# Patient Record
Sex: Female | Born: 1955 | Race: Black or African American | Hispanic: No | Marital: Married | Smoking: Former smoker
Health system: Southern US, Community
[De-identification: ages and names within clinical notes are randomized; demographics above are authoritative.]

## PROBLEM LIST (undated history)

## (undated) DIAGNOSIS — K219 Gastro-esophageal reflux disease without esophagitis: Secondary | ICD-10-CM

## (undated) DIAGNOSIS — F329 Major depressive disorder, single episode, unspecified: Secondary | ICD-10-CM

## (undated) DIAGNOSIS — IMO0001 Reserved for inherently not codable concepts without codable children: Secondary | ICD-10-CM

## (undated) DIAGNOSIS — Z9981 Dependence on supplemental oxygen: Secondary | ICD-10-CM

## (undated) DIAGNOSIS — E785 Hyperlipidemia, unspecified: Secondary | ICD-10-CM

## (undated) DIAGNOSIS — T7840XA Allergy, unspecified, initial encounter: Secondary | ICD-10-CM

## (undated) DIAGNOSIS — F32A Depression, unspecified: Secondary | ICD-10-CM

## (undated) DIAGNOSIS — I8390 Asymptomatic varicose veins of unspecified lower extremity: Secondary | ICD-10-CM

## (undated) HISTORY — DX: Major depressive disorder, single episode, unspecified: F32.9

## (undated) HISTORY — DX: Allergy, unspecified, initial encounter: T78.40XA

## (undated) HISTORY — PX: TUBAL LIGATION: SHX77

## (undated) HISTORY — DX: Depression, unspecified: F32.A

## (undated) HISTORY — DX: Asymptomatic varicose veins of unspecified lower extremity: I83.90

## (undated) HISTORY — DX: Hyperlipidemia, unspecified: E78.5

---

## 2012-11-24 ENCOUNTER — Other Ambulatory Visit: Payer: Self-pay | Admitting: *Deleted

## 2012-11-24 DIAGNOSIS — R609 Edema, unspecified: Secondary | ICD-10-CM

## 2012-11-24 DIAGNOSIS — I803 Phlebitis and thrombophlebitis of lower extremities, unspecified: Secondary | ICD-10-CM

## 2012-11-30 ENCOUNTER — Encounter: Payer: Self-pay | Admitting: Vascular Surgery

## 2012-12-24 ENCOUNTER — Encounter: Payer: Self-pay | Admitting: Vascular Surgery

## 2012-12-25 ENCOUNTER — Ambulatory Visit (INDEPENDENT_AMBULATORY_CARE_PROVIDER_SITE_OTHER): Payer: PRIVATE HEALTH INSURANCE | Admitting: Vascular Surgery

## 2012-12-25 ENCOUNTER — Encounter (INDEPENDENT_AMBULATORY_CARE_PROVIDER_SITE_OTHER): Payer: PRIVATE HEALTH INSURANCE | Admitting: *Deleted

## 2012-12-25 ENCOUNTER — Encounter: Payer: Self-pay | Admitting: Vascular Surgery

## 2012-12-25 VITALS — BP 114/66 | HR 80 | Ht 67.0 in | Wt 276.8 lb

## 2012-12-25 DIAGNOSIS — I872 Venous insufficiency (chronic) (peripheral): Secondary | ICD-10-CM

## 2012-12-25 DIAGNOSIS — R609 Edema, unspecified: Secondary | ICD-10-CM

## 2012-12-25 DIAGNOSIS — I803 Phlebitis and thrombophlebitis of lower extremities, unspecified: Secondary | ICD-10-CM

## 2012-12-25 NOTE — Progress Notes (Signed)
[facility] [place]  Referred by:  Remus Loffler, PA [address] [place], [place] [address]  Reason for referral: Swollen bilateral leg  History of Present Illness  Kelly Donaldson is a 57 y.o. (1956-01-18) female who presents with chief complaint: swollen B leg.  Patient notes, onset of swelling years ago, associated with no obvious trigger.  The patient has had no history of DVT, known history of varicose vein, no history of venous stasis ulcers, no history of  Lymphedema and known history of skin changes in lower legs.  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  There is known family history of venous disorders: mother had same disease.  Pt has had multiple children in the past.  The patient has used knee high compression stockings in the past.  Past Medical History  Diagnosis Date  . Allergy   . Hyperlipidemia   . Depression   . Varicose veins     Past Surgical History  Procedure Date  . Tubal ligation     11/84    History   Social History  . Marital Status: Unknown    Spouse Name: N/A    Number of Children: N/A  . Years of Education: N/A   Occupational History  . Not on file.   Social History Main Topics  . Smoking status: Current Every Day Smoker -- 0.5 packs/day    Types: Cigarettes  . Smokeless tobacco: Never Used     Comment: pt states that she had quit smoking for 10 years and started back 2 years ago 2012  . Alcohol Use: No  . 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 Sexually Active: Not on file   Other Topics Concern  . Not on file   Social History Narrative  . No narrative on file    Family History  Problem Relation Age of Onset  . Hyperlipidemia Mother   . Hypertension Mother   . Diabetes Mother   . Other Mother     varicose veins  . Hyperlipidemia Father   . Hypertension Father   . Hypertension Daughter     Current Outpatient Prescriptions on File Prior to  Visit  Medication Sig Dispense Refill  . cetirizine (ZYRTEC) 10 MG tablet Take 10 mg by mouth daily.      . citalopram (CELEXA) 40 MG tablet Take 40 mg by mouth daily.      . fluticasone (FLONASE) 50 MCG/ACT nasal spray Place 2 sprays into the nose daily.      Marland Kitchen HYDROcodone-acetaminophen (VICODIN ES) 7.5-750 MG per tablet Take 1 tablet by mouth every 6 (six) hours as needed.      . meloxicam (MOBIC) 7.5 MG tablet Take 7.5 mg by mouth daily.      . phentermine 37.5 MG capsule Take 37.5 mg by mouth every morning.      . simvastatin (ZOCOR) 40 MG tablet Take 40 mg by mouth every evening.      . triamcinolone ointment (KENALOG) 0.1 % Apply topically 2 (two) times daily.      . Vitamin D, Ergocalciferol, (DRISDOL) 50000 UNITS CAPS Take 50,000 Units by mouth every 7 (seven) days.      Marland Kitchen azelastine (ASTELIN) 137 MCG/SPRAY nasal spray       . furosemide (LASIX) 20 MG tablet       . ipratropium-albuterol (DUONEB) 0.5-2.5 (3) MG/3ML SOLN       .  traZODone (DESYREL) 100 MG tablet         No Known Allergies  REVIEW OF SYSTEMS:  (Positives checked otherwise negative)  CARDIOVASCULAR:  [ ]  chest pain, [ ]  chest pressure, [ ]  palpitations, [ ]  shortness of breath when laying flat, [ ]  shortness of breath with exertion,   [ ]  pain in feet when walking, [ ]  pain in feet when laying flat, [ ]  history of blood clot in veins (DVT), [x]  history of phlebitis, [x]  swelling in legs, [x]  varicose veins  PULMONARY:  [ ]  productive cough, [ ]  asthma, [ ]  wheezing  NEUROLOGIC:  [ ]  weakness in arms or legs, [ ]  numbness in arms or legs, [ ]  difficulty speaking or slurred speech, [ ]  temporary loss of vision in one eye, [ ]  dizziness  HEMATOLOGIC:  [ ]  bleeding problems, [ ]  problems with blood clotting too easily  MUSCULOSKEL:  [ ]  joint pain, [ ]  joint swelling  GASTROINTEST:  [ ]   Vomiting blood, [ ]   Blood in stool     GENITOURINARY:  [ ]   Burning with urination, [ ]   Blood in urine  PSYCHIATRIC:  [ ]   history of major depression  INTEGUMENTARY:  [ ]  rashes, [ ]  ulcers  CONSTITUTIONAL:  [ ]  fever, [ ]  chills  Physical Examination  Filed Vitals:   12/25/12 1459  BP: 114/66  Pulse: 80  Height: 5\' 7"  (1.702 m)  Weight: 276 lb 12.8 oz (125.556 kg)  SpO2: 96%   Body mass index is 43.35 kg/(m^2).  General: A&O x 3, WD, obese  Head: [place]/AT  Ear/Nose/Throat: Hearing grossly intact, nares w/o erythema or drainage, oropharynx w/o Erythema/Exudate  Eyes: PERRLA, EOMI  Neck: Supple, no nuchal rigidity, no palpable LAD  Pulmonary: Sym exp, good air movt, CTAB, no rales, rhonchi, & wheezing  Cardiac: RRR, Nl S1, S2, no Murmurs, rubs or gallops  Vascular: Vessel Right Left  Radial Palpable Palpable  Ulnar Palpable Palpable  Brachial Palpable Palpable  Carotid Palpable, without bruit Palpable, without bruit  Aorta Not palpable N/A  Femoral Palpable Palpable  Popliteal Not palpable Not palpable  PT Palpable Palpable  DP Palpable Palpable   Gastrointestinal: soft, NTND, -G/R, - HSM, - masses, - CVAT B  Musculoskeletal: M/S 5/5 throughout , Extremities without ischemic changes , bilateral LDS: R>L, bilateral prominent varicosities  Neurologic: CN 2-12 intact , Pain and light touch intact in extremities , Motor exam as listed above  Psychiatric: Judgment intact, Mood & affect appropriate for pt's clinical situation  Dermatologic: See M/S exam for extremity exam, no rashes otherwise noted  Lymph : No Cervical, Axillary, or Inguinal lymphadenopathy   Non-Invasive Vascular Imaging  BLE Venous Insufficiency Duplex (Date: 12/25/12):   RLE: No GSV reflux, ASV proliferation, significant deep reflux  LLE: No GSV reflux, ASV proliferation, significant deep reflux  Outside Studies/Documentation 5 pages of outside documents were reviewed including: outpatient clinic chart.  Medical Decision Making  Kelly Donaldson is a 57 y.o. female who presents with: CVI (C4).   Based on the  patient's history and examination, I recommend: B thigh compression stockings 20-30 mm Hg x 3 months  After 3 month trial, patient will need to follow up for evaluation in Vein Clinic for EVLA.  Stab phlebectomy of ASV in both leg may be necessary also.  Thank you for allowing [place] to participate in this patient's care.  Leonides Sake, MD [facility] [place] Office: [phone] Pager: [phone]  12/25/2012, 3:26 PM

## 2013-03-29 ENCOUNTER — Encounter: Payer: Self-pay | Admitting: Vascular Surgery

## 2013-03-30 ENCOUNTER — Ambulatory Visit: Payer: PRIVATE HEALTH INSURANCE | Admitting: Vascular Surgery

## 2013-05-10 ENCOUNTER — Encounter: Payer: Self-pay | Admitting: Vascular Surgery

## 2013-05-11 ENCOUNTER — Ambulatory Visit: Payer: PRIVATE HEALTH INSURANCE | Admitting: Vascular Surgery

## 2013-07-14 ENCOUNTER — Other Ambulatory Visit: Payer: Self-pay | Admitting: *Deleted

## 2013-07-14 DIAGNOSIS — I739 Peripheral vascular disease, unspecified: Secondary | ICD-10-CM

## 2013-07-26 ENCOUNTER — Encounter: Payer: Self-pay | Admitting: Vascular Surgery

## 2013-07-27 ENCOUNTER — Ambulatory Visit (INDEPENDENT_AMBULATORY_CARE_PROVIDER_SITE_OTHER): Payer: PRIVATE HEALTH INSURANCE | Admitting: Vascular Surgery

## 2013-07-27 ENCOUNTER — Encounter: Payer: Self-pay | Admitting: Vascular Surgery

## 2013-07-27 ENCOUNTER — Encounter (INDEPENDENT_AMBULATORY_CARE_PROVIDER_SITE_OTHER): Payer: PRIVATE HEALTH INSURANCE | Admitting: *Deleted

## 2013-07-27 VITALS — BP 128/72 | HR 86 | Resp 18 | Ht 67.0 in | Wt 265.9 lb

## 2013-07-27 DIAGNOSIS — I739 Peripheral vascular disease, unspecified: Secondary | ICD-10-CM

## 2013-07-27 DIAGNOSIS — I83893 Varicose veins of bilateral lower extremities with other complications: Secondary | ICD-10-CM | POA: Insufficient documentation

## 2013-07-27 NOTE — Progress Notes (Signed)
Problems with Activities of Daily Living Secondary to Leg Pain  1. Kelly Donaldson states she works as a Merchandiser, retail and her job requires her to stand for prolonged periods of time which is difficult due to leg pain.  2. Kelly Donaldson states that cooking, cleaning, shopping are difficult for her due to leg pain.       Failure of  Conservative Therapy:  1. Worn 20-30 mm Hg thigh high compression hose >3 months with no relief of symptoms.  2. Frequently elevates legs-no relief of symptoms  3. Taken Ibuprofen 600 Mg TID with no relief of symptoms.  The patient is here today for continued discussion of her severe chronic venous stasis disease. She had seen Dr. Imogene Burn in January of 2014. She is compliant with her compression garments. She continues to have severe pain in both legs. She does have a great deal of inflammation currently in her left leg particularly.  Past Medical History  Diagnosis Date  . Allergy   . Hyperlipidemia   . Depression   . Varicose veins     History  Substance Use Topics  . Smoking status: Current Every Day Smoker -- 0.50 packs/day    Types: Cigarettes  . Smokeless tobacco: Never Used     Comment: pt states that she had quit smoking for 10 years and started back 2 years ago 2012  . Alcohol Use: No    Family History  Problem Relation Age of Onset  . Hyperlipidemia Mother   . Hypertension Mother   . Diabetes Mother   . Other Mother     varicose veins  . Hyperlipidemia Father   . Hypertension Father   . Hypertension Daughter     No Known Allergies  Current outpatient prescriptions:azelastine (ASTELIN) 137 MCG/SPRAY nasal spray, , Disp: , Rfl: ;  cetirizine (ZYRTEC) 10 MG tablet, Take 10 mg by mouth daily., Disp: , Rfl: ;  citalopram (CELEXA) 40 MG tablet, Take 40 mg by mouth daily., Disp: , Rfl: ;  fluticasone (FLONASE) 50 MCG/ACT nasal spray, Place 2 sprays into the nose daily., Disp: , Rfl: ;  furosemide (LASIX) 20 MG tablet, , Disp: , Rfl:   HYDROcodone-acetaminophen (NORCO) 10-325 MG per tablet, , Disp: , Rfl: ;  ipratropium-albuterol (DUONEB) 0.5-2.5 (3) MG/3ML SOLN, , Disp: , Rfl: ;  meloxicam (MOBIC) 7.5 MG tablet, Take 7.5 mg by mouth daily., Disp: , Rfl: ;  phentermine 37.5 MG capsule, Take 37.5 mg by mouth every morning., Disp: , Rfl: ;  rifampin (RIFADIN) 300 MG capsule, , Disp: , Rfl: ;  simvastatin (ZOCOR) 40 MG tablet, Take 40 mg by mouth every evening., Disp: , Rfl:  sulfamethoxazole-trimethoprim (BACTRIM DS) 800-160 MG per tablet, , Disp: , Rfl: ;  traZODone (DESYREL) 100 MG tablet, , Disp: , Rfl: ;  triamcinolone ointment (KENALOG) 0.1 %, Apply topically 2 (two) times daily., Disp: , Rfl: ;  Vitamin D, Ergocalciferol, (DRISDOL) 50000 UNITS CAPS, Take 50,000 Units by mouth every 7 (seven) days., Disp: , Rfl:  HYDROcodone-acetaminophen (VICODIN ES) 7.5-750 MG per tablet, Take 1 tablet by mouth every 6 (six) hours as needed., Disp: , Rfl:   BP 128/72  Pulse 86  Resp 18  Ht 5\' 7"  (1.702 m)  Wt 265 lb 14.4 oz (120.611 kg)  BMI 41.64 kg/m2  Body mass index is 41.64 kg/(m^2).       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 There is  a great deal of darkening and thickening on her skin bilaterally. She does have erythema on her left calf area. He does have 2+ dorsalis pedis pulses bilaterally  I reviewed her formal venous duplex from January 2014 and reimage her veins with the SonoSite ultrasound myself. She does have scattered varicosities over both thighs. Unfortunately her saphenous vein is very small and not causing any significant venous hypertension.  Impression and plan severe bilateral venous hypertension and venous stasis changes related to severe deep venous reflux. I discussed this at great length with the patient and her husband present. I explained that there is no surgical or correction for her deep venous issues. I have any  critical importance of elevation with her legs above her heart whenever possible and critical importance of graduated compression. She is quite heavy and filled be very difficult for her to continue to wear thigh high compression. I feel that the knee high would be appropriate if she can wear these. Also explained the possibility of wrapping with Ace wrap if she is having too much inflammation to wear these. I did explain that this is not limb threatening. The patient's mother did have severe venous stasis disease and venous stasis ulcers which were chronic so she understands the nature of this. She is asking about disability from this. Certainly she would be benefited by elevation which could be very difficult for her to work a standard job with this.

## 2015-01-11 ENCOUNTER — Ambulatory Visit (INDEPENDENT_AMBULATORY_CARE_PROVIDER_SITE_OTHER): Payer: PRIVATE HEALTH INSURANCE | Admitting: Internal Medicine

## 2015-01-11 ENCOUNTER — Other Ambulatory Visit (INDEPENDENT_AMBULATORY_CARE_PROVIDER_SITE_OTHER): Payer: PRIVATE HEALTH INSURANCE

## 2015-01-11 ENCOUNTER — Ambulatory Visit (INDEPENDENT_AMBULATORY_CARE_PROVIDER_SITE_OTHER)
Admission: RE | Admit: 2015-01-11 | Discharge: 2015-01-11 | Disposition: A | Discharge status: Home / Self Care | Payer: PRIVATE HEALTH INSURANCE | Source: Ambulatory Visit | Attending: Internal Medicine | Admitting: Internal Medicine

## 2015-01-11 ENCOUNTER — Encounter: Payer: Self-pay | Admitting: Internal Medicine

## 2015-01-11 VITALS — BP 100/62 | HR 86 | Ht 68.0 in | Wt 259.0 lb

## 2015-01-11 DIAGNOSIS — Z720 Tobacco use: Secondary | ICD-10-CM | POA: Diagnosis not present

## 2015-01-11 DIAGNOSIS — R059 Cough, unspecified: Secondary | ICD-10-CM | POA: Insufficient documentation

## 2015-01-11 DIAGNOSIS — R05 Cough: Secondary | ICD-10-CM

## 2015-01-11 DIAGNOSIS — R0600 Dyspnea, unspecified: Secondary | ICD-10-CM | POA: Diagnosis not present

## 2015-01-11 DIAGNOSIS — R0902 Hypoxemia: Secondary | ICD-10-CM | POA: Diagnosis not present

## 2015-01-11 DIAGNOSIS — J9611 Chronic respiratory failure with hypoxia: Secondary | ICD-10-CM | POA: Insufficient documentation

## 2015-01-11 DIAGNOSIS — F17210 Nicotine dependence, cigarettes, uncomplicated: Secondary | ICD-10-CM | POA: Insufficient documentation

## 2015-01-11 LAB — BRAIN NATRIURETIC PEPTIDE: Pro B Natriuretic peptide (BNP): 69 pg/mL (ref 0.0–100.0)

## 2015-01-11 LAB — TSH: TSH: 1.51 u[IU]/mL (ref 0.35–4.50)

## 2015-01-11 NOTE — Assessment & Plan Note (Signed)

## 2015-01-11 NOTE — Assessment & Plan Note (Signed)
-   01/11/2015   Walked 2lpm  x one lap @ 185 stopped due to  Sob, nl pace, no desat  Not really clear why she is sob or on 02 at this point so the first step is to check her last admit to [facility] to see what studies were done but likely needds at least a full set of pfts and stop smoking

## 2015-01-11 NOTE — Assessment & Plan Note (Signed)
Adequate control on present rx, reviewed > no change in rx needed  > 2lpm with exertion and hs for now

## 2015-01-11 NOTE — Progress Notes (Signed)
Subjective:    Patient ID: Kelly Donaldson, female    DOB: July 08, 1956,   MRN: [id number]  HPI  37 yobf active smoker on noct 02 2lpm x 2013 and daytime doe x more than [employer] off 02 but losing ground gradually since onset  assoc with leg pain/ swelling = bilaterally already eval [facility] pulmonary doctor = Kelly Donaldson rec wear 02   01/11/2015 1st Kelly Donaldson   Chief Complaint  Patient presents with  . Pulmonary Consult    Referred by Dr. Charm Barges. Pt c/o DOE x 2 yrs worse x 3 months. She states DOE seems worse in the evenings. She gets out of breath walking up stairs or "doing too much". She also c/o cough- prod with clear sputum and has trouble swallowing often.   chronic doe x years much worse x 3 months ssoc with dry daytime cough and mild dysphagia/ also worse leg swelling just using 02 at hs  Prev w/u in [place] "neg" per pt, only rx= 02  No obvious   day to day or daytime variabilty or assoc  cp or chest tightness, subjective wheeze overt sinus or hb symptoms. No unusual exp hx or h/o childhood pna/ asthma or knowledge of premature birth.  Sleeping ok without nocturnal  or early am exacerbation  of respiratory  c/o's or need for noct saba. Also denies any obvious fluctuation of symptoms with weather or environmental changes or other aggravating or alleviating factors except as outlined above   Current Medications, Allergies, Complete Past Medical History, Past Surgical History, Family History, and Social History were reviewed in [employer] record.               Review of Systems  Constitutional: Negative for fever, chills and unexpected weight change.  HENT: Positive for congestion and trouble swallowing. Negative for dental problem, ear pain, nosebleeds, postnasal drip, rhinorrhea, sinus pressure, sneezing, sore throat and voice change.   Eyes: Negative for visual disturbance.  Respiratory: Positive for cough and shortness of  breath. Negative for choking.   Cardiovascular: Negative for chest pain and leg swelling.  Gastrointestinal: Negative for vomiting, abdominal pain and diarrhea.  Genitourinary: Negative for difficulty urinating.  Musculoskeletal: Positive for arthralgias.  Skin: Negative for rash.  Neurological: Negative for tremors, syncope and headaches.  Hematological: Does not bruise/bleed easily.       Objective:   Physical Exam  Obese amb bf nad  Wt Readings from Last 3 Encounters:  01/11/15 259 lb (117.482 kg)  07/27/13 265 lb 14.4 oz (120.611 kg)  12/25/12 276 lb 12.8 oz (125.556 kg)    Vital signs reviewed  HEENT: nl dentition, turbinates, and orophanx. Nl external ear canals without cough reflex   NECK :  without JVD/Nodes/TM/ nl carotid upstrokes bilaterally   LUNGS: no acc muscle use, clear to A and P bilaterally without cough on insp or exp maneuvers   CV:  RRR  no s3 or murmur or increase in P2,  1+ pitting bilateral lower ext edema   ABD:  soft and nontender with nl excursion in the supine position. No bruits or organomegaly, bowel sounds nl  MS:  warm without deformities, calf tenderness, cyanosis or clubbing  SKIN: warm and dry without lesions  Except for chronic venous stasis changes both legs   NEURO:  alert, approp, no deficits   Labs ordered today / images reviewed include:   Lab Results  Component Value Date   TSH 1.51 01/11/2015  Lab Results  Component Value Date   PROBNP 69.0 01/11/2015    cxr 01/11/2015 images reviewed Cardiomegaly/ PH ? ILD  ? Sarcoid   Labs 12/19/14  hct 39.9/  HC03 29     Assessment & Plan:

## 2015-01-11 NOTE — Assessment & Plan Note (Signed)
The most common causes of chronic cough in immunocompetent adults include the following: upper airway cough syndrome (UACS), previously referred to as postnasal drip syndrome (PNDS), which is caused by variety of rhinosinus conditions; (2) asthma; (3) GERD; (4) chronic bronchitis from cigarette smoking or other inhaled environmental irritants; (5) nonasthmatic eosinophilic bronchitis; and (6) bronchiectasis.   These conditions, singly or in combination, have accounted for up to 94% of the causes of chronic cough in prospective studies.   Other conditions have constituted no >6% of the causes in prospective studies These have included bronchogenic carcinoma, chronic interstitial pneumonia, sarcoidosis, left ventricular failure, ACEI-induced cough, and aspiration from a condition associated with pharyngeal dysfunction.    Chronic cough is often simultaneously caused by more than one condition. A single cause has been found from 38 to 82% of the time, multiple causes from 18 to 62%. Multiply caused cough has been the result of three diseases up to 42% of the time.       Based on hx and exam, this is most likely:  Classic Upper airway cough syndrome, so named because it's frequently impossible to sort out how much is  CR/sinusitis with freq throat clearing (which can be related to primary GERD)   vs  causing  secondary (" extra esophageal")  GERD from wide swings in gastric pressure that occur with throat clearing, often  promoting self use of mint and menthol lozenges that reduce the lower esophageal sphincter tone and exacerbate the problem further in a cyclical fashion.   These are the same pts (now being labeled as having "irritable larynx syndrome" by some cough centers) who not infrequently have a history of having failed to tolerate ace inhibitors,  dry powder inhalers or biphosphonates or report having atypical reflux symptoms that don't respond to standard doses of PPI , and are easily confused as  having aecopd or asthma flares by even experienced allergists/ pulmonologists.   The first step is to maximize acid suppression since also reports mild dysphagia

## 2015-01-11 NOTE — Patient Instructions (Addendum)
Please remember to go to the lab and x-ray department downstairs for your tests - we will call you with the results when they are available  We will get the last discharge summary from [facility] to leave 02 off sitting   but wear 2lpm at bedtime and with walking always use = 2lpm  Or goal of over 90% if you have a monitor   The key is to stop smoking completely before smoking completely stops you!   Wear the elastic stockings as much as possible  Try prilosec 20mg   Take 30-60 min before first meal of the day and Pepcid 20 mg one bedtime until  Return (over the counter)     GERD (REFLUX)  is an extremely common cause of respiratory symptoms just like yours , many times with no obvious heartburn at all.    It can be treated with medication, but also with lifestyle changes including avoidance of late meals, excessive alcohol, smoking cessation, and avoid fatty foods, chocolate, peppermint, colas, red wine, and acidic juices such as orange juice.  NO MINT OR MENTHOL PRODUCTS SO NO COUGH DROPS  USE SUGARLESS CANDY INSTEAD (Jolley ranchers or Stover's or Life Savers) or even ice chips will also do - the key is to swallow to prevent all throat clearing. NO OIL BASED VITAMINS - use powdered substitutes.    See Tammy NP in  2 weeks with all your medications, even over the counter meds, separated in two separate bags, the ones you take no matter what vs the ones you stop once you feel better and take only as needed when you feel you need them.   Tammy  will generate for you a new user friendly medication calendar that will put all on the same page re: your medication use.     Without this process, it simply isn't possible to assure that we are providing  your outpatient care  with  the attention to detail we feel you deserve.   If we cannot assure that you're getting that kind of care,  then we cannot manage your problem effectively from this clinic.  Once you have seen Tammy and we  are sure that we're all on the same page with your medication use she will arrange follow up with me.

## 2015-01-12 ENCOUNTER — Telehealth: Payer: Self-pay | Admitting: Internal Medicine

## 2015-01-12 NOTE — Telephone Encounter (Signed)
Results have been explained to patient, pt expressed understanding. Pt is working on getting records from [place] and will also let us know if she has had a CT scan within the last 3 months. Pt does not remember. Nothing further needed.

## 2015-01-12 NOTE — Telephone Encounter (Signed)
Result Note     Call patient : Studies are unremarkable, no change in recs   Result Note     Call pt: Reviewed cxr and suggests need for CTa to sort out if she has not had one in last 3 months - need the [facility] records first then we'll decide  ---   lmomtcb x1

## 2015-01-12 NOTE — Telephone Encounter (Signed)
Pt returning call.Kelly Donaldson ° °

## 2015-01-26 ENCOUNTER — Encounter: Payer: Self-pay | Admitting: Adult Health

## 2015-02-08 ENCOUNTER — Encounter: Payer: Self-pay | Admitting: Adult Health

## 2015-02-08 ENCOUNTER — Ambulatory Visit (INDEPENDENT_AMBULATORY_CARE_PROVIDER_SITE_OTHER): Payer: PRIVATE HEALTH INSURANCE | Admitting: Adult Health

## 2015-02-08 ENCOUNTER — Other Ambulatory Visit: Payer: Self-pay | Admitting: Adult Health

## 2015-02-08 VITALS — BP 124/84 | HR 67 | Temp 98.2°F | Ht 68.0 in | Wt 253.4 lb

## 2015-02-08 DIAGNOSIS — R059 Cough, unspecified: Secondary | ICD-10-CM

## 2015-02-08 DIAGNOSIS — R0600 Dyspnea, unspecified: Secondary | ICD-10-CM

## 2015-02-08 DIAGNOSIS — R0902 Hypoxemia: Secondary | ICD-10-CM

## 2015-02-08 DIAGNOSIS — R05 Cough: Secondary | ICD-10-CM

## 2015-02-08 NOTE — Patient Instructions (Signed)
We are setting you up for a CT chest  Stop Arthrotec.  Check with family doctor regarding why and what you take Rifampin for???  follow up Dr. Wert  In 4 weeks with PFT  Please wear Oxygen 2l/m with walking and At bedtime   Work on not smoking.  Please contact office for sooner follow up if symptoms do not improve or worsen or seek emergency care    

## 2015-02-09 ENCOUNTER — Telehealth: Payer: Self-pay | Admitting: Internal Medicine

## 2015-02-09 NOTE — Telephone Encounter (Signed)
Spoke to [place] [email or web address]  company no precert required [employer] for [employer] and called ap ct Tobe Sos

## 2015-02-10 ENCOUNTER — Ambulatory Visit (HOSPITAL_COMMUNITY): Payer: PRIVATE HEALTH INSURANCE

## 2015-02-14 NOTE — Assessment & Plan Note (Signed)
We are setting you up for a CT chest  Stop Arthrotec.  Check with family doctor regarding why and what you take Rifampin for???  follow up Dr. [name]  In 4 weeks with PFT  Please wear Oxygen 2l/m with walking and At bedtime   Work on not smoking.  Please contact office for sooner follow up if symptoms do not improve or worsen or seek emergency care

## 2015-02-14 NOTE — Progress Notes (Signed)
Subjective:    Patient ID: Kelly Donaldson, female    DOB: 04-Jul-1956,   MRN: [id number]  HPI  61 yobf active smoker on noct 02 2lpm x 2013 and daytime doe x more than superstore off 02 but losing ground gradually since onset  assoc with leg pain/ swelling = bilaterally already eval [facility] ([facility] pulmonary doctor = Orson Aloe rec wear 02   01/11/2015 1st [facility] office visit/ Wert   Chief Complaint  Patient presents with  . Pulmonary Consult    Referred by Dr. Charm Barges. Pt c/o DOE x 2 yrs worse x 3 months. She states DOE seems worse in the evenings. She gets out of breath walking up stairs or "doing too much". She also c/o cough- prod with clear sputum and has trouble swallowing often.   chronic doe x years much worse x 3 months ssoc with dry daytime cough and mild dysphagia/ also worse leg swelling just using 02 at hs  Prev w/u in [place] "neg" per pt, only rx= 02 >>labs and xr   02/08/15 Follow up : Dspnea/DOE  Patient returns for follow-up and medication review At all her medications organize them into a medication count with patient education Patient does not seem to know her medications well. She has right rifampin but is unclear if she is taking this or not. May be Taking both mobic and Arthrotec Chest x-ray last visit showed vascular congestion. BNP was normal. This last visit. Patient says that her breathing is unchanged. She still get short of breath with activity Patient is supposed to be on oxygen with walking. However did not bring her oxygen in today. She denies any chest pain, orthopnea, PND, or increased leg swelling. She is on phentermine for weight loss. She continues to smoke, smoking cessation discussed.     Current Medications, Allergies, Complete Past Medical History, Past Surgical History, Family History, and Social History were reviewed in [employer] record.           Review of Systems  Constitutional: Negative for fever, chills  and unexpected weight change.  HENT:  Negative for dental problem, ear pain, nosebleeds, postnasal drip, rhinorrhea, sinus pressure, sneezing, sore throat and voice change.   Eyes: Negative for visual disturbance.  Respiratory: Positive for cough and shortness of breath. Negative for choking.   Cardiovascular: Negative for chest pain and leg swelling.  Gastrointestinal: Negative for vomiting, abdominal pain and diarrhea.  Genitourinary: Negative for difficulty urinating.  Musculoskeletal: Positive for arthralgias.  Skin: Negative for rash.  Neurological: Negative for tremors, syncope and headaches.  Hematological: Does not bruise/bleed easily.       Objective:   Physical Exam  Obese amb bf nad   Vital signs reviewed  HEENT: nl dentition, turbinates, and orophanx. Nl external ear canals without cough reflex   NECK :  without JVD/Nodes/TM/ nl carotid upstrokes bilaterally   LUNGS: no acc muscle use, clear to A and P bilaterally without cough on insp or exp maneuvers   CV:  RRR  no s3 or murmur or increase in P2,  1+ pitting bilateral lower ext edema   ABD:  soft and nontender with nl excursion in the supine position. No bruits or organomegaly, bowel sounds nl  MS:  warm without deformities, calf tenderness, cyanosis or clubbing  SKIN: warm and dry without lesions  Except for chronic venous stasis changes both legs   NEURO:  alert, approp, no deficits   Labs ordered today / images reviewed include:  Lab Results  Component Value Date   TSH 1.51 01/11/2015    Lab Results  Component Value Date   PROBNP 69.0 01/11/2015    cxr 01/11/2015 images reviewed Cardiomegaly/ PH ? ILD  ? Sarcoid   Labs 12/19/14  hct 39.9/  HC03 29     Assessment & Plan:

## 2015-02-14 NOTE — Assessment & Plan Note (Signed)
We are setting you up for a CT chest  follow up Dr. [name]  In 4 weeks with PFT  Please wear Oxygen 2l/m with walking and At bedtime   Work on not smoking.  Please contact office for sooner follow up if symptoms do not improve or worsen or seek emergency care

## 2015-02-16 NOTE — Addendum Note (Signed)
Addended by: Boone Master E on: 02/16/2015 02:45 PM   Modules accepted: Orders, Medications

## 2015-03-17 ENCOUNTER — Encounter: Payer: Self-pay | Admitting: Internal Medicine

## 2015-03-17 NOTE — Progress Notes (Signed)
 This encounter was created in error - please disregard.

## 2015-03-20 ENCOUNTER — Ambulatory Visit (INDEPENDENT_AMBULATORY_CARE_PROVIDER_SITE_OTHER): Payer: PRIVATE HEALTH INSURANCE | Admitting: Internal Medicine

## 2015-03-20 ENCOUNTER — Encounter (INDEPENDENT_AMBULATORY_CARE_PROVIDER_SITE_OTHER): Payer: Self-pay

## 2015-03-20 ENCOUNTER — Encounter: Payer: Self-pay | Admitting: Internal Medicine

## 2015-03-20 VITALS — BP 118/60 | HR 79 | Ht 67.0 in | Wt 254.0 lb

## 2015-03-20 DIAGNOSIS — R0600 Dyspnea, unspecified: Secondary | ICD-10-CM

## 2015-03-20 DIAGNOSIS — J9611 Chronic respiratory failure with hypoxia: Secondary | ICD-10-CM | POA: Diagnosis not present

## 2015-03-20 DIAGNOSIS — R059 Cough, unspecified: Secondary | ICD-10-CM

## 2015-03-20 DIAGNOSIS — Z720 Tobacco use: Secondary | ICD-10-CM

## 2015-03-20 DIAGNOSIS — R05 Cough: Secondary | ICD-10-CM

## 2015-03-20 DIAGNOSIS — R0902 Hypoxemia: Secondary | ICD-10-CM

## 2015-03-20 DIAGNOSIS — F17210 Nicotine dependence, cigarettes, uncomplicated: Secondary | ICD-10-CM

## 2015-03-20 LAB — PULMONARY FUNCTION TEST
DL/VA % pred: 35 %
DL/VA: 1.84 ml/min/mmHg/L
DLCO UNC: 7.39 ml/min/mmHg
DLCO unc % pred: 26 %
FEF 25-75 Post: 1.22 L/sec
FEF 25-75 Pre: 1.55 L/sec
FEF2575-%Change-Post: -21 %
FEF2575-%Pred-Post: 52 %
FEF2575-%Pred-Pre: 66 %
FEV1-%CHANGE-POST: -1 %
FEV1-%PRED-POST: 74 %
FEV1-%Pred-Pre: 75 %
FEV1-Post: 1.79 L
FEV1-Pre: 1.81 L
FEV1FVC-%Change-Post: -1 %
FEV1FVC-%Pred-Pre: 97 %
FEV6-%Change-Post: 0 %
FEV6-%Pred-Post: 78 %
FEV6-%Pred-Pre: 78 %
FEV6-POST: 2.31 L
FEV6-PRE: 2.31 L
FEV6FVC-%Change-Post: 0 %
FEV6FVC-%PRED-POST: 102 %
FEV6FVC-%PRED-PRE: 102 %
FVC-%CHANGE-POST: 0 %
FVC-%PRED-PRE: 76 %
FVC-%Pred-Post: 76 %
FVC-Post: 2.32 L
FVC-Pre: 2.33 L
PRE FEV6/FVC RATIO: 100 %
Post FEV1/FVC ratio: 77 %
Post FEV6/FVC ratio: 99 %
Pre FEV1/FVC ratio: 78 %
RV % PRED: 38 %
RV: 0.81 L
TLC % PRED: 66 %
TLC: 3.63 L

## 2015-03-20 NOTE — Assessment & Plan Note (Addendum)

## 2015-03-20 NOTE — Patient Instructions (Addendum)
Try flonase to see if the bleeding goes away  The key is to stop smoking completely before smoking completely stops you!   You will need to be referred to a pulmonary doctor in [place] and  I would prefer [facility] but defer this issue to Dr Charm Barges - I would be happy to see you back once you have a specific diagnosis as to why you require 02 but can't do further outpatient testing on you in [place] due to your insurance restrictions  Please see patient coordinator before you leave today  to schedule [facility] to check you for  elibility for a portable concentrator  Pulmonary follow up will be as needed pending your evaluation in [place] if your wish to return here for convenience once we have a firm diagnosis

## 2015-03-20 NOTE — Assessment & Plan Note (Addendum)
-  Echo 09/15/14  LAE mildly dilated, as are RV and RA  - 01/11/2015   Walked 2lpm  x one lap @ 185 stopped due to  Sob, nl pace, no desat - 03/20/2015   Walked 2lpm x one lap @ 185 stopped due to sob/ desat corrected on 4lpm   rec as of 03/20/2015 2lpm 24/7 except 4lpm walking outside of house  Unfortunately cannot shed further light on her dx ? Could this be sarcoid with assoc cardiopathy or PH?      Need to complete the w/u starting with  [facility] but insurance in [place] already turned down letting up do the w/u here.  I had an extended discussion with the patient reviewing all relevant studies completed to date and  lasting 15 to 20 minutes of a 25 minute visit on the following ongoing concerns:  1) she needs a definitive w/u and clearly this is not a simple case and should probably best be seen in a tertiary center like [facility] or wherever Dr Charm Barges sends her pts in [place] for this kind of care  Needs [facility] and probably [facility] to complete the w/u   2) reviewing the records we have here it appears we never received anything from from [facility] will try again.   3) her walking sats indicate she needs 2lpm 24/7 and 4lpm with walking and it is not practical for her to haul around her present 02 tanks all day nor do I want her to become housebound so needs eval for POC/ most portable system available though her insurance     Each maintenance medication was reviewed in detail including most importantly the difference between maintenance and as needed and under what circumstances the prns are to be used.  Please see instructions for details which were reviewed in writing and the patient given a copy.

## 2015-03-20 NOTE — Progress Notes (Signed)
Subjective:    Patient ID: Kelly Donaldson, female    DOB: Sep 13, 1956,   MRN: [id number]    Brief patient profile:  19 yobf active smoker on noct 02 2lpm x 2013 and daytime doe x more than superstore off 02 but losing ground gradually since onset  assoc with leg pain/ swelling = bilaterally already eval [place] pulmonary doctor = Kelly Donaldson rec wear 02 no dx so referred to pulmonary clinic 01/11/15 by Kelly Donaldson   History of Present Illness  01/11/2015 1st [facility] office visit/ Kelly Donaldson   Chief Complaint  Patient presents with  . Pulmonary Consult    Referred by Dr. Charm Donaldson. Pt c/o DOE x 2 yrs worse x 3 months. She states DOE seems worse in the evenings. She gets out of breath walking up stairs or "doing too much". She also c/o cough- prod with clear sputum and has trouble swallowing often.   chronic doe x years much worse x 3 months assoc with dry daytime cough and mild dysphagia/ also worse leg swelling just using 02 at hs  Prev w/u in [place] "neg" per pt, only rx= 02 rec  We will request  the last discharge summary from [facility] to leave 02 off sitting   but wear 2lpm at bedtime and with walking always use = 2lpm  Or goal of over 90% if you have a monitor  The key is to stop smoking completely before smoking completely stops you!  Wear the elastic stockings as much as possible Try prilosec 20mg   Take 30-60 min before first meal of the day and Pepcid 20 mg one bedtime until  Return (over the counter)  GERD diet  02/08/15 Follow up : NP re sob/ resp failure  Patient returns for follow-up and medication review At all her medications organize them into a medication count with patient education Patient does not seem to know her medications well. She has right rifampin but is unclear if she is taking this or not. May be Taking both mobic and Arthrotec Chest x-ray last visit showed vascular congestion. BNP was normal. This last visit. Patient says that her breathing is  unchanged. She still gets short of breath with activity Patient is supposed to be on oxygen with walking. However did not bring her oxygen in today. She denies any chest pain, orthopnea, PND, or increased leg swelling. She is on phentermine for weight loss. She continues to smoke, smoking cessation discussed. rec We are setting you up for a CT chest  Stop Arthrotec.  Check with family doctor regarding why and what you take Rifampin for???  follow up Dr. 04/10/15  In 4 weeks with PFT  Please wear Oxygen 2l/m with walking and At bedtime   Work on not smoking.    03/20/2015 f/u ov/Kelly Donaldson re: chronic resp failure ? Etiology  Chief Complaint  Patient presents with  . Follow-up    PFT today; SOB w/activity; cough at times since quit smoking; having nose bleeds even though she has water bulb on O2; states she is having RT upper side pain since she feels like she swallowed the blood from this am.  rx  02 s dx x 2013 sev times a week wakes up with bloody nose and coughing up blood  never during the day and total amt < 1 tbsp Her insurance apparently won't allow 2014 to order imaging studies outside of her network in [place].  No obvious day to day or daytime variabilty or assoc  chest tightness, subjective wheeze overt sinus or hb symptoms. No unusual exp hx or h/o childhood pna/ asthma or knowledge of premature birth.  Sleeping ok without nocturnal  or early am exacerbation  of respiratory  c/o's or need for noct saba. Also denies any obvious fluctuation of symptoms with weather or environmental changes or other aggravating or alleviating factors except as outlined above   Current Medications, Allergies, Complete Past Medical History, Past Surgical History, Family History, and Social History were reviewed in [employer] record.  ROS  The following are not active complaints unless bolded sore throat, dysphagia, dental problems, itching, sneezing,  nasal congestion with epistaxis  or  excess/ purulent secretions, ear ache,   fever, chills, sweats, unintended wt loss, classically pleuritic or exertional cp, hemoptysis,  orthopnea pnd or leg swelling, presyncope, palpitations, heartburn, abdominal pain, anorexia, nausea, vomiting, diarrhea  or change in bowel or urinary habits, change in stools or urine, dysuria,hematuria,  rash, arthralgias, visual complaints, headache, numbness weakness or ataxia or problems with walking or coordination,  change in mood/affect or memory.                        Objective:   Physical Exam  Obese amb bf nad   Wt Readings from Last 3 Encounters:  03/20/15 254 lb (115.214 kg)  02/08/15 253 lb 6.4 oz (114.941 kg)  01/11/15 259 lb (117.482 kg)    Vital signs reviewed   HEENT: nl dentition, turbinates, and orophanx. Nl external ear canals without cough reflex   NECK :  without JVD/Nodes/TM/ nl carotid upstrokes bilaterally   LUNGS: no acc muscle use, clear to A and P bilaterally without cough on insp or exp maneuvers   CV:  RRR  no s3 or murmur or increase in P2,  1+ pitting bilateral lower ext edema   ABD:  soft and nontender with nl excursion in the supine position. No bruits or organomegaly, bowel sounds nl  MS:  warm without deformities, calf tenderness, cyanosis or clubbing  SKIN: warm and dry without lesions  Except for chronic venous stasis changes both legs   NEURO:  alert, approp, no deficits   Labs   reviewed include:   Lab Results  Component Value Date   TSH 1.51 01/11/2015    Lab Results  Component Value Date   PROBNP 69.0 01/11/2015    cxr 01/11/2015 images reviewed Cardiomegaly/ PH ? ILD  ? Sarcoid   Labs 12/19/14  hct 39.9/  HC03 29     Assessment & Plan:

## 2015-03-20 NOTE — Progress Notes (Signed)
PFT done today. 

## 2015-03-23 ENCOUNTER — Telehealth: Payer: Self-pay | Admitting: *Deleted

## 2015-03-23 DIAGNOSIS — R0600 Dyspnea, unspecified: Secondary | ICD-10-CM

## 2015-03-23 NOTE — Telephone Encounter (Signed)
Spoke with the pt and notified of recs per Dr [name]  She verbalized understanding  Will d/c Phentermine  I have sent orders to [facility] for vq scan and rt heart cath  This note faxed to Dr. Charm Barges

## 2015-03-23 NOTE — Telephone Encounter (Signed)
-----   Message from Nyoka Cowden, MD sent at 03/23/2015  1:52 PM EDT ----- Let pt know I reviewed her records from prev pulmonary w/u 12/17/2012 and agree with Dr Orson Aloe:  rec  W/u for pulmonary hypertension and d/c phentermine if still taking it.  At the very least needs v/q scan and Right heart cath which we could do here if her [place] covers having this or let Dr Charm Barges refer her up there for same. (send copy of this note to Dr Silvana Newness office when complete)

## 2015-03-27 ENCOUNTER — Telehealth: Payer: Self-pay | Admitting: Internal Medicine

## 2015-03-27 NOTE — Telephone Encounter (Signed)
lmtcb x1 for pt. Per [employer] documentation this order was faxed to [facility].

## 2015-03-28 NOTE — Telephone Encounter (Signed)
Done under dx of chronic resp failure in bold / italics

## 2015-03-28 NOTE — Telephone Encounter (Signed)
Per 03/20/2015 OV notes Under dx Chronic Resp Failure : 3) her walking sats indicate she needs 2lpm 24/7 and 4lpm with walking and it is not practical for her to haul around her present 02 tanks all day nor do I want her to become housebound so needs eval for POC/ most portable system available though her insurance   Called [facility] [address] [place] and phones rolled over to [employer] answering service.  LM x 1 for someone to call me back - states message will be sent to Lindy at [place] location.

## 2015-03-28 NOTE — Telephone Encounter (Signed)
Spoke with pt, explained that we are working on getting documentation in place to approve poc through insurance.  She is aware.

## 2015-03-28 NOTE — Telephone Encounter (Signed)
Spoke with Kelly Donaldson at [facility] in [place], states that the ov note has to state why the provider is wanting the patient to try a POC instead of big 02 tanks for insurance to consider a titration.    MW are you ok with editing this patient's last ov note for this purpose?  Thanks!

## 2015-03-29 NOTE — Telephone Encounter (Signed)
Called [facility], [place] -- Phone # [phone] Spoke with Deana - not sure where we got the name Lindy as they do not have anyone by that name at any of their [place] locations.  Requests to OV notes be faxed to her with statement of medical necc for POC vs Tanks.  This has been faxed to [facility] at [phone] Nothing further needed.

## 2015-03-30 ENCOUNTER — Telehealth: Payer: Self-pay | Admitting: Internal Medicine

## 2015-03-30 ENCOUNTER — Encounter (HOSPITAL_COMMUNITY): Payer: Self-pay

## 2015-03-30 ENCOUNTER — Emergency Department (HOSPITAL_COMMUNITY)
Admission: EM | Admit: 2015-03-30 | Discharge: 2015-03-30 | Disposition: A | Discharge status: Home / Self Care | Payer: No Typology Code available for payment source | Attending: Emergency Medicine | Admitting: Emergency Medicine

## 2015-03-30 ENCOUNTER — Emergency Department (HOSPITAL_COMMUNITY): Payer: No Typology Code available for payment source

## 2015-03-30 DIAGNOSIS — Z79899 Other long term (current) drug therapy: Secondary | ICD-10-CM | POA: Insufficient documentation

## 2015-03-30 DIAGNOSIS — J449 Chronic obstructive pulmonary disease, unspecified: Secondary | ICD-10-CM

## 2015-03-30 DIAGNOSIS — F329 Major depressive disorder, single episode, unspecified: Secondary | ICD-10-CM | POA: Insufficient documentation

## 2015-03-30 DIAGNOSIS — Z8679 Personal history of other diseases of the circulatory system: Secondary | ICD-10-CM | POA: Insufficient documentation

## 2015-03-30 DIAGNOSIS — Z720 Tobacco use: Secondary | ICD-10-CM | POA: Insufficient documentation

## 2015-03-30 DIAGNOSIS — Z791 Long term (current) use of non-steroidal anti-inflammatories (NSAID): Secondary | ICD-10-CM | POA: Insufficient documentation

## 2015-03-30 DIAGNOSIS — J441 Chronic obstructive pulmonary disease with (acute) exacerbation: Secondary | ICD-10-CM | POA: Insufficient documentation

## 2015-03-30 DIAGNOSIS — E785 Hyperlipidemia, unspecified: Secondary | ICD-10-CM | POA: Insufficient documentation

## 2015-03-30 DIAGNOSIS — Z0279 Encounter for issue of other medical certificate: Secondary | ICD-10-CM | POA: Diagnosis not present

## 2015-03-30 DIAGNOSIS — R0602 Shortness of breath: Secondary | ICD-10-CM

## 2015-03-30 LAB — CBC WITH DIFFERENTIAL/PLATELET
Basophils Absolute: 0 10*3/uL (ref 0.0–0.1)
Basophils Relative: 0 % (ref 0–1)
EOS ABS: 0.2 10*3/uL (ref 0.0–0.7)
Eosinophils Relative: 3 % (ref 0–5)
HCT: 36.3 % (ref 36.0–46.0)
HEMOGLOBIN: 11.4 g/dL — AB (ref 12.0–15.0)
Lymphocytes Relative: 27 % (ref 12–46)
Lymphs Abs: 2.4 10*3/uL (ref 0.7–4.0)
MCH: 27.5 pg (ref 26.0–34.0)
MCHC: 31.4 g/dL (ref 30.0–36.0)
MCV: 87.7 fL (ref 78.0–100.0)
MONOS PCT: 4 % (ref 3–12)
Monocytes Absolute: 0.4 10*3/uL (ref 0.1–1.0)
NEUTROS ABS: 5.7 10*3/uL (ref 1.7–7.7)
Neutrophils Relative %: 66 % (ref 43–77)
Platelets: 194 10*3/uL (ref 150–400)
RBC: 4.14 MIL/uL (ref 3.87–5.11)
RDW: 14.9 % (ref 11.5–15.5)
WBC: 8.7 10*3/uL (ref 4.0–10.5)

## 2015-03-30 LAB — HEPATIC FUNCTION PANEL
ALT: 13 U/L (ref 0–35)
AST: 18 U/L (ref 0–37)
Albumin: 3.3 g/dL — ABNORMAL LOW (ref 3.5–5.2)
Alkaline Phosphatase: 74 U/L (ref 39–117)
BILIRUBIN DIRECT: 0.1 mg/dL (ref 0.0–0.5)
Indirect Bilirubin: 0.1 mg/dL — ABNORMAL LOW (ref 0.3–0.9)
Total Bilirubin: 0.2 mg/dL — ABNORMAL LOW (ref 0.3–1.2)
Total Protein: 7 g/dL (ref 6.0–8.3)

## 2015-03-30 LAB — TROPONIN I: Troponin I: <0.03 ng/mL (ref <0.031)

## 2015-03-30 LAB — BASIC METABOLIC PANEL
ANION GAP: 8 (ref 5–15)
BUN: 16 mg/dL (ref 6–23)
CO2: 28 mmol/L (ref 19–32)
Calcium: 8.8 mg/dL (ref 8.4–10.5)
Chloride: 103 mmol/L (ref 96–112)
Creatinine, Ser: 0.89 mg/dL (ref 0.50–1.10)
GFR, EST AFRICAN AMERICAN: 81 mL/min — AB (ref >90)
GFR, EST NON AFRICAN AMERICAN: 70 mL/min — AB (ref >90)
Glucose, Bld: 88 mg/dL (ref 70–99)
POTASSIUM: 4.1 mmol/L (ref 3.5–5.1)
Sodium: 139 mmol/L (ref 135–145)

## 2015-03-30 MED ORDER — ALBUTEROL SULFATE HFA 108 (90 BASE) MCG/ACT IN AERS: 1.0000 | INHALATION_SPRAY | Freq: Four times a day (QID) | RESPIRATORY_TRACT | Status: DC | PRN

## 2015-03-30 MED ORDER — IPRATROPIUM-ALBUTEROL 0.5-2.5 (3) MG/3ML IN SOLN
3.0000 mL | Freq: Once | RESPIRATORY_TRACT | Status: AC
  Administered 2015-03-30: 3 mL via RESPIRATORY_TRACT
  Filled 2015-03-30: qty 3

## 2015-03-30 MED ORDER — PREDNISONE 50 MG PO TABS
60.0000 mg | ORAL_TABLET | Freq: Once | ORAL | Status: AC
  Administered 2015-03-30: 60 mg via ORAL
  Filled 2015-03-30 (×2): qty 1

## 2015-03-30 MED ORDER — HYDROCODONE-ACETAMINOPHEN 5-325 MG PO TABS
1.0000 | ORAL_TABLET | Freq: Once | ORAL | Status: AC
  Administered 2015-03-30: 1 via ORAL
  Filled 2015-03-30: qty 1

## 2015-03-30 MED ORDER — PREDNISONE 50 MG PO TABS
60.0000 mg | ORAL_TABLET | Freq: Once | ORAL | Status: DC
  Filled 2015-03-30 (×2): qty 1

## 2015-03-30 MED ORDER — PREDNISONE 10 MG PO TABS: 20.0000 mg | ORAL_TABLET | Freq: Every day | ORAL | Status: DC

## 2015-03-30 MED ORDER — ALBUTEROL SULFATE HFA 108 (90 BASE) MCG/ACT IN AERS
2.0000 | INHALATION_SPRAY | RESPIRATORY_TRACT | Status: DC | PRN
  Administered 2015-03-30: 2 via RESPIRATORY_TRACT
  Filled 2015-03-30: qty 6.7

## 2015-03-30 MED ORDER — IOHEXOL 350 MG/ML SOLN
100.0000 mL | Freq: Once | INTRAVENOUS | Status: AC | PRN
Start: 2015-03-30 — End: 2015-03-30
  Administered 2015-03-30: 100 mL via INTRAVENOUS

## 2015-03-30 NOTE — ED Provider Notes (Addendum)
CSN: [id number]     Arrival date & time 03/30/15  1551 History   First MD Initiated Contact with Patient 03/30/15 1558     Chief Complaint  Patient presents with  . Shortness of Breath  . Chest Pain     (Consider location/radiation/quality/duration/timing/severity/associated sxs/prior Treatment) Patient is a 59 y.o. female presenting with shortness of breath and chest pain. The history is provided by the patient (the pt complains of sob.  pt has seen pulmonologist and she is on home o2).  Shortness of Breath Severity:  Moderate Onset quality:  Gradual Timing:  Constant Progression:  Waxing and waning Chronicity:  Recurrent Context: activity   Associated symptoms: chest pain   Associated symptoms: no abdominal pain, no cough, no headaches and no rash   Chest Pain Associated symptoms: shortness of breath   Associated symptoms: no abdominal pain, no back pain, no cough, no fatigue and no headache     Past Medical History  Diagnosis Date  . Allergy   . Hyperlipidemia   . Depression   . Varicose veins    Past Surgical History  Procedure Laterality Date  . Tubal ligation      11/84   Family History  Problem Relation Age of Onset  . Hyperlipidemia Mother   . Hypertension Mother   . Diabetes Mother   . Other Mother     varicose veins  . Hyperlipidemia Father   . Hypertension Father   . Hypertension Daughter   . Emphysema Father     smoked  . Asthma Sister   . Clotting disorder Daughter   . Breast cancer Maternal Grandmother   . Ovarian cancer Maternal Aunt    History  Substance Use Topics  . Smoking status: Light Tobacco Smoker -- 0.25 packs/day for 17 years    Types: Cigarettes  . Smokeless tobacco: Never Used  . Alcohol Use: No   OB History    No data available     Review of Systems  Constitutional: Negative for appetite change and fatigue.  HENT: Negative for congestion, ear discharge and sinus pressure.   Eyes: Negative for discharge.  Respiratory:  Positive for shortness of breath. Negative for cough.   Cardiovascular: Positive for chest pain.  Gastrointestinal: Negative for abdominal pain and diarrhea.  Genitourinary: Negative for frequency and hematuria.  Musculoskeletal: Negative for back pain.  Skin: Negative for rash.  Neurological: Negative for seizures and headaches.  Psychiatric/Behavioral: Negative for hallucinations.      Allergies  Gabapentin  Home Medications   Prior to Admission medications   Medication Sig Start Date End Date Taking? Authorizing Provider  cetirizine (ZYRTEC) 10 MG tablet Take 10 mg by mouth daily.   Yes Historical Provider, MD  citalopram (CELEXA) 20 MG tablet Take 20 mg by mouth daily.   Yes Historical Provider, MD  dextromethorphan-guaiFENesin (MUCINEX DM) 30-600 MG per 12 hr tablet Take 1 tablet by mouth 2 (two) times daily as needed for cough.   Yes Historical Provider, MD  famotidine (PEPCID) 20 MG tablet Take 20 mg by mouth at bedtime.   Yes Historical Provider, MD  furosemide (LASIX) 20 MG tablet Take 20 mg by mouth daily.   Yes Historical Provider, MD  HYDROcodone-acetaminophen (NORCO) 10-325 MG per tablet Take 1-2 tablets by mouth every 6 (six) hours as needed for moderate pain or severe pain.    Yes Historical Provider, MD  meloxicam (MOBIC) 7.5 MG tablet Take 7.5 mg by mouth daily.   Yes Historical Provider, MD  nicotine (NICODERM CQ - DOSED IN MG/24 HOURS) 14 mg/24hr patch Place 14 mg onto the skin daily.   Yes Historical Provider, MD  omeprazole (PRILOSEC) 20 MG capsule Take 20 mg by mouth daily before breakfast.   Yes Historical Provider, MD  pravastatin (PRAVACHOL) 20 MG tablet Take 20 mg by mouth daily.   Yes Historical Provider, MD  traZODone (DESYREL) 100 MG tablet Take 100-200 mg by mouth at bedtime. 1-2 at bedtime at needed   Yes Historical Provider, MD  Vitamin D, Ergocalciferol, (DRISDOL) 50000 UNITS CAPS capsule Take 50,000 Units by mouth every Monday.    Yes Historical  Provider, MD  albuterol (PROVENTIL HFA;VENTOLIN HFA) 108 (90 BASE) MCG/ACT inhaler Inhale 1-2 puffs into the lungs every 6 (six) hours as needed for wheezing or shortness of breath. 03/30/15   Bethann Berkshire, MD   BP 111/77 mmHg  Pulse 64  Temp(Src) 98.3 F (36.8 C) (Oral)  Resp 10  Ht 5\' 7"  (1.702 m)  Wt 251 lb (113.853 kg)  BMI 39.30 kg/m2  SpO2 94% Physical Exam  Constitutional: She is oriented to person, place, and time. She appears well-developed.  HENT:  Head: Normocephalic.  Eyes: Conjunctivae and EOM are normal. No scleral icterus.  Neck: Neck supple. No thyromegaly present.  Cardiovascular: Normal rate and regular rhythm.  Exam reveals no gallop and no friction rub.   No murmur heard. Pulmonary/Chest: No stridor. She has no wheezes. She has rales. She exhibits no tenderness.  Abdominal: She exhibits no distension. There is no tenderness. There is no rebound.  Musculoskeletal: Normal range of motion. She exhibits no edema.  Lymphadenopathy:    She has no cervical adenopathy.  Neurological: She is oriented to person, place, and time. She exhibits normal muscle tone. Coordination normal.  Skin: No rash noted. No erythema.  Psychiatric: She has a normal mood and affect. Her behavior is normal.    ED Course  Procedures (including critical care time) Labs Review Labs Reviewed  CBC WITH DIFFERENTIAL/PLATELET - Abnormal; Notable for the following:    Hemoglobin 11.4 (*)    All other components within normal limits  BASIC METABOLIC PANEL - Abnormal; Notable for the following:    GFR calc non Af Amer 70 (*)    GFR calc Af Amer 81 (*)    All other components within normal limits  HEPATIC FUNCTION PANEL - Abnormal; Notable for the following:    Albumin 3.3 (*)    Total Bilirubin 0.2 (*)    Indirect Bilirubin 0.1 (*)    All other components within normal limits  TROPONIN I    Imaging Review Ct Angio Chest Pe W/cm &/or Wo Cm  03/30/2015   CLINICAL DATA:  Chest pain and  shortness of breath.  EXAM: CT ANGIOGRAPHY CHEST WITH CONTRAST  TECHNIQUE: Multidetector CT imaging of the chest was performed using the standard protocol during bolus administration of intravenous contrast. Multiplanar CT image reconstructions and MIPs were obtained to evaluate the vascular anatomy.  CONTRAST:  04/01/2015 OMNIPAQUE IOHEXOL 350 MG/ML SOLN  COMPARISON:  Chest x-ray dated 03/30/2015 and CT angiogram of the chest dated 10/12/2012  FINDINGS: There are no pulmonary emboli. The patient has chronic interstitial and obstructive lung disease. There small mediastinal and hilar lymph nodes which are slightly more prominent than on the prior study. There is a new 2.5 x 1.7 cm lymph node with a calcified center in the right axilla. There is chronic right peritracheal adenopathy best seen on image 9 of series 4,  measuring 18 x 18 mm, essentially unchanged since 2013. The visualized portion of the upper abdomen is normal. No significant osseous abnormality.  Borderline cardiomegaly.  No effusions.  Review of the MIP images confirms the above findings.  IMPRESSION: 1. No pulmonary emboli. 2. Slightly increased slight mediastinal and hilar adenopathy with a new enlarged lymph node in the right axilla. 3. Chronic interstitial and obstructive lung disease. 4. The possibility of sarcoidosis should be considered.   Electronically Signed   By: Francene Boyers M.D.   On: 03/30/2015 18:00   Dg Chest Portable 1 View  03/30/2015   CLINICAL DATA:  Shortness of breath, chest pain.  EXAM: PORTABLE CHEST - 1 VIEW  COMPARISON:  January 11, 2015.  FINDINGS: Stable cardiomediastinal silhouette. No pneumothorax or significant pleural effusion is noted. Stable bilateral perihilar and basilar interstitial densities are noted most consistent with scarring, but superimposed pulmonary edema cannot be excluded. Bony thorax appears intact.  IMPRESSION: Stable bilateral perihilar and basilar interstitial densities are noted most consistent  with scarring, but superimposed edema cannot be excluded.   Electronically Signed   By: Lupita Raider, M.D.   On: 03/30/2015 16:35     EKG Interpretation None      MDM   Final diagnoses:  SOB (shortness of breath)  Chronic obstructive pulmonary disease, unspecified COPD, unspecified chronic bronchitis type    Ct chest suggest sarcoid.  Pt to follow up with pul.  tx with albuterol and prednisone    Bethann Berkshire, MD 03/30/15 [id number]  Bethann Berkshire, MD 03/30/15 2043

## 2015-03-30 NOTE — Telephone Encounter (Signed)
Called and spoke to pt. Pt stated she is wanting to go to ED because of increase in WOB and SOB, chest congestion, midsternal CP, and wheezing. Pt stated she will go to [facility] hospital's ED.  Pt has VQ scan tomorrow (4/22)- please advise if to reschedule or leave it booked. Thanks.

## 2015-03-30 NOTE — ED Notes (Signed)
Pt c/o chest pain and sob x 10 min.  Reports ran out of her oxygen ago.  Pt says feels like lungs hurt.  Pt reports having nose bleeds frequently for 1 month.

## 2015-03-30 NOTE — Telephone Encounter (Signed)
VQ and CXR cancelled by [facility].  Patient notified.  Patient is on her way to the hospital now in [place].  Nothing further needed.

## 2015-03-30 NOTE — Telephone Encounter (Signed)
Cancel both vq and cxr

## 2015-03-30 NOTE — ED Notes (Signed)
Patient to CT.

## 2015-03-30 NOTE — Telephone Encounter (Signed)
Cancel it  

## 2015-03-30 NOTE — Discharge Instructions (Signed)
Call dr. Rolin Barry office tomorrow and let them know you had a ct chest today and dr. [name] needs to discuss the results with you.  Follow up with your family md as planned

## 2015-03-31 ENCOUNTER — Ambulatory Visit (HOSPITAL_COMMUNITY): Payer: PRIVATE HEALTH INSURANCE

## 2015-04-06 ENCOUNTER — Telehealth: Payer: Self-pay | Admitting: Internal Medicine

## 2015-04-06 DIAGNOSIS — R9389 Abnormal findings on diagnostic imaging of other specified body structures: Secondary | ICD-10-CM

## 2015-04-06 DIAGNOSIS — J9611 Chronic respiratory failure with hypoxia: Secondary | ICD-10-CM

## 2015-04-06 DIAGNOSIS — D869 Sarcoidosis, unspecified: Secondary | ICD-10-CM

## 2015-04-06 NOTE — Telephone Encounter (Signed)
Order needs to be placed for IR bx under u/s for surg path only

## 2015-04-06 NOTE — Telephone Encounter (Signed)
LMTC x 1 for pt Pt also called requesting CT results that were done in ED on 03/30/15

## 2015-04-06 NOTE — Telephone Encounter (Signed)
Scan ok except for new node in axilla  Needs bx of axillary node by Ct > I R can do under u/s

## 2015-04-06 NOTE — Telephone Encounter (Signed)
Pt returning call [phone]

## 2015-04-06 NOTE — Telephone Encounter (Signed)
Order placed for biposy Will send to Dr [name] as Lorain Childes.   Please advise Dr [name] if okay to switch DME as patient is not pleased with service at [facility]. Thanks.

## 2015-04-06 NOTE — Telephone Encounter (Signed)
Please advise if order needs to be placed. If so, will IR order labs?

## 2015-04-06 NOTE — Telephone Encounter (Signed)
Called and spoke to pt. Pt is requesting a change in DME companies from [facility] to [employer]. Pt is unsatisfied with [employer] d/t her concentrator not being serviced in over a year after asking [employer] to do so, [employer] has delayed brining pt her tanks to the house for a week and pt had to travel tot he office to get more, and pt has not heard anything from [employer] regarding the POC eval-order was placed 03/20/15 (all per pt).   Pt also requesting MW review CTa that was done at the ED on 4/21.   MW please advise if ok for pt to switch DMEs and CT scan. Thanks.

## 2015-04-06 NOTE — Telephone Encounter (Signed)
Will attach this message to phone note that is already started on 04/06/15.  Message closed.

## 2015-04-07 ENCOUNTER — Telehealth (HOSPITAL_COMMUNITY): Payer: Self-pay | Admitting: Vascular Surgery

## 2015-04-07 DIAGNOSIS — D869 Sarcoidosis, unspecified: Secondary | ICD-10-CM | POA: Insufficient documentation

## 2015-04-07 NOTE — Telephone Encounter (Signed)
lmtcb for pt.   Order placed.  

## 2015-04-07 NOTE — Telephone Encounter (Signed)
Called and spoke to pt. Informed pt of the results and recs per MW (bx, echo, and referral to card). Orders placed. Pt verbalized understanding and denied any further questions or concerns at this time.

## 2015-04-07 NOTE — Telephone Encounter (Signed)
Ok with me Also needs Echo with bubble study to r/o shunt and referral to Dr Jearld Pies for Right heart Cath

## 2015-04-07 NOTE — Telephone Encounter (Signed)
Pt returned call  [phone]

## 2015-04-07 NOTE — Telephone Encounter (Signed)
Dawn from Dr.Wert called they would like to schedule this pt for a right heart cath w/ [facility].Marland Kitchen Please advise

## 2015-04-13 ENCOUNTER — Ambulatory Visit (HOSPITAL_COMMUNITY): Payer: No Typology Code available for payment source | Attending: Internal Medicine

## 2015-04-13 ENCOUNTER — Other Ambulatory Visit (HOSPITAL_COMMUNITY): Payer: PRIVATE HEALTH INSURANCE

## 2015-04-13 ENCOUNTER — Other Ambulatory Visit: Payer: Self-pay

## 2015-04-13 DIAGNOSIS — J9611 Chronic respiratory failure with hypoxia: Secondary | ICD-10-CM | POA: Diagnosis not present

## 2015-04-13 DIAGNOSIS — I272 Other secondary pulmonary hypertension: Secondary | ICD-10-CM | POA: Insufficient documentation

## 2015-04-13 NOTE — Progress Notes (Signed)
2D Echo with bubbles completed. 04/13/2015

## 2015-04-14 ENCOUNTER — Other Ambulatory Visit: Payer: Self-pay | Admitting: *Deleted

## 2015-04-14 ENCOUNTER — Encounter (HOSPITAL_COMMUNITY): Payer: Self-pay | Admitting: *Deleted

## 2015-04-14 NOTE — Telephone Encounter (Signed)
RHC sch for [facility] 5/12, pt aware and insturctions reviewed with her via phone

## 2015-04-18 ENCOUNTER — Other Ambulatory Visit: Payer: Self-pay | Admitting: Radiology

## 2015-04-19 ENCOUNTER — Ambulatory Visit (HOSPITAL_COMMUNITY)
Admission: RE | Admit: 2015-04-19 | Discharge: 2015-04-19 | Disposition: A | Discharge status: Home / Self Care | Payer: No Typology Code available for payment source | Source: Ambulatory Visit | Attending: Internal Medicine | Admitting: Internal Medicine

## 2015-04-19 ENCOUNTER — Telehealth: Payer: Self-pay | Admitting: Internal Medicine

## 2015-04-19 ENCOUNTER — Telehealth (HOSPITAL_COMMUNITY): Payer: Self-pay | Admitting: Cardiology

## 2015-04-19 ENCOUNTER — Encounter (HOSPITAL_COMMUNITY): Payer: Self-pay

## 2015-04-19 DIAGNOSIS — R590 Localized enlarged lymph nodes: Secondary | ICD-10-CM | POA: Insufficient documentation

## 2015-04-19 DIAGNOSIS — R9389 Abnormal findings on diagnostic imaging of other specified body structures: Secondary | ICD-10-CM

## 2015-04-19 DIAGNOSIS — R599 Enlarged lymph nodes, unspecified: Secondary | ICD-10-CM | POA: Insufficient documentation

## 2015-04-19 HISTORY — DX: Reserved for inherently not codable concepts without codable children: IMO0001

## 2015-04-19 HISTORY — DX: Gastro-esophageal reflux disease without esophagitis: K21.9

## 2015-04-19 HISTORY — DX: Dependence on supplemental oxygen: Z99.81

## 2015-04-19 LAB — CBC
HCT: 34.9 % — ABNORMAL LOW (ref 36.0–46.0)
HEMOGLOBIN: 10.7 g/dL — AB (ref 12.0–15.0)
MCH: 27.2 pg (ref 26.0–34.0)
MCHC: 30.7 g/dL (ref 30.0–36.0)
MCV: 88.8 fL (ref 78.0–100.0)
Platelets: 206 10*3/uL (ref 150–400)
RBC: 3.93 MIL/uL (ref 3.87–5.11)
RDW: 15.6 % — ABNORMAL HIGH (ref 11.5–15.5)
WBC: 8 10*3/uL (ref 4.0–10.5)

## 2015-04-19 LAB — PROTIME-INR
INR: 0.98 (ref 0.00–1.49)
PROTHROMBIN TIME: 13.1 s (ref 11.6–15.2)

## 2015-04-19 LAB — APTT: aPTT: 32 seconds (ref 24–37)

## 2015-04-19 MED ORDER — MIDAZOLAM HCL 2 MG/2ML IJ SOLN
INTRAMUSCULAR | Status: AC | PRN
  Administered 2015-04-19: 1 mg via INTRAVENOUS
  Administered 2015-04-19 (×3): 0.5 mg via INTRAVENOUS
  Administered 2015-04-19: 1 mg via INTRAVENOUS

## 2015-04-19 MED ORDER — SODIUM CHLORIDE 0.9 % IV SOLN
INTRAVENOUS | Status: DC
  Administered 2015-04-19: 500 mL via INTRAVENOUS

## 2015-04-19 MED ORDER — MIDAZOLAM HCL 2 MG/2ML IJ SOLN
INTRAMUSCULAR | Status: AC
  Filled 2015-04-19: qty 6

## 2015-04-19 MED ORDER — FENTANYL CITRATE (PF) 100 MCG/2ML IJ SOLN
INTRAMUSCULAR | Status: AC
Start: 2015-04-19 — End: 2015-04-19
  Filled 2015-04-19: qty 4

## 2015-04-19 MED ORDER — FENTANYL CITRATE (PF) 100 MCG/2ML IJ SOLN
INTRAMUSCULAR | Status: AC | PRN
  Administered 2015-04-19 (×2): 25 ug via INTRAVENOUS

## 2015-04-19 NOTE — Telephone Encounter (Signed)
Spoke to pt's husband. Informed him of the time (1100) the cath is on 5/12 and that is will be at [facility] and to arrive an hour before. Pt's husband verbalized understanding and denied any further questions or concerns at this time.

## 2015-04-19 NOTE — Procedures (Signed)
Interventional Radiology Procedure Note  Procedure: US guided core bx of RIGHT axillary node  Complications: None  Estimated Blood Loss: 0  Recommendations: - Bedrest x 1 hr   Signed,  Sterling Big, MD

## 2015-04-19 NOTE — H&P (Signed)
Chief Complaint: "I'm here to get a biopsy done"  Referring Physician(s): Wert,Michael B  History of Present Illness: Kelly Donaldson is a 59 y.o. female, former smoker, with prior history of dyspnea and chest discomfort and CT angiography of the chest on 03/30/15 which revealed  increased mediastinal and hilar adenopathy and a new enlarged lymph node in the right axilla.  Possibility of sarcoidosis was raised. Patient states she's had a biopsy of the right axillary lymph node several years ago at [facility] which was benign.She denies history of cancer.She presents today for ultrasound-guided  biopsy of the right axillary lymph node .  Past Medical History  Diagnosis Date  . Allergy   . Hyperlipidemia   . Depression   . Varicose veins   . GERD (gastroesophageal reflux disease)   . Shortness of breath dyspnea   . Oxygen dependent     Past Surgical History  Procedure Laterality Date  . Tubal ligation      11/84    Allergies: Gabapentin and Latex  Medications: Prior to Admission medications   Medication Sig Start Date End Date Taking? Authorizing Provider  albuterol (PROVENTIL HFA;VENTOLIN HFA) 108 (90 BASE) MCG/ACT inhaler Inhale 1-2 puffs into the lungs every 6 (six) hours as needed for wheezing or shortness of breath. 03/30/15  Yes Bethann Berkshire, MD  cetirizine (ZYRTEC) 10 MG tablet Take 10 mg by mouth daily.   Yes Historical Provider, MD  citalopram (CELEXA) 20 MG tablet Take 20 mg by mouth every morning.    Yes Historical Provider, MD  famotidine (PEPCID) 20 MG tablet Take 20 mg by mouth daily as needed for heartburn.    Yes Historical Provider, MD  furosemide (LASIX) 20 MG tablet Take 20 mg by mouth daily.   Yes Historical Provider, MD  HYDROcodone-acetaminophen (NORCO) 10-325 MG per tablet Take 1-2 tablets by mouth every 6 (six) hours as needed for moderate pain or severe pain.    Yes Historical Provider, MD  meloxicam (MOBIC) 7.5 MG tablet Take 7.5 mg by mouth  daily.   Yes Historical Provider, MD  omeprazole (PRILOSEC) 20 MG capsule Take 20 mg by mouth daily as needed (for acid reflux).    Yes Historical Provider, MD  pravastatin (PRAVACHOL) 20 MG tablet Take 20 mg by mouth daily.   Yes Historical Provider, MD  traZODone (DESYREL) 100 MG tablet Take 100-200 mg by mouth at bedtime.    Yes Historical Provider, MD  Vitamin D, Ergocalciferol, (DRISDOL) 50000 UNITS CAPS capsule Take 50,000 Units by mouth every Monday.    Yes Historical Provider, MD  predniSONE (DELTASONE) 10 MG tablet Take 2 tablets (20 mg total) by mouth daily. Patient not taking: Reported on 04/14/2015 03/30/15   Bethann Berkshire, MD    Family History  Problem Relation Age of Onset  . Hyperlipidemia Mother   . Hypertension Mother   . Diabetes Mother   . Other Mother     varicose veins  . Hyperlipidemia Father   . Hypertension Father   . Hypertension Daughter   . Emphysema Father     smoked  . Asthma Sister   . Clotting disorder Daughter   . Breast cancer Maternal Grandmother   . Ovarian cancer Maternal Aunt     History   Social History  . Marital Status: Married    Spouse Name: N/A  . Number of Children: N/A  . Years of Education: N/A   Occupational History  . Unemployed    Social History Main  Topics  . Smoking status: Light Tobacco Smoker -- 0.25 packs/day for 17 years    Types: Cigarettes  . Smokeless tobacco: Never Used  . Alcohol Use: No  . 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 Sexual Activity: Not on file   Other Topics Concern  . None   Social History Narrative      Review of Systems  Constitutional: Positive for fatigue. Negative for fever and chills.  HENT: Positive for nosebleeds.   Respiratory: Negative for cough.        Occ dyspnea  Gastrointestinal: Negative for nausea, vomiting, abdominal pain and blood in stool.  Genitourinary: Negative for dysuria and hematuria.  Musculoskeletal: Negative for  back pain.  Neurological: Negative for headaches.    Vital Signs: BP 133/72 mmHg  Pulse 69  Temp(Src) 98.1 F (36.7 C) (Oral)  Resp 18  SpO2 100%  Physical Exam  Constitutional: She is oriented to person, place, and time. She appears well-developed and well-nourished.  Cardiovascular: Normal rate and regular rhythm.   Pulmonary/Chest: Effort normal.  Few fine crackles right base, left clear.  Abdominal: Soft. Bowel sounds are normal. There is no tenderness.  Musculoskeletal: Normal range of motion.  Neurological: She is alert and oriented to person, place, and time.    Imaging: Ct Angio Chest Pe W/cm &/or Wo Cm  03/30/2015   CLINICAL DATA:  Chest pain and shortness of breath.  EXAM: CT ANGIOGRAPHY CHEST WITH CONTRAST  TECHNIQUE: Multidetector CT imaging of the chest was performed using the standard protocol during bolus administration of intravenous contrast. Multiplanar CT image reconstructions and MIPs were obtained to evaluate the vascular anatomy.  CONTRAST:  OMNIPAQUE IOHEXOL 350 MG/ML SOLN  COMPARISON:  Chest x-ray dated 03/30/2015 and CT angiogram of the chest dated 10/12/2012  FINDINGS: There are no pulmonary emboli. The patient has chronic interstitial and obstructive lung disease. There small mediastinal and hilar lymph nodes which are slightly more prominent than on the prior study. There is a new 2.5 x 1.7 cm lymph node with a calcified center in the right axilla. There is chronic right peritracheal adenopathy best seen on image 9 of series 4, measuring 18 x 18 mm, essentially unchanged since 2013. The visualized portion of the upper abdomen is normal. No significant osseous abnormality.  Borderline cardiomegaly.  No effusions.  Review of the MIP images confirms the above findings.  IMPRESSION: 1. No pulmonary emboli. 2. Slightly increased slight mediastinal and hilar adenopathy with a new enlarged lymph node in the right axilla. 3. Chronic interstitial and obstructive lung  disease. 4. The possibility of sarcoidosis should be considered.   Electronically Signed   By: Francene Boyers M.D.   On: 03/30/2015 18:00   Dg Chest Portable 1 View  03/30/2015   CLINICAL DATA:  Shortness of breath, chest pain.  EXAM: PORTABLE CHEST - 1 VIEW  COMPARISON:  January 11, 2015.  FINDINGS: Stable cardiomediastinal silhouette. No pneumothorax or significant pleural effusion is noted. Stable bilateral perihilar and basilar interstitial densities are noted most consistent with scarring, but superimposed pulmonary edema cannot be excluded. Bony thorax appears intact.  IMPRESSION: Stable bilateral perihilar and basilar interstitial densities are noted most consistent with scarring, but superimposed edema cannot be excluded.   Electronically Signed   By: Lupita Raider, M.D.   On: 03/30/2015 16:35    Labs:  CBC:  Recent Labs  03/30/15 1610 04/19/15 1130  WBC 8.7 8.0  HGB 11.4* 10.7*  HCT 36.3 34.9*  PLT 194 206    COAGS:  Recent Labs  04/19/15 1130  INR 0.98  APTT 32    BMP:  Recent Labs  03/30/15 1610  NA 139  K 4.1  CL 103  CO2 28  GLUCOSE 88  BUN 16  CALCIUM 8.8  CREATININE 0.89  GFRNONAA 70*  GFRAA 81*    LIVER FUNCTION TESTS:  Recent Labs  03/30/15 1610  BILITOT 0.2*  AST 18  ALT 13  ALKPHOS 74  PROT 7.0  ALBUMIN 3.3*    TUMOR MARKERS: No results for input(s): AFPTM, CEA, CA199, CHROMGRNA in the last 8760 hours.  Assessment and Plan: Kelly Donaldson is a 58 y.o. female, former smoker, with prior history of dyspnea and chest discomfort and CT angiography of the chest on 03/30/15 which revealed  increased mediastinal and hilar adenopathy and a new enlarged lymph node in the right axilla.  Possibility of sarcoidosis was raised. Patient states she's had a biopsy of the right axillary lymph node several years ago at [facility] which was benign.She denies history of cancer.She presents today for ultrasound-guided  biopsy of the right axillary  lymph node Risks and benefits discussed with the patient/husband including, but not limited to bleeding, infection, damage to adjacent structures or low yield requiring additional tests. All of the patient's questions were answered, patient is agreeable to proceed. Consent signed and in chart.     Signed: Chinita Pester 04/19/2015, 12:27 PM   I spent a total of 20 minutes face to face in clinical consultation, greater than 50% of which was counseling/coordinating care for ultrasound-guided right axillary lymph node biopsy

## 2015-04-19 NOTE — Discharge Instructions (Signed)
Needle Biopsy °Care After °These instructions give you information on caring for yourself after your procedure. Your doctor may also give you more specific instructions. Call your doctor if you have any problems or questions after your procedure. °HOME CARE °· Rest for 4 hours after your biopsy, except for getting up to go to the bathroom or as told. °· Keep the places where the needles were put in clean and dry. °¨ Do not put powder or lotion on the sites. °¨ Do not shower until 24 hours after the test. Remove all bandages (dressings) before showering. °¨ Remove all bandages at least once every day. Gently clean the sites with soap and water. Keep putting a new bandage on until the skin is closed. °Finding out the results of your test °Ask your doctor when your test results will be ready. Make sure you follow up and get the test results. °GET HELP RIGHT AWAY IF:  °· You have shortness of breath or trouble breathing. °· You have pain or cramping in your belly (abdomen). °· You feel sick to your stomach (nauseous) or throw up (vomit). °· Any of the places where the needles were put in: °¨ Are puffy (swollen) or red. °¨ Are sore or hot to the touch. °¨ Are draining yellowish-white fluid (pus). °¨ Are bleeding after 10 minutes of pressing down on the site. Have someone keep pressing on any place that is bleeding until you see a doctor. °· You have any unusual pain that will not stop. °· You have a fever. °If you go to the emergency room, tell the nurse that you had a biopsy. Take this paper with you to show the nurse. °MAKE SURE YOU:  °· Understand these instructions. °· Will watch your condition. °· Will get help right away if you are not doing well or get worse. °Document Released: 11/07/2008 Document Revised: 02/17/2012 Document Reviewed: 11/07/2008 °ExitCare® Patient Information ©2015 [employer]. This information is not intended to replace advice given to you by your health care provider. Make sure you discuss  any questions you have with your health care provider. °Conscious Sedation °Sedation is the use of medicines to promote relaxation and relieve discomfort and anxiety. Conscious sedation is a type of sedation. Under conscious sedation you are less alert than normal but are still able to respond to instructions or stimulation. Conscious sedation is used during short medical and dental procedures. It is milder than deep sedation or general anesthesia and allows you to return to your regular activities sooner.  °LET YOUR HEALTH CARE PROVIDER KNOW ABOUT:  °· Any allergies you have. °· All medicines you are taking, including vitamins, herbs, eye drops, creams, and over-the-counter medicines. °· Use of steroids (by mouth or creams). °· Previous problems you or members of your family have had with the use of anesthetics. °· Any blood disorders you have. °· Previous surgeries you have had. °· Medical conditions you have. °· Possibility of pregnancy, if this applies. °· Use of cigarettes, alcohol, or illegal drugs. °RISKS AND COMPLICATIONS °Generally, this is a safe procedure. However, as with any procedure, problems can occur. Possible problems include: °· Oversedation. °· Trouble breathing on your own. You may need to have a breathing tube until you are awake and breathing on your own. °· Allergic reaction to any of the medicines used for the procedure. °BEFORE THE PROCEDURE °· You may have blood tests done. These tests can help show how well your kidneys and liver are working. They can also   show how well your blood clots. °· A physical exam will be done.   °· Only take medicines as directed by your health care provider. You may need to stop taking medicines (such as blood thinners, aspirin, or nonsteroidal anti-inflammatory drugs) before the procedure.   °· Do not eat or drink at least 6 hours before the procedure or as directed by your health care provider. °· Arrange for a responsible adult, family member, or friend to  take you home after the procedure. He or she should stay with you for at least 24 hours after the procedure, until the medicine has worn off. °PROCEDURE  °· An intravenous (IV) catheter will be inserted into one of your veins. Medicine will be able to flow directly into your body through this catheter. You may be given medicine through this tube to help prevent pain and help you relax. °· The medical or dental procedure will be done. °AFTER THE PROCEDURE °· You will stay in a recovery area until the medicine has worn off. Your blood pressure and pulse will be checked.   °·  Depending on the procedure you had, you may be allowed to go home when you can tolerate liquids and your pain is under control. °Document Released: 08/20/2001 Document Revised: 11/30/2013 Document Reviewed: 08/02/2013 °ExitCare® Patient Information ©2015 [employer]. This information is not intended to replace advice given to you by your health care provider. Make sure you discuss any questions you have with your health care provider. ° °

## 2015-04-19 NOTE — Telephone Encounter (Signed)
Pt scheduled for RHC on 04/20/15 with [name] Cpt code- 82800 With pts current insurance- [employer] No pre cert required- call [phone]

## 2015-04-20 ENCOUNTER — Ambulatory Visit (HOSPITAL_COMMUNITY)
Admission: RE | Admit: 2015-04-20 | Discharge: 2015-04-20 | Disposition: A | Discharge status: Home / Self Care | Payer: No Typology Code available for payment source | Source: Ambulatory Visit | Attending: Cardiology | Admitting: Cardiology

## 2015-04-20 ENCOUNTER — Encounter (HOSPITAL_COMMUNITY)
Admission: RE | Disposition: A | Discharge status: Home / Self Care | Payer: PRIVATE HEALTH INSURANCE | Source: Ambulatory Visit | Attending: Cardiology

## 2015-04-20 DIAGNOSIS — F17210 Nicotine dependence, cigarettes, uncomplicated: Secondary | ICD-10-CM | POA: Insufficient documentation

## 2015-04-20 DIAGNOSIS — I272 Other secondary pulmonary hypertension: Secondary | ICD-10-CM | POA: Insufficient documentation

## 2015-04-20 DIAGNOSIS — I270 Primary pulmonary hypertension: Secondary | ICD-10-CM | POA: Diagnosis not present

## 2015-04-20 HISTORY — PX: CARDIAC CATHETERIZATION: SHX172

## 2015-04-20 LAB — BASIC METABOLIC PANEL
ANION GAP: 8 (ref 5–15)
BUN: 11 mg/dL (ref 6–20)
CHLORIDE: 105 mmol/L (ref 101–111)
CO2: 26 mmol/L (ref 22–32)
Calcium: 8.8 mg/dL — ABNORMAL LOW (ref 8.9–10.3)
Creatinine, Ser: 0.88 mg/dL (ref 0.44–1.00)
GFR calc Af Amer: >60 mL/min (ref >60)
GFR calc non Af Amer: >60 mL/min (ref >60)
Glucose, Bld: 97 mg/dL (ref 65–99)
Potassium: 4.5 mmol/L (ref 3.5–5.1)
Sodium: 139 mmol/L (ref 135–145)

## 2015-04-20 SURGERY — RIGHT HEART CATH
Anesthesia: LOCAL

## 2015-04-20 MED ORDER — SODIUM CHLORIDE 0.9 % IV SOLN: 250.0000 mL | INTRAVENOUS | Status: DC | PRN

## 2015-04-20 MED ORDER — SODIUM CHLORIDE 0.9 % IJ SOLN: 3.0000 mL | INTRAMUSCULAR | Status: DC | PRN

## 2015-04-20 MED ORDER — ASPIRIN 81 MG PO CHEW
CHEWABLE_TABLET | ORAL | Status: AC
  Administered 2015-04-20: 81 mg
  Filled 2015-04-20: qty 1

## 2015-04-20 MED ORDER — ACETAMINOPHEN 325 MG PO TABS: 650.0000 mg | ORAL_TABLET | ORAL | Status: DC | PRN

## 2015-04-20 MED ORDER — SODIUM CHLORIDE 0.9 % IV SOLN
INTRAVENOUS | Status: DC
  Administered 2015-04-20: 12:00:00 via INTRAVENOUS

## 2015-04-20 MED ORDER — SODIUM CHLORIDE 0.9 % IJ SOLN: 3.0000 mL | Freq: Two times a day (BID) | INTRAMUSCULAR | Status: DC

## 2015-04-20 MED ORDER — HEPARIN (PORCINE) IN NACL 2-0.9 UNIT/ML-% IJ SOLN
INTRAMUSCULAR | Status: AC
  Filled 2015-04-20: qty 500

## 2015-04-20 MED ORDER — LIDOCAINE HCL (PF) 1 % IJ SOLN
INTRAMUSCULAR | Status: AC
  Filled 2015-04-20: qty 30

## 2015-04-20 MED ORDER — MIDAZOLAM HCL 2 MG/2ML IJ SOLN: INTRAMUSCULAR | Status: DC | PRN

## 2015-04-20 MED ORDER — FENTANYL CITRATE (PF) 100 MCG/2ML IJ SOLN
INTRAMUSCULAR | Status: AC
  Filled 2015-04-20: qty 2

## 2015-04-20 MED ORDER — FENTANYL CITRATE (PF) 100 MCG/2ML IJ SOLN: INTRAMUSCULAR | Status: DC | PRN

## 2015-04-20 MED ORDER — MIDAZOLAM HCL 2 MG/2ML IJ SOLN
INTRAMUSCULAR | Status: AC
  Filled 2015-04-20: qty 2

## 2015-04-20 MED ORDER — ONDANSETRON HCL 4 MG/2ML IJ SOLN: 4.0000 mg | Freq: Four times a day (QID) | INTRAMUSCULAR | Status: DC | PRN

## 2015-04-20 SURGICAL SUPPLY — 13 items
CATH BALLN WEDGE 5F 110CM (CATHETERS) ×2 IMPLANT
CATH INFINITI 5 FR MPA2 (CATHETERS) IMPLANT
CATH SWAN GANZ 7F STRAIGHT (CATHETERS) IMPLANT
GUIDEWIRE .025 260CM (WIRE) ×2 IMPLANT
KIT HEART RIGHT NAMIC (KITS) ×2 IMPLANT
PACK CARDIAC CATHETERIZATION (CUSTOM PROCEDURE TRAY) ×2 IMPLANT
SHEATH FAST CATH BRACH 5F 5CM (SHEATH) ×4 IMPLANT
SHEATH PINNACLE 5F 10CM (SHEATH) IMPLANT
SHEATH PINNACLE 7F 10CM (SHEATH) IMPLANT
TRANSDUCER W/STOPCOCK (MISCELLANEOUS) ×2 IMPLANT
WIRE EMERALD 3MM-J .035X150CM (WIRE) IMPLANT
WIRE HI TORQ VERSACORE-J 145CM (WIRE) IMPLANT
WIRE SAFE-T 1.5MM-J .035X260CM (WIRE) IMPLANT

## 2015-04-20 NOTE — Discharge Instructions (Signed)
Wound Care Wound care helps prevent pain and infection.   I HOME CARE   Only take medicine as told by your doctor.  Clean the wound daily with mild soap and water.  Remove bandage 24 hours after procedure  Change the bandage if it gets wet, dirty, or starts to smell.  Take showers. Do not take baths, swim, or do anything that puts your wound under water.  Rest and raise (elevate) the wound until the pain and puffiness (swelling) are better.  Keep all doctor visits as told. GET HELP RIGHT AWAY IF:   Yellowish-white fluid (pus) comes from the wound.  Medicine does not lessen your pain.  There is a red streak going away from the wound.  You have a fever. MAKE SURE YOU:   Understand these instructions.  Will watch your condition.  Will get help right away if you are not doing well or get worse. Document Released: 09/03/2008 Document Revised: 02/17/2012 Document Reviewed: 03/31/2011 [facility] Patient Information 2015 [place], [place]. This information is not intended to replace advice given to you by your health care provider. Make sure you discuss any questions you have with your health care provider.

## 2015-04-20 NOTE — H&P (Signed)
Please see Dr Thurston Hole note from 03/20/15, no significant changes since then.   Exam unchanged.    Patient is an active smoker.  She is being worked up for possible sarcoidosis (abnormal CT chest ).  She has a dilated RV and pulmonary hypertension by echo, will assess today with RHC.

## 2015-04-21 ENCOUNTER — Encounter: Payer: Self-pay | Admitting: Internal Medicine

## 2015-04-21 ENCOUNTER — Encounter (HOSPITAL_COMMUNITY): Payer: Self-pay | Admitting: Cardiology

## 2015-04-21 DIAGNOSIS — I2721 Secondary pulmonary arterial hypertension: Secondary | ICD-10-CM | POA: Insufficient documentation

## 2015-04-21 MED FILL — Lidocaine HCl Local Preservative Free (PF) Inj 1%: INTRAMUSCULAR | Qty: 30 | Status: AC

## 2015-04-21 MED FILL — Heparin Sodium (Porcine) 2 Unit/ML in Sodium Chloride 0.9%: INTRAMUSCULAR | Qty: 500 | Status: AC

## 2015-04-21 MED FILL — Fentanyl Citrate Preservative Free (PF) Inj 100 MCG/2ML: INTRAMUSCULAR | Qty: 2 | Status: AC

## 2015-04-21 MED FILL — Midazolam HCl Inj 2 MG/2ML (Base Equivalent): INTRAMUSCULAR | Qty: 2 | Status: AC

## 2015-05-05 ENCOUNTER — Ambulatory Visit (HOSPITAL_COMMUNITY)
Admission: RE | Admit: 2015-05-05 | Discharge: 2015-05-05 | Disposition: A | Discharge status: Home / Self Care | Payer: No Typology Code available for payment source | Source: Ambulatory Visit | Attending: Cardiology | Admitting: Cardiology

## 2015-05-05 ENCOUNTER — Encounter (HOSPITAL_COMMUNITY): Payer: Self-pay | Admitting: *Deleted

## 2015-05-05 VITALS — BP 100/56 | HR 66 | Wt 257.5 lb

## 2015-05-05 DIAGNOSIS — J9610 Chronic respiratory failure, unspecified whether with hypoxia or hypercapnia: Secondary | ICD-10-CM | POA: Insufficient documentation

## 2015-05-05 DIAGNOSIS — Z720 Tobacco use: Secondary | ICD-10-CM

## 2015-05-05 DIAGNOSIS — Z79899 Other long term (current) drug therapy: Secondary | ICD-10-CM | POA: Diagnosis not present

## 2015-05-05 DIAGNOSIS — I272 Other secondary pulmonary hypertension: Secondary | ICD-10-CM | POA: Diagnosis not present

## 2015-05-05 DIAGNOSIS — I872 Venous insufficiency (chronic) (peripheral): Secondary | ICD-10-CM | POA: Insufficient documentation

## 2015-05-05 DIAGNOSIS — I2721 Secondary pulmonary arterial hypertension: Secondary | ICD-10-CM

## 2015-05-05 DIAGNOSIS — D869 Sarcoidosis, unspecified: Secondary | ICD-10-CM | POA: Diagnosis not present

## 2015-05-05 DIAGNOSIS — Z87891 Personal history of nicotine dependence: Secondary | ICD-10-CM | POA: Insufficient documentation

## 2015-05-05 DIAGNOSIS — K219 Gastro-esophageal reflux disease without esophagitis: Secondary | ICD-10-CM | POA: Insufficient documentation

## 2015-05-05 DIAGNOSIS — E785 Hyperlipidemia, unspecified: Secondary | ICD-10-CM | POA: Diagnosis not present

## 2015-05-05 DIAGNOSIS — J449 Chronic obstructive pulmonary disease, unspecified: Secondary | ICD-10-CM | POA: Diagnosis not present

## 2015-05-05 DIAGNOSIS — F17210 Nicotine dependence, cigarettes, uncomplicated: Secondary | ICD-10-CM

## 2015-05-05 LAB — BASIC METABOLIC PANEL
ANION GAP: 7 (ref 5–15)
BUN: 18 mg/dL (ref 6–20)
CALCIUM: 9 mg/dL (ref 8.9–10.3)
CO2: 29 mmol/L (ref 22–32)
CREATININE: 1.12 mg/dL — AB (ref 0.44–1.00)
Chloride: 102 mmol/L (ref 101–111)
GFR calc non Af Amer: 53 mL/min — ABNORMAL LOW (ref >60)
GLUCOSE: 83 mg/dL (ref 65–99)
Potassium: 4.1 mmol/L (ref 3.5–5.1)
SODIUM: 138 mmol/L (ref 135–145)

## 2015-05-05 LAB — TSH: TSH: 2.377 u[IU]/mL (ref 0.350–4.500)

## 2015-05-05 MED ORDER — FUROSEMIDE 20 MG PO TABS: 40.0000 mg | ORAL_TABLET | Freq: Every day | ORAL | Status: DC

## 2015-05-05 NOTE — Patient Instructions (Signed)
Labs today  VQ Scan and Chest x-ray  Your physician has recommended that you have a sleep study. This test records several body functions during sleep, including: brain activity, eye movement, oxygen and carbon dioxide blood levels, heart rate and rhythm, breathing rate and rhythm, the flow of air through your mouth and nose, snoring, body muscle movements, and chest and belly movement.  You have been referred back to Dr [name]  Your physician has requested that you have a TEE. During a TEE, sound waves are used to create images of your heart. It provides your doctor with information about the size and shape of your heart and how well your heart's chambers and valves are working. In this test, a transducer is attached to the end of a flexible tube that's guided down your throat and into your esophagus (the tube leading from you mouth to your stomach) to get a more detailed image of your heart. You are not awake for the procedure. Please see the instruction sheet given to you today. For further information please visit [email or web address]  Your physician recommends that you schedule a follow-up appointment in: 3 weeks

## 2015-05-05 NOTE — Progress Notes (Signed)
6 Minute Walk Beginning: 94% on 4L continuous O2 via [place] 74 HR  Patient ambulated total of 780 ft (237.744 meters) with brisk, steady gait and pace.  Patient desatted to 72% on 4L, did not seem short of breath, but did take a brief resting break towards the end of walk and cyanotic fingertips noted.  O2 increased to 6L and sats improved to 91% remainder of walk.  Ending: 91% on 2L sitting 102 HR  [name] in HF clinic made aware who advised patient to increase O2 flow to 6L with activity.  Patient aware and agreeable.  Ave Filter

## 2015-05-06 LAB — RHEUMATOID FACTOR: Rhuematoid fact SerPl-aCnc: 7.9 IU/mL (ref 0.0–13.9)

## 2015-05-06 LAB — HIV ANTIBODY (ROUTINE TESTING W REFLEX): HIV Screen 4th Generation wRfx: NONREACTIVE

## 2015-05-07 NOTE — Progress Notes (Signed)
Patient ID: Kelly Donaldson, female   DOB: October 14, 1956, 59 y.o.   MRN: [id number] PCP: Kelly Donaldson Pulmonology: Dr Kelly Donaldson  59 yo with history of chronic hypoxemic respiratory failure and pulmonary hypertension presents to establish cardiology care.  She has been followed by Dr Kelly Donaldson with pulmonology.  She says that she has been on home oxygen for 6 years.  Currently, uses 2 L at rest and 4 L with activity.  She quit smoking about 2 months ago.  She had an echo done showing dilated RV with severe pulmonary hypertension.  She therefore had RHC which confirmed severe pulmonary hypertension and RV failure.  PFTs showed only mild obstruction and restriction with markedly decreased DLCO suggestive of pulmonary vascular disease.  There was concern for sarcoidosis on CT chest but lymph node biopsy showed no evidence for sarcoidosis.    She currently is short of breath after walking 50 feet.  This is stable long-term.  She is short of breath walking up steps.  No orthopnea/PND. No lightheadedness or syncope.  No chest pain.  She is on Lasix 40 mg daily.    6 minute walk: 238 meters => decreased oxygen saturation to 73%, increased to 6 L [place]  ECG: NSR, TWIs V1 and V2  Labs (2/16): BNP 69 Labs (5/16): LFTs normal, HCT 34.9  PMH: 1. Pulmonary hypertension: RHC (5/16) with mean RA 18, PA 83/33 mean 53, mean PCWP 19, CI 2.61, PVR 5.8 WU.   Echo (5/16) with EF 65-70%, septal flattening, dilated RV with PA systolic pressure 99 mmHg, +bubble study.  PFTs (4/16) with FVC 76%, FEV1 75%, ratio 97%, TLC 66%, DLCO 26% => mild restriction, mild obstruction, severe diffuse abnormality (pulmonary vascular problem).  CTA chest (4/16) with chronic interstitial and obstructive lung disease, small mediastinal and hilar lymph noes, no PE.  Lymph node biopsy negative for sarcoidosis (reactive changes).   - 6 minute walk (5/16): 238 m 2. Chronic venous insufficiency. 3. Hyperlipidemia.  4. GERD 5. Chronic hypoxemic respiratory failure:  Hypoxemia noted x 6 years at least.    SH: Married, quit smoking in 59/16.  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    FH: No pulmonary hypertension, no premature CAD, no sudden death.   ROS: All systems reviewed and negative except as per HPI.    Current Outpatient Prescriptions  Medication Sig Dispense Refill  . albuterol (PROVENTIL HFA;VENTOLIN HFA) 108 (90 BASE) MCG/ACT inhaler Inhale 1-2 puffs into the lungs every 6 (six) hours as needed for wheezing or shortness of breath. 1 Inhaler 0  . cetirizine (ZYRTEC) 10 MG tablet Take 10 mg by mouth daily.    . citalopram (CELEXA) 20 MG tablet Take 20 mg by mouth every morning.     . famotidine (PEPCID) 20 MG tablet Take 20 mg by mouth daily as needed for heartburn.     . furosemide (LASIX) 20 MG tablet Take 2 tablets (40 mg total) by mouth daily. 30 tablet   . HYDROcodone-acetaminophen (NORCO) 10-325 MG per tablet Take 1-2 tablets by mouth every 6 (six) hours as needed for moderate pain or severe pain.     . meloxicam (MOBIC) 7.5 MG tablet Take 7.5 mg by mouth daily.    Marland Kitchen omeprazole (PRILOSEC) 20 MG capsule Take 20 mg by mouth daily as needed (for acid reflux).     . pravastatin (PRAVACHOL) 20 MG tablet Take 20 mg by mouth daily.    . traZODone (DESYREL) 100 MG tablet Take 100-200 mg by mouth  at bedtime.     . Vitamin D, Ergocalciferol, (DRISDOL) 50000 UNITS CAPS capsule Take 50,000 Units by mouth every Monday.      No current facility-administered medications for this encounter.   BP 100/56 mmHg  Pulse 66  Wt 257 lb 8 oz (116.801 kg)  SpO2 97% General: NAD Neck: JVP 8-9 cm, no thyromegaly or thyroid nodule.  Lungs:  Dry crackles right base.  CV: Nondisplaced PMI.  Heart regular S1/S2, no S3/S4, no murmur.  1+ ankle edema.  No carotid bruit.  Normal pedal pulses.  Abdomen: Soft, nontender, no hepatosplenomegaly, no distention.  Skin: Intact without lesions or rashes.  Neurologic: Alert and oriented x 3.  Psych: Normal  affect. Extremities: No clubbing or cyanosis.  HEENT: Normal.   Assessment/Plan:  1. 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 Etiology uncertain.  Underlying lung disease not markedly severe by PFTs.  There was concern from CTA chest for sarcoidosis, but lymph node biopsy was negative.  LFTs normal.  - Further workup for sarcoidosis per Dr Kelly Donaldson.  - Complete serologic workup for HIV and rheumatologic causes of PAH: send RF, HIV, anticentromere Ab, anti-RNP, TSH, ANA.   - V/Q scan to rule out chronic PE.  - Sleep study.  - Continue Lasix 40 mg daily, needs BMET today.  - Positive bubble study on echo.  I will arrange a TEE to rule out intracardiac shunt that could contribute to pulmonary hypertension (likely PFO).  - I will arrange for initiation of macitentan 10 mg daily.  If she tolerates this well, given the evidence for starting combination therapy early on, I will add tadalafil.  2. Abnormal chest CT: Chronic interstitial and obstructive lung disease, mediastinal and hilar lymphadenopathy.  Concern for sarcoidosis.  PFTs showed only mild restriction and obstruction.  Biopsy did not show sarcoid.  Further workup per pulmonary.  3. Chronic hypoxemic respiratory failure: She needs to turn oxygen up to 6 L with activity.   Kelly Donaldson 05/07/2015

## 2015-05-07 NOTE — Addendum Note (Signed)
Encounter addended by: Laurey Morale, MD on: 05/07/2015 11:13 PM<BR>     Documentation filed: Problem List, Visit Diagnoses, Follow-up Section, LOS Section, Notes Section

## 2015-05-09 ENCOUNTER — Encounter: Payer: Self-pay | Admitting: Internal Medicine

## 2015-05-09 ENCOUNTER — Encounter: Payer: Self-pay | Admitting: *Deleted

## 2015-05-09 ENCOUNTER — Ambulatory Visit (INDEPENDENT_AMBULATORY_CARE_PROVIDER_SITE_OTHER): Payer: PRIVATE HEALTH INSURANCE | Admitting: Internal Medicine

## 2015-05-09 VITALS — BP 124/82 | HR 62 | Ht 67.0 in | Wt 255.0 lb

## 2015-05-09 DIAGNOSIS — J9611 Chronic respiratory failure with hypoxia: Secondary | ICD-10-CM | POA: Diagnosis not present

## 2015-05-09 DIAGNOSIS — D869 Sarcoidosis, unspecified: Secondary | ICD-10-CM | POA: Diagnosis not present

## 2015-05-09 DIAGNOSIS — I272 Other secondary pulmonary hypertension: Secondary | ICD-10-CM

## 2015-05-09 DIAGNOSIS — I2721 Secondary pulmonary arterial hypertension: Secondary | ICD-10-CM

## 2015-05-09 NOTE — Patient Instructions (Addendum)
Please see patient coordinator before you leave today  to schedule the V/Q should be done after 2 pm on 05/11/15  Use 2lpm at rest is ok,  4lpm walking around the house but need 6lpm walking outside any distance and with exercise.  Pulmonary follow up is as needed.

## 2015-05-09 NOTE — Progress Notes (Signed)
Subjective:    Patient ID: Kelly Donaldson, female    DOB: 06-19-1956,   MRN: [id number]    Brief patient profile:  54 yobf active smoker on noct 02 2lpm x 2013 and daytime doe x more than [employer] off 02 but losing ground gradually since onset  assoc with leg pain/ swelling = bilaterally already eval [place] pulmonary doctor = Orson Aloe rec wear 02 no dx so referred to pulmonary clinic 01/11/15 by Samuel Jester   History of Present Illness  01/11/2015 1st [facility] office visit/ Wert   Chief Complaint  Patient presents with  . Pulmonary Consult    Referred by Dr. Charm Barges. Pt c/o DOE x 2 yrs worse x 3 months. She states DOE seems worse in the evenings. She gets out of breath walking up stairs or "doing too much". She also c/o cough- prod with clear sputum and has trouble swallowing often.   chronic doe x years much worse x 3 months assoc with dry daytime cough and mild dysphagia/ also worse leg swelling just using 02 at hs  Prev w/u in [place] "neg" per pt, only rx= 02 rec  We will request  the last discharge summary from [facility] to leave 02 off sitting   but wear 2lpm at bedtime and with walking always use = 2lpm  Or goal of over 90% if you have a monitor  The key is to stop smoking completely before smoking completely stops you!  Wear the elastic stockings as much as possible Try prilosec 20mg   Take 30-60 min before first meal of the day and Pepcid 20 mg one bedtime until  Return (over the counter)  GERD diet   W/u   - pulmonary eval [place] last seen 12/17/12 with "neg CTa for PE, pfts ok except dlco 20%, echo c/w  PH, 02 dep hypoxemia > rec RHC and stop phenterimine" -Echo 09/15/14     LAE mildly dilated, as are RV and RA  - 01/11/2015   Walked 2lpm  x one lap @ 185 stopped due to  Sob, nl pace, no desat - Quit smoking 03/02/15  - PFTs 03/20/2015  Nl except for DLCO 26 corrects to 35%  - 03/20/2015   Walked 2lpm x one lap @ 185 stopped due to sob/ desat corrected  on 4lpm  - 04/07/2015 rec  referral to Dr 04/09/2015 for Right heart Cath - Repeat Echo with bubble study 04/19/2015 > With injection of agitated saline intravenously there is appearance of bubbles in L sided chambers 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 > 05/11/15 Pos small PFO  rec as of 03/20/2015 2lpm 24/7 except 6lpm walking outside of house         05/09/2015 f/u ov/Wert re:  2lpm / up 6lpm pulsed with ex / off cigs x 02/2015  Chief Complaint  Patient presents with  . Follow-up    Pt c/o SOB with activity, PND, sinus congestion. Pt denies any chest congestion, cough.   nose is better off all rx but still drippy   No obvious day to day or daytime variabilty or assoc  chest tightness, subjective wheeze overt sinus or hb symptoms. No unusual exp hx or h/o childhood pna/ asthma or knowledge of premature birth.  Sleeping ok without nocturnal  or early am exacerbation  of respiratory  c/o's or need for noct saba. Also denies any obvious fluctuation of symptoms with weather or environmental changes or other aggravating or alleviating factors except as  outlined above   Current Medications, Allergies, Complete Past Medical History, Past Surgical History, Family History, and Social History were reviewed in [employer] record.  ROS  The following are not active complaints unless bolded sore throat, dysphagia, dental problems, itching, sneezing,  nasal congestion with epistaxis  or excess/ purulent secretions, ear ache,   fever, chills, sweats, unintended wt loss, classically pleuritic or exertional cp, hemoptysis,  orthopnea pnd or leg swelling, presyncope, palpitations, heartburn, abdominal pain, anorexia, nausea, vomiting, diarrhea  or change in bowel or urinary habits, change in stools or urine, dysuria,hematuria,  rash, arthralgias, visual complaints, headache, numbness weakness or ataxia or problems with walking or coordination,  change in mood/affect or memory.                         Objective:   Physical Exam  Obese amb bf nad   05/09/15           255 Wt Readings from Last 3 Encounters:  03/20/15 254 lb (115.214 kg)  02/08/15 253 lb 6.4 oz (114.941 kg)  01/11/15 259 lb (117.482 kg)    Vital signs reviewed   HEENT: nl dentition, turbinates, and orophanx. Nl external ear canals without cough reflex   NECK :  without JVD/Nodes/TM/ nl carotid upstrokes bilaterally   LUNGS: no acc muscle use, clear to A and P bilaterally without cough on insp or exp maneuvers   CV:  RRR  no s3 or murmur or increase in P2,  1+ pitting bilateral lower ext edema   ABD:  soft and nontender with nl excursion in the supine position. No bruits or organomegaly, bowel sounds nl  MS:  warm without deformities, calf tenderness, cyanosis or clubbing  SKIN: warm and dry without lesions  Except for chronic venous stasis changes both legs   NEURO:  alert, approp, no deficits          Assessment & Plan:

## 2015-05-10 ENCOUNTER — Ambulatory Visit (HOSPITAL_COMMUNITY): Payer: PRIVATE HEALTH INSURANCE

## 2015-05-10 ENCOUNTER — Other Ambulatory Visit: Payer: Self-pay | Admitting: *Deleted

## 2015-05-10 ENCOUNTER — Telehealth (HOSPITAL_COMMUNITY): Payer: Self-pay | Admitting: Cardiology

## 2015-05-10 NOTE — Telephone Encounter (Signed)
Pt scheduled for TEE with [name] on 05/11/15 Cpt code 16109 With pts current insurance- [employer] No pre cert req'd

## 2015-05-11 ENCOUNTER — Ambulatory Visit (HOSPITAL_COMMUNITY): Payer: No Typology Code available for payment source

## 2015-05-11 ENCOUNTER — Encounter (HOSPITAL_COMMUNITY): Payer: Self-pay | Admitting: *Deleted

## 2015-05-11 ENCOUNTER — Encounter (HOSPITAL_COMMUNITY)
Admission: RE | Disposition: A | Discharge status: Home / Self Care | Payer: Self-pay | Source: Ambulatory Visit | Attending: Cardiology

## 2015-05-11 ENCOUNTER — Ambulatory Visit (HOSPITAL_COMMUNITY)
Admission: RE | Admit: 2015-05-11 | Discharge: 2015-05-11 | Disposition: A | Discharge status: Home / Self Care | Payer: No Typology Code available for payment source | Source: Ambulatory Visit | Attending: Cardiology | Admitting: Cardiology

## 2015-05-11 DIAGNOSIS — K219 Gastro-esophageal reflux disease without esophagitis: Secondary | ICD-10-CM | POA: Insufficient documentation

## 2015-05-11 DIAGNOSIS — Z79899 Other long term (current) drug therapy: Secondary | ICD-10-CM | POA: Diagnosis not present

## 2015-05-11 DIAGNOSIS — Z79891 Long term (current) use of opiate analgesic: Secondary | ICD-10-CM | POA: Insufficient documentation

## 2015-05-11 DIAGNOSIS — I272 Other secondary pulmonary hypertension: Secondary | ICD-10-CM | POA: Diagnosis not present

## 2015-05-11 DIAGNOSIS — I509 Heart failure, unspecified: Secondary | ICD-10-CM | POA: Diagnosis not present

## 2015-05-11 DIAGNOSIS — J449 Chronic obstructive pulmonary disease, unspecified: Secondary | ICD-10-CM | POA: Diagnosis not present

## 2015-05-11 DIAGNOSIS — Z9981 Dependence on supplemental oxygen: Secondary | ICD-10-CM | POA: Diagnosis not present

## 2015-05-11 DIAGNOSIS — I083 Combined rheumatic disorders of mitral, aortic and tricuspid valves: Secondary | ICD-10-CM | POA: Diagnosis not present

## 2015-05-11 DIAGNOSIS — E785 Hyperlipidemia, unspecified: Secondary | ICD-10-CM | POA: Diagnosis not present

## 2015-05-11 DIAGNOSIS — J9611 Chronic respiratory failure with hypoxia: Secondary | ICD-10-CM | POA: Insufficient documentation

## 2015-05-11 DIAGNOSIS — Z791 Long term (current) use of non-steroidal anti-inflammatories (NSAID): Secondary | ICD-10-CM | POA: Diagnosis not present

## 2015-05-11 DIAGNOSIS — Z87891 Personal history of nicotine dependence: Secondary | ICD-10-CM | POA: Insufficient documentation

## 2015-05-11 DIAGNOSIS — J849 Interstitial pulmonary disease, unspecified: Secondary | ICD-10-CM | POA: Diagnosis not present

## 2015-05-11 DIAGNOSIS — Q211 Atrial septal defect: Secondary | ICD-10-CM | POA: Diagnosis not present

## 2015-05-11 HISTORY — PX: TEE WITHOUT CARDIOVERSION: SHX5443

## 2015-05-11 LAB — BASIC METABOLIC PANEL
ANION GAP: 6 (ref 5–15)
BUN: 14 mg/dL (ref 6–20)
CHLORIDE: 105 mmol/L (ref 101–111)
CO2: 28 mmol/L (ref 22–32)
Calcium: 8.6 mg/dL — ABNORMAL LOW (ref 8.9–10.3)
Creatinine, Ser: 0.88 mg/dL (ref 0.44–1.00)
GFR calc Af Amer: >60 mL/min (ref >60)
GFR calc non Af Amer: >60 mL/min (ref >60)
Glucose, Bld: 103 mg/dL — ABNORMAL HIGH (ref 65–99)
Potassium: 4.4 mmol/L (ref 3.5–5.1)
SODIUM: 139 mmol/L (ref 135–145)

## 2015-05-11 LAB — PROTIME-INR
INR: 1.07 (ref 0.00–1.49)
PROTHROMBIN TIME: 14.1 s (ref 11.6–15.2)

## 2015-05-11 SURGERY — ECHOCARDIOGRAM, TRANSESOPHAGEAL
Anesthesia: Moderate Sedation

## 2015-05-11 MED ORDER — MIDAZOLAM HCL 5 MG/ML IJ SOLN
INTRAMUSCULAR | Status: AC
  Filled 2015-05-11: qty 2

## 2015-05-11 MED ORDER — BUTAMBEN-TETRACAINE-BENZOCAINE 2-2-14 % EX AERO
INHALATION_SPRAY | CUTANEOUS | Status: DC | PRN
Start: 2015-05-11 — End: 2015-05-11
  Administered 2015-05-11: 2 via TOPICAL

## 2015-05-11 MED ORDER — FENTANYL CITRATE (PF) 100 MCG/2ML IJ SOLN
INTRAMUSCULAR | Status: AC
  Filled 2015-05-11: qty 2

## 2015-05-11 MED ORDER — SODIUM CHLORIDE 0.9 % IV SOLN
INTRAVENOUS | Status: DC
  Administered 2015-05-11: 500 mL via INTRAVENOUS

## 2015-05-11 MED ORDER — FENTANYL CITRATE (PF) 100 MCG/2ML IJ SOLN
INTRAMUSCULAR | Status: DC | PRN
  Administered 2015-05-11: 25 ug via INTRAVENOUS
  Administered 2015-05-11: 12.5 ug via INTRAVENOUS

## 2015-05-11 MED ORDER — MIDAZOLAM HCL 10 MG/2ML IJ SOLN
INTRAMUSCULAR | Status: DC | PRN
Start: 2015-05-11 — End: 2015-05-11
  Administered 2015-05-11: 1 mg via INTRAVENOUS
  Administered 2015-05-11: 2 mg via INTRAVENOUS
  Administered 2015-05-11: 1 mg via INTRAVENOUS

## 2015-05-11 NOTE — CV Procedure (Signed)
Procedure: TEE  Indication: Pulmonary HTN, history of positive bubble study, assess for ASD.   Sedation: Versed 4 mg IV, Fentanyl 37.5 mcg IV  Findings: Please see echo section for full report.  Normal LV size and systolic function, EF 55-60%. Trileaflet aortic valve, trivial AI, no AS.  Trivial MR.  The RV was mildly dilated with mildly decreased systolic function.  D-shaped interventricular septum suggestive of RV pressure/volume overload.  Trivial TR.  Unable to measure full TR doppler envelope for PA systolic pressure estimation.  Mild biatrial enlargement.  No LAA thrombus.  There appeared to be a small PFO present with some bubbles crossing.  No evidence for ASD.  Normal caliber aorta with no significant plaque.   Impression: Suspect small PFO, no ASD visualized.   Kelly Donaldson 05/11/2015 10:31 AM

## 2015-05-11 NOTE — Discharge Instructions (Signed)
Transesophageal Echocardiogram °Transesophageal echocardiography (TEE) is a picture test of your heart using sound waves. The pictures taken can give very detailed pictures of your heart. This can help your doctor see if there are problems with your heart. TEE can check: °· If your heart has blood clots in it. °· How well your heart valves are working. °· If you have an infection on the inside of your heart. °· Some of the major arteries of your heart. °· If your heart valve is working after a repair. °· Your heart before a procedure that uses a shock to your heart to get the rhythm back to normal. °BEFORE THE PROCEDURE °· Do not eat or drink for 6 hours before the procedure or as told by your doctor. °· Make plans to have someone drive you home after the procedure. Do not drive yourself home. °· An IV tube will be put in your arm. °PROCEDURE °· You will be given a medicine to help you relax (sedative). It will be given through the IV tube. °· A numbing medicine will be sprayed or gargled in the back of your throat to help numb it. °· The tip of the probe is placed into the back of your mouth. You will be asked to swallow. This helps to pass the probe into your esophagus. °· Once the tip of the probe is in the right place, your doctor can take pictures of your heart. °· You may feel pressure at the back of your throat. °AFTER THE PROCEDURE °· You will be taken to a recovery area so the sedative can wear off. °· Your throat may be sore and scratchy. This will go away slowly over time. °· You will go home when you are fully awake and able to swallow liquids. °· You should have someone stay with you for the next 24 hours. °· Do not drive or operate machinery for the next 24 hours. °Document Released: 09/22/2009 Document Revised: 11/30/2013 Document Reviewed: 05/27/2013 °ExitCare® Patient Information ©2015 [employer]. This information is not intended to replace advice given to you by your health care provider. Make  sure you discuss any questions you have with your health care provider. ° °

## 2015-05-11 NOTE — H&P (View-Only) (Signed)
Patient ID: Kelly Donaldson, female   DOB: 06/25/1956, 59 y.o.   MRN: [id number] PCP: Angel Jones Pulmonology: Dr Wert  59 yo with history of chronic hypoxemic respiratory failure and pulmonary hypertension presents to establish cardiology care.  She has been followed by Dr Wert with pulmonology.  She says that she has been on home oxygen for 6 years.  Currently, uses 2 L at rest and 4 L with activity.  She quit smoking about 2 months ago.  She had an echo done showing dilated RV with severe pulmonary hypertension.  She therefore had RHC which confirmed severe pulmonary hypertension and RV failure.  PFTs showed only mild obstruction and restriction with markedly decreased DLCO suggestive of pulmonary vascular disease.  There was concern for sarcoidosis on CT chest but lymph node biopsy showed no evidence for sarcoidosis.    She currently is short of breath after walking 50 feet.  This is stable long-term.  She is short of breath walking up steps.  No orthopnea/PND. No lightheadedness or syncope.  No chest pain.  She is on Lasix 40 mg daily.    6 minute walk: 238 meters => decreased oxygen saturation to 73%, increased to 6 L [place]  ECG: NSR, TWIs V1 and V2  Labs (2/16): BNP 69 Labs (5/16): LFTs normal, HCT 34.9  PMH: 1. Pulmonary hypertension: RHC (5/16) with mean RA 18, PA 83/33 mean 53, mean PCWP 19, CI 2.61, PVR 5.8 WU.   Echo (5/16) with EF 65-70%, septal flattening, dilated RV with PA systolic pressure 99 mmHg, +bubble study.  PFTs (4/16) with FVC 76%, FEV1 75%, ratio 97%, TLC 66%, DLCO 26% => mild restriction, mild obstruction, severe diffuse abnormality (pulmonary vascular problem).  CTA chest (4/16) with chronic interstitial and obstructive lung disease, small mediastinal and hilar lymph noes, no PE.  Lymph node biopsy negative for sarcoidosis (reactive changes).   - 6 minute walk (5/16): 238 m 2. Chronic venous insufficiency. 3. Hyperlipidemia.  4. GERD 5. Chronic hypoxemic respiratory failure:  Hypoxemia noted x 6 years at least.    SH: Married, quit smoking in 3/16.  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    FH: No pulmonary hypertension, no premature CAD, no sudden death.   ROS: All systems reviewed and negative except as per HPI.    Current Outpatient Prescriptions  Medication Sig Dispense Refill  . albuterol (PROVENTIL HFA;VENTOLIN HFA) 108 (90 BASE) MCG/ACT inhaler Inhale 1-2 puffs into the lungs every 6 (six) hours as needed for wheezing or shortness of breath. 1 Inhaler 0  . cetirizine (ZYRTEC) 10 MG tablet Take 10 mg by mouth daily.    . citalopram (CELEXA) 20 MG tablet Take 20 mg by mouth every morning.     . famotidine (PEPCID) 20 MG tablet Take 20 mg by mouth daily as needed for heartburn.     . furosemide (LASIX) 20 MG tablet Take 2 tablets (40 mg total) by mouth daily. 30 tablet   . HYDROcodone-acetaminophen (NORCO) 10-325 MG per tablet Take 1-2 tablets by mouth every 6 (six) hours as needed for moderate pain or severe pain.     . meloxicam (MOBIC) 7.5 MG tablet Take 7.5 mg by mouth daily.    . omeprazole (PRILOSEC) 20 MG capsule Take 20 mg by mouth daily as needed (for acid reflux).     . pravastatin (PRAVACHOL) 20 MG tablet Take 20 mg by mouth daily.    . traZODone (DESYREL) 100 MG tablet Take 100-200 mg by mouth   at bedtime.     . Vitamin D, Ergocalciferol, (DRISDOL) 50000 UNITS CAPS capsule Take 50,000 Units by mouth every Monday.      No current facility-administered medications for this encounter.   BP 100/56 mmHg  Pulse 66  Wt 257 lb 8 oz (116.801 kg)  SpO2 97% General: NAD Neck: JVP 8-9 cm, no thyromegaly or thyroid nodule.  Lungs:  Dry crackles right base.  CV: Nondisplaced PMI.  Heart regular S1/S2, no S3/S4, no murmur.  1+ ankle edema.  No carotid bruit.  Normal pedal pulses.  Abdomen: Soft, nontender, no hepatosplenomegaly, no distention.  Skin: Intact without lesions or rashes.  Neurologic: Alert and oriented x 3.  Psych: Normal  affect. Extremities: No clubbing or cyanosis.  HEENT: Normal.   Assessment/Plan:  1. 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 Etiology uncertain.  Underlying lung disease not markedly severe by PFTs.  There was concern from CTA chest for sarcoidosis, but lymph node biopsy was negative.  LFTs normal.  - Further workup for sarcoidosis per Dr Wert.  - Complete serologic workup for HIV and rheumatologic causes of PAH: send RF, HIV, anticentromere Ab, anti-RNP, TSH, ANA.   - V/Q scan to rule out chronic PE.  - Sleep study.  - Continue Lasix 40 mg daily, needs BMET today.  - Positive bubble study on echo.  I will arrange a TEE to rule out intracardiac shunt that could contribute to pulmonary hypertension (likely PFO).  - I will arrange for initiation of macitentan 10 mg daily.  If she tolerates this well, given the evidence for starting combination therapy early on, I will add tadalafil.  2. Abnormal chest CT: Chronic interstitial and obstructive lung disease, mediastinal and hilar lymphadenopathy.  Concern for sarcoidosis.  PFTs showed only mild restriction and obstruction.  Biopsy did not show sarcoid.  Further workup per pulmonary.  3. Chronic hypoxemic respiratory failure: She needs to turn oxygen up to 6 L with activity.   [name] 05/07/2015   

## 2015-05-11 NOTE — Progress Notes (Signed)
Echocardiogram Echocardiogram Transesophageal has been performed.  Kelly Donaldson 05/11/2015, 11:03 AM

## 2015-05-11 NOTE — Interval H&P Note (Signed)
History and Physical Interval Note:  05/11/2015 10:09 AM  Kelly Donaldson  has presented today for surgery, with the diagnosis of ASD  The various methods of treatment have been discussed with the patient and family. After consideration of risks, benefits and other options for treatment, the patient has consented to  Procedure(s): TRANSESOPHAGEAL ECHOCARDIOGRAM (TEE) (N/A) as a surgical intervention .  The patient's history has been reviewed, patient examined, no change in status, stable for surgery.  I have reviewed the patient's chart and labs.  Questions were answered to the patient's satisfaction.     Aurther Harlin [employer]

## 2015-05-15 ENCOUNTER — Encounter: Payer: Self-pay | Admitting: Internal Medicine

## 2015-05-15 NOTE — Assessment & Plan Note (Signed)
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

## 2015-05-15 NOTE — Assessment & Plan Note (Addendum)
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 - 05/09/2015   Walked 4lpm x one lap @ 185 stopped due to  88% one lap > 05/11/15 Pos small PFO   rec as of 05/09/2015 2lpm 24/7 except 6lpm walking outside of house

## 2015-05-15 NOTE — Assessment & Plan Note (Signed)
-     ax node bx 04/19/2015  > nl LN   I had an extended discussion with the patient reviewing all relevant studies completed to date and  lasting 15 to 20 minutes of a 25 minute visit on the following ongoing concerns:   1) no def evidence of sarcoidosis   2) the general rule of thumb is that sarcoid CT/cxr changes are much more dramatic than the clinical picture and in her case the reverse is true  3) too risky to attempt tbbx here and too many reasons not to do empirical rx with steroids so no change in rx  4) Each maintenance medication was reviewed in detail including most importantly the difference between maintenance and as needed and under what circumstances the prns are to be used.  Please see instructions for details which were reviewed in writing and the patient given a copy.

## 2015-05-18 ENCOUNTER — Telehealth (HOSPITAL_COMMUNITY): Payer: Self-pay | Admitting: Cardiology

## 2015-05-18 NOTE — Telephone Encounter (Signed)
Pt scheduled for TEE with Dr. Gala Romney on 05/19/2015 With pt current insurance-[employer] per DeeDee with [employer] of [place] this procedure DOES NOT require pre cert as this is scheduled as a outpatient procedure cpt code 04599 [phone]

## 2015-05-19 ENCOUNTER — Encounter (HOSPITAL_COMMUNITY)
Admission: RE | Admit: 2015-05-19 | Discharge: 2015-05-19 | Disposition: A | Discharge status: Home / Self Care | Payer: No Typology Code available for payment source | Source: Ambulatory Visit | Attending: Cardiology | Admitting: Cardiology

## 2015-05-19 ENCOUNTER — Ambulatory Visit (HOSPITAL_COMMUNITY)
Admission: RE | Admit: 2015-05-19 | Discharge: 2015-05-19 | Disposition: A | Discharge status: Home / Self Care | Payer: PRIVATE HEALTH INSURANCE | Source: Ambulatory Visit | Attending: Cardiology | Admitting: Cardiology

## 2015-05-19 ENCOUNTER — Other Ambulatory Visit (HOSPITAL_COMMUNITY): Payer: Self-pay | Admitting: *Deleted

## 2015-05-19 DIAGNOSIS — I2721 Secondary pulmonary arterial hypertension: Secondary | ICD-10-CM

## 2015-05-19 DIAGNOSIS — I509 Heart failure, unspecified: Secondary | ICD-10-CM | POA: Insufficient documentation

## 2015-05-19 DIAGNOSIS — I272 Other secondary pulmonary hypertension: Secondary | ICD-10-CM | POA: Insufficient documentation

## 2015-05-19 DIAGNOSIS — J849 Interstitial pulmonary disease, unspecified: Secondary | ICD-10-CM | POA: Insufficient documentation

## 2015-05-19 DIAGNOSIS — R0602 Shortness of breath: Secondary | ICD-10-CM | POA: Insufficient documentation

## 2015-05-19 DIAGNOSIS — Z87891 Personal history of nicotine dependence: Secondary | ICD-10-CM | POA: Diagnosis not present

## 2015-05-19 DIAGNOSIS — I517 Cardiomegaly: Secondary | ICD-10-CM | POA: Diagnosis not present

## 2015-05-19 MED ORDER — TECHNETIUM TC 99M DIETHYLENETRIAME-PENTAACETIC ACID: 40.0000 | Freq: Once | INTRAVENOUS | Status: AC | PRN

## 2015-05-19 MED ORDER — TECHNETIUM TO 99M ALBUMIN AGGREGATED: 6.0000 | Freq: Once | INTRAVENOUS | Status: AC | PRN

## 2015-05-26 ENCOUNTER — Ambulatory Visit (HOSPITAL_COMMUNITY)
Admission: RE | Admit: 2015-05-26 | Discharge: 2015-05-26 | Disposition: A | Discharge status: Home / Self Care | Payer: No Typology Code available for payment source | Source: Ambulatory Visit | Attending: Cardiology | Admitting: Cardiology

## 2015-05-26 ENCOUNTER — Encounter (HOSPITAL_COMMUNITY): Payer: Self-pay

## 2015-05-26 VITALS — BP 112/66 | HR 62 | Wt 263.2 lb

## 2015-05-26 DIAGNOSIS — Z79899 Other long term (current) drug therapy: Secondary | ICD-10-CM | POA: Diagnosis not present

## 2015-05-26 DIAGNOSIS — Z87891 Personal history of nicotine dependence: Secondary | ICD-10-CM | POA: Insufficient documentation

## 2015-05-26 DIAGNOSIS — J449 Chronic obstructive pulmonary disease, unspecified: Secondary | ICD-10-CM | POA: Insufficient documentation

## 2015-05-26 DIAGNOSIS — I872 Venous insufficiency (chronic) (peripheral): Secondary | ICD-10-CM | POA: Insufficient documentation

## 2015-05-26 DIAGNOSIS — I272 Other secondary pulmonary hypertension: Secondary | ICD-10-CM | POA: Insufficient documentation

## 2015-05-26 DIAGNOSIS — K219 Gastro-esophageal reflux disease without esophagitis: Secondary | ICD-10-CM | POA: Insufficient documentation

## 2015-05-26 DIAGNOSIS — J9611 Chronic respiratory failure with hypoxia: Secondary | ICD-10-CM | POA: Insufficient documentation

## 2015-05-26 DIAGNOSIS — E785 Hyperlipidemia, unspecified: Secondary | ICD-10-CM | POA: Diagnosis not present

## 2015-05-26 DIAGNOSIS — I2721 Secondary pulmonary arterial hypertension: Secondary | ICD-10-CM

## 2015-05-26 MED ORDER — POTASSIUM CHLORIDE ER 20 MEQ PO TBCR: 20.0000 meq | EXTENDED_RELEASE_TABLET | Freq: Every day | ORAL | Status: DC

## 2015-05-26 MED ORDER — FUROSEMIDE 20 MG PO TABS: 40.0000 mg | ORAL_TABLET | Freq: Every day | ORAL | Status: DC

## 2015-05-26 MED ORDER — SILDENAFIL CITRATE 20 MG PO TABS: 20.0000 mg | ORAL_TABLET | Freq: Three times a day (TID) | ORAL | Status: DC

## 2015-05-26 NOTE — Patient Instructions (Signed)
START Sildenafil 20mg  (1 tablet ) 3 times a day.  START Potassium 20 meq (1 tablet) daily.  TAKE Lasix 40 mg daily.   LABS in 1 week (bmet)

## 2015-05-28 NOTE — Progress Notes (Signed)
Patient ID: Kelly Donaldson, female   DOB: 02-24-1956, 59 y.o.   MRN: [id number] PCP: Prudy Feeler Pulmonology: Dr [name]  59 yo with history of chronic hypoxemic respiratory failure and pulmonary hypertension presents to establish cardiology care.  She has been followed by Dr [name] with pulmonology.  She says that she has been on home oxygen for 6 years.  Currently, uses 2 L at rest and 4 L with activity.  She quit smoking earlier this year.  She had an echo done showing dilated RV with severe pulmonary hypertension.  She therefore had RHC which confirmed severe pulmonary hypertension and RV failure.  PFTs showed only mild obstruction and restriction with markedly decreased DLCO suggestive of pulmonary vascular disease.  There was concern for sarcoidosis on CT chest but lymph node biopsy showed no evidence for sarcoidosis.  V/Q scan showed no evidence for chronic PE.   She currently is short of breath after walking 50 feet.  This is stable long-term.  She is short of breath walking up steps.  No orthopnea/PND. No lightheadedness or syncope.  No chest pain.  She is on Lasix 40 mg daily.  I tried to start her on Opsumit, but her insurance will not pay for this, it will only cover Revatio.   6 minute walk (5/16): 238 meters => decreased oxygen saturation to 73%, increased to 6 L [place]  Labs (2/16): BNP 69 Labs (5/16): LFTs normal, HCT 34.9, RF negative, HIV negative, TSH negative Labs (6/16): K 4.4, creatinine 0.88  PMH: 1. Pulmonary hypertension: RHC (5/16) with mean RA 18, PA 83/33 mean 53, mean PCWP 19, CI 2.61, PVR 5.8 WU.   Echo (5/16) with EF 65-70%, septal flattening, dilated RV with PA systolic pressure 99 mmHg, +bubble study.  PFTs (4/16) with FVC 76%, FEV1 75%, ratio 97%, TLC 66%, DLCO 26% => mild restriction, mild obstruction, severe diffuse abnormality (pulmonary vascular problem).  CTA chest (4/16) with chronic interstitial and obstructive lung disease, small mediastinal and hilar lymph noes, no PE.   Lymph node biopsy negative for sarcoidosis (reactive changes).  V/Q scan (6/16): No acute or chronic PE. TEE (6/16): Normal LV size and systolic function, EF 55-60%,RV was mildly dilated with mildly decreased systolic function, D-shaped interventricular septum suggestive of RV pressure/volume overload, there appeared to be a small PFO present with some bubbles crossing, no ASD.   - 6 minute walk (5/16): 238 m 2. Chronic venous insufficiency. 3. Hyperlipidemia.  4. GERD 5. Chronic hypoxemic respiratory failure: Hypoxemia noted x 6 years at least.    SH: Married, quit smoking in 3/16.  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    FH: No pulmonary hypertension, no premature CAD, no sudden death.   ROS: All systems reviewed and negative except as per HPI.    Current Outpatient Prescriptions  Medication Sig Dispense Refill  . cetirizine (ZYRTEC) 10 MG tablet Take 10 mg by mouth daily.    . citalopram (CELEXA) 20 MG tablet Take 20 mg by mouth every morning.     . famotidine (PEPCID) 20 MG tablet Take 20 mg by mouth daily as needed for heartburn.     . furosemide (LASIX) 20 MG tablet Take 2 tablets (40 mg total) by mouth daily. 30 tablet 3  . HYDROcodone-acetaminophen (NORCO) 10-325 MG per tablet Take 1-2 tablets by mouth every 6 (six) hours as needed for moderate pain or severe pain.     . meloxicam (MOBIC) 7.5 MG tablet Take 7.5 mg by mouth daily.    Marland Kitchen  omeprazole (PRILOSEC) 20 MG capsule Take 20 mg by mouth daily as needed (for acid reflux).     . pravastatin (PRAVACHOL) 20 MG tablet Take 20 mg by mouth daily.    . Vitamin D, Ergocalciferol, (DRISDOL) 50000 UNITS CAPS capsule Take 50,000 Units by mouth every Monday.     Marland Kitchen albuterol (PROVENTIL HFA;VENTOLIN HFA) 108 (90 BASE) MCG/ACT inhaler Inhale 1-2 puffs into the lungs every 6 (six) hours as needed for wheezing or shortness of breath. (Patient not taking: Reported on 05/26/2015) 1 Inhaler 0  . potassium chloride 20 MEQ TBCR Take 20  mEq by mouth daily. 30 tablet 3  . sildenafil (REVATIO) 20 MG tablet Take 1 tablet (20 mg total) by mouth 3 (three) times daily. 90 tablet 0   No current facility-administered medications for this encounter.   BP 112/66 mmHg  Pulse 62  Wt 263 lb 4 oz (119.409 kg)  SpO2 96% General: NAD Neck: JVP 10 cm, no thyromegaly or thyroid nodule.  Lungs:  Dry crackles right base.  CV: Nondisplaced PMI.  Heart regular S1/S2, +S4, no murmur.  1+ ankle edema.  No carotid bruit.  Normal pedal pulses.  Abdomen: Soft, nontender, no hepatosplenomegaly, no distention.  Skin: Intact without lesions or rashes.  Neurologic: Alert and oriented x 3.  Psych: Normal affect. Extremities: No clubbing or cyanosis.  HEENT: Normal.   Assessment/Plan:  1. 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 Etiology uncertain.  Underlying lung disease not markedly severe by PFTs.  Restrictive PFTs thought to be due to enlarged heart in chest and obesity.  There was concern from CTA chest for sarcoidosis, but lymph node biopsy was negative.  LFTs normal.  V/Q scan negative for chronic PE.  Small PFO but no ASD on TEE.  She is volume overloaded on exam today.  - Open lung biopsy deemed too risky, probably not sarcoid.  - Serologic workup partially completed, still need to check ANA and RNP.    - Sleep study pending.  - Increase Lasix to 40 mg bid with KCl 20 daily, will need BMET in 1 week.   - Unable to get insurance coverage for Opsumit, will start Revatio 20 mg tid (allowed by her insurance).  2. Abnormal chest CT: Chronic interstitial and obstructive lung disease, mediastinal and hilar lymphadenopathy.  Concern for sarcoidosis.  PFTs showed only mild restriction and obstruction.  Biopsy did not show sarcoid.  Deemed too high risk for open lung biopsy.  Restriction on PFTs may be due to body habitus.  3. Chronic hypoxemic respiratory failure: She needs to turn oxygen up to 6 L with activity.   Followup in 1  month.   [name] 05/28/2015

## 2015-06-23 ENCOUNTER — Encounter (HOSPITAL_COMMUNITY): Payer: Self-pay

## 2015-06-23 ENCOUNTER — Ambulatory Visit (HOSPITAL_COMMUNITY)
Admission: RE | Admit: 2015-06-23 | Discharge: 2015-06-23 | Disposition: A | Discharge status: Home / Self Care | Payer: No Typology Code available for payment source | Source: Ambulatory Visit | Attending: Cardiology | Admitting: Cardiology

## 2015-06-23 VITALS — BP 96/54 | HR 70 | Wt 255.4 lb

## 2015-06-23 DIAGNOSIS — E785 Hyperlipidemia, unspecified: Secondary | ICD-10-CM | POA: Diagnosis not present

## 2015-06-23 DIAGNOSIS — Z79899 Other long term (current) drug therapy: Secondary | ICD-10-CM | POA: Insufficient documentation

## 2015-06-23 DIAGNOSIS — J449 Chronic obstructive pulmonary disease, unspecified: Secondary | ICD-10-CM | POA: Diagnosis not present

## 2015-06-23 DIAGNOSIS — I2721 Secondary pulmonary arterial hypertension: Secondary | ICD-10-CM

## 2015-06-23 DIAGNOSIS — I872 Venous insufficiency (chronic) (peripheral): Secondary | ICD-10-CM | POA: Insufficient documentation

## 2015-06-23 DIAGNOSIS — Z9981 Dependence on supplemental oxygen: Secondary | ICD-10-CM | POA: Insufficient documentation

## 2015-06-23 DIAGNOSIS — I272 Other secondary pulmonary hypertension: Secondary | ICD-10-CM | POA: Insufficient documentation

## 2015-06-23 DIAGNOSIS — J9611 Chronic respiratory failure with hypoxia: Secondary | ICD-10-CM | POA: Insufficient documentation

## 2015-06-23 DIAGNOSIS — Z87891 Personal history of nicotine dependence: Secondary | ICD-10-CM | POA: Insufficient documentation

## 2015-06-23 DIAGNOSIS — J849 Interstitial pulmonary disease, unspecified: Secondary | ICD-10-CM | POA: Insufficient documentation

## 2015-06-23 DIAGNOSIS — R590 Localized enlarged lymph nodes: Secondary | ICD-10-CM | POA: Insufficient documentation

## 2015-06-23 DIAGNOSIS — K219 Gastro-esophageal reflux disease without esophagitis: Secondary | ICD-10-CM | POA: Diagnosis not present

## 2015-06-23 LAB — BASIC METABOLIC PANEL
Anion gap: 7 (ref 5–15)
BUN: 14 mg/dL (ref 6–20)
CALCIUM: 9.2 mg/dL (ref 8.9–10.3)
CO2: 28 mmol/L (ref 22–32)
Chloride: 104 mmol/L (ref 101–111)
Creatinine, Ser: 1.1 mg/dL — ABNORMAL HIGH (ref 0.44–1.00)
GFR calc Af Amer: >60 mL/min (ref >60)
GFR calc non Af Amer: 54 mL/min — ABNORMAL LOW (ref >60)
GLUCOSE: 140 mg/dL — AB (ref 65–99)
Potassium: 4.3 mmol/L (ref 3.5–5.1)
SODIUM: 139 mmol/L (ref 135–145)

## 2015-06-23 LAB — BRAIN NATRIURETIC PEPTIDE: B NATRIURETIC PEPTIDE 5: 57.5 pg/mL (ref 0.0–100.0)

## 2015-06-23 MED ORDER — MACITENTAN 10 MG PO TABS: 1.0000 | ORAL_TABLET | Freq: Every day | ORAL | Status: DC

## 2015-06-23 NOTE — Patient Instructions (Signed)
Stop Sildenafil (Revatio)  Start Opsumit 10 mg daily  Labs today  Your physician recommends that you schedule a follow-up appointment in: 1 month

## 2015-06-23 NOTE — Progress Notes (Signed)
Preformed 6 min walk test, pt only able to walk for 4 min and 50 sec then had to stop due to SOB and chest tightness.  Pt ambulated 560 ft (170 m) during test.  On 4 L continuous oxygen sats ranged mainly in the 80s, when sats would drop pt would stop and rest and they improve back up to the 80s.  Dr Shirlee Latch is aware, after resting pt's sat at 94% and chest tightness has resolved

## 2015-06-24 NOTE — Progress Notes (Signed)
Patient ID: Kelly Donaldson, female   DOB: [birth year], 59 y.o.   MRN: [id number] PCP: Prudy Feeler Pulmonology: Dr [name]  59 yo with history of chronic hypoxemic respiratory failure and pulmonary hypertension presents to establish cardiology care.  She has been followed by Dr [name] with pulmonology.  She says that she has been on home oxygen for 6 years.  Currently, uses 2 L at rest and 4 L with activity.  She quit smoking earlier this year.  She had an echo done showing dilated RV with severe pulmonary hypertension.  She therefore had RHC which confirmed severe pulmonary hypertension and RV failure.  PFTs showed only mild obstruction and restriction with markedly decreased DLCO suggestive of pulmonary vascular disease.  There was concern for sarcoidosis on CT chest but lymph node biopsy showed no evidence for sarcoidosis.  V/Q scan showed no evidence for chronic PE.   She has been started on Revatio as her insurance would not cover macitentan.  On Revatio, she has had pain in all her joints. She does not think that she will be able to continue it.  She currently is short of breath after walking 100 feet.  Breathing has improved some on Revatio despite the joint pain.  She is short of breath walking up steps.  No orthopnea/PND. No lightheadedness or syncope.  No chest pain.  She is on Lasix 40 mg bid and weight is down 2 lbs.    6 minute walk (5/16): 238 meters => decreased oxygen saturation to 73%, increased to 6 L [place] 6 minute walk (7/16): 170 meters => unable to completee.   Labs (2/16): BNP 69 Labs (5/16): LFTs normal, HCT 34.9, RF negative, HIV negative, TSH negative Labs (6/16): K 4.4, creatinine 0.88  PMH: 1. Pulmonary hypertension: RHC (5/16) with mean RA 18, PA 83/33 mean 53, mean PCWP 19, CI 2.61, PVR 5.8 WU.   Echo (5/16) with EF 65-70%, septal flattening, dilated RV with PA systolic pressure 99 mmHg, +bubble study.  PFTs (4/16) with FVC 76%, FEV1 75%, ratio 97%, TLC 66%, DLCO 26% => mild  restriction, mild obstruction, severe diffuse abnormality (pulmonary vascular problem).  CTA chest (4/16) with chronic interstitial and obstructive lung disease, small mediastinal and hilar lymph noes, no PE.  Lymph node biopsy negative for sarcoidosis (reactive changes).  V/Q scan (6/16): No acute or chronic PE. TEE (6/16): Normal LV size and systolic function, EF 55-60%,RV was mildly dilated with mildly decreased systolic function, D-shaped interventricular septum suggestive of RV pressure/volume overload, there appeared to be a small PFO present with some bubbles crossing, no ASD.   - 6 minute walk (5/16): 238 m 2. Chronic venous insufficiency. 3. Hyperlipidemia.  4. GERD 5. Chronic hypoxemic respiratory failure: Hypoxemia noted x 6 years at least.    SH: Married, quit smoking in 3/16.  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    FH: No pulmonary hypertension, no premature CAD, no sudden death.   ROS: All systems reviewed and negative except as per HPI.    Current Outpatient Prescriptions  Medication Sig Dispense Refill  . albuterol (PROVENTIL HFA;VENTOLIN HFA) 108 (90 BASE) MCG/ACT inhaler Inhale 1-2 puffs into the lungs every 6 (six) hours as needed for wheezing or shortness of breath. 1 Inhaler 0  . cetirizine (ZYRTEC) 10 MG tablet Take 10 mg by mouth daily.    . citalopram (CELEXA) 20 MG tablet Take 20 mg by mouth every morning.     . famotidine (PEPCID) 20 MG tablet Take  20 mg by mouth daily as needed for heartburn.     . furosemide (LASIX) 20 MG tablet Take 40 mg by mouth 2 (two) times daily.    Marland Kitchen HYDROcodone-acetaminophen (NORCO) 10-325 MG per tablet Take 1-2 tablets by mouth every 6 (six) hours as needed for moderate pain or severe pain.     . meloxicam (MOBIC) 7.5 MG tablet Take 7.5 mg by mouth daily.    Marland Kitchen omeprazole (PRILOSEC) 20 MG capsule Take 20 mg by mouth daily as needed (for acid reflux).     . potassium chloride 20 MEQ TBCR Take 20 mEq by mouth daily. 30 tablet  3  . pravastatin (PRAVACHOL) 20 MG tablet Take 20 mg by mouth daily.    . Vitamin D, Ergocalciferol, (DRISDOL) 50000 UNITS CAPS capsule Take 50,000 Units by mouth every Monday.     . Macitentan (OPSUMIT) 10 MG TABS Take 1 tablet by mouth daily. 30 tablet    No current facility-administered medications for this encounter.   BP 96/54 mmHg  Pulse 70  Wt 255 lb 6.4 oz (115.849 kg)  SpO2 95% General: NAD Neck: JVP 7-8 cm, no thyromegaly or thyroid nodule.  Lungs:  Dry crackles right base.  CV: Nondisplaced PMI.  Heart regular S1/S2, +S4, no murmur.  Trace edema.  No carotid bruit.  Normal pedal pulses.  Abdomen: Soft, nontender, no hepatosplenomegaly, no distention.  Skin: Intact without lesions or rashes.  Neurologic: Alert and oriented x 3.  Psych: Normal affect. Extremities: No clubbing or cyanosis.  HEENT: Normal.   Assessment/Plan:  1. 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 Etiology uncertain.  Underlying lung disease not markedly severe by PFTs.  Restrictive PFTs thought to be due to enlarged heart in chest and obesity.  There was concern from CTA chest for sarcoidosis, but lymph node biopsy was negative.  LFTs normal.  V/Q scan negative for chronic PE.  Small PFO but no ASD on TEE.  Volume status looks better today compared to last appointment.  She did not, however, do as well on 6 minute walk.  She actually stopped the 6 minute walk before it was completed due to joint pain.  - Open lung biopsy deemed too risky, probably not sarcoid.  - Serologic workup partially completed, still waiting for ANA and RNP.    - Sleep study pending (07/23/15).  - Continue Lasix 40 mg bid.  Check BMET/BNP today.   - She has failed Revatio due to joint pain.  I will see if her insurance will now cover macitentan 10 mg daily.  I would like to switch her to this and have her come back in 1 month.  2. Abnormal chest CT: Chronic interstitial and obstructive lung disease, mediastinal and hilar  lymphadenopathy.  Concern for sarcoidosis.  PFTs showed only mild restriction and obstruction.  Biopsy did not show sarcoid.  Deemed too high risk for open lung biopsy.  Restriction on PFTs may be due to body habitus.  3. Chronic hypoxemic respiratory failure: She needs to turn oxygen up to 6 L with activity.   Followup in 1 month.   [name] 06/24/2015

## 2015-06-26 LAB — ANTINUCLEAR ANTIBODIES, IFA: ANA Ab, IFA: NEGATIVE

## 2015-06-29 ENCOUNTER — Telehealth: Payer: Self-pay | Admitting: Cardiology

## 2015-06-29 NOTE — Telephone Encounter (Signed)
New Prob    Pt is requesting a new prescription for Hydrocodone sent to [facility] in [facility].

## 2015-06-29 NOTE — Telephone Encounter (Signed)
Pt aware we do not prescribe pain meds, she will contact pcp

## 2015-06-30 LAB — MISCELLANEOUS TEST: Miscellaneous Test Results: NEGATIVE

## 2015-07-23 ENCOUNTER — Ambulatory Visit (HOSPITAL_BASED_OUTPATIENT_CLINIC_OR_DEPARTMENT_OTHER): Payer: No Typology Code available for payment source | Attending: Cardiology

## 2015-07-23 VITALS — Ht 69.0 in | Wt 258.0 lb

## 2015-07-23 DIAGNOSIS — R0683 Snoring: Secondary | ICD-10-CM | POA: Diagnosis not present

## 2015-07-23 DIAGNOSIS — I509 Heart failure, unspecified: Secondary | ICD-10-CM | POA: Diagnosis not present

## 2015-07-23 DIAGNOSIS — I110 Hypertensive heart disease with heart failure: Secondary | ICD-10-CM | POA: Diagnosis not present

## 2015-07-23 DIAGNOSIS — Z9981 Dependence on supplemental oxygen: Secondary | ICD-10-CM | POA: Insufficient documentation

## 2015-07-23 DIAGNOSIS — G4736 Sleep related hypoventilation in conditions classified elsewhere: Secondary | ICD-10-CM | POA: Insufficient documentation

## 2015-07-23 DIAGNOSIS — G4733 Obstructive sleep apnea (adult) (pediatric): Secondary | ICD-10-CM | POA: Diagnosis present

## 2015-07-23 DIAGNOSIS — I2721 Secondary pulmonary arterial hypertension: Secondary | ICD-10-CM

## 2015-07-23 DIAGNOSIS — I272 Other secondary pulmonary hypertension: Secondary | ICD-10-CM | POA: Diagnosis not present

## 2015-07-24 ENCOUNTER — Ambulatory Visit (HOSPITAL_COMMUNITY)
Admission: RE | Admit: 2015-07-24 | Discharge: 2015-07-24 | Disposition: A | Discharge status: Home / Self Care | Payer: No Typology Code available for payment source | Source: Ambulatory Visit | Attending: Cardiology | Admitting: Cardiology

## 2015-07-24 ENCOUNTER — Encounter (HOSPITAL_COMMUNITY): Payer: Self-pay

## 2015-07-24 VITALS — BP 112/68 | HR 80 | Wt 256.5 lb

## 2015-07-24 DIAGNOSIS — J9611 Chronic respiratory failure with hypoxia: Secondary | ICD-10-CM | POA: Diagnosis not present

## 2015-07-24 DIAGNOSIS — E785 Hyperlipidemia, unspecified: Secondary | ICD-10-CM | POA: Diagnosis not present

## 2015-07-24 DIAGNOSIS — Z79899 Other long term (current) drug therapy: Secondary | ICD-10-CM | POA: Insufficient documentation

## 2015-07-24 DIAGNOSIS — I272 Other secondary pulmonary hypertension: Secondary | ICD-10-CM | POA: Insufficient documentation

## 2015-07-24 DIAGNOSIS — I872 Venous insufficiency (chronic) (peripheral): Secondary | ICD-10-CM | POA: Insufficient documentation

## 2015-07-24 DIAGNOSIS — J449 Chronic obstructive pulmonary disease, unspecified: Secondary | ICD-10-CM | POA: Diagnosis not present

## 2015-07-24 DIAGNOSIS — I2721 Secondary pulmonary arterial hypertension: Secondary | ICD-10-CM

## 2015-07-24 DIAGNOSIS — K219 Gastro-esophageal reflux disease without esophagitis: Secondary | ICD-10-CM | POA: Diagnosis not present

## 2015-07-24 DIAGNOSIS — Z87891 Personal history of nicotine dependence: Secondary | ICD-10-CM | POA: Diagnosis not present

## 2015-07-24 DIAGNOSIS — Z9981 Dependence on supplemental oxygen: Secondary | ICD-10-CM | POA: Diagnosis not present

## 2015-07-24 DIAGNOSIS — R599 Enlarged lymph nodes, unspecified: Secondary | ICD-10-CM | POA: Diagnosis not present

## 2015-07-24 MED ORDER — POTASSIUM CHLORIDE ER 20 MEQ PO TBCR: 40.0000 mg | EXTENDED_RELEASE_TABLET | Freq: Every day | ORAL | Status: DC

## 2015-07-24 MED ORDER — FUROSEMIDE 40 MG PO TABS: ORAL_TABLET | ORAL | Status: DC

## 2015-07-24 NOTE — Progress Notes (Signed)
Patient ID: Kelly Donaldson, female   DOB: August 11, 1956, 59 y.o.   MRN: [id number] PCP: Prudy Feeler Pulmonology: Dr [name]  59 yo with history of chronic hypoxemic respiratory failure and pulmonary hypertension presents to establish cardiology care.  She has been followed by Dr [name] with pulmonology.  She says that she has been on home oxygen for 6 years.  Currently, uses 2 L at rest and 4 L with activity.  She quit smoking earlier this year.  She had an echo done showing dilated RV with severe pulmonary hypertension.  She therefore had RHC which confirmed severe pulmonary hypertension and RV failure.  PFTs showed only mild obstruction and restriction with markedly decreased DLCO suggestive of pulmonary vascular disease.  There was concern for sarcoidosis on CT chest but lymph node biopsy showed no evidence for sarcoidosis.  V/Q scan showed no evidence for chronic PE.   She presents today for PAH follow up.  At last visit we stopped Revatio as she has had pain in all her joints. She currently is short of breath after walking 100 feet, give or take.  Breathing has worsened again since stopping Revatio.  She is short of breath walking up steps. Had an episode of tachypnea in church yesterday, which resolved with rest and increased 02. No orthopnea/PND. Had some lightheadedness with the episode yesterday, but anxiety was a factor.  No chest pain.  She is on Lasix 40 mg bid and weight is up 1 lbs.  She feels like she is not peeing much anymore. Has some incomplete emptying  6 minute walk (5/16): 238 meters => decreased oxygen saturation to 73%, increased to 6 L [place] 6 minute walk (7/16): 170 meters => unable to complete.   Labs (2/16): BNP 69 Labs (5/16): LFTs normal, HCT 34.9, RF negative, HIV negative, TSH negative Labs (6/16): K 4.4, creatinine 0.88 Labs (7/16): ANA negative, BNP 58, K 4.3, creatinine 1.1  PMH: 1. Pulmonary hypertension: RHC (5/16) with mean RA 18, PA 83/33 mean 53, mean PCWP 19, CI 2.61, PVR  5.8 WU.   Echo (5/16) with EF 65-70%, septal flattening, dilated RV with PA systolic pressure 99 mmHg, +bubble study.  PFTs (4/16) with FVC 76%, FEV1 75%, ratio 97%, TLC 66%, DLCO 26% => mild restriction, mild obstruction, severe diffuse abnormality (pulmonary vascular problem).  CTA chest (4/16) with chronic interstitial and obstructive lung disease, small mediastinal and hilar lymph noes, no PE.  Lymph node biopsy negative for sarcoidosis (reactive changes).  V/Q scan (6/16): No acute or chronic PE. TEE (6/16): Normal LV size and systolic function, EF 55-60%,RV was mildly dilated with mildly decreased systolic function, D-shaped interventricular septum suggestive of RV pressure/volume overload, there appeared to be a small PFO present with some bubbles crossing, no ASD.  ANA negative.  2. Chronic venous insufficiency. 3. Hyperlipidemia.  4. GERD 5. Chronic hypoxemic respiratory failure: Hypoxemia noted x 6 years at least.    SH: Married, quit smoking in 3/16.  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    FH: No pulmonary hypertension, no premature CAD, no sudden death.   ROS: All systems reviewed and negative except as per HPI.    Current Outpatient Prescriptions  Medication Sig Dispense Refill  . albuterol (PROVENTIL HFA;VENTOLIN HFA) 108 (90 BASE) MCG/ACT inhaler Inhale 1-2 puffs into the lungs every 6 (six) hours as needed for wheezing or shortness of breath. 1 Inhaler 0  . cetirizine (ZYRTEC) 10 MG tablet Take 10 mg by mouth daily.    Marland Kitchen  citalopram (CELEXA) 20 MG tablet Take 40 mg by mouth every morning.     . famotidine (PEPCID) 20 MG tablet Take 20 mg by mouth daily as needed for heartburn.     . furosemide (LASIX) 20 MG tablet Take 40 mg by mouth 2 (two) times daily.    Marland Kitchen HYDROcodone-acetaminophen (NORCO) 10-325 MG per tablet Take 1-2 tablets by mouth every 6 (six) hours as needed for moderate pain or severe pain.     . meloxicam (MOBIC) 7.5 MG tablet Take 7.5 mg by mouth  daily.    Marland Kitchen omeprazole (PRILOSEC) 20 MG capsule Take 20 mg by mouth daily as needed (for acid reflux).     . potassium chloride 20 MEQ TBCR Take 20 mEq by mouth daily. 30 tablet 3  . pravastatin (PRAVACHOL) 20 MG tablet Take 20 mg by mouth daily.    . Vitamin D, Ergocalciferol, (DRISDOL) 50000 UNITS CAPS capsule Take 50,000 Units by mouth every Monday.     . Macitentan (OPSUMIT) 10 MG TABS Take 1 tablet by mouth daily. (Patient not taking: Reported on 07/24/2015) 30 tablet    No current facility-administered medications for this encounter.   BP 112/68 mmHg  Pulse 80  Wt 256 lb 8 oz (116.348 kg)  SpO2 97%  General: NAD Neck: JVP 9-10 cm, no thyromegaly or thyroid nodule.  Lungs:  Dry crackles right base.  CV: Nondisplaced PMI.  Heart regular S1/S2, + right-sided S3, no murmur.  Trace edema.  No carotid bruit.  Normal pedal pulses.  Abdomen: Soft, nontender, no hepatosplenomegaly, no distention.  Skin: Intact without lesions or rashes.  Neurologic: Alert and oriented x 3.  Psych: Normal affect. Extremities: No clubbing or cyanosis.  HEENT: Normal.   Assessment/Plan:  1. 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 Etiology uncertain.  Underlying lung disease not markedly severe by PFTs.  Restrictive PFTs thought to be due to enlarged heart in chest and obesity.  There was concern from CTA chest for sarcoidosis, but lymph node biopsy was negative.  LFTs normal, ANA negative.  V/Q scan negative for chronic PE.  Small PFO but no ASD on TEE.  She did not tolerate Revatio due to joint pain (now resolved).   On exam today, she is volume overloaded with NYHA class III symptoms.  - Open lung biopsy deemed too risky, probably not sarcoid.  - Serologic workup partially completed. ANA negative.  - Sleep study result pending (done 07/23/15).  -  Increase Lasix to 80 mg qam/40 mg qpm and KCl to 40 daily, BMET in 10 days.   -  Has not started macitentan yet, still awaiting insurance  approval. Want to place on macitentan 10 mg daily. Plan is to start her on this and see her again after a month of use.  Next step would be addition of Adcirca (?if this would cause her to have joint pains like Revatio) versus selexipeg.  2. Abnormal chest CT: Chronic interstitial and obstructive lung disease, mediastinal and hilar lymphadenopathy.  Concern for sarcoidosis.  PFTs showed only mild restriction and obstruction.  Biopsy did not show sarcoid.  Deemed too high risk for open lung biopsy.  Restriction on PFTs may be due to body habitus.  3. Chronic hypoxemic respiratory failure: Continue oxygen up to 6 L with activity.   Graciella Freer PA-C 07/24/2015   Patient seen with PA, agree with the above note.  Mrs Hallen still has not gotten her macitentan.  Will start when it is improved.  Meanwhile, she is volume overloaded with NYHA class III symptoms (prominent RV failure).  I will increase Lasix and KCl as above with BMET in 10 days and followup in 1 month.   [name] 07/25/2015

## 2015-07-24 NOTE — Progress Notes (Signed)
Advanced Heart Failure Medication Review by a Pharmacist  Does the patient  feel that his/her medications are working for him/her?  yes  Has the patient been experiencing any side effects to the medications prescribed?  no  Does the patient measure his/her own blood pressure or blood glucose at home?  no   Does the patient have any problems obtaining medications due to transportation or finances?   no  Understanding of regimen: good Understanding of indications: good Potential of compliance: good    Pharmacist comments:  Mrs. Venier is a pleasant 59 yo F presenting without a medication list. She seems to have a good understanding of her medication regimen and the importance of consistent use. She states that she never misses any doses of her medications and states that her weight has been stable at home. Of note, she has not initiated therapy with Opsumit as the pharmacy told her that they would be calling her once they received the medication. I'm not sure where we are in the approval process. Will speak to [facility] who manages these approvals when she returns tomorrow. Medication list was reconciled and she did not have any other medication-related concerns.   Tyler Deis. Bonnye Fava, PharmD, BCPS, CPP Clinical Pharmacist Pager: [phone] Phone: [phone] 07/24/2015 11:37 AM

## 2015-07-24 NOTE — Patient Instructions (Signed)
INCREASE Lasix to 80mg  am and 40mg  pm.  TAKE 40mg  of Potassium daily.  LABS in (bmet bnp)  FOLLOW up in 1 month.

## 2015-07-25 ENCOUNTER — Telehealth: Payer: Self-pay | Admitting: Licensed Clinical Social Worker

## 2015-07-25 ENCOUNTER — Telehealth: Payer: Self-pay | Admitting: Cardiology

## 2015-07-25 NOTE — Sleep Study (Signed)
SLEEP STUDY    Patient Name: Kelly Donaldson, Kelly Donaldson Date: 07/23/2015 MRN: [id number] Gender: Female D.O.B: [birth year] Age (years): 33 Referring Provider: [name] Interpreting Physician: Armanda Magic MD, ABSM RPSGT: Cherylann Parr  Weight (lbs): 258 Height (inches): 69 BMI: 38 Neck Size: 15.00  CLINICAL INFORMATION Sleep Study Type: NPSG  Indication for sleep study: Hypertension, OSA, Snoring  Epworth Sleepiness Score: 12  SLEEP STUDY TECHNIQUE As per the AASM Manual for the Scoring of Sleep and Associated Events v2.3 (April 2016) with a hypopnea requiring 4% desaturations.  The channels recorded and monitored were frontal, central and occipital EEG, electrooculogram (EOG), submentalis EMG (chin), nasal and oral airflow, thoracic and abdominal wall motion, anterior tibialis EMG, snore microphone, electrocardiogram, and pulse oximetry.  MEDICATIONS Patient's medications include: Proventil, Zyrtec, Celexa, Pepcid, Lasix, Norco, Macitentin, Mobic, Pravachol, Prilosec, Vit D. Medications self-administered by patient during sleep study : No sleep medicine administered.  SLEEP ARCHITECTURE The study was initiated at 10:38:55 PM and ended at 5:15:41 AM.  Sleep onset time was prolonged at 94.0 minutes and the sleep efficiency was reduced at 53.9%. The total sleep time was reduced at 213.8 minutes.  Stage REM latency was prolonged at 198.5 minutes.  The patient spent 6.08% of the night in stage N1 sleep, 75.91% in stage N2 sleep, 0.00% in stage N3 and 18.01% in REM.  Alpha intrusion was absent.  Supine sleep was 53.32%.  RESPIRATORY PARAMETERS The overall apnea/hypopnea index (AHI) was 0.0 per hour. There were 0 total apneas, including 0 obstructive, 0 central and 0 mixed apneas. There were 0 hypopneas and 3 RERAs.  The AHI during Stage REM sleep was 0.0 per hour.  AHI while supine was 0.0 per hour.  The mean oxygen saturation was 93.61%. The minimum SpO2 during  sleep was 88.00%.  Time spent with Oxygen saturations < 88% during sleep was 0.1 minutes.    Loud snoring was noted during this study.  CARDIAC DATA The 2 lead EKG demonstrated sinus rhythm with idioventricular conduction delay. The mean heart rate was 28.03 beats per minute. Other EKG findings include: None  LEG MOVEMENT DATA The total PLMS were 1 with a resulting PLMS index of 0.28. Associated arousal with leg movement index was 0.0 .  IMPRESSIONS No significant obstructive sleep apnea occurred during this study (AHI = 0.0/h). No significant central sleep apnea occurred during this study (CAI = 0.0/h). Mild oxygen desaturation was noted during this study (Min O2 = 88.00%).  The patient is on chronic Oxygen at 2L [place] at home which was continued throughout the sleep study.  Time spent with Oxygen saturations < 88% during sleep was 0.1 minutes.   The patient snored with Loud snoring volume. EKG showed NSR with IVCD during the study.  DIAGNOSIS Nocturnal Hypoxemia (327.26 [G47.36 ICD-10])  RECOMMENDATIONS Avoid alcohol, sedatives and other CNS depressants that may worsen sleep apnea and disrupt normal sleep architecture. Sleep hygiene should be reviewed to assess factors that may improve sleep quality. Weight management and regular exercise should be initiated or continued if appropriate. Consider review of patients medications.  Patient had prolonged latency to REM sleep which can be due to SSRI therapy.    Signed: Armanda Magic, MD ABSM Diplomate, American Board of Sleep Medicine 07/25/2015

## 2015-07-25 NOTE — Telephone Encounter (Signed)
CSW received referral from Maureen Ralphs in [place] to contact patient regarding community resources. CSW contacted patient who requests assistance with obtaining assistance at home with housekeeping. Patient states that she receives disability and has [employer]. She is unable to drive or complete household tasks due to cardiac disability. She states "my children have been helping for the past year but are busy" and her husband works 10 hour days. CSW explained typical homecare eligibility guidelines and need for skilled nursing for services through insurance and private pay options. Patient states she does not need skilled nursing just housekeeping. CSW encouraged patient to contact her insurance for eligibility guidelines and inquire about in network homecare agencies. Patient verbalized understanding and will contact CSW if further needs arise. Lasandra Beech, LCSW [phone]

## 2015-07-25 NOTE — Telephone Encounter (Signed)
Please let patient know that sleep study showed no significant sleep apnea.    

## 2015-08-01 ENCOUNTER — Other Ambulatory Visit (HOSPITAL_COMMUNITY): Payer: Self-pay | Admitting: *Deleted

## 2015-08-01 NOTE — Telephone Encounter (Signed)
Left detailed message on patients private voicemail with results

## 2015-08-01 NOTE — Telephone Encounter (Signed)
Called patient but she was on the other line and said she would call right back.

## 2015-08-24 ENCOUNTER — Ambulatory Visit (HOSPITAL_COMMUNITY)
Admission: RE | Admit: 2015-08-24 | Discharge: 2015-08-24 | Disposition: A | Discharge status: Home / Self Care | Payer: No Typology Code available for payment source | Source: Ambulatory Visit | Attending: Cardiology | Admitting: Cardiology

## 2015-08-24 VITALS — BP 100/58 | HR 65 | Wt 258.8 lb

## 2015-08-24 DIAGNOSIS — I272 Other secondary pulmonary hypertension: Secondary | ICD-10-CM | POA: Insufficient documentation

## 2015-08-24 DIAGNOSIS — J9611 Chronic respiratory failure with hypoxia: Secondary | ICD-10-CM | POA: Insufficient documentation

## 2015-08-24 DIAGNOSIS — I2721 Secondary pulmonary arterial hypertension: Secondary | ICD-10-CM

## 2015-08-24 LAB — BASIC METABOLIC PANEL
Anion gap: 10 (ref 5–15)
BUN: 18 mg/dL (ref 6–20)
CO2: 25 mmol/L (ref 22–32)
CREATININE: 1.26 mg/dL — AB (ref 0.44–1.00)
Calcium: 9 mg/dL (ref 8.9–10.3)
Chloride: 104 mmol/L (ref 101–111)
GFR calc Af Amer: 53 mL/min — ABNORMAL LOW (ref >60)
GFR, EST NON AFRICAN AMERICAN: 46 mL/min — AB (ref >60)
GLUCOSE: 100 mg/dL — AB (ref 65–99)
Potassium: 4.4 mmol/L (ref 3.5–5.1)
SODIUM: 139 mmol/L (ref 135–145)

## 2015-08-24 LAB — BRAIN NATRIURETIC PEPTIDE: B NATRIURETIC PEPTIDE 5: 90.6 pg/mL (ref 0.0–100.0)

## 2015-08-24 MED ORDER — FUROSEMIDE 40 MG PO TABS: ORAL_TABLET | ORAL | Status: DC

## 2015-08-24 NOTE — Progress Notes (Signed)
6 min walk test completed, pt ambulated 660 ft (201 m) pt's sats ranged from 88-72% on 5 L O2, HR ranged 80-96

## 2015-08-24 NOTE — Patient Instructions (Signed)
Start Adcira 20 mg Three times a day, we will send referral to [facility], they will contact you before shipping medication  Increase Furosemide (Lasix) to 80 mg EVERY AM and 40 mg every other PM ALTERNATING with 80 mg every other PM  Labs today  Your physician recommends that you schedule a follow-up appointment in: 1 months with Dr Shirlee Latch

## 2015-08-25 NOTE — Progress Notes (Signed)
Patient ID: Kelly Donaldson, female   DOB: 1956-09-27, 59 y.o.   MRN: [id number] PCP: Prudy Feeler Pulmonology: Dr [name]  59 yo with history of chronic hypoxemic respiratory failure and pulmonary hypertension.  She has been followed by Dr [name] with pulmonology.  She says that she has been on home oxygen for 6 years.  She quit smoking in 2016.  She had an echo done in 5/16 showing dilated RV with severe pulmonary hypertension.  She therefore had RHC in 5/16 which confirmed severe pulmonary hypertension and RV failure.  PFTs showed only mild obstruction and restriction with markedly decreased DLCO suggestive of pulmonary vascular disease.  There was concern for sarcoidosis on CT chest but lymph node biopsy showed no evidence for sarcoidosis.  V/Q scan showed no evidence for chronic PE.   She presents today for PAH follow up.  We had to stop Revatio as she has had pain in all her joints. She is now on Opsumit.  She feels like this has helped her breathing.  She was able to walk into the office today without dyspnea.  She thinks she can walk 50-100 yards before becoming short of breath.  No syncope, lightheadedness, or chest pain. Weight is up 2 lbs.   6 minute walk (5/16): 238 meters => decreased oxygen saturation to 73%, increased to 6 L [place] 6 minute walk (7/16): 170 meters => unable to complete.  6 minute walk (9/16): 201 meters  Labs (2/16): BNP 69 Labs (5/16): LFTs normal, HCT 34.9, RF negative, HIV negative, TSH negative Labs (6/16): K 4.4, creatinine 0.88 Labs (7/16): ANA negative, BNP 58, K 4.3, creatinine 1.1, anti-RNP negative  PMH: 1. Pulmonary hypertension: RHC (5/16) with mean RA 18, PA 83/33 mean 53, mean PCWP 19, CI 2.61, PVR 5.8 WU.   Echo (5/16) with EF 65-70%, septal flattening, dilated RV with PA systolic pressure 99 mmHg, +bubble study.  PFTs (4/16) with FVC 76%, FEV1 75%, ratio 97%, TLC 66%, DLCO 26% => mild restriction, mild obstruction, severe diffuse abnormality (pulmonary vascular  problem).  CTA chest (4/16) with chronic interstitial and obstructive lung disease, small mediastinal and hilar lymph noes, no PE.  Lymph node biopsy negative for sarcoidosis (reactive changes).  V/Q scan (6/16): No acute or chronic PE. TEE (6/16): Normal LV size and systolic function, EF 55-60%,RV was mildly dilated with mildly decreased systolic function, D-shaped interventricular septum suggestive of RV pressure/volume overload, there appeared to be a small PFO present with some bubbles crossing, no ASD.  ANA negative, anti-RNP negative, HIV negative. Sleep study (8/16) without OSA.  2. Chronic venous insufficiency. 3. Hyperlipidemia.  4. GERD 5. Chronic hypoxemic respiratory failure: Hypoxemia noted x 6 years at least.    SH: Married, quit smoking in 3/16.  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    FH: No pulmonary hypertension, no premature CAD, no sudden death.   ROS: All systems reviewed and negative except as per HPI.    Current Outpatient Prescriptions  Medication Sig Dispense Refill  . albuterol (PROVENTIL HFA;VENTOLIN HFA) 108 (90 BASE) MCG/ACT inhaler Inhale 1-2 puffs into the lungs every 6 (six) hours as needed for wheezing or shortness of breath. 1 Inhaler 0  . cetirizine (ZYRTEC) 10 MG tablet Take 10 mg by mouth daily.    . citalopram (CELEXA) 20 MG tablet Take 40 mg by mouth every morning.     . famotidine (PEPCID) 20 MG tablet Take 20 mg by mouth daily as needed for heartburn.     Marland Kitchen  furosemide (LASIX) 40 MG tablet Take 80 mg every AM take 40 mg every other PM ALTERNATING with 80 mg every other PM 90 tablet 3  . HYDROcodone-acetaminophen (NORCO) 10-325 MG per tablet Take 1-2 tablets by mouth every 6 (six) hours as needed for moderate pain or severe pain.     . Macitentan (OPSUMIT) 10 MG TABS Take 1 tablet by mouth daily. 30 tablet   . meloxicam (MOBIC) 7.5 MG tablet Take 7.5 mg by mouth daily.    Marland Kitchen omeprazole (PRILOSEC) 20 MG capsule Take 20 mg by mouth daily as  needed (for acid reflux).     . Potassium Chloride ER 20 MEQ TBCR Take 40 mg by mouth daily. 30 tablet 3  . pravastatin (PRAVACHOL) 20 MG tablet Take 20 mg by mouth daily.    . Vitamin D, Ergocalciferol, (DRISDOL) 50000 UNITS CAPS capsule Take 50,000 Units by mouth every Monday.      No current facility-administered medications for this encounter.   BP 100/58 mmHg  Pulse 65  Wt 258 lb 12.8 oz (117.391 kg)  SpO2 89%  General: NAD Neck: JVP 8-9 cm, no thyromegaly or thyroid nodule.  Lungs:  Dry crackles right base.  CV: Nondisplaced PMI.  Heart regular S1/S2, no S3, no murmur.  1+ ankle edema.  No carotid bruit.  Normal pedal pulses.  Abdomen: Soft, nontender, no hepatosplenomegaly, no distention.  Skin: Intact without lesions or rashes.  Neurologic: Alert and oriented x 3.  Psych: Normal affect. Extremities: No clubbing or cyanosis.  HEENT: Normal.   Assessment/Plan:  1. 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 Etiology uncertain.  Underlying lung disease not markedly severe by PFTs.  Restrictive PFTs thought to be due to enlarged heart in chest and obesity.  There was concern from CTA chest for sarcoidosis, but lymph node biopsy was negative.  LFTs normal, ANA negative, anti-RNP negative, HIV negative.  V/Q scan negative for chronic PE.  Small PFO but no ASD on TEE.  No OSA on sleep study.  She did not tolerate Revatio due to joint pain (now resolved).   On exam today, she is volume overloaded with NYHA class III symptoms.  - Open lung biopsy deemed too risky, probably not sarcoid.   - Increase Lasix to 80 mg bid alternating with 80 mg qam/40 mg qpm .  BMET/BNP today.  - She has had subjective and objective (6 minute walk) improvement with Opsumit.  I will now add Adcirca 20 mg daily.  If she gets joint pain with Adcirca like she did with Revatio, selexipag will be an option.   2. Abnormal chest CT: Chronic interstitial and obstructive lung disease, mediastinal and hilar  lymphadenopathy.  Concern for sarcoidosis.  PFTs showed only mild restriction and obstruction.  Biopsy did not show sarcoid.  Deemed too high risk for open lung biopsy.  Restriction on PFTs may be due to body habitus.  3. Chronic hypoxemic respiratory failure: Continue oxygen up to 6 L with activity.   Followup in 1 month  [name]  08/25/2015

## 2015-09-22 ENCOUNTER — Telehealth (HOSPITAL_COMMUNITY): Payer: Self-pay

## 2015-09-22 NOTE — Telephone Encounter (Signed)
Coricidin ok as decongestant.

## 2015-09-22 NOTE — Telephone Encounter (Signed)
Patient called with head congestion.  No chest congestion, fever or chills.  Scared to take anything over the counter and would like recommendations  Told her to stay away from advil (ibuprofen), pseudoephedrine, phenylephrine  Will route to [place] and [name] for recommendations.

## 2015-09-22 NOTE — Telephone Encounter (Signed)
Called patient back and gave her the recommendation from [name]. (Coricidin)

## 2015-09-25 ENCOUNTER — Encounter (HOSPITAL_COMMUNITY): Payer: Self-pay

## 2015-09-25 ENCOUNTER — Ambulatory Visit (HOSPITAL_COMMUNITY)
Admission: RE | Admit: 2015-09-25 | Discharge: 2015-09-25 | Disposition: A | Discharge status: Home / Self Care | Payer: No Typology Code available for payment source | Source: Ambulatory Visit | Attending: Cardiology | Admitting: Cardiology

## 2015-09-25 VITALS — BP 94/56 | HR 68 | Wt 254.8 lb

## 2015-09-25 DIAGNOSIS — J849 Interstitial pulmonary disease, unspecified: Secondary | ICD-10-CM | POA: Insufficient documentation

## 2015-09-25 DIAGNOSIS — D869 Sarcoidosis, unspecified: Secondary | ICD-10-CM | POA: Diagnosis not present

## 2015-09-25 DIAGNOSIS — I2721 Secondary pulmonary arterial hypertension: Secondary | ICD-10-CM

## 2015-09-25 DIAGNOSIS — J9611 Chronic respiratory failure with hypoxia: Secondary | ICD-10-CM

## 2015-09-25 DIAGNOSIS — I272 Other secondary pulmonary hypertension: Secondary | ICD-10-CM | POA: Insufficient documentation

## 2015-09-25 LAB — BASIC METABOLIC PANEL
Anion gap: 8 (ref 5–15)
BUN: 14 mg/dL (ref 6–20)
CALCIUM: 9.3 mg/dL (ref 8.9–10.3)
CO2: 30 mmol/L (ref 22–32)
Chloride: 102 mmol/L (ref 101–111)
Creatinine, Ser: 1.04 mg/dL — ABNORMAL HIGH (ref 0.44–1.00)
GFR calc Af Amer: >60 mL/min (ref >60)
GFR, EST NON AFRICAN AMERICAN: 58 mL/min — AB (ref >60)
Glucose, Bld: 123 mg/dL — ABNORMAL HIGH (ref 65–99)
Potassium: 4.3 mmol/L (ref 3.5–5.1)
Sodium: 140 mmol/L (ref 135–145)

## 2015-09-25 MED ORDER — DOXYCYCLINE HYCLATE 100 MG PO CAPS: 100.0000 mg | ORAL_CAPSULE | Freq: Two times a day (BID) | ORAL | Status: DC

## 2015-09-25 NOTE — Progress Notes (Signed)
Patient ID: [name], female   DOB: 03/29/1956, 59 y.o.   MRN: [id number] PCP: Prudy Feeler Pulmonology: Dr [name]  59 yo with history of chronic hypoxemic respiratory failure and pulmonary hypertension.  She has been followed by Dr [name] with pulmonology.  She says that she has been on home oxygen for 6 years.  She quit smoking in 2016.  She had an echo done in 5/16 showing dilated RV with severe pulmonary hypertension.  She therefore had RHC in 5/16 which confirmed severe pulmonary hypertension and RV failure.  PFTs showed only mild obstruction and restriction with markedly decreased DLCO suggestive of pulmonary vascular disease.  There was concern for sarcoidosis on CT chest but lymph node biopsy showed no evidence for sarcoidosis.  V/Q scan showed no evidence for chronic PE.   She presents today for PAH follow up.  We had to stop Revatio as she has had pain in all her joints. She is now on Opsumit.  At last appointment, I increased her Lasix.  She has lost 4 lbs and is breathing better.  She can walk on flat ground without any problems.  She has not had lightheadedness or syncope.  No chest pain.  Main complaint today is upper airways congestion with cough.  She has been taking Coricidin.   6 minute walk (5/16): 238 meters => decreased oxygen saturation to 73%, increased to 6 L [place] 6 minute walk (7/16): 170 meters => unable to complete.  6 minute walk (9/16): 201 meters  Labs (2/16): BNP 69 Labs (5/16): LFTs normal, HCT 34.9, RF negative, HIV negative, TSH negative Labs (6/16): K 4.4, creatinine 0.88 Labs (7/16): ANA negative, BNP 58, K 4.3, creatinine 1.1, anti-RNP negative Labs (9/16): K 4.4, creatinine 1.26, BNP 91  PMH: 1. Pulmonary hypertension: RHC (5/16) with mean RA 18, PA 83/33 mean 53, mean PCWP 19, CI 2.61, PVR 5.8 WU.   Echo (5/16) with EF 65-70%, septal flattening, dilated RV with PA systolic pressure 99 mmHg, +bubble study.  PFTs (4/16) with FVC 76%, FEV1 75%, ratio 97%, TLC 66%,  DLCO 26% => mild restriction, mild obstruction, severe diffuse abnormality (pulmonary vascular problem).  CTA chest (4/16) with chronic interstitial and obstructive lung disease, small mediastinal and hilar lymph noes, no PE.  Lymph node biopsy negative for sarcoidosis (reactive changes).  V/Q scan (6/16): No acute or chronic PE. TEE (6/16): Normal LV size and systolic function, EF 55-60%,RV was mildly dilated with mildly decreased systolic function, D-shaped interventricular septum suggestive of RV pressure/volume overload, there appeared to be a small PFO present with some bubbles crossing, no ASD.  ANA negative, anti-RNP negative, HIV negative. Sleep study (8/16) without OSA.  2. Chronic venous insufficiency. 3. Hyperlipidemia.  4. GERD 5. Chronic hypoxemic respiratory failure: Hypoxemia noted x 6 years at least.    SH: Married, quit smoking in 3/16.  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    FH: No pulmonary hypertension, no premature CAD, no sudden death.   ROS: All systems reviewed and negative except as per HPI.    Current Outpatient Prescriptions  Medication Sig Dispense Refill  . albuterol (PROVENTIL HFA;VENTOLIN HFA) 108 (90 BASE) MCG/ACT inhaler Inhale 1-2 puffs into the lungs every 6 (six) hours as needed for wheezing or shortness of breath. 1 Inhaler 0  . cetirizine (ZYRTEC) 10 MG tablet Take 10 mg by mouth daily.    . citalopram (CELEXA) 20 MG tablet Take 40 mg by mouth every morning.     Marland Kitchen  famotidine (PEPCID) 20 MG tablet Take 20 mg by mouth daily as needed for heartburn.     . furosemide (LASIX) 40 MG tablet Take 80 mg every AM take 40 mg every other PM ALTERNATING with 80 mg every other PM 90 tablet 3  . HYDROcodone-acetaminophen (NORCO) 10-325 MG per tablet Take 1-2 tablets by mouth every 6 (six) hours as needed for moderate pain or severe pain.     . Macitentan (OPSUMIT) 10 MG TABS Take 1 tablet by mouth daily. 30 tablet   . meloxicam (MOBIC) 7.5 MG tablet Take  7.5 mg by mouth daily.    Marland Kitchen omeprazole (PRILOSEC) 20 MG capsule Take 20 mg by mouth daily as needed (for acid reflux).     . Potassium Chloride ER 20 MEQ TBCR Take 40 mg by mouth daily. 30 tablet 3  . pravastatin (PRAVACHOL) 20 MG tablet Take 20 mg by mouth daily.    . Vitamin D, Ergocalciferol, (DRISDOL) 50000 UNITS CAPS capsule Take 50,000 Units by mouth every Monday.     Marland Kitchen doxycycline (VIBRAMYCIN) 100 MG capsule Take 1 capsule (100 mg total) by mouth 2 (two) times daily. 10 capsule 0   No current facility-administered medications for this encounter.   BP 94/56 mmHg  Pulse 68  Wt 254 lb 12.8 oz (115.577 kg)  SpO2 93%  General: NAD Neck: JVP 8 cm, no thyromegaly or thyroid nodule.  Lungs:  Rhonchi bilaterally.  CV: Nondisplaced PMI.  Heart regular S1/S2, no S3, no murmur.  Trace ankle edema.  No carotid bruit.  Normal pedal pulses.  Abdomen: Soft, nontender, no hepatosplenomegaly, no distention.  Skin: Intact without lesions or rashes.  Neurologic: Alert and oriented x 3.  Psych: Normal affect. Extremities: No clubbing or cyanosis.  HEENT: Normal.   Assessment/Plan:  1. 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  Underlying lung disease not markedly severe by PFTs.  Restrictive PFTs thought to be due to enlarged heart in chest and obesity.  There was concern from CTA chest for sarcoidosis, but lymph node biopsy was negative.  LFTs normal, ANA negative, anti-RNP negative, HIV negative.  V/Q scan negative for chronic PE.  Small PFO but no ASD on TEE.  No OSA on sleep study.  She did not tolerate Revatio due to joint pain (now resolved).   Opsumit has helped.  Symptoms improved on higher Lasix dose, now NYHA class II-III.  - Open lung biopsy deemed too risky, probably not sarcoid.   - Continue Lasix 80 mg bid alternating with 80 mg qam/40 mg qpm .  BMET today.  - She has had subjective and objective (6 minute walk) improvement with Opsumit.  I will now  add Adcirca 20 mg daily (Group I PAH, unable to take Revatio).  If she gets joint pain with Adcirca like she did with Revatio, selexipag will be an option.   2. Abnormal chest CT: Chronic interstitial and obstructive lung disease, mediastinal and hilar lymphadenopathy.  Concern for sarcoidosis.  PFTs showed only mild restriction and obstruction.  Biopsy did not show sarcoid.  Deemed too high risk for open lung biopsy.  Restriction on PFTs may be due to body habitus.  3. Chronic hypoxemic respiratory failure: Continue oxygen up to 6 L with activity.  4. Acute bronchitis: Symptoms concerning for acute bronchitis.  Given her tenuous cardiopulmonary status, I will have her take doxycycline 100 mg bid.  Continue Coricidin, may use guaifenisin for cough. I will get a CXR  to rule out PNA.    Followup in 1 month  [name]  09/25/2015

## 2015-09-25 NOTE — Patient Instructions (Addendum)
Start Doxycycline 100 mg twice a day  Chest Xray today  Labs today will call if abnormal   Follow up in 1 month

## 2015-09-25 NOTE — Progress Notes (Signed)
Medication Samples have been provided to the patient.  Drug name: Adcirci  Qty: 14  LOT: [id number]  Exp.Date: 08/2017  The patient has been instructed regarding the correct time, dose, and frequency of taking this medication, including desired effects and most common side effects.   [place] Pod D 11:00 AM 09/25/2015

## 2015-09-27 ENCOUNTER — Telehealth (HOSPITAL_COMMUNITY): Payer: Self-pay | Admitting: *Deleted

## 2015-09-27 NOTE — Telephone Encounter (Signed)
Completed PA for pt's Adcirca, it was originally denied and additional information was faxed back into [place], med was approved through 09/26/16, auth # [id number]

## 2015-10-27 ENCOUNTER — Ambulatory Visit (HOSPITAL_COMMUNITY)
Admission: RE | Admit: 2015-10-27 | Discharge: 2015-10-27 | Disposition: A | Discharge status: Home / Self Care | Payer: No Typology Code available for payment source | Source: Ambulatory Visit | Attending: Cardiology | Admitting: Cardiology

## 2015-10-27 ENCOUNTER — Telehealth (HOSPITAL_COMMUNITY): Payer: Self-pay | Admitting: Pharmacist

## 2015-10-27 VITALS — BP 98/62 | HR 73 | Wt 255.4 lb

## 2015-10-27 DIAGNOSIS — K219 Gastro-esophageal reflux disease without esophagitis: Secondary | ICD-10-CM | POA: Diagnosis not present

## 2015-10-27 DIAGNOSIS — E785 Hyperlipidemia, unspecified: Secondary | ICD-10-CM | POA: Diagnosis not present

## 2015-10-27 DIAGNOSIS — Z79899 Other long term (current) drug therapy: Secondary | ICD-10-CM | POA: Diagnosis not present

## 2015-10-27 DIAGNOSIS — J449 Chronic obstructive pulmonary disease, unspecified: Secondary | ICD-10-CM | POA: Diagnosis not present

## 2015-10-27 DIAGNOSIS — R590 Localized enlarged lymph nodes: Secondary | ICD-10-CM | POA: Diagnosis not present

## 2015-10-27 DIAGNOSIS — I272 Other secondary pulmonary hypertension: Secondary | ICD-10-CM | POA: Insufficient documentation

## 2015-10-27 DIAGNOSIS — J9611 Chronic respiratory failure with hypoxia: Secondary | ICD-10-CM | POA: Diagnosis not present

## 2015-10-27 DIAGNOSIS — I872 Venous insufficiency (chronic) (peripheral): Secondary | ICD-10-CM | POA: Insufficient documentation

## 2015-10-27 DIAGNOSIS — Z87891 Personal history of nicotine dependence: Secondary | ICD-10-CM | POA: Diagnosis not present

## 2015-10-27 DIAGNOSIS — I2721 Secondary pulmonary arterial hypertension: Secondary | ICD-10-CM

## 2015-10-27 NOTE — Patient Instructions (Signed)
Your physician recommends that you schedule a follow-up appointment in: 6 weeks with Dr.McLean  Do the following things EVERYDAY: 1) Weigh yourself in the morning before breakfast. Write it down and keep it in a log. 2) Take your medicines as prescribed 3) Eat low salt foods-Limit salt (sodium) to 2000 mg per day.  4) Stay as active as you can everyday 5) Limit all fluids for the day to less than 2 liters 6)

## 2015-10-27 NOTE — Progress Notes (Signed)
Patient ID: Kelly Donaldson, female   DOB: 11-Nov-1956, 59 y.o.   MRN: [id number]    PCP: Prudy Feeler Pulmonology: Dr [name] Primary HF: Dr Shirlee Latch   59 yo with history of chronic hypoxemic respiratory failure and pulmonary hypertension.  She has been followed by Dr [name] with pulmonology.  She says that she has been on home oxygen for 6 years.  She quit smoking in 2016.  She had an echo done in 5/16 showing dilated RV with severe pulmonary hypertension.  She therefore had RHC in 5/16 which confirmed severe pulmonary hypertension and RV failure.  PFTs showed only mild obstruction and restriction with markedly decreased DLCO suggestive of pulmonary vascular disease.  There was concern for sarcoidosis on CT chest but lymph node biopsy showed no evidence for sarcoidosis.  V/Q scan showed no evidence for chronic PE.   She presents today for PAH follow up.  Last visit adcirca started 20 mg daily and provided 20 day sample. Completed 20 day sample without difficulty.Doing ok. Does take frequent rest breaks when walking.  She has not had lightheadedness or syncope.  No chest pain. Taking all medications.   6 minute walk (5/16): 238 meters => decreased oxygen saturation to 73%, increased to 6 L [place] 6 minute walk (7/16): 170 meters => unable to complete.  6 minute walk (9/16): 201 meters  Labs (2/16): BNP 69 Labs (5/16): LFTs normal, HCT 34.9, RF negative, HIV negative, TSH negative Labs (6/16): K 4.4, creatinine 0.88 Labs (7/16): ANA negative, BNP 58, K 4.3, creatinine 1.1, anti-RNP negative Labs (9/16): K 4.4, creatinine 1.26, BNP 91  PMH: 1. Pulmonary hypertension: RHC (5/16) with mean RA 18, PA 83/33 mean 53, mean PCWP 19, CI 2.61, PVR 5.8 WU.   Echo (5/16) with EF 65-70%, septal flattening, dilated RV with PA systolic pressure 99 mmHg, +bubble study.  PFTs (4/16) with FVC 76%, FEV1 75%, ratio 97%, TLC 66%, DLCO 26% => mild restriction, mild obstruction, severe diffuse abnormality (pulmonary vascular  problem).  CTA chest (4/16) with chronic interstitial and obstructive lung disease, small mediastinal and hilar lymph noes, no PE.  Lymph node biopsy negative for sarcoidosis (reactive changes).  V/Q scan (6/16): No acute or chronic PE. TEE (6/16): Normal LV size and systolic function, EF 55-60%,RV was mildly dilated with mildly decreased systolic function, D-shaped interventricular septum suggestive of RV pressure/volume overload, there appeared to be a small PFO present with some bubbles crossing, no ASD.  ANA negative, anti-RNP negative, HIV negative. Sleep study (8/16) without OSA.  2. Chronic venous insufficiency. 3. Hyperlipidemia.  4. GERD 5. Chronic hypoxemic respiratory failure: Hypoxemia noted x 6 years at least.    SH: Married, quit smoking in 3/16.  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    FH: No pulmonary hypertension, no premature CAD, no sudden death.   ROS: All systems reviewed and negative except as per HPI.    Current Outpatient Prescriptions  Medication Sig Dispense Refill  . albuterol (PROVENTIL HFA;VENTOLIN HFA) 108 (90 BASE) MCG/ACT inhaler Inhale 1-2 puffs into the lungs every 6 (six) hours as needed for wheezing or shortness of breath. 1 Inhaler 0  . cetirizine (ZYRTEC) 10 MG tablet Take 10 mg by mouth daily.    . citalopram (CELEXA) 20 MG tablet Take 40 mg by mouth every morning.     . famotidine (PEPCID) 20 MG tablet Take 20 mg by mouth daily as needed for heartburn.     . furosemide (LASIX) 40 MG tablet Take  80 mg every AM take 40 mg every other PM ALTERNATING with 80 mg every other PM 90 tablet 3  . HYDROcodone-acetaminophen (NORCO) 10-325 MG per tablet Take 1-2 tablets by mouth every 6 (six) hours as needed for moderate pain or severe pain.     . Macitentan (OPSUMIT) 10 MG TABS Take 1 tablet by mouth daily. 30 tablet   . meloxicam (MOBIC) 7.5 MG tablet Take 7.5 mg by mouth daily.    Marland Kitchen omeprazole (PRILOSEC) 20 MG capsule Take 20 mg by mouth daily as  needed (for acid reflux).     . potassium chloride SA (K-DUR,KLOR-CON) 20 MEQ tablet Take 20 mEq by mouth daily.    . pravastatin (PRAVACHOL) 20 MG tablet Take 20 mg by mouth daily.    . Vitamin D, Ergocalciferol, (DRISDOL) 50000 UNITS CAPS capsule Take 50,000 Units by mouth every Monday.      No current facility-administered medications for this encounter.   BP 98/62 mmHg  Pulse 73  Wt 255 lb 6.4 oz (115.849 kg)  SpO2 94%  General: NAD. Husband present Neck: JVP 6-7 cm, no thyromegaly or thyroid nodule.  Lungs:  Rhonchi bilaterally.  CV: Nondisplaced PMI.  Heart regular S1/S2, no S3, no murmur.  Trace ankle edema.  No carotid bruit.  Normal pedal pulses.  Abdomen: Soft, nontender, no hepatosplenomegaly, no distention.  Skin: Intact without lesions or rashes.  Neurologic: Alert and oriented x 3.  Psych: Normal affect. Extremities: No clubbing or cyanosis.  HEENT: Normal.   Assessment/Plan:  1. 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  Underlying lung disease not markedly severe by PFTs.  Restrictive PFTs thought to be due to enlarged heart in chest and obesity.  There was concern from CTA chest for sarcoidosis, but lymph node biopsy was negative.  LFTs normal, ANA negative, anti-RNP negative, HIV negative.  V/Q scan negative for chronic PE.  Small PFO but no ASD on TEE.  No OSA on sleep study.  She did not tolerate Revatio due to joint pain (now resolved).   Opsumit has helped.  Symptoms improved on higher Lasix dose, now NYHA class II-III.  - Open lung biopsy deemed too risky, probably not sarcoid.   - Continue Lasix 80 mg bid alternating with 80 mg qam/40 mg qpm .   - She has had subjective and objective (6 minute walk) improvement with Opsumit.   Adicirca added (provided with samples) and she tolerated 20 mg for 20 days.  Now waiting on insurance approval. Application has been presented to insurance company. We will follow up with [employer].    (Group I PAH, unable to take Revatio).  If she gets joint pain with Adcirca like she did with Revatio, selexipag will be an option.   2. Abnormal chest CT: Chronic interstitial and obstructive lung disease, mediastinal and hilar lymphadenopathy.  Concern for sarcoidosis.  PFTs showed only mild restriction and obstruction.  Biopsy did not show sarcoid.  Deemed too high risk for open lung biopsy.  Restriction on PFTs may be due to body habitus.  3. Chronic hypoxemic respiratory failure: Continue oxygen up to 6 L with activity.  4. H/O Acute bronchitis: resolved.    Follow up in 6 weeks. Plan to at next visit.   Theodosia Bahena NP-C  10/27/2015

## 2015-10-27 NOTE — Telephone Encounter (Signed)
Spoke with pharmacist at [facility] who states that they were not aware of approval for Adcirca. She states that they will now speak with patient to set up delivery.   Kelly Donaldson. Bonnye Fava, PharmD, BCPS, CPP Clinical Pharmacist Pager: [phone] Phone: [phone] 10/27/2015 12:45 PM

## 2015-11-07 ENCOUNTER — Telehealth (HOSPITAL_COMMUNITY): Payer: Self-pay

## 2015-11-07 NOTE — Telephone Encounter (Signed)
Patient said that she got a letter from [facility] wanting her to contact [employer].  Can you call her Cicero Duck ans see what she needs? She said you can call her anytime tomorrow

## 2015-11-28 ENCOUNTER — Other Ambulatory Visit (HOSPITAL_COMMUNITY): Payer: Self-pay | Admitting: *Deleted

## 2015-11-28 MED ORDER — POTASSIUM CHLORIDE CRYS ER 20 MEQ PO TBCR: 20.0000 meq | EXTENDED_RELEASE_TABLET | Freq: Every day | ORAL | Status: DC

## 2015-11-29 ENCOUNTER — Other Ambulatory Visit (HOSPITAL_COMMUNITY): Payer: Self-pay | Admitting: *Deleted

## 2015-11-29 MED ORDER — TADALAFIL (PAH) 20 MG PO TABS: 20.0000 mg | ORAL_TABLET | Freq: Every day | ORAL | Status: DC

## 2015-11-29 NOTE — Telephone Encounter (Signed)
Pt's Adcirca approved 09/27/15-09/26/16 auth # [id number] and [facility] was aware, however they state they can not provide pt's medication due to her insurance it must go through [employer], rx sent to them

## 2015-12-08 ENCOUNTER — Ambulatory Visit (HOSPITAL_COMMUNITY)
Admission: RE | Admit: 2015-12-08 | Discharge: 2015-12-08 | Disposition: A | Discharge status: Home / Self Care | Payer: No Typology Code available for payment source | Source: Ambulatory Visit | Attending: Cardiology | Admitting: Cardiology

## 2015-12-08 ENCOUNTER — Encounter (HOSPITAL_COMMUNITY): Payer: Self-pay

## 2015-12-08 VITALS — BP 87/50 | HR 86 | Resp 22 | Wt 259.8 lb

## 2015-12-08 DIAGNOSIS — I2721 Secondary pulmonary arterial hypertension: Secondary | ICD-10-CM

## 2015-12-08 DIAGNOSIS — I272 Other secondary pulmonary hypertension: Secondary | ICD-10-CM

## 2015-12-08 DIAGNOSIS — R0602 Shortness of breath: Secondary | ICD-10-CM | POA: Insufficient documentation

## 2015-12-08 DIAGNOSIS — R918 Other nonspecific abnormal finding of lung field: Secondary | ICD-10-CM | POA: Diagnosis not present

## 2015-12-08 DIAGNOSIS — R0600 Dyspnea, unspecified: Secondary | ICD-10-CM | POA: Diagnosis not present

## 2015-12-08 DIAGNOSIS — R05 Cough: Secondary | ICD-10-CM | POA: Diagnosis not present

## 2015-12-08 DIAGNOSIS — D869 Sarcoidosis, unspecified: Secondary | ICD-10-CM | POA: Diagnosis not present

## 2015-12-08 DIAGNOSIS — R059 Cough, unspecified: Secondary | ICD-10-CM

## 2015-12-08 LAB — BASIC METABOLIC PANEL
ANION GAP: 10 (ref 5–15)
BUN: 13 mg/dL (ref 6–20)
CHLORIDE: 102 mmol/L (ref 101–111)
CO2: 27 mmol/L (ref 22–32)
CREATININE: 1.07 mg/dL — AB (ref 0.44–1.00)
Calcium: 9 mg/dL (ref 8.9–10.3)
GFR calc non Af Amer: 56 mL/min — ABNORMAL LOW (ref >60)
GLUCOSE: 99 mg/dL (ref 65–99)
Potassium: 4.2 mmol/L (ref 3.5–5.1)
Sodium: 139 mmol/L (ref 135–145)

## 2015-12-08 LAB — CBC
HEMATOCRIT: 31.6 % — AB (ref 36.0–46.0)
Hemoglobin: 9.9 g/dL — ABNORMAL LOW (ref 12.0–15.0)
MCH: 26.3 pg (ref 26.0–34.0)
MCHC: 31.3 g/dL (ref 30.0–36.0)
MCV: 84 fL (ref 78.0–100.0)
Platelets: 184 10*3/uL (ref 150–400)
RBC: 3.76 MIL/uL — AB (ref 3.87–5.11)
RDW: 14.5 % (ref 11.5–15.5)
WBC: 7.8 10*3/uL (ref 4.0–10.5)

## 2015-12-08 LAB — BRAIN NATRIURETIC PEPTIDE: B Natriuretic Peptide: 130.2 pg/mL — ABNORMAL HIGH (ref 0.0–100.0)

## 2015-12-08 MED ORDER — HYDROCODONE-ACETAMINOPHEN 10-325 MG PO TABS: 1.0000 | ORAL_TABLET | Freq: Four times a day (QID) | ORAL | Status: DC | PRN

## 2015-12-08 MED ORDER — FUROSEMIDE 40 MG PO TABS: 80.0000 mg | ORAL_TABLET | Freq: Two times a day (BID) | ORAL | Status: DC

## 2015-12-08 NOTE — Patient Instructions (Signed)
STOP Adcirca.  INCREASE Lasix to 120 mg in am and 80 mg in pm for FOUR DAYS. Then, take lasix 80 mg twice daily.  Routine lab work today. Will notify you of abnormal results, otherwise no news is good news!  Chest xray today to rule out pneumonia.  INCREASE oxygen to 4 liters with rest.  Follow up 2 weeks.

## 2015-12-08 NOTE — Progress Notes (Signed)
Advanced Heart Failure Medication Review by a Pharmacist  Does the patient  feel that his/her medications are working for him/her?  yes  Has the patient been experiencing any side effects to the medications prescribed?  yes  Does the patient measure his/her own blood pressure or blood glucose at home?  no   Does the patient have any problems obtaining medications due to transportation or finances?   no  Understanding of regimen: good Understanding of indications: good Potential of compliance: good Patient understands to avoid NSAIDs. Patient understands to avoid decongestants.  Issues to address at subsequent visits: None   Pharmacist comments:  Ms. Escudero is a pleasant 59 yo F presenting without her medication list but with good recall of her regimen including dosages. She reports good compliance with her medications but does report worsening SOB since starting Adcirca. All other medications have been stable without any recent changes. She does state that recently she has required additional lasix in the evening for swelling in her left leg.   Tyler Deis. Bonnye Fava, PharmD, BCPS, CPP Clinical Pharmacist Pager: [phone] Phone: [phone] 12/08/2015 12:02 PM    Time with patient: 10 minutes Preparation and documentation time: 2 minutes Total time: 12 minutes

## 2015-12-10 NOTE — Progress Notes (Signed)
Patient ID: Kelly Donaldson, female   DOB: 1956-07-18, 61 y.o.   MRN: [id number]   PCP: Prudy Feeler Pulmonology: Dr [name] Primary HF: Dr Shirlee Latch   59 yo with history of chronic hypoxemic respiratory failure and pulmonary hypertension.  She has been followed by Dr [name] with pulmonology.  She says that she has been on home oxygen for 6 years.  She quit smoking in 2016.  She had an echo done in 5/16 showing dilated RV with severe pulmonary hypertension.  She therefore had RHC in 5/16 which confirmed severe pulmonary hypertension and RV failure.  PFTs showed only mild obstruction and restriction with markedly decreased DLCO suggestive of pulmonary vascular disease.  There was concern for sarcoidosis on CT chest but lymph node biopsy showed no evidence for sarcoidosis.  V/Q scan showed no evidence for chronic PE.   She presents today for PAH follow up.  She did better after starting macitentan, but since starting Adcirca, she has started to get considerably more short of breath.  She has been lightheaded.  Weight is up 4 lbs.  SBP in 80s today.  Oxygen saturation 85% on 3L [place], had to turn up oxygen.  She has a cough and chest congestion.  Dyspnea walking a short distance (just going out of house to her car).  Using 5L oxygen with ambulation.    6 minute walk (5/16): 238 meters => decreased oxygen saturation to 73%, increased to 6 L [place] 6 minute walk (7/16): 170 meters => unable to complete.  6 minute walk (9/16): 201 meters  Labs (2/16): BNP 69 Labs (5/16): LFTs normal, HCT 34.9, RF negative, HIV negative, TSH negative Labs (6/16): K 4.4, creatinine 0.88 Labs (7/16): ANA negative, BNP 58, K 4.3, creatinine 1.1, anti-RNP negative Labs (9/16): K 4.4, creatinine 1.26, BNP 91 Labs (10/16): K 4.3, creatinine 1.04  PMH: 1. Pulmonary hypertension: RHC (5/16) with mean RA 18, PA 83/33 mean 53, mean PCWP 19, CI 2.61, PVR 5.8 WU.   Echo (5/16) with EF 65-70%, septal flattening, dilated RV with PA systolic pressure  99 mmHg, +bubble study.  PFTs (4/16) with FVC 76%, FEV1 75%, ratio 97%, TLC 66%, DLCO 26% => mild restriction, mild obstruction, severe diffuse abnormality (pulmonary vascular problem).  CTA chest (4/16) with chronic interstitial and obstructive lung disease, small mediastinal and hilar lymph noes, no PE.  Lymph node biopsy negative for sarcoidosis (reactive changes).  V/Q scan (6/16): No acute or chronic PE. TEE (6/16): Normal LV size and systolic function, EF 55-60%,RV was mildly dilated with mildly decreased systolic function, D-shaped interventricular septum suggestive of RV pressure/volume overload, there appeared to be a small PFO present with some bubbles crossing, no ASD.  ANA negative, anti-RNP negative, HIV negative. Sleep study (8/16) without OSA.  2. Chronic venous insufficiency. 3. Hyperlipidemia.  4. GERD 5. Chronic hypoxemic respiratory failure: Hypoxemia noted x 6 years at least.    SH: Married, quit smoking in 3/16.  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    FH: No pulmonary hypertension, no premature CAD, no sudden death.   ROS: All systems reviewed and negative except as per HPI.    Current Outpatient Prescriptions  Medication Sig Dispense Refill  . albuterol (PROVENTIL HFA;VENTOLIN HFA) 108 (90 BASE) MCG/ACT inhaler Inhale 1-2 puffs into the lungs every 6 (six) hours as needed for wheezing or shortness of breath. 1 Inhaler 0  . cetirizine (ZYRTEC) 10 MG tablet Take 10 mg by mouth daily.    . citalopram (CELEXA)  20 MG tablet Take 40 mg by mouth every morning.     . famotidine (PEPCID) 20 MG tablet Take 20 mg by mouth daily as needed for heartburn.     . furosemide (LASIX) 40 MG tablet Take 2 tablets (80 mg total) by mouth 2 (two) times daily. 120 tablet 3  . HYDROcodone-acetaminophen (NORCO) 10-325 MG tablet Take 1-2 tablets by mouth every 6 (six) hours as needed for moderate pain or severe pain. 30 tablet 0  . Macitentan (OPSUMIT) 10 MG TABS Take 1 tablet by  mouth daily. 30 tablet   . meloxicam (MOBIC) 7.5 MG tablet Take 7.5 mg by mouth daily.    Marland Kitchen omeprazole (PRILOSEC) 20 MG capsule Take 20 mg by mouth daily as needed (for acid reflux).     . potassium chloride SA (K-DUR,KLOR-CON) 20 MEQ tablet Take 1 tablet (20 mEq total) by mouth daily. 30 tablet 6  . pravastatin (PRAVACHOL) 20 MG tablet Take 20 mg by mouth daily.    . Vitamin D, Ergocalciferol, (DRISDOL) 50000 UNITS CAPS capsule Take 50,000 Units by mouth every Monday.      No current facility-administered medications for this encounter.   BP 87/50 mmHg  Pulse 86  Resp 22  Wt 259 lb 12 oz (117.822 kg)  SpO2 85%  General: NAD. Husband present Neck: JVP 10-11 cm, no thyromegaly or thyroid nodule.  Lungs:  Rhonchi bilaterally.  CV: Nondisplaced PMI.  Heart regular S1/S2, no S3, no murmur.  1+ ankle edema.  No carotid bruit.  Normal pedal pulses.  Abdomen: Soft, nontender, no hepatosplenomegaly, no distention.  Skin: Intact without lesions or rashes.  Neurologic: Alert and oriented x 3.  Psych: Normal affect. Extremities: No clubbing or cyanosis.  HEENT: Normal.   Assessment/Plan:  1. 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  Underlying lung disease not markedly severe by PFTs.  Restrictive PFTs thought to be due to enlarged heart in chest and obesity.  There was concern from CTA chest for sarcoidosis, but lymph node biopsy was negative.  LFTs normal, ANA negative, anti-RNP negative, HIV negative.  V/Q scan negative for chronic PE.  Small PFO but no ASD on TEE.  No OSA on sleep study.  She did not tolerate Revatio due to joint pain (now resolved).   Opsumit helped.  She has done poorly since starting Adcirca and is hypotensive today.  She is volume overloaded on exam.  - Open lung biopsy deemed too risky, probably not sarcoid.   - Increase Lasix to 120 qam/80 qpm x 4 days, then 80 mg bid.   - She had subjective and objective (6 minute walk) improvement  with Opsumit but has worsened on Adcirca.  I will have her stop Adcirca.  In the future, would look into getting her on Selexipag.   - Needs followup in 2 weeks to make sure that she is doing better.  - CXR today.  - BMET/BNP today.  - Increase oxygen to 4L at rest.  2. Abnormal chest CT: Chronic interstitial and obstructive lung disease, mediastinal and hilar lymphadenopathy.  Concern for sarcoidosis.  PFTs showed only mild restriction and obstruction.  Biopsy did not show sarcoid.  Deemed too high risk for open lung biopsy.  Restriction on PFTs may be due to body habitus.  3. Chronic hypoxemic respiratory failure: Continue oxygen up to 5 L with activity, increase to 4L at rest.   Followup in 2 wks  [name]  12/10/2015

## 2015-12-12 ENCOUNTER — Other Ambulatory Visit (HOSPITAL_COMMUNITY): Payer: Self-pay

## 2015-12-12 ENCOUNTER — Telehealth (HOSPITAL_COMMUNITY): Payer: Self-pay

## 2015-12-12 MED ORDER — POTASSIUM CHLORIDE CRYS ER 20 MEQ PO TBCR: 20.0000 meq | EXTENDED_RELEASE_TABLET | Freq: Every day | ORAL | Status: DC

## 2015-12-12 MED ORDER — FUROSEMIDE 40 MG PO TABS: 80.0000 mg | ORAL_TABLET | Freq: Two times a day (BID) | ORAL | Status: DC

## 2015-12-12 NOTE — Telephone Encounter (Signed)
Patient called CHF triage line to request refills on lasix and potassium. Refills sent to preferred pharmacy electronically. Patient aware and appreciative.  Ave Filter

## 2015-12-27 ENCOUNTER — Ambulatory Visit (HOSPITAL_COMMUNITY)
Admission: RE | Admit: 2015-12-27 | Discharge: 2015-12-27 | Disposition: A | Discharge status: Home / Self Care | Payer: No Typology Code available for payment source | Source: Ambulatory Visit | Attending: Cardiology | Admitting: Cardiology

## 2015-12-27 VITALS — BP 96/60 | HR 61 | Wt 253.8 lb

## 2015-12-27 DIAGNOSIS — Z7982 Long term (current) use of aspirin: Secondary | ICD-10-CM | POA: Insufficient documentation

## 2015-12-27 DIAGNOSIS — I272 Other secondary pulmonary hypertension: Secondary | ICD-10-CM | POA: Diagnosis not present

## 2015-12-27 DIAGNOSIS — K219 Gastro-esophageal reflux disease without esophagitis: Secondary | ICD-10-CM | POA: Insufficient documentation

## 2015-12-27 DIAGNOSIS — Z87891 Personal history of nicotine dependence: Secondary | ICD-10-CM | POA: Diagnosis not present

## 2015-12-27 DIAGNOSIS — R590 Localized enlarged lymph nodes: Secondary | ICD-10-CM | POA: Insufficient documentation

## 2015-12-27 DIAGNOSIS — J9611 Chronic respiratory failure with hypoxia: Secondary | ICD-10-CM | POA: Diagnosis not present

## 2015-12-27 DIAGNOSIS — R0600 Dyspnea, unspecified: Secondary | ICD-10-CM

## 2015-12-27 DIAGNOSIS — I872 Venous insufficiency (chronic) (peripheral): Secondary | ICD-10-CM | POA: Insufficient documentation

## 2015-12-27 DIAGNOSIS — E785 Hyperlipidemia, unspecified: Secondary | ICD-10-CM | POA: Diagnosis not present

## 2015-12-27 DIAGNOSIS — I2721 Secondary pulmonary arterial hypertension: Secondary | ICD-10-CM

## 2015-12-27 DIAGNOSIS — J449 Chronic obstructive pulmonary disease, unspecified: Secondary | ICD-10-CM | POA: Insufficient documentation

## 2015-12-27 DIAGNOSIS — Z79899 Other long term (current) drug therapy: Secondary | ICD-10-CM | POA: Diagnosis not present

## 2015-12-27 DIAGNOSIS — Z9981 Dependence on supplemental oxygen: Secondary | ICD-10-CM | POA: Diagnosis not present

## 2015-12-27 LAB — BASIC METABOLIC PANEL
ANION GAP: 10 (ref 5–15)
BUN: 18 mg/dL (ref 6–20)
CO2: 29 mmol/L (ref 22–32)
Calcium: 9.2 mg/dL (ref 8.9–10.3)
Chloride: 100 mmol/L — ABNORMAL LOW (ref 101–111)
Creatinine, Ser: 1.23 mg/dL — ABNORMAL HIGH (ref 0.44–1.00)
GFR calc Af Amer: 55 mL/min — ABNORMAL LOW (ref >60)
GFR calc non Af Amer: 47 mL/min — ABNORMAL LOW (ref >60)
GLUCOSE: 100 mg/dL — AB (ref 65–99)
POTASSIUM: 4.3 mmol/L (ref 3.5–5.1)
Sodium: 139 mmol/L (ref 135–145)

## 2015-12-27 LAB — BRAIN NATRIURETIC PEPTIDE: B Natriuretic Peptide: 42.3 pg/mL (ref 0.0–100.0)

## 2015-12-27 MED ORDER — ASPIRIN EC 81 MG PO TBEC: 81.0000 mg | DELAYED_RELEASE_TABLET | Freq: Every day | ORAL | Status: DC

## 2015-12-27 NOTE — Progress Notes (Signed)
Patient ID: Kelly Donaldson, female   DOB: 1956-08-17, 60 y.o.   MRN: [id number]   PCP: Prudy Feeler Pulmonology: Dr [name] Primary HF: Dr Shirlee Latch   60 yo with history of chronic hypoxemic respiratory failure and pulmonary hypertension.  She has been followed by Dr [name] with pulmonology.  She says that she has been on home oxygen for 6 years.  She quit smoking in 2016.  She had an echo done in 5/16 showing dilated RV with severe pulmonary hypertension.  She therefore had RHC in 5/16 which confirmed severe pulmonary hypertension and RV failure.  PFTs showed only mild obstruction and restriction with markedly decreased DLCO suggestive of pulmonary vascular disease.  There was concern for sarcoidosis on CT chest but lymph node biopsy showed no evidence for sarcoidosis.  V/Q scan showed no evidence for chronic PE.   At last appointment, she was doing poorly.  Very short of breath with any exertion and requiring more oxygen.  She was volume overloaded on exam but also seemed to be tolerating Adcirca poorly. I increased her Lasix for a few days and stopped her Adcirca. She now feels much better.  She can walk a block now without dyspnea.  Weight is down 6 lbs.  She is using oxygen 3L at rest and 5L with exertion. No lightheadedness.   6 minute walk (5/16): 238 meters => decreased oxygen saturation to 73%, increased to 6 L [place] 6 minute walk (7/16): 170 meters => unable to complete.  6 minute walk (9/16): 201 meters 6 minute walk (1/17): 256 meters  Labs (2/16): BNP 69 Labs (5/16): LFTs normal, HCT 34.9, RF negative, HIV negative, TSH negative Labs (6/16): K 4.4, creatinine 0.88 Labs (7/16): ANA negative, BNP 58, K 4.3, creatinine 1.1, anti-RNP negative Labs (9/16): K 4.4, creatinine 1.26, BNP 91 Labs (10/16): K 4.3, creatinine 1.04 Lbas (12/16): K 4.2, creatinine 1.07, BNP 130  PMH: 1. Pulmonary hypertension: RHC (5/16) with mean RA 18, PA 83/33 mean 53, mean PCWP 19, CI 2.61, PVR 5.8 WU.   Echo (5/16) with  EF 65-70%, septal flattening, dilated RV with PA systolic pressure 99 mmHg, +bubble study.  PFTs (4/16) with FVC 76%, FEV1 75%, ratio 97%, TLC 66%, DLCO 26% => mild restriction, mild obstruction, severe diffuse abnormality (pulmonary vascular problem).  CTA chest (4/16) with chronic interstitial and obstructive lung disease, small mediastinal and hilar lymph noes, no PE.  Lymph node biopsy negative for sarcoidosis (reactive changes).  V/Q scan (6/16): No acute or chronic PE. TEE (6/16): Normal LV size and systolic function, EF 55-60%,RV was mildly dilated with mildly decreased systolic function, D-shaped interventricular septum suggestive of RV pressure/volume overload, there appeared to be a small PFO present with some bubbles crossing, no ASD.  ANA negative, anti-RNP negative, HIV negative. Sleep study (8/16) without OSA.  She did not tolerate Adcirca.  2. Chronic venous insufficiency. 3. Hyperlipidemia.  4. GERD 5. Chronic hypoxemic respiratory failure: Hypoxemia noted x 6 years at least.    SH: Married, quit smoking in 3/16.  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    FH: No pulmonary hypertension, no premature CAD, no sudden death.   ROS: All systems reviewed and negative except as per HPI.    Current Outpatient Prescriptions  Medication Sig Dispense Refill  . albuterol (PROVENTIL HFA;VENTOLIN HFA) 108 (90 BASE) MCG/ACT inhaler Inhale 1-2 puffs into the lungs every 6 (six) hours as needed for wheezing or shortness of breath. 1 Inhaler 0  . cetirizine (ZYRTEC)  10 MG tablet Take 10 mg by mouth daily.    . citalopram (CELEXA) 20 MG tablet Take 40 mg by mouth every morning.     . famotidine (PEPCID) 20 MG tablet Take 20 mg by mouth daily as needed for heartburn.     . furosemide (LASIX) 40 MG tablet Take 2 tablets (80 mg total) by mouth 2 (two) times daily. 120 tablet 3  . HYDROcodone-acetaminophen (NORCO) 10-325 MG tablet Take 1-2 tablets by mouth every 6 (six) hours as needed for  moderate pain or severe pain. 30 tablet 0  . Macitentan (OPSUMIT) 10 MG TABS Take 1 tablet by mouth daily. 30 tablet   . meloxicam (MOBIC) 7.5 MG tablet Take 7.5 mg by mouth daily.    Marland Kitchen omeprazole (PRILOSEC) 20 MG capsule Take 20 mg by mouth daily as needed (for acid reflux).     . potassium chloride SA (K-DUR,KLOR-CON) 20 MEQ tablet Take 1 tablet (20 mEq total) by mouth daily. 30 tablet 6  . pravastatin (PRAVACHOL) 20 MG tablet Take 20 mg by mouth daily.    . Vitamin D, Ergocalciferol, (DRISDOL) 50000 UNITS CAPS capsule Take 50,000 Units by mouth every Monday.     Marland Kitchen aspirin EC 81 MG tablet Take 1 tablet (81 mg total) by mouth daily. 90 tablet 3   No current facility-administered medications for this encounter.   BP 96/60 mmHg  Pulse 61  Wt 253 lb 12.8 oz (115.123 kg)  SpO2 100%  General: NAD.  Neck: JVP 7-8 cm, no thyromegaly or thyroid nodule.  Lungs:  Rhonchi bilaterally.  CV: Nondisplaced PMI.  Heart regular S1/S2, no S3, no murmur.  Trace ankle edema.  No carotid bruit.  Normal pedal pulses.  Abdomen: Soft, nontender, no hepatosplenomegaly, no distention.  Skin: Intact without lesions or rashes.  Neurologic: Alert and oriented x 3.  Psych: Normal affect. Extremities: No clubbing or cyanosis.  HEENT: Normal.   Assessment/Plan:  1. 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  Underlying lung disease not markedly severe by PFTs.  Restrictive PFTs thought to be due to enlarged heart in chest and obesity.  There was concern from CTA chest for sarcoidosis, but lymph node biopsy was negative.  LFTs normal, ANA negative, anti-RNP negative, HIV negative.  V/Q scan negative for chronic PE.  Small PFO but no ASD on TEE.  No OSA on sleep study.  She did not tolerate Revatio due to joint pain (now resolved), and she did not tolerate Adcirca with worsening dyspnea.   Opsumit helps.  Volume status looks much better and weight is down.  6 minute walk improved.  -  Open lung biopsy deemed too risky, probably not sarcoid.   - Continue Lasix 80 mg bid, BMET/BNP today.     - She had subjective and objective (6 minute walk) improvement with Opsumit but has worsened on Adcirca.  Continue Opsumit.  I am going to work on getting her approved for Selexipag. 2. Abnormal chest CT: Chronic interstitial and obstructive lung disease, mediastinal and hilar lymphadenopathy.  Concern for sarcoidosis.  PFTs showed only mild restriction and obstruction.  Biopsy did not show sarcoid.  Deemed too high risk for open lung biopsy.  Restriction on PFTs may be due to body habitus.  3. Chronic hypoxemic respiratory failure: Continue oxygen up to 5 L with activity, 3L at rest.   Followup in 1 month.   [name]  12/27/2015

## 2015-12-27 NOTE — Patient Instructions (Signed)
Labs today will call if abnormal   Start ASA 81 mg daily  Start Selexapig  Follow up in 6 weeks

## 2016-01-01 ENCOUNTER — Telehealth (HOSPITAL_COMMUNITY): Payer: Self-pay | Admitting: Cardiology

## 2016-01-01 NOTE — Telephone Encounter (Signed)
Patient called to request samples of opsumit- [facility] clinic does not carry samples of this medication, pt aware   PA to be completed by Dinah Beers , pt is unsure which pharmacy she get this from

## 2016-01-02 NOTE — Telephone Encounter (Signed)
Have spoken with [facility], they are sending pt a 7 day emergency supply of Opsumit.  Per [employer] no PA is needed, have called multiple pharmacy's and am unsure where pt needs to get med from, [facility] is looking into this and let me know what to do next.

## 2016-01-04 ENCOUNTER — Telehealth: Payer: Self-pay | Admitting: Cardiology

## 2016-01-04 ENCOUNTER — Other Ambulatory Visit (HOSPITAL_COMMUNITY): Payer: Self-pay | Admitting: *Deleted

## 2016-01-04 DIAGNOSIS — I270 Primary pulmonary hypertension: Secondary | ICD-10-CM

## 2016-01-04 MED ORDER — MACITENTAN 10 MG PO TABS: 1.0000 | ORAL_TABLET | Freq: Every day | ORAL | Status: DC

## 2016-01-04 NOTE — Telephone Encounter (Signed)
Pharmacy calling requesting a refill on Opsumit 10 mg tablet, sent to [facility]. Please advise

## 2016-01-05 MED ORDER — MACITENTAN 10 MG PO TABS
1.0000 | ORAL_TABLET | Freq: Every day | ORAL | Status: DC
Start: 2016-01-05 — End: 2017-02-10

## 2016-01-05 NOTE — Telephone Encounter (Signed)
rx sent to [facility]

## 2016-01-08 NOTE — Telephone Encounter (Signed)
Called to f/u with pt, she states she still has not heard from pharmacy about Opsumit and she has been out for about 5 days now.  I called [facility], they state they jsut need RX from [place], advised we sent RX on 1/27 however they state they did not received, I spoke w/pharmacist and gave VO, she is aware pt is out of med and states they will call pt today, once they speak w/pt they ship out ASAP.  Called pt back to let her know they will be calling her today, if she does not hear from them she will let me know tomorrow.

## 2016-01-11 ENCOUNTER — Telehealth (HOSPITAL_COMMUNITY): Payer: Self-pay | Admitting: *Deleted

## 2016-01-11 MED ORDER — SELEXIPAG 200 MCG PO TABS: 200.0000 ug | ORAL_TABLET | Freq: Two times a day (BID) | ORAL | Status: DC

## 2016-01-11 NOTE — Telephone Encounter (Signed)
ato OV 1/18 Dr Shirlee Latch wanted pt to get started on Selexapeg, have completed PA through [facility] and med is approved 01/10/16-01/09/17

## 2016-02-05 ENCOUNTER — Other Ambulatory Visit (HOSPITAL_COMMUNITY): Payer: Self-pay | Admitting: *Deleted

## 2016-02-05 NOTE — Telephone Encounter (Signed)
Received call from [facility] requesting refill, returned call to them at [phone] gave VO for Opsumit

## 2016-02-07 ENCOUNTER — Encounter: Payer: Self-pay | Admitting: Licensed Clinical Social Worker

## 2016-02-07 ENCOUNTER — Ambulatory Visit (HOSPITAL_COMMUNITY)
Admission: RE | Admit: 2016-02-07 | Discharge: 2016-02-07 | Disposition: A | Discharge status: Home / Self Care | Payer: Self-pay | Source: Ambulatory Visit | Attending: Cardiology | Admitting: Cardiology

## 2016-02-07 VITALS — BP 84/48 | HR 78 | Wt 250.8 lb

## 2016-02-07 DIAGNOSIS — E785 Hyperlipidemia, unspecified: Secondary | ICD-10-CM | POA: Insufficient documentation

## 2016-02-07 DIAGNOSIS — I2721 Secondary pulmonary arterial hypertension: Secondary | ICD-10-CM

## 2016-02-07 DIAGNOSIS — I272 Other secondary pulmonary hypertension: Secondary | ICD-10-CM | POA: Insufficient documentation

## 2016-02-07 DIAGNOSIS — Z9981 Dependence on supplemental oxygen: Secondary | ICD-10-CM | POA: Insufficient documentation

## 2016-02-07 DIAGNOSIS — Z87891 Personal history of nicotine dependence: Secondary | ICD-10-CM | POA: Insufficient documentation

## 2016-02-07 DIAGNOSIS — R918 Other nonspecific abnormal finding of lung field: Secondary | ICD-10-CM | POA: Insufficient documentation

## 2016-02-07 DIAGNOSIS — E669 Obesity, unspecified: Secondary | ICD-10-CM | POA: Insufficient documentation

## 2016-02-07 DIAGNOSIS — K219 Gastro-esophageal reflux disease without esophagitis: Secondary | ICD-10-CM | POA: Insufficient documentation

## 2016-02-07 DIAGNOSIS — I872 Venous insufficiency (chronic) (peripheral): Secondary | ICD-10-CM | POA: Insufficient documentation

## 2016-02-07 DIAGNOSIS — J9611 Chronic respiratory failure with hypoxia: Secondary | ICD-10-CM | POA: Insufficient documentation

## 2016-02-07 DIAGNOSIS — Z7982 Long term (current) use of aspirin: Secondary | ICD-10-CM | POA: Insufficient documentation

## 2016-02-07 DIAGNOSIS — Z79899 Other long term (current) drug therapy: Secondary | ICD-10-CM | POA: Insufficient documentation

## 2016-02-07 LAB — BASIC METABOLIC PANEL
ANION GAP: 11 (ref 5–15)
BUN: 17 mg/dL (ref 6–20)
CALCIUM: 9.1 mg/dL (ref 8.9–10.3)
CHLORIDE: 103 mmol/L (ref 101–111)
CO2: 25 mmol/L (ref 22–32)
Creatinine, Ser: 1.19 mg/dL — ABNORMAL HIGH (ref 0.44–1.00)
GFR calc non Af Amer: 49 mL/min — ABNORMAL LOW (ref >60)
GFR, EST AFRICAN AMERICAN: 57 mL/min — AB (ref >60)
Glucose, Bld: 107 mg/dL — ABNORMAL HIGH (ref 65–99)
POTASSIUM: 4.2 mmol/L (ref 3.5–5.1)
Sodium: 139 mmol/L (ref 135–145)

## 2016-02-07 NOTE — Progress Notes (Signed)
Patient ID: Kelly Donaldson, female   DOB: 08/18/1956, 60 y.o.   MRN: [id number]   PCP: Prudy Feeler Pulmonology: Dr [name] Primary HF: Dr Shirlee Latch   61 yo with history of chronic hypoxemic respiratory failure and pulmonary hypertension.  She has been followed by Dr [name] with pulmonology.  She says that she has been on home oxygen for 6 years.  She quit smoking in 2016.  She had an echo done in 5/16 showing dilated RV with severe pulmonary hypertension.  She therefore had RHC in 5/16 which confirmed severe pulmonary hypertension and RV failure.  PFTs showed only mild obstruction and restriction with markedly decreased DLCO suggestive of pulmonary vascular disease.  There was concern for sarcoidosis on CT chest but lymph node biopsy showed no evidence for sarcoidosis.  V/Q scan showed no evidence for chronic PE.   She has not tolerated Revatio or Adcirca.  At last appointment, I started her on Selexipag at 200 mcg bid.  She increased it to 400 mcg bid but developed pelvic and hip pain.  This improved with decreasing back to 200 mcg bid.  Currently, no dyspnea walking on flat ground and can walk up a flight of steps with mild dyspnea.  No lightheadedness though BP is low today, no chest pain, no syncope. Weight is down 3 lbs.   6 minute walk (5/16): 238 meters => decreased oxygen saturation to 73%, increased to 6 L [place] 6 minute walk (7/16): 170 meters => unable to complete.  6 minute walk (9/16): 201 meters 6 minute walk (1/17): 256 meters  Labs (2/16): BNP 69 Labs (5/16): LFTs normal, HCT 34.9, RF negative, HIV negative, TSH negative Labs (6/16): K 4.4, creatinine 0.88 Labs (7/16): ANA negative, BNP 58, K 4.3, creatinine 1.1, anti-RNP negative Labs (9/16): K 4.4, creatinine 1.26, BNP 91 Labs (10/16): K 4.3, creatinine 1.04 Labs (12/16): K 4.2, creatinine 1.07, BNP 130 Labs (1/17): K 4.3, creatinine 1.23, BNP 42  PMH: 1. Pulmonary hypertension: RHC (5/16) with mean RA 18, PA 83/33 mean 53, mean PCWP  19, CI 2.61, PVR 5.8 WU.   Echo (5/16) with EF 65-70%, septal flattening, dilated RV with PA systolic pressure 99 mmHg, +bubble study.  PFTs (4/16) with FVC 76%, FEV1 75%, ratio 97%, TLC 66%, DLCO 26% => mild restriction, mild obstruction, severe diffuse abnormality (pulmonary vascular problem).  CTA chest (4/16) with chronic interstitial and obstructive lung disease, small mediastinal and hilar lymph noes, no PE.  Lymph node biopsy negative for sarcoidosis (reactive changes).  V/Q scan (6/16): No acute or chronic PE. TEE (6/16): Normal LV size and systolic function, EF 55-60%,RV was mildly dilated with mildly decreased systolic function, D-shaped interventricular septum suggestive of RV pressure/volume overload, there appeared to be a small PFO present with some bubbles crossing, no ASD.  ANA negative, anti-RNP negative, HIV negative. Sleep study (8/16) without OSA.  She did not tolerate Adcirca.  2. Chronic venous insufficiency. 3. Hyperlipidemia.  4. GERD 5. Chronic hypoxemic respiratory failure: Hypoxemia noted x 6 years at least.    SH: Married, quit smoking in 3/16.  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    FH: No pulmonary hypertension, no premature CAD, no sudden death.   ROS: All systems reviewed and negative except as per HPI.    Current Outpatient Prescriptions  Medication Sig Dispense Refill  . albuterol (PROVENTIL HFA;VENTOLIN HFA) 108 (90 BASE) MCG/ACT inhaler Inhale 1-2 puffs into the lungs every 6 (six) hours as needed for wheezing or shortness  of breath. 1 Inhaler 0  . aspirin EC 81 MG tablet Take 1 tablet (81 mg total) by mouth daily. 90 tablet 3  . cetirizine (ZYRTEC) 10 MG tablet Take 10 mg by mouth daily.    . citalopram (CELEXA) 20 MG tablet Take 40 mg by mouth every morning.     . famotidine (PEPCID) 20 MG tablet Take 20 mg by mouth daily as needed for heartburn.     . furosemide (LASIX) 40 MG tablet Take 120 mg by mouth daily.    Marland Kitchen HYDROcodone-acetaminophen  (NORCO) 10-325 MG tablet Take 1-2 tablets by mouth every 6 (six) hours as needed for moderate pain or severe pain. 30 tablet 0  . Macitentan (OPSUMIT) 10 MG TABS Take 1 tablet by mouth daily. 90 tablet 3  . meloxicam (MOBIC) 7.5 MG tablet Take 7.5 mg by mouth daily.    Marland Kitchen omeprazole (PRILOSEC) 20 MG capsule Take 20 mg by mouth daily as needed (for acid reflux).     . potassium chloride SA (K-DUR,KLOR-CON) 20 MEQ tablet Take 1 tablet (20 mEq total) by mouth daily. 30 tablet 6  . pravastatin (PRAVACHOL) 20 MG tablet Take 20 mg by mouth daily.    . Selexipag (UPTRAVI) 200 MCG TABS Take 1 tablet (200 mcg total) by mouth 2 (two) times daily. 60 tablet   . Vitamin D, Ergocalciferol, (DRISDOL) 50000 UNITS CAPS capsule Take 50,000 Units by mouth every Monday.      No current facility-administered medications for this encounter.   BP 84/48 mmHg  Pulse 78  Wt 250 lb 12 oz (113.739 kg)  SpO2 92%  General: NAD.  Neck: JVP 7 cm, no thyromegaly or thyroid nodule.  Lungs:  Rhonchi bilaterally.  CV: Nondisplaced PMI.  Heart regular S1/S2, no S3, 1/6 HSM LLSB.  Trace ankle edema.  No carotid bruit.  Normal pedal pulses.  Abdomen: Soft, nontender, no hepatosplenomegaly, no distention.  Skin: Intact without lesions or rashes.  Neurologic: Alert and oriented x 3.  Psych: Normal affect. Extremities: No clubbing or cyanosis.  HEENT: Normal.   Assessment/Plan:  1. 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  Underlying lung disease not markedly severe by PFTs.  Restrictive PFTs thought to be due to enlarged heart in chest and obesity.  There was concern from CTA chest for sarcoidosis, but lymph node biopsy was negative.  LFTs normal, ANA negative, anti-RNP negative, HIV negative.  V/Q scan negative for chronic PE.  Small PFO but no ASD on TEE.  No OSA on sleep study.  She did not tolerate Revatio due to joint pain (now resolved), and she did not tolerate Adcirca with worsening  dyspnea.   Opsumit helps.  Volume status looks ok and weight is down.   - Did not bring shoes for 6 minute walk today, will do at next appointment. - Open lung biopsy deemed too risky, probably not sarcoid.   - Continue Lasix 120 mg daily, BMET/BNP today.     - Pelvic/hip pain unusual with selexipag.  I will have her try to increase it back to 400 mcg bid.  If symptoms do not return, she can continue to titrate the medication upwards.  - Repeat echo 6/17.  2. Abnormal chest CT: Chronic interstitial and obstructive lung disease, mediastinal and hilar lymphadenopathy.  Concern for sarcoidosis.  PFTs showed only mild restriction and obstruction.  Biopsy did not show sarcoid.  Deemed too high risk for open lung biopsy.  Restriction on PFTs  may be due to body habitus.  3. Chronic hypoxemic respiratory failure: Continue oxygen up to 5 L with activity, 3L at rest.   Followup in 1 month.   [name]  02/07/2016

## 2016-02-07 NOTE — Progress Notes (Signed)
Patient ID: Kelly Donaldson, female   DOB: 02-Jun-1956, 60 y.o.   MRN: [id number] CSW met with patient and husband in the clinic. CSW referred to assist with insurance options as patient states she can not afford the $500 premium for a plan through [facility]. Patient states she is 60yo and has been receiving SSD since late spring 2016. Patient will become eligible for Medicare in 2018. CSW discussed possible medicaid eligibilty as she has multiple unpaid medical bills and receiving SSD. CSW encouraged patient to go to [employer] in [place]. And make application for medicaid. Patient verbalizes understanding and will return call to CSW if further needs arise. Raquel Sarna, [place]

## 2016-02-07 NOTE — Patient Instructions (Signed)
Labs today  Your physician recommends that you schedule a follow-up appointment in: 1 month  

## 2016-03-14 ENCOUNTER — Ambulatory Visit (HOSPITAL_COMMUNITY)
Admission: RE | Admit: 2016-03-14 | Discharge: 2016-03-14 | Disposition: A | Discharge status: Home / Self Care | Payer: Self-pay | Source: Ambulatory Visit | Attending: Cardiology | Admitting: Cardiology

## 2016-03-14 VITALS — BP 94/52 | HR 61 | Wt 256.5 lb

## 2016-03-14 DIAGNOSIS — J9611 Chronic respiratory failure with hypoxia: Secondary | ICD-10-CM | POA: Insufficient documentation

## 2016-03-14 DIAGNOSIS — Z87891 Personal history of nicotine dependence: Secondary | ICD-10-CM | POA: Insufficient documentation

## 2016-03-14 DIAGNOSIS — I509 Heart failure, unspecified: Secondary | ICD-10-CM | POA: Insufficient documentation

## 2016-03-14 DIAGNOSIS — Z79899 Other long term (current) drug therapy: Secondary | ICD-10-CM | POA: Insufficient documentation

## 2016-03-14 DIAGNOSIS — Z9981 Dependence on supplemental oxygen: Secondary | ICD-10-CM | POA: Insufficient documentation

## 2016-03-14 DIAGNOSIS — K219 Gastro-esophageal reflux disease without esophagitis: Secondary | ICD-10-CM | POA: Insufficient documentation

## 2016-03-14 DIAGNOSIS — E785 Hyperlipidemia, unspecified: Secondary | ICD-10-CM | POA: Insufficient documentation

## 2016-03-14 DIAGNOSIS — I272 Other secondary pulmonary hypertension: Secondary | ICD-10-CM | POA: Insufficient documentation

## 2016-03-14 DIAGNOSIS — R590 Localized enlarged lymph nodes: Secondary | ICD-10-CM | POA: Insufficient documentation

## 2016-03-14 DIAGNOSIS — I872 Venous insufficiency (chronic) (peripheral): Secondary | ICD-10-CM | POA: Insufficient documentation

## 2016-03-14 DIAGNOSIS — I2721 Secondary pulmonary arterial hypertension: Secondary | ICD-10-CM

## 2016-03-14 DIAGNOSIS — J449 Chronic obstructive pulmonary disease, unspecified: Secondary | ICD-10-CM | POA: Insufficient documentation

## 2016-03-14 DIAGNOSIS — Z7982 Long term (current) use of aspirin: Secondary | ICD-10-CM | POA: Insufficient documentation

## 2016-03-14 MED ORDER — POTASSIUM CHLORIDE CRYS ER 20 MEQ PO TBCR: EXTENDED_RELEASE_TABLET | ORAL | Status: DC

## 2016-03-14 MED ORDER — FUROSEMIDE 40 MG PO TABS: ORAL_TABLET | ORAL | Status: DC

## 2016-03-14 MED ORDER — SELEXIPAG 200 MCG PO TABS: 200.0000 ug | ORAL_TABLET | Freq: Two times a day (BID) | ORAL | Status: DC

## 2016-03-14 NOTE — Progress Notes (Signed)
Patient ID: [name], female   DOB: 09-29-1956, 60 y.o.   MRN: [id number]   PCP: Prudy Feeler Pulmonology: Dr [name] Primary HF: Dr Shirlee Latch   60 yo with history of chronic hypoxemic respiratory failure and pulmonary hypertension.  She has been followed by Dr [name] with pulmonology.  She says that she has been on home oxygen for 6 years.  She quit smoking in 2016.  She had an echo done in 5/16 showing dilated RV with severe pulmonary hypertension.  She therefore had RHC in 5/16 which confirmed severe pulmonary hypertension and RV failure.  PFTs showed only mild obstruction and restriction with markedly decreased DLCO suggestive of pulmonary vascular disease.  There was concern for sarcoidosis on CT chest but lymph node biopsy showed no evidence for sarcoidosis.  V/Q scan showed no evidence for chronic PE.   She has not tolerated Revatio or Adcirca.  At a prior appointment, I started her on Selexipag.  This was titrated up to 800 mcg bid.  She developed pelvic pain that seems to be related to Selexipag and stopped it, with resolution of pelvic pain.  Of note, she was able to tolerate 200 mcg bid of Selexipag without pain.  BP is running lower, occasional lightheadedness with standing.  She was able to walk into the office today with mild dyspnea.  Dyspnea with steps/inclines.  No chest pain, no palpitations, no orthopnea/PND.  Weight is up 6 lbs.   6 minute walk (5/16): 238 meters => decreased oxygen saturation to 73%, increased to 6 L [place] 6 minute walk (7/16): 170 meters => unable to complete.  6 minute walk (9/16): 201 meters 6 minute walk (1/17): 256 meters 6 minute walk (4/17): 219 meters  Labs (2/16): BNP 69 Labs (5/16): LFTs normal, HCT 34.9, RF negative, HIV negative, TSH negative Labs (6/16): K 4.4, creatinine 0.88 Labs (7/16): ANA negative, BNP 58, K 4.3, creatinine 1.1, anti-RNP negative Labs (9/16): K 4.4, creatinine 1.26, BNP 91 Labs (10/16): K 4.3, creatinine 1.04 Labs (12/16): K 4.2,  creatinine 1.07, BNP 130 Labs (1/17): K 4.3, creatinine 1.23, BNP 42 Labs (3/17): K 4.2, creatinine 1.19  PMH: 1. Pulmonary hypertension: RHC (5/16) with mean RA 18, PA 83/33 mean 53, mean PCWP 19, CI 2.61, PVR 5.8 WU.   Echo (5/16) with EF 65-70%, septal flattening, dilated RV with PA systolic pressure 99 mmHg, +bubble study.  PFTs (4/16) with FVC 76%, FEV1 75%, ratio 97%, TLC 66%, DLCO 26% => mild restriction, mild obstruction, severe diffuse abnormality (pulmonary vascular problem).  CTA chest (4/16) with chronic interstitial and obstructive lung disease, small mediastinal and hilar lymph noes, no PE.  Lymph node biopsy negative for sarcoidosis (reactive changes).  V/Q scan (6/16): No acute or chronic PE. TEE (6/16): Normal LV size and systolic function, EF 55-60%,RV was mildly dilated with mildly decreased systolic function, D-shaped interventricular septum suggestive of RV pressure/volume overload, there appeared to be a small PFO present with some bubbles crossing, no ASD.  ANA negative, anti-RNP negative, HIV negative. Sleep study (8/16) without OSA.  She did not tolerate Adcirca or Revatio.  She cannot tolerate more than 200 mcg bid Selexipag.  2. Chronic venous insufficiency. 3. Hyperlipidemia.  4. GERD 5. Chronic hypoxemic respiratory failure: Hypoxemia noted x 6 years at least.    SH: Married, quit smoking in 3/16.  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    FH: No pulmonary hypertension, no premature CAD, no sudden death.   ROS: All systems reviewed  and negative except as per HPI.    Current Outpatient Prescriptions  Medication Sig Dispense Refill  . albuterol (PROVENTIL HFA;VENTOLIN HFA) 108 (90 BASE) MCG/ACT inhaler Inhale 1-2 puffs into the lungs every 6 (six) hours as needed for wheezing or shortness of breath. 1 Inhaler 0  . aspirin EC 81 MG tablet Take 1 tablet (81 mg total) by mouth daily. 90 tablet 3  . cetirizine (ZYRTEC) 10 MG tablet Take 10 mg by mouth daily.     . citalopram (CELEXA) 20 MG tablet Take 40 mg by mouth every morning.     . famotidine (PEPCID) 20 MG tablet Take 20 mg by mouth daily as needed for heartburn.     . furosemide (LASIX) 40 MG tablet Take 3 tabs daily every other day ALTERNATING with 3 tabs in AM and 2 tabs in PM every other day 150 tablet 3  . HYDROcodone-acetaminophen (NORCO) 10-325 MG tablet Take 1-2 tablets by mouth every 6 (six) hours as needed for moderate pain or severe pain. 30 tablet 0  . Macitentan (OPSUMIT) 10 MG TABS Take 1 tablet by mouth daily. 90 tablet 3  . meloxicam (MOBIC) 7.5 MG tablet Take 7.5 mg by mouth daily.    Marland Kitchen omeprazole (PRILOSEC) 20 MG capsule Take 20 mg by mouth daily as needed (for acid reflux).     . potassium chloride SA (K-DUR,KLOR-CON) 20 MEQ tablet Take 1 tab daily every other day ALTERNATING with 1 tab Twice daily every other day 60 tablet 6  . pravastatin (PRAVACHOL) 20 MG tablet Take 20 mg by mouth daily.    . Vitamin D, Ergocalciferol, (DRISDOL) 50000 UNITS CAPS capsule Take 50,000 Units by mouth every Monday.     . Selexipag 200 MCG TABS Take 1 tablet (200 mcg total) by mouth 2 (two) times daily. 60 tablet    No current facility-administered medications for this encounter.   BP 94/52 mmHg  Pulse 61  Wt 256 lb 8 oz (116.348 kg)  SpO2 97%  General: NAD.  Neck: JVP 8-9 cm, no thyromegaly or thyroid nodule.  Lungs:  Rhonchi bilaterally.  CV: Nondisplaced PMI.  Heart regular S1/S2, no S3, 2/6 HSM LLSB.  1+ ankle edema.  No carotid bruit.  Normal pedal pulses.  Abdomen: Soft, nontender, no hepatosplenomegaly, no distention.  Skin: Intact without lesions or rashes.  Neurologic: Alert and oriented x 3.  Psych: Normal affect. Extremities: No clubbing or cyanosis.  HEENT: Normal.   Assessment/Plan:  1. 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  Underlying lung disease not markedly severe by PFTs.  Restrictive PFTs thought to be due to enlarged heart  in chest and obesity.  There was concern from CTA chest for sarcoidosis, but lymph node biopsy was negative.  LFTs normal, ANA negative, anti-RNP negative, HIV negative.  V/Q scan negative for chronic PE.  Small PFO but no ASD on TEE.  No OSA on sleep study.  She did not tolerate Revatio due to joint pain (now resolved), and she did not tolerate Adcirca with worsening dyspnea.  She does not tolerate more than 200 mcg bid Selexipag due to pelvic pain.  Opsumit helps.  She looks mildly volume overloaded on exam and 6 minute walk is down a bit. NYHA class III. - Open lung biopsy deemed too risky, probably not sarcoid.   - Increase Lasix to 120 mg qam/80 qpm alternating with 120 mg daily.  Increase KCl to 20 mEq bid alternating with 20 mEq  daily.  BMET in 10 days.    - Continue Opsumit. - Restart Selexipag at 200 mcg bid and do not titrate up.  She only seems to tolerate 200 mcg bid.   - Given ongoing significant symptoms, I am going to try to get her on riociguat.   - Repeat echo 5/17.  2. Abnormal chest CT: Chronic interstitial and obstructive lung disease, mediastinal and hilar lymphadenopathy.  Concern for sarcoidosis.  PFTs showed only mild restriction and obstruction.  Biopsy did not show sarcoid.  Deemed too high risk for open lung biopsy.  Restriction on PFTs may be due to body habitus.  3. Chronic hypoxemic respiratory failure: Continue oxygen up to 5 L with activity, 3L at rest.   Followup in 1 month.   [name]  03/14/2016

## 2016-03-14 NOTE — Patient Instructions (Signed)
Increase Furosemide to 120 mg (3 tabs) EVERY AM and 80 mg (2 tabs) every other day in the PM  Increase Potassium to 20 meq (1 tab) EVERY AM and take 20 meq in PM when you take Lasix in the PM  Restart Selexipeg 200 mcg Twice daily   We will submit the paperwork for a new medication: Adempas 0.5 mg Three times a day   Please get labs drawn in about 10 days  Your physician recommends that you schedule a follow-up appointment in: 1 month

## 2016-03-14 NOTE — Progress Notes (Signed)
6 MW completed, pt ambulated 720 ft (219.5 m).  Starting O2 sats was 945 on 4L however 100 ft in sats dropped to 78% and O2 was increased to 6L, the rest of the walk sats ranged 84-89%

## 2016-04-17 ENCOUNTER — Encounter (HOSPITAL_COMMUNITY): Payer: Self-pay

## 2016-04-17 ENCOUNTER — Ambulatory Visit (HOSPITAL_COMMUNITY)
Admission: RE | Admit: 2016-04-17 | Discharge: 2016-04-17 | Disposition: A | Discharge status: Home / Self Care | Payer: Self-pay | Source: Ambulatory Visit | Attending: Cardiology | Admitting: Cardiology

## 2016-04-17 VITALS — BP 106/56 | HR 80 | Wt 252.2 lb

## 2016-04-17 DIAGNOSIS — I272 Other secondary pulmonary hypertension: Secondary | ICD-10-CM | POA: Insufficient documentation

## 2016-04-17 DIAGNOSIS — Z7982 Long term (current) use of aspirin: Secondary | ICD-10-CM | POA: Insufficient documentation

## 2016-04-17 DIAGNOSIS — I872 Venous insufficiency (chronic) (peripheral): Secondary | ICD-10-CM | POA: Insufficient documentation

## 2016-04-17 DIAGNOSIS — I5032 Chronic diastolic (congestive) heart failure: Secondary | ICD-10-CM

## 2016-04-17 DIAGNOSIS — K219 Gastro-esophageal reflux disease without esophagitis: Secondary | ICD-10-CM | POA: Insufficient documentation

## 2016-04-17 DIAGNOSIS — J449 Chronic obstructive pulmonary disease, unspecified: Secondary | ICD-10-CM | POA: Insufficient documentation

## 2016-04-17 DIAGNOSIS — I2721 Secondary pulmonary arterial hypertension: Secondary | ICD-10-CM

## 2016-04-17 DIAGNOSIS — Z87891 Personal history of nicotine dependence: Secondary | ICD-10-CM | POA: Insufficient documentation

## 2016-04-17 DIAGNOSIS — Z9981 Dependence on supplemental oxygen: Secondary | ICD-10-CM | POA: Insufficient documentation

## 2016-04-17 DIAGNOSIS — Z79899 Other long term (current) drug therapy: Secondary | ICD-10-CM | POA: Insufficient documentation

## 2016-04-17 DIAGNOSIS — R590 Localized enlarged lymph nodes: Secondary | ICD-10-CM | POA: Insufficient documentation

## 2016-04-17 DIAGNOSIS — E785 Hyperlipidemia, unspecified: Secondary | ICD-10-CM | POA: Insufficient documentation

## 2016-04-17 DIAGNOSIS — J9611 Chronic respiratory failure with hypoxia: Secondary | ICD-10-CM | POA: Insufficient documentation

## 2016-04-17 LAB — BASIC METABOLIC PANEL
ANION GAP: 11 (ref 5–15)
BUN: 22 mg/dL — ABNORMAL HIGH (ref 6–20)
CHLORIDE: 103 mmol/L (ref 101–111)
CO2: 26 mmol/L (ref 22–32)
Calcium: 8.9 mg/dL (ref 8.9–10.3)
Creatinine, Ser: 1.35 mg/dL — ABNORMAL HIGH (ref 0.44–1.00)
GFR calc Af Amer: 48 mL/min — ABNORMAL LOW (ref >60)
GFR, EST NON AFRICAN AMERICAN: 42 mL/min — AB (ref >60)
GLUCOSE: 106 mg/dL — AB (ref 65–99)
POTASSIUM: 4.3 mmol/L (ref 3.5–5.1)
SODIUM: 140 mmol/L (ref 135–145)

## 2016-04-17 LAB — BRAIN NATRIURETIC PEPTIDE: B NATRIURETIC PEPTIDE 5: 44.2 pg/mL (ref 0.0–100.0)

## 2016-04-17 MED ORDER — RIOCIGUAT 0.5 MG PO TABS: 0.5000 mg | ORAL_TABLET | Freq: Two times a day (BID) | ORAL | Status: DC

## 2016-04-17 NOTE — Patient Instructions (Signed)
Please have your primary care provider refer you to an Eye doctor.  Routine lab work today. Will notify you of abnormal results  Follow up and Echo with Dr.McLean in 6 weeks

## 2016-04-18 DIAGNOSIS — I5032 Chronic diastolic (congestive) heart failure: Secondary | ICD-10-CM | POA: Insufficient documentation

## 2016-04-18 NOTE — Progress Notes (Signed)
Patient ID: Kelly Donaldson, female   DOB: 1956/06/24, 60 y.o.   MRN: [id number]   PCP: Prudy Feeler Pulmonology: Dr [name] Primary HF: Dr Shirlee Latch   60 yo with history of chronic hypoxemic respiratory failure and pulmonary hypertension.  She has been followed by Dr [name] with pulmonology.  She says that she has been on home oxygen for 6 years.  She quit smoking in 2016.  She had an echo done in 5/16 showing dilated RV with severe pulmonary hypertension.  She therefore had RHC in 5/16 which confirmed severe pulmonary hypertension and RV failure.  PFTs showed only mild obstruction and restriction with markedly decreased DLCO suggestive of pulmonary vascular disease.  There was concern for sarcoidosis on CT chest but lymph node biopsy showed no evidence for sarcoidosis.  V/Q scan showed no evidence for chronic PE.   She has not tolerated Revatio or Adcirca.  At a prior appointment, I started her on Selexipag.  This was titrated up to 800 mcg bid.  She developed pelvic pain that seems to be related to Selexipag and stopped it, with resolution of pelvic pain.  She is now on Selexipag 200 mcg bid which she tolerates.  SBP still drops into the 90s at times, sometimes she is lightheaded with standing.  She is short of breath after walking about 1/2 block.  She does fine walking around the house. Dyspnea with steps/inclines.  No chest pain, no palpitations, no orthopnea/PND.  Weight down 4 lbs.  She is using 3L oxygen by nasal cannula.   Her right eye drifts towards the right the longer she looks at something.  This has been a problem for about 8 months but is worsening.    6 minute walk (5/16): 238 meters => decreased oxygen saturation to 73%, increased to 6 L [place] 6 minute walk (7/16): 170 meters => unable to complete.  6 minute walk (9/16): 201 meters 6 minute walk (1/17): 256 meters 6 minute walk (4/17): 219 meters  ECG: NSR, normal  Labs (2/16): BNP 69 Labs (5/16): LFTs normal, HCT 34.9, RF negative, HIV  negative, TSH negative Labs (6/16): K 4.4, creatinine 0.88 Labs (7/16): ANA negative, BNP 58, K 4.3, creatinine 1.1, anti-RNP negative Labs (9/16): K 4.4, creatinine 1.26, BNP 91 Labs (10/16): K 4.3, creatinine 1.04 Labs (12/16): K 4.2, creatinine 1.07, BNP 130 Labs (1/17): K 4.3, creatinine 1.23, BNP 42 Labs (3/17): K 4.2, creatinine 1.19  PMH: 1. Pulmonary hypertension: RHC (5/16) with mean RA 18, PA 83/33 mean 53, mean PCWP 19, CI 2.61, PVR 5.8 WU.   Echo (5/16) with EF 65-70%, septal flattening, dilated RV with PA systolic pressure 99 mmHg, +bubble study.  PFTs (4/16) with FVC 76%, FEV1 75%, ratio 97%, TLC 66%, DLCO 26% => mild restriction, mild obstruction, severe diffuse abnormality (pulmonary vascular problem).  CTA chest (4/16) with chronic interstitial and obstructive lung disease, small mediastinal and hilar lymph noes, no PE.  Lymph node biopsy negative for sarcoidosis (reactive changes).  V/Q scan (6/16): No acute or chronic PE. TEE (6/16): Normal LV size and systolic function, EF 55-60%,RV was mildly dilated with mildly decreased systolic function, D-shaped interventricular septum suggestive of RV pressure/volume overload, there appeared to be a small PFO present with some bubbles crossing, no ASD.  ANA negative, anti-RNP negative, HIV negative. Sleep study (8/16) without OSA.  She did not tolerate Adcirca or Revatio.  She cannot tolerate more than 200 mcg bid Selexipag.  2. Chronic venous insufficiency. 3. Hyperlipidemia.  4.  GERD 5. Chronic hypoxemic respiratory failure: Hypoxemia noted x 6 years at least.    SH: Married, quit smoking in 3/16.  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    FH: No pulmonary hypertension, no premature CAD, no sudden death.   ROS: All systems reviewed and negative except as per HPI.    Current Outpatient Prescriptions  Medication Sig Dispense Refill  . albuterol (PROVENTIL HFA;VENTOLIN HFA) 108 (90 BASE) MCG/ACT inhaler Inhale 1-2 puffs  into the lungs every 6 (six) hours as needed for wheezing or shortness of breath. 1 Inhaler 0  . aspirin EC 81 MG tablet Take 1 tablet (81 mg total) by mouth daily. 90 tablet 3  . cetirizine (ZYRTEC) 10 MG tablet Take 10 mg by mouth daily.    . citalopram (CELEXA) 20 MG tablet Take 40 mg by mouth every morning.     . famotidine (PEPCID) 20 MG tablet Take 20 mg by mouth daily as needed for heartburn.     . furosemide (LASIX) 40 MG tablet Take 3 tabs daily every other day ALTERNATING with 3 tabs in AM and 2 tabs in PM every other day 150 tablet 3  . HYDROcodone-acetaminophen (NORCO) 10-325 MG tablet Take 1-2 tablets by mouth every 6 (six) hours as needed for moderate pain or severe pain. 30 tablet 0  . Macitentan (OPSUMIT) 10 MG TABS Take 1 tablet by mouth daily. 90 tablet 3  . meloxicam (MOBIC) 7.5 MG tablet Take 7.5 mg by mouth daily.    Marland Kitchen omeprazole (PRILOSEC) 20 MG capsule Take 20 mg by mouth daily as needed (for acid reflux).     . potassium chloride SA (K-DUR,KLOR-CON) 20 MEQ tablet Take 1 tab daily every other day ALTERNATING with 1 tab Twice daily every other day 60 tablet 6  . pravastatin (PRAVACHOL) 20 MG tablet Take 20 mg by mouth daily.    . Selexipag 200 MCG TABS Take 1 tablet (200 mcg total) by mouth 2 (two) times daily. 60 tablet   . Vitamin D, Ergocalciferol, (DRISDOL) 50000 UNITS CAPS capsule Take 50,000 Units by mouth every Monday.     . Riociguat (ADEMPAS) 0.5 MG TABS Take 0.5 mg by mouth 2 (two) times daily. 30 tablet 11   No current facility-administered medications for this encounter.   BP 106/56 mmHg  Pulse 80  Wt 252 lb 4 oz (114.42 kg)  SpO2 97%  General: NAD.  Neck: JVP 7 cm, no thyromegaly or thyroid nodule.  Lungs:  CTAB  CV: Nondisplaced PMI.  Heart regular S1/S2, no S3, 2/6 HSM LLSB.  No edema.  No carotid bruit.  Normal pedal pulses.  Abdomen: Soft, nontender, no hepatosplenomegaly, no distention.  Skin: Intact without lesions or rashes.  Neurologic: Alert  and oriented x 3.  Psych: Normal affect. Extremities: No clubbing or cyanosis.  HEENT: Normal.   Assessment/Plan:  1. 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  Underlying lung disease not markedly severe by PFTs.  Restrictive PFTs thought to be due to enlarged heart in chest and obesity.  There was concern from CTA chest for sarcoidosis, but lymph node biopsy was negative.  LFTs normal, ANA negative, anti-RNP negative, HIV negative.  V/Q scan negative for chronic PE.  Small PFO but no ASD on TEE.  No OSA on sleep study.  She did not tolerate Revatio due to joint pain (now resolved), and she did not tolerate Adcirca with worsening dyspnea.  She does not tolerate more than  200 mcg bid Selexipag due to pelvic pain.  She looks euvolemic today on increased Lasix.   - Open lung biopsy deemed too risky, probably not sarcoid.   - Continue current Lasix, BMET/BNP today.    - Continue Opsumit. - Continue Selexipag at 200 mcg bid and do not titrate up.  She only seems to tolerate 200 mcg bid.   - Given ongoing significant symptoms, I am going to try to get her on riociguat => we filled out the paperwork for this today.   - Arrange for repeat echo.  - Will need 6 minute walk next appt.  2. Abnormal chest CT: Chronic interstitial and obstructive lung disease, mediastinal and hilar lymphadenopathy.  Concern for sarcoidosis.  PFTs showed only mild restriction and obstruction.  Biopsy did not show sarcoid.  Deemed too high risk for open lung biopsy.  Restriction on PFTs may be due to body habitus.  3. Chronic hypoxemic respiratory failure: Continue oxygen up to 5 L with activity, 3L at rest.   Followup in 6 wks.   [name]  04/18/2016

## 2016-05-29 ENCOUNTER — Telehealth (HOSPITAL_COMMUNITY): Payer: Self-pay | Admitting: *Deleted

## 2016-05-29 DIAGNOSIS — I270 Primary pulmonary hypertension: Secondary | ICD-10-CM

## 2016-05-29 MED ORDER — SELEXIPAG 200 MCG PO TABS: 200.0000 ug | ORAL_TABLET | Freq: Two times a day (BID) | ORAL | Status: DC

## 2016-05-29 NOTE — Telephone Encounter (Signed)
Called pt to f/u on Adempas, pt states she received the med and the nurse was out today and she started 0.5 mg mg TID today.  She states she is not able to tolerate the Uptravi 400 mcg and needs rx for 200 mcg as that is the only dose she can tolerate, new rx sent in.

## 2016-06-03 ENCOUNTER — Ambulatory Visit (HOSPITAL_BASED_OUTPATIENT_CLINIC_OR_DEPARTMENT_OTHER)
Admission: RE | Admit: 2016-06-03 | Discharge: 2016-06-03 | Disposition: A | Discharge status: Home / Self Care | Payer: Self-pay | Source: Ambulatory Visit | Attending: Cardiology | Admitting: Cardiology

## 2016-06-03 ENCOUNTER — Ambulatory Visit (HOSPITAL_COMMUNITY)
Admission: RE | Admit: 2016-06-03 | Discharge: 2016-06-03 | Disposition: A | Discharge status: Home / Self Care | Payer: Self-pay | Source: Ambulatory Visit | Attending: *Deleted | Admitting: *Deleted

## 2016-06-03 ENCOUNTER — Encounter (HOSPITAL_COMMUNITY): Payer: Self-pay

## 2016-06-03 VITALS — BP 110/64 | HR 64 | Ht 69.0 in | Wt 256.0 lb

## 2016-06-03 DIAGNOSIS — K219 Gastro-esophageal reflux disease without esophagitis: Secondary | ICD-10-CM | POA: Insufficient documentation

## 2016-06-03 DIAGNOSIS — I2721 Secondary pulmonary arterial hypertension: Secondary | ICD-10-CM

## 2016-06-03 DIAGNOSIS — J9611 Chronic respiratory failure with hypoxia: Secondary | ICD-10-CM

## 2016-06-03 DIAGNOSIS — I872 Venous insufficiency (chronic) (peripheral): Secondary | ICD-10-CM | POA: Insufficient documentation

## 2016-06-03 DIAGNOSIS — I272 Other secondary pulmonary hypertension: Secondary | ICD-10-CM

## 2016-06-03 DIAGNOSIS — I5032 Chronic diastolic (congestive) heart failure: Secondary | ICD-10-CM

## 2016-06-03 DIAGNOSIS — I110 Hypertensive heart disease with heart failure: Secondary | ICD-10-CM | POA: Insufficient documentation

## 2016-06-03 DIAGNOSIS — Z7982 Long term (current) use of aspirin: Secondary | ICD-10-CM | POA: Insufficient documentation

## 2016-06-03 DIAGNOSIS — Z79899 Other long term (current) drug therapy: Secondary | ICD-10-CM | POA: Insufficient documentation

## 2016-06-03 DIAGNOSIS — I509 Heart failure, unspecified: Secondary | ICD-10-CM | POA: Insufficient documentation

## 2016-06-03 DIAGNOSIS — E785 Hyperlipidemia, unspecified: Secondary | ICD-10-CM | POA: Insufficient documentation

## 2016-06-03 LAB — ECHOCARDIOGRAM COMPLETE
AV Peak grad: 21 mmHg
AV peak Index: 1.08
AVAREAVTI: 2.47 cm2
AVPKVEL: 231 cm/s
Ao pk vel: 0.87 m/s
CHL CUP MV DEC (S): 331
CHL CUP STROKE VOLUME: 75 mL
CHL CUP TV REG PEAK VELOCITY: 300 cm/s
E decel time: 331 msec
EERAT: 6.45
FS: 32 % (ref 28–44)
IVS/LV PW RATIO, ED: 1.02
LA ID, A-P, ES: 43 mm
LA diam index: 1.89 cm/m2
LA vol A4C: 91.9 ml
LA vol index: 41.4 mL/m2
LA vol: 94.5 mL
LDCA: 2.84 cm2
LEFT ATRIUM END SYS DIAM: 43 mm
LV E/e'average: 6.45
LV SIMPSON'S DISK: 62
LV TDI E'LATERAL: 16.6
LV dias vol index: 53 mL/m2
LV dias vol: 121 mL — AB (ref 46–106)
LV e' LATERAL: 16.6 cm/s
LVEEMED: 6.45
LVOT diameter: 19 mm
LVOT peak grad rest: 16 mmHg
LVOT peak vel: 201 cm/s
LVSYSVOL: 46 mL — AB (ref 14–42)
LVSYSVOLIN: 20 mL/m2
MV Peak grad: 5 mmHg
MVPKAVEL: 106 m/s
MVPKEVEL: 107 m/s
PW: 8.87 mm — AB (ref 0.6–1.1)
RV TAPSE: 27.5 mm
TDI e' medial: 12.1
TRMAXVEL: 300 cm/s

## 2016-06-03 LAB — BASIC METABOLIC PANEL
Anion gap: 8 (ref 5–15)
BUN: 21 mg/dL — AB (ref 6–20)
CHLORIDE: 101 mmol/L (ref 101–111)
CO2: 28 mmol/L (ref 22–32)
CREATININE: 1.18 mg/dL — AB (ref 0.44–1.00)
Calcium: 9 mg/dL (ref 8.9–10.3)
GFR, EST AFRICAN AMERICAN: 57 mL/min — AB (ref >60)
GFR, EST NON AFRICAN AMERICAN: 49 mL/min — AB (ref >60)
Glucose, Bld: 118 mg/dL — ABNORMAL HIGH (ref 65–99)
POTASSIUM: 4.4 mmol/L (ref 3.5–5.1)
SODIUM: 137 mmol/L (ref 135–145)

## 2016-06-03 MED ORDER — SELEXIPAG 200 MCG PO TABS: 200.0000 ug | ORAL_TABLET | Freq: Two times a day (BID) | ORAL | Status: DC

## 2016-06-03 NOTE — Progress Notes (Signed)
Advanced Heart Failure Medication Review by a Pharmacist  Does the patient  feel that his/her medications are working for him/her?  yes  Has the patient been experiencing any side effects to the medications prescribed?  yes  Does the patient measure his/her own blood pressure or blood glucose at home?  yes   Does the patient have any problems obtaining medications due to transportation or finances?   no  Understanding of regimen: good Understanding of indications: good Potential of compliance: good Patient understands to avoid NSAIDs. Patient understands to avoid decongestants.  Issues to address at subsequent visits: None   Pharmacist comments:  Mrs. Hornbaker is a pleasant 60 yo F presenting with her husband and without a medication list. She reports good compliance with her medications but did state that since starting Adempas last week she has been sleeping "all the time" and having hallucinations which is not a listed AE of this medication. She also states that since increasing her dose of Uptravi to 400 mcg BID she has had intolerable "whole body pain" but was able to better tolerate the 200 mcg BID dose. I did note that she is also taking zolpidem QHS which could be causing the "hallucinations" that she describes even though she has been on it for months. She did not have any other medication-related questions or concerns for me at this time.   Tyler Deis. Bonnye Fava, PharmD, BCPS, CPP Clinical Pharmacist Pager: [phone] Phone: [phone] 06/03/2016 12:09 PM     Time with patient: 10 minutes Preparation and documentation time: 2 minutes Total time: 12 minutes

## 2016-06-03 NOTE — Progress Notes (Signed)
Echocardiogram 2D Echocardiogram has been performed.  Dorothey Baseman 06/03/2016, 11:53 AM

## 2016-06-03 NOTE — Patient Instructions (Addendum)
Routine lab work today. Will notify you of abnormal results, otherwise no news is good news!  RESTART Silexapeg 200 mcg twice daily. Sent to [facility].  Will refer you to Pulmonary Care at [facility] with Dr. [name]. Address: [address], [place], [place] [address]  Phone: [phone]  ______________________________________________  ______________________________________________  Follow up 6 weeks with [name].  Do the following things EVERYDAY: 1) Weigh yourself in the morning before breakfast. Write it down and keep it in a log. 2) Take your medicines as prescribed 3) Eat low salt foods-Limit salt (sodium) to 2000 mg per day.  4) Stay as active as you can everyday 5) Limit all fluids for the day to less than 2 liters

## 2016-06-03 NOTE — Progress Notes (Signed)
Patient ID: Kelly Donaldson, female   DOB: [birth year], 60 y.o.   MRN: [id number]   PCP: Prudy Feeler Pulmonology: Dr [name] Primary HF: Dr Shirlee Latch   60 yo with history of chronic hypoxemic respiratory failure and pulmonary hypertension.  She has been followed by Dr [name] with pulmonology.  She says that she has been on home oxygen for 6 years.  She quit smoking in 2016.  She had an echo done in 5/16 showing dilated RV with severe pulmonary hypertension.  She therefore had RHC in 5/16 which confirmed severe pulmonary hypertension and RV failure.  PFTs showed only mild obstruction and restriction with markedly decreased DLCO suggestive of pulmonary vascular disease.  There was concern for sarcoidosis on CT chest but lymph node biopsy showed no evidence for sarcoidosis.  V/Q scan showed no evidence for chronic PE.   She has not tolerated Revatio or Adcirca.  Have failed previous up-titration of Selexipag with pelvic pain. Has now been out of Selexipag x 2 weeks (Had refills for 400 mg but does not tolerate). Feels like Adempas intensifies her joint pains in her knees and joint. She feels like she has been more sleepy and per her husband is "moving her arms around" in her sleep since started Adempas.  Continues to take nightly ambien.  Denies any further drops in her blood pressure, though still has mild lightheadedness with standing. Has to stop and take breaks in a grocery store even with a shopping cart due to SOB. She does fine walking around the house. Dyspnea with steps/inclines. Denies CP or orthopnea/PND. She states she was SOB when she woke up this morning.  Weight up 4 lbs.  She is using 3L oxygen by nasal cannula.   Her right eye drifts towards the right the longer she looks at something.  This has been a problem for about 8 months but is worsening.    6 minute walk (5/16): 238 meters => decreased oxygen saturation to 73%, increased to 6 L [place] 6 minute walk (7/16): 170 meters => unable to complete.  6  minute walk (9/16): 201 meters 6 minute walk (1/17): 256 meters 6 minute walk (4/17): 219 meters   Labs (2/16): BNP 69 Labs (5/16): LFTs normal, HCT 34.9, RF negative, HIV negative, TSH negative Labs (6/16): K 4.4, creatinine 0.88 Labs (7/16): ANA negative, BNP 58, K 4.3, creatinine 1.1, anti-RNP negative Labs (9/16): K 4.4, creatinine 1.26, BNP 91 Labs (10/16): K 4.3, creatinine 1.04 Labs (12/16): K 4.2, creatinine 1.07, BNP 130 Labs (1/17): K 4.3, creatinine 1.23, BNP 42 Labs (3/17): K 4.2, creatinine 1.19 Labs (5/17): K 4.3, creatinine 1.35, BNP 44  PMH: 1. Pulmonary hypertension: RHC (5/16) with mean RA 18, PA 83/33 mean 53, mean PCWP 19, CI 2.61, PVR 5.8 WU.   Echo (5/16) with EF 65-70%, septal flattening, dilated RV with PA systolic pressure 99 mmHg, +bubble study.  PFTs (4/16) with FVC 76%, FEV1 75%, ratio 97%, TLC 66%, DLCO 26% => mild restriction, mild obstruction, severe diffuse abnormality (pulmonary vascular problem).  CTA chest (4/16) with chronic interstitial and obstructive lung disease, small mediastinal and hilar lymph noes, no PE.  Lymph node biopsy negative for sarcoidosis (reactive changes).  V/Q scan (6/16): No acute or chronic PE. TEE (6/16): Normal LV size and systolic function, EF 55-60%,RV was mildly dilated with mildly decreased systolic function, D-shaped interventricular septum suggestive of RV pressure/volume overload, there appeared to be a small PFO present with some bubbles crossing, no ASD.  ANA  negative, anti-RNP negative, HIV negative. Sleep study (8/16) without OSA.  She did not tolerate Adcirca or Revatio.  She cannot tolerate more than 200 mcg bid Selexipag.  - Echo (6/17): EF 60-65%, normal diastolic function, D-shaped interventricular septum, mildly dilated RV with normal RV systolic function, PASP 39, IVC normal.  2. Chronic venous insufficiency. 3. Hyperlipidemia.  4. GERD 5. Chronic hypoxemic respiratory failure: Hypoxemia noted x 6 years at least.      SH: Married, quit smoking in 3/16.  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    FH: No pulmonary hypertension, no premature CAD, no sudden death.   ROS: All systems reviewed and negative except as per HPI.    Current Outpatient Prescriptions  Medication Sig Dispense Refill  . albuterol (PROVENTIL HFA;VENTOLIN HFA) 108 (90 BASE) MCG/ACT inhaler Inhale 1-2 puffs into the lungs every 6 (six) hours as needed for wheezing or shortness of breath. 1 Inhaler 0  . aspirin EC 81 MG tablet Take 1 tablet (81 mg total) by mouth daily. 90 tablet 3  . cetirizine (ZYRTEC) 10 MG tablet Take 10 mg by mouth daily.    . citalopram (CELEXA) 20 MG tablet Take 40 mg by mouth every morning.     . famotidine (PEPCID) 20 MG tablet Take 20 mg by mouth daily as needed for heartburn.     . furosemide (LASIX) 40 MG tablet Take 3 tabs daily every other day ALTERNATING with 3 tabs in AM and 2 tabs in PM every other day 150 tablet 3  . HYDROcodone-acetaminophen (NORCO) 10-325 MG tablet Take 1-2 tablets by mouth every 6 (six) hours as needed for moderate pain or severe pain. 30 tablet 0  . Macitentan (OPSUMIT) 10 MG TABS Take 1 tablet by mouth daily. 90 tablet 3  . meloxicam (MOBIC) 7.5 MG tablet Take 7.5 mg by mouth daily.    Marland Kitchen omeprazole (PRILOSEC) 20 MG capsule Take 20 mg by mouth daily as needed (for acid reflux).     . potassium chloride SA (K-DUR,KLOR-CON) 20 MEQ tablet Take 1 tab daily every other day ALTERNATING with 1 tab Twice daily every other day 60 tablet 6  . pravastatin (PRAVACHOL) 20 MG tablet Take 20 mg by mouth daily.    . Riociguat (ADEMPAS) 0.5 MG TABS Take 0.5 mg by mouth 2 (two) times daily. 30 tablet 11  . Vitamin D, Ergocalciferol, (DRISDOL) 50000 UNITS CAPS capsule Take 50,000 Units by mouth every Monday.     . zolpidem (AMBIEN) 5 MG tablet Take 5 mg by mouth at bedtime.     No current facility-administered medications for this encounter.   BP 110/64 mmHg  Pulse 64  Ht 5\' 9"  (1.753  m)  Wt 256 lb (116.121 kg)  BMI 37.79 kg/m2  SpO2 98%  Wt Readings from Last 3 Encounters:  06/03/16 256 lb (116.121 kg)  04/17/16 252 lb 4 oz (114.42 kg)  03/14/16 256 lb 8 oz (116.348 kg)     General: NAD.  Neck: JVP 6-7 cm, no thyromegaly or thyroid nodule. No carotid bruit. Lungs: Mild crackles at bases bilaterally.  CV: Nondisplaced PMI.  Heart regular S1/S2, no S3, no murmur.      Abdomen: Soft, NT, ND, no HSM. No bruits or masses. +BS  Skin: Intact without lesions or rashes.  Neurologic: Alert and oriented x 3.  Psych: Normal affect. Extremities: No clubbing or cyanosis. Trace edema 1/2 to knee. Normal pedal pulses.  HEENT: Normal.   Assessment/Plan:  1. Pulmonary arterial  hypertension with RV failure: Severe. Suspect Group I PAH.  Underlying lung disease not markedly severe by PFTs.  Restrictive PFTs thought to be due to enlarged heart in chest and obesity.  There was concern from CTA chest for sarcoidosis, but lymph node biopsy was negative.  LFTs normal, ANA negative, anti-RNP negative, HIV negative.  V/Q scan negative for chronic PE.  Small PFO but no ASD on TEE.  No OSA on sleep study.  She did not tolerate Revatio due to joint pain (now resolved), and she did not tolerate Adcirca with worsening dyspnea.  She does not tolerate more than 200 mcg bid Selexipag due to pelvic pain.  Echo reviewed today and showed mildly dilated RV with normal systolic function and PASP appeared lower at 39 mmHg.  - Open lung biopsy deemed too risky, probably not sarcoid.   - Continue current Lasix, BMET/BNP today.    - Continue Opsumit. - Restart Selexipag at 200 mcg BID and do not titrate up.  She states she has been out again for 2 weeks.  - Started on riociguat 0.5 mg BID last week. Will gradually titrate this up. I do not think that this is causing her joint pain.  - Refuses 6 minute walk today due to joint pain.  Will need at next appointment.   - She also needs follow up with Dr. [name]  (hasn't seen in a year).  2. Abnormal chest CT: Chronic interstitial and obstructive lung disease, mediastinal and hilar lymphadenopathy.  Concern for sarcoidosis.  PFTs showed only mild restriction and obstruction.  Biopsy did not show sarcoid.  Deemed too high risk for open lung biopsy.  Restriction on PFTs may be due to body habitus.  - Needs pulmonary followup, will arrange.  3. Chronic hypoxemic respiratory failure: Continue oxygen up to 5 L with activity, 3L at rest.   Restart Selexipag as above.  Check BMET today on lasix.  Follow up 6 weeks with . Continue to up-titrate Adempas per Adempas HHRN.   Graciella Freer, PA-C 06/03/2016   Patient seen with PA, agree with the above note.  She has run out of Selexipag, will restart at 200 mcg bid (cannot tolerate higher dose).  Continue to uptitrate Adempas, I do not think she has had any side effects from this particular medication so far.  Volume status looks ok.  I reviewed her echo today, it looks improved from the past.   [name] 06/04/2016

## 2016-06-07 ENCOUNTER — Telehealth (HOSPITAL_COMMUNITY): Payer: Self-pay | Admitting: Pharmacist

## 2016-06-07 ENCOUNTER — Telehealth (HOSPITAL_COMMUNITY): Payer: Self-pay | Admitting: *Deleted

## 2016-06-07 NOTE — Telephone Encounter (Signed)
Spoke with pharmacist at [facility] who wanted to make sure it was ok to titrate the dose of Adempas to 1 mg TID since SBP >95 mmHg. Per discussion with [name], ok to gradually titrate up.   Kelly Donaldson. Bonnye Fava, PharmD, BCPS, CPP Clinical Pharmacist Pager: [phone] Phone: [phone] 06/07/2016 2:35 PM

## 2016-06-07 NOTE — Telephone Encounter (Signed)
Lindy with [facility] called about "titration" exception.She requested a call from our pharmacist. *msg sent to [facility]*  Please advise. [phone] opt 2 then opt 1

## 2016-06-07 NOTE — Telephone Encounter (Signed)
error 

## 2016-06-10 ENCOUNTER — Institutional Professional Consult (permissible substitution): Payer: Self-pay | Admitting: Internal Medicine

## 2016-06-12 ENCOUNTER — Encounter: Payer: Self-pay | Admitting: Internal Medicine

## 2016-06-12 ENCOUNTER — Ambulatory Visit (INDEPENDENT_AMBULATORY_CARE_PROVIDER_SITE_OTHER): Payer: Self-pay | Admitting: Internal Medicine

## 2016-06-12 VITALS — BP 110/64 | HR 77 | Ht 67.0 in | Wt 262.0 lb

## 2016-06-12 DIAGNOSIS — I272 Other secondary pulmonary hypertension: Secondary | ICD-10-CM

## 2016-06-12 DIAGNOSIS — I2721 Secondary pulmonary arterial hypertension: Secondary | ICD-10-CM

## 2016-06-12 DIAGNOSIS — J9611 Chronic respiratory failure with hypoxia: Secondary | ICD-10-CM

## 2016-06-12 DIAGNOSIS — D869 Sarcoidosis, unspecified: Secondary | ICD-10-CM

## 2016-06-12 NOTE — Progress Notes (Signed)
Subjective:    Patient ID: Kelly Donaldson, female    DOB: March 23, 1956,   MRN: [id number]  HPI  Brief patient profile:  31  yobf former smoker quit 02/2015 on noct 02 2lpm x 2013 and daytime doe x more than [employer] off 02 but losing ground gradually since onset  assoc with leg pain/ swelling = bilaterally already eval [place] pulmonary doctor = Orson Aloe rec wear 02 no dx so referred to pulmonary clinic 01/11/15 by Samuel Jester   History of Present Illness  01/11/2015 1st [place] office visit/ [facility]   Chief Complaint  Patient presents with  . Pulmonary Consult    Referred by Dr. Charm Barges. Pt c/o DOE x 2 yrs worse x 3 months. She states DOE seems worse in the evenings. She gets out of breath walking up stairs or "doing too much". She also c/o cough- prod with clear sputum and has trouble swallowing often.   chronic doe x years much worse x 3 months assoc with dry daytime cough and mild dysphagia/ also worse leg swelling just using 02 at hs  Prev w/u in [place] "neg" per pt, only rx= 02 rec  We will request  the last discharge summary from [facility] to leave 02 off sitting   but wear 2lpm at bedtime and with walking always use = 2lpm  Or goal of over 90% if you have a monitor  The key is to stop smoking completely before smoking completely stops you!  Wear the elastic stockings as much as possible Try prilosec 20mg   Take 30-60 min before first meal of the day and Pepcid 20 mg one bedtime until  Return (over the counter)  GERD diet   W/u   - pulmonary eval [place] last seen 12/17/12 with "neg CTa for PE, pfts ok except dlco 20%, echo c/w  PH, 02 dep hypoxemia > rec RHC and stop phenterimine" -Echo 09/15/14     LAE mildly dilated, as are RV and RA  - 01/11/2015   Walked 2lpm  x one lap @ 185 stopped due to  Sob, nl pace, no desat - Quit smoking 03/02/15  - PFTs 03/20/2015  Nl except for DLCO 26 corrects to 35%  - 03/20/2015   Walked 2lpm x one lap @ 185 stopped due to  sob/ desat corrected on 4lpm  - 04/07/2015 rec  referral to Dr 04/09/2015 for Right heart Cath - Repeat Echo with bubble study 04/19/2015 > With injection of agitated saline intravenously there is appearance of bubbles in L sided chambers consistent - ax node bx 04/19/2015  > nl LN with 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 > 05/11/15 Pos small PFO  rec as of 03/20/2015 2lpm 24/7 except 6lpm walking outside of house         06/12/2016  Re Consultation ov/[facility] re: PH/ small pfo with hypoxemia  On 2-3lpm baseline/ 6 lpm walking  Chief Complaint  Patient presents with  . Pulmonary Consult    Pt seen here in the past and is being referred back by Dr. 08/13/2016. She was recently admitted to [facility] for dehydration, and states her breathing has been worse since then. She is SOB just walking from room to room at home.   was doing ok on 2lpm at 6lpm walking / insists she was feeling better on just the opsimut vs additiona PH meds Acutely worse x 2 days pta at [facility] with low bp  Onset of worsening was 2 weeks p  started adempas  No obvious day to day or daytime variability or assoc excess/ purulent sputum or mucus plugs or hemoptysis or cp or chest tightness, subjective wheeze or overt sinus or hb symptoms. No unusual exp hx or h/o childhood pna/ asthma or knowledge of premature birth.  Sleeping ok without nocturnal  or early am exacerbation  of respiratory  c/o's or need for noct saba. Also denies any obvious fluctuation of symptoms with weather or environmental changes or other aggravating or alleviating factors except as outlined above   Current Medications, Allergies, Complete Past Medical History, Past Surgical History, Family History, and Social History were reviewed in [employer] record.           Review of Systems  Constitutional: Negative for fever, chills and unexpected weight change.  HENT: Negative for congestion, dental problem, ear pain, nosebleeds, postnasal  drip, rhinorrhea, sinus pressure, sneezing, sore throat, trouble swallowing and voice change.   Eyes: Negative for visual disturbance.  Respiratory: Positive for shortness of breath. Negative for cough and choking.   Cardiovascular: Positive for leg swelling. Negative for chest pain.  Gastrointestinal: Negative for vomiting, abdominal pain and diarrhea.       Indigestion  Genitourinary: Negative for difficulty urinating.  Musculoskeletal: Positive for arthralgias.  Skin: Negative for rash.  Neurological: Negative for tremors, syncope and headaches.  Hematological: Does not bruise/bleed easily.       Objective:   Physical Exam  amb bf nad  Wt Readings from Last 3 Encounters:  06/12/16 262 lb (118.842 kg)  06/03/16 256 lb (116.121 kg)  04/17/16 252 lb 4 oz (114.42 kg)    Vital signs reviewed  - note sats 97% 3lpm at rest   HEENT: nl dentition, turbinates, and oropharynx. Nl external ear canals without cough reflex   NECK :  without JVD/Nodes/TM/ nl carotid upstrokes bilaterally   LUNGS: no acc muscle use,  Nl contour chest which is clear to A and P bilaterally without cough on insp or exp maneuvers   CV:  RRR  no s3 or murmur - Pos increase in P2,  Trace bilateral ankle/foot edema   ABD:  soft and nontender with nl inspiratory excursion in the supine position. No bruits or organomegaly, bowel sounds nl  MS:  Nl gait/ ext warm without deformities, calf tenderness, cyanosis or clubbing No obvious joint restrictions   SKIN: warm and dry without lesions    NEURO:  alert, approp, nl sensorium with  no motor deficits     No recent cxr's       Assessment & Plan:

## 2016-06-12 NOTE — Patient Instructions (Addendum)
Follow [facility]'s instructions re stopping medications you feel are making you sick  We will set you up with Dr [name], our pulmonary hypertension specialist, for a third opinion on options   Goal is to keep the 02 level above 90% at all times

## 2016-06-13 ENCOUNTER — Encounter: Payer: Self-pay | Admitting: Internal Medicine

## 2016-06-13 NOTE — Assessment & Plan Note (Signed)
-     ax node bx 04/19/2015  > nl LN  No evidence of active sarcoid here but hard to exclude here - I don't favor empirical rx given the unlikely benefit and the risk of side effects

## 2016-06-13 NOTE — Assessment & Plan Note (Signed)
rec as of 06/12/2016  3ipm 24/7 except 6lpm walking outside of house     Most likely when she does get hypoxic her PFO causes even worse shunting, worse hypoxemia in a snowball effect, advised to either monitor sats or just keep at the higher 02 flow for now.

## 2016-06-13 NOTE — Assessment & Plan Note (Signed)
Bubble echo 04/13/15 with R to L shunt RHC 04/16/15 RHC:  Hemodynamics RA mean 18 RV 81/18 PA 83/33, mean 53 PCWP mean 19 Oxygen saturations: PA 63% AO 98% Cardiac Output (Fick) 5.82  Cardiac Index (Fick) 2.61 PVR 5.8 WU Rec:  Lasix/ masitentan   She insists she was better kust on the masitentan but esp worse p adempas > I have very little  experience with any of these meds so asked her to check with Dr Shirlee Latch before she  Makes any changes.  I did offer Dr Ulyses Jarred opinion to see if he has any ideas to help her but other than 02 at this point I really can't see any options.

## 2016-07-15 ENCOUNTER — Telehealth: Payer: Self-pay | Admitting: Cardiovascular Disease

## 2016-07-15 NOTE — Telephone Encounter (Signed)
New Message  Pt c/o medication issue:  1. Name of Medication: Adempas  2. How are you currently taking this medication (dosage and times per day)? 0.5mg , 3X's per day  3. Are you having a reaction (difficulty breathing--STAT)? With the 1mg  yes, 0.5mg  no  4. What is your medication issue? Unable to tolerate the 1mg  and wants a recommendation on what her options are.

## 2016-07-15 NOTE — Telephone Encounter (Signed)
[name] pt. Followed in heart failure clinic.

## 2016-07-15 NOTE — Telephone Encounter (Signed)
Spoke w/pt she states the Adempas 1 mg made her feel very achy and sore all over like if she had the flu.  She states she cut in half, to 0.5 mg TID and she feels better, no SE.  She has discussed this with [facility] pharmacy and her home RN who have advised this was ok.  Pt will continue that dose for now and is sch for f/u with Dr Shirlee Latch on Tue 8/15

## 2016-07-16 ENCOUNTER — Telehealth (HOSPITAL_COMMUNITY): Payer: Self-pay

## 2016-07-16 MED ORDER — RIOCIGUAT 1 MG PO TABS: 0.5000 mg | ORAL_TABLET | Freq: Three times a day (TID) | ORAL | Status: DC

## 2016-07-16 NOTE — Telephone Encounter (Signed)
[facility] pharmacy called to verify patient dose of Adempas. Patient had been ordered 1 mg TID, however, patient reported having flu-like symptoms with 1 mg dose and backed down back to 0.5 mg. Per [name], ok to keep this dose at 0.5 mg TID until seen by him on 07/23/16. Will update Rx in chart.  Adempas agreeable to send out 1 month supply of 0.5 mg TID dose.  Ave Filter RN

## 2016-07-23 ENCOUNTER — Encounter (HOSPITAL_COMMUNITY): Payer: Self-pay

## 2016-07-23 ENCOUNTER — Ambulatory Visit (HOSPITAL_COMMUNITY)
Admission: RE | Admit: 2016-07-23 | Discharge: 2016-07-23 | Disposition: A | Discharge status: Home / Self Care | Payer: Self-pay | Source: Ambulatory Visit | Attending: Cardiology | Admitting: Cardiology

## 2016-07-23 VITALS — BP 105/50 | HR 73 | Wt 251.5 lb

## 2016-07-23 DIAGNOSIS — I2721 Secondary pulmonary arterial hypertension: Secondary | ICD-10-CM

## 2016-07-23 DIAGNOSIS — J9611 Chronic respiratory failure with hypoxia: Secondary | ICD-10-CM | POA: Insufficient documentation

## 2016-07-23 DIAGNOSIS — Z6839 Body mass index (BMI) 39.0-39.9, adult: Secondary | ICD-10-CM | POA: Insufficient documentation

## 2016-07-23 DIAGNOSIS — I872 Venous insufficiency (chronic) (peripheral): Secondary | ICD-10-CM | POA: Insufficient documentation

## 2016-07-23 DIAGNOSIS — D869 Sarcoidosis, unspecified: Secondary | ICD-10-CM

## 2016-07-23 DIAGNOSIS — K219 Gastro-esophageal reflux disease without esophagitis: Secondary | ICD-10-CM | POA: Insufficient documentation

## 2016-07-23 DIAGNOSIS — Z87891 Personal history of nicotine dependence: Secondary | ICD-10-CM | POA: Insufficient documentation

## 2016-07-23 DIAGNOSIS — E785 Hyperlipidemia, unspecified: Secondary | ICD-10-CM | POA: Insufficient documentation

## 2016-07-23 DIAGNOSIS — Z7982 Long term (current) use of aspirin: Secondary | ICD-10-CM | POA: Insufficient documentation

## 2016-07-23 DIAGNOSIS — Z79899 Other long term (current) drug therapy: Secondary | ICD-10-CM | POA: Insufficient documentation

## 2016-07-23 DIAGNOSIS — I272 Other secondary pulmonary hypertension: Secondary | ICD-10-CM | POA: Insufficient documentation

## 2016-07-23 DIAGNOSIS — I5032 Chronic diastolic (congestive) heart failure: Secondary | ICD-10-CM | POA: Insufficient documentation

## 2016-07-23 DIAGNOSIS — E669 Obesity, unspecified: Secondary | ICD-10-CM | POA: Insufficient documentation

## 2016-07-23 LAB — BASIC METABOLIC PANEL
ANION GAP: 10 (ref 5–15)
BUN: 10 mg/dL (ref 6–20)
CO2: 26 mmol/L (ref 22–32)
Calcium: 9.2 mg/dL (ref 8.9–10.3)
Chloride: 102 mmol/L (ref 101–111)
Creatinine, Ser: 0.92 mg/dL (ref 0.44–1.00)
GFR calc Af Amer: >60 mL/min (ref >60)
Glucose, Bld: 118 mg/dL — ABNORMAL HIGH (ref 65–99)
POTASSIUM: 4 mmol/L (ref 3.5–5.1)
SODIUM: 138 mmol/L (ref 135–145)

## 2016-07-23 LAB — BRAIN NATRIURETIC PEPTIDE: B NATRIURETIC PEPTIDE 5: 60.5 pg/mL (ref 0.0–100.0)

## 2016-07-23 NOTE — Progress Notes (Signed)
6 min walk test preformed.  Pt ambulated 740 ft (225.5 m), on 4 L O2 sats ranged from 79-94%, HR ranged from 92-105

## 2016-07-23 NOTE — Progress Notes (Signed)
Patient ID: Kelly Donaldson, female   DOB: 1956/11/14, 60 y.o.   MRN: [id number]   PCP: Prudy Feeler Pulmonology: Dr [name] Primary HF: Dr Shirlee Latch   60 yo with history of chronic hypoxemic respiratory failure and pulmonary hypertension.  She has been followed by Dr [name] with pulmonology.  She says that she has been on home oxygen for 6 years.  She quit smoking in 2016.  She had an echo done in 5/16 showing dilated RV with severe pulmonary hypertension.  She therefore had RHC in 5/16 which confirmed severe pulmonary hypertension and RV failure.  PFTs showed only mild obstruction and restriction with markedly decreased DLCO suggestive of pulmonary vascular disease.  There was concern for sarcoidosis on CT chest but lymph node biopsy showed no evidence for sarcoidosis.  V/Q scan showed no evidence for chronic PE.   She has not tolerated Revatio or Adcirca.  Have failed previous up-titration of Selexipag with pelvic pain, can tolerate 200 mcg bid.  She is on riociguat now at 0.5 mg tid, unable to tolerate 1 mg tid due to flu-like symptoms.   She is short of breath after walking 50-100 yards depending on the day.  She is able to go into stores and walks now rather than using the motorized cart at the grocery store.  No lightheadedness.  No syncope.  No chest pain.  She is generally fatigued.  She saw Dr [name] recently.  Weight is down 5 lbs.   6 minute walk (5/16): 238 meters => decreased oxygen saturation to 73%, increased to 6 L [place] 6 minute walk (7/16): 170 meters => unable to complete.  6 minute walk (9/16): 201 meters 6 minute walk (1/17): 256 meters 6 minute walk (4/17): 219 meters 6 minute walk (8/17): 225 meters  Labs (2/16): BNP 69 Labs (5/16): LFTs normal, HCT 34.9, RF negative, HIV negative, TSH negative Labs (6/16): K 4.4, creatinine 0.88 Labs (7/16): ANA negative, BNP 58, K 4.3, creatinine 1.1, anti-RNP negative Labs (9/16): K 4.4, creatinine 1.26, BNP 91 Labs (10/16): K 4.3, creatinine  1.04 Labs (12/16): K 4.2, creatinine 1.07, BNP 130 Labs (1/17): K 4.3, creatinine 1.23, BNP 42 Labs (3/17): K 4.2, creatinine 1.19 Labs (5/17): K 4.3, creatinine 1.35, BNP 44 Labs (6/17): K 4.4, creatinine 1.18, BNP 44  PMH: 1. Pulmonary hypertension: RHC (5/16) with mean RA 18, PA 83/33 mean 53, mean PCWP 19, CI 2.61, PVR 5.8 WU.   Echo (5/16) with EF 65-70%, septal flattening, dilated RV with PA systolic pressure 99 mmHg, +bubble study.  PFTs (4/16) with FVC 76%, FEV1 75%, ratio 97%, TLC 66%, DLCO 26% => mild restriction, mild obstruction, severe diffuse abnormality (pulmonary vascular problem).  CTA chest (4/16) with chronic interstitial and obstructive lung disease, small mediastinal and hilar lymph noes, no PE.  Lymph node biopsy negative for sarcoidosis (reactive changes).  V/Q scan (6/16): No acute or chronic PE. TEE (6/16): Normal LV size and systolic function, EF 55-60%,RV was mildly dilated with mildly decreased systolic function, D-shaped interventricular septum suggestive of RV pressure/volume overload, there appeared to be a small PFO present with some bubbles crossing, no ASD.  ANA negative, anti-RNP negative, HIV negative. Sleep study (8/16) without OSA.  She did not tolerate Adcirca or Revatio.  She cannot tolerate more than 200 mcg bid Selexipag.  - Echo (6/17): EF 60-65%, normal diastolic function, D-shaped interventricular septum, mildly dilated RV with normal RV systolic function, PASP 39, IVC normal.  2. Chronic venous insufficiency. 3. Hyperlipidemia.  4. GERD 5. Chronic hypoxemic respiratory failure: Hypoxemia noted x 6 years at least.    SH: Married, quit smoking in 3/16.  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    FH: No pulmonary hypertension, no premature CAD, no sudden death.   ROS: All systems reviewed and negative except as per HPI.    Current Outpatient Prescriptions  Medication Sig Dispense Refill  . aspirin EC 81 MG tablet Take 1 tablet (81 mg  total) by mouth daily. 90 tablet 3  . cetirizine (ZYRTEC) 10 MG tablet Take 10 mg by mouth daily.    . citalopram (CELEXA) 40 MG tablet Take 40 mg by mouth daily.    . furosemide (LASIX) 40 MG tablet Take 40-80 mg by mouth as directed. Take 40mg  every other day alternating with 80mg  every other day    . HYDROcodone-acetaminophen (NORCO) 10-325 MG tablet Take 1-2 tablets by mouth every 6 (six) hours as needed for moderate pain or severe pain. 30 tablet 0  . Macitentan (OPSUMIT) 10 MG TABS Take 1 tablet by mouth daily. 90 tablet 3  . OXYGEN 2.5 lpm with sleep and 4 lpm with exertion  [employer]    . potassium chloride SA (K-DUR,KLOR-CON) 20 MEQ tablet Take 1 tab daily every other day ALTERNATING with 1 tab Twice daily every other day 60 tablet 6  . pravastatin (PRAVACHOL) 20 MG tablet Take 20 mg by mouth daily.    . Riociguat (ADEMPAS) 1 MG TABS Take 0.5 mg by mouth 3 (three) times daily. 90 tablet   . Selexipag 200 MCG TABS Take 1 tablet (200 mcg total) by mouth 2 (two) times daily. 60 tablet 6  . Vitamin D, Ergocalciferol, (DRISDOL) 50000 UNITS CAPS capsule Take 50,000 Units by mouth every Monday.     . zolpidem (AMBIEN) 5 MG tablet Take 5 mg by mouth at bedtime.     No current facility-administered medications for this encounter.    BP (!) 105/50   Pulse 73   Wt 251 lb 8 oz (114.1 kg)   SpO2 98% Comment: on 3L of O2  BMI 39.39 kg/m   Wt Readings from Last 3 Encounters:  07/23/16 251 lb 8 oz (114.1 kg)  06/12/16 262 lb (118.8 kg)  06/03/16 256 lb (116.1 kg)     General: NAD.  Neck: JVP 8 cm, no thyromegaly or thyroid nodule. No carotid bruit. Lungs: Mild crackles at bases bilaterally.  CV: Nondisplaced PMI.  Heart regular S1/S2, no S3, no murmur.      Abdomen: Soft, NT, ND, no HSM. No bruits or masses. +BS  Skin: Intact without lesions or rashes.  Neurologic: Alert and oriented x 3.  Psych: Normal affect. Extremities: No clubbing or cyanosis. Trace ankle edema. Normal pedal  pulses.  HEENT: Normal.   Assessment/Plan:  1. 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  Underlying lung disease not markedly severe by PFTs.  Restrictive PFTs thought to be due to enlarged heart in chest and obesity.  There was concern from CTA chest for sarcoidosis, but lymph node biopsy was negative.  LFTs normal, ANA negative, anti-RNP negative, HIV negative.  V/Q scan negative for chronic PE.  Small PFO but no ASD on TEE.  No OSA on sleep study.  She did not tolerate Revatio due to joint pain (now resolved), and she did not tolerate Adcirca with worsening dyspnea.  She does not tolerate more than 200 mcg bid Selexipag due to pelvic pain and she does  not tolerate more than 0.5 mg tid of riociguat due to flu-like symptoms.  Echo 6/17 showed mildly dilated RV with normal systolic function and PASP appeared lower at 39 mmHg. Echo has looked better with PH treatment, but her symptoms and 6 minute walk have been fairly unchanged.  Volume status looks ok on exam.   - Open lung biopsy deemed too risky, probably not sarcoid.   - Continue current Lasix, BMET/BNP today.    - Continue Opsumit. - Continue Selexipag at 200 mcg BID and do not titrate up given lack of tolerance. - Continue riociguat 0.5 mg BID, she has been unable to titrate up. - Not much room for further treatment of pulmonary hypertension.  As above, echo looks better but symptoms and 6 min walk basically the same.  I suspect her underlying lung disease may be driving her ongoing symptoms.   2. Abnormal chest CT: Chronic interstitial and obstructive lung disease, mediastinal and hilar lymphadenopathy.  Concern for sarcoidosis.  PFTs showed only mild restriction and obstruction.  Biopsy did not show sarcoid.  Deemed too high risk for open lung biopsy.  Restriction on PFTs may be due to body habitus.  - Saw Dr [name], to see Dr [name].   3. Chronic hypoxemic respiratory failure: Continue oxygen up to 5 L with  activity, 3L at rest.   [name] 07/23/2016

## 2016-07-23 NOTE — Patient Instructions (Signed)
Labs today  We will contact you in 2 months to schedule your next appointment.  

## 2016-07-29 DIAGNOSIS — I270 Primary pulmonary hypertension: Secondary | ICD-10-CM

## 2016-08-06 ENCOUNTER — Encounter: Payer: Self-pay | Admitting: Pulmonary Disease

## 2016-08-06 ENCOUNTER — Other Ambulatory Visit: Payer: Self-pay

## 2016-08-06 ENCOUNTER — Ambulatory Visit (INDEPENDENT_AMBULATORY_CARE_PROVIDER_SITE_OTHER): Payer: Self-pay | Admitting: Pulmonary Disease

## 2016-08-06 VITALS — BP 118/64 | HR 67 | Ht 67.0 in | Wt 253.8 lb

## 2016-08-06 DIAGNOSIS — J849 Interstitial pulmonary disease, unspecified: Secondary | ICD-10-CM

## 2016-08-06 DIAGNOSIS — I272 Other secondary pulmonary hypertension: Secondary | ICD-10-CM

## 2016-08-06 DIAGNOSIS — I2721 Secondary pulmonary arterial hypertension: Secondary | ICD-10-CM

## 2016-08-06 NOTE — Progress Notes (Signed)
Subjective:    Patient ID: Kelly Donaldson, female    DOB: 05-Jul-1956, 60 y.o.   MRN: [id number]  HPI Chief Complaint  Patient presents with  . Follow-up    referred by MW for pulm htn.  pt c/o sob with exertion, occasional PND.     This is a pleasant 60 year old female with a past medical history significant for pulmonary hypertension who has been referred to my clinic for evaluation of the same. She was first diagnosed with pulmonary hypertension and chronic respiratory failure with hypoxemia approximately 4 years ago. 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 She noted significant shortness of breath a fairly abrupt onset. She was initially evaluated by the pulmonary team in [place] but was never told there is anything wrong. She finally coming to the cardiology clinic in [place] where she had a right heart catheterization that showed evidence of pulmonary hypertension. At that time showed a mildly elevated wedge pressures well. She was treated with pulmonary vasodilator therapy as well as diuretic therapy. During this time she's had some improvement but she's noted significant side effects to multiple medications. She's currently on 3 separate pulmonary vasodilators. She's also using oxygen at this time.  She's been followed by Dr. [name] here in this clinic who ordered a needle biopsy of her axillary lymph nodes. This showed normal lymph node architecture without evidence of underlying granulomas or other abnormalities. 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  She's been referred to my clinic for second opinion on management of her pulmonary hypertension.  Past Medical History:  Diagnosis Date  . Allergy   . Depression   . GERD (gastroesophageal reflux disease)   . Hyperlipidemia   . Oxygen dependent   . Shortness of breath dyspnea   .  Varicose veins      Family History  Problem Relation Age of Onset  . Hyperlipidemia Mother   . Hypertension Mother   . Diabetes Mother   . Other Mother     varicose veins  . Hyperlipidemia Father   . Hypertension Father   . Hypertension Daughter   . Emphysema Father     smoked  . Asthma Sister   . Clotting disorder Daughter   . Breast cancer Maternal Grandmother   . Ovarian cancer Maternal Aunt      Social History   Social History  . Marital status: Married    Spouse name: N/A  . Number of children: N/A  . Years of education: N/A   Occupational History  . Unemployed    Social History Main Topics  . Smoking status: Former Smoker    Packs/day: 0.25    Years: 17.00    Types: Cigarettes    Quit date: 03/02/2015  . Smokeless tobacco: Never Used  . Alcohol use No  . 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 Sexual activity: Not on file   Other Topics Concern  . Not on file   Social History Narrative  . No narrative on file     Allergies  Allergen Reactions  . Gabapentin     Pt states that she can not take with antidepressants  . Latex Rash     Outpatient Medications Prior to Visit  Medication Sig Dispense  Refill  . aspirin EC 81 MG tablet Take 1 tablet (81 mg total) by mouth daily. 90 tablet 3  . cetirizine (ZYRTEC) 10 MG tablet Take 10 mg by mouth daily.    . citalopram (CELEXA) 40 MG tablet Take 40 mg by mouth daily.    . furosemide (LASIX) 40 MG tablet Take 40-80 mg by mouth as directed. Take 40mg  every other day alternating with 80mg  every other day    . HYDROcodone-acetaminophen (NORCO) 10-325 MG tablet Take 1-2 tablets by mouth every 6 (six) hours as needed for moderate pain or severe pain. 30 tablet 0  . Macitentan (OPSUMIT) 10 MG TABS Take 1 tablet by mouth daily. 90 tablet 3  . OXYGEN 2.5 lpm with sleep and 4 lpm with exertion  [facility]    . potassium chloride SA (K-DUR,KLOR-CON) 20 MEQ tablet Take 1 tab  daily every other day ALTERNATING with 1 tab Twice daily every other day 60 tablet 6  . pravastatin (PRAVACHOL) 20 MG tablet Take 20 mg by mouth daily.    . Riociguat (ADEMPAS) 1 MG TABS Take 0.5 mg by mouth 3 (three) times daily. 90 tablet   . Selexipag 200 MCG TABS Take 1 tablet (200 mcg total) by mouth 2 (two) times daily. 60 tablet 6  . Vitamin D, Ergocalciferol, (DRISDOL) 50000 UNITS CAPS capsule Take 50,000 Units by mouth every Monday.     . zolpidem (AMBIEN) 5 MG tablet Take 5 mg by mouth at bedtime.     No facility-administered medications prior to visit.       Review of Systems  Constitutional: Positive for fatigue. Negative for chills and fever.  HENT: Negative for rhinorrhea, sinus pressure and sneezing.   Eyes: Negative for pain, redness and itching.  Respiratory: Positive for shortness of breath. Negative for cough and wheezing.   Cardiovascular: Positive for leg swelling. Negative for chest pain and palpitations.  Gastrointestinal: Negative for constipation, diarrhea and nausea.       Objective:   Physical Exam Vitals:   08/06/16 1544  BP: 118/64  Pulse: 67  SpO2: 91%  Weight: 253 lb 12.8 oz (115.1 kg)  Height: 5\' 7"  (1.702 m)   3L [place]  Gen: chronically ill appearing, no acute distress HENT: NCAT, OP clear, neck supple without masses Eyes: PERRL, EOMi Lymph: no cervical lymphadenopathy PULM: few crackles upper lobes B CV: RRR, no mgr, no JVD GI: BS+, soft, nontender, no hsm Derm: no rash or skin breakdown MSK: normal bulk and tone Neuro: A&Ox4, CN II-XII intact, strength 5/5 in all 4 extremities Psyche: normal mood and affect  Notes from cardiology reviewed where she was treated for pulmonary hypertension with diuretics and pulmonary vasodilator therapy  CT chest images from April 2016 personally reviewed showing a fibrotic process in the upper lobes extending into the periphery of the lower lobes, certainly worse in the upper lobes and the lower lobes are  appears to be groundglass in the upper lobes. There is no pulmonary embolism      Assessment & Plan:  PAH (pulmonary artery hypertension) (HCC) She has significant pulmonary hypertension as demonstrated by her May 2016 right heart catheterization which showed a mean PA pressure of 53 mmHg. Since then she's been treated with 3 separate pulmonary hypertension medications which have been associated with a mild improvement in shortness of breath and a significant improvement in leg swelling. Her echocardiogram this year showed improve estimated RV pressures.  Notably she had a right to left shunt on her  echocardiogram in May 2016.  I really don't have much to add to her current pulmonary vasodilator regimen as it seems that she's on as much as she is able to tolerate without significant side effect. Beyond her current level of oral treatment she would need to be considered for IV vasodilator therapy and I'm not sure she is there right now.  ILD (interstitial lung disease) (HCC) The underlying cause of her pulmonary fibrosis is uncertain. She has significant lymphadenopathy which when biopsied earlier this year (axillary lymph node) did not show evidence of granulomas to suggest sarcoidosis. I question whether or not she may have hypersensitivity pneumonitis given the upper lobe predominance of the fibrotic changes and the significant mold in her home.   The only way to know for certain what happening in her lungs are be to perform an open lung biopsy. I agree with Dr. [name] that the risk of an open lung biopsy would be quite high considering her pulmonary hypertension.  Plan: Repeat high-resolution CT scan of the chest Sent hypersensitivity pneumonitis panel If those are negative consider bronchoscopy with BAL and/or mediastinal lymph node biopsy    Current Outpatient Prescriptions:  .  aspirin EC 81 MG tablet, Take 1 tablet (81 mg total) by mouth daily., Disp: 90 tablet, Rfl: 3 .  cetirizine  (ZYRTEC) 10 MG tablet, Take 10 mg by mouth daily., Disp: , Rfl:  .  citalopram (CELEXA) 40 MG tablet, Take 40 mg by mouth daily., Disp: , Rfl:  .  furosemide (LASIX) 40 MG tablet, Take 40-80 mg by mouth as directed. Take 40mg  every other day alternating with 80mg  every other day, Disp: , Rfl:  .  HYDROcodone-acetaminophen (NORCO) 10-325 MG tablet, Take 1-2 tablets by mouth every 6 (six) hours as needed for moderate pain or severe pain., Disp: 30 tablet, Rfl: 0 .  Macitentan (OPSUMIT) 10 MG TABS, Take 1 tablet by mouth daily., Disp: 90 tablet, Rfl: 3 .  OXYGEN, 2.5 lpm with sleep and 4 lpm with exertion  [facility], Disp: , Rfl:  .  potassium chloride SA (K-DUR,KLOR-CON) 20 MEQ tablet, Take 1 tab daily every other day ALTERNATING with 1 tab Twice daily every other day, Disp: 60 tablet, Rfl: 6 .  pravastatin (PRAVACHOL) 20 MG tablet, Take 20 mg by mouth daily., Disp: , Rfl:  .  Riociguat (ADEMPAS) 1 MG TABS, Take 0.5 mg by mouth 3 (three) times daily., Disp: 90 tablet, Rfl:  .  Selexipag 200 MCG TABS, Take 1 tablet (200 mcg total) by mouth 2 (two) times daily., Disp: 60 tablet, Rfl: 6 .  Vitamin D, Ergocalciferol, (DRISDOL) 50000 UNITS CAPS capsule, Take 50,000 Units by mouth every Monday. , Disp: , Rfl:  .  zolpidem (AMBIEN) 5 MG tablet, Take 5 mg by mouth at bedtime., Disp: , Rfl:

## 2016-08-06 NOTE — Patient Instructions (Signed)
We are going to order a high-resolution CT scan of your chest and some blood work and then call you with both results. I will see you back in one month or sooner if needed

## 2016-08-06 NOTE — Assessment & Plan Note (Signed)
The underlying cause of her pulmonary fibrosis is uncertain. She has significant lymphadenopathy which when biopsied earlier this year (axillary lymph node) did not show evidence of granulomas to suggest sarcoidosis. I question whether or not she may have hypersensitivity pneumonitis given the upper lobe predominance of the fibrotic changes and the significant mold in her home.   The only way to know for certain what happening in her lungs are be to perform an open lung biopsy. I agree with Dr. [name] that the risk of an open lung biopsy would be quite high considering her pulmonary hypertension.  Plan: Repeat high-resolution CT scan of the chest Sent hypersensitivity pneumonitis panel If those are negative consider bronchoscopy with BAL and/or mediastinal lymph node biopsy

## 2016-08-06 NOTE — Assessment & Plan Note (Signed)
She has significant pulmonary hypertension as demonstrated by her May 2016 right heart catheterization which showed a mean PA pressure of 53 mmHg. Since then she's been treated with 3 separate pulmonary hypertension medications which have been associated with a mild improvement in shortness of breath and a significant improvement in leg swelling. Her echocardiogram this year showed improve estimated RV pressures.  Notably she had a right to left shunt on her echocardiogram in May 2016.  I really don't have much to add to her current pulmonary vasodilator regimen as it seems that she's on as much as she is able to tolerate without significant side effect. Beyond her current level of oral treatment she would need to be considered for IV vasodilator therapy and I'm not sure she is there right now.

## 2016-08-09 LAB — HYPERSENSITIVITY PNEUMONITIS
A. PULLULANS ABS: NEGATIVE
A.Fumigatus #1 Abs: NEGATIVE
Micropolyspora faeni, IgG: NEGATIVE
PIGEON SERUM ABS: NEGATIVE
THERMOACT. SACCHARII: NEGATIVE
Thermoactinomyces vulgaris, IgG: NEGATIVE

## 2016-08-16 ENCOUNTER — Telehealth: Payer: Self-pay | Admitting: Pulmonary Disease

## 2016-08-16 NOTE — Telephone Encounter (Signed)
Hypersensitivity Pneumonitis  Order: [id number]  Status:  Final result Visible to patient:  Yes (MyChart) Dx:  PAH (pulmonary artery hypertension) (...  Notes Recorded by Velvet Bathe, CMA on 08/15/2016 at 4:38 PM EDT lmtcb X2 for pt to relay results/recs.  ------  Notes Recorded by Velvet Bathe, CMA on 08/09/2016 at 5:36 PM EDT lmtcb X1 for pt to relay results/recs.  ------  Notes Recorded by Lupita Leash, MD on 08/09/2016 at 1:43 PM EDT A, Please let the patient know this was OK Thanks, B     Pt aware of results. Nothing more needed at this time.

## 2016-08-19 NOTE — Progress Notes (Signed)
Called spoke with pt. Reviewed results and recs. Pt voiced understanding and had no further questions.

## 2016-08-20 ENCOUNTER — Ambulatory Visit (INDEPENDENT_AMBULATORY_CARE_PROVIDER_SITE_OTHER)
Admission: RE | Admit: 2016-08-20 | Discharge: 2016-08-20 | Disposition: A | Discharge status: Home / Self Care | Payer: Self-pay | Source: Ambulatory Visit | Attending: Pulmonary Disease | Admitting: Pulmonary Disease

## 2016-08-20 DIAGNOSIS — I272 Other secondary pulmonary hypertension: Secondary | ICD-10-CM

## 2016-08-20 DIAGNOSIS — I2721 Secondary pulmonary arterial hypertension: Secondary | ICD-10-CM

## 2016-08-21 ENCOUNTER — Telehealth: Payer: Self-pay

## 2016-08-21 NOTE — Telephone Encounter (Signed)
Spoke with pt, aware of results/recs.  Nothing further needed.  

## 2016-08-21 NOTE — Telephone Encounter (Signed)
-----   Message from Lupita Leash, MD sent at 08/21/2016  2:49 PM EDT ----- A, Please let her know that her CT scan did not suggest we need to do a biopsy or bronchoscopy to evaluate her pulmonary fibrosis.  I agree with her current treatment regimen. Thanks B

## 2016-08-30 ENCOUNTER — Encounter: Payer: Self-pay | Admitting: Physician Assistant

## 2016-08-30 ENCOUNTER — Ambulatory Visit (INDEPENDENT_AMBULATORY_CARE_PROVIDER_SITE_OTHER): Payer: Self-pay | Admitting: Physician Assistant

## 2016-08-30 VITALS — BP 101/64 | HR 63 | Temp 96.9°F | Ht 67.0 in | Wt 250.8 lb

## 2016-08-30 DIAGNOSIS — I872 Venous insufficiency (chronic) (peripheral): Secondary | ICD-10-CM

## 2016-08-30 DIAGNOSIS — I5032 Chronic diastolic (congestive) heart failure: Secondary | ICD-10-CM

## 2016-08-30 DIAGNOSIS — D869 Sarcoidosis, unspecified: Secondary | ICD-10-CM

## 2016-08-30 DIAGNOSIS — J9611 Chronic respiratory failure with hypoxia: Secondary | ICD-10-CM

## 2016-08-30 DIAGNOSIS — J849 Interstitial pulmonary disease, unspecified: Secondary | ICD-10-CM

## 2016-08-30 DIAGNOSIS — G4700 Insomnia, unspecified: Secondary | ICD-10-CM

## 2016-08-30 DIAGNOSIS — F32A Depression, unspecified: Secondary | ICD-10-CM

## 2016-08-30 DIAGNOSIS — F329 Major depressive disorder, single episode, unspecified: Secondary | ICD-10-CM

## 2016-08-30 DIAGNOSIS — M5136 Other intervertebral disc degeneration, lumbar region: Secondary | ICD-10-CM

## 2016-08-30 DIAGNOSIS — E785 Hyperlipidemia, unspecified: Secondary | ICD-10-CM

## 2016-08-30 DIAGNOSIS — I272 Other secondary pulmonary hypertension: Secondary | ICD-10-CM

## 2016-08-30 DIAGNOSIS — I2729 Other secondary pulmonary hypertension: Secondary | ICD-10-CM

## 2016-08-30 MED ORDER — HYDROCODONE-ACETAMINOPHEN 10-325 MG PO TABS: 1.0000 | ORAL_TABLET | Freq: Four times a day (QID) | ORAL | 0 refills | Status: DC | PRN

## 2016-08-30 MED ORDER — HYDROCODONE-ACETAMINOPHEN 10-325 MG PO TABS: 1.0000 | ORAL_TABLET | Freq: Three times a day (TID) | ORAL | 0 refills | Status: DC | PRN

## 2016-08-30 MED ORDER — ZOLPIDEM TARTRATE 5 MG PO TABS: 5.0000 mg | ORAL_TABLET | Freq: Every day | ORAL | 5 refills | Status: DC

## 2016-08-30 NOTE — Patient Instructions (Signed)

## 2016-09-01 DIAGNOSIS — G4700 Insomnia, unspecified: Secondary | ICD-10-CM | POA: Insufficient documentation

## 2016-09-01 DIAGNOSIS — F32A Depression, unspecified: Secondary | ICD-10-CM | POA: Insufficient documentation

## 2016-09-01 DIAGNOSIS — F329 Major depressive disorder, single episode, unspecified: Secondary | ICD-10-CM | POA: Insufficient documentation

## 2016-09-01 DIAGNOSIS — E785 Hyperlipidemia, unspecified: Secondary | ICD-10-CM | POA: Insufficient documentation

## 2016-09-01 DIAGNOSIS — M5136 Other intervertebral disc degeneration, lumbar region: Secondary | ICD-10-CM | POA: Insufficient documentation

## 2016-09-01 DIAGNOSIS — D869 Sarcoidosis, unspecified: Secondary | ICD-10-CM | POA: Insufficient documentation

## 2016-09-01 DIAGNOSIS — I2729 Other secondary pulmonary hypertension: Secondary | ICD-10-CM

## 2016-09-01 DIAGNOSIS — I509 Heart failure, unspecified: Secondary | ICD-10-CM | POA: Insufficient documentation

## 2016-09-01 NOTE — Progress Notes (Signed)
BP 101/64   Pulse 63   Temp (!) 96.9 F (36.1 C) (Oral)   Ht 5\' 7"  (1.702 m)   Wt 250 lb 12.8 oz (113.8 kg)   BMI 39.28 kg/m    Subjective:    Patient ID: Kelly Donaldson, female    DOB: [birth year], 60 y.o.   MRN: [id number]  Kelly Donaldson is a 60 y.o. female presenting on 08/30/2016 for Follow-up  HPI Patient here to be established as new patient at [facility].  This patient is known to me from [facility]. She has need for review of conditions and medications. She is still seeing her pulmonologist and cardiologist for her severe pulmonary hypertension.  Has known DDD and back pain with radiculopathy down leg.  She needs refills on meds. Discussed that at next visit we will prepare a narcotic contract in regards to her medication.     Relevant past medical, surgical, family and social history reviewed and updated as indicated. Interim medical history since our last visit reviewed. Allergies and medications reviewed and updated.   Data reviewed from any sources in EPIC.  Review of Systems  Constitutional: Positive for fatigue. Negative for activity change, fever and unexpected weight change.  HENT: Negative.   Eyes: Negative.   Respiratory: Positive for cough and shortness of breath.   Cardiovascular: Positive for leg swelling. Negative for chest pain and palpitations.  Gastrointestinal: Negative.  Negative for abdominal pain.  Endocrine: Negative.   Genitourinary: Negative.  Negative for dysuria.  Musculoskeletal: Positive for back pain and myalgias.  Skin: Negative.   Neurological: Negative.     Per HPI unless specifically indicated above  Social History   Social History  . Marital status: Married    Spouse name: N/A  . Number of children: N/A  . Years of education: N/A   Occupational History  . Unemployed    Social History Main Topics  . Smoking status: Former Smoker    Packs/day: 0.25    Years: 17.00    Types: Cigarettes   Quit date: 03/02/2015  . Smokeless tobacco: Never Used  . Alcohol use No  . 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 Sexual activity: Not on file   Other Topics Concern  . Not on file   Social History Narrative  . No narrative on file    Past Surgical History:  Procedure Laterality Date  . CARDIAC CATHETERIZATION N/A 04/20/2015   Procedure: Right Heart Cath;  Surgeon: Laurey Morale, MD;  Location: [facility] INVASIVE CV LAB;  Service: Cardiovascular;  Laterality: N/A;  . TEE WITHOUT CARDIOVERSION N/A 05/11/2015   Procedure: TRANSESOPHAGEAL ECHOCARDIOGRAM (TEE);  Surgeon: Laurey Morale, MD;  Location: [facility] ENDOSCOPY;  Service: Cardiovascular;  Laterality: N/A;  . TUBAL LIGATION     11/84    Family History  Problem Relation Age of Onset  . Hyperlipidemia Mother   . Hypertension Mother   . Diabetes Mother   . Other Mother     varicose veins  . Hyperlipidemia Father   . Hypertension Father   . Emphysema Father     smoked  . Hypertension Daughter   . Asthma Sister   . Clotting disorder Daughter   . Breast cancer Maternal Grandmother   . Ovarian cancer Maternal Aunt       Medication List       Accurate as of 08/30/16 11:59 PM.  Always use your most recent med list.          aspirin EC 81 MG tablet Take 1 tablet (81 mg total) by mouth daily.   cetirizine 10 MG tablet Commonly known as:  ZYRTEC Take 10 mg by mouth daily.   citalopram 40 MG tablet Commonly known as:  CELEXA Take 40 mg by mouth daily.   furosemide 40 MG tablet Commonly known as:  LASIX Take 40-80 mg by mouth as directed. Take 40mg  every other day alternating with 80mg  every other day   HYDROcodone-acetaminophen 10-325 MG tablet Commonly known as:  NORCO Take 1 tablet by mouth every 8 (eight) hours as needed.   HYDROcodone-acetaminophen 10-325 MG tablet Commonly known as:  NORCO Take 1 tablet by mouth every 8 (eight) hours as needed.   HYDROcodone-acetaminophen  10-325 MG tablet Commonly known as:  NORCO Take 1-2 tablets by mouth every 6 (six) hours as needed for moderate pain or severe pain.   Macitentan 10 MG Tabs Commonly known as:  OPSUMIT Take 1 tablet by mouth daily.   OXYGEN 2.5 lpm with sleep and 4 lpm with exertion  [facility]   potassium chloride SA 20 MEQ tablet Commonly known as:  K-DUR,KLOR-CON Take 1 tab daily every other day ALTERNATING with 1 tab Twice daily every other day   pravastatin 20 MG tablet Commonly known as:  PRAVACHOL Take 20 mg by mouth daily.   Riociguat 1 MG Tabs Commonly known as:  ADEMPAS Take 0.5 mg by mouth 3 (three) times daily.   Selexipag 200 MCG Tabs Take 1 tablet (200 mcg total) by mouth 2 (two) times daily.   Vitamin D (Ergocalciferol) 50000 units Caps capsule Commonly known as:  DRISDOL Take 50,000 Units by mouth every Monday.   zolpidem 5 MG tablet Commonly known as:  AMBIEN Take 1 tablet (5 mg total) by mouth at bedtime.          Objective:    BP 101/64   Pulse 63   Temp (!) 96.9 F (36.1 C) (Oral)   Ht 5\' 7"  (1.702 m)   Wt 250 lb 12.8 oz (113.8 kg)   BMI 39.28 kg/m   Allergies  Allergen Reactions  . Gabapentin     Pt states that she can not take with antidepressants  . Latex Rash   Wt Readings from Last 3 Encounters:  08/30/16 250 lb 12.8 oz (113.8 kg)  08/06/16 253 lb 12.8 oz (115.1 kg)  07/23/16 251 lb 8 oz (114.1 kg)    Physical Exam  Constitutional: She is oriented to person, place, and time. She appears well-developed and well-nourished.  HENT:  Head: Normocephalic and atraumatic.  Eyes: Conjunctivae and EOM are normal. Pupils are equal, round, and reactive to light.  Cardiovascular: Normal rate, regular rhythm, normal heart sounds and intact distal pulses.   Pulmonary/Chest: Effort normal. No accessory muscle usage. No respiratory distress. She has decreased breath sounds in the right lower field and the left lower field.  On high dose oxygen for pulmonary  hypertension  Abdominal: Soft. Bowel sounds are normal.  Musculoskeletal: She exhibits edema and deformity.  Neurological: She is alert and oriented to person, place, and time. She has normal reflexes.  Skin: Skin is warm and dry. No rash noted.  Psychiatric: She has a normal mood and affect. Her behavior is normal. Judgment and thought content normal.    Results for orders placed or performed in visit on 08/06/16  Hypersensitivity Pneumonitis  Result Value Ref Range  A.Fumigatus #1 Abs Negative Negative   Micropolyspora faeni, IgG Negative Negative   Thermoactinomyces vulgaris, IgG Negative Negative   A. Pullulans Abs Negative Negative   Thermoact. Saccharii Negative Negative   Pigeon Serum Abs Negative Negative      Assessment & Plan:   1. DDD (degenerative disc disease), lumbar - HYDROcodone-acetaminophen (NORCO) 10-325 MG tablet; Take 1 tablet by mouth every 8 (eight) hours as needed.  Dispense: 240 tablet; Refill: 0 - HYDROcodone-acetaminophen (NORCO) 10-325 MG tablet; Take 1 tablet by mouth every 8 (eight) hours as needed.  Dispense: 240 tablet; Refill: 0 - HYDROcodone-acetaminophen (NORCO) 10-325 MG tablet; Take 1-2 tablets by mouth every 6 (six) hours as needed for moderate pain or severe pain.  Dispense: 240 tablet; Refill: 0  2. ILD (interstitial lung disease) (HCC)  3. Chronic respiratory failure with hypoxia (HCC)  4. Chronic venous insufficiency  5. Pulmonary hypertension associated with sarcoidosis (HCC)  6. Depression  7. Insomnia  8. Hyperlipidemia    Continue all other maintenance medications as listed above. Educational handout given for pulmonary hypertension.  Follow up plan: Return in about 3 months (around 11/29/2016) for recheck.  Remus Loffler PA-C [facility] [address]  [place], [place] [address] [phone]   09/01/2016, 10:36 PM

## 2016-09-04 ENCOUNTER — Ambulatory Visit (INDEPENDENT_AMBULATORY_CARE_PROVIDER_SITE_OTHER): Payer: Self-pay | Admitting: Pulmonary Disease

## 2016-09-04 ENCOUNTER — Encounter: Payer: Self-pay | Admitting: Pulmonary Disease

## 2016-09-04 DIAGNOSIS — Z720 Tobacco use: Secondary | ICD-10-CM

## 2016-09-04 DIAGNOSIS — J849 Interstitial pulmonary disease, unspecified: Secondary | ICD-10-CM

## 2016-09-04 DIAGNOSIS — I272 Other secondary pulmonary hypertension: Secondary | ICD-10-CM

## 2016-09-04 DIAGNOSIS — J432 Centrilobular emphysema: Secondary | ICD-10-CM

## 2016-09-04 DIAGNOSIS — Z23 Encounter for immunization: Secondary | ICD-10-CM

## 2016-09-04 DIAGNOSIS — J9611 Chronic respiratory failure with hypoxia: Secondary | ICD-10-CM

## 2016-09-04 DIAGNOSIS — F17210 Nicotine dependence, cigarettes, uncomplicated: Secondary | ICD-10-CM

## 2016-09-04 DIAGNOSIS — I2721 Secondary pulmonary arterial hypertension: Secondary | ICD-10-CM

## 2016-09-04 MED ORDER — IPRATROPIUM-ALBUTEROL 20-100 MCG/ACT IN AERS: 1.0000 | INHALATION_SPRAY | Freq: Four times a day (QID) | RESPIRATORY_TRACT | 5 refills | Status: DC | PRN

## 2016-09-04 NOTE — Addendum Note (Signed)
Addended by: Sheran Luz on: 09/04/2016 12:51 PM   Modules accepted: Orders

## 2016-09-04 NOTE — Assessment & Plan Note (Signed)
Continue 3 L of oxygen at rest, 6 with exertion

## 2016-09-04 NOTE — Assessment & Plan Note (Signed)
She has a poorly understood interstitial lung disease with centrilobular emphysema.  This time this does not fit a specific pattern. The only way to know for certain what is causing this would be to perform an open lung biopsy but that would be lethal considering her pulmonary hypertension.  Plan: Continue monitoring with annual CT scans Continue monitoring with annual pulmonary function testing If evidence of decline or change in the interstitial lung disease been consider empiric treatment for usual interstitial pneumonitis, I don't think that's necessary at this time as the pattern does not fit that diagnosis.

## 2016-09-04 NOTE — Assessment & Plan Note (Signed)
This is a complicated problem. This lady has had best World Health Organization group 3 disease with perhaps some group 1 disease. Exline As I understand her better, particularly from seeing her CT scan in talking to her about her smoking history, we can see that she has both centrilobular emphysema and a pulmonary fibrosis which is poorly understood. This may be enough to explain the exceedingly high value in her pulmonary artery pressure.  Her hypoxemia is complicated by right to left shunt as well.  Plan: Continue 3 drug therapy, okay to try to increase the dose of Adempas as tolerated but we will need to monitor her blood pressure with this. We will recheck out to the home health nurse to coordinate the dosing of Adempas We will schedule a 6 minute walk on the next visit to start using this as an endpoint for treatment efficacy

## 2016-09-04 NOTE — Patient Instructions (Signed)
Use Combivent as needed for shortness of breath We will ask the Adempas nurse to escalate the dose Continue oxygen as you're doing We'll see you back in 3 months with a 6 minute walk

## 2016-09-04 NOTE — Progress Notes (Signed)
Subjective:    Patient ID: Kelly Donaldson, female    DOB: 1955-12-25, 60 y.o.   MRN: [id number]  Synopsis: First seen by [facility] in 2017 for evaluation of dyspnea in the setting of pulmonary hypertension. Has a nondescript interstitial lung disease.  Smoked from age [age] 1ppd with one 10 year break  CT chest images April 2016  showing a fibrotic process in the upper lobes extending into the periphery of the lower lobes, certainly worse in the upper lobes and the lower lobes are appears to be groundglass in the upper lobes. There is no pulmonary embolism  Bubble echo 04/13/15 with R to L shunt RHC 04/16/15 RHC:  Hemodynamics RA mean 18 RV 81/18 PA 83/33, mean 53 PCWP mean 19 Oxygen saturations: PA 63% AO 98% Cardiac Output (Fick) 5.82  Cardiac Index (Fick) 2.61 PVR 5.8 WU Rec:  Lasix/ masitentan   08/2016 high-resolution CT scan of the chest: [facility]addy review: Significant upper lobe centrilobular emphysema .  Peripheral based interlobular septal thickening and groundglass consistent with fibrosis with some mild bronchiectasis, fibrotic changes appear to be more prominent in the lower lobes with upper lobe nodules, no air trapping noted on expiratory images, mediastinal lymphadenopathy stable per radiology    HPI  Chief Complaint  Patient presents with  . Follow-up    review CT and labs.  pt denies any breathing complaints today.     She still has some shortness of breath. She is taking Selexipag, Opsumit, and Riociguat.  Apparently Revatio and Adempas caused side effects. No much sleg swelling  Some cough last night, first time in a while.   Past Medical History:  Diagnosis Date  . Allergy   . Depression   . GERD (gastroesophageal reflux disease)   . Hyperlipidemia   . Oxygen dependent   . Shortness of breath dyspnea   . Varicose veins      Review of Systems  Constitutional: Negative for chills, fatigue and fever.  HENT: Negative for postnasal drip, rhinorrhea and  sinus pressure.   Respiratory: Positive for shortness of breath. Negative for cough and wheezing.        Objective:   Physical Exam  Vitals:   09/04/16 1144  BP: (!) 114/58  Pulse: 71  SpO2: 93%  Weight: 251 lb 3.2 oz (113.9 kg)  Height: 5\' 7"  (1.702 m)   3L [place]  Gen: well appearing HENT: OP clear, TM's clear, neck supple PULM: Crackles bases B, normal percussion CV: RRR, systolic murmur, trace edema GI: BS+, soft, nontender Derm: no cyanosis or rash Psyche: normal mood and affect  CT chest images personally reviewed, see my summary above  CBC    Component Value Date/Time   WBC 7.8 12/08/2015 1215   RBC 3.76 (L) 12/08/2015 1215   HGB 9.9 (L) 12/08/2015 1215   HCT 31.6 (L) 12/08/2015 1215   PLT 184 12/08/2015 1215   MCV 84.0 12/08/2015 1215   MCH 26.3 12/08/2015 1215   MCHC 31.3 12/08/2015 1215   RDW 14.5 12/08/2015 1215   LYMPHSABS 2.4 03/30/2015 1610   MONOABS 0.4 03/30/2015 1610   EOSABS 0.2 03/30/2015 1610   BASOSABS 0.0 03/30/2015 1610        Assessment & Plan:  Cigarette smoker Former smoker, consider enrollment in low-dose lung cancer screening program.  Chronic respiratory failure with hypoxia (HCC) Continue 3 L of oxygen at rest, 6 with exertion  PAH (pulmonary artery hypertension) (HCC) This is a complicated problem. This lady has had best  World Health Organization group 3 disease with perhaps some group 1 disease. Exline As I understand her better, particularly from seeing her CT scan in talking to her about her smoking history, we can see that she has both centrilobular emphysema and a pulmonary fibrosis which is poorly understood. This may be enough to explain the exceedingly high value in her pulmonary artery pressure.  Her hypoxemia is complicated by right to left shunt as well.  Plan: Continue 3 drug therapy, okay to try to increase the dose of Adempas as tolerated but we will need to monitor her blood pressure with this. We will recheck  out to the home health nurse to coordinate the dosing of Adempas We will schedule a 6 minute walk on the next visit to start using this as an endpoint for treatment efficacy  ILD (interstitial lung disease) (HCC) She has a poorly understood interstitial lung disease with centrilobular emphysema.  This time this does not fit a specific pattern. The only way to know for certain what is causing this would be to perform an open lung biopsy but that would be lethal considering her pulmonary hypertension.  Plan: Continue monitoring with annual CT scans Continue monitoring with annual pulmonary function testing If evidence of decline or change in the interstitial lung disease been consider empiric treatment for usual interstitial pneumonitis, I don't think that's necessary at this time as the pattern does not fit that diagnosis.  Centrilobular emphysema (HCC) Flu shot Start combivent prn    Current Outpatient Prescriptions:  .  aspirin EC 81 MG tablet, Take 1 tablet (81 mg total) by mouth daily., Disp: 90 tablet, Rfl: 3 .  cetirizine (ZYRTEC) 10 MG tablet, Take 10 mg by mouth daily., Disp: , Rfl:  .  citalopram (CELEXA) 40 MG tablet, Take 40 mg by mouth daily., Disp: , Rfl:  .  furosemide (LASIX) 40 MG tablet, Take 40-80 mg by mouth as directed. Take 40mg  every other day alternating with 80mg  every other day, Disp: , Rfl:  .  HYDROcodone-acetaminophen (NORCO) 10-325 MG tablet, Take 1 tablet by mouth every 8 (eight) hours as needed., Disp: 240 tablet, Rfl: 0 .  HYDROcodone-acetaminophen (NORCO) 10-325 MG tablet, Take 1 tablet by mouth every 8 (eight) hours as needed., Disp: 240 tablet, Rfl: 0 .  HYDROcodone-acetaminophen (NORCO) 10-325 MG tablet, Take 1-2 tablets by mouth every 6 (six) hours as needed for moderate pain or severe pain., Disp: 240 tablet, Rfl: 0 .  Macitentan (OPSUMIT) 10 MG TABS, Take 1 tablet by mouth daily., Disp: 90 tablet, Rfl: 3 .  OXYGEN, 2.5 lpm with sleep and 4 lpm with  exertion  [facility], Disp: , Rfl:  .  potassium chloride SA (K-DUR,KLOR-CON) 20 MEQ tablet, Take 1 tab daily every other day ALTERNATING with 1 tab Twice daily every other day, Disp: 60 tablet, Rfl: 6 .  pravastatin (PRAVACHOL) 20 MG tablet, Take 20 mg by mouth daily., Disp: , Rfl:  .  Riociguat (ADEMPAS) 1 MG TABS, Take 0.5 mg by mouth 3 (three) times daily., Disp: 90 tablet, Rfl:  .  Selexipag 200 MCG TABS, Take 1 tablet (200 mcg total) by mouth 2 (two) times daily., Disp: 60 tablet, Rfl: 6 .  Vitamin D, Ergocalciferol, (DRISDOL) 50000 UNITS CAPS capsule, Take 50,000 Units by mouth every Monday. , Disp: , Rfl:  .  zolpidem (AMBIEN) 5 MG tablet, Take 1 tablet (5 mg total) by mouth at bedtime., Disp: 30 tablet, Rfl: 5

## 2016-09-04 NOTE — Assessment & Plan Note (Signed)
Flu shot Start combivent prn

## 2016-09-04 NOTE — Assessment & Plan Note (Signed)
Former smoker, consider enrollment in low-dose lung cancer screening program.

## 2016-09-05 ENCOUNTER — Telehealth: Payer: Self-pay | Admitting: Pulmonary Disease

## 2016-09-05 NOTE — Telephone Encounter (Signed)
Per 09/04/16 OV: Use Combivent as needed for shortness of breath We will ask the Adempas nurse to escalate the dose Continue oxygen as you're doing We'll see you back in 3 months with a 6 minute walk ---  Called spoke with pt. She reports the combivent is too expensive for her. She is needing something to replace this. Pt was not given any alternatives. Please advise Dr. [name] thanks

## 2016-09-05 NOTE — Telephone Encounter (Signed)
duoneb qid prn dyspnea

## 2016-09-05 NOTE — Telephone Encounter (Signed)
LMOMTCB x 1 

## 2016-09-06 MED ORDER — IPRATROPIUM-ALBUTEROL 0.5-2.5 (3) MG/3ML IN SOLN: 3.0000 mL | Freq: Four times a day (QID) | RESPIRATORY_TRACT | 11 refills | Status: DC | PRN

## 2016-09-06 NOTE — Telephone Encounter (Signed)
Spoke with pt. She is aware of BQ's recommendation. Rx has been sent in. Pt already has a nebulizer machine. Nothing further was needed.

## 2016-09-20 ENCOUNTER — Ambulatory Visit (HOSPITAL_COMMUNITY)
Admission: RE | Admit: 2016-09-20 | Discharge: 2016-09-20 | Disposition: A | Discharge status: Home / Self Care | Payer: Self-pay | Source: Ambulatory Visit | Attending: Cardiology | Admitting: Cardiology

## 2016-09-20 VITALS — BP 112/68 | HR 71 | Wt 255.8 lb

## 2016-09-20 DIAGNOSIS — J9611 Chronic respiratory failure with hypoxia: Secondary | ICD-10-CM | POA: Insufficient documentation

## 2016-09-20 DIAGNOSIS — K219 Gastro-esophageal reflux disease without esophagitis: Secondary | ICD-10-CM | POA: Insufficient documentation

## 2016-09-20 DIAGNOSIS — I5032 Chronic diastolic (congestive) heart failure: Secondary | ICD-10-CM

## 2016-09-20 DIAGNOSIS — E785 Hyperlipidemia, unspecified: Secondary | ICD-10-CM | POA: Insufficient documentation

## 2016-09-20 DIAGNOSIS — I2721 Secondary pulmonary arterial hypertension: Secondary | ICD-10-CM | POA: Insufficient documentation

## 2016-09-20 DIAGNOSIS — R0902 Hypoxemia: Secondary | ICD-10-CM | POA: Insufficient documentation

## 2016-09-20 DIAGNOSIS — Z87891 Personal history of nicotine dependence: Secondary | ICD-10-CM | POA: Insufficient documentation

## 2016-09-20 LAB — BASIC METABOLIC PANEL
Anion gap: 7 (ref 5–15)
BUN: 13 mg/dL (ref 6–20)
CO2: 27 mmol/L (ref 22–32)
CREATININE: 0.98 mg/dL (ref 0.44–1.00)
Calcium: 8.8 mg/dL — ABNORMAL LOW (ref 8.9–10.3)
Chloride: 105 mmol/L (ref 101–111)
GFR calc Af Amer: >60 mL/min (ref >60)
GLUCOSE: 101 mg/dL — AB (ref 65–99)
POTASSIUM: 4.2 mmol/L (ref 3.5–5.1)
Sodium: 139 mmol/L (ref 135–145)

## 2016-09-20 LAB — BRAIN NATRIURETIC PEPTIDE: B Natriuretic Peptide: 42.7 pg/mL (ref 0.0–100.0)

## 2016-09-20 IMAGING — CT CT ANGIO CHEST
1 of 6 series · 5 of 36 positions shown · IV contrast (Omnipaque 300)
Comparison: Chest x-ray dated 03/30/2015 and CT angiogram of the
chest dated 10/12/2012

CLINICAL DATA: Chest pain and shortness of breath.

EXAM:
CT ANGIOGRAPHY CHEST WITH CONTRAST
TECHNIQUE: Multidetector CT imaging of the chest was performed using the
standard protocol during bolus administration of intravenous
contrast. Multiplanar CT image reconstructions and MIPs were
obtained to evaluate the vascular anatomy.
CONTRAST:  100mL OMNIPAQUE IOHEXOL 350 MG/ML SOLN

[Series 4: pe 3.0 b40f · axial · 0.63mm/px · z∈[+850,+1016]mm · 5 of 83 slices shown]
[im 14/83  lung]
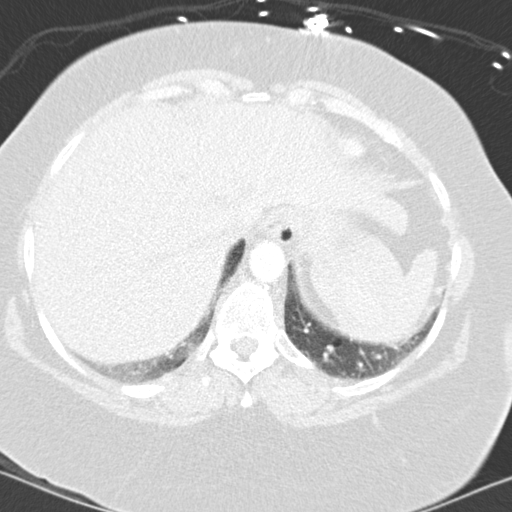
[im 28/83  mediastinal]
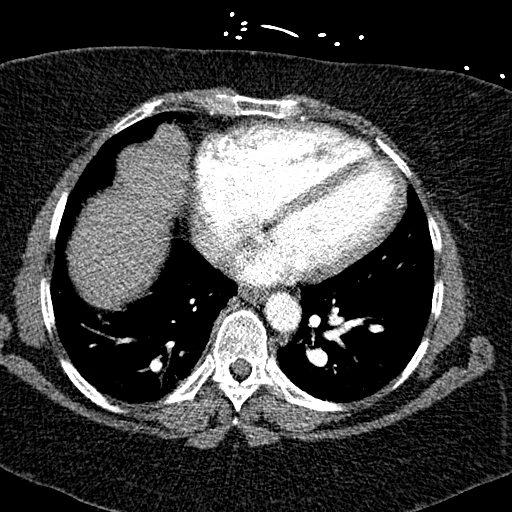
[im 42/83  lung]
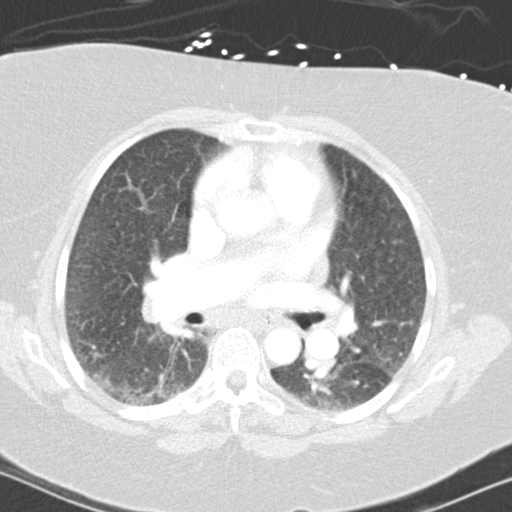
[im 55/83  mediastinal]
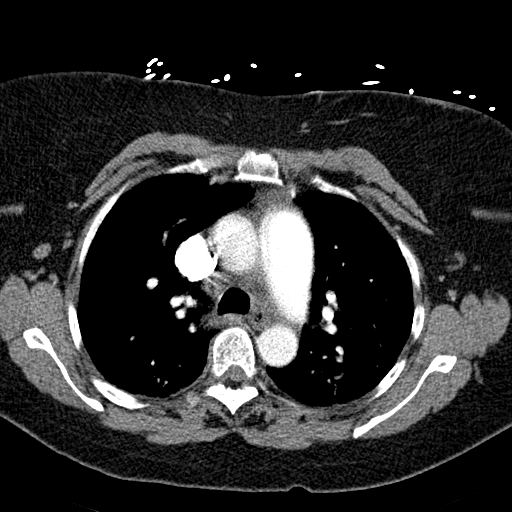
[im 69/83  lung]
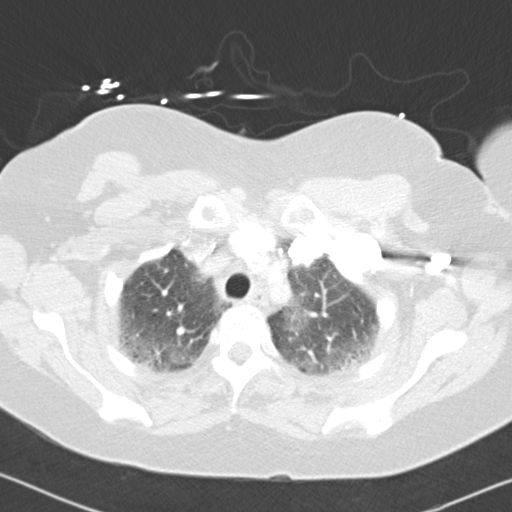

[5 of 36 positions shown; findings below may reference images not displayed]

FINDINGS: There are no pulmonary emboli. The patient has chronic interstitial
and obstructive lung disease. There small mediastinal and hilar
lymph nodes which are slightly more prominent than on the prior
study. There is a new 2.5 x 1.7 cm lymph node with a calcified
center in the right axilla. There is chronic right peritracheal
adenopathy best seen on image 9 of series 4, measuring 18 x 18 mm,
essentially unchanged since 4991. The visualized portion of the
upper abdomen is normal. No significant osseous abnormality.

Borderline cardiomegaly.  No effusions.

Review of the MIP images confirms the above findings.
IMPRESSION: 1. No pulmonary emboli.
2. Slightly increased slight mediastinal and hilar adenopathy with a
new enlarged lymph node in the right axilla.
3. Chronic interstitial and obstructive lung disease.
4. The possibility of sarcoidosis should be considered.

## 2016-09-20 MED ORDER — FUROSEMIDE 80 MG PO TABS: 80.0000 mg | ORAL_TABLET | Freq: Two times a day (BID) | ORAL | 6 refills | Status: DC

## 2016-09-20 MED ORDER — POTASSIUM CHLORIDE CRYS ER 20 MEQ PO TBCR: 20.0000 meq | EXTENDED_RELEASE_TABLET | Freq: Two times a day (BID) | ORAL | 6 refills | Status: DC

## 2016-09-20 NOTE — Patient Instructions (Signed)
Increase Furosemide to 80 mg Twice daily   Increase Potassium 20 meq Twice daily   Continue Adempas 1 mg Three times a day   Labs today  Labs in 2 weeks  Your physician recommends that you schedule a follow-up appointment in: 1 month

## 2016-09-20 NOTE — Progress Notes (Signed)
Patient ID: Kelly Donaldson, female   DOB: Dec 29, 1955, 60 y.o.   MRN: [id number]   PCP: Prudy Feeler Pulmonology: Dr [name] Primary HF: Dr Shirlee Latch   60 yo with history of chronic hypoxemic respiratory failure and pulmonary hypertension.  She has been followed by Dr [name] with pulmonology.  She says that she has been on home oxygen for 6 years.  She quit smoking in 2016.  She had an echo done in 5/16 showing dilated RV with severe pulmonary hypertension.  She therefore had RHC in 5/16 which confirmed severe pulmonary hypertension and RV failure.  PFTs showed only mild obstruction and restriction with markedly decreased DLCO suggestive of pulmonary vascular disease.  There was concern for sarcoidosis on CT chest but lymph node biopsy showed no evidence for sarcoidosis.  V/Q scan showed no evidence for chronic PE.   She has not tolerated Revatio or Adcirca.  Have failed previous up-titration of Selexipag with pelvic pain, can tolerate 200 mcg bid.  She is now tolerating riociguat 1 mg tid.   She presents today for regular follow up.  Has felt bad past 4 days with cough and congestion.  Denies fevers or chills, hasn't tried any OTC meds. More SOB with cough.  SOB after about 30-50 yards. Can walk further when she is feeling well. Walks grocery store, not using cart. Denies lightheadedness or dizziness. No CP.  Energy level OK. Has generalized fatigue. She saw Dr [name] recently. Went up on Adempas to 1 mg TID.  Weight up 4 lbs since last visit.   6 minute walk (5/16): 238 meters => decreased oxygen saturation to 73%, increased to 6 L [place] 6 minute walk (7/16): 170 meters => unable to complete.  6 minute walk (9/16): 201 meters 6 minute walk (1/17): 256 meters 6 minute walk (4/17): 219 meters 6 minute walk (8/17): 225 meters  Labs (2/16): BNP 69 Labs (5/16): LFTs normal, HCT 34.9, RF negative, HIV negative, TSH negative Labs (6/16): K 4.4, creatinine 0.88 Labs (7/16): ANA negative, BNP 58, K 4.3, creatinine  1.1, anti-RNP negative Labs (9/16): K 4.4, creatinine 1.26, BNP 91 Labs (10/16): K 4.3, creatinine 1.04 Labs (12/16): K 4.2, creatinine 1.07, BNP 130 Labs (1/17): K 4.3, creatinine 1.23, BNP 42 Labs (3/17): K 4.2, creatinine 1.19 Labs (5/17): K 4.3, creatinine 1.35, BNP 44 Labs (6/17): K 4.4, creatinine 1.18, BNP 44 Labs (10/17): K 4.2, creatinine 0.98, BNP 43  PMH: 1. Pulmonary hypertension: RHC (5/16) with mean RA 18, PA 83/33 mean 53, mean PCWP 19, CI 2.61, PVR 5.8 WU.   Echo (5/16) with EF 65-70%, septal flattening, dilated RV with PA systolic pressure 99 mmHg, +bubble study.  PFTs (4/16) with FVC 76%, FEV1 75%, ratio 97%, TLC 66%, DLCO 26% => mild restriction, mild obstruction, severe diffuse abnormality (pulmonary vascular problem).  CTA chest (4/16) with chronic interstitial and obstructive lung disease, small mediastinal and hilar lymph noes, no PE.  Lymph node biopsy negative for sarcoidosis (reactive changes).  V/Q scan (6/16): No acute or chronic PE. TEE (6/16): Normal LV size and systolic function, EF 55-60%,RV was mildly dilated with mildly decreased systolic function, D-shaped interventricular septum suggestive of RV pressure/volume overload, there appeared to be a small PFO present with some bubbles crossing, no ASD.  ANA negative, anti-RNP negative, HIV negative. Sleep study (8/16) without OSA.  She did not tolerate Adcirca or Revatio.  She cannot tolerate more than 200 mcg bid Selexipag.  - Echo (6/17): EF 60-65%, normal diastolic function, D-shaped  interventricular septum, mildly dilated RV with normal RV systolic function, PASP 39, IVC normal.  2. Chronic venous insufficiency. 3. Hyperlipidemia.  4. GERD 5. Chronic hypoxemic respiratory failure: Hypoxemia noted x 6 years at least.  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   SH: Married, quit smoking in 3/16.  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    FH: No pulmonary hypertension, no  premature CAD, no sudden death.   ROS: All systems reviewed and negative except as per HPI.    Current Outpatient Prescriptions  Medication Sig Dispense Refill  . aspirin EC 81 MG tablet Take 1 tablet (81 mg total) by mouth daily. 90 tablet 3  . cetirizine (ZYRTEC) 10 MG tablet Take 10 mg by mouth daily.    . citalopram (CELEXA) 40 MG tablet Take 40 mg by mouth daily.    . furosemide (LASIX) 40 MG tablet Take 40-80 mg by mouth as directed. Take 40mg  every other day alternating with 80mg  every other day    . HYDROcodone-acetaminophen (NORCO) 10-325 MG tablet Take 1 tablet by mouth every 8 (eight) hours as needed. 240 tablet 0  . Ipratropium-Albuterol (COMBIVENT RESPIMAT) 20-100 MCG/ACT AERS respimat Inhale 1 puff into the lungs every 6 (six) hours as needed for wheezing. 1 Inhaler 5  . ipratropium-albuterol (DUONEB) 0.5-2.5 (3) MG/3ML SOLN Take 3 mLs by nebulization 4 (four) times daily as needed. 360 mL 11  . Macitentan (OPSUMIT) 10 MG TABS Take 1 tablet by mouth daily. 90 tablet 3  . OXYGEN 2.5 lpm with sleep and 4 lpm with exertion  [facility]    . potassium chloride SA (K-DUR,KLOR-CON) 20 MEQ tablet Take 1 tab daily every other day ALTERNATING with 1 tab Twice daily every other day 60 tablet 6  . pravastatin (PRAVACHOL) 20 MG tablet Take 20 mg by mouth daily.    . Riociguat (ADEMPAS) 1 MG TABS Take 1 mg by mouth 3 (three) times daily.    . Selexipag 200 MCG TABS Take 1 tablet (200 mcg total) by mouth 2 (two) times daily. 60 tablet 6  . Vitamin D, Ergocalciferol, (DRISDOL) 50000 UNITS CAPS capsule Take 50,000 Units by mouth every Monday.     . zolpidem (AMBIEN) 5 MG tablet Take 1 tablet (5 mg total) by mouth at bedtime. 30 tablet 5   No current facility-administered medications for this encounter.    BP 112/68 (BP Location: Left Arm, Patient Position: Sitting)   Pulse 71   Wt 255 lb 12.8 oz (116 kg)   SpO2 98% Comment: on 4L  BMI 40.06 kg/m   Wt Readings from Last 3 Encounters:    09/20/16 255 lb 12.8 oz (116 kg)  09/04/16 251 lb 3.2 oz (113.9 kg)  08/30/16 250 lb 12.8 oz (113.8 kg)     General: NAD.  Neck: JVP 8-9 cm, no thyromegaly or thyroid nodule. No carotid bruit. Lungs: Diminished throughout.  CV: Nondisplaced PMI.  Heart regular S1/S2, no S3, 2/6 HSM LLSB.      Abdomen: Soft, NT, ND, no HSM. No bruits or masses. +BS  Skin: Intact without lesions or rashes.  Neurologic: Alert and oriented x 3.  Psych: Normal affect. Extremities: No clubbing or cyanosis. 1+ ankle edema. Normal pedal pulses.  HEENT: Normal.   Assessment/Plan:  1. Pulmonary arterial hypertension with RV failure: Severe on prior RHC. 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  There  was concern from CTA chest for sarcoidosis, but lymph node biopsy was negative.  LFTs normal, ANA negative, anti-RNP negative, HIV negative.  V/Q scan negative for chronic PE.  Small PFO but no ASD on TEE.  No OSA on sleep study.  She did not tolerate Revatio due to joint pain (now resolved), and she did not tolerate Adcirca with worsening dyspnea.  She does not tolerate more than 200 mcg bid Selexipag due to pelvic pain.  She has been able to increase riociguat to 1 tid.  Echo 6/17 showed mildly dilated RV with normal systolic function and PASP appeared lower at 39 mmHg. Echo has looked better with PH treatment, but her symptoms and 6 minute walk have been fairly unchanged.  She is volume overloaded on exam and weight is up.   - Open lung biopsy deemed too risky, probably not sarcoid.   - Increase lasix to 80 mg bid.  Increase potassium to 40 daily. BMET/BNP today. Repeat BMET in 2 weeks. - Continue Opsumit 10 mg daily. - Continue Selexipag at 200 mcg BID and do not titrate up given lack of tolerance. - Continue riociguat 1 mg tid, will continue to try to titrate up.  - Last echo looked better but symptoms and 6 min walk basically  the same. Does not want to today because feels bad.  Will plan for 6 minute walk next visit with recent increase in riociguat.   2. Chronic hypoxemic respiratory failure: Chronic interstitial and obstructive lung disease, mediastinal and hilar lymphadenopathy.  Concern for sarcoidosis.  PFTs showed only mild restriction and obstruction.  Biopsy did not show sarcoid.  Deemed too high risk for open lung biopsy.  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 - Continue oxygen up to 5 L with activity, 3L at rest.  - Sees pulmonary, Dr [name].  Follow up 1 month to reassess fluid status and to monitor with increase in riociguat.  Graciella Freer, PA-C 09/20/2016   Patient seen with PA, agree with the above note.  She is volume overloaded on exam today and feels worse. Will increase Lasix to 80 mg bid with KCl 40, BMET today and again in 2 wks.  Continue titration of riociguat.  Followup in 1 month.   [name] 09/22/2016

## 2016-09-25 ENCOUNTER — Telehealth (HOSPITAL_COMMUNITY): Payer: Self-pay | Admitting: *Deleted

## 2016-09-25 NOTE — Telephone Encounter (Signed)
Aggie Cosier, RN called to verify pt's Adempas dose and titration.  Per last OV note pt on 1 mg Three times a day titrate as tolerated.  She will put in VO and work with pt for further titration

## 2016-09-27 ENCOUNTER — Telehealth: Payer: Self-pay | Admitting: Physician Assistant

## 2016-09-30 NOTE — Telephone Encounter (Signed)
Patient aware of recommendation.  

## 2016-09-30 NOTE — Telephone Encounter (Signed)
She can use Mucinex over-the-counter, Delsym cough syrup over-the-counter 2-3 teaspoons twice daily for the cough.

## 2016-10-09 ENCOUNTER — Telehealth: Payer: Self-pay | Admitting: Internal Medicine

## 2016-10-09 NOTE — Telephone Encounter (Signed)
Yes no changes.  This is fairly baseline for her.

## 2016-10-09 NOTE — Telephone Encounter (Signed)
New message  Marchelle Folks is calling from [facility]  Has question about patient's meds  Having bp issues  Please return her call

## 2016-10-09 NOTE — Telephone Encounter (Signed)
Marchelle Folks calling to make Dr Shirlee Latch aware pt's BP today was 98/60.  She has been out of medication recently and was restarted on Adempas 1 mg TID on 10/18.  There was one day the pt took 4 tablets in a day per report.  Pt is also on Furosemide 80 mg BID as well as Opsmit 10 mg a day.  Marchelle Folks would like to know if pt should continue on these as listed.  Advised I will forward information to [facility] for review and someone from that office will be calling her back.

## 2016-10-10 NOTE — Telephone Encounter (Signed)
Spoke w/Amanda, she states pt is non-compliant with meds, she was out a while back then took double dose one day.  Marchelle Folks states she has reiterated many times about compliance, reporting symptoms and ordering in time.  She is not comfortable increasing pt's dose at this time.  She recommends keeping pt on 1 mg TID and reassessing in a month, can increase at that time if pt is being more compliant

## 2016-10-22 ENCOUNTER — Ambulatory Visit (HOSPITAL_COMMUNITY)
Admission: RE | Admit: 2016-10-22 | Discharge: 2016-10-22 | Disposition: A | Discharge status: Home / Self Care | Payer: Self-pay | Source: Ambulatory Visit | Attending: Cardiology | Admitting: Cardiology

## 2016-10-22 VITALS — BP 132/60 | Wt 258.0 lb

## 2016-10-22 DIAGNOSIS — K219 Gastro-esophageal reflux disease without esophagitis: Secondary | ICD-10-CM | POA: Insufficient documentation

## 2016-10-22 DIAGNOSIS — R590 Localized enlarged lymph nodes: Secondary | ICD-10-CM | POA: Insufficient documentation

## 2016-10-22 DIAGNOSIS — Z7982 Long term (current) use of aspirin: Secondary | ICD-10-CM | POA: Insufficient documentation

## 2016-10-22 DIAGNOSIS — E785 Hyperlipidemia, unspecified: Secondary | ICD-10-CM | POA: Insufficient documentation

## 2016-10-22 DIAGNOSIS — J449 Chronic obstructive pulmonary disease, unspecified: Secondary | ICD-10-CM | POA: Insufficient documentation

## 2016-10-22 DIAGNOSIS — Z87891 Personal history of nicotine dependence: Secondary | ICD-10-CM | POA: Insufficient documentation

## 2016-10-22 DIAGNOSIS — I5032 Chronic diastolic (congestive) heart failure: Secondary | ICD-10-CM

## 2016-10-22 DIAGNOSIS — D869 Sarcoidosis, unspecified: Secondary | ICD-10-CM | POA: Insufficient documentation

## 2016-10-22 DIAGNOSIS — I2729 Other secondary pulmonary hypertension: Secondary | ICD-10-CM

## 2016-10-22 DIAGNOSIS — J9611 Chronic respiratory failure with hypoxia: Secondary | ICD-10-CM | POA: Insufficient documentation

## 2016-10-22 DIAGNOSIS — I2721 Secondary pulmonary arterial hypertension: Secondary | ICD-10-CM | POA: Insufficient documentation

## 2016-10-22 DIAGNOSIS — I872 Venous insufficiency (chronic) (peripheral): Secondary | ICD-10-CM | POA: Insufficient documentation

## 2016-10-22 DIAGNOSIS — J84112 Idiopathic pulmonary fibrosis: Secondary | ICD-10-CM | POA: Insufficient documentation

## 2016-10-22 LAB — BASIC METABOLIC PANEL
Anion gap: 8 (ref 5–15)
BUN: 13 mg/dL (ref 6–20)
CHLORIDE: 103 mmol/L (ref 101–111)
CO2: 27 mmol/L (ref 22–32)
Calcium: 8.8 mg/dL — ABNORMAL LOW (ref 8.9–10.3)
Creatinine, Ser: 1.1 mg/dL — ABNORMAL HIGH (ref 0.44–1.00)
GFR calc non Af Amer: 53 mL/min — ABNORMAL LOW (ref >60)
Glucose, Bld: 163 mg/dL — ABNORMAL HIGH (ref 65–99)
POTASSIUM: 4.6 mmol/L (ref 3.5–5.1)
SODIUM: 138 mmol/L (ref 135–145)

## 2016-10-22 LAB — BRAIN NATRIURETIC PEPTIDE: B NATRIURETIC PEPTIDE 5: 54.2 pg/mL (ref 0.0–100.0)

## 2016-10-22 MED ORDER — TORSEMIDE 20 MG PO TABS: 80.0000 mg | ORAL_TABLET | Freq: Every day | ORAL | 3 refills | Status: DC

## 2016-10-22 NOTE — Progress Notes (Signed)
6 min walk test completed, pt ambulated 800 ft (243.8 m) on 4 L O2 via [place].  Pt did took 4 rest breaks during test.

## 2016-10-22 NOTE — Patient Instructions (Signed)
Stop taking Furosamide   Start Torsemide 80 mg (4 Tabs) Once Daily  Labs today (will call for abnormal results, otherwise no news is good news)  Labs in 10 days  Follow up in 1 month

## 2016-10-23 NOTE — Progress Notes (Signed)
Patient ID: Kelly Donaldson, female   DOB: Jul 20, 1956, 60 y.o.   MRN: [id number]   PCP: Prudy Feeler Pulmonology: Dr [name] Primary HF: Dr Shirlee Latch   60 yo with history of chronic hypoxemic respiratory failure and pulmonary hypertension.  She has been followed by Dr [name] with pulmonology.  She says that she has been on home oxygen for 6 years.  She quit smoking in 2016.  She had an echo done in 5/16 showing dilated RV with severe pulmonary hypertension.  She therefore had RHC in 5/16 which confirmed severe pulmonary hypertension and RV failure.  PFTs showed only mild obstruction and restriction with markedly decreased DLCO suggestive of pulmonary vascular disease.  There was concern for sarcoidosis on CT chest but lymph node biopsy showed no evidence for sarcoidosis.  V/Q scan showed no evidence for chronic PE.   She has not tolerated Revatio or Adcirca.  Have failed previous up-titration of Selexipag with pelvic pain, can tolerate 200 mcg bid.  She is now tolerating riociguat 1 mg tid.   She presents today for regular follow up.  She apparently has not been very compliant with riociguat so dose has not been increased yet.  Now trying to do better.  She does not feel like her breathing is better with ricociguat.  She is still short of breath after walking about 50-75 yards.  She continues on home oxygen.  Trying ot watch sodium.  Weight is up 3 lbs.  She has not been completely compliant with Lasix either.   6 minute walk (5/16): 238 meters => decreased oxygen saturation to 73%, increased to 6 L [place] 6 minute walk (7/16): 170 meters => unable to complete.  6 minute walk (9/16): 201 meters 6 minute walk (1/17): 256 meters 6 minute walk (4/17): 219 meters 6 minute walk (8/17): 225 meters 6 minute walk (11/17): 244 meters  Labs (2/16): BNP 69 Labs (5/16): LFTs normal, HCT 34.9, RF negative, HIV negative, TSH negative Labs (6/16): K 4.4, creatinine 0.88 Labs (7/16): ANA negative, BNP 58, K 4.3, creatinine  1.1, anti-RNP negative Labs (9/16): K 4.4, creatinine 1.26, BNP 91 Labs (10/16): K 4.3, creatinine 1.04 Labs (12/16): K 4.2, creatinine 1.07, BNP 130 Labs (1/17): K 4.3, creatinine 1.23, BNP 42 Labs (3/17): K 4.2, creatinine 1.19 Labs (5/17): K 4.3, creatinine 1.35, BNP 44 Labs (6/17): K 4.4, creatinine 1.18, BNP 44 Labs (10/17): K 4.2, creatinine 0.98, BNP 43  PMH: 1. Pulmonary hypertension: RHC (5/16) with mean RA 18, PA 83/33 mean 53, mean PCWP 19, CI 2.61, PVR 5.8 WU.   Echo (5/16) with EF 65-70%, septal flattening, dilated RV with PA systolic pressure 99 mmHg, +bubble study.  PFTs (4/16) with FVC 76%, FEV1 75%, ratio 97%, TLC 66%, DLCO 26% => mild restriction, mild obstruction, severe diffuse abnormality (pulmonary vascular problem).  CTA chest (4/16) with chronic interstitial and obstructive lung disease, small mediastinal and hilar lymph noes, no PE.  Lymph node biopsy negative for sarcoidosis (reactive changes).  V/Q scan (6/16): No acute or chronic PE. TEE (6/16): Normal LV size and systolic function, EF 55-60%,RV was mildly dilated with mildly decreased systolic function, D-shaped interventricular septum suggestive of RV pressure/volume overload, there appeared to be a small PFO present with some bubbles crossing, no ASD.  ANA negative, anti-RNP negative, HIV negative. Sleep study (8/16) without OSA.  She did not tolerate Adcirca or Revatio.  She cannot tolerate more than 200 mcg bid Selexipag.  - Echo (6/17): EF 60-65%, normal diastolic function,  D-shaped interventricular septum, mildly dilated RV with normal RV systolic function, PASP 39, IVC normal.  2. Chronic venous insufficiency. 3. Hyperlipidemia.  4. GERD 5. Chronic hypoxemic respiratory failure: Hypoxemia noted x 6 years at least.  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   SH: Married, quit smoking in 3/16.  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    FH: No pulmonary hypertension, no  premature CAD, no sudden death.   ROS: All systems reviewed and negative except as per HPI.    Current Outpatient Prescriptions  Medication Sig Dispense Refill  . aspirin EC 81 MG tablet Take 1 tablet (81 mg total) by mouth daily. 90 tablet 3  . cetirizine (ZYRTEC) 10 MG tablet Take 10 mg by mouth daily.    . citalopram (CELEXA) 40 MG tablet Take 40 mg by mouth daily.    Marland Kitchen HYDROcodone-acetaminophen (NORCO) 10-325 MG tablet Take 1 tablet by mouth every 8 (eight) hours as needed. 240 tablet 0  . Ipratropium-Albuterol (COMBIVENT RESPIMAT) 20-100 MCG/ACT AERS respimat Inhale 1 puff into the lungs every 6 (six) hours as needed for wheezing. 1 Inhaler 5  . ipratropium-albuterol (DUONEB) 0.5-2.5 (3) MG/3ML SOLN Take 3 mLs by nebulization 4 (four) times daily as needed. 360 mL 11  . Macitentan (OPSUMIT) 10 MG TABS Take 1 tablet by mouth daily. 90 tablet 3  . OXYGEN 2.5 lpm with sleep and 4 lpm with exertion  [facility]    . potassium chloride SA (K-DUR,KLOR-CON) 20 MEQ tablet Take 1 tablet (20 mEq total) by mouth 2 (two) times daily. 60 tablet 6  . pravastatin (PRAVACHOL) 20 MG tablet Take 20 mg by mouth daily.    . Riociguat (ADEMPAS) 1 MG TABS Take 1 mg by mouth 3 (three) times daily.    . Selexipag 200 MCG TABS Take 1 tablet (200 mcg total) by mouth 2 (two) times daily. 60 tablet 6  . Vitamin D, Ergocalciferol, (DRISDOL) 50000 UNITS CAPS capsule Take 50,000 Units by mouth every Monday.     . zolpidem (AMBIEN) 5 MG tablet Take 1 tablet (5 mg total) by mouth at bedtime. 30 tablet 5  . torsemide (DEMADEX) 20 MG tablet Take 4 tablets (80 mg total) by mouth daily. 120 tablet 3   No current facility-administered medications for this encounter.    BP 132/60 (BP Location: Left Arm, Patient Position: Sitting, Cuff Size: Normal)   Wt 258 lb (117 kg)   BMI 40.41 kg/m   Wt Readings from Last 3 Encounters:  10/22/16 258 lb (117 kg)  09/20/16 255 lb 12.8 oz (116 kg)  09/04/16 251 lb 3.2 oz (113.9 kg)       General: NAD.  Neck: JVP 8-9 cm, no thyromegaly or thyroid nodule. No carotid bruit. Lungs: Diminished throughout.  CV: Nondisplaced PMI.  Heart regular S1/S2, no S3, 2/6 HSM LLSB.      Abdomen: Soft, NT, ND, no HSM. No bruits or masses. +BS  Skin: Intact without lesions or rashes.  Neurologic: Alert and oriented x 3.  Psych: Normal affect. Extremities: No clubbing or cyanosis. Trace ankle edema. Normal pedal pulses.  HEENT: Normal.   Assessment/Plan:  1. Pulmonary arterial hypertension with RV failure: Severe on prior RHC. 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  There was concern from CTA chest for sarcoidosis, but lymph node biopsy was negative.  LFTs normal, ANA negative, anti-RNP negative, HIV negative.  V/Q scan negative for chronic PE.  Small PFO but no ASD on TEE.  No OSA on sleep study.  She did not tolerate Revatio due to joint pain (now resolved), and she did not tolerate Adcirca with worsening dyspnea.  She does not tolerate more than 200 mcg bid Selexipag due to pelvic pain.  She has been able to increase riociguat to 1 tid.  Echo 6/17 showed mildly dilated RV with normal systolic function and PASP appeared lower at 39 mmHg. Echo has looked better with PH treatment, but her symptoms and 6 minute walk have been fairly unchanged (6 minute walk a bit better today).  She is volume overloaded on exam and weight is up.   - Open lung biopsy deemed too risky, probably not sarcoid.   - Stop Lasix.  Start torsemide 80 mg daily.  BMET today and in 10 days.  - Continue Opsumit 10 mg daily. - Continue Selexipag at 200 mcg BID and do not titrate up given lack of tolerance. - Continue riociguat 1 mg tid => more compliant now, will titrate up soon.   2. Chronic hypoxemic respiratory failure: Chronic interstitial and obstructive lung disease, mediastinal and hilar lymphadenopathy.  Concern for sarcoidosis.   PFTs showed only mild restriction and obstruction.  Biopsy did not show sarcoid.  Deemed too high risk for open lung biopsy.  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 - Continue oxygen up to 5 L with activity, 3L at rest.  - Sees pulmonary, Dr [name].  Follow up 1 month to reassess fluid status and to monitor with increase in riociguat.  [name] 10/23/2016

## 2016-11-08 ENCOUNTER — Encounter: Payer: Self-pay | Admitting: Cardiology

## 2016-11-13 ENCOUNTER — Other Ambulatory Visit: Payer: Self-pay | Admitting: Physician Assistant

## 2016-11-20 ENCOUNTER — Ambulatory Visit (HOSPITAL_COMMUNITY)
Admission: RE | Admit: 2016-11-20 | Discharge: 2016-11-20 | Disposition: A | Discharge status: Home / Self Care | Payer: Self-pay | Source: Ambulatory Visit | Attending: Internal Medicine | Admitting: Internal Medicine

## 2016-11-20 VITALS — BP 130/68 | HR 76 | Wt 255.8 lb

## 2016-11-20 DIAGNOSIS — Z7982 Long term (current) use of aspirin: Secondary | ICD-10-CM | POA: Insufficient documentation

## 2016-11-20 DIAGNOSIS — E785 Hyperlipidemia, unspecified: Secondary | ICD-10-CM | POA: Insufficient documentation

## 2016-11-20 DIAGNOSIS — I872 Venous insufficiency (chronic) (peripheral): Secondary | ICD-10-CM | POA: Insufficient documentation

## 2016-11-20 DIAGNOSIS — R590 Localized enlarged lymph nodes: Secondary | ICD-10-CM | POA: Insufficient documentation

## 2016-11-20 DIAGNOSIS — K219 Gastro-esophageal reflux disease without esophagitis: Secondary | ICD-10-CM | POA: Insufficient documentation

## 2016-11-20 DIAGNOSIS — I5032 Chronic diastolic (congestive) heart failure: Secondary | ICD-10-CM | POA: Insufficient documentation

## 2016-11-20 DIAGNOSIS — I2721 Secondary pulmonary arterial hypertension: Secondary | ICD-10-CM | POA: Insufficient documentation

## 2016-11-20 DIAGNOSIS — Z87891 Personal history of nicotine dependence: Secondary | ICD-10-CM | POA: Insufficient documentation

## 2016-11-20 DIAGNOSIS — J439 Emphysema, unspecified: Secondary | ICD-10-CM | POA: Insufficient documentation

## 2016-11-20 DIAGNOSIS — J9611 Chronic respiratory failure with hypoxia: Secondary | ICD-10-CM | POA: Insufficient documentation

## 2016-11-20 LAB — BRAIN NATRIURETIC PEPTIDE: B Natriuretic Peptide: 64.5 pg/mL (ref 0.0–100.0)

## 2016-11-20 LAB — BASIC METABOLIC PANEL
Anion gap: 11 (ref 5–15)
BUN: 24 mg/dL — ABNORMAL HIGH (ref 6–20)
CHLORIDE: 101 mmol/L (ref 101–111)
CO2: 26 mmol/L (ref 22–32)
CREATININE: 1.21 mg/dL — AB (ref 0.44–1.00)
Calcium: 9 mg/dL (ref 8.9–10.3)
GFR calc non Af Amer: 48 mL/min — ABNORMAL LOW (ref >60)
GFR, EST AFRICAN AMERICAN: 55 mL/min — AB (ref >60)
Glucose, Bld: 111 mg/dL — ABNORMAL HIGH (ref 65–99)
Potassium: 4.5 mmol/L (ref 3.5–5.1)
Sodium: 138 mmol/L (ref 135–145)

## 2016-11-20 NOTE — Progress Notes (Signed)
Advanced Heart Failure Medication Review by a Pharmacist  Does the patient  feel that his/her medications are working for him/her?  yes  Has the patient been experiencing any side effects to the medications prescribed?  no  Does the patient measure his/her own blood pressure or blood glucose at home?  no   Does the patient have any problems obtaining medications due to transportation or finances?   no  Understanding of regimen: good Understanding of indications: good Potential of compliance: good Patient understands to avoid NSAIDs. Patient understands to avoid decongestants.  Issues to address at subsequent visits: None   Pharmacist comments:  Ms. Picazo is a pleasant 60 yo F presenting without her medications but with good recall of her regimen. She reports good compliance with her regimen and did not have any specific medication-related questions or concerns for me at this time.   Tyler Deis. Bonnye Fava, PharmD, BCPS, CPP Clinical Pharmacist Pager: [phone] Phone: [phone] 11/20/2016 10:37 AM      Time with patient: 10 minutes Preparation and documentation time: 2 minutes Total time: 12 minutes

## 2016-11-20 NOTE — Progress Notes (Signed)
Patient ID: Kelly Donaldson, female   DOB: [birth year], 60 y.o.   MRN: [id number]   PCP: Prudy Feeler Pulmonology: Dr [name] Primary HF: Dr Shirlee Latch   60 yo with history of chronic hypoxemic respiratory failure and pulmonary hypertension.  She has been followed by Dr [name] with pulmonology.  She says that she has been on home oxygen for 6 years.  She quit smoking in 2016.  She had an echo done in 5/16 showing dilated RV with severe pulmonary hypertension.  She therefore had RHC in 5/16 which confirmed severe pulmonary hypertension and RV failure.  PFTs showed only mild obstruction and restriction with markedly decreased DLCO suggestive of pulmonary vascular disease.  There was concern for sarcoidosis on CT chest but lymph node biopsy showed no evidence for sarcoidosis.  V/Q scan showed no evidence for chronic PE.   She has not tolerated Revatio or Adcirca.  Have failed previous up-titration of Selexipag with pelvic pain, can tolerate 200 mcg bid.  She is now tolerating riociguat 1 mg tid.   She presents today for regular follow up. At last visit switched to torsemide. Down 3 lbs.  Feeling good overall.  At home 02 sats range in upper 80s/low 90s with 02 via [place] 3-5Lpm. Riociguate increased to 1.5 mg TID. (about 2-3 weeks ago).  Thinks she is starting to be able to tell a difference.  Still SOB after walking about 50-75 yards.  Doesn't want to walk today, daughter had minor surgery at [facility] and this pt was awake until around 0330. Taking torsemide and peeing a lot better than when she was on lasix. Watching sodium and fluid.    6 minute walk (5/16): 238 meters => decreased oxygen saturation to 73%, increased to 6 L [place] 6 minute walk (7/16): 170 meters => unable to complete.  6 minute walk (9/16): 201 meters 6 minute walk (1/17): 256 meters 6 minute walk (4/17): 219 meters 6 minute walk (8/17): 225 meters 6 minute walk (11/17): 244 meters  Labs (2/16): BNP 69 Labs (5/16): LFTs normal, HCT 34.9, RF  negative, HIV negative, TSH negative Labs (6/16): K 4.4, creatinine 0.88 Labs (7/16): ANA negative, BNP 58, K 4.3, creatinine 1.1, anti-RNP negative Labs (9/16): K 4.4, creatinine 1.26, BNP 91 Labs (10/16): K 4.3, creatinine 1.04 Labs (12/16): K 4.2, creatinine 1.07, BNP 130 Labs (1/17): K 4.3, creatinine 1.23, BNP 42 Labs (3/17): K 4.2, creatinine 1.19 Labs (5/17): K 4.3, creatinine 1.35, BNP 44 Labs (6/17): K 4.4, creatinine 1.18, BNP 44 Labs (10/17): K 4.2, creatinine 0.98, BNP 43  PMH: 1. Pulmonary hypertension: RHC (5/16) with mean RA 18, PA 83/33 mean 53, mean PCWP 19, CI 2.61, PVR 5.8 WU.   Echo (5/16) with EF 65-70%, septal flattening, dilated RV with PA systolic pressure 99 mmHg, +bubble study.  PFTs (4/16) with FVC 76%, FEV1 75%, ratio 97%, TLC 66%, DLCO 26% => mild restriction, mild obstruction, severe diffuse abnormality (pulmonary vascular problem).  CTA chest (4/16) with chronic interstitial and obstructive lung disease, small mediastinal and hilar lymph noes, no PE.  Lymph node biopsy negative for sarcoidosis (reactive changes).  V/Q scan (6/16): No acute or chronic PE. TEE (6/16): Normal LV size and systolic function, EF 55-60%,RV was mildly dilated with mildly decreased systolic function, D-shaped interventricular septum suggestive of RV pressure/volume overload, there appeared to be a small PFO present with some bubbles crossing, no ASD.  ANA negative, anti-RNP negative, HIV negative. Sleep study (8/16) without OSA.  She did not tolerate  Adcirca or Revatio.  She cannot tolerate more than 200 mcg bid Selexipag.  - Echo (6/17): EF 60-65%, normal diastolic function, D-shaped interventricular septum, mildly dilated RV with normal RV systolic function, PASP 39, IVC normal.  2. Chronic venous insufficiency. 3. Hyperlipidemia.  4. GERD 5. Chronic hypoxemic respiratory failure: Hypoxemia noted x 6 years at least.  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    SH: Married, quit smoking in 3/16.  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    FH: No pulmonary hypertension, no premature CAD, no sudden death.   ROS: All systems reviewed and negative except as per HPI.    Current Outpatient Prescriptions  Medication Sig Dispense Refill  . aspirin EC 81 MG tablet Take 1 tablet (81 mg total) by mouth daily. 90 tablet 3  . cetirizine (ZYRTEC) 10 MG tablet Take 10 mg by mouth daily.    . citalopram (CELEXA) 40 MG tablet Take 40 mg by mouth daily.    Marland Kitchen HYDROcodone-acetaminophen (NORCO) 10-325 MG tablet Take 1 tablet by mouth every 8 (eight) hours as needed. 240 tablet 0  . Ipratropium-Albuterol (COMBIVENT RESPIMAT) 20-100 MCG/ACT AERS respimat Inhale 1 puff into the lungs every 6 (six) hours as needed for wheezing. 1 Inhaler 5  . ipratropium-albuterol (DUONEB) 0.5-2.5 (3) MG/3ML SOLN Take 3 mLs by nebulization 4 (four) times daily as needed. 360 mL 11  . Macitentan (OPSUMIT) 10 MG TABS Take 1 tablet by mouth daily. 90 tablet 3  . OXYGEN 2.5 lpm with sleep and 4 lpm with exertion  [facility]    . potassium chloride SA (K-DUR,KLOR-CON) 20 MEQ tablet Take 1 tablet (20 mEq total) by mouth 2 (two) times daily. 60 tablet 6  . pravastatin (PRAVACHOL) 20 MG tablet Take 20 mg by mouth daily.    . Riociguat (ADEMPAS) 1.5 MG TABS Take 1.5 mg by mouth 3 (three) times daily.    . Selexipag 200 MCG TABS Take 1 tablet (200 mcg total) by mouth 2 (two) times daily. 60 tablet 6  . torsemide (DEMADEX) 20 MG tablet Take 4 tablets (80 mg total) by mouth daily. 120 tablet 3  . Vitamin D, Ergocalciferol, (DRISDOL) 50000 units CAPS capsule TAKE ONE CAPSULE BY MOUTH ONCE WEEKLY FOR 30 DAYS 4 capsule 6  . zolpidem (AMBIEN) 5 MG tablet Take 1 tablet (5 mg total) by mouth at bedtime. 30 tablet 5   No current facility-administered medications for this encounter.    BP 130/68 (BP Location: Left Arm, Patient Position: Sitting, Cuff Size: Normal)   Pulse 76   Wt 255 lb  12.8 oz (116 kg)   SpO2 (!) 88% Comment: on 3L  BMI 40.06 kg/m   Wt Readings from Last 3 Encounters:  11/20/16 255 lb 12.8 oz (116 kg)  10/22/16 258 lb (117 kg)  09/20/16 255 lb 12.8 oz (116 kg)     General: NAD.  Neck: Thick, JVP difficult to assess but appears at least 7-8 cm, No thyromegaly or thyroid nodule. No carotid bruit. Lungs: Clear, normal effort.  CV: Nondisplaced PMI.  Heart regular S1/S2, no S3, 2/6 HSM LLSB.      Abdomen: Soft, NT, ND, no HSM. No bruits or masses. +BS  Skin: Intact without lesions or rashes.  Neurologic: Alert and oriented x 3.  Psych: Normal Affect.  Extremities: No clubbing or cyanosis. Trace - 1+ ankle edema. Normal pedal pulses.  HEENT: Normal.  Assessment/Plan:  1. Pulmonary arterial hypertension with RV failure: Severe  on prior RHC. 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  There was concern from CTA chest for sarcoidosis, but lymph node biopsy was negative.  LFTs normal, ANA negative, anti-RNP negative, HIV negative.  V/Q scan negative for chronic PE.  Small PFO but no ASD on TEE.  No OSA on sleep study.  She did not tolerate Revatio due to joint pain (now resolved), and she did not tolerate Adcirca with worsening dyspnea.  She does not tolerate more than 200 mcg bid Selexipag due to pelvic pain.  She has been able to increase riociguat to 1 tid.  Echo 6/17 showed mildly dilated RV with normal systolic function and PASP appeared lower at 39 mmHg. Echo has looked better with PH treatment, but her symptoms and 6 minute walk have been fairly unchanged (6 minute walk a bit better last visit).  - Volume status looks OK on exam. She does have some continued dietary indiscretion ( chips and fries yesterday.)  Discussed at length today.  - Open lung biopsy deemed too risky, probably not sarcoid.   - Continue torsemide 80 mg daily. BMET/BNP today. Can take extra 20-40 mg of  torsemide as needed for swelling.  - Continue Opsumit 10 mg daily. - Continue Selexipag at 200 mcg BID and do not titrate up given lack of tolerance. - Continue riociguat 1.5 mg TID. Continue up-titration protocol.   - Reinforced fluid restriction to < 2 L daily, sodium restriction to less than 2000 mg daily, and the importance of daily weights.  2. Chronic hypoxemic respiratory failure: Chronic interstitial and obstructive lung disease, mediastinal and hilar lymphadenopathy.  Concern for sarcoidosis.  PFTs showed only mild restriction and obstruction.  Biopsy did not show sarcoid.  Deemed too high risk for open lung biopsy.  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 - Continue oxygen up to 5 L with activity, 3L at rest.  - Sees pulmonary, Dr [name].  Labs today with switch to torsemide. Tolerating up-titration of riociguat well so far with compliance, and actually feeling better. Repeat at next visit.   Graciella Freer, PA-C 11/20/2016

## 2016-11-20 NOTE — Patient Instructions (Addendum)
Please call [facility] at [phone] for further patient assistance with Opsumit (expires 12/08/16).   Routine lab work today. Will notify you of abnormal results, otherwise no news is good news!  Follow up 2 months with [name].  Merry Christmas and Happy New Year!  Do the following things EVERYDAY: 1) Weigh yourself in the morning before breakfast. Write it down and keep it in a log. 2) Take your medicines as prescribed 3) Eat low salt foods-Limit salt (sodium) to 2000 mg per day.  4) Stay as active as you can everyday 5) Limit all fluids for the day to less than 2 liters

## 2016-11-26 ENCOUNTER — Ambulatory Visit (INDEPENDENT_AMBULATORY_CARE_PROVIDER_SITE_OTHER): Payer: Self-pay | Admitting: Pulmonary Disease

## 2016-11-26 ENCOUNTER — Encounter: Payer: Self-pay | Admitting: Pulmonary Disease

## 2016-11-26 VITALS — BP 130/68 | HR 86 | Ht 67.0 in | Wt 250.0 lb

## 2016-11-26 DIAGNOSIS — J849 Interstitial pulmonary disease, unspecified: Secondary | ICD-10-CM

## 2016-11-26 DIAGNOSIS — R0600 Dyspnea, unspecified: Secondary | ICD-10-CM

## 2016-11-26 DIAGNOSIS — R0609 Other forms of dyspnea: Secondary | ICD-10-CM

## 2016-11-26 NOTE — Patient Instructions (Signed)
For your COPD: Continue using Combivent on an as as-needed basis for shortness of breath, this is in her changeable with the albuterol nebulizer you have at home Remember to practice good hand hygiene as we discussed  For your interstitial lung disease (scarring in your lungs): We will repeat a lung function test in May 2018 when you return  For your pulmonary hypertension: Continue titration of your pulmonary hypertension medications as directed by the advanced heart failure clinic  I will see you back in 6 months or sooner if needed

## 2016-11-26 NOTE — Progress Notes (Signed)
Subjective:    Patient ID: Kelly Donaldson, female    DOB: [birth year], 60 y.o.   MRN: [id number]  Synopsis: First seen by [facility] in 2017 for evaluation of dyspnea in the setting of WHO group 3, and group 2 pulmonary hypertension. Has a nondescript interstitial lung disease with significant centrilobular emphysema.  Smoked from age [age] 1ppd with one 10 year break   HPI  Chief Complaint  Patient presents with  . Follow-up    review 57mw.  Pt states she is doing well, does note sob with exertion.    Since the last visit.  No new breathing complaints today, feels like she is doing OK.  She had her adempas increased to 1.5mg  without any significant side effects.  The dose was increased two weeks ago.  She has been tolerating this well.    She says that her blood pressure was a little low a month ago at 98/58.  She does not have cough, chest tightness or wheezing.  She had a cold a few weeks ago that resolved spontaneously.    She used her combivent inhaler twice for wheezing and it helped.    Past Medical History:  Diagnosis Date  . Allergy   . Depression   . GERD (gastroesophageal reflux disease)   . Hyperlipidemia   . Oxygen dependent   . Shortness of breath dyspnea   . Varicose veins      Review of Systems  Constitutional: Negative for chills, fatigue and fever.  HENT: Negative for postnasal drip, rhinorrhea and sinus pressure.   Respiratory: Positive for shortness of breath. Negative for cough and wheezing.        Objective:   Physical Exam  Vitals:   11/26/16 1050  BP: 130/68  Pulse: 86  SpO2: 92%  Weight: 250 lb (113.4 kg)  Height: 5\' 7"  (1.702 m)   3L [place]  Gen: well appearing HENT: OP clear, TM's clear, neck supple PULM: Crackles bases B, normal percussion CV: RRR, systolic murmur, trace edema GI: BS+, soft, nontender Derm: no cyanosis or rash Psyche: normal mood and affect  Echo: Bubble echo 04/13/15 with R to L  shunt   RHC: 04/16/15 Hemodynamics RA mean 18 RV 81/18 PA 83/33, mean 53 PCWP mean 19 Oxygen saturations: PA 63% AO 98% Cardiac Output (Fick) 5.82  Cardiac Index (Fick) 2.61 PVR 5.8 WU Rec:  Lasix/ masitentan   Imaging: CT chest images April 2016  showing a fibrotic process in the upper lobes extending into the periphery of the lower lobes, certainly worse in the upper lobes and the lower lobes are appears to be groundglass in the upper lobes. There is no pulmonary embolism  08/2016 high-resolution CT scan of the chest: Kelly Donaldson review: Significant upper lobe centrilobular emphysema .  Peripheral based interlobular septal thickening and groundglass consistent with fibrosis with some mild bronchiectasis, fibrotic changes appear to be more prominent in the lower lobes with upper lobe nodules, no air trapping noted on expiratory images, mediastinal lymphadenopathy stable per radiology  6 minute walk test 6 minute walk (7/16): 170 meters => unable to complete.  6 minute walk (9/16): 201 meters 6 minute walk (1/17): 256 meters 6 minute walk (4/17): 219 meters 6 minute walk (8/17): 225 meters 6 minute walk (11/17): 244 meters 11/26/2016 walk 255 m on 4 L nasal cannula. O2 saturation dropped transiently to 86%. O2 saturation improved with 5 L nasal cannula to 95% while exerting herself.   CBC    Component Value  Date/Time   WBC 7.8 12/08/2015 1215   RBC 3.76 (L) 12/08/2015 1215   HGB 9.9 (L) 12/08/2015 1215   HCT 31.6 (L) 12/08/2015 1215   PLT 184 12/08/2015 1215   MCV 84.0 12/08/2015 1215   MCH 26.3 12/08/2015 1215   MCHC 31.3 12/08/2015 1215   RDW 14.5 12/08/2015 1215   LYMPHSABS 2.4 03/30/2015 1610   MONOABS 0.4 03/30/2015 1610   EOSABS 0.2 03/30/2015 1610   BASOSABS 0.0 03/30/2015 1610   Cardiology records reviewed where she was seen for her pulmonary hypertension     Assessment & Plan:  Impression:  Centrilobular emphysema Interstitial lung disease Chronic  respiratory failure with hypoxemia Pulmonary hypertension  Discussion: Kelly Donaldson has pulmonary hypertension in the setting of significant centrilobular emphysema and a poorly defined interstitial lung disease. Fortunately, her COPD (centrilobular emphysema) has not caused her to have any flareups in the last several months. Her 6 minute walk distance today was good and she seems to be tolerating her pulmonary vasodilator therapy appropriately. At this point there is no indication for adding further bronchodilator therapies other than as needed Combivent.  In terms of her interstitial lung disease, the only way to know for certain what is going on here would be to do an open lung biopsy but that would be quite dangerous.  She will continue titrating her pulmonary vasodilator therapy as per direction by the advanced heart failure clinic.  For your COPD: Continue using Combivent on an as as-needed basis for shortness of breath, this is in her changeable with the albuterol nebulizer you have at home Remember to practice good hand hygiene as we discussed  For your interstitial lung disease (scarring in your lungs): We will repeat a lung function test in May 2018 when you return  For your pulmonary hypertension: Continue titration of your pulmonary hypertension medications as directed by the advanced heart failure clinic  For your chronic respiratory failure with hypoxemia: Use 4L at rest, 5L with exertion  I will see you back in 6 months or sooner if needed  Current Outpatient Prescriptions:  .  aspirin EC 81 MG tablet, Take 1 tablet (81 mg total) by mouth daily., Disp: 90 tablet, Rfl: 3 .  cetirizine (ZYRTEC) 10 MG tablet, Take 10 mg by mouth daily., Disp: , Rfl:  .  citalopram (CELEXA) 40 MG tablet, Take 40 mg by mouth daily., Disp: , Rfl:  .  HYDROcodone-acetaminophen (NORCO) 10-325 MG tablet, Take 1 tablet by mouth every 8 (eight) hours as needed., Disp: 240 tablet, Rfl: 0 .   Ipratropium-Albuterol (COMBIVENT RESPIMAT) 20-100 MCG/ACT AERS respimat, Inhale 1 puff into the lungs every 6 (six) hours as needed for wheezing., Disp: 1 Inhaler, Rfl: 5 .  ipratropium-albuterol (DUONEB) 0.5-2.5 (3) MG/3ML SOLN, Take 3 mLs by nebulization 4 (four) times daily as needed., Disp: 360 mL, Rfl: 11 .  Macitentan (OPSUMIT) 10 MG TABS, Take 1 tablet by mouth daily., Disp: 90 tablet, Rfl: 3 .  OXYGEN, 2.5 lpm with sleep and 4 lpm with exertion  [employer], Disp: , Rfl:  .  potassium chloride SA (K-DUR,KLOR-CON) 20 MEQ tablet, Take 1 tablet (20 mEq total) by mouth 2 (two) times daily., Disp: 60 tablet, Rfl: 6 .  pravastatin (PRAVACHOL) 20 MG tablet, Take 20 mg by mouth daily., Disp: , Rfl:  .  Riociguat (ADEMPAS) 1.5 MG TABS, Take 1.5 mg by mouth 3 (three) times daily., Disp: , Rfl:  .  Selexipag 200 MCG TABS, Take 1 tablet (200 mcg total)  by mouth 2 (two) times daily., Disp: 60 tablet, Rfl: 6 .  torsemide (DEMADEX) 20 MG tablet, Take 4 tablets (80 mg total) by mouth daily., Disp: 120 tablet, Rfl: 3 .  Vitamin D, Ergocalciferol, (DRISDOL) 50000 units CAPS capsule, TAKE ONE CAPSULE BY MOUTH ONCE WEEKLY FOR 30 DAYS, Disp: 4 capsule, Rfl: 6 .  zolpidem (AMBIEN) 5 MG tablet, Take 1 tablet (5 mg total) by mouth at bedtime., Disp: 30 tablet, Rfl: 5

## 2016-12-13 ENCOUNTER — Encounter: Payer: Self-pay | Admitting: Physician Assistant

## 2016-12-13 ENCOUNTER — Ambulatory Visit (INDEPENDENT_AMBULATORY_CARE_PROVIDER_SITE_OTHER): Payer: Self-pay | Admitting: Physician Assistant

## 2016-12-13 VITALS — BP 98/60 | HR 76 | Temp 97.1°F | Ht 67.0 in | Wt 256.8 lb

## 2016-12-13 DIAGNOSIS — J020 Streptococcal pharyngitis: Secondary | ICD-10-CM

## 2016-12-13 DIAGNOSIS — J029 Acute pharyngitis, unspecified: Secondary | ICD-10-CM

## 2016-12-13 DIAGNOSIS — M5136 Other intervertebral disc degeneration, lumbar region: Secondary | ICD-10-CM

## 2016-12-13 LAB — RAPID STREP SCREEN (MED CTR MEBANE ONLY): Strep Gp A Ag, IA W/Reflex: POSITIVE — AB

## 2016-12-13 MED ORDER — HYDROCODONE-ACETAMINOPHEN 10-325 MG PO TABS: 1.0000 | ORAL_TABLET | Freq: Four times a day (QID) | ORAL | 0 refills | Status: DC | PRN

## 2016-12-13 MED ORDER — HYDROCODONE-HOMATROPINE 5-1.5 MG/5ML PO SYRP: 5.0000 mL | ORAL_SOLUTION | Freq: Four times a day (QID) | ORAL | 0 refills | Status: DC | PRN

## 2016-12-13 MED ORDER — HYDROCODONE-ACETAMINOPHEN 10-325 MG PO TABS: 1.0000 | ORAL_TABLET | Freq: Three times a day (TID) | ORAL | 0 refills | Status: DC | PRN

## 2016-12-13 MED ORDER — AMOXICILLIN 500 MG PO CAPS: 1000.0000 mg | ORAL_CAPSULE | Freq: Two times a day (BID) | ORAL | 0 refills | Status: DC

## 2016-12-13 NOTE — Patient Instructions (Signed)
Strep Throat Strep throat is a bacterial infection of the throat. Your health care provider may call the infection tonsillitis or pharyngitis, depending on whether there is swelling in the tonsils or at the back of the throat. Strep throat is most common during the cold months of the year in children who are 5-61 years of age, but it can happen during any season in people of any age. This infection is spread from person to person (contagious) through coughing, sneezing, or close contact. What are the causes? Strep throat is caused by the bacteria called Streptococcus pyogenes. What increases the risk? This condition is more likely to develop in:  People who spend time in crowded places where the infection can spread easily.  People who have close contact with someone who has strep throat.  What are the signs or symptoms? Symptoms of this condition include:  Fever or chills.  Redness, swelling, or pain in the tonsils or throat.  Pain or difficulty when swallowing.  White or yellow spots on the tonsils or throat.  Swollen, tender glands in the neck or under the jaw.  Red rash all over the body (rare).  How is this diagnosed? This condition is diagnosed by performing a rapid strep test or by taking a swab of your throat (throat culture test). Results from a rapid strep test are usually ready in a few minutes, but throat culture test results are available after one or two days. How is this treated? This condition is treated with antibiotic medicine. Follow these instructions at home: Medicines  Take over-the-counter and prescription medicines only as told by your health care provider.  Take your antibiotic as told by your health care provider. Do not stop taking the antibiotic even if you start to feel better.  Have family members who also have a sore throat or fever tested for strep throat. They may need antibiotics if they have the strep infection. Eating and drinking  Do not  share food, drinking cups, or personal items that could cause the infection to spread to other people.  If swallowing is difficult, try eating soft foods until your sore throat feels better.  Drink enough fluid to keep your urine clear or pale yellow. General instructions  Gargle with a salt-water mixture 3-4 times per day or as needed. To make a salt-water mixture, completely dissolve -1 tsp of salt in 1 cup of warm water.  Make sure that all household members wash their hands well.  Get plenty of rest.  Stay home from school or work until you have been taking antibiotics for 24 hours.  Keep all follow-up visits as told by your health care provider. This is important. Contact a health care provider if:  The glands in your neck continue to get bigger.  You develop a rash, cough, or earache.  You cough up a thick liquid that is green, yellow-brown, or bloody.  You have pain or discomfort that does not get better with medicine.  Your problems seem to be getting worse rather than better.  You have a fever. Get help right away if:  You have new symptoms, such as vomiting, severe headache, stiff or painful neck, chest pain, or shortness of breath.  You have severe throat pain, drooling, or changes in your voice.  You have swelling of the neck, or the skin on the neck becomes red and tender.  You have signs of dehydration, such as fatigue, dry mouth, and decreased urination.  You become increasingly sleepy, or   you cannot wake up completely.  Your joints become red or painful. This information is not intended to replace advice given to you by your health care provider. Make sure you discuss any questions you have with your health care provider. Document Released: 11/22/2000 Document Revised: 07/24/2016 Document Reviewed: 03/20/2015 Elsevier Interactive Patient Education  2017 Elsevier Inc.  

## 2016-12-15 NOTE — Progress Notes (Signed)
BP 98/60   Pulse 76   Temp 97.1 F (36.2 C) (Oral)   Ht 5\' 7"  (1.702 m)   Wt 256 lb 12.8 oz (116.5 kg)   BMI 40.22 kg/m    Subjective:    Patient ID: , female    DOB: Oct 28, 1956, 61 y.o.   MRN: [id number]  HPI: Kelly Donaldson is a 61 y.o. female presenting on 12/13/2016 for Follow-up (3 month )  This patient comes in for periodic recheck on medications and conditions. All medications are reviewed today. There are no reports of any problems with the medications. All of the medical conditions are reviewed and updated.  Lab work is reviewed and will be ordered as medically necessary.   Pain assessment: Cause of pain- DDD Pain location- lumbar Pain on scale of 1-10- 6-8 Frequency- What increases pain-standing, lifting, chores What makes pain Better-rest Effects on ADL - moderate Any change in general medical condition-no  Current medications- hydrocodone 10 1-2 QID Effectiveness of current meds-good Adverse reactions form pain meds-none  Pill count performed-No Urine drug screen- No Was the NCCSR reviewed- yes  If yes were their any concerning findings? - no  Patient is also been having severe sore throat and nausea. She has had significant cough. She has not had a tremendous amount of congestion. She has had fever and chills at times.   Past Medical History:  Diagnosis Date  . Allergy   . Depression   . GERD (gastroesophageal reflux disease)   . Hyperlipidemia   . Oxygen dependent   . Shortness of breath dyspnea   . Varicose veins    Relevant past medical, surgical, family and social history reviewed and updated as indicated. Interim medical history since our last visit reviewed. Allergies and medications reviewed and updated. DATA REVIEWED: CHART IN EPIC  Social History   Social History  . Marital status: Married    Spouse name: N/A  . Number of children: N/A  . Years of education: N/A   Occupational History  . Unemployed    Social History  Main Topics  . Smoking status: Former Smoker    Packs/day: 0.25    Years: 17.00    Types: Cigarettes    Quit date: 03/02/2015  . Smokeless tobacco: Never Used  . Alcohol use No  . 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 Sexual activity: Not on file   Other Topics Concern  . Not on file   Social History Narrative  . No narrative on file    Past Surgical History:  Procedure Laterality Date  . CARDIAC CATHETERIZATION N/A 04/20/2015   Procedure: Right Heart Cath;  Surgeon: 06/20/2015, MD;  Location: [facility] INVASIVE CV LAB;  Service: Cardiovascular;  Laterality: N/A;  . TEE WITHOUT CARDIOVERSION N/A 05/11/2015   Procedure: TRANSESOPHAGEAL ECHOCARDIOGRAM (TEE);  Surgeon: 07/11/2015, MD;  Location: [facility] ENDOSCOPY;  Service: Cardiovascular;  Laterality: N/A;  . TUBAL LIGATION     11/84    Family History  Problem Relation Age of Onset  . Hyperlipidemia Mother   . Hypertension Mother   . Diabetes Mother   . Other Mother     varicose veins  . Hyperlipidemia Father   . Hypertension Father   . Emphysema Father     smoked  . Hypertension Daughter   . Asthma Sister   . Clotting disorder Daughter   . Breast cancer Maternal  Grandmother   . Ovarian cancer Maternal Aunt     Review of Systems  Constitutional: Positive for fatigue and fever. Negative for activity change.  HENT: Negative.   Eyes: Negative.   Respiratory: Negative.  Negative for cough.   Cardiovascular: Negative.  Negative for chest pain.  Gastrointestinal: Negative.  Negative for abdominal pain.  Endocrine: Negative.   Genitourinary: Negative.  Negative for dysuria.  Musculoskeletal: Positive for arthralgias and back pain.  Skin: Negative.   Neurological: Negative.     Allergies as of 12/13/2016      Reactions   Gabapentin    Pt states that she can not take with antidepressants   Latex Rash      Medication List       Accurate as of 12/13/16 11:59 PM. Always use your  most recent med list.          ADEMPAS 1.5 MG Tabs Generic drug:  Riociguat Take 1.5 mg by mouth 3 (three) times daily.   amoxicillin 500 MG capsule Commonly known as:  AMOXIL Take 2 capsules (1,000 mg total) by mouth 2 (two) times daily.   aspirin EC 81 MG tablet Take 1 tablet (81 mg total) by mouth daily.   cetirizine 10 MG tablet Commonly known as:  ZYRTEC Take 10 mg by mouth daily.   citalopram 40 MG tablet Commonly known as:  CELEXA Take 40 mg by mouth daily.   HYDROcodone-acetaminophen 10-325 MG tablet Commonly known as:  NORCO Take 1-2 tablets by mouth every 6 (six) hours as needed.   HYDROcodone-acetaminophen 10-325 MG tablet Commonly known as:  NORCO Take 1-2 tablets by mouth every 6 (six) hours as needed.   HYDROcodone-acetaminophen 10-325 MG tablet Commonly known as:  NORCO Take 1-2 tablets by mouth every 6 (six) hours as needed.   HYDROcodone-homatropine 5-1.5 MG/5ML syrup Commonly known as:  HYCODAN Take 5-10 mLs by mouth every 6 (six) hours as needed for cough.   Ipratropium-Albuterol 20-100 MCG/ACT Aers respimat Commonly known as:  COMBIVENT RESPIMAT Inhale 1 puff into the lungs every 6 (six) hours as needed for wheezing.   ipratropium-albuterol 0.5-2.5 (3) MG/3ML Soln Commonly known as:  DUONEB Take 3 mLs by nebulization 4 (four) times daily as needed.   Macitentan 10 MG Tabs Commonly known as:  OPSUMIT Take 1 tablet by mouth daily.   OXYGEN 2.5 lpm with sleep and 4 lpm with exertion  [employer]   potassium chloride SA 20 MEQ tablet Commonly known as:  K-DUR,KLOR-CON Take 1 tablet (20 mEq total) by mouth 2 (two) times daily.   pravastatin 20 MG tablet Commonly known as:  PRAVACHOL Take 20 mg by mouth daily.   Selexipag 200 MCG Tabs Take 1 tablet (200 mcg total) by mouth 2 (two) times daily.   torsemide 20 MG tablet Commonly known as:  DEMADEX Take 4 tablets (80 mg total) by mouth daily.   Vitamin D (Ergocalciferol) 50000 units  Caps capsule Commonly known as:  DRISDOL TAKE ONE CAPSULE BY MOUTH ONCE WEEKLY FOR 30 DAYS   zolpidem 5 MG tablet Commonly known as:  AMBIEN Take 1 tablet (5 mg total) by mouth at bedtime.          Objective:    BP 98/60   Pulse 76   Temp 97.1 F (36.2 C) (Oral)   Ht 5\' 7"  (1.702 m)   Wt 256 lb 12.8 oz (116.5 kg)   BMI 40.22 kg/m   Allergies  Allergen Reactions  . Gabapentin  Pt states that she can not take with antidepressants  . Latex Rash    Wt Readings from Last 3 Encounters:  12/13/16 256 lb 12.8 oz (116.5 kg)  11/26/16 250 lb (113.4 kg)  11/20/16 255 lb 12.8 oz (116 kg)    Physical Exam  Constitutional: She is oriented to person, place, and time. She appears well-developed and well-nourished.  HENT:  Head: Normocephalic and atraumatic.  Right Ear: Tympanic membrane and ear canal normal.  Left Ear: Tympanic membrane and ear canal normal.  Nose: Mucosal edema and rhinorrhea present.  Mouth/Throat: Oropharyngeal exudate, posterior oropharyngeal edema and posterior oropharyngeal erythema present.  Eyes: Conjunctivae and EOM are normal. Pupils are equal, round, and reactive to light.  Cardiovascular: Normal rate, regular rhythm, normal heart sounds and intact distal pulses.   Pulmonary/Chest: Effort normal and breath sounds normal.  Abdominal: Soft. Bowel sounds are normal.  Musculoskeletal:       Lumbar back: She exhibits decreased range of motion, tenderness, pain and spasm.  Neurological: She is alert and oriented to person, place, and time. She has normal reflexes.  Skin: Skin is warm and dry. No rash noted.  Psychiatric: She has a normal mood and affect. Her behavior is normal. Judgment and thought content normal.    Results for orders placed or performed in visit on 12/13/16  Rapid strep screen (not at [facility])  Result Value Ref Range   Strep Gp A Ag, IA W/Reflex Positive (A) Negative      Assessment & Plan:   1. Sore throat - Rapid strep screen  (not at [facility])  2. DDD (degenerative disc disease), lumbar - HYDROcodone-acetaminophen (NORCO) 10-325 MG tablet; Take 1-2 tablets by mouth every 6 (six) hours as needed.  Dispense: 240 tablet; Refill: 0 - HYDROcodone-acetaminophen (NORCO) 10-325 MG tablet; Take 1-2 tablets by mouth every 6 (six) hours as needed.  Dispense: 240 tablet; Refill: 0 - HYDROcodone-acetaminophen (NORCO) 10-325 MG tablet; Take 1-2 tablets by mouth every 6 (six) hours as needed.  Dispense: 240 tablet; Refill: 0  3. Streptococcal sore throat Amoxicillin 500 mg 2 tab BID 10days Hycodan for cough 1-2 tsp QID cough  Continue all other maintenance medications as listed above.  Follow up plan: Return in about 3 months (around 03/13/2017) for recheck.  Orders Placed This Encounter  Procedures  . Rapid strep screen (not at [facility])    Educational handout given for strep throat  Remus Loffler PA-C [facility] [address]  [place], [place] [address] [phone]   12/15/2016, 7:05 PM

## 2017-01-10 ENCOUNTER — Telehealth (HOSPITAL_COMMUNITY): Payer: Self-pay | Admitting: Cardiology

## 2017-01-10 NOTE — Telephone Encounter (Signed)
Patient reports with increase in medication- medication name was not left. Patient reports increased heart rate and heavy breathing  LMOM with detailed instructions to return call to medication support staff/nursing as they may re-dose meds and advised further. She should return call for further instructions if needled

## 2017-01-14 ENCOUNTER — Telehealth (HOSPITAL_COMMUNITY): Payer: Self-pay | Admitting: *Deleted

## 2017-01-14 ENCOUNTER — Other Ambulatory Visit (HOSPITAL_COMMUNITY): Payer: Self-pay | Admitting: *Deleted

## 2017-01-14 MED ORDER — RIOCIGUAT 1.5 MG PO TABS: 2.0000 mg | ORAL_TABLET | Freq: Three times a day (TID) | ORAL | 3 refills | Status: DC

## 2017-01-14 NOTE — Telephone Encounter (Signed)
[facility] pharmacy called wanting to verify patient's current dose of Adempas.  Called patient and she stated she was unable to tolerate the 2.5 mg TID dosage and was instructed by RN to decrease back to 2 mg TID.    Called [facility] back and verified dosage, also updated medication in Epic.

## 2017-02-07 ENCOUNTER — Ambulatory Visit (HOSPITAL_COMMUNITY)
Admission: RE | Admit: 2017-02-07 | Discharge: 2017-02-07 | Disposition: A | Discharge status: Home / Self Care | Payer: Self-pay | Source: Ambulatory Visit | Attending: Cardiology | Admitting: Cardiology

## 2017-02-07 VITALS — BP 100/52 | HR 79 | Wt 250.6 lb

## 2017-02-07 DIAGNOSIS — Z87891 Personal history of nicotine dependence: Secondary | ICD-10-CM | POA: Insufficient documentation

## 2017-02-07 DIAGNOSIS — R0609 Other forms of dyspnea: Secondary | ICD-10-CM

## 2017-02-07 DIAGNOSIS — K219 Gastro-esophageal reflux disease without esophagitis: Secondary | ICD-10-CM | POA: Insufficient documentation

## 2017-02-07 DIAGNOSIS — J9611 Chronic respiratory failure with hypoxia: Secondary | ICD-10-CM | POA: Insufficient documentation

## 2017-02-07 DIAGNOSIS — I2721 Secondary pulmonary arterial hypertension: Secondary | ICD-10-CM | POA: Insufficient documentation

## 2017-02-07 DIAGNOSIS — R0600 Dyspnea, unspecified: Secondary | ICD-10-CM

## 2017-02-07 DIAGNOSIS — I872 Venous insufficiency (chronic) (peripheral): Secondary | ICD-10-CM | POA: Insufficient documentation

## 2017-02-07 DIAGNOSIS — E785 Hyperlipidemia, unspecified: Secondary | ICD-10-CM | POA: Insufficient documentation

## 2017-02-07 DIAGNOSIS — Z7982 Long term (current) use of aspirin: Secondary | ICD-10-CM | POA: Insufficient documentation

## 2017-02-07 DIAGNOSIS — J449 Chronic obstructive pulmonary disease, unspecified: Secondary | ICD-10-CM | POA: Insufficient documentation

## 2017-02-07 MED ORDER — POTASSIUM CHLORIDE CRYS ER 20 MEQ PO TBCR: 20.0000 meq | EXTENDED_RELEASE_TABLET | Freq: Two times a day (BID) | ORAL | 6 refills | Status: DC

## 2017-02-07 MED ORDER — TORSEMIDE 20 MG PO TABS: 80.0000 mg | ORAL_TABLET | Freq: Every day | ORAL | 3 refills | Status: DC

## 2017-02-07 NOTE — Progress Notes (Signed)
Advanced Heart Failure Medication Review by a Pharmacist  Does the patient  feel that his/her medications are working for him/her?  yes  Has the patient been experiencing any side effects to the medications prescribed?  no  Does the patient measure his/her own blood pressure or blood glucose at home?  yes   Does the patient have any problems obtaining medications due to transportation or finances?   Yes - has been out of Opsumit for 2.5-3 weeks due to lack of insurance, patient assistance program in process.   Understanding of regimen: good Understanding of indications: good Potential of compliance: good Patient understands to avoid NSAIDs. Patient understands to avoid decongestants.  Issues to address at subsequent visits: F/u Opsumit patient assistance, f/u new insurance coverage in April   Pharmacist comments: Kelly Donaldson is a pleasant 61 yo F presenting to advanced heart failure clinic with her husband without assistance today. She states that she has been out of her Opsumit and that her breathing is okay, but could be better if back on the medication. She asked if she could stop taking selexipag since she was unable to up-titrate due to joint pain intolerance, we discussed that even a small amount of this medication can be beneficial. She had no other concerns or questions about her medications.   Allie Bossier, PharmD PGY1 Pharmacy Resident [phone] (Pager) 02/07/2017 11:16 AM   Time with patient: 10 mins Preparation and documentation time: 2 mins Total time: 12 mins

## 2017-02-07 NOTE — Progress Notes (Signed)
Patient ID: Kelly Donaldson, female   DOB: 08-Nov-1956, 61 y.o.   MRN: [id number]   PCP: Prudy Feeler Pulmonology: Dr [name] Primary HF: Dr Shirlee Latch   61 yo with history of chronic hypoxemic respiratory failure and pulmonary hypertension.  She has been followed by Dr [name], now follows with Dr. [name] with pulmonology.  Has been on home oxygen for 6 years.  She quit smoking in 2016.  She had an echo done in 5/16 showing dilated RV with severe pulmonary hypertension.  She therefore had RHC in 5/16 which confirmed severe pulmonary hypertension and RV failure.  PFTs showed only mild obstruction and restriction with markedly decreased DLCO suggestive of pulmonary vascular disease.  There was concern for sarcoidosis on CT chest but lymph node biopsy showed no evidence for sarcoidosis.  V/Q scan showed no evidence for chronic PE.   She has not tolerated Revatio or Adcirca.  Have failed previous up-titration of Selexipag with pelvic pain, can tolerate 200 mcg bid.  She is now tolerating riociguat 1 mg tid.   Started torsemide in Oct. 2017, has had better diuresis. At home 02 sats range in upper 80s/low 90s with 02 via [place] 3-5Lpm.   She returns today for follow up. Had a stomach bug yesterday with nausea and vomiting, now resolved. No syncope or presyncope. No orthostatic symptoms or swelling. She is on 2 L o2 at home, can walk around the grocery store without SOB. Sometimes she will increase her o2 to 3L if she feels SOB. SOB with walking steps.  Had one episode of presyncope after carrying a heavy bag, did not pass out. Weight range 250 pounds-252 pounds. She has been out of Opsumit for 2 weeks, pharmacy will follow up. Taking all medications.    6 minute walk (5/16): 238 meters => decreased oxygen saturation to 73%, increased to 6 L [place] 6 minute walk (7/16): 170 meters => unable to complete.  6 minute walk (9/16): 201 meters 6 minute walk (1/17): 256 meters 6 minute walk (4/17): 219 meters 6 minute walk (8/17):  225 meters 6 minute walk (11/17): 244 meters  Labs (2/16): BNP 69 Labs (5/16): LFTs normal, HCT 34.9, RF negative, HIV negative, TSH negative Labs (6/16): K 4.4, creatinine 0.88 Labs (7/16): ANA negative, BNP 58, K 4.3, creatinine 1.1, anti-RNP negative Labs (9/16): K 4.4, creatinine 1.26, BNP 91 Labs (10/16): K 4.3, creatinine 1.04 Labs (12/16): K 4.2, creatinine 1.07, BNP 130 Labs (1/17): K 4.3, creatinine 1.23, BNP 42 Labs (3/17): K 4.2, creatinine 1.19 Labs (5/17): K 4.3, creatinine 1.35, BNP 44 Labs (6/17): K 4.4, creatinine 1.18, BNP 44 Labs (10/17): K 4.2, creatinine 0.98, BNP 43 Labs (12/17): K 4.5, creatinine 1.23. BNP 64.5 . PMH: 1. Pulmonary hypertension: RHC (5/16) with mean RA 18, PA 83/33 mean 53, mean PCWP 19, CI 2.61, PVR 5.8 WU.   Echo (5/16) with EF 65-70%, septal flattening, dilated RV with PA systolic pressure 99 mmHg, +bubble study.  PFTs (4/16) with FVC 76%, FEV1 75%, ratio 97%, TLC 66%, DLCO 26% => mild restriction, mild obstruction, severe diffuse abnormality (pulmonary vascular problem).  CTA chest (4/16) with chronic interstitial and obstructive lung disease, small mediastinal and hilar lymph noes, no PE.  Lymph node biopsy negative for sarcoidosis (reactive changes).  V/Q scan (6/16): No acute or chronic PE. TEE (6/16): Normal LV size and systolic function, EF 55-60%,RV was mildly dilated with mildly decreased systolic function, D-shaped interventricular septum suggestive of RV pressure/volume overload, there appeared to be a  small PFO present with some bubbles crossing, no ASD.  ANA negative, anti-RNP negative, HIV negative. Sleep study (8/16) without OSA.  She did not tolerate Adcirca or Revatio.  She cannot tolerate more than 200 mcg bid Selexipag.  - Echo (6/17): EF 60-65%, normal diastolic function, D-shaped interventricular septum, mildly dilated RV with normal RV systolic function, PASP 39, IVC normal.  2. Chronic venous insufficiency. 3. Hyperlipidemia.  4.  GERD 5. Chronic hypoxemic respiratory failure: Hypoxemia noted x 6 years at least.  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   SH: Married, quit smoking in 3/16.  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    FH: No pulmonary hypertension, no premature CAD, no sudden death.   ROS: All systems reviewed and negative except as per HPI.    Current Outpatient Prescriptions  Medication Sig Dispense Refill  . amoxicillin (AMOXIL) 500 MG capsule Take 2 capsules (1,000 mg total) by mouth 2 (two) times daily. 40 capsule 0  . aspirin EC 81 MG tablet Take 1 tablet (81 mg total) by mouth daily. 90 tablet 3  . cetirizine (ZYRTEC) 10 MG tablet Take 10 mg by mouth daily.    . citalopram (CELEXA) 40 MG tablet Take 40 mg by mouth daily.    Marland Kitchen HYDROcodone-acetaminophen (NORCO) 10-325 MG tablet Take 1-2 tablets by mouth every 6 (six) hours as needed. 240 tablet 0  . HYDROcodone-acetaminophen (NORCO) 10-325 MG tablet Take 1-2 tablets by mouth every 6 (six) hours as needed. 240 tablet 0  . HYDROcodone-acetaminophen (NORCO) 10-325 MG tablet Take 1-2 tablets by mouth every 6 (six) hours as needed. 240 tablet 0  . HYDROcodone-homatropine (HYCODAN) 5-1.5 MG/5ML syrup Take 5-10 mLs by mouth every 6 (six) hours as needed for cough. 240 mL 0  . Ipratropium-Albuterol (COMBIVENT RESPIMAT) 20-100 MCG/ACT AERS respimat Inhale 1 puff into the lungs every 6 (six) hours as needed for wheezing. 1 Inhaler 5  . ipratropium-albuterol (DUONEB) 0.5-2.5 (3) MG/3ML SOLN Take 3 mLs by nebulization 4 (four) times daily as needed. 360 mL 11  . Macitentan (OPSUMIT) 10 MG TABS Take 1 tablet by mouth daily. 90 tablet 3  . OXYGEN 2.5 lpm with sleep and 4 lpm with exertion  [facility]    . potassium chloride SA (K-DUR,KLOR-CON) 20 MEQ tablet Take 1 tablet (20 mEq total) by mouth 2 (two) times daily. 60 tablet 6  . pravastatin (PRAVACHOL) 20 MG tablet Take 20 mg by mouth daily.    . Riociguat (ADEMPAS)  1.5 MG TABS Take 2 mg by mouth 3 (three) times daily. 90 tablet 3  . Selexipag 200 MCG TABS Take 1 tablet (200 mcg total) by mouth 2 (two) times daily. 60 tablet 6  . torsemide (DEMADEX) 20 MG tablet Take 4 tablets (80 mg total) by mouth daily. 120 tablet 3  . Vitamin D, Ergocalciferol, (DRISDOL) 50000 units CAPS capsule TAKE ONE CAPSULE BY MOUTH ONCE WEEKLY FOR 30 DAYS 4 capsule 6  . zolpidem (AMBIEN) 5 MG tablet Take 1 tablet (5 mg total) by mouth at bedtime. 30 tablet 5   No current facility-administered medications for this encounter.    BP (!) 100/52 (BP Location: Right Arm, Patient Position: Sitting, Cuff Size: Large)   Pulse 79   Wt 250 lb 9.6 oz (113.7 kg)   SpO2 (!) 89% Comment: on 4L  BMI 39.25 kg/m   Wt Readings from Last 3 Encounters:  02/07/17 250 lb 9.6 oz (113.7 kg)  12/13/16 256 lb 12.8  oz (116.5 kg)  11/26/16 250 lb (113.4 kg)     General: NAD. Wearing oxygen. Walked in the clinic. Husband present.  Neck: Thick, JVP flat.  No thyromegaly or thyroid nodule. No carotid bruit. Lungs: Clear, normal effort. Breathing swallow.  CV: Nondisplaced PMI.  Heart regular S1/S2, no S3, 2/6 HSM LLSB.      Abdomen: Soft, NT, ND, no HSM. No bruits or masses. +BS  Skin: Intact without lesions or rashes.  Neurologic: Alert and oriented x 3.  Psych: Normal Affect.  Extremities: No clubbing or cyanosis.No edema.  HEENT: Normal.  Assessment/Plan:  1. Pulmonary arterial hypertension with RV failure: Severe on prior RHC. 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  There was concern from CTA chest for sarcoidosis, but lymph node biopsy was negative.  LFTs normal, ANA negative, anti-RNP negative, HIV negative.  V/Q scan negative for chronic PE.  Small PFO but no ASD on TEE.  No OSA on sleep study.  She did not tolerate Revatio due to joint pain (now resolved), and she did not tolerate Adcirca with worsening  dyspnea.  She does not tolerate more than 200 mcg bid Selexipag due to pelvic pain.  Echo 6/17 showed mildly dilated RV with normal systolic function and PASP appeared lower at 39 mmHg. Per Dr Birdie Riddle has looked better with [facility] treatment, but her symptoms and 6 minute walk have been fairly unchanged (6 minute walk a bit better last visit).  - Volume status looks OK on exam. NYHA III - Open lung biopsy deemed too risky, probably not sarcoid.   - Continue torsemide 80 mg daily. Can take extra 20 for 3-4 pound weight gain.   - has been out of the Opsumit for 2 weeks, as a result her breathing has been more labored. Pharmacy working with her to get opsumit.  - Continue Selexipag at 200 mcg BID and do not titrate up given joint pain.  - Continue riociguat 1.5 mg TID.  - Reinforced fluid restriction to < 2 L daily, sodium restriction to less than 2000 mg daily, and the importance of daily weights.  2. Chronic hypoxemic respiratory failure: Chronic interstitial and obstructive lung disease, mediastinal and hilar lymphadenopathy.  Concern for sarcoidosis.  PFTs showed only mild restriction and obstruction.  Biopsy did not show sarcoid.  Deemed too high risk for open lung biopsy.  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 - Continue oxygen up to 5 L with activity, 3L at rest.  - Sees pulmonary, Dr [name].   She would like to defer 6 min walk today as she had a stomach bug yesterday and feels fatigued.  Follow up in 6 weeks with [name]. Will need 6 min walk and BMP next visit. Referred to pulmonary rehab in [place] at [facility], will work on approval.   Greater than 50% of the (total minutes 25) visit spent in counseling/coordination of care regarding the above recommedations for PAH.    Tonye Becket, NP-C  02/07/2017

## 2017-02-07 NOTE — Patient Instructions (Signed)
Pulmonary Rehab at [facility] has been ordered for you, they will contact you for initial appointment.  Follow up in 6 Weeks.

## 2017-02-10 ENCOUNTER — Other Ambulatory Visit (HOSPITAL_COMMUNITY): Payer: Self-pay | Admitting: *Deleted

## 2017-02-10 DIAGNOSIS — I270 Primary pulmonary hypertension: Secondary | ICD-10-CM

## 2017-02-10 MED ORDER — MACITENTAN 10 MG PO TABS: 1.0000 | ORAL_TABLET | Freq: Every day | ORAL | 3 refills | Status: DC

## 2017-02-11 ENCOUNTER — Other Ambulatory Visit (HOSPITAL_COMMUNITY): Payer: Self-pay

## 2017-02-11 DIAGNOSIS — I270 Primary pulmonary hypertension: Secondary | ICD-10-CM

## 2017-02-11 MED ORDER — MACITENTAN 10 MG PO TABS
1.0000 | ORAL_TABLET | Freq: Every day | ORAL | 11 refills | Status: DC
Start: 2017-02-11 — End: 2017-03-04

## 2017-02-24 ENCOUNTER — Other Ambulatory Visit (HOSPITAL_COMMUNITY): Payer: Self-pay | Admitting: Pharmacist

## 2017-02-24 MED ORDER — RIOCIGUAT 2 MG PO TABS: 2.0000 mg | ORAL_TABLET | Freq: Three times a day (TID) | ORAL | 11 refills | Status: DC

## 2017-02-26 ENCOUNTER — Other Ambulatory Visit (HOSPITAL_COMMUNITY): Payer: Self-pay

## 2017-02-26 DIAGNOSIS — I2721 Secondary pulmonary arterial hypertension: Secondary | ICD-10-CM

## 2017-02-26 NOTE — Addendum Note (Signed)
Addended by: Chyrl Civatte on: 02/26/2017 02:39 PM   Modules accepted: Orders

## 2017-03-03 ENCOUNTER — Encounter: Payer: Self-pay | Admitting: Physician Assistant

## 2017-03-03 ENCOUNTER — Ambulatory Visit (INDEPENDENT_AMBULATORY_CARE_PROVIDER_SITE_OTHER): Payer: Medicare PPO | Admitting: Physician Assistant

## 2017-03-03 VITALS — BP 116/62 | HR 68 | Temp 97.9°F | Ht 67.0 in | Wt 254.0 lb

## 2017-03-03 DIAGNOSIS — I2729 Other secondary pulmonary hypertension: Secondary | ICD-10-CM

## 2017-03-03 DIAGNOSIS — D869 Sarcoidosis, unspecified: Secondary | ICD-10-CM

## 2017-03-03 DIAGNOSIS — J849 Interstitial pulmonary disease, unspecified: Secondary | ICD-10-CM | POA: Diagnosis not present

## 2017-03-03 DIAGNOSIS — I5032 Chronic diastolic (congestive) heart failure: Secondary | ICD-10-CM

## 2017-03-03 DIAGNOSIS — M5136 Other intervertebral disc degeneration, lumbar region: Secondary | ICD-10-CM | POA: Diagnosis not present

## 2017-03-03 MED ORDER — POTASSIUM CHLORIDE CRYS ER 20 MEQ PO TBCR: 20.0000 meq | EXTENDED_RELEASE_TABLET | Freq: Two times a day (BID) | ORAL | 3 refills | Status: DC

## 2017-03-03 MED ORDER — HYDROCODONE-ACETAMINOPHEN 10-325 MG PO TABS: 1.0000 | ORAL_TABLET | Freq: Four times a day (QID) | ORAL | 0 refills | Status: DC | PRN

## 2017-03-03 MED ORDER — ZOLPIDEM TARTRATE 5 MG PO TABS: 5.0000 mg | ORAL_TABLET | Freq: Every day | ORAL | 5 refills | Status: DC

## 2017-03-03 MED ORDER — IPRATROPIUM-ALBUTEROL 20-100 MCG/ACT IN AERS: 1.0000 | INHALATION_SPRAY | Freq: Four times a day (QID) | RESPIRATORY_TRACT | 3 refills | Status: DC | PRN

## 2017-03-03 MED ORDER — CETIRIZINE HCL 10 MG PO TABS: 10.0000 mg | ORAL_TABLET | Freq: Every day | ORAL | 3 refills | Status: DC

## 2017-03-03 MED ORDER — PRAVASTATIN SODIUM 20 MG PO TABS: 20.0000 mg | ORAL_TABLET | Freq: Every day | ORAL | 3 refills | Status: DC

## 2017-03-03 MED ORDER — IPRATROPIUM-ALBUTEROL 0.5-2.5 (3) MG/3ML IN SOLN: 3.0000 mL | Freq: Four times a day (QID) | RESPIRATORY_TRACT | 11 refills | Status: DC | PRN

## 2017-03-03 MED ORDER — TORSEMIDE 20 MG PO TABS: 80.0000 mg | ORAL_TABLET | Freq: Every day | ORAL | 3 refills | Status: DC

## 2017-03-03 MED ORDER — VITAMIN D (ERGOCALCIFEROL) 1.25 MG (50000 UNIT) PO CAPS: ORAL_CAPSULE | ORAL | 3 refills | Status: DC

## 2017-03-03 MED ORDER — CITALOPRAM HYDROBROMIDE 40 MG PO TABS: 40.0000 mg | ORAL_TABLET | Freq: Every day | ORAL | 3 refills | Status: DC

## 2017-03-03 MED ORDER — AZITHROMYCIN 200 MG/5ML PO SUSR: ORAL | 0 refills | Status: DC

## 2017-03-03 NOTE — Progress Notes (Signed)
BP 116/62   Pulse 68   Temp 97.9 F (36.6 C) (Oral)   Ht 5\' 7"  (1.702 m)   Wt 254 lb (115.2 kg)   BMI 39.78 kg/m    Subjective:    Patient ID: Kelly Donaldson, female    DOB: March 06, 1956, 61 y.o.   MRN: [id number]  HPI: Kelly Donaldson is a 61 y.o. female presenting on 03/03/2017 for Medication Refill; Cough; and Chest congestion  This patient comes in for periodic recheck on medications and conditions including Degenerative disc disease and chronic pain, pulmonary hypertension, chronic diastolic heart failure, interstitial lung disease, pulmonary hypertension. She is still seen her pulmonologist. In May she'll start cardiopulmonary rehabilitation at [facility]. She has had increased congestion over last few days. She had some dyspnea over the weekend. There is slight amount of production that is green. She denies any blood.  Pain assessment: Cause of pain- DDD Pain location- back and legs Pain on scale of 1-10- 8 Frequency-daily What increases pain-walking, standing What makes pain Better-medication Effects on ADL - moderate Any change in general medical condition-stable  Current medications- hydrocodone 10/325 1-2 tab QID Effectiveness of current meds-good Adverse reactions form pain meds-none  Pill count performed-No Urine drug screen- No Was the [facility] reviewed- no, lives in [place]  If yes were their any concerning findings? - n/a .   All medications are reviewed today. There are no reports of any problems with the medications. All of the medical conditions are reviewed and updated.  Lab work is reviewed and will be ordered as medically necessary. There are no new problems reported with today's visit.   Relevant past medical, surgical, family and social history reviewed and updated as indicated. Allergies and medications reviewed and updated.  Past Medical History:  Diagnosis Date  . Allergy   . Depression   . GERD (gastroesophageal reflux disease)   .  Hyperlipidemia   . Oxygen dependent   . Shortness of breath dyspnea   . Varicose veins     Past Surgical History:  Procedure Laterality Date  . CARDIAC CATHETERIZATION N/A 04/20/2015   Procedure: Right Heart Cath;  Surgeon: Laurey Morale, MD;  Location: [facility] INVASIVE CV LAB;  Service: Cardiovascular;  Laterality: N/A;  . TEE WITHOUT CARDIOVERSION N/A 05/11/2015   Procedure: TRANSESOPHAGEAL ECHOCARDIOGRAM (TEE);  Surgeon: Laurey Morale, MD;  Location: [facility] ENDOSCOPY;  Service: Cardiovascular;  Laterality: N/A;  . TUBAL LIGATION     11/84    Review of Systems  Constitutional: Negative.  Negative for activity change, fatigue and fever.  HENT: Positive for congestion.   Eyes: Negative.   Respiratory: Positive for cough, shortness of breath and wheezing.   Cardiovascular: Negative.  Negative for chest pain.  Gastrointestinal: Negative.  Negative for abdominal pain.  Endocrine: Negative.   Genitourinary: Negative.  Negative for dysuria.  Musculoskeletal: Positive for arthralgias, back pain, gait problem and myalgias.  Skin: Negative.   Neurological: Negative for dizziness.    Allergies as of 03/03/2017      Reactions   Gabapentin    Pt states that she can not take with antidepressants   Latex Rash      Medication List       Accurate as of 03/03/17  2:48 PM. Always use your most recent med list.          aspirin EC 81 MG tablet Take 1 tablet (81 mg total) by mouth daily.   azithromycin 200 MG/5ML suspension Commonly known  as:  ZITHROMAX Take 2 tsp Day 1, 1 tsp Day 2-5.   cetirizine 10 MG tablet Commonly known as:  ZYRTEC Take 1 tablet (10 mg total) by mouth daily.   citalopram 40 MG tablet Commonly known as:  CELEXA Take 1 tablet (40 mg total) by mouth daily.   HYDROcodone-acetaminophen 10-325 MG tablet Commonly known as:  NORCO Take 1-2 tablets by mouth every 6 (six) hours as needed.   HYDROcodone-acetaminophen 10-325 MG tablet Commonly known as:  NORCO Take 1-2  tablets by mouth every 6 (six) hours as needed.   HYDROcodone-acetaminophen 10-325 MG tablet Commonly known as:  NORCO Take 1-2 tablets by mouth every 6 (six) hours as needed.   ipratropium-albuterol 0.5-2.5 (3) MG/3ML Soln Commonly known as:  DUONEB Take 3 mLs by nebulization 4 (four) times daily as needed.   Ipratropium-Albuterol 20-100 MCG/ACT Aers respimat Commonly known as:  COMBIVENT RESPIMAT Inhale 1 puff into the lungs every 6 (six) hours as needed for wheezing.   Macitentan 10 MG Tabs Commonly known as:  OPSUMIT Take 1 tablet (10 mg total) by mouth daily.   OXYGEN 2.5 lpm with sleep and 4 lpm with exertion  [facility]   potassium chloride SA 20 MEQ tablet Commonly known as:  K-DUR,KLOR-CON Take 1 tablet (20 mEq total) by mouth 2 (two) times daily.   pravastatin 20 MG tablet Commonly known as:  PRAVACHOL Take 1 tablet (20 mg total) by mouth daily.   Riociguat 2 MG Tabs Commonly known as:  ADEMPAS Take 2 mg by mouth 3 (three) times daily.   Selexipag 200 MCG Tabs Take 1 tablet (200 mcg total) by mouth 2 (two) times daily.   torsemide 20 MG tablet Commonly known as:  DEMADEX Take 4 tablets (80 mg total) by mouth daily.   Vitamin D (Ergocalciferol) 50000 units Caps capsule Commonly known as:  DRISDOL TAKE ONE CAPSULE BY MOUTH ONCE WEEKLY FOR 30 DAYS   zolpidem 5 MG tablet Commonly known as:  AMBIEN Take 1 tablet (5 mg total) by mouth at bedtime.          Objective:    BP 116/62   Pulse 68   Temp 97.9 F (36.6 C) (Oral)   Ht 5\' 7"  (1.702 m)   Wt 254 lb (115.2 kg)   BMI 39.78 kg/m   Allergies  Allergen Reactions  . Gabapentin     Pt states that she can not take with antidepressants  . Latex Rash    Physical Exam  Constitutional: She is oriented to person, place, and time. She appears well-developed and well-nourished.  HENT:  Head: Normocephalic and atraumatic.  Right Ear: There is drainage and tenderness.  Left Ear: There is drainage and  tenderness.  Nose: Mucosal edema and rhinorrhea present. Right sinus exhibits maxillary sinus tenderness and frontal sinus tenderness. Left sinus exhibits maxillary sinus tenderness and frontal sinus tenderness.  Mouth/Throat: Oropharyngeal exudate and posterior oropharyngeal erythema present.  Eyes: Conjunctivae and EOM are normal. Pupils are equal, round, and reactive to light.  Neck: Normal range of motion. Neck supple.  Cardiovascular: Normal rate, regular rhythm, normal heart sounds and intact distal pulses.   Pulmonary/Chest: Effort normal. She has wheezes in the right upper field and the left upper field.  Abdominal: Soft. Bowel sounds are normal.  Neurological: She is alert and oriented to person, place, and time. She has normal reflexes.  Skin: Skin is warm and dry. No rash noted.  Psychiatric: She has a normal mood and affect. Her  behavior is normal. Judgment and thought content normal.  Nursing note and vitals reviewed.       Assessment & Plan:   1. DDD (degenerative disc disease), lumbar - HYDROcodone-acetaminophen (NORCO) 10-325 MG tablet; Take 1-2 tablets by mouth every 6 (six) hours as needed.  Dispense: 240 tablet; Refill: 0 - HYDROcodone-acetaminophen (NORCO) 10-325 MG tablet; Take 1-2 tablets by mouth every 6 (six) hours as needed.  Dispense: 240 tablet; Refill: 0 - HYDROcodone-acetaminophen (NORCO) 10-325 MG tablet; Take 1-2 tablets by mouth every 6 (six) hours as needed.  Dispense: 240 tablet; Refill: 0  2. Pulmonary hypertension associated with sarcoidosis (HCC)  3. Chronic diastolic CHF (congestive heart failure) (HCC)  4. ILD (interstitial lung disease) (HCC)   Current Outpatient Prescriptions:  .  aspirin EC 81 MG tablet, Take 1 tablet (81 mg total) by mouth daily., Disp: 90 tablet, Rfl: 3 .  cetirizine (ZYRTEC) 10 MG tablet, Take 1 tablet (10 mg total) by mouth daily., Disp: 90 tablet, Rfl: 3 .  citalopram (CELEXA) 40 MG tablet, Take 1 tablet (40 mg total) by  mouth daily., Disp: 90 tablet, Rfl: 3 .  HYDROcodone-acetaminophen (NORCO) 10-325 MG tablet, Take 1-2 tablets by mouth every 6 (six) hours as needed., Disp: 240 tablet, Rfl: 0 .  HYDROcodone-acetaminophen (NORCO) 10-325 MG tablet, Take 1-2 tablets by mouth every 6 (six) hours as needed., Disp: 240 tablet, Rfl: 0 .  HYDROcodone-acetaminophen (NORCO) 10-325 MG tablet, Take 1-2 tablets by mouth every 6 (six) hours as needed., Disp: 240 tablet, Rfl: 0 .  Ipratropium-Albuterol (COMBIVENT RESPIMAT) 20-100 MCG/ACT AERS respimat, Inhale 1 puff into the lungs every 6 (six) hours as needed for wheezing., Disp: 3 Inhaler, Rfl: 3 .  ipratropium-albuterol (DUONEB) 0.5-2.5 (3) MG/3ML SOLN, Take 3 mLs by nebulization 4 (four) times daily as needed., Disp: 360 mL, Rfl: 11 .  Macitentan (OPSUMIT) 10 MG TABS, Take 1 tablet (10 mg total) by mouth daily., Disp: 30 tablet, Rfl: 11 .  OXYGEN, 2.5 lpm with sleep and 4 lpm with exertion  [facility], Disp: , Rfl:  .  potassium chloride SA (K-DUR,KLOR-CON) 20 MEQ tablet, Take 1 tablet (20 mEq total) by mouth 2 (two) times daily., Disp: 180 tablet, Rfl: 3 .  pravastatin (PRAVACHOL) 20 MG tablet, Take 1 tablet (20 mg total) by mouth daily., Disp: 90 tablet, Rfl: 3 .  Riociguat (ADEMPAS) 2 MG TABS, Take 2 mg by mouth 3 (three) times daily., Disp: 90 tablet, Rfl: 11 .  Selexipag 200 MCG TABS, Take 1 tablet (200 mcg total) by mouth 2 (two) times daily., Disp: 60 tablet, Rfl: 6 .  torsemide (DEMADEX) 20 MG tablet, Take 4 tablets (80 mg total) by mouth daily., Disp: 360 tablet, Rfl: 3 .  Vitamin D, Ergocalciferol, (DRISDOL) 50000 units CAPS capsule, TAKE ONE CAPSULE BY MOUTH ONCE WEEKLY FOR 30 DAYS, Disp: 12 capsule, Rfl: 3 .  zolpidem (AMBIEN) 5 MG tablet, Take 1 tablet (5 mg total) by mouth at bedtime., Disp: 30 tablet, Rfl: 5 .  azithromycin (ZITHROMAX) 200 MG/5ML suspension, Take 2 tsp Day 1, 1 tsp Day 2-5., Disp: 30 mL, Rfl: 0  Continue all other maintenance medications as  listed above.  Follow up plan: Return in about 3 months (around 06/03/2017) for recheck meds.  Educational handout given for insomnia   Remus Loffler PA-C [facility] [address]  [place], [place] [address] [phone]   03/03/2017, 2:48 PM

## 2017-03-03 NOTE — Patient Instructions (Signed)
Insomnia Insomnia is a sleep disorder that makes it difficult to fall asleep or to stay asleep. Insomnia can cause tiredness (fatigue), low energy, difficulty concentrating, mood swings, and poor performance at work or school. There are three different ways to classify insomnia:  Difficulty falling asleep.  Difficulty staying asleep.  Waking up too early in the morning. Any type of insomnia can be long-term (chronic) or short-term (acute). Both are common. Short-term insomnia usually lasts for three months or less. Chronic insomnia occurs at least three times a week for longer than three months. What are the causes? Insomnia may be caused by another condition, situation, or substance, such as:  Anxiety.  Certain medicines.  Gastroesophageal reflux disease (GERD) or other gastrointestinal conditions.  Asthma or other breathing conditions.  Restless legs syndrome, sleep apnea, or other sleep disorders.  Chronic pain.  Menopause. This may include hot flashes.  Stroke.  Abuse of alcohol, tobacco, or illegal drugs.  Depression.  Caffeine.  Neurological disorders, such as Alzheimer disease.  An overactive thyroid (hyperthyroidism). The cause of insomnia may not be known. What increases the risk? Risk factors for insomnia include:  Gender. Women are more commonly affected than men.  Age. Insomnia is more common as you get older.  Stress. This may involve your professional or personal life.  Income. Insomnia is more common in people with lower income.  Lack of exercise.  Irregular work schedule or night shifts.  Traveling between different time zones. What are the signs or symptoms? If you have insomnia, trouble falling asleep or trouble staying asleep is the main symptom. This may lead to other symptoms, such as:  Feeling fatigued.  Feeling nervous about going to sleep.  Not feeling rested in the morning.  Having trouble concentrating.  Feeling irritable,  anxious, or depressed. How is this treated? Treatment for insomnia depends on the cause. If your insomnia is caused by an underlying condition, treatment will focus on addressing the condition. Treatment may also include:  Medicines to help you sleep.  Counseling or therapy.  Lifestyle adjustments. Follow these instructions at home:  Take medicines only as directed by your health care provider.  Keep regular sleeping and waking hours. Avoid naps.  Keep a sleep diary to help you and your health care provider figure out what could be causing your insomnia. Include:  When you sleep.  When you wake up during the night.  How well you sleep.  How rested you feel the next day.  Any side effects of medicines you are taking.  What you eat and drink.  Make your bedroom a comfortable place where it is easy to fall asleep:  Put up shades or special blackout curtains to block light from outside.  Use a white noise machine to block noise.  Keep the temperature cool.  Exercise regularly as directed by your health care provider. Avoid exercising right before bedtime.  Use relaxation techniques to manage stress. Ask your health care provider to suggest some techniques that may work well for you. These may include:  Breathing exercises.  Routines to release muscle tension.  Visualizing peaceful scenes.  Cut back on alcohol, caffeinated beverages, and cigarettes, especially close to bedtime. These can disrupt your sleep.  Do not overeat or eat spicy foods right before bedtime. This can lead to digestive discomfort that can make it hard for you to sleep.  Limit screen use before bedtime. This includes:  Watching TV.  Using your smartphone, tablet, and computer.  Stick to a   routine. This can help you fall asleep faster. Try to do a quiet activity, brush your teeth, and go to bed at the same time each night.  Get out of bed if you are still awake after 15 minutes of trying to  sleep. Keep the lights down, but try reading or doing a quiet activity. When you feel sleepy, go back to bed.  Make sure that you drive carefully. Avoid driving if you feel very sleepy.  Keep all follow-up appointments as directed by your health care provider. This is important. Contact a health care provider if:  You are tired throughout the day or have trouble in your daily routine due to sleepiness.  You continue to have sleep problems or your sleep problems get worse. Get help right away if:  You have serious thoughts about hurting yourself or someone else. This information is not intended to replace advice given to you by your health care provider. Make sure you discuss any questions you have with your health care provider. Document Released: 11/22/2000 Document Revised: 04/26/2016 Document Reviewed: 08/26/2014 Elsevier Interactive Patient Education  2017 Elsevier Inc.  

## 2017-03-04 ENCOUNTER — Telehealth (HOSPITAL_COMMUNITY): Payer: Self-pay | Admitting: Pharmacist

## 2017-03-04 ENCOUNTER — Other Ambulatory Visit (HOSPITAL_COMMUNITY): Payer: Self-pay | Admitting: Pharmacist

## 2017-03-04 DIAGNOSIS — I270 Primary pulmonary hypertension: Secondary | ICD-10-CM

## 2017-03-04 MED ORDER — MACITENTAN 10 MG PO TABS: 1.0000 | ORAL_TABLET | Freq: Every day | ORAL | 11 refills | Status: DC

## 2017-03-04 NOTE — Telephone Encounter (Signed)
Ms. Callicott was able to enroll in the [employer] for assistance with her PAH medications. She has $5300 to use toward her copay costs for these medications through 03/03/18.   Tyler Deis. Bonnye Fava, PharmD, BCPS, CPP Clinical Pharmacist Pager: [phone] Phone: [phone] 03/04/2017 2:41 PM

## 2017-03-05 ENCOUNTER — Other Ambulatory Visit (HOSPITAL_COMMUNITY): Payer: Self-pay | Admitting: Cardiology

## 2017-03-05 ENCOUNTER — Telehealth (HOSPITAL_COMMUNITY): Payer: Self-pay

## 2017-03-05 DIAGNOSIS — I270 Primary pulmonary hypertension: Secondary | ICD-10-CM

## 2017-03-05 NOTE — Telephone Encounter (Signed)
[employer] approved Opsumit for 30 days but denied 90 day coverage.   Allie Bossier, PharmD PGY1 Pharmacy Resident [phone] (Pager) 03/05/2017 10:28 AM

## 2017-03-06 ENCOUNTER — Other Ambulatory Visit: Payer: Self-pay | Admitting: *Deleted

## 2017-03-06 MED ORDER — PRAVASTATIN SODIUM 20 MG PO TABS: 20.0000 mg | ORAL_TABLET | Freq: Every day | ORAL | 3 refills | Status: DC

## 2017-03-07 ENCOUNTER — Telehealth (HOSPITAL_COMMUNITY): Payer: Self-pay | Admitting: *Deleted

## 2017-03-07 NOTE — Telephone Encounter (Signed)
[facility] pharmacy called to verify patient's Adempas dose being 2 mg Tablets TID.    Called back and verified that is the correct dose.  No further questions.

## 2017-03-14 ENCOUNTER — Other Ambulatory Visit: Payer: Self-pay | Admitting: Physician Assistant

## 2017-03-17 NOTE — Telephone Encounter (Signed)
Rx called to pharmacy

## 2017-03-21 ENCOUNTER — Encounter (HOSPITAL_COMMUNITY): Payer: Self-pay

## 2017-03-21 ENCOUNTER — Ambulatory Visit (HOSPITAL_COMMUNITY)
Admission: RE | Admit: 2017-03-21 | Discharge: 2017-03-21 | Disposition: A | Discharge status: Home / Self Care | Payer: Medicare PPO | Source: Ambulatory Visit | Attending: Cardiology | Admitting: Cardiology

## 2017-03-21 VITALS — BP 100/52 | HR 77 | Wt 254.2 lb

## 2017-03-21 DIAGNOSIS — I872 Venous insufficiency (chronic) (peripheral): Secondary | ICD-10-CM | POA: Insufficient documentation

## 2017-03-21 DIAGNOSIS — I2721 Secondary pulmonary arterial hypertension: Secondary | ICD-10-CM | POA: Diagnosis not present

## 2017-03-21 DIAGNOSIS — J9611 Chronic respiratory failure with hypoxia: Secondary | ICD-10-CM | POA: Insufficient documentation

## 2017-03-21 DIAGNOSIS — R590 Localized enlarged lymph nodes: Secondary | ICD-10-CM | POA: Insufficient documentation

## 2017-03-21 DIAGNOSIS — Z7982 Long term (current) use of aspirin: Secondary | ICD-10-CM | POA: Diagnosis not present

## 2017-03-21 DIAGNOSIS — E785 Hyperlipidemia, unspecified: Secondary | ICD-10-CM | POA: Insufficient documentation

## 2017-03-21 DIAGNOSIS — I5032 Chronic diastolic (congestive) heart failure: Secondary | ICD-10-CM

## 2017-03-21 DIAGNOSIS — Z87891 Personal history of nicotine dependence: Secondary | ICD-10-CM | POA: Insufficient documentation

## 2017-03-21 LAB — BASIC METABOLIC PANEL
ANION GAP: 12 (ref 5–15)
BUN: 22 mg/dL — AB (ref 6–20)
CALCIUM: 9 mg/dL (ref 8.9–10.3)
CHLORIDE: 101 mmol/L (ref 101–111)
CO2: 26 mmol/L (ref 22–32)
Creatinine, Ser: 1.24 mg/dL — ABNORMAL HIGH (ref 0.44–1.00)
GFR, EST AFRICAN AMERICAN: 53 mL/min — AB (ref >60)
GFR, EST NON AFRICAN AMERICAN: 46 mL/min — AB (ref >60)
GLUCOSE: 148 mg/dL — AB (ref 65–99)
POTASSIUM: 4.1 mmol/L (ref 3.5–5.1)
SODIUM: 139 mmol/L (ref 135–145)

## 2017-03-21 LAB — BRAIN NATRIURETIC PEPTIDE: B Natriuretic Peptide: 25.9 pg/mL (ref 0.0–100.0)

## 2017-03-21 NOTE — Patient Instructions (Signed)
Routine lab work today. Will notify you of abnormal results  Follow up and Echo in June with Dr.McLean

## 2017-03-21 NOTE — Progress Notes (Signed)
Patient ID: Kelly Donaldson, female   DOB: 12-24-1955, 61 y.o.   MRN: [id number]   PCP: Prudy Feeler Pulmonology: Dr [name] Primary HF: Dr Shirlee Latch   Kelly Donaldson is a 61 year old with history of chronic hypoxemic respiratory failure and pulmonary hypertension.  Kelly Donaldson has been followed by Dr [name], now follows with Dr. [name] with pulmonology.  Has been on home oxygen for 6 years.  Kelly Donaldson quit smoking in 2016.  Kelly Donaldson had an echo done in 5/16 showing dilated RV with severe pulmonary hypertension.  Kelly Donaldson therefore had RHC in 5/16 which confirmed severe pulmonary hypertension and RV failure.  PFTs showed only mild obstruction and restriction with markedly decreased DLCO suggestive of pulmonary vascular disease.  There was concern for sarcoidosis on CT chest but lymph node biopsy showed no evidence for sarcoidosis.  V/Q scan showed no evidence for chronic PE.   Kelly Donaldson has not tolerated Revatio or Adcirca.  Have failed previous up-titration of Selexipag with pelvic pain, can tolerate 200 mcg bid.  Kelly Donaldson is now tolerating riociguat 1 mg tid.   Started torsemide in Oct. 2017, has had better diuresis. At home 02 sats range in upper 80s/low 90s with 02 via [place] 3-5Lpm.   Today Kelly Donaldson returns for [facility] follow up. Overall feeling ok. SOB with exertion but improves with rest breaks. Able to walk around the grocery store. Denies PND/Orthopnea. Denies syncope/presyncope.  No fever or chills. Weight at home 252-254 pounds. Kelly Donaldson continues on 5 liters oxygen with exertion and 2 liters at rest.  Taking all medications. Kelly Donaldson  is receiving all PAH meds without difficulty. Plan to start Pulmonary Rehab, Kelly Donaldson will have a 6 minute walk at pulmonary rehab so deferred today. Kelly Donaldson was unable to tolerate Adempas 2.5 mg tid so is taking it 2 mg tid.  6 minute walk (5/16): 238 meters => decreased oxygen saturation to 73%, increased to 6 L [place] 6 minute walk (7/16): 170 meters => unable to complete.  6 minute walk (9/16): 201 meters 6 minute walk (1/17): 256 meters 6  minute walk (4/17): 219 meters 6 minute walk (8/17): 225 meters 6 minute walk (11/17): 244 meters  Labs (2/16): BNP 69 Labs (5/16): LFTs normal, HCT 34.9, RF negative, HIV negative, TSH negative Labs (6/16): K 4.4, creatinine 0.88 Labs (7/16): ANA negative, BNP 58, K 4.3, creatinine 1.1, anti-RNP negative Labs (9/16): K 4.4, creatinine 1.26, BNP 91 Labs (10/16): K 4.3, creatinine 1.04 Labs (12/16): K 4.2, creatinine 1.07, BNP 130 Labs (1/17): K 4.3, creatinine 1.23, BNP 42 Labs (3/17): K 4.2, creatinine 1.19 Labs (5/17): K 4.3, creatinine 1.35, BNP 44 Labs (6/17): K 4.4, creatinine 1.18, BNP 44 Labs (10/17): K 4.2, creatinine 0.98, BNP 43 Labs (12/17): K 4.5, creatinine 1.23. BNP 64.5 Labs (2/18): K 4.5, creatinine 1.21, BNP 64 . PMH: 1. Pulmonary hypertension: RHC (5/16) with mean RA 18, PA 83/33 mean 53, mean PCWP 19, CI 2.61, PVR 5.8 WU.   Echo (5/16) with EF 65-70%, septal flattening, dilated RV with PA systolic pressure 99 mmHg, +bubble study.  PFTs (4/16) with FVC 76%, FEV1 75%, ratio 97%, TLC 66%, DLCO 26% => mild restriction, mild obstruction, severe diffuse abnormality (pulmonary vascular problem).  CTA chest (4/16) with chronic interstitial and obstructive lung disease, small mediastinal and hilar lymph noes, no PE.  Lymph node biopsy negative for sarcoidosis (reactive changes).  V/Q scan (6/16): No acute or chronic PE. TEE (6/16): Normal LV size and systolic function, EF 55-60%,RV was mildly dilated with mildly  decreased systolic function, D-shaped interventricular septum suggestive of RV pressure/volume overload, there appeared to be a small PFO present with some bubbles crossing, no ASD.  ANA negative, anti-RNP negative, HIV negative. Sleep study (8/16) without OSA.  Kelly Donaldson did not tolerate Adcirca or Revatio.  Kelly Donaldson cannot tolerate more than 200 mcg bid Selexipag.  - Echo (6/17): EF 60-65%, normal diastolic function, D-shaped interventricular septum, mildly dilated RV with normal RV  systolic function, PASP 39, IVC normal.  2. Chronic venous insufficiency. 3. Hyperlipidemia.  4. GERD 5. Chronic hypoxemic respiratory failure: Hypoxemia noted x 6 years at least.  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   SH: Married, quit smoking in 3/16.  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    FH: No pulmonary hypertension, no premature CAD, no sudden death.   ROS: All systems reviewed and negative except as per HPI.    Current Outpatient Prescriptions  Medication Sig Dispense Refill  . aspirin EC 81 MG tablet Take 1 tablet (81 mg total) by mouth daily. 90 tablet 3  . cetirizine (ZYRTEC) 10 MG tablet Take 1 tablet (10 mg total) by mouth daily. 90 tablet 3  . citalopram (CELEXA) 40 MG tablet TAKE ONE TABLET BY MOUTH ONCE DAILY FOR 30 DAYS 30 tablet 2  . HYDROcodone-acetaminophen (NORCO) 10-325 MG tablet Take 1-2 tablets by mouth every 6 (six) hours as needed. 240 tablet 0  . Ipratropium-Albuterol (COMBIVENT RESPIMAT) 20-100 MCG/ACT AERS respimat Inhale 1 puff into the lungs every 6 (six) hours as needed for wheezing. 3 Inhaler 3  . ipratropium-albuterol (DUONEB) 0.5-2.5 (3) MG/3ML SOLN Take 3 mLs by nebulization 4 (four) times daily as needed. 360 mL 11  . Macitentan (OPSUMIT) 10 MG TABS Take 1 tablet (10 mg total) by mouth daily. 30 tablet 11  . OXYGEN 2.5 lpm with sleep and 4 lpm with exertion  [facility]    . potassium chloride SA (K-DUR,KLOR-CON) 20 MEQ tablet Take 1 tablet (20 mEq total) by mouth 2 (two) times daily. 180 tablet 3  . pravastatin (PRAVACHOL) 20 MG tablet Take 1 tablet (20 mg total) by mouth daily. 90 tablet 3  . Riociguat (ADEMPAS) 2 MG TABS Take 2 mg by mouth 3 (three) times daily. 90 tablet 11  . Selexipag 200 MCG TABS Take 1 tablet (200 mcg total) by mouth 2 (two) times daily. 60 tablet 6  . torsemide (DEMADEX) 20 MG tablet Take 4 tablets (80 mg total) by mouth daily. 360 tablet 3  . Vitamin D, Ergocalciferol,  (DRISDOL) 50000 units CAPS capsule TAKE ONE CAPSULE BY MOUTH ONCE WEEKLY FOR 30 DAYS 12 capsule 3  . zolpidem (AMBIEN) 5 MG tablet TAKE ONE TABLET BY MOUTH AT BEDTIME 30 tablet 2   No current facility-administered medications for this encounter.    BP (!) 100/52   Pulse 77   Wt 254 lb 4 oz (115.3 kg)   SpO2 92% Comment: on 2L of O2  BMI 39.82 kg/m   Wt Readings from Last 3 Encounters:  03/21/17 254 lb 4 oz (115.3 kg)  03/03/17 254 lb (115.2 kg)  02/07/17 250 lb 9.6 oz (113.7 kg)     General: NAD. Wearing oxygen. Walked in the clinic. Husband present.  Neck: Thick, JVP flat.  No thyromegaly or thyroid nodule. No carotid bruit. Lungs: Clear, normal effort. Breathing swallow.  CV: Nondisplaced PMI.  Heart regular S1/S2, no S3, 2/6 HSM LLSB.      Abdomen: Soft, NT, ND, no HSM.  No bruits or masses. +BS  Skin: Intact without lesions or rashes.  Neurologic: Alert and oriented x 3.  Psych: Normal Affect.  Extremities: No clubbing or cyanosis.No edema.  HEENT: Normal.  Assessment/Plan:  1. Pulmonary arterial hypertension with RV failure: Severe on prior RHC. 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  There was concern from CTA chest for sarcoidosis, but lymph node biopsy was negative.  LFTs normal, ANA negative, anti-RNP negative, HIV negative.  V/Q scan negative for chronic PE.  Small PFO but no ASD on TEE.  No OSA on sleep study.  Kelly Donaldson did not tolerate Revatio due to joint pain (now resolved), and Kelly Donaldson did not tolerate Adcirca with worsening dyspnea.  Kelly Donaldson does not tolerate more than 200 mcg bid Selexipag due to pelvic pain.  Echo 6/17 showed mildly dilated RV with normal systolic function and PASP appeared lower at 39 mmHg.  - Open lung biopsy deemed too risky, probably not sarcoid.   - Volume status stable. Continue current dose of torsemide. - Continue opsumit, selexipag, and riociguat 2 mg twice a day  (unable to go higher due to dizziness) Intolerant up titration of selexipag due to joint pain.  - I will arrange for echo with followup in 6/18.  - Defer 6 minute walk today because will get one soon at pulmonary rehab.   2. Chronic hypoxemic respiratory failure: Chronic interstitial and obstructive lung disease, mediastinal and hilar lymphadenopathy.  Concern for sarcoidosis.  PFTs showed only mild restriction and obstruction.  Biopsy did not show sarcoid.  Deemed too high risk for open lung biopsy.  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 - O2 sats stable. Continue current regimen.   - Sees pulmonary, Dr [name].  Follow up in 6/18 with echo  Amy Clegg, NP-C  03/21/2017  Patient seen with NP, agree with the above note.  Patient is stable to improved, feels like Adempas helps.  Unable to increase beyond 2 mg tid. Continue current PH meds.  Kelly Donaldson will get a 6 minute walk when Kelly Donaldson start pulmonary rehab soon.  Volume status looks ok, continue current torsemide. I will arrange for followup and echo in 6/18.   [name] 03/23/2017

## 2017-03-24 ENCOUNTER — Other Ambulatory Visit (HOSPITAL_COMMUNITY): Payer: Self-pay | Admitting: Cardiology

## 2017-03-24 DIAGNOSIS — I10 Essential (primary) hypertension: Secondary | ICD-10-CM | POA: Diagnosis not present

## 2017-03-24 DIAGNOSIS — R0902 Hypoxemia: Secondary | ICD-10-CM | POA: Diagnosis not present

## 2017-03-24 DIAGNOSIS — I5021 Acute systolic (congestive) heart failure: Secondary | ICD-10-CM | POA: Diagnosis not present

## 2017-03-24 MED ORDER — RIOCIGUAT 2 MG PO TABS: 2.0000 mg | ORAL_TABLET | Freq: Three times a day (TID) | ORAL | 11 refills | Status: DC

## 2017-03-25 DIAGNOSIS — R531 Weakness: Secondary | ICD-10-CM | POA: Diagnosis not present

## 2017-03-30 DIAGNOSIS — I270 Primary pulmonary hypertension: Secondary | ICD-10-CM | POA: Diagnosis not present

## 2017-04-02 ENCOUNTER — Telehealth (HOSPITAL_COMMUNITY): Payer: Self-pay

## 2017-04-02 NOTE — Telephone Encounter (Signed)
Hold torsemide for a day, check BP tomorrow and call in.  Is she symptomatic?

## 2017-04-02 NOTE — Telephone Encounter (Signed)
Patient called CHF clinic to report BP 80s/50s, asymptomatic, wanting to know if medicatiosn need to be adjusted. Will forward to [name] to review.  Ave Filter, RN

## 2017-04-03 NOTE — Telephone Encounter (Signed)
Spoke w/pt, she states she was pretty dizzy earlier today, she states she ate then felt better.  She will hold Torsemide tomorrow and continue to monitor BP, she will [place] back on Monday with update.  If feels bad tomorrow she will [place] back then.

## 2017-04-08 ENCOUNTER — Ambulatory Visit (INDEPENDENT_AMBULATORY_CARE_PROVIDER_SITE_OTHER): Payer: Medicare PPO | Admitting: Physician Assistant

## 2017-04-08 ENCOUNTER — Encounter: Payer: Self-pay | Admitting: Physician Assistant

## 2017-04-08 VITALS — BP 97/60 | HR 69 | Temp 97.6°F | Ht 67.0 in | Wt 255.2 lb

## 2017-04-08 DIAGNOSIS — D869 Sarcoidosis, unspecified: Secondary | ICD-10-CM | POA: Diagnosis not present

## 2017-04-08 DIAGNOSIS — I5032 Chronic diastolic (congestive) heart failure: Secondary | ICD-10-CM

## 2017-04-08 DIAGNOSIS — J849 Interstitial pulmonary disease, unspecified: Secondary | ICD-10-CM

## 2017-04-08 DIAGNOSIS — I2729 Other secondary pulmonary hypertension: Secondary | ICD-10-CM

## 2017-04-08 MED ORDER — TORSEMIDE 20 MG PO TABS: 20.0000 mg | ORAL_TABLET | Freq: Every day | ORAL | 3 refills | Status: DC

## 2017-04-08 NOTE — Patient Instructions (Signed)
Edema Edema is when you have too much fluid in your body or under your skin. Edema may make your legs, feet, and ankles swell up. Swelling is also common in looser tissues, like around your eyes. This is a common condition. It gets more common as you get older. There are many possible causes of edema. Eating too much salt (sodium) and being on your feet or sitting for a long time can cause edema in your legs, feet, and ankles. Hot weather may make edema worse. Edema is usually painless. Your skin may look swollen or shiny. Follow these instructions at home:  Keep the swollen body part raised (elevated) above the level of your heart when you are sitting or lying down.  Do not sit still or stand for a long time.  Do not wear tight clothes. Do not wear garters on your upper legs.  Exercise your legs. This can help the swelling go down.  Wear elastic bandages or support stockings as told by your doctor.  Eat a low-salt (low-sodium) diet to reduce fluid as told by your doctor.  Depending on the cause of your swelling, you may need to limit how much fluid you drink (fluid restriction).  Take over-the-counter and prescription medicines only as told by your doctor. Contact a doctor if:  Treatment is not working.  You have heart, liver, or kidney disease and have symptoms of edema.  You have sudden and unexplained weight gain. Get help right away if:  You have shortness of breath or chest pain.  You cannot breathe when you lie down.  You have pain, redness, or warmth in the swollen areas.  You have heart, liver, or kidney disease and get edema all of a sudden.  You have a fever and your symptoms get worse all of a sudden. Summary  Edema is when you have too much fluid in your body or under your skin.  Edema may make your legs, feet, and ankles swell up. Swelling is also common in looser tissues, like around your eyes.  Raise (elevate) the swollen body part above the level of your  heart when you are sitting or lying down.  Follow your doctor's instructions about diet and how much fluid you can drink (fluid restriction). This information is not intended to replace advice given to you by your health care provider. Make sure you discuss any questions you have with your health care provider. Document Released: 05/13/2008 Document Revised: 12/13/2016 Document Reviewed: 12/13/2016 Elsevier Interactive Patient Education  2017 [employer] Inc.  

## 2017-04-10 NOTE — Progress Notes (Signed)
BP 97/60   Pulse 69   Temp 97.6 F (36.4 C) (Oral)   Ht 5\' 7"  (1.702 m)   Wt 255 lb 3.2 oz (115.8 kg)   BMI 39.97 kg/m    Subjective:    Patient ID: , female    DOB: 01/16/1956, 60 y.o.   MRN: [id number]  HPI: Kelly Donaldson is a 61 y.o. female presenting on 04/08/2017 for Hospitalization Follow-up [facility] Hypotension)  This patient comes in for periodic recheck on medications and conditions including hypotension admission at [facility]. She is feeling much better and has a follow up with cardiology and pulmonology. Reports still slight dizziness when standing up. She does take her time.  We will make some adjustments on medications  All medications are reviewed today. There are no reports of any problems with the medications. All of the medical conditions are reviewed and updated.  Lab work is reviewed and will be ordered as medically necessary. There are no new problems reported with today's visit.   Relevant past medical, surgical, family and social history reviewed and updated as indicated. Allergies and medications reviewed and updated.  Past Medical History:  Diagnosis Date  . Allergy   . Depression   . GERD (gastroesophageal reflux disease)   . Hyperlipidemia   . Oxygen dependent   . Shortness of breath dyspnea   . Varicose veins     Past Surgical History:  Procedure Laterality Date  . CARDIAC CATHETERIZATION N/A 04/20/2015   Procedure: Right Heart Cath;  Surgeon: 06/20/2015, MD;  Location: [facility] INVASIVE CV LAB;  Service: Cardiovascular;  Laterality: N/A;  . TEE WITHOUT CARDIOVERSION N/A 05/11/2015   Procedure: TRANSESOPHAGEAL ECHOCARDIOGRAM (TEE);  Surgeon: 07/11/2015, MD;  Location: [facility] ENDOSCOPY;  Service: Cardiovascular;  Laterality: N/A;  . TUBAL LIGATION     11/84    Review of Systems  Constitutional: Negative.  Negative for activity change, fatigue and fever.  HENT: Negative.   Eyes: Negative.   Respiratory: Positive for  shortness of breath. Negative for cough and wheezing.   Cardiovascular: Positive for leg swelling. Negative for chest pain and palpitations.  Gastrointestinal: Negative.  Negative for abdominal pain.  Endocrine: Negative.   Genitourinary: Negative.  Negative for dysuria.  Musculoskeletal: Negative.   Skin: Negative.   Neurological: Negative.     Allergies as of 04/08/2017      Reactions   Gabapentin    Pt states that she can not take with antidepressants   Latex Rash      Medication List       Accurate as of 04/08/17 11:59 PM. Always use your most recent med list.          aspirin EC 81 MG tablet Take 1 tablet (81 mg total) by mouth daily.   cetirizine 10 MG tablet Commonly known as:  ZYRTEC Take 1 tablet (10 mg total) by mouth daily.   citalopram 40 MG tablet Commonly known as:  CELEXA TAKE ONE TABLET BY MOUTH ONCE DAILY FOR 30 DAYS   HYDROcodone-acetaminophen 10-325 MG tablet Commonly known as:  NORCO Take 1-2 tablets by mouth every 6 (six) hours as needed.   ipratropium-albuterol 0.5-2.5 (3) MG/3ML Soln Commonly known as:  DUONEB Take 3 mLs by nebulization 4 (four) times daily as needed.   Ipratropium-Albuterol 20-100 MCG/ACT Aers respimat Commonly known as:  COMBIVENT RESPIMAT Inhale 1 puff into the lungs every 6 (six) hours as needed for wheezing.   Macitentan 10 MG  Tabs Commonly known as:  OPSUMIT Take 1 tablet (10 mg total) by mouth daily.   OXYGEN 2.5 lpm with sleep and 4 lpm with exertion  [facility]   potassium chloride SA 20 MEQ tablet Commonly known as:  K-DUR,KLOR-CON Take 1 tablet (20 mEq total) by mouth 2 (two) times daily.   pravastatin 20 MG tablet Commonly known as:  PRAVACHOL Take 1 tablet (20 mg total) by mouth daily.   Riociguat 2 MG Tabs Commonly known as:  ADEMPAS Take 2 mg by mouth 3 (three) times daily.   Selexipag 200 MCG Tabs Take 1 tablet (200 mcg total) by mouth 2 (two) times daily.   torsemide 20 MG tablet Commonly  known as:  DEMADEX Take 1-4 tablets (20-80 mg total) by mouth daily.   Vitamin D (Ergocalciferol) 50000 units Caps capsule Commonly known as:  DRISDOL TAKE ONE CAPSULE BY MOUTH ONCE WEEKLY FOR 30 DAYS   zolpidem 5 MG tablet Commonly known as:  AMBIEN TAKE ONE TABLET BY MOUTH AT BEDTIME          Objective:    BP 97/60   Pulse 69   Temp 97.6 F (36.4 C) (Oral)   Ht 5\' 7"  (1.702 m)   Wt 255 lb 3.2 oz (115.8 kg)   BMI 39.97 kg/m   Allergies  Allergen Reactions  . Gabapentin     Pt states that she can not take with antidepressants  . Latex Rash    Physical Exam  Constitutional: She is oriented to person, place, and time. She appears well-developed and well-nourished.  HENT:  Head: Normocephalic and atraumatic.  Right Ear: Tympanic membrane, external ear and ear canal normal.  Left Ear: Tympanic membrane, external ear and ear canal normal.  Nose: Nose normal. No rhinorrhea.  Mouth/Throat: Oropharynx is clear and moist and mucous membranes are normal. No oropharyngeal exudate or posterior oropharyngeal erythema.  Eyes: Conjunctivae and EOM are normal. Pupils are equal, round, and reactive to light.  Neck: Normal range of motion. Neck supple.  Cardiovascular: Normal rate, regular rhythm, normal heart sounds and intact distal pulses.   Pulmonary/Chest: Effort normal and breath sounds normal.  Abdominal: Soft. Bowel sounds are normal.  Neurological: She is alert and oriented to person, place, and time. She has normal reflexes.  Skin: Skin is warm and dry. No rash noted.  Psychiatric: She has a normal mood and affect. Her behavior is normal. Judgment and thought content normal.  Nursing note and vitals reviewed.   Results for orders placed or performed during the hospital encounter of 03/21/17  Basic metabolic panel  Result Value Ref Range   Sodium 139 135 - 145 mmol/L   Potassium 4.1 3.5 - 5.1 mmol/L   Chloride 101 101 - 111 mmol/L   CO2 26 22 - 32 mmol/L   Glucose,  Bld 148 (H) 65 - 99 mg/dL   BUN 22 (H) 6 - 20 mg/dL   Creatinine, Ser 03/23/17 (H) 0.44 - 1.00 mg/dL   Calcium 9.0 8.9 - 7.40 mg/dL   GFR calc non Af Amer 46 (L) >60 mL/min   GFR calc Af Amer 53 (L) >60 mL/min   Anion gap 12 5 - 15  B Nat Peptide  Result Value Ref Range   B Natriuretic Peptide 25.9 0.0 - 100.0 pg/mL      Assessment & Plan:   1. Chronic diastolic CHF (congestive heart failure) (HCC) - torsemide (DEMADEX) 20 MG tablet; Take 1-4 tablets (20-80 mg total) by mouth daily.  Dispense: 360 tablet; Refill: 3  2. Pulmonary hypertension associated with sarcoidosis (HCC)  3. ILD (interstitial lung disease) (HCC)   Current Outpatient Prescriptions:  .  aspirin EC 81 MG tablet, Take 1 tablet (81 mg total) by mouth daily., Disp: 90 tablet, Rfl: 3 .  cetirizine (ZYRTEC) 10 MG tablet, Take 1 tablet (10 mg total) by mouth daily., Disp: 90 tablet, Rfl: 3 .  citalopram (CELEXA) 40 MG tablet, TAKE ONE TABLET BY MOUTH ONCE DAILY FOR 30 DAYS, Disp: 30 tablet, Rfl: 2 .  HYDROcodone-acetaminophen (NORCO) 10-325 MG tablet, Take 1-2 tablets by mouth every 6 (six) hours as needed., Disp: 240 tablet, Rfl: 0 .  Ipratropium-Albuterol (COMBIVENT RESPIMAT) 20-100 MCG/ACT AERS respimat, Inhale 1 puff into the lungs every 6 (six) hours as needed for wheezing., Disp: 3 Inhaler, Rfl: 3 .  ipratropium-albuterol (DUONEB) 0.5-2.5 (3) MG/3ML SOLN, Take 3 mLs by nebulization 4 (four) times daily as needed., Disp: 360 mL, Rfl: 11 .  Macitentan (OPSUMIT) 10 MG TABS, Take 1 tablet (10 mg total) by mouth daily., Disp: 30 tablet, Rfl: 11 .  OXYGEN, 2.5 lpm with sleep and 4 lpm with exertion  [facility], Disp: , Rfl:  .  potassium chloride SA (K-DUR,KLOR-CON) 20 MEQ tablet, Take 1 tablet (20 mEq total) by mouth 2 (two) times daily., Disp: 180 tablet, Rfl: 3 .  pravastatin (PRAVACHOL) 20 MG tablet, Take 1 tablet (20 mg total) by mouth daily., Disp: 90 tablet, Rfl: 3 .  Riociguat (ADEMPAS) 2 MG TABS, Take 2 mg by mouth  3 (three) times daily., Disp: 90 tablet, Rfl: 11 .  Selexipag 200 MCG TABS, Take 1 tablet (200 mcg total) by mouth 2 (two) times daily., Disp: 60 tablet, Rfl: 6 .  torsemide (DEMADEX) 20 MG tablet, Take 1-4 tablets (20-80 mg total) by mouth daily., Disp: 360 tablet, Rfl: 3 .  Vitamin D, Ergocalciferol, (DRISDOL) 50000 units CAPS capsule, TAKE ONE CAPSULE BY MOUTH ONCE WEEKLY FOR 30 DAYS, Disp: 12 capsule, Rfl: 3 .  zolpidem (AMBIEN) 5 MG tablet, TAKE ONE TABLET BY MOUTH AT BEDTIME, Disp: 30 tablet, Rfl: 2  Continue all other maintenance medications as listed above.  Follow up plan: No Follow-up on file.  Educational handout given for edema  Remus Loffler PA-C [facility] [address]  [place], [place] [address] [phone]   04/10/2017, 5:44 PM

## 2017-04-11 ENCOUNTER — Ambulatory Visit: Payer: Self-pay | Admitting: Physician Assistant

## 2017-04-17 ENCOUNTER — Encounter (HOSPITAL_COMMUNITY)
Admission: RE | Admit: 2017-04-17 | Discharge: 2017-04-17 | Disposition: A | Discharge status: Home / Self Care | Payer: Medicare PPO | Source: Ambulatory Visit | Attending: Cardiology | Admitting: Cardiology

## 2017-04-17 VITALS — BP 94/50 | HR 74 | Ht 67.0 in | Wt 259.0 lb

## 2017-04-17 DIAGNOSIS — I2721 Secondary pulmonary arterial hypertension: Secondary | ICD-10-CM | POA: Diagnosis not present

## 2017-04-17 NOTE — Progress Notes (Signed)
2Pulmonary Individual Treatment Plan  Patient Details  Name: Kelly Donaldson MRN: [id number] Date of Birth: Dec 25, 1955 Referring Provider:     PULMONARY REHAB OTHER RESP ORIENTATION from 04/17/2017 in [facility]  Referring Provider  [name]      Initial Encounter Date:    PULMONARY REHAB OTHER RESP ORIENTATION from 04/17/2017 in [facility]  Date  04/17/17  Referring Provider  [name]      Visit Diagnosis: PAH (pulmonary artery hypertension) (HCC)  Patient's Home Medications on Admission:   Current Outpatient Prescriptions:  .  aspirin EC 81 MG tablet, Take 1 tablet (81 mg total) by mouth daily., Disp: 90 tablet, Rfl: 3 .  cetirizine (ZYRTEC) 10 MG tablet, Take 1 tablet (10 mg total) by mouth daily., Disp: 90 tablet, Rfl: 3 .  citalopram (CELEXA) 40 MG tablet, TAKE ONE TABLET BY MOUTH ONCE DAILY FOR 30 DAYS, Disp: 30 tablet, Rfl: 2 .  HYDROcodone-acetaminophen (NORCO) 10-325 MG tablet, Take 1-2 tablets by mouth every 6 (six) hours as needed., Disp: 240 tablet, Rfl: 0 .  Ipratropium-Albuterol (COMBIVENT RESPIMAT) 20-100 MCG/ACT AERS respimat, Inhale 1 puff into the lungs every 6 (six) hours as needed for wheezing., Disp: 3 Inhaler, Rfl: 3 .  ipratropium-albuterol (DUONEB) 0.5-2.5 (3) MG/3ML SOLN, Take 3 mLs by nebulization 4 (four) times daily as needed., Disp: 360 mL, Rfl: 11 .  Macitentan (OPSUMIT) 10 MG TABS, Take 1 tablet (10 mg total) by mouth daily., Disp: 30 tablet, Rfl: 11 .  OXYGEN, 2.5 lpm with sleep and 4 lpm with exertion  [employer], Disp: , Rfl:  .  potassium chloride SA (K-DUR,KLOR-CON) 20 MEQ tablet, Take 1 tablet (20 mEq total) by mouth 2 (two) times daily., Disp: 180 tablet, Rfl: 3 .  pravastatin (PRAVACHOL) 20 MG tablet, Take 1 tablet (20 mg total) by mouth daily., Disp: 90 tablet, Rfl: 3 .  Riociguat (ADEMPAS) 2 MG TABS, Take 2 mg by mouth 3 (three) times daily., Disp: 90 tablet, Rfl: 11 .  Selexipag 200 MCG TABS,  Take 1 tablet (200 mcg total) by mouth 2 (two) times daily., Disp: 60 tablet, Rfl: 6 .  torsemide (DEMADEX) 20 MG tablet, Take 1-4 tablets (20-80 mg total) by mouth daily., Disp: 360 tablet, Rfl: 3 .  Vitamin D, Ergocalciferol, (DRISDOL) 50000 units CAPS capsule, TAKE ONE CAPSULE BY MOUTH ONCE WEEKLY FOR 30 DAYS, Disp: 12 capsule, Rfl: 3 .  zolpidem (AMBIEN) 5 MG tablet, TAKE ONE TABLET BY MOUTH AT BEDTIME, Disp: 30 tablet, Rfl: 2  Past Medical History: Past Medical History:  Diagnosis Date  . Allergy   . Depression   . GERD (gastroesophageal reflux disease)   . Hyperlipidemia   . Oxygen dependent   . Shortness of breath dyspnea   . Varicose veins     Tobacco Use: History  Smoking Status  . Former Smoker  . Packs/day: 0.25  . Years: 17.00  . Types: Cigarettes  . Quit date: 03/02/2015  Smokeless Tobacco  . Never Used    Labs: Recent Review Flowsheet Data    There is no flowsheet data to display.      Capillary Blood Glucose: No results found for: GLUCAP   ADL UCSD:     Pulmonary Assessment Scores    Row Name 04/17/17 1229         ADL UCSD   ADL Phase Entry     SOB Score total 67     Rest 0     Walk  8     Stairs 5     Bath 0     Dress 2     Shop 3       CAT Score   CAT Score 16       mMRC Score   mMRC Score 4        Pulmonary Function Assessment:     Pulmonary Function Assessment - 04/17/17 1226      Pulmonary Function Tests   FVC% 76 %   FEV1% 75 %   FEV1/FVC Ratio 97   DLCO% 26 %     Initial Spirometry Results   FVC% 76 %   FEV1% 74 %   FEV1/FVC Ratio 96     Post Bronchodilator Spirometry Results   FVC% 76 %   FEV1% 74 %   FEV1/FVC Ratio 96     Breath   Bilateral Breath Sounds Clear   Shortness of Breath Yes      Exercise Target Goals: Date: 04/17/17  Exercise Program Goal: Individual exercise prescription set with THRR, safety & activity barriers. Participant demonstrates ability to understand and report RPE using BORG  scale, to self-measure pulse accurately, and to acknowledge the importance of the exercise prescription.  Exercise Prescription Goal: Starting with aerobic activity 30 plus minutes a day, 3 days per week for initial exercise prescription. Provide home exercise prescription and guidelines that participant acknowledges understanding prior to discharge.  Activity Barriers & Risk Stratification:   6 Minute Walk:     6 Minute Walk    Row Name 04/17/17 1152         6 Minute Walk   Phase Initial     Distance 750 feet     Distance % Change 0 %     Walk Time 6 minutes     # of Rest Breaks 1     MPH 1.42     METS 2.08     RPE 15     Perceived Dyspnea  16     VO2 Peak 7.44     Symptoms No     Resting HR 74 bpm     Resting BP 94/50     Max Ex. HR 143 bpm     Max Ex. BP 134/62     2 Minute Post BP 106/56        Oxygen Initial Assessment:     Oxygen Initial Assessment - 04/17/17 1235      Home Oxygen   Home Oxygen Device Home Concentrator;Portable Concentrator   Sleep Oxygen Prescription None   Home Exercise Oxygen Prescription Continuous   Liters per minute 4   Home at Rest Exercise Oxygen Prescription Continuous   Liters per minute 2   Compliance with Home Oxygen Use Yes     Initial 6 min Walk   Oxygen Used Continuous   Liters per minute 4   Resting Oxygen Saturation  during 6 min walk 94 %   Exercise Oxygen Saturation  during 6 min walk 72 %  Had patient stop to rest, increase oxygen to 6 liters, SaO2 increased to 90 mg/dl. then test was resumed and patient finished the walk test.      Program Oxygen Prescription   Program Oxygen Prescription Continuous     Intervention   Short Term Goals To learn and demonstrate proper purse lipped breathing techniques or other breathing techniques.;To learn and exhibit compliance with exercise, home and travel O2 prescription;To Learn and understand importance of maintaining oxygen saturations>88%  Long  Term Goals Maintenance  of O2 saturations>88%      Oxygen Re-Evaluation:   Oxygen Discharge (Final Oxygen Re-Evaluation):   Initial Exercise Prescription:     Initial Exercise Prescription - 04/17/17 1100      Date of Initial Exercise RX and Referring Provider   Date 04/17/17   Referring Provider [name]     Oxygen   Oxygen Continuous   Liters 6     Treadmill   MPH 1.4   Grade 0   Minutes 15   METs 2     NuStep   Level 2   SPM 15   Minutes 20   METs 1.8     Prescription Details   Frequency (times per week) 2   Duration Progress to 30 minutes of continuous aerobic without signs/symptoms of physical distress     Intensity   THRR 40-80% of Max Heartrate [phone]   Ratings of Perceived Exertion 11-13   Perceived Dyspnea 0-4     Progression   Progression Continue progressive overload as per policy without signs/symptoms or physical distress.     Resistance Training   Training Prescription Yes   Weight 1   Reps 10-15      Perform Capillary Blood Glucose checks as needed.  Exercise Prescription Changes:   Exercise Comments:   Exercise Goals and Review:      Exercise Goals    Row Name 04/17/17 1256             Exercise Goals   Increase Physical Activity Yes       Intervention Provide advice, education, support and counseling about physical activity/exercise needs.;Develop an individualized exercise prescription for aerobic and resistive training based on initial evaluation findings, risk stratification, comorbidities and participant's personal goals.       Expected Outcomes Achievement of increased cardiorespiratory fitness and enhanced flexibility, muscular endurance and strength shown through measurements of functional capacity and personal statement of participant.       Increase Strength and Stamina Yes       Intervention Provide advice, education, support and counseling about physical activity/exercise needs.;Develop an individualized exercise prescription for  aerobic and resistive training based on initial evaluation findings, risk stratification, comorbidities and participant's personal goals.       Expected Outcomes Achievement of increased cardiorespiratory fitness and enhanced flexibility, muscular endurance and strength shown through measurements of functional capacity and personal statement of participant.          Exercise Goals Re-Evaluation :   Discharge Exercise Prescription (Final Exercise Prescription Changes):   Nutrition:  Target Goals: Understanding of nutrition guidelines, daily intake of sodium 1500mg , cholesterol 200mg , calories 30% from fat and 7% or less from saturated fats, daily to have 5 or more servings of fruits and vegetables.  Biometrics:     Pre Biometrics - 04/17/17 1154      Pre Biometrics   Height 5\' 7"  (1.702 m)   Weight 259 lb 0.7 oz (117.5 kg)   Waist Circumference 43 inches   Hip Circumference 52 inches   Waist to Hip Ratio 0.83 %   BMI (Calculated) 40.7   Triceps Skinfold 31 mm   % Body Fat 48 %   Grip Strength 47.86 kg   Flexibility 0 in   Single Leg Stand 2 seconds       Nutrition Therapy Plan and Nutrition Goals:   Nutrition Discharge: Rate Your Plate Scores:   Nutrition Goals Re-Evaluation:   Nutrition Goals Discharge (Final Nutrition  Goals Re-Evaluation):   Psychosocial: Target Goals: Acknowledge presence or absence of significant depression and/or stress, maximize coping skills, provide positive support system. Participant is able to verbalize types and ability to use techniques and skills needed for reducing stress and depression.  Initial Review & Psychosocial Screening:     Initial Psych Review & Screening - 04/17/17 1504      Initial Review   Current issues with Current Depression;History of Depression     Family Dynamics   Good Support System? Yes     Barriers   Psychosocial barriers to participate in program Psychosocial barriers identified (see note)  QOL  overal score 16.69. Discussed counseling. Patient stated she did not feel she needed it at this time.      Screening Interventions   Interventions Encouraged to exercise      Quality of Life Scores:     Quality of Life - 04/17/17 1155      Quality of Life Scores   Health/Function Pre 14.91 %   Socioeconomic Pre 17.29 %   Psych/Spiritual Pre 19.86 %   Family Pre 17.1 %   GLOBAL Pre 16.69 %      PHQ-9: Recent Review Flowsheet Data    Depression screen [facility] 2/9 04/17/2017 04/08/2017 03/03/2017 12/13/2016 08/30/2016   Decreased Interest 0 2 1 0 1   Down, Depressed, Hopeless - 2 1 0 1   PHQ - 2 Score 0 4 2 0 2   Altered sleeping 2 1 2  - 2   Tired, decreased energy 2 1 2  - 2   Change in appetite 1 1 2  - 2   Feeling bad or failure about yourself  1 1 2  - 3   Trouble concentrating 0 1 3 - 2   Moving slowly or fidgety/restless 0 1 1 - 1   Suicidal thoughts 0 1 1 - 1   PHQ-9 Score 6 11 15  - 15     Interpretation of Total Score  Total Score Depression Severity:  1-4 = Minimal depression, 5-9 = Mild depression, 10-14 = Moderate depression, 15-19 = Moderately severe depression, 20-27 = Severe depression   Psychosocial Evaluation and Intervention:     Psychosocial Evaluation - 04/17/17 1506      Psychosocial Evaluation & Interventions   Interventions Stress management education;Relaxation education;Encouraged to exercise with the program and follow exercise prescription   Comments Patient QOL overall scores where low 16.69. Discussed patient seeking counseling. Patient stated she did not feel she needed it at this time.    Continue Psychosocial Services  Follow up required by staff      Psychosocial Re-Evaluation:   Psychosocial Discharge (Final Psychosocial Re-Evaluation):    Education: Education Goals: Education classes will be provided on a weekly basis, covering required topics. Participant will state understanding/return demonstration of topics presented.  Learning  Barriers/Preferences:     Learning Barriers/Preferences - 04/17/17 1159      Learning Barriers/Preferences   Learning Barriers None   Learning Preferences Verbal Instruction;Individual Instruction      Education Topics: How Lungs Work and Diseases: - Discuss the anatomy of the lungs and diseases that can affect the lungs, such as COPD.   Exercise: -Discuss the importance of exercise, FITT principles of exercise, normal and abnormal responses to exercise, and how to exercise safely.   Environmental Irritants: -Discuss types of environmental irritants and how to limit exposure to environmental irritants.   Meds/Inhalers and oxygen: - Discuss respiratory medications, definition of an inhaler and oxygen, and  the proper way to use an inhaler and oxygen.   Energy Saving Techniques: - Discuss methods to conserve energy and decrease shortness of breath when performing activities of daily living.    Bronchial Hygiene / Breathing Techniques: - Discuss breathing mechanics, pursed-lip breathing technique,  proper posture, effective ways to clear airways, and other functional breathing techniques   Cleaning Equipment: - Provides group verbal and written instruction about the health risks of elevated stress, cause of high stress, and healthy ways to reduce stress.   Nutrition I: Fats: - Discuss the types of cholesterol, what cholesterol does to the body, and how cholesterol levels can be controlled.   Nutrition II: Labels: -Discuss the different components of food labels and how to read food labels.   Respiratory Infections: - Discuss the signs and symptoms of respiratory infections, ways to prevent respiratory infections, and the importance of seeking medical treatment when having a respiratory infection.   Stress I: Signs and Symptoms: - Discuss the causes of stress, how stress may lead to anxiety and depression, and ways to limit stress.   Stress II: Relaxation: -Discuss  relaxation techniques to limit stress.   Oxygen for Home/Travel: - Discuss how to prepare for travel when on oxygen and proper ways to transport and store oxygen to ensure safety.   Knowledge Questionnaire Score:     Knowledge Questionnaire Score - 04/17/17 1226      Knowledge Questionnaire Score   Pre Score 11/14      Core Components/Risk Factors/Patient Goals at Admission:     Personal Goals and Risk Factors at Admission - 04/17/17 1257      Core Components/Risk Factors/Patient Goals on Admission    Weight Management Weight Maintenance   Improve shortness of breath with ADL's Yes   Intervention Provide education, individualized exercise plan and daily activity instruction to help decrease symptoms of SOB with activities of daily living.   Expected Outcomes Short Term: Achieves a reduction of symptoms when performing activities of daily living.   Personal Goal Other Yes   Personal Goal Breathe better, Walk longer without SOB   Intervention Attend program 2 x week and supplement at home 3 x week.    Expected Outcomes Reach personal goals      Core Components/Risk Factors/Patient Goals Review:    Core Components/Risk Factors/Patient Goals at Discharge (Final Review):    ITP Comments:   Comments: Patient arrived for 1st visit/orientation/education at 10:00. Patient was referred to PR by [name] due to [facility] (I27.21).  During orientation advised patient on arrival and appointment times what to wear, what to do before, during and after exercise. Reviewed attendance and class policy. Talked about inclement weather and class consultation policy. Pt is scheduled to return Pulmonary Rehab on 01/23/17 at 13:30. Pt was advised to come to class 15 minutes before class starts. Patient was also given instructions on meeting with the dietician and attending the Family Structure classes. Pt is eager to get started. Patient participated in warm up stretches including weights ad resistance  training. She had to stop at 2 minutes 15 seconds due to drop in Sa02 of 72mg /dl. Increased his oxygen to 6 liters, Sa02 came up to 90mg /dl. Patient was then able to complete 6 minute walk test. Patient c/o light-headedness and dizziness during walk test. This subsided after 2 minute rest. She had (R) hip pain 6/10 during test. That subsided after 2 minute rest. Patient was measured for the equipment. Discussed equipment safety with patient. Took patient pre-anthropometric  measurements. Patient finished visit at 12:00.

## 2017-04-17 NOTE — Progress Notes (Signed)
Cardiac/Pulmonary Rehab Medication Review by a Pharmacist  Does the patient  feel that his/her medications are working for him/her?  yes  Has the patient been experiencing any side effects to the medications prescribed?  Yes.  Her BP has been low.  Does the patient measure his/her own blood pressure or blood glucose at home?  yes   Does the patient have any problems obtaining medications due to transportation or finances?   no  Understanding of regimen: excellent Understanding of indications: excellent Potential of compliance: excellent   Kelly Donaldson has no difficulties obtaining medications.  She has a good understanding of her meds and takes them as prescribed.    Kelly Donaldson 04/17/2017 11:19 AM

## 2017-04-22 ENCOUNTER — Encounter (HOSPITAL_COMMUNITY)
Admission: RE | Admit: 2017-04-22 | Discharge: 2017-04-22 | Disposition: A | Discharge status: Home / Self Care | Payer: Medicare PPO | Source: Ambulatory Visit | Attending: Cardiology | Admitting: Cardiology

## 2017-04-22 DIAGNOSIS — I2721 Secondary pulmonary arterial hypertension: Secondary | ICD-10-CM | POA: Diagnosis not present

## 2017-04-22 NOTE — Progress Notes (Signed)
Daily Session Note  Patient Details  Name: Kelly Donaldson MRN: [id number] Date of Birth: 06-Jun-1956 Referring Provider:     PULMONARY REHAB OTHER RESP ORIENTATION from 04/17/2017 in [place]  Referring Provider  Dr. [name]      Encounter Date: 04/22/2017  Check In:     Session Check In - 04/22/17 1351      Check-In   Location [facility]   Staff Present [name], MS, EP, [facility], Exercise Physiologist;Jhair Witherington Luther Parody, BS, EP, Exercise Physiologist   Supervising physician immediately available to respond to emergencies See telemetry face sheet for immediately available MD   Medication changes reported     No   Fall or balance concerns reported    No   Warm-up and Cool-down Performed as group-led instruction   Resistance Training Performed Yes   VAD Patient? No     Pain Assessment   Currently in Pain? No/denies   Pain Score 0-No pain      Capillary Blood Glucose: No results found for this or any previous visit (from the past 24 hour(s)).    History  Smoking Status  . Former Smoker  . Packs/day: 0.25  . Years: 17.00  . Types: Cigarettes  . Quit date: 03/02/2015  Smokeless Tobacco  . Never Used    Goals Met:  Independence with exercise equipment Improved SOB with ADL's Using PLB without cueing & demonstrates good technique Exercise tolerated well No report of cardiac concerns or symptoms Strength training completed today  Goals Unmet:  Not Applicable  Comments: Check out 230   Dr. Sinda Du is Medical Director for [facility] Pulmonary Rehab.

## 2017-04-24 ENCOUNTER — Encounter (HOSPITAL_COMMUNITY)
Admission: RE | Admit: 2017-04-24 | Discharge: 2017-04-24 | Disposition: A | Discharge status: Home / Self Care | Payer: Medicare PPO | Source: Ambulatory Visit | Attending: Cardiology | Admitting: Cardiology

## 2017-04-24 DIAGNOSIS — I2721 Secondary pulmonary arterial hypertension: Secondary | ICD-10-CM

## 2017-04-24 NOTE — Progress Notes (Signed)
Daily Session Note  Patient Details  Name: Kelly Donaldson MRN: [id number] Date of Birth: 09-16-1956 Referring Provider:     PULMONARY REHAB OTHER RESP ORIENTATION from 04/17/2017 in [place]  Referring Provider  Dr. [name]      Encounter Date: 04/24/2017  Check In:     Session Check In - 04/24/17 1339      Check-In   Location [facility]   Staff Present [name], MS, EP, [facility], Exercise Physiologist;Virlan Kempker Luther Parody, BS, EP, Exercise Physiologist   Supervising physician immediately available to respond to emergencies See telemetry face sheet for immediately available MD   Medication changes reported     No   Fall or balance concerns reported    No   Warm-up and Cool-down Performed as group-led instruction   Resistance Training Performed Yes   VAD Patient? No     Pain Assessment   Currently in Pain? No/denies   Pain Score 0-No pain   Multiple Pain Sites No      Capillary Blood Glucose: No results found for this or any previous visit (from the past 24 hour(s)).    History  Smoking Status  . Former Smoker  . Packs/day: 0.25  . Years: 17.00  . Types: Cigarettes  . Quit date: 03/02/2015  Smokeless Tobacco  . Never Used    Goals Met:  Independence with exercise equipment Improved SOB with ADL's Using PLB without cueing & demonstrates good technique Exercise tolerated well No report of cardiac concerns or symptoms Strength training completed today  Goals Unmet:  Not Applicable  Comments: Check out 230   Dr. Sinda Du is Medical Director for [facility] Pulmonary Rehab.

## 2017-04-29 ENCOUNTER — Encounter (HOSPITAL_COMMUNITY)
Admission: RE | Admit: 2017-04-29 | Discharge: 2017-04-29 | Disposition: A | Discharge status: Home / Self Care | Payer: Medicare PPO | Source: Ambulatory Visit | Attending: Cardiology | Admitting: Cardiology

## 2017-04-29 DIAGNOSIS — I2721 Secondary pulmonary arterial hypertension: Secondary | ICD-10-CM | POA: Diagnosis not present

## 2017-04-29 NOTE — Progress Notes (Signed)
Daily Session Note  Patient Details  Name: Kelly Donaldson MRN: [id number] Date of Birth: [birth year] Referring Provider:     PULMONARY REHAB OTHER RESP ORIENTATION from 04/17/2017 in [place]  Referring Provider  Dr. [name]      Encounter Date: 04/29/2017  Check In:     Session Check In - 04/29/17 1339      Check-In   Location [facility]   Staff Present [name], MS, EP, [facility], Exercise Physiologist;Decarla Siemen Luther Parody, BS, EP, Exercise Physiologist   Supervising physician immediately available to respond to emergencies See telemetry face sheet for immediately available MD   Medication changes reported     No   Fall or balance concerns reported    No   Warm-up and Cool-down Performed as group-led instruction   Resistance Training Performed Yes   VAD Patient? No     Pain Assessment   Currently in Pain? No/denies   Pain Score 0-No pain   Multiple Pain Sites No      Capillary Blood Glucose: No results found for this or any previous visit (from the past 24 hour(s)).      Exercise Prescription Changes - 04/28/17 1400      Response to Exercise   Blood Pressure (Admit) 122/80   Blood Pressure (Exercise) 136/82   Blood Pressure (Exit) 92/60   Heart Rate (Admit) 71 bpm   Heart Rate (Exercise) 92 bpm   Heart Rate (Exit) 72 bpm   Oxygen Saturation (Admit) 92 %   Oxygen Saturation (Exercise) 90 %   Oxygen Saturation (Exit) 97 %   Rating of Perceived Exertion (Exercise) 13   Perceived Dyspnea (Exercise) 13   Duration Progress to 30 minutes of  aerobic without signs/symptoms of physical distress   Intensity THRR unchanged     Progression   Progression Continue to progress workloads to maintain intensity without signs/symptoms of physical distress.     Resistance Training   Training Prescription Yes   Weight 1   Reps 10-15     Oxygen   Oxygen Continuous   Liters 6     Treadmill   MPH 1.4   Grade 0   Minutes 15   METs 2     NuStep   Level 2   SPM 16   Minutes 20   METs 3.73     Home Exercise Plan   Plans to continue exercise at Home (comment)   Frequency Add 2 additional days to program exercise sessions.      History  Smoking Status  . Former Smoker  . Packs/day: 0.25  . Years: 17.00  . Types: Cigarettes  . Quit date: 03/02/2015  Smokeless Tobacco  . Never Used    Goals Met:  Independence with exercise equipment Improved SOB with ADL's Using PLB without cueing & demonstrates good technique Exercise tolerated well No report of cardiac concerns or symptoms Strength training completed today  Goals Unmet:  Not Applicable  Comments: Check out 230   Dr. Sinda Du is Medical Director for [facility] Pulmonary Rehab.

## 2017-04-30 NOTE — Progress Notes (Signed)
Pulmonary Individual Treatment Plan  Patient Details  Name: Kelly Donaldson MRN: [id number] Date of Birth: 1956-09-28 Referring Provider:     PULMONARY REHAB OTHER RESP ORIENTATION from 04/17/2017 in [place]  Referring Provider  Dr. [name]      Initial Encounter Date:    [place] from 04/17/2017 in [place]  Date  04/17/17  Referring Provider  Dr. [name]      Visit Diagnosis: PAH (pulmonary artery hypertension) ([place])  Patient's Home Medications on Admission:   Current Outpatient Prescriptions:  .  aspirin EC 81 MG tablet, Take 1 tablet (81 mg total) by mouth daily., Disp: 90 tablet, Rfl: 3 .  cetirizine (ZYRTEC) 10 MG tablet, Take 1 tablet (10 mg total) by mouth daily., Disp: 90 tablet, Rfl: 3 .  citalopram (CELEXA) 40 MG tablet, TAKE ONE TABLET BY MOUTH ONCE DAILY FOR 30 DAYS, Disp: 30 tablet, Rfl: 2 .  HYDROcodone-acetaminophen (NORCO) 10-325 MG tablet, Take 1-2 tablets by mouth every 6 (six) hours as needed., Disp: 240 tablet, Rfl: 0 .  Ipratropium-Albuterol (COMBIVENT RESPIMAT) 20-100 MCG/ACT AERS respimat, Inhale 1 puff into the lungs every 6 (six) hours as needed for wheezing., Disp: 3 Inhaler, Rfl: 3 .  ipratropium-albuterol (DUONEB) 0.5-2.5 (3) MG/3ML SOLN, Take 3 mLs by nebulization 4 (four) times daily as needed., Disp: 360 mL, Rfl: 11 .  Macitentan (OPSUMIT) 10 MG TABS, Take 1 tablet (10 mg total) by mouth daily., Disp: 30 tablet, Rfl: 11 .  OXYGEN, 2.5 lpm with sleep and 4 lpm with exertion  [employer], Disp: , Rfl:  .  potassium chloride SA (K-DUR,KLOR-CON) 20 MEQ tablet, Take 1 tablet (20 mEq total) by mouth 2 (two) times daily., Disp: 180 tablet, Rfl: 3 .  pravastatin (PRAVACHOL) 20 MG tablet, Take 1 tablet (20 mg total) by mouth daily., Disp: 90 tablet, Rfl: 3 .  Riociguat (ADEMPAS) 2 MG TABS, Take 2 mg by mouth 3 (three) times daily., Disp: 90 tablet, Rfl: 11 .  Selexipag 200 MCG TABS, Take  1 tablet (200 mcg total) by mouth 2 (two) times daily., Disp: 60 tablet, Rfl: 6 .  torsemide (DEMADEX) 20 MG tablet, Take 1-4 tablets (20-80 mg total) by mouth daily., Disp: 360 tablet, Rfl: 3 .  Vitamin D, Ergocalciferol, (DRISDOL) 50000 units CAPS capsule, TAKE ONE CAPSULE BY MOUTH ONCE WEEKLY FOR 30 DAYS, Disp: 12 capsule, Rfl: 3 .  zolpidem (AMBIEN) 5 MG tablet, TAKE ONE TABLET BY MOUTH AT BEDTIME, Disp: 30 tablet, Rfl: 2  Past Medical History: Past Medical History:  Diagnosis Date  . Allergy   . Depression   . GERD (gastroesophageal reflux disease)   . Hyperlipidemia   . Oxygen dependent   . Shortness of breath dyspnea   . Varicose veins     Tobacco Use: History  Smoking Status  . Former Smoker  . Packs/day: 0.25  . Years: 17.00  . Types: Cigarettes  . Quit date: 03/02/2015  Smokeless Tobacco  . Never Used    Labs: Recent Review Flowsheet Data    There is no flowsheet data to display.      Capillary Blood Glucose: No results found for: GLUCAP   ADL UCSD:     Pulmonary Assessment Scores    Row Name 04/17/17 1229         ADL UCSD   ADL Phase Entry     SOB Score total 67     Rest 0     Walk  8     Stairs 5     Bath 0     Dress 2     Shop 3       CAT Score   CAT Score 16       mMRC Score   mMRC Score 4        Pulmonary Function Assessment:     Pulmonary Function Assessment - 04/17/17 1226      Pulmonary Function Tests   FVC% 76 %   FEV1% 75 %   FEV1/FVC Ratio 97   DLCO% 26 %     Initial Spirometry Results   FVC% 76 %   FEV1% 74 %   FEV1/FVC Ratio 96     Post Bronchodilator Spirometry Results   FVC% 76 %   FEV1% 74 %   FEV1/FVC Ratio 96     Breath   Bilateral Breath Sounds Clear   Shortness of Breath Yes      Exercise Target Goals:    Exercise Program Goal: Individual exercise prescription set with THRR, safety & activity barriers. Participant demonstrates ability to understand and report RPE using BORG scale, to  self-measure pulse accurately, and to acknowledge the importance of the exercise prescription.  Exercise Prescription Goal: Starting with aerobic activity 30 plus minutes a day, 3 days per week for initial exercise prescription. Provide home exercise prescription and guidelines that participant acknowledges understanding prior to discharge.  Activity Barriers & Risk Stratification:   6 Minute Walk:     6 Minute Walk    Row Name 04/17/17 1152         6 Minute Walk   Phase Initial     Distance 750 feet     Distance % Change 0 %     Walk Time 6 minutes     # of Rest Breaks 1     MPH 1.42     METS 2.08     RPE 15     Perceived Dyspnea  16     VO2 Peak 7.44     Symptoms No     Resting HR 74 bpm     Resting BP 94/50     Max Ex. HR 143 bpm     Max Ex. BP 134/62     2 Minute Post BP 106/56        Oxygen Initial Assessment:     Oxygen Initial Assessment - 04/17/17 1235      Home Oxygen   Home Oxygen Device Home Concentrator;Portable Concentrator   Sleep Oxygen Prescription None   Home Exercise Oxygen Prescription Continuous   Liters per minute 4   Home at Rest Exercise Oxygen Prescription Continuous   Liters per minute 2   Compliance with Home Oxygen Use Yes     Initial 6 min Walk   Oxygen Used Continuous   Liters per minute 4   Resting Oxygen Saturation  during 6 min walk 94 %   Exercise Oxygen Saturation  during 6 min walk 72 %  Had patient stop to rest, increase oxygen to 6 liters, SaO2 increased to 90 mg/dl. then test was resumed and patient finished the walk test.      Program Oxygen Prescription   Program Oxygen Prescription Continuous     Intervention   Short Term Goals To learn and demonstrate proper purse lipped breathing techniques or other breathing techniques.;To learn and exhibit compliance with exercise, home and travel O2 prescription;To Learn and understand importance of maintaining oxygen saturations>88%  Long  Term Goals Maintenance of O2  saturations>88%      Oxygen Re-Evaluation:   Oxygen Discharge (Final Oxygen Re-Evaluation):   Initial Exercise Prescription:     Initial Exercise Prescription - 04/17/17 1100      Date of Initial Exercise RX and Referring Provider   Date 04/17/17   Referring Provider [name]     Oxygen   Oxygen Continuous   Liters 6     Treadmill   MPH 1.4   Grade 0   Minutes 15   METs 2     NuStep   Level 2   SPM 15   Minutes 20   METs 1.8     Prescription Details   Frequency (times per week) 2   Duration Progress to 30 minutes of continuous aerobic without signs/symptoms of physical distress     Intensity   THRR 40-80% of Max Heartrate [phone]   Ratings of Perceived Exertion 11-13   Perceived Dyspnea 0-4     Progression   Progression Continue progressive overload as per policy without signs/symptoms or physical distress.     Resistance Training   Training Prescription Yes   Weight 1   Reps 10-15      Perform Capillary Blood Glucose checks as needed.  Exercise Prescription Changes:      Exercise Prescription Changes    Row Name 04/28/17 1400             Response to Exercise   Blood Pressure (Admit) 122/80       Blood Pressure (Exercise) 136/82       Blood Pressure (Exit) 92/60       Heart Rate (Admit) 71 bpm       Heart Rate (Exercise) 92 bpm       Heart Rate (Exit) 72 bpm       Oxygen Saturation (Admit) 92 %       Oxygen Saturation (Exercise) 90 %       Oxygen Saturation (Exit) 97 %       Rating of Perceived Exertion (Exercise) 13       Perceived Dyspnea (Exercise) 13       Duration Progress to 30 minutes of  aerobic without signs/symptoms of physical distress       Intensity THRR unchanged         Progression   Progression Continue to progress workloads to maintain intensity without signs/symptoms of physical distress.         Resistance Training   Training Prescription Yes       Weight 1       Reps 10-15         Oxygen   Oxygen  Continuous       Liters 6         Treadmill   MPH 1.4       Grade 0       Minutes 15       METs 2         NuStep   Level 2       SPM 16       Minutes 20       METs 3.73         Home Exercise Plan   Plans to continue exercise at Home (comment)       Frequency Add 2 additional days to program exercise sessions.          Exercise Comments:      Exercise Comments  Row Name 04/28/17 1449           Exercise Comments Patient has just recently started PR and will be progressed in time.           Exercise Goals and Review:      Exercise Goals    Row Name 04/17/17 1256             Exercise Goals   Increase Physical Activity Yes       Intervention Provide advice, education, support and counseling about physical activity/exercise needs.;Develop an individualized exercise prescription for aerobic and resistive training based on initial evaluation findings, risk stratification, comorbidities and participant's personal goals.       Expected Outcomes Achievement of increased cardiorespiratory fitness and enhanced flexibility, muscular endurance and strength shown through measurements of functional capacity and personal statement of participant.       Increase Strength and Stamina Yes       Intervention Provide advice, education, support and counseling about physical activity/exercise needs.;Develop an individualized exercise prescription for aerobic and resistive training based on initial evaluation findings, risk stratification, comorbidities and participant's personal goals.       Expected Outcomes Achievement of increased cardiorespiratory fitness and enhanced flexibility, muscular endurance and strength shown through measurements of functional capacity and personal statement of participant.          Exercise Goals Re-Evaluation :   Discharge Exercise Prescription (Final Exercise Prescription Changes):     Exercise Prescription Changes - 04/28/17 1400       Response to Exercise   Blood Pressure (Admit) 122/80   Blood Pressure (Exercise) 136/82   Blood Pressure (Exit) 92/60   Heart Rate (Admit) 71 bpm   Heart Rate (Exercise) 92 bpm   Heart Rate (Exit) 72 bpm   Oxygen Saturation (Admit) 92 %   Oxygen Saturation (Exercise) 90 %   Oxygen Saturation (Exit) 97 %   Rating of Perceived Exertion (Exercise) 13   Perceived Dyspnea (Exercise) 13   Duration Progress to 30 minutes of  aerobic without signs/symptoms of physical distress   Intensity THRR unchanged     Progression   Progression Continue to progress workloads to maintain intensity without signs/symptoms of physical distress.     Resistance Training   Training Prescription Yes   Weight 1   Reps 10-15     Oxygen   Oxygen Continuous   Liters 6     Treadmill   MPH 1.4   Grade 0   Minutes 15   METs 2     NuStep   Level 2   SPM 16   Minutes 20   METs 3.73     Home Exercise Plan   Plans to continue exercise at Home (comment)   Frequency Add 2 additional days to program exercise sessions.      Nutrition:  Target Goals: Understanding of nutrition guidelines, daily intake of sodium 1500mg , cholesterol 200mg , calories 30% from fat and 7% or less from saturated fats, daily to have 5 or more servings of fruits and vegetables.  Biometrics:     Pre Biometrics - 04/17/17 1154      Pre Biometrics   Height 5\' 7"  (1.702 m)   Weight 259 lb 0.7 oz (117.5 kg)   Waist Circumference 43 inches   Hip Circumference 52 inches   Waist to Hip Ratio 0.83 %   BMI (Calculated) 40.7   Triceps Skinfold 31 mm   % Body Fat 48 %   Grip Strength 47.86  kg   Flexibility 0 in   Single Leg Stand 2 seconds       Nutrition Therapy Plan and Nutrition Goals:   Nutrition Discharge: Rate Your Plate Scores:   Nutrition Goals Re-Evaluation:   Nutrition Goals Discharge (Final Nutrition Goals Re-Evaluation):   Psychosocial: Target Goals: Acknowledge presence or absence of significant  depression and/or stress, maximize coping skills, provide positive support system. Participant is able to verbalize types and ability to use techniques and skills needed for reducing stress and depression.  Initial Review & Psychosocial Screening:     Initial Psych Review & Screening - 04/17/17 1504      Initial Review   Current issues with Current Depression;History of Depression     Family Dynamics   Good Support System? Yes     Barriers   Psychosocial barriers to participate in program Psychosocial barriers identified (see note)  QOL overal score 16.69. Discussed counseling. Patient stated she did not feel she needed it at this time.      Screening Interventions   Interventions Encouraged to exercise      Quality of Life Scores:     Quality of Life - 04/17/17 1155      Quality of Life Scores   Health/Function Pre 14.91 %   Socioeconomic Pre 17.29 %   Psych/Spiritual Pre 19.86 %   Family Pre 17.1 %   GLOBAL Pre 16.69 %      PHQ-9: Recent Review Flowsheet Data    Depression screen [facility] 2/9 04/17/2017 04/08/2017 03/03/2017 12/13/2016 08/30/2016   Decreased Interest 0 2 1 0 1   Down, Depressed, Hopeless - 2 1 0 1   PHQ - 2 Score 0 4 2 0 2   Altered sleeping 2 1 2  - 2   Tired, decreased energy 2 1 2  - 2   Change in appetite 1 1 2  - 2   Feeling bad or failure about yourself  1 1 2  - 3   Trouble concentrating 0 1 3 - 2   Moving slowly or fidgety/restless 0 1 1 - 1   Suicidal thoughts 0 1 1 - 1   PHQ-9 Score 6 11 15  - 15     Interpretation of Total Score  Total Score Depression Severity:  1-4 = Minimal depression, 5-9 = Mild depression, 10-14 = Moderate depression, 15-19 = Moderately severe depression, 20-27 = Severe depression   Psychosocial Evaluation and Intervention:     Psychosocial Evaluation - 04/17/17 1506      Psychosocial Evaluation & Interventions   Interventions Stress management education;Relaxation education;Encouraged to exercise with the program and  follow exercise prescription   Comments Patient QOL overall scores where low 16.69. Discussed patient seeking counseling. Patient stated she did not feel she needed it at this time.    Continue Psychosocial Services  Follow up required by staff      Psychosocial Re-Evaluation:   Psychosocial Discharge (Final Psychosocial Re-Evaluation):    Education: Education Goals: Education classes will be provided on a weekly basis, covering required topics. Participant will state understanding/return demonstration of topics presented.  Learning Barriers/Preferences:     Learning Barriers/Preferences - 04/17/17 1159      Learning Barriers/Preferences   Learning Barriers None   Learning Preferences Verbal Instruction;Individual Instruction      Education Topics: How Lungs Work and Diseases: - Discuss the anatomy of the lungs and diseases that can affect the lungs, such as COPD.   Exercise: -Discuss the importance of exercise, FITT principles of  exercise, normal and abnormal responses to exercise, and how to exercise safely.   Environmental Irritants: -Discuss types of environmental irritants and how to limit exposure to environmental irritants.   PULMONARY REHAB OTHER RESPIRATORY from 04/24/2017 in [facility]  Date  04/24/17  Educator  GC  Instruction Review Code  2- meets goals/outcomes      Meds/Inhalers and oxygen: - Discuss respiratory medications, definition of an inhaler and oxygen, and the proper way to use an inhaler and oxygen.   Energy Saving Techniques: - Discuss methods to conserve energy and decrease shortness of breath when performing activities of daily living.    Bronchial Hygiene / Breathing Techniques: - Discuss breathing mechanics, pursed-lip breathing technique,  proper posture, effective ways to clear airways, and other functional breathing techniques   Cleaning Equipment: - Provides group verbal and written instruction about the  health risks of elevated stress, cause of high stress, and healthy ways to reduce stress.   Nutrition I: Fats: - Discuss the types of cholesterol, what cholesterol does to the body, and how cholesterol levels can be controlled.   Nutrition II: Labels: -Discuss the different components of food labels and how to read food labels.   Respiratory Infections: - Discuss the signs and symptoms of respiratory infections, ways to prevent respiratory infections, and the importance of seeking medical treatment when having a respiratory infection.   Stress I: Signs and Symptoms: - Discuss the causes of stress, how stress may lead to anxiety and depression, and ways to limit stress.   Stress II: Relaxation: -Discuss relaxation techniques to limit stress.   Oxygen for Home/Travel: - Discuss how to prepare for travel when on oxygen and proper ways to transport and store oxygen to ensure safety.   Knowledge Questionnaire Score:     Knowledge Questionnaire Score - 04/17/17 1226      Knowledge Questionnaire Score   Pre Score 11/14      Core Components/Risk Factors/Patient Goals at Admission:     Personal Goals and Risk Factors at Admission - 04/17/17 1257      Core Components/Risk Factors/Patient Goals on Admission    Weight Management Weight Maintenance   Improve shortness of breath with ADL's Yes   Intervention Provide education, individualized exercise plan and daily activity instruction to help decrease symptoms of SOB with activities of daily living.   Expected Outcomes Short Term: Achieves a reduction of symptoms when performing activities of daily living.   Personal Goal Other Yes   Personal Goal Breathe better, Walk longer without SOB   Intervention Attend program 2 x week and supplement at home 3 x week.    Expected Outcomes Reach personal goals      Core Components/Risk Factors/Patient Goals Review:    Core Components/Risk Factors/Patient Goals at Discharge (Final  Review):    ITP Comments:     ITP Comments    Row Name 04/30/17 1322           ITP Comments Patient new to program completing 4 sessions. Will continue to monitor for progress.           Comments: ITP 30 Day REVIEW Patient new to program completing 4 sessions. Will continue to monitor for progress.

## 2017-05-01 ENCOUNTER — Encounter (HOSPITAL_COMMUNITY)
Admission: RE | Admit: 2017-05-01 | Discharge: 2017-05-01 | Disposition: A | Discharge status: Home / Self Care | Payer: Medicare PPO | Source: Ambulatory Visit | Attending: Cardiology | Admitting: Cardiology

## 2017-05-01 DIAGNOSIS — I2721 Secondary pulmonary arterial hypertension: Secondary | ICD-10-CM | POA: Diagnosis not present

## 2017-05-01 NOTE — Progress Notes (Signed)
Daily Session Note  Patient Details  Name: Kelly Donaldson MRN: [id number] Date of Birth: Jun 14, 1956 Referring Provider:     PULMONARY REHAB OTHER RESP ORIENTATION from 04/17/2017 in [place]  Referring Provider  Dr. [name]      Encounter Date: 05/01/2017  Check In:     Session Check In - 05/01/17 1334      Check-In   Location [facility]   Staff Present [name], RN, BSN;Cashis Rill Luther Parody, BS, EP, Exercise Physiologist   Supervising physician immediately available to respond to emergencies See telemetry face sheet for immediately available MD   Medication changes reported     No   Fall or balance concerns reported    No   Warm-up and Cool-down Performed as group-led instruction   Resistance Training Performed Yes   VAD Patient? No     Pain Assessment   Currently in Pain? No/denies   Pain Score 0-No pain   Multiple Pain Sites No      Capillary Blood Glucose: No results found for this or any previous visit (from the past 24 hour(s)).    History  Smoking Status  . Former Smoker  . Packs/day: 0.25  . Years: 17.00  . Types: Cigarettes  . Quit date: 03/02/2015  Smokeless Tobacco  . Never Used    Goals Met:  Independence with exercise equipment Improved SOB with ADL's Using PLB without cueing & demonstrates good technique Exercise tolerated well No report of cardiac concerns or symptoms Strength training completed today  Goals Unmet:  Not Applicable  Comments: Check out 230   Dr. Sinda Du is Medical Director for [facility].

## 2017-05-06 ENCOUNTER — Encounter (HOSPITAL_COMMUNITY)
Admission: RE | Admit: 2017-05-06 | Discharge: 2017-05-06 | Disposition: A | Discharge status: Home / Self Care | Payer: Medicare PPO | Source: Ambulatory Visit | Attending: Cardiology | Admitting: Cardiology

## 2017-05-06 DIAGNOSIS — I2721 Secondary pulmonary arterial hypertension: Secondary | ICD-10-CM | POA: Diagnosis not present

## 2017-05-06 NOTE — Progress Notes (Signed)
Daily Session Note  Patient Details  Name: Kelly Donaldson MRN: [id number] Date of Birth: 26-Jul-1956 Referring Provider:     PULMONARY REHAB OTHER RESP ORIENTATION from 04/17/2017 in [place]  Referring Provider  Dr. [name]      Encounter Date: 05/06/2017  Check In:     Session Check In - 05/06/17 1353      Check-In   Location [facility]   Staff Present Russella Dar, MS, EP, [facility], Exercise Physiologist;Marlin Jarrard Luther Parody, BS, EP, Exercise Physiologist   Supervising physician immediately available to respond to emergencies See telemetry face sheet for immediately available MD   Medication changes reported     No   Fall or balance concerns reported    No   Warm-up and Cool-down Performed as group-led instruction   Resistance Training Performed Yes   VAD Patient? No     Pain Assessment   Currently in Pain? No/denies   Pain Score 0-No pain   Multiple Pain Sites No      Capillary Blood Glucose: No results found for this or any previous visit (from the past 24 hour(s)).    History  Smoking Status  . Former Smoker  . Packs/day: 0.25  . Years: 17.00  . Types: Cigarettes  . Quit date: 03/02/2015  Smokeless Tobacco  . Never Used    Goals Met:  Independence with exercise equipment Improved SOB with ADL's Using PLB without cueing & demonstrates good technique Exercise tolerated well No report of cardiac concerns or symptoms Strength training completed today  Goals Unmet:  Not Applicable  Comments: Check out 230   Dr. Sinda Du is Medical Director for [facility].

## 2017-05-08 ENCOUNTER — Encounter (HOSPITAL_COMMUNITY)
Admission: RE | Admit: 2017-05-08 | Discharge: 2017-05-08 | Disposition: A | Discharge status: Home / Self Care | Payer: Medicare PPO | Source: Ambulatory Visit | Attending: Cardiology | Admitting: Cardiology

## 2017-05-08 DIAGNOSIS — I2721 Secondary pulmonary arterial hypertension: Secondary | ICD-10-CM

## 2017-05-08 NOTE — Progress Notes (Signed)
Daily Session Note  Patient Details  Name: Kelly Donaldson MRN: [id number] Date of Birth: 03/18/1956 Referring Provider:     PULMONARY REHAB OTHER RESP ORIENTATION from 04/17/2017 in [place] [facility]  Referring Provider  Dr. Mclean      Encounter Date: 05/08/2017  Check In:     Session Check In - 05/08/17 1349      Check-In   Location [facility]   Staff Present Debra Johnson, RN, BSN;Jeanee Fabre, BS, EP, Exercise Physiologist   Supervising physician immediately available to respond to emergencies See telemetry face sheet for immediately available MD   Medication changes reported     No   Fall or balance concerns reported    No   Warm-up and Cool-down Performed as group-led instruction   Resistance Training Performed Yes   VAD Patient? No     Pain Assessment   Currently in Pain? No/denies   Pain Score 0-No pain   Multiple Pain Sites No      Capillary Blood Glucose: No results found for this or any previous visit (from the past 24 hour(s)).    History  Smoking Status  . Former Smoker  . Packs/day: 0.25  . Years: 17.00  . Types: Cigarettes  . Quit date: 03/02/2015  Smokeless Tobacco  . Never Used    Goals Met:  Independence with exercise equipment Improved SOB with ADL's Using PLB without cueing & demonstrates good technique Exercise tolerated well No report of cardiac concerns or symptoms Strength training completed today  Goals Unmet:  Not Applicable  Comments: Check out 230   Dr. Edward Hawkins is Medical Director for [facility]. 

## 2017-05-13 ENCOUNTER — Telehealth (HOSPITAL_COMMUNITY): Payer: Self-pay | Admitting: Pharmacist

## 2017-05-13 ENCOUNTER — Encounter (HOSPITAL_COMMUNITY)
Admission: RE | Admit: 2017-05-13 | Discharge: 2017-05-13 | Disposition: A | Discharge status: Home / Self Care | Payer: Medicare PPO | Source: Ambulatory Visit | Attending: Cardiology | Admitting: Cardiology

## 2017-05-13 DIAGNOSIS — I2721 Secondary pulmonary arterial hypertension: Secondary | ICD-10-CM | POA: Diagnosis not present

## 2017-05-13 NOTE — Progress Notes (Signed)
Daily Session Note  Patient Details  Name: DARTHULA DESA MRN: [id number] Date of Birth: [birth year] Referring Provider:     PULMONARY REHAB OTHER RESP ORIENTATION from 04/17/2017 in [place]  Referring Provider  Dr. [name]      Encounter Date: 05/13/2017  Check In:     Session Check In - 05/13/17 1342      Check-In   Location [facility]   Staff Present Russella Dar, MS, EP, [facility], Exercise Physiologist;Yariela Tison Luther Parody, BS, EP, Exercise Physiologist   Supervising physician immediately available to respond to emergencies See telemetry face sheet for immediately available MD   Medication changes reported     No   Fall or balance concerns reported    No   Warm-up and Cool-down Performed as group-led instruction   Resistance Training Performed Yes   VAD Patient? No     Pain Assessment   Currently in Pain? No/denies   Pain Score 0-No pain   Multiple Pain Sites No      Capillary Blood Glucose: No results found for this or any previous visit (from the past 24 hour(s)).    History  Smoking Status  . Former Smoker  . Packs/day: 0.25  . Years: 17.00  . Types: Cigarettes  . Quit date: 03/02/2015  Smokeless Tobacco  . Never Used    Goals Met:  Independence with exercise equipment Improved SOB with ADL's Using PLB without cueing & demonstrates good technique Exercise tolerated well No report of cardiac concerns or symptoms Strength training completed today  Goals Unmet:  Not Applicable  Comments: Check out 230   Dr. Sinda Du is Medical Director for [facility].

## 2017-05-13 NOTE — Telephone Encounter (Signed)
Uptravi 200 mcg BID PA approved by [facility] through 12/08/17.   Tyler Deis. Bonnye Fava, PharmD, BCPS, CPP Clinical Pharmacist Pager: [phone] Phone: [phone] 05/13/2017 4:13 PM

## 2017-05-15 ENCOUNTER — Encounter (HOSPITAL_COMMUNITY)
Admission: RE | Admit: 2017-05-15 | Discharge: 2017-05-15 | Disposition: A | Discharge status: Home / Self Care | Payer: Medicare PPO | Source: Ambulatory Visit | Attending: Cardiology | Admitting: Cardiology

## 2017-05-15 DIAGNOSIS — I2721 Secondary pulmonary arterial hypertension: Secondary | ICD-10-CM | POA: Diagnosis not present

## 2017-05-15 DIAGNOSIS — H2513 Age-related nuclear cataract, bilateral: Secondary | ICD-10-CM | POA: Diagnosis not present

## 2017-05-15 DIAGNOSIS — H04123 Dry eye syndrome of bilateral lacrimal glands: Secondary | ICD-10-CM | POA: Diagnosis not present

## 2017-05-15 DIAGNOSIS — H5015 Alternating exotropia: Secondary | ICD-10-CM | POA: Diagnosis not present

## 2017-05-15 NOTE — Progress Notes (Signed)
Daily Session Note  Patient Details  Name: TEKELA GARGUILO MRN: [id number] Date of Birth: Nov 03, 1956 Referring Provider:     PULMONARY REHAB OTHER RESP ORIENTATION from 04/17/2017 in [place]  Referring Provider  Dr. [name]      Encounter Date: 05/15/2017  Check In:     Session Check In - 05/15/17 1102      Check-In   Location [facility]   Staff Present [name], RN, BSN;Lurlean Kernen Luther Parody, BS, EP, Exercise Physiologist   Supervising physician immediately available to respond to emergencies See telemetry face sheet for immediately available MD   Medication changes reported     No   Fall or balance concerns reported    No   Warm-up and Cool-down Performed as group-led instruction   Resistance Training Performed Yes   VAD Patient? No     Pain Assessment   Currently in Pain? No/denies   Pain Score 0-No pain   Multiple Pain Sites No      Capillary Blood Glucose: No results found for this or any previous visit (from the past 24 hour(s)).    History  Smoking Status  . Former Smoker  . Packs/day: 0.25  . Years: 17.00  . Types: Cigarettes  . Quit date: 03/02/2015  Smokeless Tobacco  . Never Used    Goals Met:  Independence with exercise equipment Improved SOB with ADL's Using PLB without cueing & demonstrates good technique Exercise tolerated well No report of cardiac concerns or symptoms Strength training completed today  Goals Unmet:  Not Applicable  Comments: Check out 1145   Dr. Sinda Du is Medical Director for [facility].

## 2017-05-20 ENCOUNTER — Encounter (HOSPITAL_COMMUNITY)
Admission: RE | Admit: 2017-05-20 | Discharge: 2017-05-20 | Disposition: A | Discharge status: Home / Self Care | Payer: Medicare PPO | Source: Ambulatory Visit | Attending: Cardiology | Admitting: Cardiology

## 2017-05-20 DIAGNOSIS — I2721 Secondary pulmonary arterial hypertension: Secondary | ICD-10-CM

## 2017-05-20 NOTE — Progress Notes (Signed)
Daily Session Note  Patient Details  Name: TAKARA SERMONS MRN: [id number] Date of Birth: 1956-10-14 Referring Provider:     PULMONARY REHAB OTHER RESP ORIENTATION from 04/17/2017 in [place]  Referring Provider  Dr. [name]      Encounter Date: 05/20/2017  Check In:     Session Check In - 05/20/17 1348      Check-In   Location [facility]   Staff Present [name], MS, EP, [facility], Exercise Physiologist;Alexandria Shiflett Luther Parody, BS, EP, Exercise Physiologist   Supervising physician immediately available to respond to emergencies See telemetry face sheet for immediately available MD   Medication changes reported     No   Fall or balance concerns reported    No   Warm-up and Cool-down Performed as group-led instruction   Resistance Training Performed Yes   VAD Patient? No     Pain Assessment   Currently in Pain? No/denies   Pain Score 0-No pain   Multiple Pain Sites No      Capillary Blood Glucose: No results found for this or any previous visit (from the past 24 hour(s)).    History  Smoking Status  . Former Smoker  . Packs/day: 0.25  . Years: 17.00  . Types: Cigarettes  . Quit date: 03/02/2015  Smokeless Tobacco  . Never Used    Goals Met:  Independence with exercise equipment Improved SOB with ADL's Using PLB without cueing & demonstrates good technique Exercise tolerated well No report of cardiac concerns or symptoms Strength training completed today  Goals Unmet:  Not Applicable  Comments: Check out 230   Dr. Sinda Du is Medical Director for [facility].

## 2017-05-22 ENCOUNTER — Encounter (HOSPITAL_COMMUNITY)
Admission: RE | Admit: 2017-05-22 | Discharge: 2017-05-22 | Disposition: A | Discharge status: Home / Self Care | Payer: Medicare PPO | Source: Ambulatory Visit | Attending: Cardiology | Admitting: Cardiology

## 2017-05-22 DIAGNOSIS — I2721 Secondary pulmonary arterial hypertension: Secondary | ICD-10-CM

## 2017-05-22 NOTE — Progress Notes (Signed)
Daily Session Note  Patient Details  Name: Kelly Donaldson MRN: [id number] Date of Birth: 08/04/1956 Referring Provider:     PULMONARY REHAB OTHER RESP ORIENTATION from 04/17/2017 in [place]  Referring Provider  Dr. [name]      Encounter Date: 05/22/2017  Check In:     Session Check In - 05/22/17 1336      Check-In   Location [facility]   Staff Present [name], RN, BSN;Buelah Rennie Luther Parody, BS, EP, Exercise Physiologist   Supervising physician immediately available to respond to emergencies See telemetry face sheet for immediately available MD   Medication changes reported     No   Fall or balance concerns reported    No   Warm-up and Cool-down Performed as group-led instruction   Resistance Training Performed Yes   VAD Patient? No     Pain Assessment   Currently in Pain? No/denies   Pain Score 0-No pain   Multiple Pain Sites No      Capillary Blood Glucose: No results found for this or any previous visit (from the past 24 hour(s)).      Exercise Prescription Changes - 05/22/17 0800      Response to Exercise   Blood Pressure (Admit) 100/56   Blood Pressure (Exercise) 118/68   Blood Pressure (Exit) 104/66   Heart Rate (Admit) 76 bpm   Heart Rate (Exercise) 86 bpm   Heart Rate (Exit) 72 bpm   Oxygen Saturation (Admit) 90 %   Oxygen Saturation (Exercise) 95 %   Oxygen Saturation (Exit) 94 %   Rating of Perceived Exertion (Exercise) 12   Perceived Dyspnea (Exercise) 12   Duration Progress to 30 minutes of  aerobic without signs/symptoms of physical distress   Intensity THRR unchanged     Progression   Progression Continue to progress workloads to maintain intensity without signs/symptoms of physical distress.     Resistance Training   Training Prescription Yes   Weight 0   Reps 10-15     Oxygen   Oxygen Continuous   Liters 6     Treadmill   MPH 1.5   Grade 0   Minutes 15   METs 2.15     NuStep   Level 3   SPM 25   Minutes 20   METs 3.73     Home Exercise Plan   Plans to continue exercise at Home (comment)   Frequency Add 2 additional days to program exercise sessions.      History  Smoking Status  . Former Smoker  . Packs/day: 0.25  . Years: 17.00  . Types: Cigarettes  . Quit date: 03/02/2015  Smokeless Tobacco  . Never Used    Goals Met:  Independence with exercise equipment Improved SOB with ADL's Using PLB without cueing & demonstrates good technique Exercise tolerated well No report of cardiac concerns or symptoms Strength training completed today  Goals Unmet:  Not Applicable  Comments: Check out 230   Dr. Sinda Du is Medical Director for [facility].

## 2017-05-26 NOTE — Progress Notes (Signed)
Pulmonary Individual Treatment Plan  Patient Details  Name: Kelly Donaldson MRN: [id number] Date of Birth: 1956-09-28 Referring Provider:     PULMONARY REHAB OTHER RESP ORIENTATION from 04/17/2017 in [place]  Referring Provider  Dr. [name]      Initial Encounter Date:    [place] from 04/17/2017 in [place]  Date  04/17/17  Referring Provider  Dr. [name]      Visit Diagnosis: PAH (pulmonary artery hypertension) ([place])  Patient's Home Medications on Admission:   Current Outpatient Prescriptions:  .  aspirin EC 81 MG tablet, Take 1 tablet (81 mg total) by mouth daily., Disp: 90 tablet, Rfl: 3 .  cetirizine (ZYRTEC) 10 MG tablet, Take 1 tablet (10 mg total) by mouth daily., Disp: 90 tablet, Rfl: 3 .  citalopram (CELEXA) 40 MG tablet, TAKE ONE TABLET BY MOUTH ONCE DAILY FOR 30 DAYS, Disp: 30 tablet, Rfl: 2 .  HYDROcodone-acetaminophen (NORCO) 10-325 MG tablet, Take 1-2 tablets by mouth every 6 (six) hours as needed., Disp: 240 tablet, Rfl: 0 .  Ipratropium-Albuterol (COMBIVENT RESPIMAT) 20-100 MCG/ACT AERS respimat, Inhale 1 puff into the lungs every 6 (six) hours as needed for wheezing., Disp: 3 Inhaler, Rfl: 3 .  ipratropium-albuterol (DUONEB) 0.5-2.5 (3) MG/3ML SOLN, Take 3 mLs by nebulization 4 (four) times daily as needed., Disp: 360 mL, Rfl: 11 .  Macitentan (OPSUMIT) 10 MG TABS, Take 1 tablet (10 mg total) by mouth daily., Disp: 30 tablet, Rfl: 11 .  OXYGEN, 2.5 lpm with sleep and 4 lpm with exertion  [employer], Disp: , Rfl:  .  potassium chloride SA (K-DUR,KLOR-CON) 20 MEQ tablet, Take 1 tablet (20 mEq total) by mouth 2 (two) times daily., Disp: 180 tablet, Rfl: 3 .  pravastatin (PRAVACHOL) 20 MG tablet, Take 1 tablet (20 mg total) by mouth daily., Disp: 90 tablet, Rfl: 3 .  Riociguat (ADEMPAS) 2 MG TABS, Take 2 mg by mouth 3 (three) times daily., Disp: 90 tablet, Rfl: 11 .  Selexipag 200 MCG TABS, Take  1 tablet (200 mcg total) by mouth 2 (two) times daily., Disp: 60 tablet, Rfl: 6 .  torsemide (DEMADEX) 20 MG tablet, Take 1-4 tablets (20-80 mg total) by mouth daily., Disp: 360 tablet, Rfl: 3 .  Vitamin D, Ergocalciferol, (DRISDOL) 50000 units CAPS capsule, TAKE ONE CAPSULE BY MOUTH ONCE WEEKLY FOR 30 DAYS, Disp: 12 capsule, Rfl: 3 .  zolpidem (AMBIEN) 5 MG tablet, TAKE ONE TABLET BY MOUTH AT BEDTIME, Disp: 30 tablet, Rfl: 2  Past Medical History: Past Medical History:  Diagnosis Date  . Allergy   . Depression   . GERD (gastroesophageal reflux disease)   . Hyperlipidemia   . Oxygen dependent   . Shortness of breath dyspnea   . Varicose veins     Tobacco Use: History  Smoking Status  . Former Smoker  . Packs/day: 0.25  . Years: 17.00  . Types: Cigarettes  . Quit date: 03/02/2015  Smokeless Tobacco  . Never Used    Labs: Recent Review Flowsheet Data    There is no flowsheet data to display.      Capillary Blood Glucose: No results found for: GLUCAP   ADL UCSD:     Pulmonary Assessment Scores    Row Name 04/17/17 1229         ADL UCSD   ADL Phase Entry     SOB Score total 67     Rest 0     Walk  8     Stairs 5     Bath 0     Dress 2     Shop 3       CAT Score   CAT Score 16       mMRC Score   mMRC Score 4        Pulmonary Function Assessment:     Pulmonary Function Assessment - 04/17/17 1226      Pulmonary Function Tests   FVC% 76 %   FEV1% 75 %   FEV1/FVC Ratio 97   DLCO% 26 %     Initial Spirometry Results   FVC% 76 %   FEV1% 74 %   FEV1/FVC Ratio 96     Post Bronchodilator Spirometry Results   FVC% 76 %   FEV1% 74 %   FEV1/FVC Ratio 96     Breath   Bilateral Breath Sounds Clear   Shortness of Breath Yes      Exercise Target Goals:    Exercise Program Goal: Individual exercise prescription set with THRR, safety & activity barriers. Participant demonstrates ability to understand and report RPE using BORG scale, to  self-measure pulse accurately, and to acknowledge the importance of the exercise prescription.  Exercise Prescription Goal: Starting with aerobic activity 30 plus minutes a day, 3 days per week for initial exercise prescription. Provide home exercise prescription and guidelines that participant acknowledges understanding prior to discharge.  Activity Barriers & Risk Stratification:   6 Minute Walk:     6 Minute Walk    Row Name 04/17/17 1152         6 Minute Walk   Phase Initial     Distance 750 feet     Distance % Change 0 %     Walk Time 6 minutes     # of Rest Breaks 1     MPH 1.42     METS 2.08     RPE 15     Perceived Dyspnea  16     VO2 Peak 7.44     Symptoms No     Resting HR 74 bpm     Resting BP 94/50     Max Ex. HR 143 bpm     Max Ex. BP 134/62     2 Minute Post BP 106/56        Oxygen Initial Assessment:     Oxygen Initial Assessment - 04/17/17 1235      Home Oxygen   Home Oxygen Device Home Concentrator;Portable Concentrator   Sleep Oxygen Prescription None   Home Exercise Oxygen Prescription Continuous   Liters per minute 4   Home at Rest Exercise Oxygen Prescription Continuous   Liters per minute 2   Compliance with Home Oxygen Use Yes     Initial 6 min Walk   Oxygen Used Continuous   Liters per minute 4   Resting Oxygen Saturation  during 6 min walk 94 %   Exercise Oxygen Saturation  during 6 min walk 72 %  Had patient stop to rest, increase oxygen to 6 liters, SaO2 increased to 90 mg/dl. then test was resumed and patient finished the walk test.      Program Oxygen Prescription   Program Oxygen Prescription Continuous     Intervention   Short Term Goals To learn and demonstrate proper purse lipped breathing techniques or other breathing techniques.;To learn and exhibit compliance with exercise, home and travel O2 prescription;To Learn and understand importance of maintaining oxygen saturations>88%  Long  Term Goals Maintenance of O2  saturations>88%      Oxygen Re-Evaluation:     Oxygen Re-Evaluation    Row Name 05/26/17 1327             Program Oxygen Prescription   Program Oxygen Prescription Continuous       Liters per minute 2       FiO2% 91         Home Oxygen   Home Oxygen Device Home Concentrator       Sleep Oxygen Prescription Continuous       Liters per minute 2       Home Exercise Oxygen Prescription Continuous       Liters per minute 2       Home at Rest Exercise Oxygen Prescription Continuous       Liters per minute 2       Compliance with Home Oxygen Use Yes         Goals/Expected Outcomes   Short Term Goals To Learn and understand importance of maintaining oxygen saturations>88%       Long  Term Goals Maintenance of O2 saturations>88%       Comments Patient is compliant with her O2 prescription. Her O2 saturation is 91-97 at PR. Her O2 runs at 2 L/min.        Goals/Expected Outcomes Patient will continue to be compliant with her O2 prescription and continue to maintain her O2 saturation levels at 91-97.           Oxygen Discharge (Final Oxygen Re-Evaluation):     Oxygen Re-Evaluation - 05/26/17 1327      Program Oxygen Prescription   Program Oxygen Prescription Continuous   Liters per minute 2   FiO2% 91     Home Oxygen   Home Oxygen Device Home Concentrator   Sleep Oxygen Prescription Continuous   Liters per minute 2   Home Exercise Oxygen Prescription Continuous   Liters per minute 2   Home at Rest Exercise Oxygen Prescription Continuous   Liters per minute 2   Compliance with Home Oxygen Use Yes     Goals/Expected Outcomes   Short Term Goals To Learn and understand importance of maintaining oxygen saturations>88%   Long  Term Goals Maintenance of O2 saturations>88%   Comments Patient is compliant with her O2 prescription. Her O2 saturation is 91-97 at PR. Her O2 runs at 2 L/min.    Goals/Expected Outcomes Patient will continue to be compliant with her O2  prescription and continue to maintain her O2 saturation levels at 91-97.       Initial Exercise Prescription:     Initial Exercise Prescription - 04/17/17 1100      Date of Initial Exercise RX and Referring Provider   Date 04/17/17   Referring Provider Dr. [name]     Oxygen   Oxygen Continuous   Liters 6     Treadmill   MPH 1.4   Grade 0   Minutes 15   METs 2     NuStep   Level 2   SPM 15   Minutes 20   METs 1.8     Prescription Details   Frequency (times per week) 2   Duration Progress to 30 minutes of continuous aerobic without signs/symptoms of physical distress     Intensity   THRR 40-80% of Max Heartrate [phone]   Ratings of Perceived Exertion 11-13   Perceived Dyspnea 0-4     Progression  Progression Continue progressive overload as per policy without signs/symptoms or physical distress.     Resistance Training   Training Prescription Yes   Weight 1   Reps 10-15      Perform Capillary Blood Glucose checks as needed.  Exercise Prescription Changes:      Exercise Prescription Changes    Row Name 04/28/17 1400 05/13/17 1500 05/22/17 0800         Response to Exercise   Blood Pressure (Admit) 122/80 116/58 100/56     Blood Pressure (Exercise) 136/82 138/72 118/68     Blood Pressure (Exit) 92/60 124/68 104/66     Heart Rate (Admit) 71 bpm 72 bpm 76 bpm     Heart Rate (Exercise) 92 bpm 76 bpm 86 bpm     Heart Rate (Exit) 72 bpm 93 bpm 72 bpm     Oxygen Saturation (Admit) 92 % 94 % 90 %     Oxygen Saturation (Exercise) 90 % 90 % 95 %     Oxygen Saturation (Exit) 97 % 94 % 94 %     Rating of Perceived Exertion (Exercise) _0 Perceived Dyspnea (Exercise) _1 Duration Progress to 30 minutes of  aerobic without signs/symptoms of physical distress Progress to 30 minutes of  aerobic without signs/symptoms of physical distress Progress to 30 minutes of  aerobic without signs/symptoms of physical distress     Intensity THRR  unchanged THRR unchanged THRR unchanged       Progression   Progression Continue to progress workloads to maintain intensity without signs/symptoms of physical distress. Continue to progress workloads to maintain intensity without signs/symptoms of physical distress. Continue to progress workloads to maintain intensity without signs/symptoms of physical distress.       Resistance Training   Training Prescription Yes Yes Yes     Weight 1 1 0     Reps 10-15 10-15 10-15       Oxygen   Oxygen Continuous Continuous Continuous     Liters _2 Treadmill   MPH 1.4 1.4 1.5     Grade 0 0 0     Minutes _3 METs 2 2 2.15       NuStep   Level _4 SPM _5 Minutes _6 METs 3.73 3.74 3.73       Home Exercise Plan   Plans to continue exercise at Home (comment) Home (comment) Home (comment)     Frequency Add 2 additional days to program exercise sessions. Add 2 additional days to program exercise sessions. Add 2 additional days to program exercise sessions.        Exercise Comments:      Exercise Comments    Row Name 04/28/17 1449 05/13/17 1532 05/22/17 0819       Exercise Comments Patient has just recently started PR and will be progressed in time.  Patient is doing well in PR. Patient is doing well in PR.         Exercise Goals and Review:      Exercise Goals    Row Name 04/17/17 1256             Exercise Goals   Increase Physical Activity Yes       Intervention Provide advice, education, support and counseling  about physical activity/exercise needs.;Develop an individualized exercise prescription for aerobic and resistive training based on initial evaluation findings, risk stratification, comorbidities and participant's personal goals.       Expected Outcomes Achievement of increased cardiorespiratory fitness and enhanced flexibility, muscular endurance and strength shown through measurements of functional capacity and personal  statement of participant.       Increase Strength and Stamina Yes       Intervention Provide advice, education, support and counseling about physical activity/exercise needs.;Develop an individualized exercise prescription for aerobic and resistive training based on initial evaluation findings, risk stratification, comorbidities and participant's personal goals.       Expected Outcomes Achievement of increased cardiorespiratory fitness and enhanced flexibility, muscular endurance and strength shown through measurements of functional capacity and personal statement of participant.          Exercise Goals Re-Evaluation :     Exercise Goals Re-Evaluation    Row Name 05/26/17 1337             Exercise Goal Re-Evaluation   Exercise Goals Review Increase Physical Activity;Increase Strenth and Stamina       Comments After completing 11 sessions, patient is progressing with some increased strength and stamina. She says she has not increased her activity but feels the program is benefiting her.        Expected Outcomes Patient will complete the program continued increased strength, stamina, and activity.           Discharge Exercise Prescription (Final Exercise Prescription Changes):     Exercise Prescription Changes - 05/22/17 0800      Response to Exercise   Blood Pressure (Admit) 100/56   Blood Pressure (Exercise) 118/68   Blood Pressure (Exit) 104/66   Heart Rate (Admit) 76 bpm   Heart Rate (Exercise) 86 bpm   Heart Rate (Exit) 72 bpm   Oxygen Saturation (Admit) 90 %   Oxygen Saturation (Exercise) 95 %   Oxygen Saturation (Exit) 94 %   Rating of Perceived Exertion (Exercise) 12   Perceived Dyspnea (Exercise) 12   Duration Progress to 30 minutes of  aerobic without signs/symptoms of physical distress   Intensity THRR unchanged     Progression   Progression Continue to progress workloads to maintain intensity without signs/symptoms of physical distress.     Resistance Training    Training Prescription Yes   Weight 0   Reps 10-15     Oxygen   Oxygen Continuous   Liters 6     Treadmill   MPH 1.5   Grade 0   Minutes 15   METs 2.15     NuStep   Level 3   SPM 25   Minutes 20   METs 3.73     Home Exercise Plan   Plans to continue exercise at Home (comment)   Frequency Add 2 additional days to program exercise sessions.      Nutrition:  Target Goals: Understanding of nutrition guidelines, daily intake of sodium <1548m, cholesterol <2056m calories 30% from fat and 7% or less from saturated fats, daily to have 5 or more servings of fruits and vegetables.  Biometrics:     Pre Biometrics - 04/17/17 1154      Pre Biometrics   Height _0  (1.702 m)   Weight 259 lb 0.7 oz (117.5 kg)   Waist Circumference 43 inches   Hip Circumference 52 inches   Waist to Hip Ratio 0.83 %   BMI (Calculated) 40.7   Triceps  Skinfold 31 mm   % Body Fat 48 %   Grip Strength 47.86 kg   Flexibility 0 in   Single Leg Stand 2 seconds       Nutrition Therapy Plan and Nutrition Goals:   Nutrition Discharge: Rate Your Plate Scores:   Nutrition Goals Re-Evaluation:   Nutrition Goals Discharge (Final Nutrition Goals Re-Evaluation):   Psychosocial: Target Goals: Acknowledge presence or absence of significant depression and/or stress, maximize coping skills, provide positive support system. Participant is able to verbalize types and ability to use techniques and skills needed for reducing stress and depression.  Initial Review & Psychosocial Screening:     Initial Psych Review & Screening - 04/17/17 1504      Initial Review   Current issues with Current Depression;History of Depression     Family Dynamics   Good Support System? Yes     Barriers   Psychosocial barriers to participate in program Psychosocial barriers identified (see note)  QOL overal score 16.69. Discussed counseling. Patient stated she did not feel she needed it at this time.       Screening Interventions   Interventions Encouraged to exercise      Quality of Life Scores:     Quality of Life - 04/17/17 1155      Quality of Life Scores   Health/Function Pre 14.91 %   Socioeconomic Pre 17.29 %   Psych/Spiritual Pre 19.86 %   Family Pre 17.1 %   GLOBAL Pre 16.69 %      PHQ-9: Recent Review Flowsheet Data    Depression screen [facility] 2/9 04/17/2017 04/08/2017 03/03/2017 12/13/2016 08/30/2016   Decreased Interest 0 2 1 0 1   Down, Depressed, Hopeless - 2 1 0 1   PHQ - 2 Score 0 4 2 0 2   Altered sleeping _0 - 2   Tired, decreased energy _1 - 2   Change in appetite _2 - 2   Feeling bad or failure about yourself  _3 - 3   Trouble concentrating 0 1 3 - 2   Moving slowly or fidgety/restless 0 1 1 - 1   Suicidal thoughts 0 1 1 - 1   PHQ-9 Score _4 - 15     Interpretation of Total Score  Total Score Depression Severity:  1-4 = Minimal depression, 5-9 = Mild depression, 10-14 = Moderate depression, 15-19 = Moderately severe depression, 20-27 = Severe depression   Psychosocial Evaluation and Intervention:     Psychosocial Evaluation - 04/17/17 1506      Psychosocial Evaluation & Interventions   Interventions Stress management education;Relaxation education;Encouraged to exercise with the program and follow exercise prescription   Comments Patient QOL overall scores where low 16.69. Discussed patient seeking counseling. Patient stated she did not feel she needed it at this time.    Continue Psychosocial Services  Follow up required by staff      Psychosocial Re-Evaluation:     Psychosocial Re-Evaluation    [place] Name 05/26/17 1332             Psychosocial Re-Evaluation   Current issues with Current Depression;History of Depression       Comments Patient's initial QOL score was 16.69 and her PHQ-9 score was 7. She is currently being treated with Celexa 40 mg. She is not interested in counseling but feels her depression is managed on  medication.        Expected Outcomes Patient's QOL  and PHQ-9 scores will improve or remain the same at discharge.        Interventions Relaxation education;Stress management education;Encouraged to attend Pulmonary Rehabilitation for the exercise       Continue Psychosocial Services  Follow up required by staff          Psychosocial Discharge (Final Psychosocial Re-Evaluation):     Psychosocial Re-Evaluation - 05/26/17 1332      Psychosocial Re-Evaluation   Current issues with Current Depression;History of Depression   Comments Patient's initial QOL score was 16.69 and her PHQ-9 score was 7. She is currently being treated with Celexa 40 mg. She is not interested in counseling but feels her depression is managed on medication.    Expected Outcomes Patient's QOL and PHQ-9 scores will improve or remain the same at discharge.    Interventions Relaxation education;Stress management education;Encouraged to attend Pulmonary Rehabilitation for the exercise   Continue Psychosocial Services  Follow up required by staff       Education: Education Goals: Education classes will be provided on a weekly basis, covering required topics. Participant will state understanding/return demonstration of topics presented.  Learning Barriers/Preferences:     Learning Barriers/Preferences - 04/17/17 1159      Learning Barriers/Preferences   Learning Barriers None   Learning Preferences Verbal Instruction;Individual Instruction      Education Topics: How Lungs Work and Diseases: - Discuss the anatomy of the lungs and diseases that can affect the lungs, such as COPD.   Exercise: -Discuss the importance of exercise, FITT principles of exercise, normal and abnormal responses to exercise, and how to exercise safely.   Environmental Irritants: -Discuss types of environmental irritants and how to limit exposure to environmental irritants.   PULMONARY REHAB OTHER RESPIRATORY from 05/22/2017 in [place]  Date  04/24/17  Educator  [place]  Instruction Review Code  2- meets goals/outcomes      Meds/Inhalers and oxygen: - Discuss respiratory medications, definition of an inhaler and oxygen, and the proper way to use an inhaler and oxygen.   PULMONARY REHAB OTHER RESPIRATORY from 05/22/2017 in [place]  Date  05/01/17  Educator  [place]  Instruction Review Code  2- meets goals/outcomes      Energy Saving Techniques: - Discuss methods to conserve energy and decrease shortness of breath when performing activities of daily living.    PULMONARY REHAB OTHER RESPIRATORY from 05/22/2017 in [place]  Date  05/08/17  Educator  [place]  Instruction Review Code  2- meets goals/outcomes      Bronchial Hygiene / Breathing Techniques: - Discuss breathing mechanics, pursed-lip breathing technique,  proper posture, effective ways to clear airways, and other functional breathing techniques   PULMONARY REHAB OTHER RESPIRATORY from 05/22/2017 in [place]  Date  05/15/17  Educator  [place]  Instruction Review Code  2- meets goals/outcomes      Cleaning Equipment: - Provides group verbal and written instruction about the health risks of elevated stress, cause of high stress, and healthy ways to reduce stress.   PULMONARY REHAB OTHER RESPIRATORY from 05/22/2017 in [place]  Date  05/22/17  Educator  [place]  Instruction Review Code  2- meets goals/outcomes      Nutrition I: Fats: - Discuss the types of cholesterol, what cholesterol does to the body, and how cholesterol levels can be controlled.   Nutrition II: Labels: -Discuss the different components of food labels and how to read food  labels.   Respiratory Infections: - Discuss the signs and symptoms of respiratory infections, ways to prevent respiratory infections, and the importance of seeking medical treatment when having a respiratory  infection.   Stress I: Signs and Symptoms: - Discuss the causes of stress, how stress may lead to anxiety and depression, and ways to limit stress.   Stress II: Relaxation: -Discuss relaxation techniques to limit stress.   Oxygen for Home/Travel: - Discuss how to prepare for travel when on oxygen and proper ways to transport and store oxygen to ensure safety.   Knowledge Questionnaire Score:     Knowledge Questionnaire Score - 04/17/17 1226      Knowledge Questionnaire Score   Pre Score 11/14      Core Components/Risk Factors/Patient Goals at Admission:     Personal Goals and Risk Factors at Admission - 04/17/17 1257      Core Components/Risk Factors/Patient Goals on Admission    Weight Management Weight Maintenance   Improve shortness of breath with ADL's Yes   Intervention Provide education, individualized exercise plan and daily activity instruction to help decrease symptoms of SOB with activities of daily living.   Expected Outcomes Short Term: Achieves a reduction of symptoms when performing activities of daily living.   Personal Goal Other Yes   Personal Goal Breathe better, Walk longer without SOB   Intervention Attend program 2 x week and supplement at home 3 x week.    Expected Outcomes Reach personal goals      Core Components/Risk Factors/Patient Goals Review:      Goals and Risk Factor Review    Row Name 05/26/17 1329             Core Components/Risk Factors/Patient Goals Review   Personal Goals Review Weight Management/Obesity;Improve shortness of breath with ADL's  Breathe better; walk longer.        Review Patient has completed 11 sessions losing 5.3 lbs. She is progressing in the program. Her O2 usage remains the same. She reports no improvement in her SOB but she does feel stronger and better overall. Will continue to monitor for progress.        Expected Outcomes Patient will complete the program and continue to work toward meeting her  personal goals.           Core Components/Risk Factors/Patient Goals at Discharge (Final Review):      Goals and Risk Factor Review - 05/26/17 1329      Core Components/Risk Factors/Patient Goals Review   Personal Goals Review Weight Management/Obesity;Improve shortness of breath with ADL's  Breathe better; walk longer.    Review Patient has completed 11 sessions losing 5.3 lbs. She is progressing in the program. Her O2 usage remains the same. She reports no improvement in her SOB but she does feel stronger and better overall. Will continue to monitor for progress.    Expected Outcomes Patient will complete the program and continue to work toward meeting her personal goals.       ITP Comments:     ITP Comments    Row Name 04/30/17 1322 05/15/17 1247         ITP Comments Patient new to program completing 4 sessions. Will continue to monitor for progress.  Patient met with Registered Dietitian to discuss nutrition topics including: Heart healthty eating, heart healthy cooking and making smart choices when shopping; Portion control; weight management; and hydration. Patient attended a group session with the hospital chaplian called [facility] to discuss and  share how this recent diagnosis has effected their life         Comments: ITP 30 Day REVIEW Pt is making expected progress toward pulmonary rehab goals after completing 11 sessions. Recommend continued exercise, life style modification, education, and utilization of breathing techniques to increase stamina and strength and decrease shortness of breath with exertion.

## 2017-05-27 ENCOUNTER — Encounter (HOSPITAL_COMMUNITY)
Admission: RE | Admit: 2017-05-27 | Discharge: 2017-05-27 | Disposition: A | Discharge status: Home / Self Care | Payer: Medicare PPO | Source: Ambulatory Visit | Attending: Cardiology | Admitting: Cardiology

## 2017-05-27 DIAGNOSIS — I2721 Secondary pulmonary arterial hypertension: Secondary | ICD-10-CM

## 2017-05-27 NOTE — Progress Notes (Signed)
Daily Session Note  Patient Details  Name: Kelly Donaldson MRN: [id number] Date of Birth: 11-06-1956 Referring Provider:     PULMONARY REHAB OTHER RESP ORIENTATION from 04/17/2017 in [facility]  Referring Provider  Dr. [name]      Encounter Date: 05/27/2017  Check In:     Session Check In - 05/27/17 1346      Check-In   Location [facility]   Staff Present [name], MS, EP, [facility], Exercise Physiologist;[name], BS, EP, Exercise Physiologist   Supervising physician immediately available to respond to emergencies See telemetry face sheet for immediately available MD   Medication changes reported     No   Fall or balance concerns reported    No   Warm-up and Cool-down Performed as group-led instruction   Resistance Training Performed Yes   VAD Patient? No     Pain Assessment   Currently in Pain? No/denies   Pain Score 0-No pain   Multiple Pain Sites No      Capillary Blood Glucose: No results found for this or any previous visit (from the past 24 hour(s)).    History  Smoking Status  . Former Smoker  . Packs/day: 0.25  . Years: 17.00  . Types: Cigarettes  . Quit date: 03/02/2015  Smokeless Tobacco  . Never Used    Goals Met:  Independence with exercise equipment Improved SOB with ADL's Using PLB without cueing & demonstrates good technique Exercise tolerated well No report of cardiac concerns or symptoms Strength training completed today  Goals Unmet:  Not Applicable  Comments: Check out 230   Dr. Sinda Du is Medical Director for [facility].

## 2017-05-28 ENCOUNTER — Ambulatory Visit (INDEPENDENT_AMBULATORY_CARE_PROVIDER_SITE_OTHER): Payer: Medicare PPO | Admitting: Physician Assistant

## 2017-05-28 VITALS — BP 100/57 | HR 92 | Temp 96.7°F | Ht 67.0 in | Wt 255.4 lb

## 2017-05-28 DIAGNOSIS — J40 Bronchitis, not specified as acute or chronic: Secondary | ICD-10-CM | POA: Diagnosis not present

## 2017-05-28 DIAGNOSIS — J9611 Chronic respiratory failure with hypoxia: Secondary | ICD-10-CM

## 2017-05-28 DIAGNOSIS — I739 Peripheral vascular disease, unspecified: Secondary | ICD-10-CM | POA: Diagnosis not present

## 2017-05-28 DIAGNOSIS — M5136 Other intervertebral disc degeneration, lumbar region: Secondary | ICD-10-CM

## 2017-05-28 DIAGNOSIS — I872 Venous insufficiency (chronic) (peripheral): Secondary | ICD-10-CM | POA: Diagnosis not present

## 2017-05-28 MED ORDER — HYDROCODONE-ACETAMINOPHEN 10-325 MG PO TABS: 1.0000 | ORAL_TABLET | Freq: Four times a day (QID) | ORAL | 0 refills | Status: DC | PRN

## 2017-05-28 MED ORDER — MELOXICAM 15 MG PO TABS: 15.0000 mg | ORAL_TABLET | Freq: Every day | ORAL | 11 refills | Status: DC

## 2017-05-28 MED ORDER — HYDROCODONE-HOMATROPINE 5-1.5 MG/5ML PO SYRP: 5.0000 mL | ORAL_SOLUTION | Freq: Four times a day (QID) | ORAL | 0 refills | Status: DC | PRN

## 2017-05-28 MED ORDER — CEFDINIR 300 MG PO CAPS: 300.0000 mg | ORAL_CAPSULE | Freq: Two times a day (BID) | ORAL | 0 refills | Status: DC

## 2017-05-29 ENCOUNTER — Encounter (HOSPITAL_COMMUNITY): Payer: Medicare PPO

## 2017-05-30 ENCOUNTER — Encounter: Payer: Self-pay | Admitting: Physician Assistant

## 2017-05-30 NOTE — Progress Notes (Signed)
BP (!) 100/57   Pulse 92   Temp (!) 96.7 F (35.9 C) (Oral)   Ht 5\' 7"  (1.702 m)   Wt 255 lb 6.4 oz (115.8 kg)   BMI 40.00 kg/m    Subjective:    Patient ID: Kelly Donaldson, female    DOB: Jan 17, 1956, 61 y.o.   MRN: [id number]  HPI: Kelly Donaldson is a 61 y.o. female presenting on 05/28/2017 for Follow-up (3 month )  This patient comes in for periodic recheck on medications and conditions including Degenerative disc disease, chronic respiratory failure, peripheral vascular disease, venous insufficiency. She is currently having some arthritis. She is also here for recheck on her medications.  Chronic pain is under control, patient has no new complaints. Medications are keeping things stable. Needs refills for the next three months.  [place] Controlled Substance website checked and normal. Drug screen normal this year. Patient with several days of progressing upper respiratory and bronchial symptoms. Initially there was more upper respiratory congestion. This progressed to having significant cough that is productive throughout the day and severe at night. There is occasional wheezing after coughing. Sometimes there is slight dyspnea on exertion. It is productive mucus that is yellow in color. Denies any blood.   All medications are reviewed today. There are no reports of any problems with the medications. All of the medical conditions are reviewed and updated.  Lab work is reviewed and will be ordered as medically necessary. There are no new problems reported with today's visit.   Relevant past medical, surgical, family and social history reviewed and updated as indicated. Allergies and medications reviewed and updated.  Past Medical History:  Diagnosis Date  . Allergy   . Depression   . GERD (gastroesophageal reflux disease)   . Hyperlipidemia   . Oxygen dependent   . Shortness of breath dyspnea   . Varicose veins     Past Surgical History:  Procedure Laterality Date  . CARDIAC  CATHETERIZATION N/A 04/20/2015   Procedure: Right Heart Cath;  Surgeon: Laurey Morale, MD;  Location: [facility] INVASIVE CV LAB;  Service: Cardiovascular;  Laterality: N/A;  . TEE WITHOUT CARDIOVERSION N/A 05/11/2015   Procedure: TRANSESOPHAGEAL ECHOCARDIOGRAM (TEE);  Surgeon: Laurey Morale, MD;  Location: [facility] ENDOSCOPY;  Service: Cardiovascular;  Laterality: N/A;  . TUBAL LIGATION     11/84    Review of Systems  Constitutional: Positive for fatigue. Negative for activity change, appetite change, chills and fever.  HENT: Positive for congestion, postnasal drip and sore throat.   Eyes: Negative.   Respiratory: Positive for cough and wheezing.   Cardiovascular: Negative.  Negative for chest pain, palpitations and leg swelling.  Gastrointestinal: Negative.   Genitourinary: Negative.   Musculoskeletal: Negative.   Skin: Negative.   Neurological: Positive for headaches.    Allergies as of 05/28/2017      Reactions   Gabapentin    Pt states that she can not take with antidepressants   Latex Rash      Medication List       Accurate as of 05/28/17 11:59 PM. Always use your most recent med list.          aspirin EC 81 MG tablet Take 1 tablet (81 mg total) by mouth daily.   cefdinir 300 MG capsule Commonly known as:  OMNICEF Take 1 capsule (300 mg total) by mouth 2 (two) times daily. 1 po BID   cetirizine 10 MG tablet Commonly known as:  ZYRTEC Take 1  tablet (10 mg total) by mouth daily.   citalopram 40 MG tablet Commonly known as:  CELEXA TAKE ONE TABLET BY MOUTH ONCE DAILY FOR 30 DAYS   HYDROcodone-acetaminophen 10-325 MG tablet Commonly known as:  NORCO Take 1-2 tablets by mouth every 6 (six) hours as needed.   HYDROcodone-acetaminophen 10-325 MG tablet Commonly known as:  NORCO Take 1-2 tablets by mouth every 6 (six) hours as needed.   HYDROcodone-acetaminophen 10-325 MG tablet Commonly known as:  NORCO Take 1-2 tablets by mouth every 6 (six) hours as needed.     HYDROcodone-homatropine 5-1.5 MG/5ML syrup Commonly known as:  HYCODAN Take 5-10 mLs by mouth every 6 (six) hours as needed.   ipratropium-albuterol 0.5-2.5 (3) MG/3ML Soln Commonly known as:  DUONEB Take 3 mLs by nebulization 4 (four) times daily as needed.   Ipratropium-Albuterol 20-100 MCG/ACT Aers respimat Commonly known as:  COMBIVENT RESPIMAT Inhale 1 puff into the lungs every 6 (six) hours as needed for wheezing.   Macitentan 10 MG Tabs Commonly known as:  OPSUMIT Take 1 tablet (10 mg total) by mouth daily.   meloxicam 15 MG tablet Commonly known as:  MOBIC Take 1 tablet (15 mg total) by mouth daily.   OXYGEN 2.5 lpm with sleep and 4 lpm with exertion  [facility]   potassium chloride SA 20 MEQ tablet Commonly known as:  K-DUR,KLOR-CON Take 1 tablet (20 mEq total) by mouth 2 (two) times daily.   pravastatin 20 MG tablet Commonly known as:  PRAVACHOL Take 1 tablet (20 mg total) by mouth daily.   Riociguat 2 MG Tabs Commonly known as:  ADEMPAS Take 2 mg by mouth 3 (three) times daily.   Selexipag 200 MCG Tabs Take 1 tablet (200 mcg total) by mouth 2 (two) times daily.   torsemide 20 MG tablet Commonly known as:  DEMADEX Take 1-4 tablets (20-80 mg total) by mouth daily.   Vitamin D (Ergocalciferol) 50000 units Caps capsule Commonly known as:  DRISDOL TAKE ONE CAPSULE BY MOUTH ONCE WEEKLY FOR 30 DAYS   zolpidem 5 MG tablet Commonly known as:  AMBIEN TAKE ONE TABLET BY MOUTH AT BEDTIME          Objective:    BP (!) 100/57   Pulse 92   Temp (!) 96.7 F (35.9 C) (Oral)   Ht 5\' 7"  (1.702 m)   Wt 255 lb 6.4 oz (115.8 kg)   BMI 40.00 kg/m   Allergies  Allergen Reactions  . Gabapentin     Pt states that she can not take with antidepressants  . Latex Rash    Physical Exam  Constitutional: She is oriented to person, place, and time. She appears well-developed and well-nourished.  HENT:  Head: Normocephalic and atraumatic.  Right Ear: No drainage  or tenderness.  Left Ear: No drainage or tenderness.  Nose: Mucosal edema and rhinorrhea present. Right sinus exhibits no maxillary sinus tenderness and no frontal sinus tenderness. Left sinus exhibits no maxillary sinus tenderness and no frontal sinus tenderness.  Mouth/Throat: Oropharyngeal exudate and posterior oropharyngeal erythema present.  Eyes: Conjunctivae and EOM are normal. Pupils are equal, round, and reactive to light.  Neck: Normal range of motion. Neck supple.  Cardiovascular: Normal rate, regular rhythm, normal heart sounds and intact distal pulses.   Pulmonary/Chest: Effort normal. She has wheezes in the right upper field and the left upper field.  Abdominal: Soft. Bowel sounds are normal.  Neurological: She is alert and oriented to person, place, and time. She has  normal reflexes.  Skin: Skin is warm and dry. No rash noted.  Psychiatric: She has a normal mood and affect. Her behavior is normal. Judgment and thought content normal.    Results for orders placed or performed during the hospital encounter of 03/21/17  Basic metabolic panel  Result Value Ref Range   Sodium 139 135 - 145 mmol/L   Potassium 4.1 3.5 - 5.1 mmol/L   Chloride 101 101 - 111 mmol/L   CO2 26 22 - 32 mmol/L   Glucose, Bld 148 (H) 65 - 99 mg/dL   BUN 22 (H) 6 - 20 mg/dL   Creatinine, Ser 2.77 (H) 0.44 - 1.00 mg/dL   Calcium 9.0 8.9 - 82.4 mg/dL   GFR calc non Af Amer 46 (L) >60 mL/min   GFR calc Af Amer 53 (L) >60 mL/min   Anion gap 12 5 - 15  B Nat Peptide  Result Value Ref Range   B Natriuretic Peptide 25.9 0.0 - 100.0 pg/mL      Assessment & Plan:   1. DDD (degenerative disc disease), lumbar - HYDROcodone-acetaminophen (NORCO) 10-325 MG tablet; Take 1-2 tablets by mouth every 6 (six) hours as needed.  Dispense: 240 tablet; Refill: 0 - HYDROcodone-acetaminophen (NORCO) 10-325 MG tablet; Take 1-2 tablets by mouth every 6 (six) hours as needed.  Dispense: 240 tablet; Refill: 0 -  HYDROcodone-acetaminophen (NORCO) 10-325 MG tablet; Take 1-2 tablets by mouth every 6 (six) hours as needed.  Dispense: 240 tablet; Refill: 0 - meloxicam (MOBIC) 15 MG tablet; Take 1 tablet (15 mg total) by mouth daily.  Dispense: 30 tablet; Refill: 11  2. Chronic venous insufficiency  3. Peripheral vascular disease, unspecified (HCC)  4. Chronic respiratory failure with hypoxia (HCC)  5. Bronchitis - cefdinir (OMNICEF) 300 MG capsule; Take 1 capsule (300 mg total) by mouth 2 (two) times daily. 1 po BID  Dispense: 20 capsule; Refill: 0 - HYDROcodone-homatropine (HYCODAN) 5-1.5 MG/5ML syrup; Take 5-10 mLs by mouth every 6 (six) hours as needed.  Dispense: 240 mL; Refill: 0   Current Outpatient Prescriptions:  .  aspirin EC 81 MG tablet, Take 1 tablet (81 mg total) by mouth daily., Disp: 90 tablet, Rfl: 3 .  cetirizine (ZYRTEC) 10 MG tablet, Take 1 tablet (10 mg total) by mouth daily., Disp: 90 tablet, Rfl: 3 .  citalopram (CELEXA) 40 MG tablet, TAKE ONE TABLET BY MOUTH ONCE DAILY FOR 30 DAYS, Disp: 30 tablet, Rfl: 2 .  HYDROcodone-acetaminophen (NORCO) 10-325 MG tablet, Take 1-2 tablets by mouth every 6 (six) hours as needed., Disp: 240 tablet, Rfl: 0 .  Ipratropium-Albuterol (COMBIVENT RESPIMAT) 20-100 MCG/ACT AERS respimat, Inhale 1 puff into the lungs every 6 (six) hours as needed for wheezing., Disp: 3 Inhaler, Rfl: 3 .  ipratropium-albuterol (DUONEB) 0.5-2.5 (3) MG/3ML SOLN, Take 3 mLs by nebulization 4 (four) times daily as needed., Disp: 360 mL, Rfl: 11 .  Macitentan (OPSUMIT) 10 MG TABS, Take 1 tablet (10 mg total) by mouth daily., Disp: 30 tablet, Rfl: 11 .  meloxicam (MOBIC) 15 MG tablet, Take 1 tablet (15 mg total) by mouth daily., Disp: 30 tablet, Rfl: 11 .  OXYGEN, 2.5 lpm with sleep and 4 lpm with exertion  [facility], Disp: , Rfl:  .  potassium chloride SA (K-DUR,KLOR-CON) 20 MEQ tablet, Take 1 tablet (20 mEq total) by mouth 2 (two) times daily., Disp: 180 tablet, Rfl: 3 .   pravastatin (PRAVACHOL) 20 MG tablet, Take 1 tablet (20 mg total) by mouth daily., Disp:  90 tablet, Rfl: 3 .  Riociguat (ADEMPAS) 2 MG TABS, Take 2 mg by mouth 3 (three) times daily., Disp: 90 tablet, Rfl: 11 .  Selexipag 200 MCG TABS, Take 1 tablet (200 mcg total) by mouth 2 (two) times daily., Disp: 60 tablet, Rfl: 6 .  torsemide (DEMADEX) 20 MG tablet, Take 1-4 tablets (20-80 mg total) by mouth daily., Disp: 360 tablet, Rfl: 3 .  Vitamin D, Ergocalciferol, (DRISDOL) 50000 units CAPS capsule, TAKE ONE CAPSULE BY MOUTH ONCE WEEKLY FOR 30 DAYS, Disp: 12 capsule, Rfl: 3 .  zolpidem (AMBIEN) 5 MG tablet, TAKE ONE TABLET BY MOUTH AT BEDTIME, Disp: 30 tablet, Rfl: 2 .  cefdinir (OMNICEF) 300 MG capsule, Take 1 capsule (300 mg total) by mouth 2 (two) times daily. 1 po BID, Disp: 20 capsule, Rfl: 0 .  HYDROcodone-acetaminophen (NORCO) 10-325 MG tablet, Take 1-2 tablets by mouth every 6 (six) hours as needed., Disp: 240 tablet, Rfl: 0 .  HYDROcodone-acetaminophen (NORCO) 10-325 MG tablet, Take 1-2 tablets by mouth every 6 (six) hours as needed., Disp: 240 tablet, Rfl: 0 .  HYDROcodone-homatropine (HYCODAN) 5-1.5 MG/5ML syrup, Take 5-10 mLs by mouth every 6 (six) hours as needed., Disp: 240 mL, Rfl: 0  Continue all other maintenance medications as listed above.  Follow up plan: Return in about 3 months (around 08/28/2017).  Educational handout given for survey  Remus Loffler PA-C [facility] [address]  [place], [place] [address] [phone]   05/30/2017, 1:48 PM

## 2017-06-03 ENCOUNTER — Encounter (HOSPITAL_COMMUNITY)
Admission: RE | Admit: 2017-06-03 | Discharge: 2017-06-03 | Disposition: A | Discharge status: Home / Self Care | Payer: Medicare PPO | Source: Ambulatory Visit | Attending: Cardiology | Admitting: Cardiology

## 2017-06-03 DIAGNOSIS — I2721 Secondary pulmonary arterial hypertension: Secondary | ICD-10-CM

## 2017-06-03 NOTE — Progress Notes (Signed)
Daily Session Note  Patient Details  Name: Kelly Donaldson MRN: [id number] Date of Birth: [birth year] Referring Provider:     PULMONARY REHAB OTHER RESP ORIENTATION from 04/17/2017 in [facility]  Referring Provider  Dr. [name]      Encounter Date: 06/03/2017  Check In:     Session Check In - 06/03/17 1344      Check-In   Location [facility]   Staff Present [name], MS, EP, [facility], Exercise Physiologist;Saxton Chain Luther Parody, BS, EP, Exercise Physiologist   Supervising physician immediately available to respond to emergencies See telemetry face sheet for immediately available MD   Medication changes reported     No   Fall or balance concerns reported    No   Warm-up and Cool-down Performed as group-led instruction   Resistance Training Performed Yes   VAD Patient? No     Pain Assessment   Currently in Pain? No/denies   Pain Score 0-No pain   Multiple Pain Sites No      Capillary Blood Glucose: No results found for this or any previous visit (from the past 24 hour(s)).    History  Smoking Status  . Former Smoker  . Packs/day: 0.25  . Years: 17.00  . Types: Cigarettes  . Quit date: 03/02/2015  Smokeless Tobacco  . Never Used    Goals Met:  Independence with exercise equipment Improved SOB with ADL's Using PLB without cueing & demonstrates good technique Exercise tolerated well No report of cardiac concerns or symptoms Strength training completed today  Goals Unmet:  Not Applicable  Comments: Check out 230   Dr. Sinda Du is Medical Director for [facility] Pulmonary Rehab.

## 2017-06-05 ENCOUNTER — Encounter (HOSPITAL_COMMUNITY): Payer: Self-pay | Admitting: *Deleted

## 2017-06-05 ENCOUNTER — Ambulatory Visit (HOSPITAL_COMMUNITY)
Admission: RE | Admit: 2017-06-05 | Discharge: 2017-06-05 | Disposition: A | Discharge status: Home / Self Care | Payer: Medicare PPO | Source: Ambulatory Visit | Attending: Physician Assistant | Admitting: Physician Assistant

## 2017-06-05 ENCOUNTER — Ambulatory Visit (HOSPITAL_BASED_OUTPATIENT_CLINIC_OR_DEPARTMENT_OTHER)
Admission: RE | Admit: 2017-06-05 | Discharge: 2017-06-05 | Disposition: A | Discharge status: Home / Self Care | Payer: Medicare PPO | Source: Ambulatory Visit | Attending: Cardiology | Admitting: Cardiology

## 2017-06-05 ENCOUNTER — Other Ambulatory Visit (HOSPITAL_COMMUNITY): Payer: Self-pay | Admitting: *Deleted

## 2017-06-05 ENCOUNTER — Encounter (HOSPITAL_COMMUNITY): Admission: RE | Admit: 2017-06-05 | Payer: Medicare PPO | Source: Ambulatory Visit

## 2017-06-05 VITALS — BP 128/55 | HR 68 | Wt 255.8 lb

## 2017-06-05 DIAGNOSIS — I371 Nonrheumatic pulmonary valve insufficiency: Secondary | ICD-10-CM | POA: Insufficient documentation

## 2017-06-05 DIAGNOSIS — I2721 Secondary pulmonary arterial hypertension: Secondary | ICD-10-CM

## 2017-06-05 DIAGNOSIS — I071 Rheumatic tricuspid insufficiency: Secondary | ICD-10-CM | POA: Diagnosis not present

## 2017-06-05 DIAGNOSIS — J9611 Chronic respiratory failure with hypoxia: Secondary | ICD-10-CM

## 2017-06-05 DIAGNOSIS — I5032 Chronic diastolic (congestive) heart failure: Secondary | ICD-10-CM

## 2017-06-05 DIAGNOSIS — I270 Primary pulmonary hypertension: Secondary | ICD-10-CM

## 2017-06-05 LAB — ECHOCARDIOGRAM COMPLETE
AVLVOTPG: 8 mmHg
CHL CUP DOP CALC LVOT VTI: 31 cm
CHL CUP TV REG PEAK VELOCITY: 379 cm/s
E/e' ratio: 9.06
EWDT: 282 ms
FS: 36 % (ref 28–44)
IV/PV OW: 0.88
LA ID, A-P, ES: 48 mm
LA diam index: 2 cm/m2
LA vol A4C: 47.1 ml
LA vol: 55 mL
LAVOLIN: 23 mL/m2
LDCA: 2.84 cm2
LEFT ATRIUM END SYS DIAM: 48 mm
LV E/e' medial: 9.06
LV E/e'average: 9.06
LV PW d: 8.36 mm — AB (ref 0.6–1.1)
LV TDI E'MEDIAL: 11.2
LVELAT: 13.9 cm/s
LVOT SV: 88 mL
LVOTD: 19 mm
LVOTPV: 141 cm/s
Lateral S' vel: 19.5 cm/s
MV Dec: 282
MV Peak grad: 6 mmHg
MV pk A vel: 113 m/s
MV pk E vel: 126 m/s
RV sys press: 72 mmHg
TAPSE: 31 mm
TDI e' lateral: 13.9
TRMAXVEL: 379 cm/s

## 2017-06-05 LAB — BASIC METABOLIC PANEL
Anion gap: 8 (ref 5–15)
BUN: 23 mg/dL — AB (ref 6–20)
CHLORIDE: 102 mmol/L (ref 101–111)
CO2: 27 mmol/L (ref 22–32)
CREATININE: 1.32 mg/dL — AB (ref 0.44–1.00)
Calcium: 8.8 mg/dL — ABNORMAL LOW (ref 8.9–10.3)
GFR calc Af Amer: 49 mL/min — ABNORMAL LOW (ref >60)
GFR, EST NON AFRICAN AMERICAN: 43 mL/min — AB (ref >60)
GLUCOSE: 101 mg/dL — AB (ref 65–99)
Potassium: 4.6 mmol/L (ref 3.5–5.1)
SODIUM: 137 mmol/L (ref 135–145)

## 2017-06-05 LAB — BRAIN NATRIURETIC PEPTIDE: B NATRIURETIC PEPTIDE 5: 44.8 pg/mL (ref 0.0–100.0)

## 2017-06-05 MED ORDER — TORSEMIDE 20 MG PO TABS: 40.0000 mg | ORAL_TABLET | Freq: Every day | ORAL | 3 refills | Status: DC

## 2017-06-05 NOTE — Patient Instructions (Signed)
INCREASE Torsemide to 40 mg (2 Tablets) Once Daily  Labs today (will call for abnormal results, otherwise no news is good news)  You have been scheduled for a Right Heart Catheterization with [name] on July 6th, 2018.  Please see attached instructions.   Follow up in 1 Month

## 2017-06-05 NOTE — Progress Notes (Signed)
Advanced Heart Failure Medication Review by a Pharmacist  Does the patient  feel that his/her medications are working for him/her?  yes  Has the patient been experiencing any side effects to the medications prescribed?  no  Does the patient measure his/her own blood pressure or blood glucose at home?  yes   Does the patient have any problems obtaining medications due to transportation or finances?   yes  Understanding of regimen: good Understanding of indications: good Potential of compliance: good Patient understands to avoid NSAIDs. Patient understands to avoid decongestants.  Issues to address at subsequent visits: none   Pharmacist comments: Mrs. Coggin is a pleasant 61 yo AA female presenting without medication bottles or list, however with good recollection of current regimen. Of note, patient states she is currently taking torsemide 40 mg daily. Patient states she was out of uptravi x1 week ~2 weeks ago, has since gotten a supply and has restarted without incident. Otherwise endorses good adherence to medications.   Patient asks about comparability of her Rx drugs with two OTC medications: Acai proprietary blend (contains licorice) and garcinia combogia. After a drug drug interaction check clinically significant interactions are listed below:  1.  Licorice-Aspirin: increased risk of bleeding 2. Licorice-potassium: decreased absorption of potassium  3. Licorice and torsemide: decreased efficacy of torsemide and increased risk of hypokalemia Communicated findings to [name] and advised to avoid acai blend.   No other medication-related questions or concerns at this time.   Patient was seen with Marlinda Mike, PharmD Candidate  Allena Katz, Pharm.D. PGY1 Pharmacy Resident 6/28/201812:26 PM Pager [phone]  Time with patient: 10 mins Preparation and documentation time: 10 mins Total time: 20 mins

## 2017-06-05 NOTE — Progress Notes (Signed)
Patient ID: Kelly Donaldson, female   DOB: 05/04/1956, 61 y.o.   MRN: [id number]   PCP: Kelly Donaldson Pulmonology: Dr Kelly Donaldson Primary HF: Dr Kenise Barraco   Ms Kelly Donaldson is a 61 year old with history of chronic hypoxemic respiratory failure and pulmonary hypertension.  She has been followed by Dr Kelly Donaldson, now follows with Dr. McQuaid with pulmonology.  Has been on home oxygen for 6 years.  She quit smoking in 2016.  She had an echo done in 5/16 showing dilated RV with severe pulmonary hypertension.  She therefore had RHC in 5/16 which confirmed severe pulmonary hypertension and RV failure.  PFTs showed only mild obstruction and restriction with markedly decreased DLCO suggestive of pulmonary vascular disease.  There was concern for sarcoidosis on CT chest but lymph node biopsy showed no evidence for sarcoidosis.  V/Q scan showed no evidence for chronic PE.   She has not tolerated Revatio or Adcirca.  Have failed previous up-titration of Selexipag with pelvic pain, can tolerate 200 mcg bid.  She is now tolerating riociguat 1 mg tid.   Started torsemide in Oct. 2017, has had better diuresis. At home 02 sats range in upper 80s/low 90s with 02 via [place] 3-5Lpm.   Today she returns for PAH follow up. She is doing pulmonary rehab.  Echo was done today.  RV mildly dilated with normal systolic function but PA systolic pressure estimate higher than on last year's echo.  She is taking all her PH meds currently.  She cut her torsemide back from 80 mg daily to 40 mg daily on her own.  Weight is up 1 lb.  Her breathing is about the same, short of breath walking moderate distances. No orthopnea/PND.  No chest pain.  No lightheadedness.   6 minute walk (5/16): 238 meters => decreased oxygen saturation to 73%, increased to 6 L [place] 6 minute walk (7/16): 170 meters => unable to complete.  6 minute walk (9/16): 201 meters 6 minute walk (1/17): 256 meters 6 minute walk (4/17): 219 meters 6 minute walk (8/17): 225 meters 6 minute walk (11/17):  244 meters 6 minute walk (5/18): 229 meters  Labs (2/16): BNP 69 Labs (5/16): LFTs normal, HCT 34.9, RF negative, HIV negative, TSH negative Labs (6/16): K 4.4, creatinine 0.88 Labs (7/16): ANA negative, BNP 58, K 4.3, creatinine 1.1, anti-RNP negative Labs (9/16): K 4.4, creatinine 1.26, BNP 91 Labs (10/16): K 4.3, creatinine 1.04 Labs (12/16): K 4.2, creatinine 1.07, BNP 130 Labs (1/17): K 4.3, creatinine 1.23, BNP 42 Labs (3/17): K 4.2, creatinine 1.19 Labs (5/17): K 4.3, creatinine 1.35, BNP 44 Labs (6/17): K 4.4, creatinine 1.18, BNP 44 Labs (10/17): K 4.2, creatinine 0.98, BNP 43 Labs (12/17): K 4.5, creatinine 1.23. BNP 64.5 Labs (2/18): K 4.5, creatinine 1.21, BNP 64 Labs (4/18): K 4.1, creatinine 1.24, BNP 26 . PMH: 1. Pulmonary hypertension: RHC (5/16) with mean RA 18, PA 83/33 mean 53, mean PCWP 19, CI 2.61, PVR 5.8 WU.   Echo (5/16) with EF 65-70%, septal flattening, dilated RV with PA systolic pressure 99 mmHg, +bubble study.  PFTs (4/16) with FVC 76%, FEV1 75%, ratio 97%, TLC 66%, DLCO 26% => mild restriction, mild obstruction, severe diffuse abnormality (pulmonary vascular problem).  CTA chest (4/16) with chronic interstitial and obstructive lung disease, small mediastinal and hilar lymph noes, no PE.  Lymph node biopsy negative for sarcoidosis (reactive changes).  V/Q scan (6/16): No acute or chronic PE. TEE (6/16): Normal LV size and systolic function, EF   55-60%,RV was mildly dilated with mildly decreased systolic function, D-shaped interventricular septum suggestive of RV pressure/volume overload, there appeared to be a small PFO present with some bubbles crossing, no ASD.  ANA negative, anti-RNP negative, HIV negative. Sleep study (8/16) without OSA.  She did not tolerate Adcirca or Revatio.  She cannot tolerate more than 200 mcg bid Selexipag.  - Echo (6/17): EF 60-65%, normal diastolic function, D-shaped interventricular septum, mildly dilated RV with normal RV systolic  function, PASP 39, IVC normal.  - Echo (6/18): EF 60-65%, normla diastolic function, D-shaped interventricular septum, mildly dilated RV with normal RV systolic function, PASP 65 mmHg.  2. Chronic venous insufficiency. 3. Hyperlipidemia.  4. GERD 5. Chronic hypoxemic respiratory failure: Hypoxemia noted x 6 years at least.  Seen by Dr Kelly Donaldson, has emphysema + interstitial lung disease (?UIP).   SH: Married, quit smoking in 3/16.  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    FH: No pulmonary hypertension, no premature CAD, no sudden death.   ROS: All systems reviewed and negative except as per HPI.    Current Outpatient Prescriptions  Medication Sig Dispense Refill  . aspirin EC 81 MG tablet Take 1 tablet (81 mg total) by mouth daily. 90 tablet 3  . cefdinir (OMNICEF) 300 MG capsule Take 1 capsule (300 mg total) by mouth 2 (two) times daily. 1 po BID 20 capsule 0  . cetirizine (ZYRTEC) 10 MG tablet Take 1 tablet (10 mg total) by mouth daily. 90 tablet 3  . citalopram (CELEXA) 40 MG tablet TAKE ONE TABLET BY MOUTH ONCE DAILY FOR 30 DAYS 30 tablet 2  . HYDROcodone-acetaminophen (NORCO) 10-325 MG tablet Take 1-2 tablets by mouth every 6 (six) hours as needed. 240 tablet 0  . HYDROcodone-acetaminophen (NORCO) 10-325 MG tablet Take 1-2 tablets by mouth every 6 (six) hours as needed. 240 tablet 0  . HYDROcodone-acetaminophen (NORCO) 10-325 MG tablet Take 1-2 tablets by mouth every 6 (six) hours as needed. 240 tablet 0  . HYDROcodone-homatropine (HYCODAN) 5-1.5 MG/5ML syrup Take 5-10 mLs by mouth every 6 (six) hours as needed. 240 mL 0  . Ipratropium-Albuterol (COMBIVENT RESPIMAT) 20-100 MCG/ACT AERS respimat Inhale 1 puff into the lungs every 6 (six) hours as needed for wheezing. 3 Inhaler 3  . ipratropium-albuterol (DUONEB) 0.5-2.5 (3) MG/3ML SOLN Take 3 mLs by nebulization 4 (four) times daily as needed. 360 mL 11  . Macitentan (OPSUMIT) 10 MG TABS Take 1 tablet (10 mg total) by mouth  daily. 30 tablet 11  . meloxicam (MOBIC) 15 MG tablet Take 1 tablet (15 mg total) by mouth daily. 30 tablet 11  . OXYGEN 2.5 lpm with sleep and 4 lpm with exertion  [employer]    . potassium chloride SA (K-DUR,KLOR-CON) 20 MEQ tablet Take 1 tablet (20 mEq total) by mouth 2 (two) times daily. 180 tablet 3  . pravastatin (PRAVACHOL) 20 MG tablet Take 1 tablet (20 mg total) by mouth daily. 90 tablet 3  . Riociguat (ADEMPAS) 2 MG TABS Take 2 mg by mouth 3 (three) times daily. 90 tablet 11  . Selexipag 200 MCG TABS Take 1 tablet (200 mcg total) by mouth 2 (two) times daily. 60 tablet 6  . torsemide (DEMADEX) 20 MG tablet Take 2 tablets (40 mg total) by mouth daily. 180 tablet 3  . Vitamin D, Ergocalciferol, (DRISDOL) 50000 units CAPS capsule TAKE ONE CAPSULE BY MOUTH ONCE WEEKLY FOR 30 DAYS 12 capsule 3  . zolpidem (AMBIEN) 5 MG tablet TAKE ONE TABLET  BY MOUTH AT BEDTIME 30 tablet 2   No current facility-administered medications for this encounter.    BP (!) 128/55   Pulse 68   Wt 255 lb 12.8 oz (116 kg)   SpO2 97% Comment: 4L O2  BMI 40.06 kg/m   Wt Readings from Last 3 Encounters:  06/05/17 255 lb 12.8 oz (116 kg)  05/28/17 255 lb 6.4 oz (115.8 kg)  04/17/17 259 lb 0.7 oz (117.5 kg)     General: NAD. Wearing oxygen.  Neck: Thick, JVP 12 cm.  No thyromegaly or thyroid nodule. No carotid bruit. Lungs: Clear, normal effort. Breathing swallow.  CV: Nondisplaced PMI.  Heart regular S1/S2, no S3, 2/6 HSM LLSB.  1+ ankle edema.     Abdomen: Soft, NT, ND, no HSM. No bruits or masses. +BS  Skin: Intact without lesions or rashes.  Neurologic: Alert and oriented x 3.  Psych: Normal Affect.  Extremities: No clubbing or cyanosis.No edema.  HEENT: Normal.  Assessment/Plan:  1. Pulmonary arterial hypertension with RV failure: Severe on prior RHC. 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  There was concern  from CTA chest for sarcoidosis, but lymph node biopsy was negative.  LFTs normal, ANA negative, anti-RNP negative, HIV negative.  V/Q scan negative for chronic PE.  Small PFO but no ASD on TEE.  No OSA on sleep study.  She did not tolerate Revatio due to joint pain (now resolved), and she did not tolerate Adcirca with worsening dyspnea.  She does not tolerate more than 200 mcg bid Selexipag due to pelvic pain.  Echo 6/17 showed mildly dilated RV with normal systolic function and PASP appeared lower at 39 mmHg. Echo in 6/18 showed mildly dilated RV with normal systolic function, but PASP appeared higher than prior at 65 mmHg. She has cut back her torsemide and is volume overloaded today.  - Open lung biopsy deemed too risky, probably not sarcoid.   - Increase torsemide to 40 mg bid.  BMET/BNP today and repeat in 10 days.  - Continue opsumit, selexipag, and riociguat 2 mg twice a day (unable to go higher due to dizziness).  Intolerant to up-titration of selexipag due to joint pain.  - I will arrange for echo with followup in 6/18.  - Defer 6 minute walk today because she is wearing flip flops. - Given increased volume overload and elevated PA pressure by echo today, I think she would benefit from repeat RHC. We discussed the risks and benefits of this today and she agrees to the procedure.   2. Chronic hypoxemic respiratory failure: Chronic interstitial and obstructive lung disease, mediastinal and hilar lymphadenopathy.  Concern for sarcoidosis.  PFTs showed only mild restriction and obstruction.  Biopsy did not show sarcoid.  Deemed too high risk for open lung biopsy.  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 - O2 sats stable. Continue current regimen.   - Sees pulmonary, Dr Kelly Donaldson.  Followup 1 month.  [name] 06/05/2017

## 2017-06-05 NOTE — Progress Notes (Signed)
  Echocardiogram 2D Echocardiogram has been performed.  Delcie Roch 06/05/2017, 11:53 AM

## 2017-06-06 ENCOUNTER — Ambulatory Visit: Payer: Medicare PPO | Admitting: Physician Assistant

## 2017-06-06 DIAGNOSIS — H10023 Other mucopurulent conjunctivitis, bilateral: Secondary | ICD-10-CM | POA: Diagnosis not present

## 2017-06-06 DIAGNOSIS — H5034 Intermittent alternating exotropia: Secondary | ICD-10-CM | POA: Diagnosis not present

## 2017-06-09 ENCOUNTER — Telehealth (HOSPITAL_COMMUNITY): Payer: Self-pay | Admitting: *Deleted

## 2017-06-09 NOTE — Telephone Encounter (Signed)
RHC Auth #[id number]

## 2017-06-10 ENCOUNTER — Encounter (HOSPITAL_COMMUNITY)
Admission: RE | Admit: 2017-06-10 | Discharge: 2017-06-10 | Disposition: A | Discharge status: Home / Self Care | Payer: Medicare PPO | Source: Ambulatory Visit | Attending: Cardiology | Admitting: Cardiology

## 2017-06-10 DIAGNOSIS — I2721 Secondary pulmonary arterial hypertension: Secondary | ICD-10-CM

## 2017-06-10 NOTE — Progress Notes (Signed)
Daily Session Note  Patient Details  Name: Kelly Donaldson MRN: [id number] Date of Birth: Apr 17, 1956 Referring Provider:     PULMONARY REHAB OTHER RESP ORIENTATION from 04/17/2017 in [place]  Referring Provider  Dr. [name]      Encounter Date: 06/10/2017  Check In:     Session Check In - 06/10/17 1341      Check-In   Location [facility]   Staff Present [name], MS, EP, [facility], Exercise Physiologist;Sheddrick Lattanzio Luther Parody, BS, EP, Exercise Physiologist   Supervising physician immediately available to respond to emergencies See telemetry face sheet for immediately available MD   Medication changes reported     No   Fall or balance concerns reported    No   Warm-up and Cool-down Performed as group-led instruction   Resistance Training Performed Yes   VAD Patient? No     Pain Assessment   Currently in Pain? No/denies   Pain Score 0-No pain   Multiple Pain Sites No      Capillary Blood Glucose: No results found for this or any previous visit (from the past 24 hour(s)).    History  Smoking Status  . Former Smoker  . Packs/day: 0.25  . Years: 17.00  . Types: Cigarettes  . Quit date: 03/02/2015  Smokeless Tobacco  . Never Used    Goals Met:  Independence with exercise equipment Improved SOB with ADL's Using PLB without cueing & demonstrates good technique Exercise tolerated well No report of cardiac concerns or symptoms Strength training completed today  Goals Unmet:  Not Applicable  Comments: Check out 230   Dr. Sinda Du is Medical Director for [facility].

## 2017-06-12 ENCOUNTER — Encounter (HOSPITAL_COMMUNITY)
Admission: RE | Admit: 2017-06-12 | Discharge: 2017-06-12 | Disposition: A | Discharge status: Home / Self Care | Payer: Medicare PPO | Source: Ambulatory Visit | Attending: Cardiology | Admitting: Cardiology

## 2017-06-12 DIAGNOSIS — I2721 Secondary pulmonary arterial hypertension: Secondary | ICD-10-CM | POA: Diagnosis not present

## 2017-06-12 NOTE — Progress Notes (Signed)
Daily Session Note  Patient Details  Name: Kelly Donaldson MRN: [id number] Date of Birth: 09-Jan-1956 Referring Provider:     PULMONARY REHAB OTHER RESP ORIENTATION from 04/17/2017 in [place]  Referring Provider  Dr. [name]      Encounter Date: 06/12/2017  Check In:     Session Check In - 06/12/17 1319      Check-In   Location [facility]   Staff Present [name], MS, EP, [facility], Exercise Physiologist;Vaeda Westall Luther Parody, BS, EP, Exercise Physiologist   Supervising physician immediately available to respond to emergencies See telemetry face sheet for immediately available MD   Medication changes reported     No   Fall or balance concerns reported    No   Warm-up and Cool-down Performed as group-led instruction   Resistance Training Performed Yes   VAD Patient? No     Pain Assessment   Currently in Pain? No/denies   Pain Score 0-No pain   Multiple Pain Sites No      Capillary Blood Glucose: No results found for this or any previous visit (from the past 24 hour(s)).      Exercise Prescription Changes - 06/12/17 0700      Response to Exercise   Blood Pressure (Admit) 106/56   Blood Pressure (Exercise) 124/66   Blood Pressure (Exit) 104/60   Heart Rate (Admit) 65 bpm   Heart Rate (Exercise) 111 bpm   Heart Rate (Exit) 77 bpm   Oxygen Saturation (Admit) 93 %   Oxygen Saturation (Exercise) 90 %   Oxygen Saturation (Exit) 94 %   Rating of Perceived Exertion (Exercise) 13   Perceived Dyspnea (Exercise) 12   Duration Progress to 30 minutes of  aerobic without signs/symptoms of physical distress   Intensity THRR unchanged     Progression   Progression Continue to progress workloads to maintain intensity without signs/symptoms of physical distress.     Resistance Training   Training Prescription Yes   Weight 0   Reps 10-15     Oxygen   Oxygen Continuous   Liters 6     Treadmill   MPH 1.6   Grade 0   Minutes 15   METs 2.2     NuStep   Level 3   SPM 20   Minutes 20   METs 2.8     Home Exercise Plan   Plans to continue exercise at Home (comment)   Frequency Add 2 additional days to program exercise sessions.      History  Smoking Status  . Former Smoker  . Packs/day: 0.25  . Years: 17.00  . Types: Cigarettes  . Quit date: 03/02/2015  Smokeless Tobacco  . Never Used    Goals Met:  Independence with exercise equipment Improved SOB with ADL's Using PLB without cueing & demonstrates good technique Exercise tolerated well No report of cardiac concerns or symptoms Strength training completed today  Goals Unmet:  Not Applicable  Comments: Check out 230   Dr. Sinda Du is Medical Director for [facility].

## 2017-06-13 ENCOUNTER — Encounter (HOSPITAL_COMMUNITY): Payer: Self-pay | Admitting: Cardiology

## 2017-06-13 ENCOUNTER — Ambulatory Visit (HOSPITAL_COMMUNITY)
Admission: RE | Admit: 2017-06-13 | Discharge: 2017-06-13 | Disposition: A | Discharge status: Home / Self Care | Payer: Medicare PPO | Source: Ambulatory Visit | Attending: Cardiology | Admitting: Cardiology

## 2017-06-13 ENCOUNTER — Encounter (HOSPITAL_COMMUNITY)
Admission: RE | Disposition: A | Discharge status: Home / Self Care | Payer: Self-pay | Source: Ambulatory Visit | Attending: Cardiology

## 2017-06-13 DIAGNOSIS — E785 Hyperlipidemia, unspecified: Secondary | ICD-10-CM | POA: Insufficient documentation

## 2017-06-13 DIAGNOSIS — Z7982 Long term (current) use of aspirin: Secondary | ICD-10-CM | POA: Insufficient documentation

## 2017-06-13 DIAGNOSIS — Z9981 Dependence on supplemental oxygen: Secondary | ICD-10-CM | POA: Diagnosis not present

## 2017-06-13 DIAGNOSIS — I2721 Secondary pulmonary arterial hypertension: Secondary | ICD-10-CM | POA: Insufficient documentation

## 2017-06-13 DIAGNOSIS — R590 Localized enlarged lymph nodes: Secondary | ICD-10-CM | POA: Diagnosis not present

## 2017-06-13 DIAGNOSIS — Z87891 Personal history of nicotine dependence: Secondary | ICD-10-CM | POA: Insufficient documentation

## 2017-06-13 DIAGNOSIS — I872 Venous insufficiency (chronic) (peripheral): Secondary | ICD-10-CM | POA: Diagnosis not present

## 2017-06-13 DIAGNOSIS — I270 Primary pulmonary hypertension: Secondary | ICD-10-CM

## 2017-06-13 DIAGNOSIS — J449 Chronic obstructive pulmonary disease, unspecified: Secondary | ICD-10-CM | POA: Diagnosis not present

## 2017-06-13 DIAGNOSIS — I272 Pulmonary hypertension, unspecified: Secondary | ICD-10-CM | POA: Diagnosis not present

## 2017-06-13 DIAGNOSIS — J9611 Chronic respiratory failure with hypoxia: Secondary | ICD-10-CM | POA: Diagnosis not present

## 2017-06-13 DIAGNOSIS — E8770 Fluid overload, unspecified: Secondary | ICD-10-CM | POA: Diagnosis not present

## 2017-06-13 HISTORY — PX: RIGHT HEART CATH: CATH118263

## 2017-06-13 LAB — BASIC METABOLIC PANEL
Anion gap: 9 (ref 5–15)
BUN: 27 mg/dL — AB (ref 6–20)
CALCIUM: 9 mg/dL (ref 8.9–10.3)
CO2: 27 mmol/L (ref 22–32)
CREATININE: 1.36 mg/dL — AB (ref 0.44–1.00)
Chloride: 100 mmol/L — ABNORMAL LOW (ref 101–111)
GFR, EST AFRICAN AMERICAN: 48 mL/min — AB (ref >60)
GFR, EST NON AFRICAN AMERICAN: 41 mL/min — AB (ref >60)
Glucose, Bld: 96 mg/dL (ref 65–99)
Potassium: 4.4 mmol/L (ref 3.5–5.1)
SODIUM: 136 mmol/L (ref 135–145)

## 2017-06-13 LAB — POCT I-STAT 3, VENOUS BLOOD GAS (G3P V)
Acid-Base Excess: 3 mmol/L — ABNORMAL HIGH (ref 0.0–2.0)
Acid-Base Excess: 4 mmol/L — ABNORMAL HIGH (ref 0.0–2.0)
BICARBONATE: 27.9 mmol/L (ref 20.0–28.0)
Bicarbonate: 28.6 mmol/L — ABNORMAL HIGH (ref 20.0–28.0)
O2 Saturation: 73 %
O2 Saturation: 75 %
PCO2 VEN: 43.4 mmHg — AB (ref 44.0–60.0)
PH VEN: 7.428 (ref 7.250–7.430)
PH VEN: 7.428 (ref 7.250–7.430)
TCO2: 30 mmol/L (ref 0–100)
TCO2: 29 mmol/L (ref 0–100)
pCO2, Ven: 42.3 mmHg — ABNORMAL LOW (ref 44.0–60.0)
pO2, Ven: 38 mmHg (ref 32.0–45.0)
pO2, Ven: 39 mmHg (ref 32.0–45.0)

## 2017-06-13 LAB — PROTIME-INR
INR: 0.96
PROTHROMBIN TIME: 12.8 s (ref 11.4–15.2)

## 2017-06-13 LAB — CBC
HCT: 29.9 % — ABNORMAL LOW (ref 36.0–46.0)
Hemoglobin: 8.6 g/dL — ABNORMAL LOW (ref 12.0–15.0)
MCH: 21.7 pg — ABNORMAL LOW (ref 26.0–34.0)
MCHC: 28.8 g/dL — ABNORMAL LOW (ref 30.0–36.0)
MCV: 75.5 fL — AB (ref 78.0–100.0)
PLATELETS: 211 10*3/uL (ref 150–400)
RBC: 3.96 MIL/uL (ref 3.87–5.11)
RDW: 16.7 % — AB (ref 11.5–15.5)
WBC: 8.5 10*3/uL (ref 4.0–10.5)

## 2017-06-13 SURGERY — RIGHT HEART CATH
Anesthesia: LOCAL

## 2017-06-13 MED ORDER — LIDOCAINE HCL (PF) 1 % IJ SOLN
INTRAMUSCULAR | Status: AC
  Filled 2017-06-13: qty 30

## 2017-06-13 MED ORDER — ONDANSETRON HCL 4 MG/2ML IJ SOLN: 4.0000 mg | Freq: Four times a day (QID) | INTRAMUSCULAR | Status: DC | PRN

## 2017-06-13 MED ORDER — LIDOCAINE HCL (PF) 1 % IJ SOLN
INTRAMUSCULAR | Status: DC | PRN
  Administered 2017-06-13: 2 mL

## 2017-06-13 MED ORDER — HEPARIN (PORCINE) IN NACL 2-0.9 UNIT/ML-% IJ SOLN
INTRAMUSCULAR | Status: AC | PRN
  Administered 2017-06-13: 500 mL

## 2017-06-13 MED ORDER — SODIUM CHLORIDE 0.9% FLUSH: 3.0000 mL | INTRAVENOUS | Status: DC | PRN

## 2017-06-13 MED ORDER — SODIUM CHLORIDE 0.9 % IV SOLN: 250.0000 mL | INTRAVENOUS | Status: DC | PRN

## 2017-06-13 MED ORDER — HEPARIN (PORCINE) IN NACL 2-0.9 UNIT/ML-% IJ SOLN
INTRAMUSCULAR | Status: AC
  Filled 2017-06-13: qty 500

## 2017-06-13 MED ORDER — SODIUM CHLORIDE 0.9 % IV SOLN
INTRAVENOUS | Status: DC
  Administered 2017-06-13: 12:00:00 via INTRAVENOUS

## 2017-06-13 MED ORDER — ACETAMINOPHEN 325 MG PO TABS: 650.0000 mg | ORAL_TABLET | ORAL | Status: DC | PRN

## 2017-06-13 MED ORDER — ASPIRIN 81 MG PO CHEW: 81.0000 mg | CHEWABLE_TABLET | ORAL | Status: DC

## 2017-06-13 MED ORDER — SODIUM CHLORIDE 0.9% FLUSH: 3.0000 mL | Freq: Two times a day (BID) | INTRAVENOUS | Status: DC

## 2017-06-13 SURGICAL SUPPLY — 8 items
CATH BALLN WEDGE 5F 110CM (CATHETERS) ×2 IMPLANT
PACK CARDIAC CATHETERIZATION (CUSTOM PROCEDURE TRAY) ×2 IMPLANT
PROTECTION STATION PRESSURIZED (MISCELLANEOUS) ×2
SHEATH GLIDE SLENDER 4/5FR (SHEATH) ×2 IMPLANT
STATION PROTECTION PRESSURIZED (MISCELLANEOUS) ×1 IMPLANT
TRANSDUCER W/STOPCOCK (MISCELLANEOUS) ×2 IMPLANT
TUBING ART PRESS 72  MALE/FEM (TUBING) ×1
TUBING ART PRESS 72 MALE/FEM (TUBING) ×1 IMPLANT

## 2017-06-13 NOTE — Interval H&P Note (Signed)
History and Physical Interval Note:  06/13/2017 1:48 PM  Kelly Donaldson  has presented today for surgery, with the diagnosis of hp  The various methods of treatment have been discussed with the patient and family. After consideration of risks, benefits and other options for treatment, the patient has consented to  Procedure(s): Right Heart Cath (N/A) as a surgical intervention .  The patient's history has been reviewed, patient examined, no change in status, stable for surgery.  I have reviewed the patient's chart and labs.  Questions were answered to the patient's satisfaction.     Zarahi Fuerst [employer]

## 2017-06-13 NOTE — Discharge Instructions (Signed)
Right Heart Cath, Care After This sheet gives you information about how to care for yourself after your procedure. Your health care provider may also give you more specific instructions. If you have problems or questions, contact your health care provider. What can I expect after the procedure? After the procedure, it is common to have:  Bruising or mild discomfort in the area where the IV was inserted (insertion site).  Follow these instructions at home: Eating and drinking  Follow instructions from your health care provider about eating or drinking restrictions.  Drink a lot of fluids for the first several days after the procedure, as directed by your health care provider. This helps to wash (flush) the contrast out of your body. Examples of healthy fluids include water or low-calorie drinks. General instructions  Check your IV insertion area every day for signs of infection. Check for: ? Redness, swelling, or pain. ? Fluid or blood. ? Warmth. ? Pus or a bad smell.  Take over-the-counter and prescription medicines only as told by your health care provider.  Rest and return to your normal activities as told by your health care provider. Ask your health care provider what activities are safe for you.  Do not drive for 24 hours if you were given a medicine to help you relax (sedative), or until your health care provider approves.  Keep all follow-up visits as told by your health care provider. This is important. Contact a health care provider if:  Your skin becomes itchy or you develop a rash or hives.  You have a fever that does not get better with medicine.  You feel nauseous.  You vomit.  You have redness, swelling, or pain around the insertion site.  You have fluid or blood coming from the insertion site.  Your insertion area feels warm to the touch.  You have pus or a bad smell coming from the insertion site. Get help right away if:  You have difficulty breathing or  shortness of breath.  You develop chest pain.  You faint.  You feel very dizzy. These symptoms may represent a serious problem that is an emergency. Do not wait to see if the symptoms will go away. Get medical help right away. Call your local emergency services (911 in the [place] Do not drive yourself to the hospital. Summary  After your procedure, it is common to have bruising or mild discomfort in the area where the IV was inserted.  You should check your IV insertion area every day for signs of infection.  Take over-the-counter and prescription medicines only as told by your health care provider.  You should drink a lot of fluids for the first several days after the procedure to help flush the contrast from your body. This information is not intended to replace advice given to you by your health care provider. Make sure you discuss any questions you have with your health care provider. Document Released: 09/15/2013 Document Revised: 10/19/2016 Document Reviewed: 10/19/2016 Elsevier Interactive Patient Education  2017 [employer].

## 2017-06-13 NOTE — H&P (View-Only) (Signed)
Patient ID: Kelly Donaldson, female   DOB: 1955/12/14, 61 y.o.   MRN: [id number]   PCP: Kelly Donaldson Pulmonology: Dr Kelly Donaldson Primary HF: Dr Kelly Donaldson   Kelly Donaldson is a 61 year old with history of chronic hypoxemic respiratory failure and pulmonary hypertension.  She has been followed by Dr Kelly Donaldson, now follows with Dr. [name] with pulmonology.  Has been on home oxygen for 6 years.  She quit smoking in 2016.  She had an echo done in 5/16 showing dilated RV with severe pulmonary hypertension.  She therefore had RHC in 5/16 which confirmed severe pulmonary hypertension and RV failure.  PFTs showed only mild obstruction and restriction with markedly decreased DLCO suggestive of pulmonary vascular disease.  There was concern for sarcoidosis on CT chest but lymph node biopsy showed no evidence for sarcoidosis.  V/Q scan showed no evidence for chronic PE.   She has not tolerated Revatio or Adcirca.  Have failed previous up-titration of Selexipag with pelvic pain, can tolerate 200 mcg bid.  She is now tolerating riociguat 1 mg tid.   Started torsemide in Oct. 2017, has had better diuresis. At home 02 sats range in upper 80s/low 90s with 02 via [place] 3-5Lpm.   Today she returns for [facility] follow up. She is doing pulmonary rehab.  Echo was done today.  RV mildly dilated with normal systolic function but PA systolic pressure estimate higher than on last year's echo.  She is taking all her PH meds currently.  She cut her torsemide back from 80 mg daily to 40 mg daily on her own.  Weight is up 1 lb.  Her breathing is about the same, short of breath walking moderate distances. No orthopnea/PND.  No chest pain.  No lightheadedness.   6 minute walk (5/16): 238 meters => decreased oxygen saturation to 73%, increased to 6 L [place] 6 minute walk (7/16): 170 meters => unable to complete.  6 minute walk (9/16): 201 meters 6 minute walk (1/17): 256 meters 6 minute walk (4/17): 219 meters 6 minute walk (8/17): 225 meters 6 minute walk (11/17):  244 meters 6 minute walk (5/18): 229 meters  Labs (2/16): BNP 69 Labs (5/16): LFTs normal, HCT 34.9, RF negative, HIV negative, TSH negative Labs (6/16): K 4.4, creatinine 0.88 Labs (7/16): ANA negative, BNP 58, K 4.3, creatinine 1.1, anti-RNP negative Labs (9/16): K 4.4, creatinine 1.26, BNP 91 Labs (10/16): K 4.3, creatinine 1.04 Labs (12/16): K 4.2, creatinine 1.07, BNP 130 Labs (1/17): K 4.3, creatinine 1.23, BNP 42 Labs (3/17): K 4.2, creatinine 1.19 Labs (5/17): K 4.3, creatinine 1.35, BNP 44 Labs (6/17): K 4.4, creatinine 1.18, BNP 44 Labs (10/17): K 4.2, creatinine 0.98, BNP 43 Labs (12/17): K 4.5, creatinine 1.23. BNP 64.5 Labs (2/18): K 4.5, creatinine 1.21, BNP 64 Labs (4/18): K 4.1, creatinine 1.24, BNP 26 . PMH: 1. Pulmonary hypertension: RHC (5/16) with mean RA 18, PA 83/33 mean 53, mean PCWP 19, CI 2.61, PVR 5.8 WU.   Echo (5/16) with EF 65-70%, septal flattening, dilated RV with PA systolic pressure 99 mmHg, +bubble study.  PFTs (4/16) with FVC 76%, FEV1 75%, ratio 97%, TLC 66%, DLCO 26% => mild restriction, mild obstruction, severe diffuse abnormality (pulmonary vascular problem).  CTA chest (4/16) with chronic interstitial and obstructive lung disease, small mediastinal and hilar lymph noes, no PE.  Lymph node biopsy negative for sarcoidosis (reactive changes).  V/Q scan (6/16): No acute or chronic PE. TEE (6/16): Normal LV size and systolic function, EF  55-60%,RV was mildly dilated with mildly decreased systolic function, D-shaped interventricular septum suggestive of RV pressure/volume overload, there appeared to be a small PFO present with some bubbles crossing, no ASD.  ANA negative, anti-RNP negative, HIV negative. Sleep study (8/16) without OSA.  She did not tolerate Adcirca or Revatio.  She cannot tolerate more than 200 mcg bid Selexipag.  - Echo (6/17): EF 60-65%, normal diastolic function, D-shaped interventricular septum, mildly dilated RV with normal RV systolic  function, PASP 39, IVC normal.  - Echo (6/18): EF 60-65%, normla diastolic function, D-shaped interventricular septum, mildly dilated RV with normal RV systolic function, PASP 65 mmHg.  2. Chronic venous insufficiency. 3. Hyperlipidemia.  4. GERD 5. Chronic hypoxemic respiratory failure: Hypoxemia noted x 6 years at least.  Seen by Dr Kelly Donaldson, has emphysema + interstitial lung disease (?UIP).   SH: Married, quit smoking in 3/16.  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    FH: No pulmonary hypertension, no premature CAD, no sudden death.   ROS: All systems reviewed and negative except as per HPI.    Current Outpatient Prescriptions  Medication Sig Dispense Refill  . aspirin EC 81 MG tablet Take 1 tablet (81 mg total) by mouth daily. 90 tablet 3  . cefdinir (OMNICEF) 300 MG capsule Take 1 capsule (300 mg total) by mouth 2 (two) times daily. 1 po BID 20 capsule 0  . cetirizine (ZYRTEC) 10 MG tablet Take 1 tablet (10 mg total) by mouth daily. 90 tablet 3  . citalopram (CELEXA) 40 MG tablet TAKE ONE TABLET BY MOUTH ONCE DAILY FOR 30 DAYS 30 tablet 2  . HYDROcodone-acetaminophen (NORCO) 10-325 MG tablet Take 1-2 tablets by mouth every 6 (six) hours as needed. 240 tablet 0  . HYDROcodone-acetaminophen (NORCO) 10-325 MG tablet Take 1-2 tablets by mouth every 6 (six) hours as needed. 240 tablet 0  . HYDROcodone-acetaminophen (NORCO) 10-325 MG tablet Take 1-2 tablets by mouth every 6 (six) hours as needed. 240 tablet 0  . HYDROcodone-homatropine (HYCODAN) 5-1.5 MG/5ML syrup Take 5-10 mLs by mouth every 6 (six) hours as needed. 240 mL 0  . Ipratropium-Albuterol (COMBIVENT RESPIMAT) 20-100 MCG/ACT AERS respimat Inhale 1 puff into the lungs every 6 (six) hours as needed for wheezing. 3 Inhaler 3  . ipratropium-albuterol (DUONEB) 0.5-2.5 (3) MG/3ML SOLN Take 3 mLs by nebulization 4 (four) times daily as needed. 360 mL 11  . Macitentan (OPSUMIT) 10 MG TABS Take 1 tablet (10 mg total) by mouth  daily. 30 tablet 11  . meloxicam (MOBIC) 15 MG tablet Take 1 tablet (15 mg total) by mouth daily. 30 tablet 11  . OXYGEN 2.5 lpm with sleep and 4 lpm with exertion  [employer]    . potassium chloride SA (K-DUR,KLOR-CON) 20 MEQ tablet Take 1 tablet (20 mEq total) by mouth 2 (two) times daily. 180 tablet 3  . pravastatin (PRAVACHOL) 20 MG tablet Take 1 tablet (20 mg total) by mouth daily. 90 tablet 3  . Riociguat (ADEMPAS) 2 MG TABS Take 2 mg by mouth 3 (three) times daily. 90 tablet 11  . Selexipag 200 MCG TABS Take 1 tablet (200 mcg total) by mouth 2 (two) times daily. 60 tablet 6  . torsemide (DEMADEX) 20 MG tablet Take 2 tablets (40 mg total) by mouth daily. 180 tablet 3  . Vitamin D, Ergocalciferol, (DRISDOL) 50000 units CAPS capsule TAKE ONE CAPSULE BY MOUTH ONCE WEEKLY FOR 30 DAYS 12 capsule 3  . zolpidem (AMBIEN) 5 MG tablet TAKE ONE TABLET  BY MOUTH AT BEDTIME 30 tablet 2   No current facility-administered medications for this encounter.    BP (!) 128/55   Pulse 68   Wt 255 lb 12.8 oz (116 kg)   SpO2 97% Comment: 4L O2  BMI 40.06 kg/m   Wt Readings from Last 3 Encounters:  06/05/17 255 lb 12.8 oz (116 kg)  05/28/17 255 lb 6.4 oz (115.8 kg)  04/17/17 259 lb 0.7 oz (117.5 kg)     General: NAD. Wearing oxygen.  Neck: Thick, JVP 12 cm.  No thyromegaly or thyroid nodule. No carotid bruit. Lungs: Clear, normal effort. Breathing swallow.  CV: Nondisplaced PMI.  Heart regular S1/S2, no S3, 2/6 HSM LLSB.  1+ ankle edema.     Abdomen: Soft, NT, ND, no HSM. No bruits or masses. +BS  Skin: Intact without lesions or rashes.  Neurologic: Alert and oriented x 3.  Psych: Normal Affect.  Extremities: No clubbing or cyanosis.No edema.  HEENT: Normal.  Assessment/Plan:  1. Pulmonary arterial hypertension with RV failure: Severe on prior RHC. 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  There was concern  from CTA chest for sarcoidosis, but lymph node biopsy was negative.  LFTs normal, ANA negative, anti-RNP negative, HIV negative.  V/Q scan negative for chronic PE.  Small PFO but no ASD on TEE.  No OSA on sleep study.  She did not tolerate Revatio due to joint pain (now resolved), and she did not tolerate Adcirca with worsening dyspnea.  She does not tolerate more than 200 mcg bid Selexipag due to pelvic pain.  Echo 6/17 showed mildly dilated RV with normal systolic function and PASP appeared lower at 39 mmHg. Echo in 6/18 showed mildly dilated RV with normal systolic function, but PASP appeared higher than prior at 65 mmHg. She has cut back her torsemide and is volume overloaded today.  - Open lung biopsy deemed too risky, probably not sarcoid.   - Increase torsemide to 40 mg bid.  BMET/BNP today and repeat in 10 days.  - Continue opsumit, selexipag, and riociguat 2 mg twice a day (unable to go higher due to dizziness).  Intolerant to up-titration of selexipag due to joint pain.  - I will arrange for echo with followup in 6/18.  - Defer 6 minute walk today because she is wearing flip flops. - Given increased volume overload and elevated PA pressure by echo today, I think she would benefit from repeat RHC. We discussed the risks and benefits of this today and she agrees to the procedure.   2. Chronic hypoxemic respiratory failure: Chronic interstitial and obstructive lung disease, mediastinal and hilar lymphadenopathy.  Concern for sarcoidosis.  PFTs showed only mild restriction and obstruction.  Biopsy did not show sarcoid.  Deemed too high risk for open lung biopsy.  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 - O2 sats stable. Continue current regimen.   - Sees pulmonary, Dr Kelly Donaldson.  Followup 1 month.  [name] 06/05/2017

## 2017-06-17 ENCOUNTER — Encounter (HOSPITAL_COMMUNITY)
Admission: RE | Admit: 2017-06-17 | Discharge: 2017-06-17 | Disposition: A | Discharge status: Home / Self Care | Payer: Medicare PPO | Source: Ambulatory Visit | Attending: Cardiology | Admitting: Cardiology

## 2017-06-17 DIAGNOSIS — I2721 Secondary pulmonary arterial hypertension: Secondary | ICD-10-CM | POA: Diagnosis not present

## 2017-06-17 NOTE — Progress Notes (Signed)
Daily Session Note  Patient Details  Name: Kelly Donaldson MRN: [id number] Date of Birth: 02/07/1956 Referring Provider:     PULMONARY REHAB OTHER RESP ORIENTATION from 04/17/2017 in [place] [facility]  Referring Provider  Dr. Mclean      Encounter Date: 06/17/2017  Check In:     Session Check In - 06/17/17 1326      Check-In   Location [facility]   Staff Present Diane Coad, MS, EP, [facility], Exercise Physiologist;Wadsworth Skolnick, BS, EP, Exercise Physiologist   Supervising physician immediately available to respond to emergencies See telemetry face sheet for immediately available MD   Medication changes reported     No   Fall or balance concerns reported    No   Warm-up and Cool-down Performed as group-led instruction   Resistance Training Performed Yes   VAD Patient? No     Pain Assessment   Currently in Pain? No/denies   Pain Score 0-No pain   Multiple Pain Sites No      Capillary Blood Glucose: No results found for this or any previous visit (from the past 24 hour(s)).    History  Smoking Status  . Former Smoker  . Packs/day: 0.25  . Years: 17.00  . Types: Cigarettes  . Quit date: 03/02/2015  Smokeless Tobacco  . Never Used    Goals Met:  Independence with exercise equipment Improved SOB with ADL's Using PLB without cueing & demonstrates good technique Exercise tolerated well No report of cardiac concerns or symptoms Strength training completed today  Goals Unmet:  Not Applicable  Comments: Check out 230   Dr. Edward Hawkins is Medical Director for  Pulmonary Rehab. 

## 2017-06-19 ENCOUNTER — Encounter (HOSPITAL_COMMUNITY)
Admission: RE | Admit: 2017-06-19 | Discharge: 2017-06-19 | Disposition: A | Discharge status: Home / Self Care | Payer: Medicare PPO | Source: Ambulatory Visit | Attending: Cardiology | Admitting: Cardiology

## 2017-06-19 DIAGNOSIS — I2721 Secondary pulmonary arterial hypertension: Secondary | ICD-10-CM | POA: Diagnosis not present

## 2017-06-19 NOTE — Progress Notes (Signed)
Daily Session Note  Patient Details  Name: Kelly Donaldson MRN: [id number] Date of Birth: [birth year] Referring Provider:     PULMONARY REHAB OTHER RESP ORIENTATION from 04/17/2017 in [place]  Referring Provider  Dr. [name]      Encounter Date: 06/19/2017  Check In:     Session Check In - 06/19/17 1332      Check-In   Location [facility]   Staff Present [name], MS, EP, [facility], Exercise Physiologist;Dontrel Smethers Luther Parody, BS, EP, Exercise Physiologist   Supervising physician immediately available to respond to emergencies See telemetry face sheet for immediately available MD   Medication changes reported     No   Fall or balance concerns reported    No   Warm-up and Cool-down Performed as group-led instruction   Resistance Training Performed Yes   VAD Patient? No     Pain Assessment   Currently in Pain? No/denies   Pain Score 0-No pain   Multiple Pain Sites No      Capillary Blood Glucose: No results found for this or any previous visit (from the past 24 hour(s)).    History  Smoking Status  . Former Smoker  . Packs/day: 0.25  . Years: 17.00  . Types: Cigarettes  . Quit date: 03/02/2015  Smokeless Tobacco  . Never Used    Goals Met:  Independence with exercise equipment Improved SOB with ADL's Using PLB without cueing & demonstrates good technique Exercise tolerated well No report of cardiac concerns or symptoms Strength training completed today  Goals Unmet:  Not Applicable  Comments: Check out 230   Dr. Sinda Du is Medical Director for [facility].

## 2017-06-19 NOTE — Progress Notes (Signed)
Pulmonary Individual Treatment Plan  Patient Details  Name: Kelly Donaldson MRN: [id number] Date of Birth: 24-Sep-1956 Referring Provider:     PULMONARY REHAB OTHER RESP ORIENTATION from 04/17/2017 in [place]  Referring Provider  Dr. [name]      Initial Encounter Date:    [facility] from 04/17/2017 in [place]  Date  04/17/17  Referring Provider  Dr. [name]      Visit Diagnosis: PAH (pulmonary artery hypertension) ([place])  Patient's Home Medications on Admission:   Current Outpatient Prescriptions:  .  aspirin EC 81 MG tablet, Take 1 tablet (81 mg total) by mouth daily., Disp: 90 tablet, Rfl: 3 .  cefdinir (OMNICEF) 300 MG capsule, Take 1 capsule (300 mg total) by mouth 2 (two) times daily. 1 po BID (Patient not taking: Reported on 06/09/2017), Disp: 20 capsule, Rfl: 0 .  cetirizine (ZYRTEC) 10 MG tablet, Take 1 tablet (10 mg total) by mouth daily. (Patient taking differently: Take 10 mg by mouth at bedtime. ), Disp: 90 tablet, Rfl: 3 .  citalopram (CELEXA) 40 MG tablet, TAKE ONE TABLET BY MOUTH ONCE DAILY FOR 30 DAYS, Disp: 30 tablet, Rfl: 2 .  HYDROcodone-acetaminophen (NORCO) 10-325 MG tablet, Take 1-2 tablets by mouth every 6 (six) hours as needed., Disp: 240 tablet, Rfl: 0 .  HYDROcodone-homatropine (HYCODAN) 5-1.5 MG/5ML syrup, Take 5-10 mLs by mouth every 6 (six) hours as needed. (Patient not taking: Reported on 06/09/2017), Disp: 240 mL, Rfl: 0 .  Ipratropium-Albuterol (COMBIVENT RESPIMAT) 20-100 MCG/ACT AERS respimat, Inhale 1 puff into the lungs every 6 (six) hours as needed for wheezing., Disp: 3 Inhaler, Rfl: 3 .  ipratropium-albuterol (DUONEB) 0.5-2.5 (3) MG/3ML SOLN, Take 3 mLs by nebulization 4 (four) times daily as needed. (Patient taking differently: Take 3 mLs by nebulization 4 (four) times daily as needed (for wheezing/shortness of breath). ), Disp: 360 mL, Rfl: 11 .  Macitentan (OPSUMIT) 10 MG TABS,  Take 1 tablet (10 mg total) by mouth daily., Disp: 30 tablet, Rfl: 11 .  meloxicam (MOBIC) 15 MG tablet, Take 1 tablet (15 mg total) by mouth daily. (Patient not taking: Reported on 06/09/2017), Disp: 30 tablet, Rfl: 11 .  meloxicam (MOBIC) 7.5 MG tablet, Take 7.5 mg by mouth at bedtime., Disp: , Rfl:  .  OXYGEN, 2.5 lpm with sleep and 4 lpm with exertion  [employer], Disp: , Rfl:  .  potassium chloride SA (K-DUR,KLOR-CON) 20 MEQ tablet, Take 1 tablet (20 mEq total) by mouth 2 (two) times daily., Disp: 180 tablet, Rfl: 3 .  pravastatin (PRAVACHOL) 20 MG tablet, Take 1 tablet (20 mg total) by mouth daily. (Patient taking differently: Take 20 mg by mouth at bedtime. ), Disp: 90 tablet, Rfl: 3 .  Riociguat (ADEMPAS) 2 MG TABS, Take 2 mg by mouth 3 (three) times daily., Disp: 90 tablet, Rfl: 11 .  Selexipag 200 MCG TABS, Take 1 tablet (200 mcg total) by mouth 2 (two) times daily., Disp: 60 tablet, Rfl: 6 .  torsemide (DEMADEX) 20 MG tablet, Take 2 tablets (40 mg total) by mouth daily. (Patient taking differently: Take 40 mg by mouth 2 (two) times daily. ), Disp: 180 tablet, Rfl: 3 .  trimethoprim-polymyxin b (POLYTRIM) ophthalmic solution, Place 1 drop into both eyes 3 (three) times daily., Disp: , Rfl:  .  Vitamin D, Ergocalciferol, (DRISDOL) 50000 units CAPS capsule, TAKE ONE CAPSULE BY MOUTH ONCE WEEKLY FOR 30 DAYS (Patient taking differently: Take 50,000 Units by mouth  every Monday. ), Disp: 12 capsule, Rfl: 3 .  zolpidem (AMBIEN) 5 MG tablet, TAKE ONE TABLET BY MOUTH AT BEDTIME, Disp: 30 tablet, Rfl: 2  Past Medical History: Past Medical History:  Diagnosis Date  . Allergy   . Depression   . GERD (gastroesophageal reflux disease)   . Hyperlipidemia   . Oxygen dependent   . Shortness of breath dyspnea   . Varicose veins     Tobacco Use: History  Smoking Status  . Former Smoker  . Packs/day: 0.25  . Years: 17.00  . Types: Cigarettes  . Quit date: 03/02/2015  Smokeless Tobacco  . Never  Used    Labs: Recent Review Flowsheet Data    Labs for ITP Cardiac and Pulmonary Rehab Latest Ref Rng & Units 06/13/2017 06/13/2017   HCO3 20.0 - 28.0 mmol/L 27.9 28.6(H)   TCO2 0 - 100 mmol/L 29 30   O2SAT % 75.0 73.0      Capillary Blood Glucose: No results found for: GLUCAP   ADL UCSD:     Pulmonary Assessment Scores    Row Name 04/17/17 1229         ADL UCSD   ADL Phase Entry     SOB Score total 67     Rest 0     Walk 8     Stairs 5     Bath 0     Dress 2     Shop 3       CAT Score   CAT Score 16       mMRC Score   mMRC Score 4        Pulmonary Function Assessment:     Pulmonary Function Assessment - 04/17/17 1226      Pulmonary Function Tests   FVC% 76 %   FEV1% 75 %   FEV1/FVC Ratio 97   DLCO% 26 %     Initial Spirometry Results   FVC% 76 %   FEV1% 74 %   FEV1/FVC Ratio 96     Post Bronchodilator Spirometry Results   FVC% 76 %   FEV1% 74 %   FEV1/FVC Ratio 96     Breath   Bilateral Breath Sounds Clear   Shortness of Breath Yes      Exercise Target Goals:    Exercise Program Goal: Individual exercise prescription set with THRR, safety & activity barriers. Participant demonstrates ability to understand and report RPE using BORG scale, to self-measure pulse accurately, and to acknowledge the importance of the exercise prescription.  Exercise Prescription Goal: Starting with aerobic activity 30 plus minutes a day, 3 days per week for initial exercise prescription. Provide home exercise prescription and guidelines that participant acknowledges understanding prior to discharge.  Activity Barriers & Risk Stratification:   6 Minute Walk:     6 Minute Walk    Row Name 04/17/17 1152         6 Minute Walk   Phase Initial     Distance 750 feet     Distance % Change 0 %     Walk Time 6 minutes     # of Rest Breaks 1     MPH 1.42     METS 2.08     RPE 15     Perceived Dyspnea  16     VO2 Peak 7.44     Symptoms No     Resting HR  74 bpm     Resting BP 94/50  Max Ex. HR 143 bpm     Max Ex. BP 134/62     2 Minute Post BP 106/56        Oxygen Initial Assessment:     Oxygen Initial Assessment - 04/17/17 1235      Home Oxygen   Home Oxygen Device Home Concentrator;Portable Concentrator   Sleep Oxygen Prescription None   Home Exercise Oxygen Prescription Continuous   Liters per minute 4   Home at Rest Exercise Oxygen Prescription Continuous   Liters per minute 2   Compliance with Home Oxygen Use Yes     Initial 6 min Walk   Oxygen Used Continuous   Liters per minute 4   Resting Oxygen Saturation  during 6 min walk 94 %   Exercise Oxygen Saturation  during 6 min walk 72 %  Had patient stop to rest, increase oxygen to 6 liters, SaO2 increased to 90 mg/dl. then test was resumed and patient finished the walk test.      Program Oxygen Prescription   Program Oxygen Prescription Continuous     Intervention   Short Term Goals To learn and demonstrate proper purse lipped breathing techniques or other breathing techniques.;To learn and exhibit compliance with exercise, home and travel O2 prescription;To Learn and understand importance of maintaining oxygen saturations>88%   Long  Term Goals Maintenance of O2 saturations>88%      Oxygen Re-Evaluation:     Oxygen Re-Evaluation    Row Name 05/26/17 1327 06/19/17 0748           Program Oxygen Prescription   Program Oxygen Prescription Continuous Continuous      Liters per minute 2 2      FiO2% 91 92        Home Oxygen   Home Oxygen Device Home Concentrator Home Concentrator      Sleep Oxygen Prescription Continuous Continuous      Liters per minute 2 2      Home Exercise Oxygen Prescription Continuous Continuous      Liters per minute 2 2      Home at Rest Exercise Oxygen Prescription Continuous Continuous      Liters per minute 2 2      Compliance with Home Oxygen Use Yes Yes        Goals/Expected Outcomes   Short Term Goals To Learn and  understand importance of maintaining oxygen saturations>88% To learn and exhibit compliance with exercise, home and travel O2 prescription      Long  Term Goals Maintenance of O2 saturations>88% Exhibits compliance with exercise, home and travel O2 prescription      Comments Patient is compliant with her O2 prescription. Her O2 saturation is 91-97 at PR. Her O2 runs at 2 L/min.   -      Goals/Expected Outcomes Patient will continue to be compliant with her O2 prescription and continue to maintain her O2 saturation levels at 91-97.  Patient will continue to be compliant with her O2 prescription.         Oxygen Discharge (Final Oxygen Re-Evaluation):     Oxygen Re-Evaluation - 06/19/17 0748      Program Oxygen Prescription   Program Oxygen Prescription Continuous   Liters per minute 2   FiO2% 92     Home Oxygen   Home Oxygen Device Home Concentrator   Sleep Oxygen Prescription Continuous   Liters per minute 2   Home Exercise Oxygen Prescription Continuous   Liters per minute 2   Home  at Rest Exercise Oxygen Prescription Continuous   Liters per minute 2   Compliance with Home Oxygen Use Yes     Goals/Expected Outcomes   Short Term Goals To learn and exhibit compliance with exercise, home and travel O2 prescription   Long  Term Goals Exhibits compliance with exercise, home and travel O2 prescription   Goals/Expected Outcomes Patient will continue to be compliant with her O2 prescription.      Initial Exercise Prescription:     Initial Exercise Prescription - 04/17/17 1100      Date of Initial Exercise RX and Referring Provider   Date 04/17/17   Referring Provider Dr. [name]     Oxygen   Oxygen Continuous   Liters 6     Treadmill   MPH 1.4   Grade 0   Minutes 15   METs 2     NuStep   Level 2   SPM 15   Minutes 20   METs 1.8     Prescription Details   Frequency (times per week) 2   Duration Progress to 30 minutes of continuous aerobic without signs/symptoms  of physical distress     Intensity   THRR 40-80% of Max Heartrate [phone]   Ratings of Perceived Exertion 11-13   Perceived Dyspnea 0-4     Progression   Progression Continue progressive overload as per policy without signs/symptoms or physical distress.     Resistance Training   Training Prescription Yes   Weight 1   Reps 10-15      Perform Capillary Blood Glucose checks as needed.  Exercise Prescription Changes:      Exercise Prescription Changes    Row Name 04/28/17 1400 05/13/17 1500 05/22/17 0800 06/12/17 0700       Response to Exercise   Blood Pressure (Admit) 122/80 116/58 100/56 106/56    Blood Pressure (Exercise) 136/82 138/72 118/68 124/66    Blood Pressure (Exit) 92/60 124/68 104/66 104/60    Heart Rate (Admit) 71 bpm 72 bpm 76 bpm 65 bpm    Heart Rate (Exercise) 92 bpm 76 bpm 86 bpm 111 bpm    Heart Rate (Exit) 72 bpm 93 bpm 72 bpm 77 bpm    Oxygen Saturation (Admit) 92 % 94 % 90 % 93 %    Oxygen Saturation (Exercise) 90 % 90 % 95 % 90 %    Oxygen Saturation (Exit) 97 % 94 % 94 % 94 %    Rating of Perceived Exertion (Exercise) '13 12 12 13    '$ Perceived Dyspnea (Exercise) '13 12 12 12    '$ Duration Progress to 30 minutes of  aerobic without signs/symptoms of physical distress Progress to 30 minutes of  aerobic without signs/symptoms of physical distress Progress to 30 minutes of  aerobic without signs/symptoms of physical distress Progress to 30 minutes of  aerobic without signs/symptoms of physical distress    Intensity THRR unchanged THRR unchanged THRR unchanged THRR unchanged      Progression   Progression Continue to progress workloads to maintain intensity without signs/symptoms of physical distress. Continue to progress workloads to maintain intensity without signs/symptoms of physical distress. Continue to progress workloads to maintain intensity without signs/symptoms of physical distress. Continue to progress workloads to maintain intensity without  signs/symptoms of physical distress.      Resistance Training   Training Prescription Yes Yes Yes Yes    Weight 1 1 0 0    Reps 10-15 10-15 10-15 10-15      Oxygen  Oxygen Continuous Continuous Continuous Continuous    Liters '6 6 6 6      '$ Treadmill   MPH 1.4 1.4 1.5 1.6    Grade 0 0 0 0    Minutes '15 15 15 15    '$ METs 2 2 2.15 2.2      NuStep   Level '2 3 3 3    '$ SPM '16 17 25 20    '$ Minutes '20 20 20 20    '$ METs 3.73 3.74 3.73 2.8      Home Exercise Plan   Plans to continue exercise at Home (comment) Home (comment) Home (comment) Home (comment)    Frequency Add 2 additional days to program exercise sessions. Add 2 additional days to program exercise sessions. Add 2 additional days to program exercise sessions. Add 2 additional days to program exercise sessions.       Exercise Comments:      Exercise Comments    Row Name 04/28/17 1449 05/13/17 1532 05/22/17 0819 06/12/17 0739     Exercise Comments Patient has just recently started PR and will be progressed in time.  Patient is doing well in PR. Patient is doing well in PR.  Patient is doing well in PR.        Exercise Goals and Review:      Exercise Goals    Row Name 04/17/17 1256             Exercise Goals   Increase Physical Activity Yes       Intervention Provide advice, education, support and counseling about physical activity/exercise needs.;Develop an individualized exercise prescription for aerobic and resistive training based on initial evaluation findings, risk stratification, comorbidities and participant's personal goals.       Expected Outcomes Achievement of increased cardiorespiratory fitness and enhanced flexibility, muscular endurance and strength shown through measurements of functional capacity and personal statement of participant.       Increase Strength and Stamina Yes       Intervention Provide advice, education, support and counseling about physical activity/exercise needs.;Develop an  individualized exercise prescription for aerobic and resistive training based on initial evaluation findings, risk stratification, comorbidities and participant's personal goals.       Expected Outcomes Achievement of increased cardiorespiratory fitness and enhanced flexibility, muscular endurance and strength shown through measurements of functional capacity and personal statement of participant.          Exercise Goals Re-Evaluation :     Exercise Goals Re-Evaluation    Row Name 05/26/17 1337 06/19/17 0744           Exercise Goal Re-Evaluation   Exercise Goals Review Increase Physical Activity;Increase Strenth and Stamina Increase Physical Activity;Increase Strenth and Stamina      Comments After completing 11 sessions, patient is progressing with some increased strength and stamina. She says she has not increased her activity but feels the program is benefiting her.  Patient has completed 16 sessions with progression in her treadmill speed and nustep level. She says she is getting stronger and the program is helping her meet her goals. Will continue to monitor.       Expected Outcomes Patient will complete the program continued increased strength, stamina, and activity.  Patient will complete the program with increased strength, stamina, and activity.         Discharge Exercise Prescription (Final Exercise Prescription Changes):     Exercise Prescription Changes - 06/12/17 0700      Response to Exercise  Blood Pressure (Admit) 106/56   Blood Pressure (Exercise) 124/66   Blood Pressure (Exit) 104/60   Heart Rate (Admit) 65 bpm   Heart Rate (Exercise) 111 bpm   Heart Rate (Exit) 77 bpm   Oxygen Saturation (Admit) 93 %   Oxygen Saturation (Exercise) 90 %   Oxygen Saturation (Exit) 94 %   Rating of Perceived Exertion (Exercise) 13   Perceived Dyspnea (Exercise) 12   Duration Progress to 30 minutes of  aerobic without signs/symptoms of physical distress   Intensity THRR  unchanged     Progression   Progression Continue to progress workloads to maintain intensity without signs/symptoms of physical distress.     Resistance Training   Training Prescription Yes   Weight 0   Reps 10-15     Oxygen   Oxygen Continuous   Liters 6     Treadmill   MPH 1.6   Grade 0   Minutes 15   METs 2.2     NuStep   Level 3   SPM 20   Minutes 20   METs 2.8     Home Exercise Plan   Plans to continue exercise at Home (comment)   Frequency Add 2 additional days to program exercise sessions.      Nutrition:  Target Goals: Understanding of nutrition guidelines, daily intake of sodium '1500mg'$ , cholesterol '200mg'$ , calories 30% from fat and 7% or less from saturated fats, daily to have 5 or more servings of fruits and vegetables.  Biometrics:     Pre Biometrics - 04/17/17 1154      Pre Biometrics   Height '5\' 7"'$  (1.702 m)   Weight 259 lb 0.7 oz (117.5 kg)   Waist Circumference 43 inches   Hip Circumference 52 inches   Waist to Hip Ratio 0.83 %   BMI (Calculated) 40.7   Triceps Skinfold 31 mm   % Body Fat 48 %   Grip Strength 47.86 kg   Flexibility 0 in   Single Leg Stand 2 seconds       Nutrition Therapy Plan and Nutrition Goals:   Nutrition Discharge: Rate Your Plate Scores:   Nutrition Goals Re-Evaluation:   Nutrition Goals Discharge (Final Nutrition Goals Re-Evaluation):   Psychosocial: Target Goals: Acknowledge presence or absence of significant depression and/or stress, maximize coping skills, provide positive support system. Participant is able to verbalize types and ability to use techniques and skills needed for reducing stress and depression.  Initial Review & Psychosocial Screening:     Initial Psych Review & Screening - 04/17/17 1504      Initial Review   Current issues with Current Depression;History of Depression     Family Dynamics   Good Support System? Yes     Barriers   Psychosocial barriers to participate in program  Psychosocial barriers identified (see note)  QOL overal score 16.69. Discussed counseling. Patient stated she did not feel she needed it at this time.      Screening Interventions   Interventions Encouraged to exercise      Quality of Life Scores:     Quality of Life - 04/17/17 1155      Quality of Life Scores   Health/Function Pre 14.91 %   Socioeconomic Pre 17.29 %   Psych/Spiritual Pre 19.86 %   Family Pre 17.1 %   GLOBAL Pre 16.69 %      PHQ-9: Recent Review Flowsheet Data    Depression screen [facility] 2/9 05/28/2017 04/17/2017 04/08/2017 03/03/2017 12/13/2016   Decreased  Interest 0 0 2 1 0   Down, Depressed, Hopeless 2 - 2 1 0   PHQ - 2 Score 2 0 4 2 0   Altered sleeping '2 2 1 2 '$ -   Tired, decreased energy '2 2 1 2 '$ -   Change in appetite '1 1 1 2 '$ -   Feeling bad or failure about yourself  '1 1 1 2 '$ -   Trouble concentrating 0 0 1 3 -   Moving slowly or fidgety/restless 1 0 1 1 -   Suicidal thoughts 0 0 1 1 -   PHQ-9 Score '9 6 11 15 '$ -     Interpretation of Total Score  Total Score Depression Severity:  1-4 = Minimal depression, 5-9 = Mild depression, 10-14 = Moderate depression, 15-19 = Moderately severe depression, 20-27 = Severe depression   Psychosocial Evaluation and Intervention:     Psychosocial Evaluation - 04/17/17 1506      Psychosocial Evaluation & Interventions   Interventions Stress management education;Relaxation education;Encouraged to exercise with the program and follow exercise prescription   Comments Patient QOL overall scores where low 16.69. Discussed patient seeking counseling. Patient stated she did not feel she needed it at this time.    Continue Psychosocial Services  Follow up required by staff      Psychosocial Re-Evaluation:     Psychosocial Re-Evaluation    Row Name 05/26/17 1332 06/19/17 0746           Psychosocial Re-Evaluation   Current issues with Current Depression;History of Depression Current Depression      Comments Patient's  initial QOL score was 16.69 and her PHQ-9 score was 7. She is currently being treated with Celexa 40 mg. She is not interested in counseling but feels her depression is managed on medication.  Patient's depression is managed with citalopram 40 mg daily.       Expected Outcomes Patient's QOL and PHQ-9 scores will improve or remain the same at discharge.  Patient's depression will be managed at discharge. Her PHQ-9 and QOL scores will improve or remain the same at discharge.       Interventions Relaxation education;Stress management education;Encouraged to attend Pulmonary Rehabilitation for the exercise Encouraged to attend Pulmonary Rehabilitation for the exercise;Relaxation education;Stress management education      Continue Psychosocial Services  Follow up required by staff Follow up required by staff         Psychosocial Discharge (Final Psychosocial Re-Evaluation):     Psychosocial Re-Evaluation - 06/19/17 0746      Psychosocial Re-Evaluation   Current issues with Current Depression   Comments Patient's depression is managed with citalopram 40 mg daily.    Expected Outcomes Patient's depression will be managed at discharge. Her PHQ-9 and QOL scores will improve or remain the same at discharge.    Interventions Encouraged to attend Pulmonary Rehabilitation for the exercise;Relaxation education;Stress management education   Continue Psychosocial Services  Follow up required by staff       Education: Education Goals: Education classes will be provided on a weekly basis, covering required topics. Participant will state understanding/return demonstration of topics presented.  Learning Barriers/Preferences:     Learning Barriers/Preferences - 04/17/17 1159      Learning Barriers/Preferences   Learning Barriers None   Learning Preferences Verbal Instruction;Individual Instruction      Education Topics: How Lungs Work and Diseases: - Discuss the anatomy of the lungs and diseases  that can affect the lungs, such as COPD.   Exercise: -  Discuss the importance of exercise, FITT principles of exercise, normal and abnormal responses to exercise, and how to exercise safely.   Environmental Irritants: -Discuss types of environmental irritants and how to limit exposure to environmental irritants.   PULMONARY REHAB OTHER RESPIRATORY from 06/12/2017 in [place]  Date  04/24/17  Educator  [place]  Instruction Review Code  2- meets goals/outcomes      Meds/Inhalers and oxygen: - Discuss respiratory medications, definition of an inhaler and oxygen, and the proper way to use an inhaler and oxygen.   PULMONARY REHAB OTHER RESPIRATORY from 06/12/2017 in [place]  Date  05/01/17  Educator  [place]  Instruction Review Code  2- meets goals/outcomes      Energy Saving Techniques: - Discuss methods to conserve energy and decrease shortness of breath when performing activities of daily living.    PULMONARY REHAB OTHER RESPIRATORY from 06/12/2017 in [place]  Date  05/08/17  Educator  [place]  Instruction Review Code  2- meets goals/outcomes      Bronchial Hygiene / Breathing Techniques: - Discuss breathing mechanics, pursed-lip breathing technique,  proper posture, effective ways to clear airways, and other functional breathing techniques   PULMONARY REHAB OTHER RESPIRATORY from 06/12/2017 in [place]  Date  05/15/17  Educator  [place]  Instruction Review Code  2- meets goals/outcomes      Cleaning Equipment: - Provides group verbal and written instruction about the health risks of elevated stress, cause of high stress, and healthy ways to reduce stress.   PULMONARY REHAB OTHER RESPIRATORY from 06/12/2017 in [place]  Date  05/22/17  Educator  [place]  Instruction Review Code  2- meets goals/outcomes      Nutrition I: Fats: - Discuss the types of cholesterol, what  cholesterol does to the body, and how cholesterol levels can be controlled.   Nutrition II: Labels: -Discuss the different components of food labels and how to read food labels.   Respiratory Infections: - Discuss the signs and symptoms of respiratory infections, ways to prevent respiratory infections, and the importance of seeking medical treatment when having a respiratory infection.   PULMONARY REHAB OTHER RESPIRATORY from 06/12/2017 in [place]  Date  06/12/17  Educator  [place]  Instruction Review Code  2- meets goals/outcomes      Stress I: Signs and Symptoms: - Discuss the causes of stress, how stress may lead to anxiety and depression, and ways to limit stress.   Stress II: Relaxation: -Discuss relaxation techniques to limit stress.   Oxygen for Home/Travel: - Discuss how to prepare for travel when on oxygen and proper ways to transport and store oxygen to ensure safety.   Knowledge Questionnaire Score:     Knowledge Questionnaire Score - 04/17/17 1226      Knowledge Questionnaire Score   Pre Score 11/14      Core Components/Risk Factors/Patient Goals at Admission:     Personal Goals and Risk Factors at Admission - 04/17/17 1257      Core Components/Risk Factors/Patient Goals on Admission    Weight Management Weight Maintenance   Improve shortness of breath with ADL's Yes   Intervention Provide education, individualized exercise plan and daily activity instruction to help decrease symptoms of SOB with activities of daily living.   Expected Outcomes Short Term: Achieves a reduction of symptoms when performing activities of daily living.   Personal Goal Other Yes   Personal Goal Breathe better,  Walk longer without SOB   Intervention Attend program 2 x week and supplement at home 3 x week.    Expected Outcomes Reach personal goals      Core Components/Risk Factors/Patient Goals Review:      Goals and Risk Factor Review    Row Name  05/26/17 1329 06/19/17 0742           Core Components/Risk Factors/Patient Goals Review   Personal Goals Review Weight Management/Obesity;Improve shortness of breath with ADL's  Breathe better; walk longer.  Weight Management/Obesity;Improve shortness of breath with ADL's  Breathe better; walk longer.      Review Patient has completed 11 sessions losing 5.3 lbs. She is progressing in the program. Her O2 usage remains the same. She reports no improvement in her SOB but she does feel stronger and better overall. Will continue to monitor for progress.  Patient has completed 16 sessions losing 3.3 lbs. She is progressing and doing well in the program saying her SOB has improved some. Will continue to monitor.       Expected Outcomes Patient will complete the program and continue to work toward meeting her personal goals.  Patient will complete the program and meet her personal goals.          Core Components/Risk Factors/Patient Goals at Discharge (Final Review):      Goals and Risk Factor Review - 06/19/17 0742      Core Components/Risk Factors/Patient Goals Review   Personal Goals Review Weight Management/Obesity;Improve shortness of breath with ADL's  Breathe better; walk longer.   Review Patient has completed 16 sessions losing 3.3 lbs. She is progressing and doing well in the program saying her SOB has improved some. Will continue to monitor.    Expected Outcomes Patient will complete the program and meet her personal goals.       ITP Comments:     ITP Comments    Row Name 04/30/17 1322 05/15/17 1247         ITP Comments Patient new to program completing 4 sessions. Will continue to monitor for progress.  Patient met with Registered Dietitian to discuss nutrition topics including: Heart healthty eating, heart healthy cooking and making smart choices when shopping; Portion control; weight management; and hydration. Patient attended a group session with the hospital chaplian called  [facility] to discuss and share how this recent diagnosis has effected their life         Comments: ITP 30 Day REVIEW Pt is making expected progress toward pulmonary rehab goals after completing 16 sessions. Recommend continued exercise, life style modification, education, and utilization of breathing techniques to increase stamina and strength and decrease shortness of breath with exertion.

## 2017-06-24 ENCOUNTER — Encounter (HOSPITAL_COMMUNITY)
Admission: RE | Admit: 2017-06-24 | Discharge: 2017-06-24 | Disposition: A | Discharge status: Home / Self Care | Payer: Medicare PPO | Source: Ambulatory Visit | Attending: Cardiology | Admitting: Cardiology

## 2017-06-24 DIAGNOSIS — I2721 Secondary pulmonary arterial hypertension: Secondary | ICD-10-CM

## 2017-06-24 NOTE — Progress Notes (Signed)
Daily Session Note  Patient Details  Name: SLOKA VOLANTE MRN: [id number] Date of Birth: [birth year] Referring Provider:     PULMONARY REHAB OTHER RESP ORIENTATION from 04/17/2017 in [place]  Referring Provider  Dr. [name]      Encounter Date: 06/24/2017  Check In:     Session Check In - 06/24/17 1340      Check-In   Location [facility]   Staff Present [name], MS, EP, [facility], Exercise Physiologist;Milarose Savich Luther Parody, BS, EP, Exercise Physiologist   Supervising physician immediately available to respond to emergencies See telemetry face sheet for immediately available MD   Medication changes reported     No   Fall or balance concerns reported    No   Warm-up and Cool-down Performed as group-led instruction   Resistance Training Performed Yes   VAD Patient? No     Pain Assessment   Currently in Pain? No/denies   Pain Score 0-No pain   Multiple Pain Sites No      Capillary Blood Glucose: No results found for this or any previous visit (from the past 24 hour(s)).    History  Smoking Status  . Former Smoker  . Packs/day: 0.25  . Years: 17.00  . Types: Cigarettes  . Quit date: 03/02/2015  Smokeless Tobacco  . Never Used    Goals Met:  Independence with exercise equipment Improved SOB with ADL's Using PLB without cueing & demonstrates good technique Exercise tolerated well No report of cardiac concerns or symptoms Strength training completed today  Goals Unmet:  Not Applicable  Comments: Check out 230   Dr. Sinda Du is Medical Director for [facility].

## 2017-06-26 ENCOUNTER — Encounter (HOSPITAL_COMMUNITY)
Admission: RE | Admit: 2017-06-26 | Discharge: 2017-06-26 | Disposition: A | Discharge status: Home / Self Care | Payer: Medicare PPO | Source: Ambulatory Visit | Attending: Cardiology | Admitting: Cardiology

## 2017-06-26 DIAGNOSIS — I2721 Secondary pulmonary arterial hypertension: Secondary | ICD-10-CM | POA: Diagnosis not present

## 2017-06-27 DIAGNOSIS — H5034 Intermittent alternating exotropia: Secondary | ICD-10-CM | POA: Diagnosis not present

## 2017-07-01 ENCOUNTER — Encounter (HOSPITAL_COMMUNITY)
Admission: RE | Admit: 2017-07-01 | Discharge: 2017-07-01 | Disposition: A | Discharge status: Home / Self Care | Payer: Medicare PPO | Source: Ambulatory Visit | Attending: Cardiology | Admitting: Cardiology

## 2017-07-01 ENCOUNTER — Telehealth (HOSPITAL_COMMUNITY): Payer: Self-pay | Admitting: *Deleted

## 2017-07-01 NOTE — Telephone Encounter (Signed)
Can decrease torsemide to 20 mg daily x 2 days then back to 40 mg daily.

## 2017-07-01 NOTE — Telephone Encounter (Signed)
Patient called and reported that she has started to feel dizzy and tired since last Thursday.  Her BP reading today at rehab was 100/58 and she stated on Sunday at home it was 93/52. Weight has stayed the same per patient and no other symptoms.    I will forward to [name] to review.

## 2017-07-01 NOTE — Progress Notes (Signed)
Incomplete Session Note  Patient Details  Name: Kelly Donaldson MRN: [id number] Date of Birth: Feb 02, 1956 Referring Provider:     PULMONARY REHAB OTHER RESP ORIENTATION from 04/17/2017 in [facility] C[facility] REH[facility]IL[facility]ATION  Referring Provider  Dr. Massie Bougie did not complete her rehab session.  She c/o of being dizzie. BP 100/58. She said she stayed in bed all weekend. We called her heart doctor [name]. He suggested she not do Pulmonary Rehab today. If her dizziness continue to call the office to schedule a visit.

## 2017-07-02 MED ORDER — TORSEMIDE 20 MG PO TABS: 40.0000 mg | ORAL_TABLET | Freq: Every day | ORAL | 3 refills | Status: DC

## 2017-07-02 NOTE — Telephone Encounter (Signed)
Called and spoke with patient she is agreeable with plan.  

## 2017-07-02 NOTE — Telephone Encounter (Signed)
Called to leave message but mailbox was full.  Will continue trying to reach patient.

## 2017-07-03 ENCOUNTER — Encounter (HOSPITAL_COMMUNITY)
Admission: RE | Admit: 2017-07-03 | Discharge: 2017-07-03 | Disposition: A | Discharge status: Home / Self Care | Payer: Medicare PPO | Source: Ambulatory Visit | Attending: Cardiology | Admitting: Cardiology

## 2017-07-03 DIAGNOSIS — I2721 Secondary pulmonary arterial hypertension: Secondary | ICD-10-CM | POA: Diagnosis not present

## 2017-07-08 ENCOUNTER — Inpatient Hospital Stay (HOSPITAL_COMMUNITY): Admission: RE | Admit: 2017-07-08 | Payer: Medicare PPO | Source: Ambulatory Visit | Admitting: Cardiology

## 2017-07-08 ENCOUNTER — Encounter (HOSPITAL_COMMUNITY)
Admission: RE | Admit: 2017-07-08 | Discharge: 2017-07-08 | Disposition: A | Discharge status: Home / Self Care | Payer: Medicare PPO | Source: Ambulatory Visit | Attending: Cardiology | Admitting: Cardiology

## 2017-07-08 DIAGNOSIS — I2721 Secondary pulmonary arterial hypertension: Secondary | ICD-10-CM

## 2017-07-08 NOTE — Progress Notes (Signed)
Daily Session Note  Patient Details  Name: Kelly Donaldson MRN: [id number] Date of Birth: 10/26/1956 Referring Provider:     PULMONARY REHAB OTHER RESP ORIENTATION from 04/17/2017 in [facility] CARDIAC REHABILITATION  Referring Provider  Dr. Mclean      Encounter Date: 07/08/2017  Check In:     Session Check In - 07/08/17 1330      Check-In   Location [facility]   Staff Present  , MS, EP, [facility], Exercise Physiologist;Debra Johnson, RN, BSN   Supervising physician immediately available to respond to emergencies See telemetry face sheet for immediately available MD   Medication changes reported     No   Fall or balance concerns reported    No   Tobacco Cessation No Change   Warm-up and Cool-down Performed as group-led instruction   Resistance Training Performed No   VAD Patient? No     Pain Assessment   Currently in Pain? No/denies   Pain Score 0-No pain   Multiple Pain Sites No      Capillary Blood Glucose: No results found for this or any previous visit (from the past 24 hour(s)).    History  Smoking Status  . Former Smoker  . Packs/day: 0.25  . Years: 17.00  . Types: Cigarettes  . Quit date: 03/02/2015  Smokeless Tobacco  . Never Used    Goals Met:  Proper associated with RPD/PD & O2 Sat Improved SOB with ADL's Using PLB without cueing & demonstrates good technique Exercise tolerated well No report of cardiac concerns or symptoms Strength training completed today  Goals Unmet:  Not Applicable  Comments: Check out: 1430   Dr. Edward Hawkins is Medical Director for [facility]. 

## 2017-07-10 ENCOUNTER — Encounter (HOSPITAL_COMMUNITY)
Admission: RE | Admit: 2017-07-10 | Discharge: 2017-07-10 | Disposition: A | Discharge status: Home / Self Care | Payer: Medicare PPO | Source: Ambulatory Visit | Attending: Cardiology | Admitting: Cardiology

## 2017-07-10 DIAGNOSIS — J449 Chronic obstructive pulmonary disease, unspecified: Secondary | ICD-10-CM | POA: Diagnosis not present

## 2017-07-10 DIAGNOSIS — M79671 Pain in right foot: Secondary | ICD-10-CM | POA: Diagnosis not present

## 2017-07-10 DIAGNOSIS — F329 Major depressive disorder, single episode, unspecified: Secondary | ICD-10-CM | POA: Diagnosis not present

## 2017-07-10 DIAGNOSIS — I2721 Secondary pulmonary arterial hypertension: Secondary | ICD-10-CM | POA: Insufficient documentation

## 2017-07-10 DIAGNOSIS — S93601A Unspecified sprain of right foot, initial encounter: Secondary | ICD-10-CM | POA: Diagnosis not present

## 2017-07-10 DIAGNOSIS — Z87891 Personal history of nicotine dependence: Secondary | ICD-10-CM | POA: Diagnosis not present

## 2017-07-10 DIAGNOSIS — I509 Heart failure, unspecified: Secondary | ICD-10-CM | POA: Diagnosis not present

## 2017-07-10 DIAGNOSIS — Z791 Long term (current) use of non-steroidal anti-inflammatories (NSAID): Secondary | ICD-10-CM | POA: Diagnosis not present

## 2017-07-10 DIAGNOSIS — S99921A Unspecified injury of right foot, initial encounter: Secondary | ICD-10-CM | POA: Diagnosis not present

## 2017-07-10 DIAGNOSIS — Z9981 Dependence on supplemental oxygen: Secondary | ICD-10-CM | POA: Diagnosis not present

## 2017-07-10 DIAGNOSIS — Z79899 Other long term (current) drug therapy: Secondary | ICD-10-CM | POA: Diagnosis not present

## 2017-07-10 DIAGNOSIS — W010XXA Fall on same level from slipping, tripping and stumbling without subsequent striking against object, initial encounter: Secondary | ICD-10-CM | POA: Diagnosis not present

## 2017-07-10 NOTE — Progress Notes (Signed)
Incomplete Session Note  Patient Details  Name: Kelly Donaldson MRN: [id number] Date of Birth: [birth year] Referring Provider:     PULMONARY REHAB OTHER RESP ORIENTATION from 04/17/2017 in [facility] CARDIAC REHABILITATION  Referring Provider  Dr. Massie Bougie did not complete her rehab session.  Patient came in to Cardiac Rehab and c/o foot pain due to an incident of hitting her foot at home. We sent patient home encouraging her to see her PCP. Told her we needed physician clearance before she could return.

## 2017-07-15 ENCOUNTER — Encounter (HOSPITAL_COMMUNITY): Payer: Medicare PPO

## 2017-07-17 ENCOUNTER — Encounter (HOSPITAL_COMMUNITY): Payer: Medicare PPO

## 2017-07-17 NOTE — Progress Notes (Signed)
Pulmonary Individual Treatment Plan  Patient Details  Name: Kelly Donaldson MRN: [id number] Date of Birth: [birth year] Referring Provider:     PULMONARY REHAB OTHER RESP ORIENTATION from 04/17/2017 in [place]  Referring Provider  Dr. [name]      Initial Encounter Date:    [facility] from 04/17/2017 in [place]  Date  04/17/17  Referring Provider  Dr. [name]      Visit Diagnosis: PAH (pulmonary artery hypertension) ([place])  Patient's Home Medications on Admission:   Current Outpatient Prescriptions:  .  aspirin EC 81 MG tablet, Take 1 tablet (81 mg total) by mouth daily., Disp: 90 tablet, Rfl: 3 .  cefdinir (OMNICEF) 300 MG capsule, Take 1 capsule (300 mg total) by mouth 2 (two) times daily. 1 po BID (Patient not taking: Reported on 06/09/2017), Disp: 20 capsule, Rfl: 0 .  cetirizine (ZYRTEC) 10 MG tablet, Take 1 tablet (10 mg total) by mouth daily. (Patient taking differently: Take 10 mg by mouth at bedtime. ), Disp: 90 tablet, Rfl: 3 .  citalopram (CELEXA) 40 MG tablet, TAKE ONE TABLET BY MOUTH ONCE DAILY FOR 30 DAYS, Disp: 30 tablet, Rfl: 2 .  HYDROcodone-acetaminophen (NORCO) 10-325 MG tablet, Take 1-2 tablets by mouth every 6 (six) hours as needed., Disp: 240 tablet, Rfl: 0 .  HYDROcodone-homatropine (HYCODAN) 5-1.5 MG/5ML syrup, Take 5-10 mLs by mouth every 6 (six) hours as needed. (Patient not taking: Reported on 06/09/2017), Disp: 240 mL, Rfl: 0 .  Ipratropium-Albuterol (COMBIVENT RESPIMAT) 20-100 MCG/ACT AERS respimat, Inhale 1 puff into the lungs every 6 (six) hours as needed for wheezing., Disp: 3 Inhaler, Rfl: 3 .  ipratropium-albuterol (DUONEB) 0.5-2.5 (3) MG/3ML SOLN, Take 3 mLs by nebulization 4 (four) times daily as needed. (Patient taking differently: Take 3 mLs by nebulization 4 (four) times daily as needed (for wheezing/shortness of breath). ), Disp: 360 mL, Rfl: 11 .  Macitentan (OPSUMIT) 10 MG TABS,  Take 1 tablet (10 mg total) by mouth daily., Disp: 30 tablet, Rfl: 11 .  meloxicam (MOBIC) 15 MG tablet, Take 1 tablet (15 mg total) by mouth daily. (Patient not taking: Reported on 06/09/2017), Disp: 30 tablet, Rfl: 11 .  meloxicam (MOBIC) 7.5 MG tablet, Take 7.5 mg by mouth at bedtime., Disp: , Rfl:  .  OXYGEN, 2.5 lpm with sleep and 4 lpm with exertion  [employer], Disp: , Rfl:  .  potassium chloride SA (K-DUR,KLOR-CON) 20 MEQ tablet, Take 1 tablet (20 mEq total) by mouth 2 (two) times daily., Disp: 180 tablet, Rfl: 3 .  pravastatin (PRAVACHOL) 20 MG tablet, Take 1 tablet (20 mg total) by mouth daily. (Patient taking differently: Take 20 mg by mouth at bedtime. ), Disp: 90 tablet, Rfl: 3 .  Riociguat (ADEMPAS) 2 MG TABS, Take 2 mg by mouth 3 (three) times daily., Disp: 90 tablet, Rfl: 11 .  Selexipag 200 MCG TABS, Take 1 tablet (200 mcg total) by mouth 2 (two) times daily., Disp: 60 tablet, Rfl: 6 .  torsemide (DEMADEX) 20 MG tablet, Take 2 tablets (40 mg total) by mouth daily., Disp: 180 tablet, Rfl: 3 .  trimethoprim-polymyxin b (POLYTRIM) ophthalmic solution, Place 1 drop into both eyes 3 (three) times daily., Disp: , Rfl:  .  Vitamin D, Ergocalciferol, (DRISDOL) 50000 units CAPS capsule, TAKE ONE CAPSULE BY MOUTH ONCE WEEKLY FOR 30 DAYS (Patient taking differently: Take 50,000 Units by mouth every Monday. ), Disp: 12 capsule, Rfl: 3 .  zolpidem (AMBIEN) 5  MG tablet, TAKE ONE TABLET BY MOUTH AT BEDTIME, Disp: 30 tablet, Rfl: 2  Past Medical History: Past Medical History:  Diagnosis Date  . Allergy   . Depression   . GERD (gastroesophageal reflux disease)   . Hyperlipidemia   . Oxygen dependent   . Shortness of breath dyspnea   . Varicose veins     Tobacco Use: History  Smoking Status  . Former Smoker  . Packs/day: 0.25  . Years: 17.00  . Types: Cigarettes  . Quit date: 03/02/2015  Smokeless Tobacco  . Never Used    Labs: Recent Review Flowsheet Data    Labs for ITP Cardiac  and Pulmonary Rehab Latest Ref Rng & Units 06/13/2017 06/13/2017   HCO3 20.0 - 28.0 mmol/L 27.9 28.6(H)   TCO2 0 - 100 mmol/L 29 30   O2SAT % 75.0 73.0      Capillary Blood Glucose: No results found for: GLUCAP   ADL UCSD:     Pulmonary Assessment Scores    Row Name 04/17/17 1229         ADL UCSD   ADL Phase Entry     SOB Score total 67     Rest 0     Walk 8     Stairs 5     Bath 0     Dress 2     Shop 3       CAT Score   CAT Score 16       mMRC Score   mMRC Score 4        Pulmonary Function Assessment:     Pulmonary Function Assessment - 04/17/17 1226      Pulmonary Function Tests   FVC% 76 %   FEV1% 75 %   FEV1/FVC Ratio 97   DLCO% 26 %     Initial Spirometry Results   FVC% 76 %   FEV1% 74 %   FEV1/FVC Ratio 96     Post Bronchodilator Spirometry Results   FVC% 76 %   FEV1% 74 %   FEV1/FVC Ratio 96     Breath   Bilateral Breath Sounds Clear   Shortness of Breath Yes      Exercise Target Goals:    Exercise Program Goal: Individual exercise prescription set with THRR, safety & activity barriers. Participant demonstrates ability to understand and report RPE using BORG scale, to self-measure pulse accurately, and to acknowledge the importance of the exercise prescription.  Exercise Prescription Goal: Starting with aerobic activity 30 plus minutes a day, 3 days per week for initial exercise prescription. Provide home exercise prescription and guidelines that participant acknowledges understanding prior to discharge.  Activity Barriers & Risk Stratification:   6 Minute Walk:     6 Minute Walk    Row Name 04/17/17 1152         6 Minute Walk   Phase Initial     Distance 750 feet     Distance % Change 0 %     Walk Time 6 minutes     # of Rest Breaks 1     MPH 1.42     METS 2.08     RPE 15     Perceived Dyspnea  16     VO2 Peak 7.44     Symptoms No     Resting HR 74 bpm     Resting BP 94/50     Max Ex. HR 143 bpm     Max Ex. BP  134/62  2 Minute Post BP 106/56        Oxygen Initial Assessment:     Oxygen Initial Assessment - 04/17/17 1235      Home Oxygen   Home Oxygen Device Home Concentrator;Portable Concentrator   Sleep Oxygen Prescription None   Home Exercise Oxygen Prescription Continuous   Liters per minute 4   Home at Rest Exercise Oxygen Prescription Continuous   Liters per minute 2   Compliance with Home Oxygen Use Yes     Initial 6 min Walk   Oxygen Used Continuous   Liters per minute 4   Resting Oxygen Saturation  during 6 min walk 94 %   Exercise Oxygen Saturation  during 6 min walk 72 %  Had patient stop to rest, increase oxygen to 6 liters, SaO2 increased to 90 mg/dl. then test was resumed and patient finished the walk test.      Program Oxygen Prescription   Program Oxygen Prescription Continuous     Intervention   Short Term Goals To learn and demonstrate proper purse lipped breathing techniques or other breathing techniques.;To learn and exhibit compliance with exercise, home and travel O2 prescription;To Learn and understand importance of maintaining oxygen saturations>88%   Long  Term Goals Maintenance of O2 saturations>88%      Oxygen Re-Evaluation:     Oxygen Re-Evaluation    Row Name 05/26/17 1327 06/19/17 0748 07/17/17 1329         Program Oxygen Prescription   Program Oxygen Prescription Continuous Continuous Continuous     Liters per minute '2 2 3     '$ FiO2% 91 92 91     Comments  -  - Patient uses 3-5 l/min during exercise session depending on her SOB and her O2 Sat levels.        Home Oxygen   Home Oxygen Device Home Concentrator Home Concentrator Home Concentrator;Portable Concentrator     Sleep Oxygen Prescription Continuous Continuous Continuous     Liters per minute '2 2 2     '$ Home Exercise Oxygen Prescription Continuous Continuous Continuous     Liters per minute '2 2 3     '$ Home at Rest Exercise Oxygen Prescription Continuous Continuous Continuous      Liters per minute '2 2 2     '$ Compliance with Home Oxygen Use Yes Yes Yes       Goals/Expected Outcomes   Short Term Goals To Learn and understand importance of maintaining oxygen saturations>88% To learn and exhibit compliance with exercise, home and travel O2 prescription To learn and exhibit compliance with exercise, home and travel O2 prescription;To Learn and understand importance of maintaining oxygen saturations>88%;To learn and understand importance of monitoring SPO2 with pulse oximeter and demonstrate accurate use of the pulse oximeter.;To learn and demonstrate proper purse lipped breathing techniques or other breathing techniques.     Long  Term Goals Maintenance of O2 saturations>88% Exhibits compliance with exercise, home and travel O2 prescription Verbalizes importance of monitoring SPO2 with pulse oximeter and return demonstration;Maintenance of O2 saturations>88%;Exhibits proper breathing techniques, such as purse lipped breathing or other method taught during program session     Comments Patient is compliant with her O2 prescription. Her O2 saturation is 91-97 at PR. Her O2 runs at 2 L/min.   - Patient compliant with O2 prescription.     Goals/Expected Outcomes Patient will continue to be compliant with her O2 prescription and continue to maintain her O2 saturation levels at 91-97.  Patient will continue to be  compliant with her O2 prescription. Patient will continue to be compliant with O2 prescription.        Oxygen Discharge (Final Oxygen Re-Evaluation):     Oxygen Re-Evaluation - 07/17/17 1329      Program Oxygen Prescription   Program Oxygen Prescription Continuous   Liters per minute 3   FiO2% 91   Comments Patient uses 3-5 l/min during exercise session depending on her SOB and her O2 Sat levels.      Home Oxygen   Home Oxygen Device Home Concentrator;Portable Concentrator   Sleep Oxygen Prescription Continuous   Liters per minute 2   Home Exercise Oxygen  Prescription Continuous   Liters per minute 3   Home at Rest Exercise Oxygen Prescription Continuous   Liters per minute 2   Compliance with Home Oxygen Use Yes     Goals/Expected Outcomes   Short Term Goals To learn and exhibit compliance with exercise, home and travel O2 prescription;To Learn and understand importance of maintaining oxygen saturations>88%;To learn and understand importance of monitoring SPO2 with pulse oximeter and demonstrate accurate use of the pulse oximeter.;To learn and demonstrate proper purse lipped breathing techniques or other breathing techniques.   Long  Term Goals Verbalizes importance of monitoring SPO2 with pulse oximeter and return demonstration;Maintenance of O2 saturations>88%;Exhibits proper breathing techniques, such as purse lipped breathing or other method taught during program session   Comments Patient compliant with O2 prescription.   Goals/Expected Outcomes Patient will continue to be compliant with O2 prescription.      Initial Exercise Prescription:     Initial Exercise Prescription - 04/17/17 1100      Date of Initial Exercise RX and Referring Provider   Date 04/17/17   Referring Provider Dr. [name]     Oxygen   Oxygen Continuous   Liters 6     Treadmill   MPH 1.4   Grade 0   Minutes 15   METs 2     NuStep   Level 2   SPM 15   Minutes 20   METs 1.8     Prescription Details   Frequency (times per week) 2   Duration Progress to 30 minutes of continuous aerobic without signs/symptoms of physical distress     Intensity   THRR 40-80% of Max Heartrate [phone]   Ratings of Perceived Exertion 11-13   Perceived Dyspnea 0-4     Progression   Progression Continue progressive overload as per policy without signs/symptoms or physical distress.     Resistance Training   Training Prescription Yes   Weight 1   Reps 10-15      Perform Capillary Blood Glucose checks as needed.  Exercise Prescription Changes:       Exercise Prescription Changes    Row Name 04/28/17 1400 05/13/17 1500 05/22/17 0800 06/12/17 0700 07/02/17 1400     Response to Exercise   Blood Pressure (Admit) 122/80 116/58 100/56 106/56 150/62   Blood Pressure (Exercise) 136/82 138/72 118/68 124/66 100/60   Blood Pressure (Exit) 92/60 124/68 104/66 104/60 98/58   Heart Rate (Admit) 71 bpm 72 bpm 76 bpm 65 bpm 74 bpm   Heart Rate (Exercise) 92 bpm 76 bpm 86 bpm 111 bpm 70 bpm   Heart Rate (Exit) 72 bpm 93 bpm 72 bpm 77 bpm 65 bpm   Oxygen Saturation (Admit) 92 % 94 % 90 % 93 % 93 %   Oxygen Saturation (Exercise) 90 % 90 % 95 % 90 % 92 %  Oxygen Saturation (Exit) 97 % 94 % 94 % 94 % 94 %   Rating of Perceived Exertion (Exercise) '13 12 12 13 15   '$ Perceived Dyspnea (Exercise) '13 12 12 12 15   '$ Duration Progress to 30 minutes of  aerobic without signs/symptoms of physical distress Progress to 30 minutes of  aerobic without signs/symptoms of physical distress Progress to 30 minutes of  aerobic without signs/symptoms of physical distress Progress to 30 minutes of  aerobic without signs/symptoms of physical distress Progress to 30 minutes of  aerobic without signs/symptoms of physical distress   Intensity THRR unchanged THRR unchanged THRR unchanged THRR unchanged THRR unchanged     Progression   Progression Continue to progress workloads to maintain intensity without signs/symptoms of physical distress. Continue to progress workloads to maintain intensity without signs/symptoms of physical distress. Continue to progress workloads to maintain intensity without signs/symptoms of physical distress. Continue to progress workloads to maintain intensity without signs/symptoms of physical distress. Continue to progress workloads to maintain intensity without signs/symptoms of physical distress.     Resistance Training   Training Prescription Yes Yes Yes Yes Yes   Weight 1 1 0 0 0   Reps 10-15 10-15 10-15 10-15 10-15     Oxygen   Oxygen Continuous  Continuous Continuous Continuous Continuous   Liters '6 6 6 6 6     '$ Treadmill   MPH 1.4 1.4 1.5 1.6 1.6   Grade 0 0 0 0 0   Minutes '15 15 15 15 15   '$ METs 2 2 2.15 2.2 2.2     NuStep   Level '2 3 3 3 1   '$ SPM '16 17 25 20 19   '$ Minutes '20 20 20 20 20   '$ METs 3.73 3.74 3.73 2.8 1.9     Home Exercise Plan   Plans to continue exercise at Home (comment) Home (comment) Home (comment) Home (comment) Home (comment)   Frequency Add 2 additional days to program exercise sessions. Add 2 additional days to program exercise sessions. Add 2 additional days to program exercise sessions. Add 2 additional days to program exercise sessions. Add 2 additional days to program exercise sessions.      Exercise Comments:      Exercise Comments    Row Name 04/28/17 1449 05/13/17 1532 05/22/17 0819 06/12/17 0739 07/02/17 1416   Exercise Comments Patient has just recently started PR and will be progressed in time.  Patient is doing well in PR. Patient is doing well in PR.  Patient is doing well in PR.  Patient has not been progressed due to health concerns      Exercise Goals and Review:      Exercise Goals    Row Name 04/17/17 1256             Exercise Goals   Increase Physical Activity Yes       Intervention Provide advice, education, support and counseling about physical activity/exercise needs.;Develop an individualized exercise prescription for aerobic and resistive training based on initial evaluation findings, risk stratification, comorbidities and participant's personal goals.       Expected Outcomes Achievement of increased cardiorespiratory fitness and enhanced flexibility, muscular endurance and strength shown through measurements of functional capacity and personal statement of participant.       Increase Strength and Stamina Yes       Intervention Provide advice, education, support and counseling about physical activity/exercise needs.;Develop an individualized exercise prescription for  aerobic and resistive training based on  initial evaluation findings, risk stratification, comorbidities and participant's personal goals.       Expected Outcomes Achievement of increased cardiorespiratory fitness and enhanced flexibility, muscular endurance and strength shown through measurements of functional capacity and personal statement of participant.          Exercise Goals Re-Evaluation :     Exercise Goals Re-Evaluation    Row Name 05/26/17 1337 06/19/17 0744 07/17/17 1339         Exercise Goal Re-Evaluation   Exercise Goals Review Increase Physical Activity;Increase Strenth and Stamina Increase Physical Activity;Increase Strenth and Stamina Increase Physical Activity;Increase Strenth and Stamina     Comments After completing 11 sessions, patient is progressing with some increased strength and stamina. She says she has not increased her activity but feels the program is benefiting her.  Patient has completed 16 sessions with progression in her treadmill speed and nustep level. She says she is getting stronger and the program is helping her meet her goals. Will continue to monitor.  Patient has completed 21 sessions. She has not been progressed some with increased treadmill speed since last 30 day review. She has had some issues with hypotension and now has a sprained ankle that has prevented her attending. Will continue to monitor.      Expected Outcomes Patient will complete the program continued increased strength, stamina, and activity.  Patient will complete the program with increased strength, stamina, and activity. Patient will be able to return to the program and complete with increased strength, stamina, and activity.        Discharge Exercise Prescription (Final Exercise Prescription Changes):     Exercise Prescription Changes - 07/02/17 1400      Response to Exercise   Blood Pressure (Admit) 150/62   Blood Pressure (Exercise) 100/60   Blood Pressure (Exit) 98/58    Heart Rate (Admit) 74 bpm   Heart Rate (Exercise) 70 bpm   Heart Rate (Exit) 65 bpm   Oxygen Saturation (Admit) 93 %   Oxygen Saturation (Exercise) 92 %   Oxygen Saturation (Exit) 94 %   Rating of Perceived Exertion (Exercise) 15   Perceived Dyspnea (Exercise) 15   Duration Progress to 30 minutes of  aerobic without signs/symptoms of physical distress   Intensity THRR unchanged     Progression   Progression Continue to progress workloads to maintain intensity without signs/symptoms of physical distress.     Resistance Training   Training Prescription Yes   Weight 0   Reps 10-15     Oxygen   Oxygen Continuous   Liters 6     Treadmill   MPH 1.6   Grade 0   Minutes 15   METs 2.2     NuStep   Level 1   SPM 19   Minutes 20   METs 1.9     Home Exercise Plan   Plans to continue exercise at Home (comment)   Frequency Add 2 additional days to program exercise sessions.      Nutrition:  Target Goals: Understanding of nutrition guidelines, daily intake of sodium '1500mg'$ , cholesterol '200mg'$ , calories 30% from fat and 7% or less from saturated fats, daily to have 5 or more servings of fruits and vegetables.  Biometrics:     Pre Biometrics - 04/17/17 1154      Pre Biometrics   Height '5\' 7"'$  (1.702 m)   Weight 259 lb 0.7 oz (117.5 kg)   Waist Circumference 43 inches   Hip Circumference 52 inches  Waist to Hip Ratio 0.83 %   BMI (Calculated) 40.7   Triceps Skinfold 31 mm   % Body Fat 48 %   Grip Strength 47.86 kg   Flexibility 0 in   Single Leg Stand 2 seconds       Nutrition Therapy Plan and Nutrition Goals:   Nutrition Discharge: Rate Your Plate Scores:   Nutrition Goals Re-Evaluation:   Nutrition Goals Discharge (Final Nutrition Goals Re-Evaluation):   Psychosocial: Target Goals: Acknowledge presence or absence of significant depression and/or stress, maximize coping skills, provide positive support system. Participant is able to verbalize types and  ability to use techniques and skills needed for reducing stress and depression.  Initial Review & Psychosocial Screening:     Initial Psych Review & Screening - 04/17/17 1504      Initial Review   Current issues with Current Depression;History of Depression     Family Dynamics   Good Support System? Yes     Barriers   Psychosocial barriers to participate in program Psychosocial barriers identified (see note)  QOL overal score 16.69. Discussed counseling. Patient stated she did not feel she needed it at this time.      Screening Interventions   Interventions Encouraged to exercise      Quality of Life Scores:     Quality of Life - 04/17/17 1155      Quality of Life Scores   Health/Function Pre 14.91 %   Socioeconomic Pre 17.29 %   Psych/Spiritual Pre 19.86 %   Family Pre 17.1 %   GLOBAL Pre 16.69 %      PHQ-9: Recent Review Flowsheet Data    Depression screen [facility] 2/9 05/28/2017 04/17/2017 04/08/2017 03/03/2017 12/13/2016   Decreased Interest 0 0 2 1 0   Down, Depressed, Hopeless 2 - 2 1 0   PHQ - 2 Score 2 0 4 2 0   Altered sleeping '2 2 1 2 '$ -   Tired, decreased energy '2 2 1 2 '$ -   Change in appetite '1 1 1 2 '$ -   Feeling bad or failure about yourself  '1 1 1 2 '$ -   Trouble concentrating 0 0 1 3 -   Moving slowly or fidgety/restless 1 0 1 1 -   Suicidal thoughts 0 0 1 1 -   PHQ-9 Score '9 6 11 15 '$ -     Interpretation of Total Score  Total Score Depression Severity:  1-4 = Minimal depression, 5-9 = Mild depression, 10-14 = Moderate depression, 15-19 = Moderately severe depression, 20-27 = Severe depression   Psychosocial Evaluation and Intervention:     Psychosocial Evaluation - 04/17/17 1506      Psychosocial Evaluation & Interventions   Interventions Stress management education;Relaxation education;Encouraged to exercise with the program and follow exercise prescription   Comments Patient QOL overall scores where low 16.69. Discussed patient seeking counseling.  Patient stated she did not feel she needed it at this time.    Continue Psychosocial Services  Follow up required by staff      Psychosocial Re-Evaluation:     Psychosocial Re-Evaluation    Row Name 05/26/17 1332 06/19/17 0746 07/17/17 1341         Psychosocial Re-Evaluation   Current issues with Current Depression;History of Depression Current Depression Current Depression     Comments Patient's initial QOL score was 16.69 and her PHQ-9 score was 7. She is currently being treated with Celexa 40 mg. She is not interested in counseling but feels her  depression is managed on medication.  Patient's depression is managed with citalopram 40 mg daily.  Patient's depression continues to be managed with citalopram 40 mg daily.      Expected Outcomes Patient's QOL and PHQ-9 scores will improve or remain the same at discharge.  Patient's depression will be managed at discharge. Her PHQ-9 and QOL scores will improve or remain the same at discharge.  Patient will continue treatment for depression and have improved PHQ-9 and QOL scores at discharge.      Interventions Relaxation education;Stress management education;Encouraged to attend Pulmonary Rehabilitation for the exercise Encouraged to attend Pulmonary Rehabilitation for the exercise;Relaxation education;Stress management education Encouraged to attend Pulmonary Rehabilitation for the exercise;Relaxation education;Stress management education     Continue Psychosocial Services  Follow up required by staff Follow up required by staff Follow up required by staff        Psychosocial Discharge (Final Psychosocial Re-Evaluation):     Psychosocial Re-Evaluation - 07/17/17 1341      Psychosocial Re-Evaluation   Current issues with Current Depression   Comments Patient's depression continues to be managed with citalopram 40 mg daily.    Expected Outcomes Patient will continue treatment for depression and have improved PHQ-9 and QOL scores at discharge.     Interventions Encouraged to attend Pulmonary Rehabilitation for the exercise;Relaxation education;Stress management education   Continue Psychosocial Services  Follow up required by staff       Education: Education Goals: Education classes will be provided on a weekly basis, covering required topics. Participant will state understanding/return demonstration of topics presented.  Learning Barriers/Preferences:     Learning Barriers/Preferences - 04/17/17 1159      Learning Barriers/Preferences   Learning Barriers None   Learning Preferences Verbal Instruction;Individual Instruction      Education Topics: How Lungs Work and Diseases: - Discuss the anatomy of the lungs and diseases that can affect the lungs, such as COPD.   Exercise: -Discuss the importance of exercise, FITT principles of exercise, normal and abnormal responses to exercise, and how to exercise safely.   Environmental Irritants: -Discuss types of environmental irritants and how to limit exposure to environmental irritants.   PULMONARY REHAB OTHER RESPIRATORY from 07/03/2017 in [place]  Date  04/24/17  Educator  [facility]  Instruction Review Code  2- meets goals/outcomes      Meds/Inhalers and oxygen: - Discuss respiratory medications, definition of an inhaler and oxygen, and the proper way to use an inhaler and oxygen.   PULMONARY REHAB OTHER RESPIRATORY from 07/03/2017 in [place]  Date  05/01/17  Educator  [place]  Instruction Review Code  2- meets goals/outcomes      Energy Saving Techniques: - Discuss methods to conserve energy and decrease shortness of breath when performing activities of daily living.    PULMONARY REHAB OTHER RESPIRATORY from 07/03/2017 in [place]  Date  05/08/17  Educator  [place]  Instruction Review Code  2- meets goals/outcomes      Bronchial Hygiene / Breathing Techniques: - Discuss breathing mechanics,  pursed-lip breathing technique,  proper posture, effective ways to clear airways, and other functional breathing techniques   PULMONARY REHAB OTHER RESPIRATORY from 07/03/2017 in [place]  Date  05/15/17  Educator  [place]  Instruction Review Code  2- meets goals/outcomes      Cleaning Equipment: - Provides group verbal and written instruction about the health risks of elevated stress, cause of high stress, and healthy ways  to reduce stress.   PULMONARY REHAB OTHER RESPIRATORY from 07/03/2017 in [place]  Date  05/22/17  Educator  [place]  Instruction Review Code  2- meets goals/outcomes      Nutrition I: Fats: - Discuss the types of cholesterol, what cholesterol does to the body, and how cholesterol levels can be controlled.   Nutrition II: Labels: -Discuss the different components of food labels and how to read food labels.   Respiratory Infections: - Discuss the signs and symptoms of respiratory infections, ways to prevent respiratory infections, and the importance of seeking medical treatment when having a respiratory infection.   PULMONARY REHAB OTHER RESPIRATORY from 07/03/2017 in [place]  Date  06/12/17  Educator  [place]  Instruction Review Code  2- meets goals/outcomes      Stress I: Signs and Symptoms: - Discuss the causes of stress, how stress may lead to anxiety and depression, and ways to limit stress.   PULMONARY REHAB OTHER RESPIRATORY from 07/03/2017 in [place]  Date  06/19/17  Educator  [place]  Instruction Review Code  2- meets goals/outcomes      Stress II: Relaxation: -Discuss relaxation techniques to limit stress.   PULMONARY REHAB OTHER RESPIRATORY from 07/03/2017 in [place]  Date  06/26/17  Educator  [place]  Instruction Review Code  2- meets goals/outcomes      Oxygen for Home/Travel: - Discuss how to prepare for travel when on oxygen and  proper ways to transport and store oxygen to ensure safety.   PULMONARY REHAB OTHER RESPIRATORY from 07/03/2017 in [place]  Date  07/03/17  Educator  [place]  Instruction Review Code  2- meets goals/outcomes      Knowledge Questionnaire Score:     Knowledge Questionnaire Score - 04/17/17 1226      Knowledge Questionnaire Score   Pre Score 11/14      Core Components/Risk Factors/Patient Goals at Admission:     Personal Goals and Risk Factors at Admission - 04/17/17 1257      Core Components/Risk Factors/Patient Goals on Admission    Weight Management Weight Maintenance   Improve shortness of breath with ADL's Yes   Intervention Provide education, individualized exercise plan and daily activity instruction to help decrease symptoms of SOB with activities of daily living.   Expected Outcomes Short Term: Achieves a reduction of symptoms when performing activities of daily living.   Personal Goal Other Yes   Personal Goal Breathe better, Walk longer without SOB   Intervention Attend program 2 x week and supplement at home 3 x week.    Expected Outcomes Reach personal goals      Core Components/Risk Factors/Patient Goals Review:      Goals and Risk Factor Review    Row Name 05/26/17 1329 06/19/17 0742 07/17/17 1337         Core Components/Risk Factors/Patient Goals Review   Personal Goals Review Weight Management/Obesity;Improve shortness of breath with ADL's  Breathe better; walk longer.  Weight Management/Obesity;Improve shortness of breath with ADL's  Breathe better; walk longer. Weight Management/Obesity;Improve shortness of breath with ADL's;Develop more efficient breathing techniques such as purse lipped breathing and diaphragmatic breathing and practicing self-pacing with activity.  Breathe better; walk longer.      Review Patient has completed 11 sessions losing 5.3 lbs. She is progressing in the program. Her O2 usage remains the same. She reports  no improvement in her SOB but she does feel stronger  and better overall. Will continue to monitor for progress.  Patient has completed 16 sessions losing 3.3 lbs. She is progressing and doing well in the program saying her SOB has improved some. Will continue to monitor.  Patient has completed 21 sessions losing 7 lbs. She has done well in the program but has not been able to attend recently d/t a sprained ankle. Will continue to monitor.      Expected Outcomes Patient will complete the program and continue to work toward meeting her personal goals.  Patient will complete the program and meet her personal goals.  Patient will be able to return and complete the program meeting her personal goals.         Core Components/Risk Factors/Patient Goals at Discharge (Final Review):      Goals and Risk Factor Review - 07/17/17 1337      Core Components/Risk Factors/Patient Goals Review   Personal Goals Review Weight Management/Obesity;Improve shortness of breath with ADL's;Develop more efficient breathing techniques such as purse lipped breathing and diaphragmatic breathing and practicing self-pacing with activity.  Breathe better; walk longer.    Review Patient has completed 21 sessions losing 7 lbs. She has done well in the program but has not been able to attend recently d/t a sprained ankle. Will continue to monitor.    Expected Outcomes Patient will be able to return and complete the program meeting her personal goals.       ITP Comments:     ITP Comments    Row Name 04/30/17 1322 05/15/17 1247         ITP Comments Patient new to program completing 4 sessions. Will continue to monitor for progress.  Patient met with Registered Dietitian to discuss nutrition topics including: Heart healthty eating, heart healthy cooking and making smart choices when shopping; Portion control; weight management; and hydration. Patient attended a group session with the hospital chaplian called [facility] to  discuss and share how this recent diagnosis has effected their life         Comments: ITP 30 Day REVIEW Pt is making expected progress toward pulmonary rehab goals after completing 21 sessions. Recommend continued exercise, life style modification, education, and utilization of breathing techniques to increase stamina and strength and decrease shortness of breath with exertion.

## 2017-07-22 ENCOUNTER — Encounter (HOSPITAL_COMMUNITY)
Admission: RE | Admit: 2017-07-22 | Discharge: 2017-07-22 | Disposition: A | Discharge status: Home / Self Care | Payer: Medicare PPO | Source: Ambulatory Visit | Attending: Cardiology | Admitting: Cardiology

## 2017-07-22 DIAGNOSIS — I2721 Secondary pulmonary arterial hypertension: Secondary | ICD-10-CM | POA: Diagnosis not present

## 2017-07-22 NOTE — Progress Notes (Signed)
Daily Session Note  Patient Details  Name: Kelly Donaldson MRN: [id number] Date of Birth: 1956-05-13 Referring Provider:     PULMONARY REHAB OTHER RESP ORIENTATION from 04/17/2017 in [place]  Referring Provider  Dr. [name]      Encounter Date: 07/22/2017  Check In:     Session Check In - 07/22/17 1338      Check-In   Location [facility]   Staff Present [name], MS, EP, [facility], Exercise Physiologist;Rockwell Zentz Luther Parody, BS, EP, Exercise Physiologist   Supervising physician immediately available to respond to emergencies See telemetry face sheet for immediately available MD   Medication changes reported     No   Fall or balance concerns reported    No   Warm-up and Cool-down Performed as group-led instruction   Resistance Training Performed Yes   VAD Patient? No     Pain Assessment   Currently in Pain? No/denies   Pain Score 0-No pain   Multiple Pain Sites No      Capillary Blood Glucose: No results found for this or any previous visit (from the past 24 hour(s)).    History  Smoking Status  . Former Smoker  . Packs/day: 0.25  . Years: 17.00  . Types: Cigarettes  . Quit date: 03/02/2015  Smokeless Tobacco  . Never Used    Goals Met:  Independence with exercise equipment Improved SOB with ADL's Using PLB without cueing & demonstrates good technique Exercise tolerated well No report of cardiac concerns or symptoms Strength training completed today  Goals Unmet:  Not Applicable  Comments: Check out 230   Dr. Sinda Du is Medical Director for [facility].

## 2017-07-24 ENCOUNTER — Encounter (HOSPITAL_COMMUNITY)
Admission: RE | Admit: 2017-07-24 | Discharge: 2017-07-24 | Disposition: A | Discharge status: Home / Self Care | Payer: Medicare PPO | Source: Ambulatory Visit | Attending: Cardiology | Admitting: Cardiology

## 2017-07-24 DIAGNOSIS — I2721 Secondary pulmonary arterial hypertension: Secondary | ICD-10-CM

## 2017-07-24 NOTE — Progress Notes (Signed)
Daily Session Note  Patient Details  Name: Kelly Donaldson MRN: [id number] Date of Birth: 10/06/1956 Referring Provider:     PULMONARY REHAB OTHER RESP ORIENTATION from 04/17/2017 in [place] [facility]  Referring Provider  Dr. Mclean      Encounter Date: 07/24/2017  Check In:     Session Check In - 07/24/17 1353      Check-In   Location [facility]   Staff Present Debra Johnson, RN, BSN;Uchenna Seufert, BS, EP, Exercise Physiologist   Supervising physician immediately available to respond to emergencies See telemetry face sheet for immediately available MD   Medication changes reported     No   Fall or balance concerns reported    No   Warm-up and Cool-down Performed as group-led instruction   Resistance Training Performed Yes   VAD Patient? No     Pain Assessment   Currently in Pain? No/denies   Pain Score 0-No pain   Multiple Pain Sites No      Capillary Blood Glucose: No results found for this or any previous visit (from the past 24 hour(s)).      Exercise Prescription Changes - 07/23/17 1400      Response to Exercise   Blood Pressure (Admit) 100/58   Blood Pressure (Exercise) 112/62   Blood Pressure (Exit) 98/60   Heart Rate (Admit) 67 bpm   Heart Rate (Exercise) 80 bpm   Heart Rate (Exit) 65 bpm   Oxygen Saturation (Admit) 92 %   Oxygen Saturation (Exercise) 90 %   Oxygen Saturation (Exit) 94 %   Rating of Perceived Exertion (Exercise) 11   Perceived Dyspnea (Exercise) 11   Duration Progress to 30 minutes of  aerobic without signs/symptoms of physical distress   Intensity THRR unchanged     Progression   Progression Continue to progress workloads to maintain intensity without signs/symptoms of physical distress.     Resistance Training   Training Prescription Yes   Weight 0   Reps 10-15     Oxygen   Oxygen Continuous   Liters 6     NuStep   Level 3   SPM 90   Minutes 34   METs 2.1     Home Exercise Plan   Plans  to continue exercise at Home (comment)   Frequency Add 2 additional days to program exercise sessions.      History  Smoking Status  . Former Smoker  . Packs/day: 0.25  . Years: 17.00  . Types: Cigarettes  . Quit date: 03/02/2015  Smokeless Tobacco  . Never Used    Goals Met:  Independence with exercise equipment Improved SOB with ADL's Using PLB without cueing & demonstrates good technique Exercise tolerated well No report of cardiac concerns or symptoms Strength training completed today  Goals Unmet:  Not Applicable  Comments: Check out 230   Dr. Edward Hawkins is Medical Director for [place] [facility]. 

## 2017-07-29 ENCOUNTER — Encounter (HOSPITAL_COMMUNITY)
Admission: RE | Admit: 2017-07-29 | Discharge: 2017-07-29 | Disposition: A | Discharge status: Home / Self Care | Payer: Medicare PPO | Source: Ambulatory Visit | Attending: Cardiology | Admitting: Cardiology

## 2017-07-29 DIAGNOSIS — I2721 Secondary pulmonary arterial hypertension: Secondary | ICD-10-CM | POA: Diagnosis not present

## 2017-07-29 NOTE — Progress Notes (Signed)
Daily Session Note  Patient Details  Name: Kelly Donaldson MRN: [id number] Date of Birth: Jun 30, 1956 Referring Provider:     PULMONARY REHAB OTHER RESP ORIENTATION from 04/17/2017 in [place]  Referring Provider  Dr. [name]      Encounter Date: 07/29/2017  Check In:     Session Check In - 07/29/17 1327      Check-In   Location [facility]   Staff Present Suzanne Boron, BS, EP, Exercise Physiologist;Other   Supervising physician immediately available to respond to emergencies See telemetry face sheet for immediately available MD   Medication changes reported     No   Fall or balance concerns reported    No   Warm-up and Cool-down Performed as group-led instruction   Resistance Training Performed Yes   VAD Patient? No     Pain Assessment   Currently in Pain? No/denies   Pain Score 0-No pain   Multiple Pain Sites No      Capillary Blood Glucose: No results found for this or any previous visit (from the past 24 hour(s)).    History  Smoking Status  . Former Smoker  . Packs/day: 0.25  . Years: 17.00  . Types: Cigarettes  . Quit date: 03/02/2015  Smokeless Tobacco  . Never Used    Goals Met:  Independence with exercise equipment Improved SOB with ADL's Using PLB without cueing & demonstrates good technique Exercise tolerated well No report of cardiac concerns or symptoms Strength training completed today  Goals Unmet:  Not Applicable  Comments: Check out 230   Dr. Sinda Du is Medical Director for [facility].

## 2017-07-31 ENCOUNTER — Encounter (HOSPITAL_COMMUNITY): Payer: Medicare PPO

## 2017-08-01 ENCOUNTER — Other Ambulatory Visit (HOSPITAL_COMMUNITY): Payer: Self-pay | Admitting: Cardiology

## 2017-08-01 MED ORDER — RIOCIGUAT 2 MG PO TABS: 2.0000 mg | ORAL_TABLET | Freq: Three times a day (TID) | ORAL | 11 refills | Status: DC

## 2017-08-05 ENCOUNTER — Encounter (HOSPITAL_COMMUNITY)
Admission: RE | Admit: 2017-08-05 | Discharge: 2017-08-05 | Disposition: A | Discharge status: Home / Self Care | Payer: Medicare PPO | Source: Ambulatory Visit | Attending: Cardiology | Admitting: Cardiology

## 2017-08-05 DIAGNOSIS — I2721 Secondary pulmonary arterial hypertension: Secondary | ICD-10-CM | POA: Diagnosis not present

## 2017-08-05 NOTE — Progress Notes (Signed)
Daily Session Note  Patient Details  Name: Kelly Donaldson MRN: [id number] Date of Birth: [birth year] Referring Provider:     PULMONARY REHAB OTHER RESP ORIENTATION from 04/17/2017 in [place]  Referring Provider  Dr. [name]      Encounter Date: 08/05/2017  Check In:     Session Check In - 08/05/17 1325      Check-In   Location [facility]   Staff Present [name], MS, EP, [facility], Exercise Physiologist;Solace Manwarren Luther Parody, BS, EP, Exercise Physiologist   Supervising physician immediately available to respond to emergencies See telemetry face sheet for immediately available MD   Medication changes reported     No   Fall or balance concerns reported    No   Warm-up and Cool-down Performed as group-led instruction   Resistance Training Performed Yes   VAD Patient? No     Pain Assessment   Currently in Pain? No/denies   Pain Score 0-No pain   Multiple Pain Sites No      Capillary Blood Glucose: No results found for this or any previous visit (from the past 24 hour(s)).    History  Smoking Status  . Former Smoker  . Packs/day: 0.25  . Years: 17.00  . Types: Cigarettes  . Quit date: 03/02/2015  Smokeless Tobacco  . Never Used    Goals Met:  Independence with exercise equipment Improved SOB with ADL's Using PLB without cueing & demonstrates good technique Exercise tolerated well No report of cardiac concerns or symptoms Strength training completed today  Goals Unmet:  Not Applicable  Comments: Check out 230   Dr. Sinda Du is Medical Director for [facility].

## 2017-08-07 ENCOUNTER — Encounter (HOSPITAL_COMMUNITY)
Admission: RE | Admit: 2017-08-07 | Discharge: 2017-08-07 | Disposition: A | Discharge status: Home / Self Care | Payer: Medicare PPO | Source: Ambulatory Visit | Attending: Cardiology | Admitting: Cardiology

## 2017-08-07 DIAGNOSIS — I2721 Secondary pulmonary arterial hypertension: Secondary | ICD-10-CM

## 2017-08-07 NOTE — Progress Notes (Signed)
Daily Session Note  Patient Details  Name: Kelly Donaldson MRN: [id number] Date of Birth: Jul 10, 1956 Referring Provider:     PULMONARY REHAB OTHER RESP ORIENTATION from 04/17/2017 in [place]  Referring Provider  Dr. [name]      Encounter Date: 08/07/2017  Check In:     Session Check In - 08/07/17 1323      Check-In   Location [facility]   Staff Present [name], RN, BSN;Rilynne Lonsway Luther Parody, BS, EP, Exercise Physiologist   Supervising physician immediately available to respond to emergencies See telemetry face sheet for immediately available MD   Medication changes reported     No   Fall or balance concerns reported    No   Warm-up and Cool-down Performed as group-led instruction   Resistance Training Performed Yes   VAD Patient? No     Pain Assessment   Currently in Pain? No/denies   Pain Score 0-No pain   Multiple Pain Sites No      Capillary Blood Glucose: No results found for this or any previous visit (from the past 24 hour(s)).      Exercise Prescription Changes - 08/06/17 1400      Response to Exercise   Blood Pressure (Admit) 110/66   Blood Pressure (Exercise) 120/68   Blood Pressure (Exit) 110/64   Heart Rate (Admit) 71 bpm   Heart Rate (Exercise) 81 bpm   Heart Rate (Exit) 63 bpm   Oxygen Saturation (Admit) 100 %   Oxygen Saturation (Exercise) 89 %   Oxygen Saturation (Exit) 98 %   Rating of Perceived Exertion (Exercise) 11   Perceived Dyspnea (Exercise) 11   Duration Progress to 30 minutes of  aerobic without signs/symptoms of physical distress   Intensity THRR unchanged     Progression   Progression Continue to progress workloads to maintain intensity without signs/symptoms of physical distress.     Resistance Training   Training Prescription Yes   Weight 0   Reps 10-15     Oxygen   Oxygen Continuous   Liters 6     NuStep   Level 3   SPM 95   Minutes 34   METs 2.1     Home Exercise Plan   Plans to continue exercise at Home (comment)   Frequency Add 2 additional days to program exercise sessions.      History  Smoking Status  . Former Smoker  . Packs/day: 0.25  . Years: 17.00  . Types: Cigarettes  . Quit date: 03/02/2015  Smokeless Tobacco  . Never Used    Goals Met:  Independence with exercise equipment Improved SOB with ADL's Using PLB without cueing & demonstrates good technique Exercise tolerated well No report of cardiac concerns or symptoms Strength training completed today  Goals Unmet:  Not Applicable  Comments: Check out 230   Dr. Sinda Du is Medical Director for [facility].

## 2017-08-12 ENCOUNTER — Encounter (HOSPITAL_COMMUNITY)
Admission: RE | Admit: 2017-08-12 | Discharge: 2017-08-12 | Disposition: A | Discharge status: Home / Self Care | Payer: Medicare PPO | Source: Ambulatory Visit | Attending: Cardiology | Admitting: Cardiology

## 2017-08-12 DIAGNOSIS — I2721 Secondary pulmonary arterial hypertension: Secondary | ICD-10-CM

## 2017-08-12 NOTE — Progress Notes (Signed)
Daily Session Note  Patient Details  Name: Kelly Donaldson MRN: [id number] Date of Birth: [birth year] Referring Provider:     PULMONARY REHAB OTHER RESP ORIENTATION from 04/17/2017 in [place]  Referring Provider  Dr. [name]      Encounter Date: 08/12/2017  Check In:     Session Check In - 08/12/17 1337      Check-In   Location [facility]   Staff Present Russella Dar, MS, EP, [facility], Exercise Physiologist;Adrinne Sze Luther Parody, BS, EP, Exercise Physiologist   Supervising physician immediately available to respond to emergencies See telemetry face sheet for immediately available MD   Medication changes reported     No   Fall or balance concerns reported    No   Warm-up and Cool-down Performed as group-led instruction   Resistance Training Performed Yes   VAD Patient? No     Pain Assessment   Currently in Pain? No/denies   Pain Score 0-No pain   Multiple Pain Sites No      Capillary Blood Glucose: No results found for this or any previous visit (from the past 24 hour(s)).    History  Smoking Status  . Former Smoker  . Packs/day: 0.25  . Years: 17.00  . Types: Cigarettes  . Quit date: 03/02/2015  Smokeless Tobacco  . Never Used    Goals Met:  Independence with exercise equipment Improved SOB with ADL's Using PLB without cueing & demonstrates good technique Exercise tolerated well No report of cardiac concerns or symptoms Strength training completed today  Goals Unmet:  Not Applicable  Comments: Check out 230   Dr. Sinda Du is Medical Director for [facility].

## 2017-08-14 ENCOUNTER — Encounter (HOSPITAL_COMMUNITY): Payer: Medicare PPO

## 2017-08-14 NOTE — Progress Notes (Signed)
Pulmonary Individual Treatment Plan  Patient Details  Name: Kelly Donaldson MRN: [id number] Date of Birth: February 23, 1956 Referring Provider:     PULMONARY REHAB OTHER RESP ORIENTATION from 04/17/2017 in [place]  Referring Provider  Dr. [name]      Initial Encounter Date:    [facility] from 04/17/2017 in [place]  Date  04/17/17  Referring Provider  Dr. [name]      Visit Diagnosis: PAH (pulmonary artery hypertension) ([place])  Patient's Home Medications on Admission:   Current Outpatient Prescriptions:  .  aspirin EC 81 MG tablet, Take 1 tablet (81 mg total) by mouth daily., Disp: 90 tablet, Rfl: 3 .  cefdinir (OMNICEF) 300 MG capsule, Take 1 capsule (300 mg total) by mouth 2 (two) times daily. 1 po BID (Patient not taking: Reported on 06/09/2017), Disp: 20 capsule, Rfl: 0 .  cetirizine (ZYRTEC) 10 MG tablet, Take 1 tablet (10 mg total) by mouth daily. (Patient taking differently: Take 10 mg by mouth at bedtime. ), Disp: 90 tablet, Rfl: 3 .  citalopram (CELEXA) 40 MG tablet, TAKE ONE TABLET BY MOUTH ONCE DAILY FOR 30 DAYS, Disp: 30 tablet, Rfl: 2 .  HYDROcodone-acetaminophen (NORCO) 10-325 MG tablet, Take 1-2 tablets by mouth every 6 (six) hours as needed., Disp: 240 tablet, Rfl: 0 .  HYDROcodone-homatropine (HYCODAN) 5-1.5 MG/5ML syrup, Take 5-10 mLs by mouth every 6 (six) hours as needed. (Patient not taking: Reported on 06/09/2017), Disp: 240 mL, Rfl: 0 .  Ipratropium-Albuterol (COMBIVENT RESPIMAT) 20-100 MCG/ACT AERS respimat, Inhale 1 puff into the lungs every 6 (six) hours as needed for wheezing., Disp: 3 Inhaler, Rfl: 3 .  ipratropium-albuterol (DUONEB) 0.5-2.5 (3) MG/3ML SOLN, Take 3 mLs by nebulization 4 (four) times daily as needed. (Patient taking differently: Take 3 mLs by nebulization 4 (four) times daily as needed (for wheezing/shortness of breath). ), Disp: 360 mL, Rfl: 11 .  Macitentan (OPSUMIT) 10 MG TABS,  Take 1 tablet (10 mg total) by mouth daily., Disp: 30 tablet, Rfl: 11 .  meloxicam (MOBIC) 15 MG tablet, Take 1 tablet (15 mg total) by mouth daily. (Patient not taking: Reported on 06/09/2017), Disp: 30 tablet, Rfl: 11 .  meloxicam (MOBIC) 7.5 MG tablet, Take 7.5 mg by mouth at bedtime., Disp: , Rfl:  .  OXYGEN, 2.5 lpm with sleep and 4 lpm with exertion  [facility], Disp: , Rfl:  .  potassium chloride SA (K-DUR,KLOR-CON) 20 MEQ tablet, Take 1 tablet (20 mEq total) by mouth 2 (two) times daily., Disp: 180 tablet, Rfl: 3 .  pravastatin (PRAVACHOL) 20 MG tablet, Take 1 tablet (20 mg total) by mouth daily. (Patient taking differently: Take 20 mg by mouth at bedtime. ), Disp: 90 tablet, Rfl: 3 .  Riociguat (ADEMPAS) 2 MG TABS, Take 2 mg by mouth 3 (three) times daily., Disp: 90 tablet, Rfl: 11 .  Selexipag 200 MCG TABS, Take 1 tablet (200 mcg total) by mouth 2 (two) times daily., Disp: 60 tablet, Rfl: 6 .  torsemide (DEMADEX) 20 MG tablet, Take 2 tablets (40 mg total) by mouth daily., Disp: 180 tablet, Rfl: 3 .  trimethoprim-polymyxin b (POLYTRIM) ophthalmic solution, Place 1 drop into both eyes 3 (three) times daily., Disp: , Rfl:  .  Vitamin D, Ergocalciferol, (DRISDOL) 50000 units CAPS capsule, TAKE ONE CAPSULE BY MOUTH ONCE WEEKLY FOR 30 DAYS (Patient taking differently: Take 50,000 Units by mouth every Monday. ), Disp: 12 capsule, Rfl: 3 .  zolpidem (AMBIEN) 5  MG tablet, TAKE ONE TABLET BY MOUTH AT BEDTIME, Disp: 30 tablet, Rfl: 2  Past Medical History: Past Medical History:  Diagnosis Date  . Allergy   . Depression   . GERD (gastroesophageal reflux disease)   . Hyperlipidemia   . Oxygen dependent   . Shortness of breath dyspnea   . Varicose veins     Tobacco Use: History  Smoking Status  . Former Smoker  . Packs/day: 0.25  . Years: 17.00  . Types: Cigarettes  . Quit date: 03/02/2015  Smokeless Tobacco  . Never Used    Labs: Recent Review Flowsheet Data    Labs for ITP Cardiac  and Pulmonary Rehab Latest Ref Rng & Units 06/13/2017 06/13/2017   HCO3 20.0 - 28.0 mmol/L 27.9 28.6(H)   TCO2 0 - 100 mmol/L 29 30   O2SAT % 75.0 73.0      Capillary Blood Glucose: No results found for: GLUCAP   Pulmonary Assessment Scores:     Pulmonary Assessment Scores    Row Name 04/17/17 1229         ADL UCSD   ADL Phase Entry     SOB Score total 67     Rest 0     Walk 8     Stairs 5     Bath 0     Dress 2     Shop 3       CAT Score   CAT Score 16       mMRC Score   mMRC Score 4        Pulmonary Function Assessment:     Pulmonary Function Assessment - 04/17/17 1226      Pulmonary Function Tests   FVC% 76 %   FEV1% 75 %   FEV1/FVC Ratio 97   DLCO% 26 %     Initial Spirometry Results   FVC% 76 %   FEV1% 74 %   FEV1/FVC Ratio 96     Post Bronchodilator Spirometry Results   FVC% 76 %   FEV1% 74 %   FEV1/FVC Ratio 96     Breath   Bilateral Breath Sounds Clear   Shortness of Breath Yes      Exercise Target Goals:    Exercise Program Goal: Individual exercise prescription set with THRR, safety & activity barriers. Participant demonstrates ability to understand and report RPE using BORG scale, to self-measure pulse accurately, and to acknowledge the importance of the exercise prescription.  Exercise Prescription Goal: Starting with aerobic activity 30 plus minutes a day, 3 days per week for initial exercise prescription. Provide home exercise prescription and guidelines that participant acknowledges understanding prior to discharge.  Activity Barriers & Risk Stratification:   6 Minute Walk:     6 Minute Walk    Row Name 04/17/17 1152         6 Minute Walk   Phase Initial     Distance 750 feet     Distance % Change 0 %     Walk Time 6 minutes     # of Rest Breaks 1     MPH 1.42     METS 2.08     RPE 15     Perceived Dyspnea  16     VO2 Peak 7.44     Symptoms No     Resting HR 74 bpm     Resting BP 94/50     Max Ex. HR 143 bpm      Max Ex. BP  134/62     2 Minute Post BP 106/56        Oxygen Initial Assessment:     Oxygen Initial Assessment - 04/17/17 1235      Home Oxygen   Home Oxygen Device Home Concentrator;Portable Concentrator   Sleep Oxygen Prescription None   Home Exercise Oxygen Prescription Continuous   Liters per minute 4   Home at Rest Exercise Oxygen Prescription Continuous   Liters per minute 2   Compliance with Home Oxygen Use Yes     Initial 6 min Walk   Oxygen Used Continuous   Liters per minute 4   Resting Oxygen Saturation  94 %   Exercise Oxygen Saturation  during 6 min walk 72 %  Had patient stop to rest, increase oxygen to 6 liters, SaO2 increased to 90 mg/dl. then test was resumed and patient finished the walk test.      Program Oxygen Prescription   Program Oxygen Prescription Continuous     Intervention   Short Term Goals To learn and demonstrate proper purse lipped breathing techniques or other breathing techniques.;To learn and exhibit compliance with exercise, home and travel O2 prescription;To Learn and understand importance of maintaining oxygen saturations>88%   Long  Term Goals Maintenance of O2 saturations>88%      Oxygen Re-Evaluation:     Oxygen Re-Evaluation    Row Name 05/26/17 1327 06/19/17 0748 07/17/17 1329 08/14/17 1541       Program Oxygen Prescription   Program Oxygen Prescription Continuous Continuous Continuous Continuous    Liters per minute '2 2 3 3    '$ FiO2% 91 92 91 91    Comments  -  - Patient uses 3-5 l/min during exercise session depending on her SOB and her O2 Sat levels.   -      Home Oxygen   Home Oxygen Device Home Concentrator Home Concentrator Home Concentrator;Portable Concentrator Home Concentrator;Portable Concentrator    Sleep Oxygen Prescription Continuous Continuous Continuous Continuous    Liters per minute '2 2 2 2    '$ Home Exercise Oxygen Prescription Continuous Continuous Continuous Continuous    Liters per minute '2 2 3 2     '$ Home at Rest Exercise Oxygen Prescription Continuous Continuous Continuous Continuous    Liters per minute '2 2 2 2    '$ Compliance with Home Oxygen Use Yes Yes Yes Yes      Goals/Expected Outcomes   Short Term Goals To Learn and understand importance of maintaining oxygen saturations>88% To learn and exhibit compliance with exercise, home and travel O2 prescription To learn and exhibit compliance with exercise, home and travel O2 prescription;To Learn and understand importance of maintaining oxygen saturations>88%;To learn and understand importance of monitoring SPO2 with pulse oximeter and demonstrate accurate use of the pulse oximeter.;To learn and demonstrate proper purse lipped breathing techniques or other breathing techniques. To learn and exhibit compliance with exercise, home and travel O2 prescription;To learn and understand importance of monitoring SPO2 with pulse oximeter and demonstrate accurate use of the pulse oximeter.;To learn and understand importance of maintaining oxygen saturations>88%;To learn and demonstrate proper pursed lip breathing techniques or other breathing techniques.    Long  Term Goals Maintenance of O2 saturations>88% Exhibits compliance with exercise, home and travel O2 prescription Verbalizes importance of monitoring SPO2 with pulse oximeter and return demonstration;Maintenance of O2 saturations>88%;Exhibits proper breathing techniques, such as purse lipped breathing or other method taught during program session Exhibits compliance with exercise, home and travel O2 prescription;Verbalizes importance of monitoring  SPO2 with pulse oximeter and return demonstration;Maintenance of O2 saturations>88%;Exhibits proper breathing techniques, such as pursed lip breathing or other method taught during program session    Comments Patient is compliant with her O2 prescription. Her O2 saturation is 91-97 at PR. Her O2 runs at 2 L/min.   - Patient compliant with O2 prescription.  Patient contiunes to be compliant with O2 prescription.    Goals/Expected Outcomes Patient will continue to be compliant with her O2 prescription and continue to maintain her O2 saturation levels at 91-97.  Patient will continue to be compliant with her O2 prescription. Patient will continue to be compliant with O2 prescription. Patient is compliant with O2 prescription.       Oxygen Discharge (Final Oxygen Re-Evaluation):     Oxygen Re-Evaluation - 08/14/17 1541      Program Oxygen Prescription   Program Oxygen Prescription Continuous   Liters per minute 3   FiO2% 91     Home Oxygen   Home Oxygen Device Home Concentrator;Portable Concentrator   Sleep Oxygen Prescription Continuous   Liters per minute 2   Home Exercise Oxygen Prescription Continuous   Liters per minute 2   Home at Rest Exercise Oxygen Prescription Continuous   Liters per minute 2   Compliance with Home Oxygen Use Yes     Goals/Expected Outcomes   Short Term Goals To learn and exhibit compliance with exercise, home and travel O2 prescription;To learn and understand importance of monitoring SPO2 with pulse oximeter and demonstrate accurate use of the pulse oximeter.;To learn and understand importance of maintaining oxygen saturations>88%;To learn and demonstrate proper pursed lip breathing techniques or other breathing techniques.   Long  Term Goals Exhibits compliance with exercise, home and travel O2 prescription;Verbalizes importance of monitoring SPO2 with pulse oximeter and return demonstration;Maintenance of O2 saturations>88%;Exhibits proper breathing techniques, such as pursed lip breathing or other method taught during program session   Comments Patient contiunes to be compliant with O2 prescription.   Goals/Expected Outcomes Patient is compliant with O2 prescription.      Initial Exercise Prescription:     Initial Exercise Prescription - 04/17/17 1100      Date of Initial Exercise RX and Referring  Provider   Date 04/17/17   Referring Provider [name]     Oxygen   Oxygen Continuous   Liters 6     Treadmill   MPH 1.4   Grade 0   Minutes 15   METs 2     NuStep   Level 2   SPM 15   Minutes 20   METs 1.8     Prescription Details   Frequency (times per week) 2   Duration Progress to 30 minutes of continuous aerobic without signs/symptoms of physical distress     Intensity   THRR 40-80% of Max Heartrate [phone]   Ratings of Perceived Exertion 11-13   Perceived Dyspnea 0-4     Progression   Progression Continue progressive overload as per policy without signs/symptoms or physical distress.     Resistance Training   Training Prescription Yes   Weight 1   Reps 10-15      Perform Capillary Blood Glucose checks as needed.  Exercise Prescription Changes:      Exercise Prescription Changes    Row Name 04/28/17 1400 05/13/17 1500 05/22/17 0800 06/12/17 0700 07/02/17 1400     Response to Exercise   Blood Pressure (Admit) 122/80 116/58 100/56 106/56 150/62   Blood Pressure (Exercise) 136/82 138/72 118/68  124/66 100/60   Blood Pressure (Exit) 92/60 124/68 104/66 104/60 98/58   Heart Rate (Admit) 71 bpm 72 bpm 76 bpm 65 bpm 74 bpm   Heart Rate (Exercise) 92 bpm 76 bpm 86 bpm 111 bpm 70 bpm   Heart Rate (Exit) 72 bpm 93 bpm 72 bpm 77 bpm 65 bpm   Oxygen Saturation (Admit) 92 % 94 % 90 % 93 % 93 %   Oxygen Saturation (Exercise) 90 % 90 % 95 % 90 % 92 %   Oxygen Saturation (Exit) 97 % 94 % 94 % 94 % 94 %   Rating of Perceived Exertion (Exercise) '13 12 12 13 15   '$ Perceived Dyspnea (Exercise) '13 12 12 12 15   '$ Duration Progress to 30 minutes of  aerobic without signs/symptoms of physical distress Progress to 30 minutes of  aerobic without signs/symptoms of physical distress Progress to 30 minutes of  aerobic without signs/symptoms of physical distress Progress to 30 minutes of  aerobic without signs/symptoms of physical distress Progress to 30 minutes of  aerobic  without signs/symptoms of physical distress   Intensity THRR unchanged THRR unchanged THRR unchanged THRR unchanged THRR unchanged     Progression   Progression Continue to progress workloads to maintain intensity without signs/symptoms of physical distress. Continue to progress workloads to maintain intensity without signs/symptoms of physical distress. Continue to progress workloads to maintain intensity without signs/symptoms of physical distress. Continue to progress workloads to maintain intensity without signs/symptoms of physical distress. Continue to progress workloads to maintain intensity without signs/symptoms of physical distress.     Resistance Training   Training Prescription Yes Yes Yes Yes Yes   Weight 1 1 0 0 0   Reps 10-15 10-15 10-15 10-15 10-15     Oxygen   Oxygen Continuous Continuous Continuous Continuous Continuous   Liters '6 6 6 6 6     '$ Treadmill   MPH 1.4 1.4 1.5 1.6 1.6   Grade 0 0 0 0 0   Minutes '15 15 15 15 15   '$ METs 2 2 2.15 2.2 2.2     NuStep   Level '2 3 3 3 1   '$ SPM '16 17 25 20 19   '$ Minutes '20 20 20 20 20   '$ METs 3.73 3.74 3.73 2.8 1.9     Home Exercise Plan   Plans to continue exercise at Home (comment) Home (comment) Home (comment) Home (comment) Home (comment)   Frequency Add 2 additional days to program exercise sessions. Add 2 additional days to program exercise sessions. Add 2 additional days to program exercise sessions. Add 2 additional days to program exercise sessions. Add 2 additional days to program exercise sessions.   [place] Name 07/23/17 1400 08/06/17 1400           Response to Exercise   Blood Pressure (Admit) 100/58 110/66      Blood Pressure (Exercise) 112/62 120/68      Blood Pressure (Exit) 98/60 110/64      Heart Rate (Admit) 67 bpm 71 bpm      Heart Rate (Exercise) 80 bpm 81 bpm      Heart Rate (Exit) 65 bpm 63 bpm      Oxygen Saturation (Admit) 92 % 100 %      Oxygen Saturation (Exercise) 90 % 89 %      Oxygen Saturation  (Exit) 94 % 98 %      Rating of Perceived Exertion (Exercise) 11 11  Perceived Dyspnea (Exercise) 11 11      Duration Progress to 30 minutes of  aerobic without signs/symptoms of physical distress Progress to 30 minutes of  aerobic without signs/symptoms of physical distress      Intensity THRR unchanged THRR unchanged        Progression   Progression Continue to progress workloads to maintain intensity without signs/symptoms of physical distress. Continue to progress workloads to maintain intensity without signs/symptoms of physical distress.        Resistance Training   Training Prescription Yes Yes      Weight 0 0      Reps 10-15 10-15        Oxygen   Oxygen Continuous Continuous      Liters 6 6        NuStep   Level 3 3      SPM 90 95      Minutes 34 34      METs 2.1 2.1        Home Exercise Plan   Plans to continue exercise at Home (comment) Home (comment)      Frequency Add 2 additional days to program exercise sessions. Add 2 additional days to program exercise sessions.         Exercise Comments:      Exercise Comments    Row Name 04/28/17 1449 05/13/17 1532 05/22/17 0819 06/12/17 0739 07/02/17 1416   Exercise Comments Patient has just recently started PR and will be progressed in time.  Patient is doing well in PR. Patient is doing well in PR.  Patient is doing well in PR.  Patient has not been progressed due to health concerns   Row Name 07/23/17 1412 08/06/17 1438         Exercise Comments Patient has progressed on the Nustep but has been take off of teh treadmill for now due to a sprained foot/ankle.  Patient is doing well in Pr. she is maintaining her levels on the Nustep but has not increased in difficulty due to a foot injury.          Exercise Goals and Review:      Exercise Goals    Row Name 04/17/17 1256             Exercise Goals   Increase Physical Activity Yes       Intervention Provide advice, education, support and counseling about  physical activity/exercise needs.;Develop an individualized exercise prescription for aerobic and resistive training based on initial evaluation findings, risk stratification, comorbidities and participant's personal goals.       Expected Outcomes Achievement of increased cardiorespiratory fitness and enhanced flexibility, muscular endurance and strength shown through measurements of functional capacity and personal statement of participant.       Increase Strength and Stamina Yes       Intervention Provide advice, education, support and counseling about physical activity/exercise needs.;Develop an individualized exercise prescription for aerobic and resistive training based on initial evaluation findings, risk stratification, comorbidities and participant's personal goals.       Expected Outcomes Achievement of increased cardiorespiratory fitness and enhanced flexibility, muscular endurance and strength shown through measurements of functional capacity and personal statement of participant.          Exercise Goals Re-Evaluation :     Exercise Goals Re-Evaluation    Row Name 05/26/17 1337 06/19/17 0744 07/17/17 1339 08/14/17 1546       Exercise Goal Re-Evaluation   Exercise Goals Review Increase  Physical Activity;Increase Strenth and Stamina Increase Physical Activity;Increase Strenth and Stamina Increase Physical Activity;Increase Strenth and Stamina Increase Physical Activity;Increase Strength and Stamina    Comments After completing 11 sessions, patient is progressing with some increased strength and stamina. She says she has not increased her activity but feels the program is benefiting her.  Patient has completed 16 sessions with progression in her treadmill speed and nustep level. She says she is getting stronger and the program is helping her meet her goals. Will continue to monitor.  Patient has completed 21 sessions. She has not been progressed some with increased treadmill speed since last  30 day review. She has had some issues with hypotension and now has a sprained ankle that has prevented her attending. Will continue to monitor.  Patient contiunes to do well in the program. She says she is getting stronger with more endurance. Will continue to monitor.    Expected Outcomes Patient will complete the program continued increased strength, stamina, and activity.  Patient will complete the program with increased strength, stamina, and activity. Patient will be able to return to the program and complete with increased strength, stamina, and activity. Patient will complete the program and continue to meet her exercise goals.        Discharge Exercise Prescription (Final Exercise Prescription Changes):     Exercise Prescription Changes - 08/06/17 1400      Response to Exercise   Blood Pressure (Admit) 110/66   Blood Pressure (Exercise) 120/68   Blood Pressure (Exit) 110/64   Heart Rate (Admit) 71 bpm   Heart Rate (Exercise) 81 bpm   Heart Rate (Exit) 63 bpm   Oxygen Saturation (Admit) 100 %   Oxygen Saturation (Exercise) 89 %   Oxygen Saturation (Exit) 98 %   Rating of Perceived Exertion (Exercise) 11   Perceived Dyspnea (Exercise) 11   Duration Progress to 30 minutes of  aerobic without signs/symptoms of physical distress   Intensity THRR unchanged     Progression   Progression Continue to progress workloads to maintain intensity without signs/symptoms of physical distress.     Resistance Training   Training Prescription Yes   Weight 0   Reps 10-15     Oxygen   Oxygen Continuous   Liters 6     NuStep   Level 3   SPM 95   Minutes 34   METs 2.1     Home Exercise Plan   Plans to continue exercise at Home (comment)   Frequency Add 2 additional days to program exercise sessions.      Nutrition:  Target Goals: Understanding of nutrition guidelines, daily intake of sodium '1500mg'$ , cholesterol '200mg'$ , calories 30% from fat and 7% or less from saturated fats, daily  to have 5 or more servings of fruits and vegetables.  Biometrics:     Pre Biometrics - 04/17/17 1154      Pre Biometrics   Height '5\' 7"'$  (1.702 m)   Weight 259 lb 0.7 oz (117.5 kg)   Waist Circumference 43 inches   Hip Circumference 52 inches   Waist to Hip Ratio 0.83 %   BMI (Calculated) 40.7   Triceps Skinfold 31 mm   % Body Fat 48 %   Grip Strength 47.86 kg   Flexibility 0 in   Single Leg Stand 2 seconds       Nutrition Therapy Plan and Nutrition Goals:     Nutrition Therapy & Goals - 08/14/17 1539      Personal  Nutrition Goals   Nutrition Goal For heart healthy choices add >50% of whole grains, make half their plate fruits and vegetables. Discuss the difference between starchy vegetables and leafy greens, and how leafy vegetables provide fiber, helps maintain healthy weight, helps control blood glucose, and lowers cholesterol.  Discuss purchasing fresh or frozen vegetable to reduce sodium and not to add grease, fat or sugar. Consume <18oz of red meat per week. Consume lean cuts of meats and very little of meats high in sodium and nitrates such as pork and lunch meats. Discussed portion control for all food groups.    Additional Goals? No     Intervention Plan   Intervention Prescribe, educate and counsel regarding individualized specific dietary modifications aiming towards targeted core components such as weight, hypertension, lipid management, diabetes, heart failure and other comorbidities.;Nutrition handout(s) given to patient.   Expected Outcomes Short Term Goal: Understand basic principles of dietary content, such as calories, fat, sodium, cholesterol and nutrients.;Short Term Goal: A plan has been developed with personal nutrition goals set during dietitian appointment.;Long Term Goal: Adherence to prescribed nutrition plan.      Nutrition Discharge: Rate Your Plate Scores:   Nutrition Goals Re-Evaluation:     Nutrition Goals Re-Evaluation    Row Name 08/14/17  1540             Goals   Current Weight 248 lb 8 oz (112.7 kg)       Nutrition Goal For heart healthy choices add >50% of whole grains, make half their plate fruits and vegetables. Discuss the difference between starchy vegetables and leafy greens, and how leafy vegetables provide fiber, helps maintain healthy weight, helps control blood glucose, and lowers cholesterol.  Discuss purchasing fresh or frozen vegetable to reduce sodium and not to add grease, fat or sugar. Consume <18oz of red meat per week. Consume lean cuts of meats and very little of meats high in sodium and nitrates such as pork and lunch meats. Discussed portion control for all food groups.        Comment Patient continues to work towards meeting her nutrition goals.        Expected Outcome Patient will meet her nutrition goals.           Nutrition Goals Discharge (Final Nutrition Goals Re-Evaluation):     Nutrition Goals Re-Evaluation - 08/14/17 1540      Goals   Current Weight 248 lb 8 oz (112.7 kg)   Nutrition Goal For heart healthy choices add >50% of whole grains, make half their plate fruits and vegetables. Discuss the difference between starchy vegetables and leafy greens, and how leafy vegetables provide fiber, helps maintain healthy weight, helps control blood glucose, and lowers cholesterol.  Discuss purchasing fresh or frozen vegetable to reduce sodium and not to add grease, fat or sugar. Consume <18oz of red meat per week. Consume lean cuts of meats and very little of meats high in sodium and nitrates such as pork and lunch meats. Discussed portion control for all food groups.    Comment Patient continues to work towards meeting her nutrition goals.    Expected Outcome Patient will meet her nutrition goals.       Psychosocial: Target Goals: Acknowledge presence or absence of significant depression and/or stress, maximize coping skills, provide positive support system. Participant is able to verbalize types and  ability to use techniques and skills needed for reducing stress and depression.  Initial Review & Psychosocial Screening:     Initial  Psych Review & Screening - 04/17/17 1504      Initial Review   Current issues with Current Depression;History of Depression     Family Dynamics   Good Support System? Yes     Barriers   Psychosocial barriers to participate in program Psychosocial barriers identified (see note)  QOL overal score 16.69. Discussed counseling. Patient stated she did not feel she needed it at this time.      Screening Interventions   Interventions Encouraged to exercise      Quality of Life Scores:     Quality of Life - 04/17/17 1155      Quality of Life Scores   Health/Function Pre 14.91 %   Socioeconomic Pre 17.29 %   Psych/Spiritual Pre 19.86 %   Family Pre 17.1 %   GLOBAL Pre 16.69 %      PHQ-9: Recent Review Flowsheet Data    Depression screen [facility] 2/9 05/28/2017 04/17/2017 04/08/2017 03/03/2017 12/13/2016   Decreased Interest 0 0 2 1 0   Down, Depressed, Hopeless 2 - 2 1 0   PHQ - 2 Score 2 0 4 2 0   Altered sleeping '2 2 1 2 '$ -   Tired, decreased energy '2 2 1 2 '$ -   Change in appetite '1 1 1 2 '$ -   Feeling bad or failure about yourself  '1 1 1 2 '$ -   Trouble concentrating 0 0 1 3 -   Moving slowly or fidgety/restless 1 0 1 1 -   Suicidal thoughts 0 0 1 1 -   PHQ-9 Score '9 6 11 15 '$ -     Interpretation of Total Score  Total Score Depression Severity:  1-4 = Minimal depression, 5-9 = Mild depression, 10-14 = Moderate depression, 15-19 = Moderately severe depression, 20-27 = Severe depression   Psychosocial Evaluation and Intervention:     Psychosocial Evaluation - 04/17/17 1506      Psychosocial Evaluation & Interventions   Interventions Stress management education;Relaxation education;Encouraged to exercise with the program and follow exercise prescription   Comments Patient QOL overall scores where low 16.69. Discussed patient seeking counseling.  Patient stated she did not feel she needed it at this time.    Continue Psychosocial Services  Follow up required by staff      Psychosocial Re-Evaluation:     Psychosocial Re-Evaluation    Row Name 05/26/17 1332 06/19/17 0746 07/17/17 1341 08/14/17 1548       Psychosocial Re-Evaluation   Current issues with Current Depression;History of Depression Current Depression Current Depression Current Depression    Comments Patient's initial QOL score was 16.69 and her PHQ-9 score was 7. She is currently being treated with Celexa 40 mg. She is not interested in counseling but feels her depression is managed on medication.  Patient's depression is managed with citalopram 40 mg daily.  Patient's depression continues to be managed with citalopram 40 mg daily.  Patient's depression continues to be managed with citalopram 40 mg daily.    Expected Outcomes Patient's QOL and PHQ-9 scores will improve or remain the same at discharge.  Patient's depression will be managed at discharge. Her PHQ-9 and QOL scores will improve or remain the same at discharge.  Patient will continue treatment for depression and have improved PHQ-9 and QOL scores at discharge.  Patient's depression will be controlled at discharge with no other psychosocial issues identified.     Interventions Relaxation education;Stress management education;Encouraged to attend Pulmonary Rehabilitation for the exercise Encouraged to attend  Pulmonary Rehabilitation for the exercise;Relaxation education;Stress management education Encouraged to attend Pulmonary Rehabilitation for the exercise;Relaxation education;Stress management education Encouraged to attend Pulmonary Rehabilitation for the exercise;Relaxation education;Stress management education    Continue Psychosocial Services  Follow up required by staff Follow up required by staff Follow up required by staff Follow up required by staff       Psychosocial Discharge (Final Psychosocial  Re-Evaluation):     Psychosocial Re-Evaluation - 08/14/17 1548      Psychosocial Re-Evaluation   Current issues with Current Depression   Comments Patient's depression continues to be managed with citalopram 40 mg daily.   Expected Outcomes Patient's depression will be controlled at discharge with no other psychosocial issues identified.    Interventions Encouraged to attend Pulmonary Rehabilitation for the exercise;Relaxation education;Stress management education   Continue Psychosocial Services  Follow up required by staff       Education: Education Goals: Education classes will be provided on a weekly basis, covering required topics. Participant will state understanding/return demonstration of topics presented.  Learning Barriers/Preferences:     Learning Barriers/Preferences - 04/17/17 1159      Learning Barriers/Preferences   Learning Barriers None   Learning Preferences Verbal Instruction;Individual Instruction      Education Topics: How Lungs Work and Diseases: - Discuss the anatomy of the lungs and diseases that can affect the lungs, such as COPD.   PULMONARY REHAB OTHER RESPIRATORY from 08/07/2017 in [place]  Date  (P) 07/10/17  Educator  (P) Gc      Exercise: -Discuss the importance of exercise, FITT principles of exercise, normal and abnormal responses to exercise, and how to exercise safely.   Environmental Irritants: -Discuss types of environmental irritants and how to limit exposure to environmental irritants.   PULMONARY REHAB OTHER RESPIRATORY from 08/07/2017 in [place]  Date  (P) 04/24/17  Educator  (P) GC      Meds/Inhalers and oxygen: - Discuss respiratory medications, definition of an inhaler and oxygen, and the proper way to use an inhaler and oxygen.   PULMONARY REHAB OTHER RESPIRATORY from 08/07/2017 in [place]  Date  (P) 05/01/17  Educator  (P) GC      Energy  Saving Techniques: - Discuss methods to conserve energy and decrease shortness of breath when performing activities of daily living.    PULMONARY REHAB OTHER RESPIRATORY from 08/07/2017 in [place]  Date  (P) 05/08/17  Educator  (P) [place]  Instruction Review Code  (P) 2- meets goals/outcomes      Bronchial Hygiene / Breathing Techniques: - Discuss breathing mechanics, pursed-lip breathing technique,  proper posture, effective ways to clear airways, and other functional breathing techniques   PULMONARY REHAB OTHER RESPIRATORY from 08/07/2017 in [place]  Date  (P) 05/15/17  Educator  (P) GC      Cleaning Equipment: - Provides group verbal and written instruction about the health risks of elevated stress, cause of high stress, and healthy ways to reduce stress.   PULMONARY REHAB OTHER RESPIRATORY from 08/07/2017 in [place]  Date  (P) 05/22/17  Educator  (P) GC      Nutrition I: Fats: - Discuss the types of cholesterol, what cholesterol does to the body, and how cholesterol levels can be controlled.   Nutrition II: Labels: -Discuss the different components of food labels and how to read food labels.   Respiratory Infections: - Discuss the signs and symptoms of  respiratory infections, ways to prevent respiratory infections, and the importance of seeking medical treatment when having a respiratory infection.   PULMONARY REHAB OTHER RESPIRATORY from 08/07/2017 in [place]  Date  (P) 06/12/17  Educator  (P) GC      Stress I: Signs and Symptoms: - Discuss the causes of stress, how stress may lead to anxiety and depression, and ways to limit stress.   PULMONARY REHAB OTHER RESPIRATORY from 08/07/2017 in [place]  Date  (P) 06/19/17  Educator  (P) GC      Stress II: Relaxation: -Discuss relaxation techniques to limit stress.   PULMONARY REHAB OTHER RESPIRATORY  from 08/07/2017 in [place]  Date  (P) 06/26/17  Educator  (P) DC      Oxygen for Home/Travel: - Discuss how to prepare for travel when on oxygen and proper ways to transport and store oxygen to ensure safety.   PULMONARY REHAB OTHER RESPIRATORY from 08/07/2017 in [place]  Date  (P) 07/03/17  Educator  (P) GC      Knowledge Questionnaire Score:     Knowledge Questionnaire Score - 04/17/17 1226      Knowledge Questionnaire Score   Pre Score 11/14      Core Components/Risk Factors/Patient Goals at Admission:     Personal Goals and Risk Factors at Admission - 04/17/17 1257      Core Components/Risk Factors/Patient Goals on Admission    Weight Management Weight Maintenance   Improve shortness of breath with ADL's Yes   Intervention Provide education, individualized exercise plan and daily activity instruction to help decrease symptoms of SOB with activities of daily living.   Expected Outcomes Short Term: Achieves a reduction of symptoms when performing activities of daily living.   Personal Goal Other Yes   Personal Goal Breathe better, Walk longer without SOB   Intervention Attend program 2 x week and supplement at home 3 x week.    Expected Outcomes Reach personal goals      Core Components/Risk Factors/Patient Goals Review:      Goals and Risk Factor Review    Row Name 05/26/17 1329 06/19/17 0742 07/17/17 1337 08/14/17 1543       Core Components/Risk Factors/Patient Goals Review   Personal Goals Review Weight Management/Obesity;Improve shortness of breath with ADL's  Breathe better; walk longer.  Weight Management/Obesity;Improve shortness of breath with ADL's  Breathe better; walk longer. Weight Management/Obesity;Improve shortness of breath with ADL's;Develop more efficient breathing techniques such as purse lipped breathing and diaphragmatic breathing and practicing self-pacing with activity.  Breathe better; walk  longer.  Weight Management/Obesity;Improve shortness of breath with ADL's;Develop more efficient breathing techniques such as purse lipped breathing and diaphragmatic breathing and practicing self-pacing with activity.;Increase knowledge of respiratory medications and ability to use respiratory devices properly.    Review Patient has completed 11 sessions losing 5.3 lbs. She is progressing in the program. Her O2 usage remains the same. She reports no improvement in her SOB but she does feel stronger and better overall. Will continue to monitor for progress.  Patient has completed 16 sessions losing 3.3 lbs. She is progressing and doing well in the program saying her SOB has improved some. Will continue to monitor.  Patient has completed 21 sessions losing 7 lbs. She has done well in the program but has not been able to attend recently d/t a sprained ankle. Will continue to monitor.  Patient has completed 27 sessions 10.8 lbs. She  continue to do well in the program. She says she feels much stronger and is breathing better. She is now able to walk up a flight of stairs. Her SOB has improved. She demonstrates proper pursed lip breathing techniques during sessions. Will continue to monitor.    Expected Outcomes Patient will complete the program and continue to work toward meeting her personal goals.  Patient will complete the program and meet her personal goals.  Patient will be able to return and complete the program meeting her personal goals.  Patient will complete the program and continue to meet her personal goals.        Core Components/Risk Factors/Patient Goals at Discharge (Final Review):      Goals and Risk Factor Review - 08/14/17 1543      Core Components/Risk Factors/Patient Goals Review   Personal Goals Review Weight Management/Obesity;Improve shortness of breath with ADL's;Develop more efficient breathing techniques such as purse lipped breathing and diaphragmatic breathing and practicing  self-pacing with activity.;Increase knowledge of respiratory medications and ability to use respiratory devices properly.   Review Patient has completed 27 sessions 10.8 lbs. She continue to do well in the program. She says she feels much stronger and is breathing better. She is now able to walk up a flight of stairs. Her SOB has improved. She demonstrates proper pursed lip breathing techniques during sessions. Will continue to monitor.   Expected Outcomes Patient will complete the program and continue to meet her personal goals.       ITP Comments:     ITP Comments    Row Name 04/30/17 1322 05/15/17 1247         ITP Comments Patient new to program completing 4 sessions. Will continue to monitor for progress.  Patient met with Registered Dietitian to discuss nutrition topics including: Heart healthty eating, heart healthy cooking and making smart choices when shopping; Portion control; weight management; and hydration. Patient attended a group session with the hospital chaplian called [facility] to discuss and share how this recent diagnosis has effected their life         Comments: ITP 30 Day REVIEW Pt is making expected progress toward pulmonary rehab goals after completing 27 sessions. Recommend continued exercise, life style modification, education, and utilization of breathing techniques to increase stamina and strength and decrease shortness of breath with exertion.

## 2017-08-19 ENCOUNTER — Encounter (HOSPITAL_COMMUNITY)
Admission: RE | Admit: 2017-08-19 | Discharge: 2017-08-19 | Disposition: A | Discharge status: Home / Self Care | Payer: Medicare PPO | Source: Ambulatory Visit | Attending: Cardiology | Admitting: Cardiology

## 2017-08-19 DIAGNOSIS — I2721 Secondary pulmonary arterial hypertension: Secondary | ICD-10-CM

## 2017-08-19 NOTE — Progress Notes (Signed)
Daily Session Note  Patient Details  Name: LETASHA KERSHAW MRN: [id number] Date of Birth: 06-29-1956 Referring Provider:     PULMONARY REHAB OTHER RESP ORIENTATION from 04/17/2017 in [place]  Referring Provider  Dr. [name]      Encounter Date: 08/19/2017  Check In:     Session Check In - 08/19/17 1405      Check-In   Location [facility]   Staff Present Russella Dar, MS, EP, [facility], Exercise Physiologist;Ezriel Boffa Luther Parody, BS, EP, Exercise Physiologist   Supervising physician immediately available to respond to emergencies See telemetry face sheet for immediately available MD   Medication changes reported     No   Fall or balance concerns reported    No   Warm-up and Cool-down Performed as group-led instruction   Resistance Training Performed Yes   VAD Patient? No     Pain Assessment   Currently in Pain? No/denies   Pain Score 0-No pain   Multiple Pain Sites No      Capillary Blood Glucose: No results found for this or any previous visit (from the past 24 hour(s)).    History  Smoking Status  . Former Smoker  . Packs/day: 0.25  . Years: 17.00  . Types: Cigarettes  . Quit date: 03/02/2015  Smokeless Tobacco  . Never Used    Goals Met:  Independence with exercise equipment Improved SOB with ADL's Using PLB without cueing & demonstrates good technique Exercise tolerated well No report of cardiac concerns or symptoms Strength training completed today  Goals Unmet:  Not Applicable  Comments: Check out 230   Dr. Sinda Du is Medical Director for [facility].

## 2017-08-21 ENCOUNTER — Encounter (HOSPITAL_COMMUNITY): Admission: RE | Admit: 2017-08-21 | Payer: Medicare PPO | Source: Ambulatory Visit

## 2017-08-26 ENCOUNTER — Encounter (HOSPITAL_COMMUNITY)
Admission: RE | Admit: 2017-08-26 | Discharge: 2017-08-26 | Disposition: A | Discharge status: Home / Self Care | Payer: Medicare PPO | Source: Ambulatory Visit | Attending: Cardiology | Admitting: Cardiology

## 2017-08-28 ENCOUNTER — Telehealth (HOSPITAL_COMMUNITY): Payer: Self-pay | Admitting: Pharmacist

## 2017-08-28 ENCOUNTER — Encounter (HOSPITAL_COMMUNITY)
Admission: RE | Admit: 2017-08-28 | Discharge: 2017-08-28 | Disposition: A | Discharge status: Home / Self Care | Payer: Medicare PPO | Source: Ambulatory Visit | Attending: Cardiology | Admitting: Cardiology

## 2017-08-28 DIAGNOSIS — I2721 Secondary pulmonary arterial hypertension: Secondary | ICD-10-CM | POA: Diagnosis not present

## 2017-08-28 NOTE — Progress Notes (Signed)
Daily Session Note  Patient Details  Name: Kelly Donaldson MRN: [id number] Date of Birth: [birth year] Referring Provider:     PULMONARY REHAB OTHER RESP ORIENTATION from 04/17/2017 in [place]  Referring Provider  Dr. [name]      Encounter Date: 08/28/2017  Check In:     Session Check In - 08/28/17 1345      Check-In   Location [facility]   Staff Present [name], RN, BSN;Nevelyn Mellott Luther Parody, BS, EP, Exercise Physiologist   Supervising physician immediately available to respond to emergencies See telemetry face sheet for immediately available MD   Medication changes reported     Yes   Fall or balance concerns reported    No   Warm-up and Cool-down Performed as group-led instruction   Resistance Training Performed Yes   VAD Patient? No     Pain Assessment   Currently in Pain? No/denies   Pain Score 0-No pain   Multiple Pain Sites No      Capillary Blood Glucose: No results found for this or any previous visit (from the past 24 hour(s)).    History  Smoking Status  . Former Smoker  . Packs/day: 0.25  . Years: 17.00  . Types: Cigarettes  . Quit date: 03/02/2015  Smokeless Tobacco  . Never Used    Goals Met:  Independence with exercise equipment Improved SOB with ADL's Using PLB without cueing & demonstrates good technique Exercise tolerated well No report of cardiac concerns or symptoms Strength training completed today  Goals Unmet:  Not Applicable  Comments: Check out 230   Dr. Sinda Du is Medical Director for [facility].

## 2017-08-28 NOTE — Telephone Encounter (Signed)
[employer] patient assistance approved for [employer] through 12/08/17.   Tyler Deis. Bonnye Fava, PharmD, BCPS, CPP Clinical Pharmacist Pager: [phone] Phone: [phone] 08/28/2017 2:17 PM

## 2017-08-29 ENCOUNTER — Encounter: Payer: Self-pay | Admitting: Physician Assistant

## 2017-08-29 ENCOUNTER — Ambulatory Visit (INDEPENDENT_AMBULATORY_CARE_PROVIDER_SITE_OTHER): Payer: Medicare PPO | Admitting: Physician Assistant

## 2017-08-29 VITALS — BP 96/58 | HR 58 | Temp 97.6°F | Ht 67.0 in | Wt 246.0 lb

## 2017-08-29 DIAGNOSIS — D869 Sarcoidosis, unspecified: Secondary | ICD-10-CM

## 2017-08-29 DIAGNOSIS — J40 Bronchitis, not specified as acute or chronic: Secondary | ICD-10-CM | POA: Diagnosis not present

## 2017-08-29 DIAGNOSIS — M5136 Other intervertebral disc degeneration, lumbar region: Secondary | ICD-10-CM

## 2017-08-29 DIAGNOSIS — I2729 Other secondary pulmonary hypertension: Secondary | ICD-10-CM

## 2017-08-29 DIAGNOSIS — I5032 Chronic diastolic (congestive) heart failure: Secondary | ICD-10-CM | POA: Diagnosis not present

## 2017-08-29 MED ORDER — HYDROCODONE-HOMATROPINE 5-1.5 MG/5ML PO SYRP: 5.0000 mL | ORAL_SOLUTION | Freq: Four times a day (QID) | ORAL | 0 refills | Status: DC | PRN

## 2017-08-29 MED ORDER — HYDROCODONE-ACETAMINOPHEN 10-325 MG PO TABS: 1.0000 | ORAL_TABLET | Freq: Four times a day (QID) | ORAL | 0 refills | Status: DC | PRN

## 2017-08-29 MED ORDER — CEFDINIR 300 MG PO CAPS: 300.0000 mg | ORAL_CAPSULE | Freq: Two times a day (BID) | ORAL | 0 refills | Status: DC

## 2017-08-29 MED ORDER — ZOLPIDEM TARTRATE 5 MG PO TABS: 5.0000 mg | ORAL_TABLET | Freq: Every day | ORAL | 2 refills | Status: DC

## 2017-08-29 NOTE — Patient Instructions (Signed)
In a few days you may receive a survey in the mail or online from [employer] regarding your visit with us today. Please take a moment to fill this out. Your feedback is very important to our whole office. It can help us better understand your needs as well as improve your experience and satisfaction. Thank you for taking your time to complete it. We care about you.  Traeton Bordas, PA-C  

## 2017-08-29 NOTE — Progress Notes (Signed)
BP (!) 96/58   Pulse (!) 58   Temp 97.6 F (36.4 C) (Oral)   Ht 5\' 7"  (1.702 m)   Wt 246 lb (111.6 kg)   BMI 38.53 kg/m    Subjective:    Patient ID: , female    DOB: [birth year], 61 y.o.   MRN: [id number]  HPI: Kelly Donaldson is a 61 y.o. female presenting on 08/29/2017 for Follow-up (3 month ) and Medication Refill  This patient comes in for periodic recheck on medications and conditions including DDD lumbar, bronchitis, chronic CHF, pulmonary hypertension.  Patient has an appointment next week with her cardiologist and will be seen her pulmonologist also. They have done a very good job controlling her pulmonary hypertension. She is having no new issues with that. Patient with several days of progressing upper respiratory and bronchial symptoms. Initially there was more upper respiratory congestion. This progressed to having significant cough that is productive throughout the day and severe at night. There is occasional wheezing after coughing. Sometimes there is slight dyspnea on exertion. It is productive mucus that is yellow in color. Denies any blood.  All medications are reviewed today. There are no reports of any problems with the medications. All of the medical conditions are reviewed and updated.  Lab work is reviewed and will be ordered as medically necessary. There are no new problems reported with today's visit.   Relevant past medical, surgical, family and social history reviewed and updated as indicated. Allergies and medications reviewed and updated.  Past Medical History:  Diagnosis Date  . Allergy   . Depression   . GERD (gastroesophageal reflux disease)   . Hyperlipidemia   . Oxygen dependent   . Shortness of breath dyspnea   . Varicose veins     Past Surgical History:  Procedure Laterality Date  . CARDIAC CATHETERIZATION N/A 04/20/2015   Procedure: Right Heart Cath;  Surgeon: 06/20/2015, MD;  Location: [facility] INVASIVE CV LAB;  Service:  Cardiovascular;  Laterality: N/A;  . RIGHT HEART CATH N/A 06/13/2017   Procedure: Right Heart Cath;  Surgeon: 08/14/2017, MD;  Location: [facility] INVASIVE CV LAB;  Service: Cardiovascular;  Laterality: N/A;  . TEE WITHOUT CARDIOVERSION N/A 05/11/2015   Procedure: TRANSESOPHAGEAL ECHOCARDIOGRAM (TEE);  Surgeon: 07/11/2015, MD;  Location: [facility] ENDOSCOPY;  Service: Cardiovascular;  Laterality: N/A;  . TUBAL LIGATION     11/84    Review of Systems  Constitutional: Positive for fatigue. Negative for activity change, appetite change, chills and fever.  HENT: Positive for congestion, postnasal drip and sore throat.   Eyes: Negative.   Respiratory: Positive for cough and wheezing. Negative for shortness of breath.   Cardiovascular: Negative.  Negative for chest pain, palpitations and leg swelling.  Gastrointestinal: Negative.   Genitourinary: Negative.   Musculoskeletal: Negative.   Skin: Negative.   Neurological: Positive for headaches.    Allergies as of 08/29/2017      Reactions   Gabapentin    Pt states that she can not take with antidepressants   Latex Rash      Medication List       Accurate as of 08/29/17  5:24 PM. Always use your most recent med list.          aspirin EC 81 MG tablet Take 1 tablet (81 mg total) by mouth daily.   cefdinir 300 MG capsule Commonly known as:  OMNICEF Take 1 capsule (300 mg total) by mouth 2 (two)  times daily. 1 po BID   cetirizine 10 MG tablet Commonly known as:  ZYRTEC Take 1 tablet (10 mg total) by mouth daily.   citalopram 40 MG tablet Commonly known as:  CELEXA TAKE ONE TABLET BY MOUTH ONCE DAILY FOR 30 DAYS   HYDROcodone-acetaminophen 10-325 MG tablet Commonly known as:  NORCO Take 1-2 tablets by mouth every 6 (six) hours as needed.   HYDROcodone-acetaminophen 10-325 MG tablet Commonly known as:  NORCO Take 1-2 tablets by mouth every 6 (six) hours as needed.   HYDROcodone-acetaminophen 10-325 MG tablet Commonly known as:   NORCO Take 1-2 tablets by mouth every 6 (six) hours as needed.   HYDROcodone-homatropine 5-1.5 MG/5ML syrup Commonly known as:  HYCODAN Take 5-10 mLs by mouth every 6 (six) hours as needed.   ipratropium-albuterol 0.5-2.5 (3) MG/3ML Soln Commonly known as:  DUONEB Take 3 mLs by nebulization 4 (four) times daily as needed.   Ipratropium-Albuterol 20-100 MCG/ACT Aers respimat Commonly known as:  COMBIVENT RESPIMAT Inhale 1 puff into the lungs every 6 (six) hours as needed for wheezing.   Macitentan 10 MG Tabs Commonly known as:  OPSUMIT Take 1 tablet (10 mg total) by mouth daily.   meloxicam 7.5 MG tablet Commonly known as:  MOBIC Take 7.5 mg by mouth at bedtime.   OXYGEN 2.5 lpm with sleep and 4 lpm with exertion  [facility]   potassium chloride SA 20 MEQ tablet Commonly known as:  K-DUR,KLOR-CON Take 1 tablet (20 mEq total) by mouth 2 (two) times daily.   pravastatin 20 MG tablet Commonly known as:  PRAVACHOL Take 1 tablet (20 mg total) by mouth daily.   Riociguat 2 MG Tabs Commonly known as:  ADEMPAS Take 2 mg by mouth 3 (three) times daily.   Selexipag 200 MCG Tabs Take 1 tablet (200 mcg total) by mouth 2 (two) times daily.   torsemide 20 MG tablet Commonly known as:  DEMADEX Take 2 tablets (40 mg total) by mouth daily.   trimethoprim-polymyxin b ophthalmic solution Commonly known as:  POLYTRIM Place 1 drop into both eyes 3 (three) times daily.   Vitamin D (Ergocalciferol) 50000 units Caps capsule Commonly known as:  DRISDOL TAKE ONE CAPSULE BY MOUTH ONCE WEEKLY FOR 30 DAYS   zolpidem 5 MG tablet Commonly known as:  AMBIEN Take 1 tablet (5 mg total) by mouth at bedtime.            Discharge Care Instructions        Start     Ordered   08/29/17 0000  HYDROcodone-acetaminophen (NORCO) 10-325 MG tablet  Every 6 hours PRN    Comments:  Fill 60 days from original script date  Question:  Supervising Provider  Answer:  Elenora Gamma   08/29/17  1131   08/29/17 0000  HYDROcodone-acetaminophen (NORCO) 10-325 MG tablet  Every 6 hours PRN    Question:  Supervising Provider  Answer:  Elenora Gamma   08/29/17 1131   08/29/17 0000  HYDROcodone-acetaminophen (NORCO) 10-325 MG tablet  Every 6 hours PRN    Comments:  Fill 30 days from original script date  Question:  Supervising Provider  Answer:  Elenora Gamma   08/29/17 1131   08/29/17 0000  HYDROcodone-homatropine (HYCODAN) 5-1.5 MG/5ML syrup  Every 6 hours PRN    Question:  Supervising Provider  Answer:  Elenora Gamma   08/29/17 1132   08/29/17 0000  cefdinir (OMNICEF) 300 MG capsule  2 times daily    Question:  Supervising Provider  Answer:  Elenora Gamma   08/29/17 1135   08/29/17 0000  zolpidem (AMBIEN) 5 MG tablet  Daily at bedtime    Question:  Supervising Provider  Answer:  Elenora Gamma   08/29/17 1135         Objective:    BP (!) 96/58   Pulse (!) 58   Temp 97.6 F (36.4 C) (Oral)   Ht 5\' 7"  (1.702 m)   Wt 246 lb (111.6 kg)   BMI 38.53 kg/m   Allergies  Allergen Reactions  . Gabapentin     Pt states that she can not take with antidepressants  . Latex Rash    Physical Exam  Constitutional: She is oriented to person, place, and time. She appears well-developed and well-nourished.  HENT:  Head: Normocephalic and atraumatic.  Right Ear: There is drainage and tenderness.  Left Ear: There is drainage and tenderness.  Nose: Mucosal edema and rhinorrhea present. Right sinus exhibits maxillary sinus tenderness and frontal sinus tenderness. Left sinus exhibits maxillary sinus tenderness and frontal sinus tenderness.  Mouth/Throat: Oropharyngeal exudate and posterior oropharyngeal erythema present.  Eyes: Pupils are equal, round, and reactive to light. Conjunctivae and EOM are normal.  Neck: Normal range of motion. Neck supple.  Cardiovascular: Normal rate, regular rhythm, normal heart sounds and intact distal pulses.   Pulmonary/Chest:  Effort normal. She has wheezes in the right upper field and the left upper field.  Abdominal: Soft. Bowel sounds are normal.  Neurological: She is alert and oriented to person, place, and time. She has normal reflexes.  Skin: Skin is warm and dry. No rash noted.  Psychiatric: She has a normal mood and affect. Her behavior is normal. Judgment and thought content normal.  Nursing note and vitals reviewed.   Results for orders placed or performed during the hospital encounter of 06/13/17  Basic metabolic panel  Result Value Ref Range   Sodium 136 135 - 145 mmol/L   Potassium 4.4 3.5 - 5.1 mmol/L   Chloride 100 (L) 101 - 111 mmol/L   CO2 27 22 - 32 mmol/L   Glucose, Bld 96 65 - 99 mg/dL   BUN 27 (H) 6 - 20 mg/dL   Creatinine, Ser 1.44 (H) 0.44 - 1.00 mg/dL   Calcium 9.0 8.9 - 31.5 mg/dL   GFR calc non Af Amer 41 (L) >60 mL/min   GFR calc Af Amer 48 (L) >60 mL/min   Anion gap 9 5 - 15  CBC  Result Value Ref Range   WBC 8.5 4.0 - 10.5 K/uL   RBC 3.96 3.87 - 5.11 MIL/uL   Hemoglobin 8.6 (L) 12.0 - 15.0 g/dL   HCT 40.0 (L) 86.7 - 61.9 %   MCV 75.5 (L) 78.0 - 100.0 fL   MCH 21.7 (L) 26.0 - 34.0 pg   MCHC 28.8 (L) 30.0 - 36.0 g/dL   RDW 50.9 (H) 32.6 - 71.2 %   Platelets 211 150 - 400 K/uL  Protime-INR  Result Value Ref Range   Prothrombin Time 12.8 11.4 - 15.2 seconds   INR 0.96   I-STAT 3, venous blood gas (G3P V)  Result Value Ref Range   pH, Ven 7.428 7.250 - 7.430   pCO2, Ven 42.3 (L) 44.0 - 60.0 mmHg   pO2, Ven 39.0 32.0 - 45.0 mmHg   Bicarbonate 27.9 20.0 - 28.0 mmol/L   TCO2 29 0 - 100 mmol/L   O2 Saturation 75.0 %   Acid-Base Excess  3.0 (H) 0.0 - 2.0 mmol/L   Patient temperature HIDE    Sample type VENOUS    Comment NOTIFIED PHYSICIAN   I-STAT 3, venous blood gas (G3P V)  Result Value Ref Range   pH, Ven 7.428 7.250 - 7.430   pCO2, Ven 43.4 (L) 44.0 - 60.0 mmHg   pO2, Ven 38.0 32.0 - 45.0 mmHg   Bicarbonate 28.6 (H) 20.0 - 28.0 mmol/L   TCO2 30 0 - 100 mmol/L    O2 Saturation 73.0 %   Acid-Base Excess 4.0 (H) 0.0 - 2.0 mmol/L   Patient temperature HIDE    Sample type VENOUS    Comment NOTIFIED PHYSICIAN       Assessment & Plan:   1. DDD (degenerative disc disease), lumbar - HYDROcodone-acetaminophen (NORCO) 10-325 MG tablet; Take 1-2 tablets by mouth every 6 (six) hours as needed.  Dispense: 240 tablet; Refill: 0 - HYDROcodone-acetaminophen (NORCO) 10-325 MG tablet; Take 1-2 tablets by mouth every 6 (six) hours as needed.  Dispense: 240 tablet; Refill: 0 - HYDROcodone-acetaminophen (NORCO) 10-325 MG tablet; Take 1-2 tablets by mouth every 6 (six) hours as needed.  Dispense: 240 tablet; Refill: 0  2. Bronchitis - HYDROcodone-homatropine (HYCODAN) 5-1.5 MG/5ML syrup; Take 5-10 mLs by mouth every 6 (six) hours as needed.  Dispense: 240 mL; Refill: 0 - cefdinir (OMNICEF) 300 MG capsule; Take 1 capsule (300 mg total) by mouth 2 (two) times daily. 1 po BID  Dispense: 20 capsule; Refill: 0  3. Chronic diastolic CHF (congestive heart failure) (HCC)   4. Pulmonary hypertension associated with sarcoidosis (HCC)     Current Outpatient Prescriptions:  .  aspirin EC 81 MG tablet, Take 1 tablet (81 mg total) by mouth daily., Disp: 90 tablet, Rfl: 3 .  cetirizine (ZYRTEC) 10 MG tablet, Take 1 tablet (10 mg total) by mouth daily. (Patient taking differently: Take 10 mg by mouth at bedtime. ), Disp: 90 tablet, Rfl: 3 .  citalopram (CELEXA) 40 MG tablet, TAKE ONE TABLET BY MOUTH ONCE DAILY FOR 30 DAYS, Disp: 30 tablet, Rfl: 2 .  HYDROcodone-acetaminophen (NORCO) 10-325 MG tablet, Take 1-2 tablets by mouth every 6 (six) hours as needed., Disp: 240 tablet, Rfl: 0 .  HYDROcodone-homatropine (HYCODAN) 5-1.5 MG/5ML syrup, Take 5-10 mLs by mouth every 6 (six) hours as needed., Disp: 240 mL, Rfl: 0 .  Ipratropium-Albuterol (COMBIVENT RESPIMAT) 20-100 MCG/ACT AERS respimat, Inhale 1 puff into the lungs every 6 (six) hours as needed for wheezing., Disp: 3 Inhaler, Rfl:  3 .  ipratropium-albuterol (DUONEB) 0.5-2.5 (3) MG/3ML SOLN, Take 3 mLs by nebulization 4 (four) times daily as needed. (Patient taking differently: Take 3 mLs by nebulization 4 (four) times daily as needed (for wheezing/shortness of breath). ), Disp: 360 mL, Rfl: 11 .  Macitentan (OPSUMIT) 10 MG TABS, Take 1 tablet (10 mg total) by mouth daily., Disp: 30 tablet, Rfl: 11 .  meloxicam (MOBIC) 7.5 MG tablet, Take 7.5 mg by mouth at bedtime., Disp: , Rfl:  .  OXYGEN, 2.5 lpm with sleep and 4 lpm with exertion  [facility], Disp: , Rfl:  .  potassium chloride SA (K-DUR,KLOR-CON) 20 MEQ tablet, Take 1 tablet (20 mEq total) by mouth 2 (two) times daily., Disp: 180 tablet, Rfl: 3 .  pravastatin (PRAVACHOL) 20 MG tablet, Take 1 tablet (20 mg total) by mouth daily. (Patient taking differently: Take 20 mg by mouth at bedtime. ), Disp: 90 tablet, Rfl: 3 .  Riociguat (ADEMPAS) 2 MG TABS, Take 2 mg by mouth  3 (three) times daily., Disp: 90 tablet, Rfl: 11 .  Selexipag 200 MCG TABS, Take 1 tablet (200 mcg total) by mouth 2 (two) times daily., Disp: 60 tablet, Rfl: 6 .  torsemide (DEMADEX) 20 MG tablet, Take 2 tablets (40 mg total) by mouth daily., Disp: 180 tablet, Rfl: 3 .  trimethoprim-polymyxin b (POLYTRIM) ophthalmic solution, Place 1 drop into both eyes 3 (three) times daily., Disp: , Rfl:  .  Vitamin D, Ergocalciferol, (DRISDOL) 50000 units CAPS capsule, TAKE ONE CAPSULE BY MOUTH ONCE WEEKLY FOR 30 DAYS (Patient taking differently: Take 50,000 Units by mouth every Monday. ), Disp: 12 capsule, Rfl: 3 .  zolpidem (AMBIEN) 5 MG tablet, Take 1 tablet (5 mg total) by mouth at bedtime., Disp: 30 tablet, Rfl: 2 .  cefdinir (OMNICEF) 300 MG capsule, Take 1 capsule (300 mg total) by mouth 2 (two) times daily. 1 po BID, Disp: 20 capsule, Rfl: 0 .  HYDROcodone-acetaminophen (NORCO) 10-325 MG tablet, Take 1-2 tablets by mouth every 6 (six) hours as needed., Disp: 240 tablet, Rfl: 0 .  HYDROcodone-acetaminophen (NORCO)  10-325 MG tablet, Take 1-2 tablets by mouth every 6 (six) hours as needed., Disp: 240 tablet, Rfl: 0 Continue all other maintenance medications as listed above.  Follow up plan: Return in about 3 months (around 11/28/2017) for recheck.  Educational handout given for survey  Remus Loffler PA-C [facility] [address]  [place], [place] [address] [phone]   08/29/2017, 5:24 PM

## 2017-09-02 ENCOUNTER — Encounter (HOSPITAL_COMMUNITY): Payer: Medicare PPO

## 2017-09-02 ENCOUNTER — Ambulatory Visit (HOSPITAL_COMMUNITY)
Admission: RE | Admit: 2017-09-02 | Discharge: 2017-09-02 | Disposition: A | Discharge status: Home / Self Care | Payer: Medicare PPO | Source: Ambulatory Visit | Attending: Cardiology | Admitting: Cardiology

## 2017-09-02 VITALS — BP 114/47 | HR 66 | Wt 246.1 lb

## 2017-09-02 DIAGNOSIS — I110 Hypertensive heart disease with heart failure: Secondary | ICD-10-CM | POA: Insufficient documentation

## 2017-09-02 DIAGNOSIS — J84112 Idiopathic pulmonary fibrosis: Secondary | ICD-10-CM | POA: Insufficient documentation

## 2017-09-02 DIAGNOSIS — I2721 Secondary pulmonary arterial hypertension: Secondary | ICD-10-CM | POA: Insufficient documentation

## 2017-09-02 DIAGNOSIS — E785 Hyperlipidemia, unspecified: Secondary | ICD-10-CM | POA: Insufficient documentation

## 2017-09-02 DIAGNOSIS — J449 Chronic obstructive pulmonary disease, unspecified: Secondary | ICD-10-CM | POA: Diagnosis not present

## 2017-09-02 DIAGNOSIS — Z87891 Personal history of nicotine dependence: Secondary | ICD-10-CM | POA: Insufficient documentation

## 2017-09-02 DIAGNOSIS — Z9981 Dependence on supplemental oxygen: Secondary | ICD-10-CM | POA: Diagnosis not present

## 2017-09-02 DIAGNOSIS — J9611 Chronic respiratory failure with hypoxia: Secondary | ICD-10-CM | POA: Diagnosis not present

## 2017-09-02 DIAGNOSIS — I5032 Chronic diastolic (congestive) heart failure: Secondary | ICD-10-CM | POA: Diagnosis not present

## 2017-09-02 DIAGNOSIS — Z7982 Long term (current) use of aspirin: Secondary | ICD-10-CM | POA: Diagnosis not present

## 2017-09-02 DIAGNOSIS — R590 Localized enlarged lymph nodes: Secondary | ICD-10-CM | POA: Diagnosis not present

## 2017-09-02 DIAGNOSIS — I872 Venous insufficiency (chronic) (peripheral): Secondary | ICD-10-CM | POA: Insufficient documentation

## 2017-09-02 LAB — BASIC METABOLIC PANEL
Anion gap: 9 (ref 5–15)
BUN: 23 mg/dL — AB (ref 6–20)
CHLORIDE: 104 mmol/L (ref 101–111)
CO2: 24 mmol/L (ref 22–32)
CREATININE: 1.25 mg/dL — AB (ref 0.44–1.00)
Calcium: 8.8 mg/dL — ABNORMAL LOW (ref 8.9–10.3)
GFR calc Af Amer: 53 mL/min — ABNORMAL LOW (ref >60)
GFR calc non Af Amer: 45 mL/min — ABNORMAL LOW (ref >60)
Glucose, Bld: 94 mg/dL (ref 65–99)
POTASSIUM: 4.6 mmol/L (ref 3.5–5.1)
SODIUM: 137 mmol/L (ref 135–145)

## 2017-09-02 LAB — BRAIN NATRIURETIC PEPTIDE: B NATRIURETIC PEPTIDE 5: 68.6 pg/mL (ref 0.0–100.0)

## 2017-09-02 NOTE — Patient Instructions (Signed)
Labs today  Your physician recommends that you schedule a follow-up appointment in: 3 months  

## 2017-09-02 NOTE — Progress Notes (Signed)
Patient ID: Kelly Donaldson, female   DOB: [birth year], 61 y.o.   MRN: [id number]   PCP: Prudy Feeler Pulmonology: Dr [name] Primary HF: Dr Shirlee Latch   Ms Drollinger is a 61 year old with history of chronic hypoxemic respiratory failure and pulmonary hypertension.  She has been followed by Dr [name], now follows with Dr. [name] with pulmonology.  Has been on home oxygen for 6 years.  She quit smoking in 2016.  She had an echo done in 5/16 showing dilated RV with severe pulmonary hypertension.  She therefore had RHC in 5/16 which confirmed severe pulmonary hypertension and RV failure.  PFTs showed only mild obstruction and restriction with markedly decreased DLCO suggestive of pulmonary vascular disease.  There was concern for sarcoidosis on CT chest but lymph node biopsy showed no evidence for sarcoidosis.  V/Q scan showed no evidence for chronic PE.   She has not tolerated Revatio or Adcirca.  Have failed previous up-titration of Selexipag with pelvic pain, can tolerate 200 mcg bid.  She is now tolerating riociguat 1 mg tid.   Started torsemide in Oct. 2017, has had better diuresis. At home 02 sats range in upper 80s/low 90s with 02 via [place] 3-5Lpm.   Pt presents today for PAH follow up.  RHC after last visit with elevated filling pressures. Torsemide increased.  Has since been decreased back down to 40 mg daily with orthostasis at home. Her weight started to trend back up so she went back to 40 mg BID. Weight down 9 lbs from last visit. She continues to do pulmonary rehab.  Breathing is good. Short of breath walking moderate distances. Denies orthopnea or PND. No chest pain. Denies lightheadedness or dizziness.    Echo 06/05/17 LVEF 60-65%, Mild dilated RV, mild dilated RAE, PA peak pressure 65 mm Hg.   RHC 06/13/17  RA mean 13 RV 54/17 PA 51/23, mean 36 PCWP mean 19 Oxygen saturations: PA 73% AO 97% Cardiac Output (Fick) 10.5  Cardiac Index (Fick) 4.74 PVR 1.6 WU  6 minute walk (5/16): 238 meters =>  decreased oxygen saturation to 73%, increased to 6 L [place] 6 minute walk (7/16): 170 meters => unable to complete.  6 minute walk (9/16): 201 meters 6 minute walk (1/17): 256 meters 6 minute walk (4/17): 219 meters 6 minute walk (8/17): 225 meters 6 minute walk (11/17): 244 meters 6 minute walk (5/18): 229 meters  Labs (2/16): BNP 69 Labs (5/16): LFTs normal, HCT 34.9, RF negative, HIV negative, TSH negative Labs (6/16): K 4.4, creatinine 0.88 Labs (7/16): ANA negative, BNP 58, K 4.3, creatinine 1.1, anti-RNP negative Labs (9/16): K 4.4, creatinine 1.26, BNP 91 Labs (10/16): K 4.3, creatinine 1.04 Labs (12/16): K 4.2, creatinine 1.07, BNP 130 Labs (1/17): K 4.3, creatinine 1.23, BNP 42 Labs (3/17): K 4.2, creatinine 1.19 Labs (5/17): K 4.3, creatinine 1.35, BNP 44 Labs (6/17): K 4.4, creatinine 1.18, BNP 44 Labs (10/17): K 4.2, creatinine 0.98, BNP 43 Labs (12/17): K 4.5, creatinine 1.23. BNP 64.5 Labs (2/18): K 4.5, creatinine 1.21, BNP 64 Labs (4/18): K 4.1, creatinine 1.24, BNP 26 . PMH: 1. Pulmonary hypertension: RHC (5/16) with mean RA 18, PA 83/33 mean 53, mean PCWP 19, CI 2.61, PVR 5.8 WU.   Echo (5/16) with EF 65-70%, septal flattening, dilated RV with PA systolic pressure 99 mmHg, +bubble study.  PFTs (4/16) with FVC 76%, FEV1 75%, ratio 97%, TLC 66%, DLCO 26% => mild restriction, mild obstruction, severe diffuse abnormality (pulmonary vascular problem).  CTA chest (4/16) with chronic interstitial and obstructive lung disease, small mediastinal and hilar lymph noes, no PE.  Lymph node biopsy negative for sarcoidosis (reactive changes).  V/Q scan (6/16): No acute or chronic PE. TEE (6/16): Normal LV size and systolic function, EF 55-60%,RV was mildly dilated with mildly decreased systolic function, D-shaped interventricular septum suggestive of RV pressure/volume overload, there appeared to be a small PFO present with some bubbles crossing, no ASD.  ANA negative, anti-RNP negative,  HIV negative. Sleep study (8/16) without OSA.  She did not tolerate Adcirca or Revatio.  She cannot tolerate more than 200 mcg bid Selexipag.  - Echo (6/17): EF 60-65%, normal diastolic function, D-shaped interventricular septum, mildly dilated RV with normal RV systolic function, PASP 39, IVC normal.  - Echo (6/18): EF 60-65%, normla diastolic function, D-shaped interventricular septum, mildly dilated RV with normal RV systolic function, PASP 65 mmHg.  2. Chronic venous insufficiency. 3. Hyperlipidemia.  4. GERD 5. Chronic hypoxemic respiratory failure: Hypoxemia noted x 6 years at least.  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   SH: Married, quit smoking in 3/16.  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    FH: No pulmonary hypertension, no premature CAD, no sudden death.   Review of systems complete and found to be negative unless listed in HPI.    Current Outpatient Prescriptions  Medication Sig Dispense Refill  . aspirin EC 81 MG tablet Take 1 tablet (81 mg total) by mouth daily. 90 tablet 3  . cefdinir (OMNICEF) 300 MG capsule Take 1 capsule (300 mg total) by mouth 2 (two) times daily. 1 po BID 20 capsule 0  . cetirizine (ZYRTEC) 10 MG tablet Take 1 tablet (10 mg total) by mouth daily. (Patient taking differently: Take 10 mg by mouth at bedtime. ) 90 tablet 3  . citalopram (CELEXA) 40 MG tablet TAKE ONE TABLET BY MOUTH ONCE DAILY FOR 30 DAYS 30 tablet 2  . HYDROcodone-acetaminophen (NORCO) 10-325 MG tablet Take 1-2 tablets by mouth every 6 (six) hours as needed. 240 tablet 0  . HYDROcodone-acetaminophen (NORCO) 10-325 MG tablet Take 1-2 tablets by mouth every 6 (six) hours as needed. 240 tablet 0  . HYDROcodone-homatropine (HYCODAN) 5-1.5 MG/5ML syrup Take 5-10 mLs by mouth every 6 (six) hours as needed. 240 mL 0  . Ipratropium-Albuterol (COMBIVENT RESPIMAT) 20-100 MCG/ACT AERS respimat Inhale 1 puff into the lungs every 6 (six) hours as needed  for wheezing. 3 Inhaler 3  . ipratropium-albuterol (DUONEB) 0.5-2.5 (3) MG/3ML SOLN Take 3 mLs by nebulization 4 (four) times daily as needed. (Patient taking differently: Take 3 mLs by nebulization 4 (four) times daily as needed (for wheezing/shortness of breath). ) 360 mL 11  . Macitentan (OPSUMIT) 10 MG TABS Take 1 tablet (10 mg total) by mouth daily. 30 tablet 11  . meloxicam (MOBIC) 7.5 MG tablet Take 7.5 mg by mouth at bedtime.    . OXYGEN 2.5 lpm with sleep and 4 lpm with exertion  [facility]    . potassium chloride SA (K-DUR,KLOR-CON) 20 MEQ tablet Take 1 tablet (20 mEq total) by mouth 2 (two) times daily. 180 tablet 3  . pravastatin (PRAVACHOL) 20 MG tablet Take 1 tablet (20 mg total) by mouth daily. (Patient taking differently: Take 20 mg by mouth at bedtime. ) 90 tablet 3  . Riociguat (ADEMPAS) 2 MG TABS Take 2 mg by mouth 3 (three) times daily. 90 tablet 11  . Selexipag 200 MCG TABS Take 1  tablet (200 mcg total) by mouth 2 (two) times daily. 60 tablet 6  . torsemide (DEMADEX) 20 MG tablet Take 2 tablets (40 mg total) by mouth daily. (Patient taking differently: Take 40 mg by mouth 2 (two) times daily. ) 180 tablet 3  . Vitamin D, Ergocalciferol, (DRISDOL) 50000 units CAPS capsule TAKE ONE CAPSULE BY MOUTH ONCE WEEKLY FOR 30 DAYS (Patient taking differently: Take 50,000 Units by mouth every Monday. ) 12 capsule 3  . zolpidem (AMBIEN) 5 MG tablet Take 1 tablet (5 mg total) by mouth at bedtime. 30 tablet 2   No current facility-administered medications for this encounter.    BP (!) 114/47   Pulse 66   Wt 246 lb 1.9 oz (111.6 kg)   SpO2 96% Comment: 5L  BMI 38.55 kg/m   Wt Readings from Last 3 Encounters:  09/02/17 246 lb 1.9 oz (111.6 kg)  08/29/17 246 lb (111.6 kg)  06/13/17 248 lb (112.5 kg)    General: NAD. Wearing oxygen.  HEENT: Normal Neck: Supple. JVP 8-9 cm. Carotids 2+ bilat; no bruits. No thyromegaly or nodule noted. Cor: PMI nondisplaced. RRR, 2/6 HSM LLSB.    Lungs: CTAB, normal effort. Abdomen: Soft, non-tender, non-distended, no HSM. No bruits or masses. +BS  Extremities: No cyanosis, clubbing, or rash. Trace to 1+ ankle edema.  Neuro: Alert & orientedx3, cranial nerves grossly intact. moves all 4 extremities w/o difficulty. Affect pleasant   Assessment/Plan:  1. Pulmonary arterial hypertension with RV failure: Severe on prior RHC. 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  There was concern from CTA chest for sarcoidosis, but lymph node biopsy was negative.  LFTs normal, ANA negative, anti-RNP negative, HIV negative.  V/Q scan negative for chronic PE.  Small PFO but no ASD on TEE.  No OSA on sleep study.  She did not tolerate Revatio due to joint pain (now resolved), and she did not tolerate Adcirca with worsening dyspnea.  She does not tolerate more than 200 mcg bid Selexipag due to pelvic pain.  Echo 6/17 showed mildly dilated RV with normal systolic function and PASP appeared lower at 39 mmHg. Echo in 6/18 showed mildly dilated RV with normal systolic function, but PASP appeared higher than prior at 65 mmHg. - NYHA II-III symptoms - Volume status stable.  - RHC in 7/18 showed lower PA pressure and low PVR => primarily pulmonary venous hypertension at this point.  - Open lung biopsy deemed too risky, probably not sarcoid.   - Continue torsemide 40 mg daily. BMEt/BNP today.  - Continue opsumit, selexipag, and riociguat 2 mg twice a day (unable to go higher due to dizziness).  Intolerant to up-titration of selexipag due to joint pain.  - Defer 6 minute walk today because she is wearing flip flops => will do at pulmonary rehab.  2. Chronic hypoxemic respiratory failure: Chronic interstitial and obstructive lung disease, mediastinal and hilar lymphadenopathy.  Concern for sarcoidosis.  PFTs showed only mild restriction and obstruction.  Biopsy did not show sarcoid.   Deemed too high risk for open lung biopsy.  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 - O2 sats stable. Continue current regimen.  - She follows with Dr. [name] in pulmonary.   Graciella Freer, PA-C  09/02/2017   Patient seen with PA, agree with the above note. Weight is down 9 lbs.  Stable to improved symptomatically.  RHC in 7/18 showed lower PA  pressure and primarily pulmonary venous hypertension at this point.  Will continue current torsemide and current PH meds.  BMET today.   Followup in 3 months.   [name] 09/02/2017

## 2017-09-04 ENCOUNTER — Encounter (HOSPITAL_COMMUNITY)
Admission: RE | Admit: 2017-09-04 | Discharge: 2017-09-04 | Disposition: A | Discharge status: Home / Self Care | Payer: Medicare PPO | Source: Ambulatory Visit | Attending: Cardiology | Admitting: Cardiology

## 2017-09-04 DIAGNOSIS — I2721 Secondary pulmonary arterial hypertension: Secondary | ICD-10-CM | POA: Diagnosis not present

## 2017-09-04 NOTE — Progress Notes (Signed)
Daily Session Note  Patient Details  Name: Kelly Donaldson MRN: [id number] Date of Birth: 21-Jul-1956 Referring Provider:     PULMONARY REHAB OTHER RESP ORIENTATION from 04/17/2017 in [place]  Referring Provider  Dr. [name]      Encounter Date: 09/04/2017  Check In:     Session Check In - 09/04/17 1332      Check-In   Location [facility]   Staff Present [name], RN, BSN;Rilei Kravitz Luther Parody, BS, EP, Exercise Physiologist   Supervising physician immediately available to respond to emergencies See telemetry face sheet for immediately available MD   Medication changes reported     No   Fall or balance concerns reported    No   Warm-up and Cool-down Performed as group-led instruction   Resistance Training Performed Yes   VAD Patient? No     Pain Assessment   Currently in Pain? No/denies   Pain Score 0-No pain   Multiple Pain Sites No      Capillary Blood Glucose: No results found for this or any previous visit (from the past 24 hour(s)).    History  Smoking Status  . Former Smoker  . Packs/day: 0.25  . Years: 17.00  . Types: Cigarettes  . Quit date: 03/02/2015  Smokeless Tobacco  . Never Used    Goals Met:  Independence with exercise equipment Improved SOB with ADL's Using PLB without cueing & demonstrates good technique Exercise tolerated well No report of cardiac concerns or symptoms Strength training completed today  Goals Unmet:  Not Applicable  Comments: Check out 230   Dr. Sinda Du is Medical Director for [facility].

## 2017-09-05 NOTE — Addendum Note (Signed)
Encounter addended by: Joette Catching on: 09/05/2017  2:57 PM<BR>    Actions taken: Flowsheet data copied forward, Flowsheet accepted, Visit Navigator Flowsheet section accepted

## 2017-09-09 ENCOUNTER — Encounter (HOSPITAL_COMMUNITY)
Admission: RE | Admit: 2017-09-09 | Discharge: 2017-09-09 | Disposition: A | Discharge status: Home / Self Care | Payer: Medicare PPO | Source: Ambulatory Visit | Attending: Cardiology | Admitting: Cardiology

## 2017-09-09 DIAGNOSIS — I2721 Secondary pulmonary arterial hypertension: Secondary | ICD-10-CM | POA: Insufficient documentation

## 2017-09-09 NOTE — Progress Notes (Signed)
Daily Session Note  Patient Details  Name: Kelly Donaldson MRN: [id number] Date of Birth: [birth year] Referring Provider:     PULMONARY REHAB OTHER RESP ORIENTATION from 04/17/2017 in [place]  Referring Provider  Dr. [name]      Encounter Date: 09/09/2017  Check In:     Session Check In - 09/09/17 1339      Check-In   Location [facility]   Staff Present [name], MS, EP, [facility], Exercise Physiologist;Jadie Comas Luther Parody, BS, EP, Exercise Physiologist   Supervising physician immediately available to respond to emergencies See telemetry face sheet for immediately available MD   Medication changes reported     No   Fall or balance concerns reported    No   Warm-up and Cool-down Performed as group-led instruction   Resistance Training Performed Yes   VAD Patient? No     Pain Assessment   Currently in Pain? No/denies   Pain Score 0-No pain   Multiple Pain Sites No      Capillary Blood Glucose: No results found for this or any previous visit (from the past 24 hour(s)).    History  Smoking Status  . Former Smoker  . Packs/day: 0.25  . Years: 17.00  . Types: Cigarettes  . Quit date: 03/02/2015  Smokeless Tobacco  . Never Used    Goals Met:  Independence with exercise equipment Improved SOB with ADL's Using PLB without cueing & demonstrates good technique Exercise tolerated well No report of cardiac concerns or symptoms Strength training completed today  Goals Unmet:  Not Applicable  Comments: Check out 230   Dr. Sinda Du is Medical Director for [facility].

## 2017-09-11 ENCOUNTER — Encounter (HOSPITAL_COMMUNITY)
Admission: RE | Admit: 2017-09-11 | Discharge: 2017-09-11 | Disposition: A | Discharge status: Home / Self Care | Payer: Medicare PPO | Source: Ambulatory Visit | Attending: Cardiology | Admitting: Cardiology

## 2017-09-11 DIAGNOSIS — I2721 Secondary pulmonary arterial hypertension: Secondary | ICD-10-CM | POA: Diagnosis not present

## 2017-09-11 NOTE — Progress Notes (Signed)
Daily Session Note  Patient Details  Name: Kelly Donaldson MRN: [id number] Date of Birth: 06-17-1956 Referring Provider:     PULMONARY REHAB OTHER RESP ORIENTATION from 04/17/2017 in [place]  Referring Provider  Dr. [name]      Encounter Date: 09/11/2017  Check In:     Session Check In - 09/11/17 1405      Check-In   Location [facility]   Staff Present [name], RN, BSN;Emre Stock Luther Parody, BS, EP, Exercise Physiologist   Supervising physician immediately available to respond to emergencies See telemetry face sheet for immediately available MD   Medication changes reported     No   Fall or balance concerns reported    No   Warm-up and Cool-down Performed as group-led instruction   Resistance Training Performed Yes   VAD Patient? No     Pain Assessment   Currently in Pain? No/denies   Pain Score 0-No pain   Multiple Pain Sites No      Capillary Blood Glucose: No results found for this or any previous visit (from the past 24 hour(s)).    History  Smoking Status  . Former Smoker  . Packs/day: 0.25  . Years: 17.00  . Types: Cigarettes  . Quit date: 03/02/2015  Smokeless Tobacco  . Never Used    Goals Met:  Independence with exercise equipment Improved SOB with ADL's Using PLB without cueing & demonstrates good technique Exercise tolerated well No report of cardiac concerns or symptoms Strength training completed today  Goals Unmet:  Not Applicable  Comments: Check out 230   Dr. Sinda Du is Medical Director for [facility].

## 2017-09-11 NOTE — Progress Notes (Signed)
Pulmonary Individual Treatment Plan  Patient Details  Name: Kelly Donaldson MRN: [id number] Date of Birth: [birth year] Referring Provider:     PULMONARY REHAB OTHER RESP ORIENTATION from 04/17/2017 in [place]  Referring Provider  Dr. [name]      Initial Encounter Date:    [place] from 04/17/2017 in [place]  Date  04/17/17  Referring Provider  Dr. [name]      Visit Diagnosis: PAH (pulmonary artery hypertension) ([place])  Patient's Home Medications on Admission:   Current Outpatient Prescriptions:  .  aspirin EC 81 MG tablet, Take 1 tablet (81 mg total) by mouth daily., Disp: 90 tablet, Rfl: 3 .  cefdinir (OMNICEF) 300 MG capsule, Take 1 capsule (300 mg total) by mouth 2 (two) times daily. 1 po BID, Disp: 20 capsule, Rfl: 0 .  cetirizine (ZYRTEC) 10 MG tablet, Take 1 tablet (10 mg total) by mouth daily. (Patient taking differently: Take 10 mg by mouth at bedtime. ), Disp: 90 tablet, Rfl: 3 .  citalopram (CELEXA) 40 MG tablet, TAKE ONE TABLET BY MOUTH ONCE DAILY FOR 30 DAYS, Disp: 30 tablet, Rfl: 2 .  HYDROcodone-acetaminophen (NORCO) 10-325 MG tablet, Take 1-2 tablets by mouth every 6 (six) hours as needed., Disp: 240 tablet, Rfl: 0 .  HYDROcodone-acetaminophen (NORCO) 10-325 MG tablet, Take 1-2 tablets by mouth every 6 (six) hours as needed., Disp: 240 tablet, Rfl: 0 .  HYDROcodone-homatropine (HYCODAN) 5-1.5 MG/5ML syrup, Take 5-10 mLs by mouth every 6 (six) hours as needed., Disp: 240 mL, Rfl: 0 .  Ipratropium-Albuterol (COMBIVENT RESPIMAT) 20-100 MCG/ACT AERS respimat, Inhale 1 puff into the lungs every 6 (six) hours as needed for wheezing., Disp: 3 Inhaler, Rfl: 3 .  ipratropium-albuterol (DUONEB) 0.5-2.5 (3) MG/3ML SOLN, Take 3 mLs by nebulization 4 (four) times daily as needed. (Patient taking differently: Take 3 mLs by nebulization 4 (four) times daily as needed (for wheezing/shortness of breath). ), Disp:  360 mL, Rfl: 11 .  Macitentan (OPSUMIT) 10 MG TABS, Take 1 tablet (10 mg total) by mouth daily., Disp: 30 tablet, Rfl: 11 .  meloxicam (MOBIC) 7.5 MG tablet, Take 7.5 mg by mouth at bedtime., Disp: , Rfl:  .  OXYGEN, 2.5 lpm with sleep and 4 lpm with exertion  [employer], Disp: , Rfl:  .  potassium chloride SA (K-DUR,KLOR-CON) 20 MEQ tablet, Take 1 tablet (20 mEq total) by mouth 2 (two) times daily., Disp: 180 tablet, Rfl: 3 .  pravastatin (PRAVACHOL) 20 MG tablet, Take 1 tablet (20 mg total) by mouth daily. (Patient taking differently: Take 20 mg by mouth at bedtime. ), Disp: 90 tablet, Rfl: 3 .  Riociguat (ADEMPAS) 2 MG TABS, Take 2 mg by mouth 3 (three) times daily., Disp: 90 tablet, Rfl: 11 .  Selexipag 200 MCG TABS, Take 1 tablet (200 mcg total) by mouth 2 (two) times daily., Disp: 60 tablet, Rfl: 6 .  torsemide (DEMADEX) 20 MG tablet, Take 2 tablets (40 mg total) by mouth daily. (Patient taking differently: Take 40 mg by mouth 2 (two) times daily. ), Disp: 180 tablet, Rfl: 3 .  Vitamin D, Ergocalciferol, (DRISDOL) 50000 units CAPS capsule, TAKE ONE CAPSULE BY MOUTH ONCE WEEKLY FOR 30 DAYS (Patient taking differently: Take 50,000 Units by mouth every Monday. ), Disp: 12 capsule, Rfl: 3 .  zolpidem (AMBIEN) 5 MG tablet, Take 1 tablet (5 mg total) by mouth at bedtime., Disp: 30 tablet, Rfl: 2  Past Medical History: Past Medical History:  Diagnosis Date  . Allergy   . Depression   . GERD (gastroesophageal reflux disease)   . Hyperlipidemia   . Oxygen dependent   . Shortness of breath dyspnea   . Varicose veins     Tobacco Use: History  Smoking Status  . Former Smoker  . Packs/day: 0.25  . Years: 17.00  . Types: Cigarettes  . Quit date: 03/02/2015  Smokeless Tobacco  . Never Used    Labs: Recent Review Flowsheet Data    Labs for ITP Cardiac and Pulmonary Rehab Latest Ref Rng & Units 06/13/2017 06/13/2017   HCO3 20.0 - 28.0 mmol/L 27.9 28.6(H)   TCO2 0 - 100 mmol/L 29 30   O2SAT  % 75.0 73.0      Capillary Blood Glucose: No results found for: GLUCAP   Pulmonary Assessment Scores:     Pulmonary Assessment Scores    Row Name 04/17/17 1229         ADL UCSD   ADL Phase Entry     SOB Score total 67     Rest 0     Walk 8     Stairs 5     Bath 0     Dress 2     Shop 3       CAT Score   CAT Score 16       mMRC Score   mMRC Score 4        Pulmonary Function Assessment:     Pulmonary Function Assessment - 04/17/17 1226      Pulmonary Function Tests   FVC% 76 %   FEV1% 75 %   FEV1/FVC Ratio 97   DLCO% 26 %     Initial Spirometry Results   FVC% 76 %   FEV1% 74 %   FEV1/FVC Ratio 96     Post Bronchodilator Spirometry Results   FVC% 76 %   FEV1% 74 %   FEV1/FVC Ratio 96     Breath   Bilateral Breath Sounds Clear   Shortness of Breath Yes      Exercise Target Goals:    Exercise Program Goal: Individual exercise prescription set with THRR, safety & activity barriers. Participant demonstrates ability to understand and report RPE using BORG scale, to self-measure pulse accurately, and to acknowledge the importance of the exercise prescription.  Exercise Prescription Goal: Starting with aerobic activity 30 plus minutes a day, 3 days per week for initial exercise prescription. Provide home exercise prescription and guidelines that participant acknowledges understanding prior to discharge.  Activity Barriers & Risk Stratification:   6 Minute Walk:     6 Minute Walk    Row Name 04/17/17 1152         6 Minute Walk   Phase Initial     Distance 750 feet     Distance % Change 0 %     Walk Time 6 minutes     # of Rest Breaks 1     MPH 1.42     METS 2.08     RPE 15     Perceived Dyspnea  16     VO2 Peak 7.44     Symptoms No     Resting HR 74 bpm     Resting BP 94/50     Max Ex. HR 143 bpm     Max Ex. BP 134/62     2 Minute Post BP 106/56        Oxygen Initial Assessment:  Oxygen Initial Assessment - 04/17/17 1235       Home Oxygen   Home Oxygen Device Home Concentrator;Portable Concentrator   Sleep Oxygen Prescription None   Home Exercise Oxygen Prescription Continuous   Liters per minute 4   Home at Rest Exercise Oxygen Prescription Continuous   Liters per minute 2   Compliance with Home Oxygen Use Yes     Initial 6 min Walk   Oxygen Used Continuous   Liters per minute 4   Resting Oxygen Saturation  94 %   Exercise Oxygen Saturation  during 6 min walk 72 %  Had patient stop to rest, increase oxygen to 6 liters, SaO2 increased to 90 mg/dl. then test was resumed and patient finished the walk test.      Program Oxygen Prescription   Program Oxygen Prescription Continuous     Intervention   Short Term Goals To learn and demonstrate proper purse lipped breathing techniques or other breathing techniques.;To learn and exhibit compliance with exercise, home and travel O2 prescription;To Learn and understand importance of maintaining oxygen saturations>88%   Long  Term Goals Maintenance of O2 saturations>88%      Oxygen Re-Evaluation:     Oxygen Re-Evaluation    Row Name 05/26/17 1327 06/19/17 0748 07/17/17 1329 08/14/17 1541 09/11/17 1605     Program Oxygen Prescription   Program Oxygen Prescription _0    Liters per minute _1 FiO2% 91 92 91 91 90   Comments  -  - Patient uses 3-5 l/min during exercise session depending on her SOB and her O2 Sat levels.   - Patient continues to use 3-5 l/min during exercise depending on her O2 Sat.      Home Oxygen   Home Oxygen Device Home Concentrator Home Concentrator Home Concentrator;Portable Concentrator Home Concentrator;Portable Concentrator Home Concentrator;E-Tanks   Sleep Oxygen Prescription _2    Liters per minute _3 Home Exercise Oxygen Prescription _4    Liters per minute _5 Home at Rest Exercise Oxygen Prescription _6    Liters per minute _7 Compliance with Home Oxygen Use _8      Goals/Expected Outcomes   Short Term Goals To Learn and understand importance of maintaining oxygen saturations>88% To learn and exhibit compliance with exercise, home and travel O2 prescription To learn and exhibit compliance with exercise, home and travel O2 prescription;To Learn and understand importance of maintaining oxygen saturations>88%;To learn and understand importance of monitoring SPO2 with pulse oximeter and demonstrate accurate use of the pulse oximeter.;To learn and demonstrate proper purse lipped breathing techniques or other breathing techniques. To learn and exhibit compliance with exercise, home and travel O2 prescription;To learn and understand importance of monitoring SPO2 with pulse oximeter and demonstrate accurate use of the pulse oximeter.;To learn and understand importance of maintaining oxygen saturations>88%;To learn and demonstrate proper pursed lip breathing techniques or other breathing techniques. To learn and exhibit compliance with exercise, home and travel O2 prescription;To learn and understand importance of monitoring SPO2 with pulse oximeter and demonstrate accurate use of the pulse oximeter.;To learn and understand importance of maintaining oxygen saturations>88%;To learn and demonstrate proper pursed lip breathing techniques or other breathing techniques.;To learn and demonstrate proper use of respiratory medications   Long  Term Goals Maintenance of O2 saturations>88% Exhibits compliance  with exercise, home and travel O2 prescription Verbalizes importance of monitoring SPO2 with pulse oximeter and return demonstration;Maintenance of O2 saturations>88%;Exhibits proper breathing techniques, such as purse lipped breathing or other method taught during program session  Exhibits compliance with exercise, home and travel O2 prescription;Verbalizes importance of monitoring SPO2 with pulse oximeter and return demonstration;Maintenance of O2 saturations>88%;Exhibits proper breathing techniques, such as pursed lip breathing or other method taught during program session Exhibits compliance with exercise, home and travel O2 prescription;Verbalizes importance of monitoring SPO2 with pulse oximeter and return demonstration;Maintenance of O2 saturations>88%;Exhibits proper breathing techniques, such as pursed lip breathing or other method taught during program session;Compliance with respiratory medication   Comments Patient is compliant with her O2 prescription. Her O2 saturation is 91-97 at PR. Her O2 runs at 2 L/min.   - Patient compliant with O2 prescription. Patient contiunes to be compliant with O2 prescription. Patient continues to be compliant with her O2 prescription. She demonstrates proper pursed lip breathing during PR and says she is compliant with all of her medication. She verbalizes understanding of keeping her O2 Sat levels above 88%. Will continue to monitor.    Goals/Expected Outcomes Patient will continue to be compliant with her O2 prescription and continue to maintain her O2 saturation levels at 91-97.  Patient will continue to be compliant with her O2 prescription. Patient will continue to be compliant with O2 prescription. Patient is compliant with O2 prescription. Patient will continue to meet her respiratory goals.       Oxygen Discharge (Final Oxygen Re-Evaluation):     Oxygen Re-Evaluation - 09/11/17 1605      Program Oxygen Prescription   Program Oxygen Prescription Continuous   Liters per minute 3   FiO2% 90   Comments Patient continues to use 3-5 l/min during exercise depending on her O2 Sat.      Home Oxygen   Home Oxygen Device Home Concentrator;E-Tanks   Sleep Oxygen Prescription Continuous   Liters per minute 2   Home Exercise Oxygen  Prescription Continuous   Liters per minute 3   Home at Rest Exercise Oxygen Prescription Continuous   Liters per minute 2   Compliance with Home Oxygen Use Yes     Goals/Expected Outcomes   Short Term Goals To learn and exhibit compliance with exercise, home and travel O2 prescription;To learn and understand importance of monitoring SPO2 with pulse oximeter and demonstrate accurate use of the pulse oximeter.;To learn and understand importance of maintaining oxygen saturations>88%;To learn and demonstrate proper pursed lip breathing techniques or other breathing techniques.;To learn and demonstrate proper use of respiratory medications   Long  Term Goals Exhibits compliance with exercise, home and travel O2 prescription;Verbalizes importance of monitoring SPO2 with pulse oximeter and return demonstration;Maintenance of O2 saturations>88%;Exhibits proper breathing techniques, such as pursed lip breathing or other method taught during program session;Compliance with respiratory medication   Comments Patient continues to be compliant with her O2 prescription. She demonstrates proper pursed lip breathing during PR and says she is compliant with all of her medication. She verbalizes understanding of keeping her O2 Sat levels above 88%. Will continue to monitor.    Goals/Expected Outcomes Patient will continue to meet her respiratory goals.       Initial Exercise Prescription:     Initial Exercise Prescription - 04/17/17 1100      Date of Initial Exercise RX and Referring Provider   Date 04/17/17   Referring Provider Dr. [name]     Oxygen   Oxygen  Continuous   Liters 6     Treadmill   MPH 1.4   Grade 0   Minutes 15   METs 2     NuStep   Level 2   SPM 15   Minutes 20   METs 1.8     Prescription Details   Frequency (times per week) 2   Duration Progress to 30 minutes of continuous aerobic without signs/symptoms of physical distress     Intensity   THRR 40-80% of Max Heartrate  [phone]   Ratings of Perceived Exertion 11-13   Perceived Dyspnea 0-4     Progression   Progression Continue progressive overload as per policy without signs/symptoms or physical distress.     Resistance Training   Training Prescription Yes   Weight 1   Reps 10-15      Perform Capillary Blood Glucose checks as needed.  Exercise Prescription Changes:      Exercise Prescription Changes    Row Name 04/28/17 1400 05/13/17 1500 05/22/17 0800 06/12/17 0700 07/02/17 1400     Response to Exercise   Blood Pressure (Admit) 122/80 116/58 100/56 106/56 150/62   Blood Pressure (Exercise) 136/82 138/72 118/68 124/66 100/60   Blood Pressure (Exit) 92/60 124/68 104/66 104/60 98/58   Heart Rate (Admit) 71 bpm 72 bpm 76 bpm 65 bpm 74 bpm   Heart Rate (Exercise) 92 bpm 76 bpm 86 bpm 111 bpm 70 bpm   Heart Rate (Exit) 72 bpm 93 bpm 72 bpm 77 bpm 65 bpm   Oxygen Saturation (Admit) 92 % 94 % 90 % 93 % 93 %   Oxygen Saturation (Exercise) 90 % 90 % 95 % 90 % 92 %   Oxygen Saturation (Exit) 97 % 94 % 94 % 94 % 94 %   Rating of Perceived Exertion (Exercise) _0 Perceived Dyspnea (Exercise) _1 Duration Progress to 30 minutes of  aerobic without signs/symptoms of physical distress Progress to 30 minutes of  aerobic without signs/symptoms of physical distress Progress to 30 minutes of  aerobic without signs/symptoms of physical distress Progress to 30 minutes of  aerobic without signs/symptoms of physical distress Progress to 30 minutes of  aerobic without signs/symptoms of physical distress   Intensity _2      Progression   Progression Continue to progress workloads to maintain intensity without signs/symptoms of physical distress. Continue to progress workloads to maintain intensity without signs/symptoms of physical distress. Continue to progress workloads to maintain intensity without signs/symptoms of  physical distress. Continue to progress workloads to maintain intensity without signs/symptoms of physical distress. Continue to progress workloads to maintain intensity without signs/symptoms of physical distress.     Resistance Training   Training Prescription _3    Weight 1 1 0 0 0   Reps 10-15 10-15 10-15 10-15 10-15     Oxygen   Oxygen _4    Liters _5 Treadmill   MPH 1.4 1.4 1.5 1.6 1.6   Grade 0 0 0 0 0   Minutes _6 METs 2 2 2.15 2.2 2.2     NuStep   Level _7 SPM _8 Minutes _9 METs 3.73 3.74 3.73 2.8 1.9  Home Exercise Plan   Plans to continue exercise at Home (comment) Home (comment) Home (comment) Home (comment) Home (comment)   Frequency Add 2 additional days to program exercise sessions. Add 2 additional days to program exercise sessions. Add 2 additional days to program exercise sessions. Add 2 additional days to program exercise sessions. Add 2 additional days to program exercise sessions.   Row Name 07/23/17 1400 08/06/17 1400 08/26/17 1200 09/05/17 1400       Response to Exercise   Blood Pressure (Admit) 100/58 110/66 104/54 106/58    Blood Pressure (Exercise) 112/62 120/68 118/60 124/66    Blood Pressure (Exit) 98/60 110/64 100/58 106/64    Heart Rate (Admit) 67 bpm 71 bpm 68 bpm 74 bpm    Heart Rate (Exercise) 80 bpm 81 bpm 85 bpm 92 bpm    Heart Rate (Exit) 65 bpm 63 bpm 70 bpm 71 bpm    Oxygen Saturation (Admit) 92 % 100 % 100 % 96 %    Oxygen Saturation (Exercise) 90 % 89 % 90 % 90 %    Oxygen Saturation (Exit) 94 % 98 % 99 % 95 %    Rating of Perceived Exertion (Exercise) _0 Perceived Dyspnea (Exercise) _1 Duration Progress to 30 minutes of  aerobic without signs/symptoms of physical distress Progress to 30 minutes of  aerobic without signs/symptoms of physical distress Progress to 30 minutes of   aerobic without signs/symptoms of physical distress Progress to 30 minutes of  aerobic without signs/symptoms of physical distress    Intensity THRR unchanged THRR unchanged THRR New THRR New  [phone]      Progression   Progression Continue to progress workloads to maintain intensity without signs/symptoms of physical distress. Continue to progress workloads to maintain intensity without signs/symptoms of physical distress. Continue to progress workloads to maintain intensity without signs/symptoms of physical distress.  [phone] Continue to progress workloads to maintain intensity without signs/symptoms of physical distress.  [phone]      Resistance Training   Training Prescription Yes Yes Yes Yes    Weight 0 0 0 0    Reps 10-15 10-15 10-15 10-15      Oxygen   Oxygen Continuous Continuous Continuous Continuous    Liters _2 NuStep   Level _3 SPM 90 95 96 87    Minutes 34 34 34 34    METs 2.1 2.1 2.1 2      Home Exercise Plan   Plans to continue exercise at Home (comment) Home (comment) Home (comment) Home (comment)    Frequency Add 2 additional days to program exercise sessions. Add 2 additional days to program exercise sessions. Add 2 additional days to program exercise sessions. Add 2 additional days to program exercise sessions.       Exercise Comments:      Exercise Comments    Row Name 04/28/17 1449 05/13/17 1532 05/22/17 0819 06/12/17 0739 07/02/17 1416   Exercise Comments Patient has just recently started PR and will be progressed in time.  Patient is doing well in PR. Patient is doing well in PR.  Patient is doing well in PR.  Patient has not been progressed due to health concerns   [place] Name 07/23/17 1412 08/06/17 1438 08/26/17 1251 09/05/17 1457     Exercise Comments Patient has progressed on the Nustep but has been take off  of teh treadmill for now due to a sprained foot/ankle.  Patient is doing well in Pr. she is maintaining her levels on  the Nustep but has not increased in difficulty due to a foot injury.  Patient is doing well in PR and is maintaining her levels and watts. Patient is doing well in PR she has increased he level on her Nustep. She plans on joining maintenance when she completes the [facility] program.        Exercise Goals and Review:      Exercise Goals    Row Name 04/17/17 1256             Exercise Goals   Increase Physical Activity Yes       Intervention Provide advice, education, support and counseling about physical activity/exercise needs.;Develop an individualized exercise prescription for aerobic and resistive training based on initial evaluation findings, risk stratification, comorbidities and participant's personal goals.       Expected Outcomes Achievement of increased cardiorespiratory fitness and enhanced flexibility, muscular endurance and strength shown through measurements of functional capacity and personal statement of participant.       Increase Strength and Stamina Yes       Intervention Provide advice, education, support and counseling about physical activity/exercise needs.;Develop an individualized exercise prescription for aerobic and resistive training based on initial evaluation findings, risk stratification, comorbidities and participant's personal goals.       Expected Outcomes Achievement of increased cardiorespiratory fitness and enhanced flexibility, muscular endurance and strength shown through measurements of functional capacity and personal statement of participant.          Exercise Goals Re-Evaluation :     Exercise Goals Re-Evaluation    Row Name 05/26/17 1337 06/19/17 0744 07/17/17 1339 08/14/17 1546 08/26/17 1251     Exercise Goal Re-Evaluation   Exercise Goals Review Increase Physical Activity;Increase Strenth and Stamina Increase Physical Activity;Increase Strenth and Stamina Increase Physical Activity;Increase Strenth and Stamina Increase Physical  Activity;Increase Strength and Stamina Increase Physical Activity;Increase Strength and Stamina;Knowledge and understanding of Target Heart Rate Range (THRR)   Comments After completing 11 sessions, patient is progressing with some increased strength and stamina. She says she has not increased her activity but feels the program is benefiting her.  Patient has completed 16 sessions with progression in her treadmill speed and nustep level. She says she is getting stronger and the program is helping her meet her goals. Will continue to monitor.  Patient has completed 21 sessions. She has not been progressed some with increased treadmill speed since last 30 day review. She has had some issues with hypotension and now has a sprained ankle that has prevented her attending. Will continue to monitor.  Patient contiunes to do well in the program. She says she is getting stronger with more endurance. Will continue to monitor. Patient is doing well in PR and is maintaining her levels and watts.   Expected Outcomes Patient will complete the program continued increased strength, stamina, and activity.  Patient will complete the program with increased strength, stamina, and activity. Patient will be able to return to the program and complete with increased strength, stamina, and activity. Patient will complete the program and continue to meet her exercise goals.  Patient wants to  breathe better and have the ability to walk longer   Row Name 09/05/17 1456             Exercise Goal Re-Evaluation   Exercise Goals Review Increase Physical Activity;Increase Strength and  Stamina;Knowledge and understanding of Target Heart Rate Range (THRR)       Comments Patient is doing well in PR she has increased he level on her Nustep. She plans on joining maintenance when she completes the [facility] program.        Expected Outcomes Patient's goals are to walk longer and to breathe better.           Discharge Exercise Prescription  (Final Exercise Prescription Changes):     Exercise Prescription Changes - 09/05/17 1400      Response to Exercise   Blood Pressure (Admit) 106/58   Blood Pressure (Exercise) 124/66   Blood Pressure (Exit) 106/64   Heart Rate (Admit) 74 bpm   Heart Rate (Exercise) 92 bpm   Heart Rate (Exit) 71 bpm   Oxygen Saturation (Admit) 96 %   Oxygen Saturation (Exercise) 90 %   Oxygen Saturation (Exit) 95 %   Rating of Perceived Exertion (Exercise) 10   Perceived Dyspnea (Exercise) 10   Duration Progress to 30 minutes of  aerobic without signs/symptoms of physical distress   Intensity THRR New  [phone]     Progression   Progression Continue to progress workloads to maintain intensity without signs/symptoms of physical distress.  [phone]     Resistance Training   Training Prescription Yes   Weight 0   Reps 10-15     Oxygen   Oxygen Continuous   Liters 6     NuStep   Level 4   SPM 87   Minutes 34   METs 2     Home Exercise Plan   Plans to continue exercise at Home (comment)   Frequency Add 2 additional days to program exercise sessions.      Nutrition:  Target Goals: Understanding of nutrition guidelines, daily intake of sodium <1537m, cholesterol <2068m calories 30% from fat and 7% or less from saturated fats, daily to have 5 or more servings of fruits and vegetables.  Biometrics:     Pre Biometrics - 04/17/17 1154      Pre Biometrics   Height _0  (1.702 m)   Weight 259 lb 0.7 oz (117.5 kg)   Waist Circumference 43 inches   Hip Circumference 52 inches   Waist to Hip Ratio 0.83 %   BMI (Calculated) 40.7   Triceps [place] 31 mm   % Body Fat 48 %   Grip Strength 47.86 kg   Flexibility 0 in   Single Leg Stand 2 seconds       Nutrition Therapy Plan and Nutrition Goals:     Nutrition Therapy & Goals - 08/14/17 1539      Personal Nutrition Goals   Nutrition Goal For heart healthy choices add >50% of whole grains, make half their plate fruits and  vegetables. Discuss the difference between starchy vegetables and leafy greens, and how leafy vegetables provide fiber, helps maintain healthy weight, helps control blood glucose, and lowers cholesterol.  Discuss purchasing fresh or frozen vegetable to reduce sodium and not to add grease, fat or sugar. Consume <18oz of red meat per week. Consume lean cuts of meats and very little of meats high in sodium and nitrates such as pork and lunch meats. Discussed portion control for all food groups.    Additional Goals? No     Intervention Plan   Intervention Prescribe, educate and counsel regarding individualized specific dietary modifications aiming towards targeted core components such as weight, hypertension, lipid management, diabetes, heart failure and other comorbidities.;Nutrition handout(s) given  to patient.   Expected Outcomes Short Term Goal: Understand basic principles of dietary content, such as calories, fat, sodium, cholesterol and nutrients.;Short Term Goal: A plan has been developed with personal nutrition goals set during dietitian appointment.;Long Term Goal: Adherence to prescribed nutrition plan.      Nutrition Discharge: Rate Your Plate Scores:   Nutrition Goals Re-Evaluation:     Nutrition Goals Re-Evaluation    Row Name 08/14/17 1540 09/11/17 1607           Goals   Current Weight 248 lb 8 oz (112.7 kg) 246 lb 12.8 oz (111.9 kg)      Nutrition Goal For heart healthy choices add >50% of whole grains, make half their plate fruits and vegetables. Discuss the difference between starchy vegetables and leafy greens, and how leafy vegetables provide fiber, helps maintain healthy weight, helps control blood glucose, and lowers cholesterol.  Discuss purchasing fresh or frozen vegetable to reduce sodium and not to add grease, fat or sugar. Consume <18oz of red meat per week. Consume lean cuts of meats and very little of meats high in sodium and nitrates such as pork and lunch meats.  Discussed portion control for all food groups.  For heart healthy choices add >50% of whole grains, make half their plate fruits and vegetables. Discuss the difference between starchy vegetables and leafy greens, and how leafy vegetables provide fiber, helps maintain healthy weight, helps control blood glucose, and lowers cholesterol.  Discuss purchasing fresh or frozen vegetable to reduce sodium and not to add grease, fat or sugar. Consume <18oz of red meat per week. Consume lean cuts of meats and very little of meats high in sodium and nitrates such as pork and lunch meats. Discussed portion control for all food groups.       Comment Patient continues to work towards meeting her nutrition goals.  Patient has completed 32 sessions losing 12.2 lbs. She says she is eating smaller portions and feels she is meeting her nutrition goals. Will continue to monitor for progress.       Expected Outcome Patient will meet her nutrition goals.  Patient will continue to meet her nutrition goals.          Nutrition Goals Discharge (Final Nutrition Goals Re-Evaluation):     Nutrition Goals Re-Evaluation - 09/11/17 1607      Goals   Current Weight 246 lb 12.8 oz (111.9 kg)   Nutrition Goal For heart healthy choices add >50% of whole grains, make half their plate fruits and vegetables. Discuss the difference between starchy vegetables and leafy greens, and how leafy vegetables provide fiber, helps maintain healthy weight, helps control blood glucose, and lowers cholesterol.  Discuss purchasing fresh or frozen vegetable to reduce sodium and not to add grease, fat or sugar. Consume <18oz of red meat per week. Consume lean cuts of meats and very little of meats high in sodium and nitrates such as pork and lunch meats. Discussed portion control for all food groups.    Comment Patient has completed 32 sessions losing 12.2 lbs. She says she is eating smaller portions and feels she is meeting her nutrition goals. Will  continue to monitor for progress.    Expected Outcome Patient will continue to meet her nutrition goals.       Psychosocial: Target Goals: Acknowledge presence or absence of significant depression and/or stress, maximize coping skills, provide positive support system. Participant is able to verbalize types and ability to use techniques and skills needed for  reducing stress and depression.  Initial Review & Psychosocial Screening:     Initial Psych Review & Screening - 04/17/17 1504      Initial Review   Current issues with Current Depression;History of Depression     Family Dynamics   Good Support System? Yes     Barriers   Psychosocial barriers to participate in program Psychosocial barriers identified (see note)  QOL overal score 16.69. Discussed counseling. Patient stated she did not feel she needed it at this time.      Screening Interventions   Interventions Encouraged to exercise      Quality of Life Scores:     Quality of Life - 04/17/17 1155      Quality of Life Scores   Health/Function Pre 14.91 %   Socioeconomic Pre 17.29 %   Psych/Spiritual Pre 19.86 %   Family Pre 17.1 %   GLOBAL Pre 16.69 %      PHQ-9: Recent Review Flowsheet Data    Depression screen [facility] 2/9 08/29/2017 05/28/2017 04/17/2017 04/08/2017 03/03/2017   Decreased Interest 0 0 0 2 1   Down, Depressed, Hopeless 1 2 - 2 1   PHQ - 2 Score 1 2 0 4 2   Altered sleeping - _0 Tired, decreased energy - _1 Change in appetite - _2 Feeling bad or failure about yourself  - _3 Trouble concentrating - 0 0 1 3   Moving slowly or fidgety/restless - 1 0 1 1   Suicidal thoughts - 0 0 1 1   PHQ-9 Score - _4 Interpretation of Total Score  Total Score Depression Severity:  1-4 = Minimal depression, 5-9 = Mild depression, 10-14 = Moderate depression, 15-19 = Moderately severe depression, 20-27 = Severe depression   Psychosocial Evaluation and Intervention:      Psychosocial Evaluation - 04/17/17 1506      Psychosocial Evaluation & Interventions   Interventions Stress management education;Relaxation education;Encouraged to exercise with the program and follow exercise prescription   Comments Patient QOL overall scores where low 16.69. Discussed patient seeking counseling. Patient stated she did not feel she needed it at this time.    Continue Psychosocial Services  Follow up required by staff      Psychosocial Re-Evaluation:     Psychosocial Re-Evaluation    Row Name 05/26/17 1332 06/19/17 0746 07/17/17 1341 08/14/17 1548 09/11/17 1611     Psychosocial Re-Evaluation   Current issues with Current Depression;History of Depression Current Depression Current Depression Current Depression Current Depression   Comments Patient's initial QOL score was 16.69 and her PHQ-9 score was 7. She is currently being treated with Celexa 40 mg. She is not interested in counseling but feels her depression is managed on medication.  Patient's depression is managed with citalopram 40 mg daily.  Patient's depression continues to be managed with citalopram 40 mg daily.  Patient's depression continues to be managed with citalopram 40 mg daily. Depression continues to be managed with Citalopram.    Expected Outcomes Patient's QOL and PHQ-9 scores will improve or remain the same at discharge.  Patient's depression will be managed at discharge. Her PHQ-9 and QOL scores will improve or remain the same at discharge.  Patient will continue treatment for depression and have improved PHQ-9 and QOL scores at discharge.  Patient's depression will be controlled at discharge with no  other psychosocial issues identified.  Patient's depression will continued to be managed at discharge with no additional psychosocial barriers identified.    Interventions Relaxation education;Stress management education;Encouraged to attend Pulmonary Rehabilitation for the exercise Encouraged to attend  Pulmonary Rehabilitation for the exercise;Relaxation education;Stress management education Encouraged to attend Pulmonary Rehabilitation for the exercise;Relaxation education;Stress management education Encouraged to attend Pulmonary Rehabilitation for the exercise;Relaxation education;Stress management education Encouraged to attend Pulmonary Rehabilitation for the exercise;Relaxation education;Stress management education   Continue Psychosocial Services  Follow up required by staff Follow up required by staff Follow up required by staff Follow up required by staff Follow up required by staff      Psychosocial Discharge (Final Psychosocial Re-Evaluation):     Psychosocial Re-Evaluation - 09/11/17 1611      Psychosocial Re-Evaluation   Current issues with Current Depression   Comments Depression continues to be managed with Citalopram.    Expected Outcomes Patient's depression will continued to be managed at discharge with no additional psychosocial barriers identified.    Interventions Encouraged to attend Pulmonary Rehabilitation for the exercise;Relaxation education;Stress management education   Continue Psychosocial Services  Follow up required by staff       Education: Education Goals: Education classes will be provided on a weekly basis, covering required topics. Participant will state understanding/return demonstration of topics presented.  Learning Barriers/Preferences:     Learning Barriers/Preferences - 04/17/17 1159      Learning Barriers/Preferences   Learning Barriers None   Learning Preferences Verbal Instruction;Individual Instruction      Education Topics: How Lungs Work and Diseases: - Discuss the anatomy of the lungs and diseases that can affect the lungs, such as COPD.   PULMONARY REHAB OTHER RESPIRATORY from 09/11/2017 in [place]  Date  07/10/17  Educator  Gc      Exercise: -Discuss the importance of exercise, FITT principles of  exercise, normal and abnormal responses to exercise, and how to exercise safely.   Environmental Irritants: -Discuss types of environmental irritants and how to limit exposure to environmental irritants.   PULMONARY REHAB OTHER RESPIRATORY from 09/11/2017 in [place]  Date  04/24/17  Educator  GC      Meds/Inhalers and oxygen: - Discuss respiratory medications, definition of an inhaler and oxygen, and the proper way to use an inhaler and oxygen.   PULMONARY REHAB OTHER RESPIRATORY from 09/11/2017 in [place]  Date  05/01/17  Educator  GC      Energy Saving Techniques: - Discuss methods to conserve energy and decrease shortness of breath when performing activities of daily living.    PULMONARY REHAB OTHER RESPIRATORY from 09/11/2017 in [place]  Date  05/08/17  Educator  [place]  Instruction Review Code  2- meets goals/outcomes      Bronchial Hygiene / Breathing Techniques: - Discuss breathing mechanics, pursed-lip breathing technique,  proper posture, effective ways to clear airways, and other functional breathing techniques   PULMONARY REHAB OTHER RESPIRATORY from 09/11/2017 in [place]  Date  05/15/17  Educator  GC      Cleaning Equipment: - Provides group verbal and written instruction about the health risks of elevated stress, cause of high stress, and healthy ways to reduce stress.   PULMONARY REHAB OTHER RESPIRATORY from 09/11/2017 in [place]  Date  05/22/17  Educator  GC      Nutrition I: Fats: - Discuss the types of cholesterol, what cholesterol does to  the body, and how cholesterol levels can be controlled.   PULMONARY REHAB OTHER RESPIRATORY from 09/11/2017 in [place]  Date  08/28/17  Educator  [place]  Instruction Review Code  2- Demonstrated Understanding      Nutrition II: Labels: -Discuss the different components of  food labels and how to read food labels.   PULMONARY REHAB OTHER RESPIRATORY from 09/11/2017 in [place]  Date  09/04/17  Educator  [place]  Instruction Review Code  2- Demonstrated Understanding      Respiratory Infections: - Discuss the signs and symptoms of respiratory infections, ways to prevent respiratory infections, and the importance of seeking medical treatment when having a respiratory infection.   PULMONARY REHAB OTHER RESPIRATORY from 09/11/2017 in [place]  Date  06/12/17  Educator  GC      Stress I: Signs and Symptoms: - Discuss the causes of stress, how stress may lead to anxiety and depression, and ways to limit stress.   PULMONARY REHAB OTHER RESPIRATORY from 09/11/2017 in [place]  Date  06/19/17  Educator  GC      Stress II: Relaxation: -Discuss relaxation techniques to limit stress.   PULMONARY REHAB OTHER RESPIRATORY from 09/11/2017 in [place]  Date  06/26/17  Educator  DC      Oxygen for Home/Travel: - Discuss how to prepare for travel when on oxygen and proper ways to transport and store oxygen to ensure safety.   PULMONARY REHAB OTHER RESPIRATORY from 09/11/2017 in [place]  Date  07/03/17  Educator  GC      Knowledge Questionnaire Score:     Knowledge Questionnaire Score - 04/17/17 1226      Knowledge Questionnaire Score   Pre Score 11/14      Core Components/Risk Factors/Patient Goals at Admission:     Personal Goals and Risk Factors at Admission - 04/17/17 1257      Core Components/Risk Factors/Patient Goals on Admission    Weight Management Weight Maintenance   Improve shortness of breath with ADL's Yes   Intervention Provide education, individualized exercise plan and daily activity instruction to help decrease symptoms of SOB with activities of daily living.   Expected Outcomes Short Term: Achieves a reduction of  symptoms when performing activities of daily living.   Personal Goal Other Yes   Personal Goal Breathe better, Walk longer without SOB   Intervention Attend program 2 x week and supplement at home 3 x week.    Expected Outcomes Reach personal goals      Core Components/Risk Factors/Patient Goals Review:      Goals and Risk Factor Review    Row Name 05/26/17 1329 06/19/17 0742 07/17/17 1337 08/14/17 1543 09/11/17 1609     Core Components/Risk Factors/Patient Goals Review   Personal Goals Review Weight Management/Obesity;Improve shortness of breath with ADL's  Breathe better; walk longer.  Weight Management/Obesity;Improve shortness of breath with ADL's  Breathe better; walk longer. Weight Management/Obesity;Improve shortness of breath with ADL's;Develop more efficient breathing techniques such as purse lipped breathing and diaphragmatic breathing and practicing self-pacing with activity.  Breathe better; walk longer.  Weight Management/Obesity;Improve shortness of breath with ADL's;Develop more efficient breathing techniques such as purse lipped breathing and diaphragmatic breathing and practicing self-pacing with activity.;Increase knowledge of respiratory medications and ability to use respiratory devices properly. Weight Management/Obesity;Develop more efficient breathing techniques such as purse lipped breathing and diaphragmatic breathing and practicing self-pacing with activity.;Improve shortness  of breath with ADL's;Increase knowledge of respiratory medications and ability to use respiratory devices properly.  Breathe better and walk longer.    Review Patient has completed 11 sessions losing 5.3 lbs. She is progressing in the program. Her O2 usage remains the same. She reports no improvement in her SOB but she does feel stronger and better overall. Will continue to monitor for progress.  Patient has completed 16 sessions losing 3.3 lbs. She is progressing and doing well in the program  saying her SOB has improved some. Will continue to monitor.  Patient has completed 21 sessions losing 7 lbs. She has done well in the program but has not been able to attend recently d/t a sprained ankle. Will continue to monitor.  Patient has completed 27 sessions 10.8 lbs. She continue to do well in the program. She says she feels much stronger and is breathing better. She is now able to walk up a flight of stairs. Her SOB has improved. She demonstrates proper pursed lip breathing techniques during sessions. Will continue to monitor. Patient has completed 32 sessions and continues to do well in the program. She says she is breathing better and her SOB has improved. She is able to walk longer and is able to climb stairs now. She feels the program continues to help her meet her goals.    Expected Outcomes Patient will complete the program and continue to work toward meeting her personal goals.  Patient will complete the program and meet her personal goals.  Patient will be able to return and complete the program meeting her personal goals.  Patient will complete the program and continue to meet her personal goals.  Patient will complete the program and continue to meet her personal goals.       Core Components/Risk Factors/Patient Goals at Discharge (Final Review):      Goals and Risk Factor Review - 09/11/17 1609      Core Components/Risk Factors/Patient Goals Review   Personal Goals Review Weight Management/Obesity;Develop more efficient breathing techniques such as purse lipped breathing and diaphragmatic breathing and practicing self-pacing with activity.;Improve shortness of breath with ADL's;Increase knowledge of respiratory medications and ability to use respiratory devices properly.  Breathe better and walk longer.    Review Patient has completed 32 sessions and continues to do well in the program. She says she is breathing better and her SOB has improved. She is able to walk longer and is able  to climb stairs now. She feels the program continues to help her meet her goals.    Expected Outcomes Patient will complete the program and continue to meet her personal goals.       ITP Comments:     ITP Comments    Row Name 04/30/17 1322 05/15/17 1247         ITP Comments Patient new to program completing 4 sessions. Will continue to monitor for progress.  Patient met with Registered Dietitian to discuss nutrition topics including: Heart healthty eating, heart healthy cooking and making smart choices when shopping; Portion control; weight management; and hydration. Patient attended a group session with the hospital chaplian called [facility] to discuss and share how this recent diagnosis has effected their life         Comments: ITP 30 Day REVIEW Pt is making expected progress toward pulmonary rehab goals after completing 32 sessions. Recommend continued exercise, life style modification, education, and utilization of breathing techniques to increase stamina and strength and decrease shortness of breath  with exertion.

## 2017-09-12 ENCOUNTER — Other Ambulatory Visit: Payer: Self-pay | Admitting: Physician Assistant

## 2017-09-12 DIAGNOSIS — J40 Bronchitis, not specified as acute or chronic: Secondary | ICD-10-CM

## 2017-09-16 ENCOUNTER — Encounter (HOSPITAL_COMMUNITY)
Admission: RE | Admit: 2017-09-16 | Discharge: 2017-09-16 | Disposition: A | Discharge status: Home / Self Care | Payer: Medicare PPO | Source: Ambulatory Visit | Attending: Cardiology | Admitting: Cardiology

## 2017-09-16 DIAGNOSIS — I2721 Secondary pulmonary arterial hypertension: Secondary | ICD-10-CM | POA: Diagnosis not present

## 2017-09-16 NOTE — Progress Notes (Signed)
Daily Session Note  Patient Details  Name: Kelly Donaldson MRN: [id number] Date of Birth: May 31, 1956 Referring Provider:     PULMONARY REHAB OTHER RESP ORIENTATION from 04/17/2017 in [place]  Referring Provider  Dr. [name]      Encounter Date: 09/16/2017  Check In:     Session Check In - 09/16/17 1359      Check-In   Location [facility]   Staff Present [name], MS, EP, [facility], Exercise Physiologist;Antoinne Spadaccini Luther Parody, BS, EP, Exercise Physiologist   Supervising physician immediately available to respond to emergencies See telemetry face sheet for immediately available MD   Medication changes reported     No   Fall or balance concerns reported    No   Warm-up and Cool-down Performed as group-led instruction   Resistance Training Performed Yes   VAD Patient? No     Pain Assessment   Currently in Pain? No/denies   Pain Score 0-No pain   Multiple Pain Sites No      Capillary Blood Glucose: No results found for this or any previous visit (from the past 24 hour(s)).    History  Smoking Status  . Former Smoker  . Packs/day: 0.25  . Years: 17.00  . Types: Cigarettes  . Quit date: 03/02/2015  Smokeless Tobacco  . Never Used    Goals Met:  Independence with exercise equipment Improved SOB with ADL's Using PLB without cueing & demonstrates good technique Exercise tolerated well No report of cardiac concerns or symptoms Strength training completed today  Goals Unmet:  Not Applicable  Comments: Check out 230   Dr. Sinda Du is Medical Director for [facility] Pulmonary Re[facility].

## 2017-09-18 ENCOUNTER — Encounter (HOSPITAL_COMMUNITY): Payer: Medicare PPO

## 2017-09-23 ENCOUNTER — Encounter (HOSPITAL_COMMUNITY)
Admission: RE | Admit: 2017-09-23 | Discharge: 2017-09-23 | Disposition: A | Discharge status: Home / Self Care | Payer: Medicare PPO | Source: Ambulatory Visit | Attending: Cardiology | Admitting: Cardiology

## 2017-09-23 DIAGNOSIS — I2721 Secondary pulmonary arterial hypertension: Secondary | ICD-10-CM | POA: Diagnosis not present

## 2017-09-23 NOTE — Progress Notes (Signed)
Daily Session Note  Patient Details  Name: Kelly Donaldson MRN: [id number] Date of Birth: 09-07-1956 Referring Provider:     PULMONARY REHAB OTHER RESP ORIENTATION from 04/17/2017 in [place]  Referring Provider  Dr. [name]      Encounter Date: 09/23/2017  Check In:     Session Check In - 09/23/17 1343      Check-In   Location [facility]   Staff Present Suzanne Boron, BS, EP, Exercise Physiologist   Supervising physician immediately available to respond to emergencies See telemetry face sheet for immediately available MD   Medication changes reported     No   Fall or balance concerns reported    No   Warm-up and Cool-down Performed as group-led instruction   Resistance Training Performed Yes   VAD Patient? No     Pain Assessment   Currently in Pain? No/denies   Pain Score 0-No pain   Multiple Pain Sites No      Capillary Blood Glucose: No results found for this or any previous visit (from the past 24 hour(s)).      Exercise Prescription Changes - 09/23/17 1200      Response to Exercise   Blood Pressure (Admit) 118/60   Blood Pressure (Exercise) 116/60   Blood Pressure (Exit) 106/64   Heart Rate (Admit) 74 bpm   Heart Rate (Exercise) 84 bpm   Heart Rate (Exit) 69 bpm   Oxygen Saturation (Admit) 94 %   Oxygen Saturation (Exercise) 90 %   Oxygen Saturation (Exit) 94 %   Rating of Perceived Exertion (Exercise) 11   Perceived Dyspnea (Exercise) 1   Duration Progress to 30 minutes of  aerobic without signs/symptoms of physical distress   Intensity THRR unchanged  [phone]     Progression   Progression Continue to progress workloads to maintain intensity without signs/symptoms of physical distress.  [phone]     Resistance Training   Training Prescription Yes   Weight 0   Reps 10-15     Oxygen   Oxygen Continuous   Liters 6     NuStep   Level 4   SPM 91   Minutes 34   METs 2.2     Home Exercise Plan   Plans to continue exercise at Home (comment)   Frequency Add 2 additional days to program exercise sessions.      History  Smoking Status  . Former Smoker  . Packs/day: 0.25  . Years: 17.00  . Types: Cigarettes  . Quit date: 03/02/2015  Smokeless Tobacco  . Never Used    Goals Met:  Independence with exercise equipment Improved SOB with ADL's Using PLB without cueing & demonstrates good technique Exercise tolerated well No report of cardiac concerns or symptoms Strength training completed today  Goals Unmet:  Not Applicable  Comments: Check out 230   Dr. Sinda Du is Medical Director for [facility] Pulmonary Rehab.

## 2017-09-25 ENCOUNTER — Encounter (HOSPITAL_COMMUNITY)
Admission: RE | Admit: 2017-09-25 | Discharge: 2017-09-25 | Disposition: A | Discharge status: Home / Self Care | Payer: Medicare PPO | Source: Ambulatory Visit | Attending: Cardiology | Admitting: Cardiology

## 2017-09-25 DIAGNOSIS — I2721 Secondary pulmonary arterial hypertension: Secondary | ICD-10-CM

## 2017-09-25 NOTE — Progress Notes (Signed)
Daily Session Note  Patient Details  Name: SHANESE RIEMENSCHNEIDER MRN: [id number] Date of Birth: [birth year] Referring Provider:     PULMONARY REHAB OTHER RESP ORIENTATION from 04/17/2017 in [place]  Referring Provider  Dr. [name]      Encounter Date: 09/25/2017  Check In:     Session Check In - 09/25/17 1333      Check-In   Location [facility]   Staff Present [name], MS, EP, [facility], Exercise Physiologist;Pennye Beeghly Luther Parody, BS, EP, Exercise Physiologist;Debra Wynetta Emery, RN, BSN   Supervising physician immediately available to respond to emergencies See telemetry face sheet for immediately available MD   Medication changes reported     No   Fall or balance concerns reported    No   Warm-up and Cool-down Performed as group-led instruction   Resistance Training Performed Yes   VAD Patient? No     Pain Assessment   Currently in Pain? No/denies   Pain Score 0-No pain   Multiple Pain Sites No      Capillary Blood Glucose: No results found for this or any previous visit (from the past 24 hour(s)).    History  Smoking Status  . Former Smoker  . Packs/day: 0.25  . Years: 17.00  . Types: Cigarettes  . Quit date: 03/02/2015  Smokeless Tobacco  . Never Used    Goals Met:  Independence with exercise equipment Improved SOB with ADL's Using PLB without cueing & demonstrates good technique Exercise tolerated well No report of cardiac concerns or symptoms Strength training completed today  Goals Unmet:  Not Applicable  Comments: Check out 230   Dr. Sinda Du is Medical Director for [facility] Pulmonary [facility].

## 2017-09-30 ENCOUNTER — Encounter (HOSPITAL_COMMUNITY)
Admission: RE | Admit: 2017-09-30 | Discharge: 2017-09-30 | Disposition: A | Discharge status: Home / Self Care | Payer: Medicare PPO | Source: Ambulatory Visit | Attending: Cardiology | Admitting: Cardiology

## 2017-09-30 VITALS — Ht 67.0 in | Wt 243.5 lb

## 2017-09-30 DIAGNOSIS — I2721 Secondary pulmonary arterial hypertension: Secondary | ICD-10-CM

## 2017-09-30 NOTE — Progress Notes (Signed)
Daily Session Note  Patient Details  Name: AYAAT JANSMA MRN: [id number] Date of Birth: 02/13/1956 Referring Provider:     PULMONARY REHAB OTHER RESP ORIENTATION from 04/17/2017 in [place]  Referring Provider  Dr. [name]      Encounter Date: 09/30/2017  Check In:     Session Check In - 09/30/17 1359      Check-In   Location [facility]   Staff Present [name], MS, EP, [facility], Exercise Physiologist;Maxen Rowland Luther Parody, BS, EP, Exercise Physiologist   Supervising physician immediately available to respond to emergencies See telemetry face sheet for immediately available MD   Medication changes reported     No   Fall or balance concerns reported    No   Warm-up and Cool-down Performed as group-led instruction   Resistance Training Performed Yes   VAD Patient? No     Pain Assessment   Currently in Pain? No/denies   Pain Score 0-No pain   Multiple Pain Sites No      Capillary Blood Glucose: No results found for this or any previous visit (from the past 24 hour(s)).    History  Smoking Status  . Former Smoker  . Packs/day: 0.25  . Years: 17.00  . Types: Cigarettes  . Quit date: 03/02/2015  Smokeless Tobacco  . Never Used    Goals Met:  Independence with exercise equipment Improved SOB with ADL's Using PLB without cueing & demonstrates good technique Exercise tolerated well No report of cardiac concerns or symptoms Strength training completed today  Goals Unmet:  Not Applicable  Comments: Check out 230   Dr. Sinda Du is Medical Director for [facility] Pulmonary Rehab.

## 2017-10-02 ENCOUNTER — Encounter (HOSPITAL_COMMUNITY): Payer: Medicare PPO

## 2017-10-02 NOTE — Progress Notes (Signed)
Discharge Progress Report  Patient Details  Name: Kelly Donaldson MRN: [id number] Date of Birth: 1956/10/04 Referring Provider:     PULMONARY REHAB OTHER RESP ORIENTATION from 04/17/2017 in [place]  Referring Provider  Dr. [name]       Number of Visits: 36  Reason for Discharge:  Patient reached a stable level of exercise. Patient independent in their exercise. Patient has met program and personal goals.  Smoking History:  History  Smoking Status  . Former Smoker  . Packs/day: 0.25  . Years: 17.00  . Types: Cigarettes  . Quit date: 03/02/2015  Smokeless Tobacco  . Never Used    Diagnosis:  PAH (pulmonary artery hypertension) ([place])  ADL [facility]SD:     Pulmonary Assessment Scores    Row Name 04/17/17 1229 10/02/17 1609       ADL UCSD   ADL Phase Entry Exit    SOB Score total 67 55    Rest 0 0    Walk 8 4    Stairs 5 5    Bath 0 0    Dress 2 0    Shop 3 3      CAT Score   CAT Score 16 11      mMRC Score   mMRC Score 4 2       Initial Exercise Prescription:     Initial Exercise Prescription - 04/17/17 1100      Date of Initial Exercise RX and Referring Provider   Date 04/17/17   Referring Provider Dr. [name]     Oxygen   Oxygen Continuous   Liters 6     Treadmill   MPH 1.4   Grade 0   Minutes 15   METs 2     NuStep   Level 2   SPM 15   Minutes 20   METs 1.8     Prescription Details   Frequency (times per week) 2   Duration Progress to 30 minutes of continuous aerobic without signs/symptoms of physical distress     Intensity   THRR 40-80% of Max Heartrate [phone]   Ratings of Perceived Exertion 11-13   Perceived Dyspnea 0-4     Progression   Progression Continue progressive overload as per policy without signs/symptoms or physical distress.     Resistance Training   Training Prescription Yes   Weight 1   Reps 10-15      Discharge Exercise Prescription (Final Exercise Prescription Changes):      Exercise Prescription Changes - 09/23/17 1200      Response to Exercise   Blood Pressure (Admit) 118/60   Blood Pressure (Exercise) 116/60   Blood Pressure (Exit) 106/64   Heart Rate (Admit) 74 bpm   Heart Rate (Exercise) 84 bpm   Heart Rate (Exit) 69 bpm   Oxygen Saturation (Admit) 94 %   Oxygen Saturation (Exercise) 90 %   Oxygen Saturation (Exit) 94 %   Rating of Perceived Exertion (Exercise) 11   Perceived Dyspnea (Exercise) 1   Duration Progress to 30 minutes of  aerobic without signs/symptoms of physical distress   Intensity THRR unchanged  [phone]     Progression   Progression Continue to progress workloads to maintain intensity without signs/symptoms of physical distress.  [phone]     Resistance Training   Training Prescription Yes   Weight 0   Reps 10-15     Oxygen   Oxygen Continuous   Liters 6     NuStep  Level 4   SPM 91   Minutes 34   METs 2.2     Home Exercise Plan   Plans to continue exercise at Home (comment)   Frequency Add 2 additional days to program exercise sessions.      Functional Capacity:     6 Minute Walk    Row Name 04/17/17 1152 09/30/17 1408 10/01/17 1414     6 Minute Walk   Phase Initial Initial Discharge   Distance 750 feet 850 feet 950 feet   Distance % Change 0 % 0 % 26.67 %   Distance Feet Change  - 0 ft 200 ft   Walk Time 6 minutes 6 minutes 6 minutes   # of Rest Breaks 1 0 0   MPH 1.42 1.6 1.79   METS 2.08 2.23 2.37   RPE _0 Perceived Dyspnea  _1 VO2 Peak 7.44 9.88 8.89   Symptoms No No No   Resting HR 74 bpm 62 bpm 76 bpm   Resting BP 94/50 90/60 104/60   Resting Oxygen Saturation   - 96 % 93 %   Exercise Oxygen Saturation  during 6 min walk  - 91 % 74 %   Max Ex. HR 143 bpm 89 bpm 126 bpm   Max Ex. BP 134/62 118/64 144/70   2 Minute Post BP 106/56 96/60 120/64      Psychological, QOL, Others - Outcomes: PHQ 2/9: Depression screen [facility] 2/9 10/02/2017 08/29/2017 05/28/2017 04/17/2017  04/08/2017  Decreased Interest 0 0 0 0 2  Down, Depressed, Hopeless _2 - 2  PHQ - 2 Score _3 0 4  Altered sleeping 2 - _4 Tired, decreased energy 2 - _5 Change in appetite 2 - _6 Feeling bad or failure about yourself  0 - _7 Trouble concentrating 0 - 0 0 1  Moving slowly or fidgety/restless 0 - 1 0 1  Suicidal thoughts 0 - 0 0 1  PHQ-9 Score 8 - _8 Difficult doing work/chores Not difficult at all - - - -    Quality of Life:     Quality of Life - 10/01/17 1416      Quality of Life Scores   Health/Function Pre 14.91 %   Health/Function Post 16.78 %   Health/Function % Change 12.54 %   Socioeconomic Pre 17.29 %   Socioeconomic Post 18.14 %   Socioeconomic % Change  4.92 %   Psych/Spiritual Pre 19.86 %   Psych/Spiritual Post 18.21 %   Psych/Spiritual % Change -8.31 %   Family Pre 17.1 %   Family Post 16.8 %   Family % Change -1.75 %   GLOBAL Pre 16.69 %   GLOBAL Post 17.34 %   GLOBAL % Change 3.89 %      Personal Goals: Goals established at orientation with interventions provided to work toward goal.     Personal Goals and Risk Factors at Admission - 04/17/17 1257      Core Components/Risk Factors/Patient Goals on Admission    Weight Management Weight Maintenance   Improve shortness of breath with ADL's Yes   Intervention Provide education, individualized exercise plan and daily activity instruction to help decrease symptoms of SOB with activities of daily living.   Expected Outcomes Short Term: Achieves a reduction of symptoms when performing activities of daily living.   Personal Goal Other  Yes   Personal Goal Breathe better, Walk longer without SOB   Intervention Attend program 2 x week and supplement at home 3 x week.    Expected Outcomes Reach personal goals       Personal Goals Discharge:     Goals and Risk Factor Review    Row Name 05/26/17 1329 06/19/17 0742 07/17/17 1337 08/14/17 1543 09/11/17 1609     Core Components/Risk  Factors/Patient Goals Review   Personal Goals Review Weight Management/Obesity;Improve shortness of breath with ADL's  Breathe better; walk longer.  Weight Management/Obesity;Improve shortness of breath with ADL's  Breathe better; walk longer. Weight Management/Obesity;Improve shortness of breath with ADL's;Develop more efficient breathing techniques such as purse lipped breathing and diaphragmatic breathing and practicing self-pacing with activity.  Breathe better; walk longer.  Weight Management/Obesity;Improve shortness of breath with ADL's;Develop more efficient breathing techniques such as purse lipped breathing and diaphragmatic breathing and practicing self-pacing with activity.;Increase knowledge of respiratory medications and ability to use respiratory devices properly. Weight Management/Obesity;Develop more efficient breathing techniques such as purse lipped breathing and diaphragmatic breathing and practicing self-pacing with activity.;Improve shortness of breath with ADL's;Increase knowledge of respiratory medications and ability to use respiratory devices properly.  Breathe better and walk longer.    Review Patient has completed 11 sessions losing 5.3 lbs. She is progressing in the program. Her O2 usage remains the same. She reports no improvement in her SOB but she does feel stronger and better overall. Will continue to monitor for progress.  Patient has completed 16 sessions losing 3.3 lbs. She is progressing and doing well in the program saying her SOB has improved some. Will continue to monitor.  Patient has completed 21 sessions losing 7 lbs. She has done well in the program but has not been able to attend recently d/t a sprained ankle. Will continue to monitor.  Patient has completed 27 sessions 10.8 lbs. She continue to do well in the program. She says she feels much stronger and is breathing better. She is now able to walk up a flight of stairs. Her SOB has improved. She demonstrates  proper pursed lip breathing techniques during sessions. Will continue to monitor. Patient has completed 32 sessions and continues to do well in the program. She says she is breathing better and her SOB has improved. She is able to walk longer and is able to climb stairs now. She feels the program continues to help her meet her goals.    Expected Outcomes Patient will complete the program and continue to work toward meeting her personal goals.  Patient will complete the program and meet her personal goals.  Patient will be able to return and complete the program meeting her personal goals.  Patient will complete the program and continue to meet her personal goals.  Patient will complete the program and continue to meet her personal goals.    [place] Name 10/02/17 1615             Core Components/Risk Factors/Patient Goals Review   Personal Goals Review Weight Management/Obesity;Improve shortness of breath with ADL's;Develop more efficient breathing techniques such as purse lipped breathing and diaphragmatic breathing and practicing self-pacing with activity.  Breathe better; walk longer.        Review Patient graduated with 36 sessions losing 16.3 lbs. She did well in the program. She says she is breathing better and experiencing less SOB with activity. She is able to walk for longer periods and is able to climb stairs. She says  she is able to make her bed and wash her clothes now. She feels better and stronger overall. Her exit walk test improved by 26.6%. Her exit measurements improved in grip strength and remained the same in balance. She plans to join our [employer] program to continue exercising.        Expected Outcomes Patient will continue to exercise in our [place] [facility] program and continue to improve her SOB, breathing, and strength. CR will f/u for 1 year.           Exercise Goals and Review:     Exercise Goals    Row Name 04/17/17 1256             Exercise  Goals   Increase Physical Activity Yes       Intervention Provide advice, education, support and counseling about physical activity/exercise needs.;Develop an individualized exercise prescription for aerobic and resistive training based on initial evaluation findings, risk stratification, comorbidities and participant's personal goals.       Expected Outcomes Achievement of increased cardiorespiratory fitness and enhanced flexibility, muscular endurance and strength shown through measurements of functional capacity and personal statement of participant.       Increase Strength and Stamina Yes       Intervention Provide advice, education, support and counseling about physical activity/exercise needs.;Develop an individualized exercise prescription for aerobic and resistive training based on initial evaluation findings, risk stratification, comorbidities and participant's personal goals.       Expected Outcomes Achievement of increased cardiorespiratory fitness and enhanced flexibility, muscular endurance and strength shown through measurements of functional capacity and personal statement of participant.          Nutrition & Weight - Outcomes:     Pre Biometrics - 04/17/17 1154      Pre Biometrics   Height _0  (1.702 m)   Weight 259 lb 0.7 oz (117.5 kg)   Waist Circumference 43 inches   Hip Circumference 52 inches   Waist to Hip Ratio 0.83 %   BMI (Calculated) 40.7   Triceps Skinfold 31 mm   % Body Fat 48 %   Grip Strength 47.86 kg   Flexibility 0 in   Single Leg Stand 2 seconds         Post Biometrics - 10/01/17 1415       Post  Biometrics   Height _1  (1.702 m)   Weight 243 lb 8 oz (110.5 kg)   Waist Circumference 43 inches   Hip Circumference 51 inches   Waist to Hip Ratio 0.84 %   BMI (Calculated) 38.13   Triceps Skinfold 29 mm   % Body Fat 46.4 %   Grip Strength 50.53 kg   Flexibility 0 in   Single Leg Stand 2 seconds      Nutrition:     Nutrition Therapy &  Goals - 08/14/17 1539      Personal Nutrition Goals   Nutrition Goal For heart healthy choices add >50% of whole grains, make half their plate fruits and vegetables. Discuss the difference between starchy vegetables and leafy greens, and how leafy vegetables provide fiber, helps maintain healthy weight, helps control blood glucose, and lowers cholesterol.  Discuss purchasing fresh or frozen vegetable to reduce sodium and not to add grease, fat or sugar. Consume <18oz of red meat per week. Consume lean cuts of meats and very little of meats high in sodium and nitrates such as pork and lunch meats. Discussed portion control for  all food groups.    Additional Goals? No     Intervention Plan   Intervention Prescribe, educate and counsel regarding individualized specific dietary modifications aiming towards targeted core components such as weight, hypertension, lipid management, diabetes, heart failure and other comorbidities.;Nutrition handout(s) given to patient.   Expected Outcomes Short Term Goal: Understand basic principles of dietary content, such as calories, fat, sodium, cholesterol and nutrients.;Short Term Goal: A plan has been developed with personal nutrition goals set during dietitian appointment.;Long Term Goal: Adherence to prescribed nutrition plan.      Nutrition Discharge:     Nutrition Assessments - 10/02/17 1613      Rate Your Plate Scores   Pre Score 39   Pre Score % 39 %   Post Score 57   Post Score % 57 %   % Change 18 %      Education Questionnaire Score:     Knowledge Questionnaire Score - 10/02/17 1608      Knowledge Questionnaire Score   Pre Score 11/14   Post Score 6/14      Goals reviewed with patient; copy given to patient.

## 2017-10-02 NOTE — Progress Notes (Signed)
Pulmonary Individual Treatment Plan  Patient Details  Name: Kelly Donaldson MRN: [id number] Date of Birth: 02-12-1956 Referring Provider:     PULMONARY REHAB OTHER RESP ORIENTATION from 04/17/2017 in [place]  Referring Provider  Dr. [name]      Initial Encounter Date:    [facility] from 04/17/2017 in [place]  Date  04/17/17  Referring Provider  Dr. [name]      Visit Diagnosis: PAH (pulmonary artery hypertension) ([place])  Patient's Home Medications on Admission:   Current Outpatient Prescriptions:  .  aspirin EC 81 MG tablet, Take 1 tablet (81 mg total) by mouth daily., Disp: 90 tablet, Rfl: 3 .  cefdinir (OMNICEF) 300 MG capsule, TAKE 1 CAPSULE TWICE DAILY, Disp: 20 capsule, Rfl: 0 .  cetirizine (ZYRTEC) 10 MG tablet, Take 1 tablet (10 mg total) by mouth daily. (Patient taking differently: Take 10 mg by mouth at bedtime. ), Disp: 90 tablet, Rfl: 3 .  citalopram (CELEXA) 40 MG tablet, TAKE ONE TABLET BY MOUTH ONCE DAILY FOR 30 DAYS, Disp: 30 tablet, Rfl: 2 .  HYDROcodone-acetaminophen (NORCO) 10-325 MG tablet, Take 1-2 tablets by mouth every 6 (six) hours as needed., Disp: 240 tablet, Rfl: 0 .  HYDROcodone-acetaminophen (NORCO) 10-325 MG tablet, Take 1-2 tablets by mouth every 6 (six) hours as needed., Disp: 240 tablet, Rfl: 0 .  HYDROcodone-homatropine (HYCODAN) 5-1.5 MG/5ML syrup, Take 5-10 mLs by mouth every 6 (six) hours as needed., Disp: 240 mL, Rfl: 0 .  Ipratropium-Albuterol (COMBIVENT RESPIMAT) 20-100 MCG/ACT AERS respimat, Inhale 1 puff into the lungs every 6 (six) hours as needed for wheezing., Disp: 3 Inhaler, Rfl: 3 .  ipratropium-albuterol (DUONEB) 0.5-2.5 (3) MG/3ML SOLN, Take 3 mLs by nebulization 4 (four) times daily as needed. (Patient taking differently: Take 3 mLs by nebulization 4 (four) times daily as needed (for wheezing/shortness of breath). ), Disp: 360 mL, Rfl: 11 .  Macitentan (OPSUMIT)  10 MG TABS, Take 1 tablet (10 mg total) by mouth daily., Disp: 30 tablet, Rfl: 11 .  meloxicam (MOBIC) 7.5 MG tablet, Take 7.5 mg by mouth at bedtime., Disp: , Rfl:  .  OXYGEN, 2.5 lpm with sleep and 4 lpm with exertion  [employer], Disp: , Rfl:  .  potassium chloride SA (K-DUR,KLOR-CON) 20 MEQ tablet, Take 1 tablet (20 mEq total) by mouth 2 (two) times daily., Disp: 180 tablet, Rfl: 3 .  pravastatin (PRAVACHOL) 20 MG tablet, Take 1 tablet (20 mg total) by mouth daily. (Patient taking differently: Take 20 mg by mouth at bedtime. ), Disp: 90 tablet, Rfl: 3 .  Riociguat (ADEMPAS) 2 MG TABS, Take 2 mg by mouth 3 (three) times daily., Disp: 90 tablet, Rfl: 11 .  Selexipag 200 MCG TABS, Take 1 tablet (200 mcg total) by mouth 2 (two) times daily., Disp: 60 tablet, Rfl: 6 .  torsemide (DEMADEX) 20 MG tablet, Take 2 tablets (40 mg total) by mouth daily. (Patient taking differently: Take 40 mg by mouth 2 (two) times daily. ), Disp: 180 tablet, Rfl: 3 .  Vitamin D, Ergocalciferol, (DRISDOL) 50000 units CAPS capsule, TAKE ONE CAPSULE BY MOUTH ONCE WEEKLY FOR 30 DAYS (Patient taking differently: Take 50,000 Units by mouth every Monday. ), Disp: 12 capsule, Rfl: 3 .  zolpidem (AMBIEN) 5 MG tablet, Take 1 tablet (5 mg total) by mouth at bedtime., Disp: 30 tablet, Rfl: 2  Past Medical History: Past Medical History:  Diagnosis Date  . Allergy   . Depression   .  GERD (gastroesophageal reflux disease)   . Hyperlipidemia   . Oxygen dependent   . Shortness of breath dyspnea   . Varicose veins     Tobacco Use: History  Smoking Status  . Former Smoker  . Packs/day: 0.25  . Years: 17.00  . Types: Cigarettes  . Quit date: 03/02/2015  Smokeless Tobacco  . Never Used    Labs: Recent Review Flowsheet Data    Labs for ITP Cardiac and Pulmonary Rehab Latest Ref Rng & Units 06/13/2017 06/13/2017   HCO3 20.0 - 28.0 mmol/L 27.9 28.6(H)   TCO2 0 - 100 mmol/L 29 30   O2SAT % 75.0 73.0      Capillary Blood  Glucose: No results found for: GLUCAP   Pulmonary Assessment Scores:     Pulmonary Assessment Scores    Row Name 04/17/17 1229 10/02/17 1609       ADL UCSD   ADL Phase Entry Exit    SOB Score total 67 55    Rest 0 0    Walk 8 4    Stairs 5 5    Bath 0 0    Dress 2 0    Shop 3 3      CAT Score   CAT Score 16 11      mMRC Score   mMRC Score 4 2       Pulmonary Function Assessment:     Pulmonary Function Assessment - 04/17/17 1226      Pulmonary Function Tests   FVC% 76 %   FEV1% 75 %   FEV1/FVC Ratio 97   DLCO% 26 %     Initial Spirometry Results   FVC% 76 %   FEV1% 74 %   FEV1/FVC Ratio 96     Post Bronchodilator Spirometry Results   FVC% 76 %   FEV1% 74 %   FEV1/FVC Ratio 96     Breath   Bilateral Breath Sounds Clear   Shortness of Breath Yes      Exercise Target Goals:    Exercise Program Goal: Individual exercise prescription set with THRR, safety & activity barriers. Participant demonstrates ability to understand and report RPE using BORG scale, to self-measure pulse accurately, and to acknowledge the importance of the exercise prescription.  Exercise Prescription Goal: Starting with aerobic activity 30 plus minutes a day, 3 days per week for initial exercise prescription. Provide home exercise prescription and guidelines that participant acknowledges understanding prior to discharge.  Activity Barriers & Risk Stratification:   6 Minute Walk:     6 Minute Walk    Row Name 04/17/17 1152 09/30/17 1408 10/01/17 1414     6 Minute Walk   Phase Initial Initial Discharge   Distance 750 feet 850 feet 950 feet   Distance % Change 0 % 0 % 26.67 %   Distance Feet Change  - 0 ft 200 ft   Walk Time 6 minutes 6 minutes 6 minutes   # of Rest Breaks 1 0 0   MPH 1.42 1.6 1.79   METS 2.08 2.23 2.37   RPE 15 9 13   Perceived Dyspnea  16 10 12   VO2 Peak 7.44 9.88 8.89   Symptoms No No No   Resting HR 74 bpm 62 bpm 76 bpm   Resting BP 94/50 90/60  104/60   Resting Oxygen Saturation   - 96 % 93 %   Exercise Oxygen Saturation  during 6 min walk  - 91 % 74 %     Max Ex. HR 143 bpm 89 bpm 126 bpm   Max Ex. BP 134/62 118/64 144/70   2 Minute Post BP 106/56 96/60 120/64      Oxygen Initial Assessment:     Oxygen Initial Assessment - 04/17/17 1235      Home Oxygen   Home Oxygen Device Home Concentrator;Portable Concentrator   Sleep Oxygen Prescription None   Home Exercise Oxygen Prescription Continuous   Liters per minute 4   Home at Rest Exercise Oxygen Prescription Continuous   Liters per minute 2   Compliance with Home Oxygen Use Yes     Initial 6 min Walk   Oxygen Used Continuous   Liters per minute 4   Resting Oxygen Saturation  94 %   Exercise Oxygen Saturation  during 6 min walk 72 %  Had patient stop to rest, increase oxygen to 6 liters, SaO2 increased to 90 mg/dl. then test was resumed and patient finished the walk test.      Program Oxygen Prescription   Program Oxygen Prescription Continuous     Intervention   Short Term Goals To learn and demonstrate proper purse lipped breathing techniques or other breathing techniques.;To learn and exhibit compliance with exercise, home and travel O2 prescription;To Learn and understand importance of maintaining oxygen saturations>88%   Long  Term Goals Maintenance of O2 saturations>88%      Oxygen Re-Evaluation:     Oxygen Re-Evaluation    Row Name 05/26/17 1327 06/19/17 0748 07/17/17 1329 08/14/17 1541 09/11/17 1605     Program Oxygen Prescription   Program Oxygen Prescription _0    Liters per minute _1 FiO2% 91 92 91 91 90   Comments  -  - Patient uses 3-5 l/min during exercise session depending on her SOB and her O2 Sat levels.   - Patient continues to use 3-5 l/min during exercise depending on her O2 Sat.      Home Oxygen   Home Oxygen Device Home Concentrator Home Concentrator Home  Concentrator;Portable Concentrator Home Concentrator;Portable Concentrator Home Concentrator;E-Tanks   Sleep Oxygen Prescription _2    Liters per minute _3 Home Exercise Oxygen Prescription _4    Liters per minute _5 Home at Rest Exercise Oxygen Prescription _6    Liters per minute _7 Compliance with Home Oxygen Use _8      Goals/Expected Outcomes   Short Term Goals To Learn and understand importance of maintaining oxygen saturations>88% To learn and exhibit compliance with exercise, home and travel O2 prescription To learn and exhibit compliance with exercise, home and travel O2 prescription;To Learn and understand importance of maintaining oxygen saturations>88%;To learn and understand importance of monitoring SPO2 with pulse oximeter and demonstrate accurate use of the pulse oximeter.;To learn and demonstrate proper purse lipped breathing techniques or other breathing techniques. To learn and exhibit compliance with exercise, home and travel O2 prescription;To learn and understand importance of monitoring SPO2 with pulse oximeter and demonstrate accurate use of the pulse oximeter.;To learn and understand importance of maintaining oxygen saturations>88%;To learn and demonstrate proper pursed lip breathing techniques or other breathing techniques. To learn and exhibit compliance with exercise, home and travel O2 prescription;To learn and understand importance of monitoring SPO2 with pulse oximeter and demonstrate accurate use of the pulse oximeter.;To learn and understand  importance of maintaining oxygen saturations>88%;To learn and demonstrate proper pursed lip breathing techniques or other breathing techniques.;To learn and demonstrate proper use of respiratory medications   Long  Term Goals Maintenance  of O2 saturations>88% Exhibits compliance with exercise, home and travel O2 prescription Verbalizes importance of monitoring SPO2 with pulse oximeter and return demonstration;Maintenance of O2 saturations>88%;Exhibits proper breathing techniques, such as purse lipped breathing or other method taught during program session Exhibits compliance with exercise, home and travel O2 prescription;Verbalizes importance of monitoring SPO2 with pulse oximeter and return demonstration;Maintenance of O2 saturations>88%;Exhibits proper breathing techniques, such as pursed lip breathing or other method taught during program session Exhibits compliance with exercise, home and travel O2 prescription;Verbalizes importance of monitoring SPO2 with pulse oximeter and return demonstration;Maintenance of O2 saturations>88%;Exhibits proper breathing techniques, such as pursed lip breathing or other method taught during program session;Compliance with respiratory medication   Comments Patient is compliant with her O2 prescription. Her O2 saturation is 91-97 at PR. Her O2 runs at 2 L/min.   - Patient compliant with O2 prescription. Patient contiunes to be compliant with O2 prescription. Patient continues to be compliant with her O2 prescription. She demonstrates proper pursed lip breathing during PR and says she is compliant with all of her medication. She verbalizes understanding of keeping her O2 Sat levels above 88%. Will continue to monitor.    Goals/Expected Outcomes Patient will continue to be compliant with her O2 prescription and continue to maintain her O2 saturation levels at 91-97.  Patient will continue to be compliant with her O2 prescription. Patient will continue to be compliant with O2 prescription. Patient is compliant with O2 prescription. Patient will continue to meet her respiratory goals.       Oxygen Discharge (Final Oxygen Re-Evaluation):     Oxygen Re-Evaluation - 09/11/17 1605      Program Oxygen  Prescription   Program Oxygen Prescription Continuous   Liters per minute 3   FiO2% 90   Comments Patient continues to use 3-5 l/min during exercise depending on her O2 Sat.      Home Oxygen   Home Oxygen Device Home Concentrator;E-Tanks   Sleep Oxygen Prescription Continuous   Liters per minute 2   Home Exercise Oxygen Prescription Continuous   Liters per minute 3   Home at Rest Exercise Oxygen Prescription Continuous   Liters per minute 2   Compliance with Home Oxygen Use Yes     Goals/Expected Outcomes   Short Term Goals To learn and exhibit compliance with exercise, home and travel O2 prescription;To learn and understand importance of monitoring SPO2 with pulse oximeter and demonstrate accurate use of the pulse oximeter.;To learn and understand importance of maintaining oxygen saturations>88%;To learn and demonstrate proper pursed lip breathing techniques or other breathing techniques.;To learn and demonstrate proper use of respiratory medications   Long  Term Goals Exhibits compliance with exercise, home and travel O2 prescription;Verbalizes importance of monitoring SPO2 with pulse oximeter and return demonstration;Maintenance of O2 saturations>88%;Exhibits proper breathing techniques, such as pursed lip breathing or other method taught during program session;Compliance with respiratory medication   Comments Patient continues to be compliant with her O2 prescription. She demonstrates proper pursed lip breathing during PR and says she is compliant with all of her medication. She verbalizes understanding of keeping her O2 Sat levels above 88%. Will continue to monitor.    Goals/Expected Outcomes Patient will continue to meet her respiratory goals.       Initial Exercise Prescription:       Initial Exercise Prescription - 04/17/17 1100      Date of Initial Exercise RX and Referring Provider   Date 04/17/17   Referring Provider Dr. [name]     Oxygen   Oxygen Continuous   Liters 6      Treadmill   MPH 1.4   Grade 0   Minutes 15   METs 2     NuStep   Level 2   SPM 15   Minutes 20   METs 1.8     Prescription Details   Frequency (times per week) 2   Duration Progress to 30 minutes of continuous aerobic without signs/symptoms of physical distress     Intensity   THRR 40-80% of Max Heartrate [phone]   Ratings of Perceived Exertion 11-13   Perceived Dyspnea 0-4     Progression   Progression Continue progressive overload as per policy without signs/symptoms or physical distress.     Resistance Training   Training Prescription Yes   Weight 1   Reps 10-15      Perform Capillary Blood Glucose checks as needed.  Exercise Prescription Changes:      Exercise Prescription Changes    Row Name 04/28/17 1400 05/13/17 1500 05/22/17 0800 06/12/17 0700 07/02/17 1400     Response to Exercise   Blood Pressure (Admit) 122/80 116/58 100/56 106/56 150/62   Blood Pressure (Exercise) 136/82 138/72 118/68 124/66 100/60   Blood Pressure (Exit) 92/60 124/68 104/66 104/60 98/58   Heart Rate (Admit) 71 bpm 72 bpm 76 bpm 65 bpm 74 bpm   Heart Rate (Exercise) 92 bpm 76 bpm 86 bpm 111 bpm 70 bpm   Heart Rate (Exit) 72 bpm 93 bpm 72 bpm 77 bpm 65 bpm   Oxygen Saturation (Admit) 92 % 94 % 90 % 93 % 93 %   Oxygen Saturation (Exercise) 90 % 90 % 95 % 90 % 92 %   Oxygen Saturation (Exit) 97 % 94 % 94 % 94 % 94 %   Rating of Perceived Exertion (Exercise) _0 Perceived Dyspnea (Exercise) _1 Duration Progress to 30 minutes of  aerobic without signs/symptoms of physical distress Progress to 30 minutes of  aerobic without signs/symptoms of physical distress Progress to 30 minutes of  aerobic without signs/symptoms of physical distress Progress to 30 minutes of  aerobic without signs/symptoms of physical distress Progress to 30 minutes of  aerobic without signs/symptoms of physical distress   Intensity THRR unchanged THRR unchanged THRR unchanged THRR  unchanged THRR unchanged     Progression   Progression Continue to progress workloads to maintain intensity without signs/symptoms of physical distress. Continue to progress workloads to maintain intensity without signs/symptoms of physical distress. Continue to progress workloads to maintain intensity without signs/symptoms of physical distress. Continue to progress workloads to maintain intensity without signs/symptoms of physical distress. Continue to progress workloads to maintain intensity without signs/symptoms of physical distress.     Resistance Training   Training Prescription _2    Weight 1 1 0 0 0   Reps 10-15 10-15 10-15 10-15 10-15     Oxygen   Oxygen _3    Liters _4 Treadmill   MPH 1.4 1.4 1.5 1.6 1.6   Grade 0 0 0 0 0   Minutes _5 METs 2 2 2.15 2.2 2.2  NuStep   Level 2 3 3 3 1   SPM 16 17 25 20 19   Minutes 20 20 20 20 20   METs 3.73 3.74 3.73 2.8 1.9     Home Exercise Plan   Plans to continue exercise at Home (comment) Home (comment) Home (comment) Home (comment) Home (comment)   Frequency Add 2 additional days to program exercise sessions. Add 2 additional days to program exercise sessions. Add 2 additional days to program exercise sessions. Add 2 additional days to program exercise sessions. Add 2 additional days to program exercise sessions.   Row Name 07/23/17 1400 08/06/17 1400 08/26/17 1200 09/05/17 1400 09/23/17 1200     Response to Exercise   Blood Pressure (Admit) 100/58 110/66 104/54 106/58 118/60   Blood Pressure (Exercise) 112/62 120/68 118/60 124/66 116/60   Blood Pressure (Exit) 98/60 110/64 100/58 106/64 106/64   Heart Rate (Admit) 67 bpm 71 bpm 68 bpm 74 bpm 74 bpm   Heart Rate (Exercise) 80 bpm 81 bpm 85 bpm 92 bpm 84 bpm   Heart Rate (Exit) 65 bpm 63 bpm 70 bpm 71 bpm 69 bpm   Oxygen Saturation (Admit) 92 % 100 % 100 % 96 % 94 %   Oxygen Saturation  (Exercise) 90 % 89 % 90 % 90 % 90 %   Oxygen Saturation (Exit) 94 % 98 % 99 % 95 % 94 %   Rating of Perceived Exertion (Exercise) 11 11 11 10 11   Perceived Dyspnea (Exercise) 11 11 11 10 1   Duration Progress to 30 minutes of  aerobic without signs/symptoms of physical distress Progress to 30 minutes of  aerobic without signs/symptoms of physical distress Progress to 30 minutes of  aerobic without signs/symptoms of physical distress Progress to 30 minutes of  aerobic without signs/symptoms of physical distress Progress to 30 minutes of  aerobic without signs/symptoms of physical distress   Intensity THRR unchanged THRR unchanged THRR New THRR New  [id number] THRR unchanged  [id number]     Progression   Progression Continue to progress workloads to maintain intensity without signs/symptoms of physical distress. Continue to progress workloads to maintain intensity without signs/symptoms of physical distress. Continue to progress workloads to maintain intensity without signs/symptoms of physical distress.  [phone] Continue to progress workloads to maintain intensity without signs/symptoms of physical distress.  [phone] Continue to progress workloads to maintain intensity without signs/symptoms of physical distress.  [phone]     Resistance Training   Training Prescription Yes Yes Yes Yes Yes   Weight 0 0 0 0 0   Reps 10-15 10-15 10-15 10-15 10-15     Oxygen   Oxygen Continuous Continuous Continuous Continuous Continuous   Liters 6 6 6 6 6     NuStep   Level 3 3 3 4 4   SPM 90 95 96 87 91   Minutes 34 34 34 34 34   METs 2.1 2.1 2.1 2 2.2     Home Exercise Plan   Plans to continue exercise at Home (comment) Home (comment) Home (comment) Home (comment) Home (comment)   Frequency Add 2 additional days to program exercise sessions. Add 2 additional days to program exercise sessions. Add 2 additional days to program exercise sessions. Add 2 additional days to program exercise  sessions. Add 2 additional days to program exercise sessions.      Exercise Comments:      Exercise Comments    Row Name 04/28/17 1449 05/13/17 1532 05/22/17 0819   06/12/17 0739 07/02/17 1416   Exercise Comments Patient has just recently started PR and will be progressed in time.  Patient is doing well in PR. Patient is doing well in PR.  Patient is doing well in PR.  Patient has not been progressed due to health concerns   [place] Name 07/23/17 1412 08/06/17 1438 08/26/17 1251 09/05/17 1457 09/23/17 1237   Exercise Comments Patient has progressed on the Nustep but has been take off of teh treadmill for now due to a sprained foot/ankle.  Patient is doing well in Pr. she is maintaining her levels on the Nustep but has not increased in difficulty due to a foot injury.  Patient is doing well in PR and is maintaining her levels and watts. Patient is doing well in PR she has increased he level on her Nustep. She plans on joining maintenance when she completes the [facility] program.  Patient is doing well in PR and is maintaining her levels on the Nustep.      Exercise Goals and Review:      Exercise Goals    Row Name 04/17/17 1256             Exercise Goals   Increase Physical Activity Yes       Intervention Provide advice, education, support and counseling about physical activity/exercise needs.;Develop an individualized exercise prescription for aerobic and resistive training based on initial evaluation findings, risk stratification, comorbidities and participant's personal goals.       Expected Outcomes Achievement of increased cardiorespiratory fitness and enhanced flexibility, muscular endurance and strength shown through measurements of functional capacity and personal statement of participant.       Increase Strength and Stamina Yes       Intervention Provide advice, education, support and counseling about physical activity/exercise needs.;Develop an individualized exercise prescription  for aerobic and resistive training based on initial evaluation findings, risk stratification, comorbidities and participant's personal goals.       Expected Outcomes Achievement of increased cardiorespiratory fitness and enhanced flexibility, muscular endurance and strength shown through measurements of functional capacity and personal statement of participant.          Exercise Goals Re-Evaluation :     Exercise Goals Re-Evaluation    Row Name 05/26/17 1337 06/19/17 0744 07/17/17 1339 08/14/17 1546 08/26/17 1251     Exercise Goal Re-Evaluation   Exercise Goals Review Increase Physical Activity;Increase Strenth and Stamina Increase Physical Activity;Increase Strenth and Stamina Increase Physical Activity;Increase Strenth and Stamina Increase Physical Activity;Increase Strength and Stamina Increase Physical Activity;Increase Strength and Stamina;Knowledge and understanding of Target Heart Rate Range (THRR)   Comments After completing 11 sessions, patient is progressing with some increased strength and stamina. She says she has not increased her activity but feels the program is benefiting her.  Patient has completed 16 sessions with progression in her treadmill speed and nustep level. She says she is getting stronger and the program is helping her meet her goals. Will continue to monitor.  Patient has completed 21 sessions. She has not been progressed some with increased treadmill speed since last 30 day review. She has had some issues with hypotension and now has a sprained ankle that has prevented her attending. Will continue to monitor.  Patient contiunes to do well in the program. She says she is getting stronger with more endurance. Will continue to monitor. Patient is doing well in PR and is maintaining her levels and watts.   Expected Outcomes Patient will complete the program continued  increased strength, stamina, and activity.  Patient will complete the program with increased strength, stamina,  and activity. Patient will be able to return to the program and complete with increased strength, stamina, and activity. Patient will complete the program and continue to meet her exercise goals.  Patient wants to  breathe better and have the ability to walk longer   Row Name 09/05/17 1456             Exercise Goal Re-Evaluation   Exercise Goals Review Increase Physical Activity;Increase Strength and Stamina;Knowledge and understanding of Target Heart Rate Range (THRR)       Comments Patient is doing well in PR she has increased he level on her Nustep. 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        Expected Outcomes Patient's goals are to walk longer and to breathe better.           Discharge Exercise Prescription (Final Exercise Prescription Changes):     Exercise Prescription Changes - 09/23/17 1200      Response to Exercise   Blood Pressure (Admit) 118/60   Blood Pressure (Exercise) 116/60   Blood Pressure (Exit) 106/64   Heart Rate (Admit) 74 bpm   Heart Rate (Exercise) 84 bpm   Heart Rate (Exit) 69 bpm   Oxygen Saturation (Admit) 94 %   Oxygen Saturation (Exercise) 90 %   Oxygen Saturation (Exit) 94 %   Rating of Perceived Exertion (Exercise) 11   Perceived Dyspnea (Exercise) 1   Duration Progress to 30 minutes of  aerobic without signs/symptoms of physical distress   Intensity THRR unchanged  [id number]     Progression   Progression Continue to progress workloads to maintain intensity without signs/symptoms of physical distress.  [phone]     Resistance Training   Training Prescription Yes   Weight 0   Reps 10-15     Oxygen   Oxygen Continuous   Liters 6     NuStep   Level 4   SPM 91   Minutes 34   METs 2.2     Home Exercise Plan   Plans to continue exercise at Home (comment)   Frequency Add 2 additional days to program exercise sessions.      Nutrition:  Target Goals: Understanding of nutrition guidelines,  daily intake of sodium <1500mg, cholesterol <200mg, calories 30% from fat and 7% or less from saturated fats, daily to have 5 or more servings of fruits and vegetables.  Biometrics:     Pre Biometrics - 04/17/17 1154      Pre Biometrics   Height 5' 7" (1.702 m)   Weight 259 lb 0.7 oz (117.5 kg)   Waist Circumference 43 inches   Hip Circumference 52 inches   Waist to Hip Ratio 0.83 %   BMI (Calculated) 40.7   Triceps Skinfold 31 mm   % Body Fat 48 %   Grip Strength 47.86 kg   Flexibility 0 in   Single Leg Stand 2 seconds         Post Biometrics - 10/01/17 1415       Post  Biometrics   Height 5' 7" (1.702 m)   Weight 243 lb 8 oz (110.5 kg)   Waist Circumference 43 inches   Hip Circumference 51 inches   Waist to Hip Ratio 0.84 %   BMI (Calculated) 38.13   Triceps Skinfold 29 mm   % Body Fat 46.4 %   Grip Strength 50.53 kg     Flexibility 0 in   Single Leg Stand 2 seconds      Nutrition Therapy Plan and Nutrition Goals:     Nutrition Therapy & Goals - 08/14/17 1539      Personal Nutrition Goals   Nutrition Goal For heart healthy choices add >50% of whole grains, make half their plate fruits and vegetables. Discuss the difference between starchy vegetables and leafy greens, and how leafy vegetables provide fiber, helps maintain healthy weight, helps control blood glucose, and lowers cholesterol.  Discuss purchasing fresh or frozen vegetable to reduce sodium and not to add grease, fat or sugar. Consume <18oz of red meat per week. Consume lean cuts of meats and very little of meats high in sodium and nitrates such as pork and lunch meats. Discussed portion control for all food groups.    Additional Goals? No     Intervention Plan   Intervention Prescribe, educate and counsel regarding individualized specific dietary modifications aiming towards targeted core components such as weight, hypertension, lipid management, diabetes, heart failure and other comorbidities.;Nutrition  handout(s) given to patient.   Expected Outcomes Short Term Goal: Understand basic principles of dietary content, such as calories, fat, sodium, cholesterol and nutrients.;Short Term Goal: A plan has been developed with personal nutrition goals set during dietitian appointment.;Long Term Goal: Adherence to prescribed nutrition plan.      Nutrition Discharge: Rate Your Plate Scores:     Nutrition Assessments - 10/02/17 1613      Rate Your Plate Scores   Pre Score 39   Pre Score % 39 %   Post Score 57   Post Score % 57 %   % Change 18 %      Nutrition Goals Re-Evaluation:     Nutrition Goals Re-Evaluation    Row Name 08/14/17 1540 09/11/17 1607           Goals   Current Weight 248 lb 8 oz (112.7 kg) 246 lb 12.8 oz (111.9 kg)      Nutrition Goal For heart healthy choices add >50% of whole grains, make half their plate fruits and vegetables. Discuss the difference between starchy vegetables and leafy greens, and how leafy vegetables provide fiber, helps maintain healthy weight, helps control blood glucose, and lowers cholesterol.  Discuss purchasing fresh or frozen vegetable to reduce sodium and not to add grease, fat or sugar. Consume <18oz of red meat per week. Consume lean cuts of meats and very little of meats high in sodium and nitrates such as pork and lunch meats. Discussed portion control for all food groups.  For heart healthy choices add >50% of whole grains, make half their plate fruits and vegetables. Discuss the difference between starchy vegetables and leafy greens, and how leafy vegetables provide fiber, helps maintain healthy weight, helps control blood glucose, and lowers cholesterol.  Discuss purchasing fresh or frozen vegetable to reduce sodium and not to add grease, fat or sugar. Consume <18oz of red meat per week. Consume lean cuts of meats and very little of meats high in sodium and nitrates such as pork and lunch meats. Discussed portion control for all food groups.        Comment Patient continues to work towards meeting her nutrition goals.  Patient has completed 32 sessions losing 12.2 lbs. She says she is eating smaller portions and feels she is meeting her nutrition goals. Will continue to monitor for progress.       Expected Outcome Patient will meet her nutrition goals.  Patient will   continue to meet her nutrition goals.          Nutrition Goals Discharge (Final Nutrition Goals Re-Evaluation):     Nutrition Goals Re-Evaluation - 09/11/17 1607      Goals   Current Weight 246 lb 12.8 oz (111.9 kg)   Nutrition Goal For heart healthy choices add >50% of whole grains, make half their plate fruits and vegetables. Discuss the difference between starchy vegetables and leafy greens, and how leafy vegetables provide fiber, helps maintain healthy weight, helps control blood glucose, and lowers cholesterol.  Discuss purchasing fresh or frozen vegetable to reduce sodium and not to add grease, fat or sugar. Consume <18oz of red meat per week. Consume lean cuts of meats and very little of meats high in sodium and nitrates such as pork and lunch meats. Discussed portion control for all food groups.    Comment Patient has completed 32 sessions losing 12.2 lbs. She says she is eating smaller portions and feels she is meeting her nutrition goals. Will continue to monitor for progress.    Expected Outcome Patient will continue to meet her nutrition goals.       Psychosocial: Target Goals: Acknowledge presence or absence of significant depression and/or stress, maximize coping skills, provide positive support system. Participant is able to verbalize types and ability to use techniques and skills needed for reducing stress and depression.  Initial Review & Psychosocial Screening:     Initial Psych Review & Screening - 04/17/17 1504      Initial Review   Current issues with Current Depression;History of Depression     Family Dynamics   Good Support System? Yes      Barriers   Psychosocial barriers to participate in program Psychosocial barriers identified (see note)  QOL overal score 16.69. Discussed counseling. Patient stated she did not feel she needed it at this time.      Screening Interventions   Interventions Encouraged to exercise      Quality of Life Scores:     Quality of Life - 10/01/17 1416      Quality of Life Scores   Health/Function Pre 14.91 %   Health/Function Post 16.78 %   Health/Function % Change 12.54 %   Socioeconomic Pre 17.29 %   Socioeconomic Post 18.14 %   Socioeconomic % Change  4.92 %   Psych/Spiritual Pre 19.86 %   Psych/Spiritual Post 18.21 %   Psych/Spiritual % Change -8.31 %   Family Pre 17.1 %   Family Post 16.8 %   Family % Change -1.75 %   GLOBAL Pre 16.69 %   GLOBAL Post 17.34 %   GLOBAL % Change 3.89 %      PHQ-9: Recent Review Flowsheet Data    Depression screen PHQ 2/9 10/02/2017 08/29/2017 05/28/2017 04/17/2017 04/08/2017   Decreased Interest 0 0 0 0 2   Down, Depressed, Hopeless 2 1 2 - 2   PHQ - 2 Score 2 1 2 0 4   Altered sleeping 2 - 2 2 1   Tired, decreased energy 2 - 2 2 1   Change in appetite 2 - 1 1 1   Feeling bad or failure about yourself  0 - 1 1 1   Trouble concentrating 0 - 0 0 1   Moving slowly or fidgety/restless 0 - 1 0 1   Suicidal thoughts 0 - 0 0 1   PHQ-9 Score 8 - 9 6 11   Difficult doing work/chores Not difficult at all - - - -       Interpretation of Total Score  Total Score Depression Severity:  1-4 = Minimal depression, 5-9 = Mild depression, 10-14 = Moderate depression, 15-19 = Moderately severe depression, 20-27 = Severe depression   Psychosocial Evaluation and Intervention:     Psychosocial Evaluation - 10/02/17 1628      Discharge Psychosocial Assessment & Intervention   Comments Patient's continues to be treated for depression with Citalopram 40 mg daily. She says she feels her depression is managed. Her QOL score improved by 3.89% at 17.34% and her PHQ-9  score was 7 initial and 8 at discharge. No additional psychosocial issues identified at discharge.       Psychosocial Re-Evaluation:     Psychosocial Re-Evaluation    Row Name 05/26/17 1332 06/19/17 0746 07/17/17 1341 08/14/17 1548 09/11/17 1611     Psychosocial Re-Evaluation   Current issues with Current Depression;History of Depression Current Depression Current Depression Current Depression Current Depression   Comments Patient's initial QOL score was 16.69 and her PHQ-9 score was 7. She is currently being treated with Celexa 40 mg. She is not interested in counseling but feels her depression is managed on medication.  Patient's depression is managed with citalopram 40 mg daily.  Patient's depression continues to be managed with citalopram 40 mg daily.  Patient's depression continues to be managed with citalopram 40 mg daily. Depression continues to be managed with Citalopram.    Expected Outcomes Patient's QOL and PHQ-9 scores will improve or remain the same at discharge.  Patient's depression will be managed at discharge. Her PHQ-9 and QOL scores will improve or remain the same at discharge.  Patient will continue treatment for depression and have improved PHQ-9 and QOL scores at discharge.  Patient's depression will be controlled at discharge with no other psychosocial issues identified.  Patient's depression will continued to be managed at discharge with no additional psychosocial barriers identified.    Interventions Relaxation education;Stress management education;Encouraged to attend Pulmonary Rehabilitation for the exercise Encouraged to attend Pulmonary Rehabilitation for the exercise;Relaxation education;Stress management education Encouraged to attend Pulmonary Rehabilitation for the exercise;Relaxation education;Stress management education Encouraged to attend Pulmonary Rehabilitation for the exercise;Relaxation education;Stress management education Encouraged to attend Pulmonary  Rehabilitation for the exercise;Relaxation education;Stress management education   Continue Psychosocial Services  Follow up required by staff Follow up required by staff Follow up required by staff Follow up required by staff Follow up required by staff      Psychosocial Discharge (Final Psychosocial Re-Evaluation):     Psychosocial Re-Evaluation - 09/11/17 1611      Psychosocial Re-Evaluation   Current issues with Current Depression   Comments Depression continues to be managed with Citalopram.    Expected Outcomes Patient's depression will continued to be managed at discharge with no additional psychosocial barriers identified.    Interventions Encouraged to attend Pulmonary Rehabilitation for the exercise;Relaxation education;Stress management education   Continue Psychosocial Services  Follow up required by staff       Education: Education Goals: Education classes will be provided on a weekly basis, covering required topics. Participant will state understanding/return demonstration of topics presented.  Learning Barriers/Preferences:     Learning Barriers/Preferences - 04/17/17 1159      Learning Barriers/Preferences   Learning Barriers None   Learning Preferences Verbal Instruction;Individual Instruction      Education Topics: How Lungs Work and Diseases: - Discuss the anatomy of the lungs and diseases that can affect the lungs, such as COPD.   PULMONARY REHAB OTHER RESPIRATORY from 09/25/2017 in   [place] [facility]  Date  07/10/17  Educator  Gc      Exercise: -Discuss the importance of exercise, FITT principles of exercise, normal and abnormal responses to exercise, and how to exercise safely.   Environmental Irritants: -Discuss types of environmental irritants and how to limit exposure to environmental irritants.   PULMONARY REHAB OTHER RESPIRATORY from 09/25/2017 in [place] [facility]  Date  04/24/17  Educator  GC       Meds/Inhalers and oxygen: - Discuss respiratory medications, definition of an inhaler and oxygen, and the proper way to use an inhaler and oxygen.   PULMONARY REHAB OTHER RESPIRATORY from 09/25/2017 in [place] [facility]  Date  05/01/17  Educator  GC      Energy Saving Techniques: - Discuss methods to conserve energy and decrease shortness of breath when performing activities of daily living.    PULMONARY REHAB OTHER RESPIRATORY from 09/25/2017 in [place] [facility]  Date  05/08/17  Educator  GC  Instruction Review Code  2- meets goals/outcomes      Bronchial Hygiene / Breathing Techniques: - Discuss breathing mechanics, pursed-lip breathing technique,  proper posture, effective ways to clear airways, and other functional breathing techniques   PULMONARY REHAB OTHER RESPIRATORY from 09/25/2017 in [place] [facility]  Date  05/15/17  Educator  GC      Cleaning Equipment: - Provides group verbal and written instruction about the health risks of elevated stress, cause of high stress, and healthy ways to reduce stress.   PULMONARY REHAB OTHER RESPIRATORY from 09/25/2017 in [place] [facility]  Date  05/22/17  Educator  GC      Nutrition I: Fats: - Discuss the types of cholesterol, what cholesterol does to the body, and how cholesterol levels can be controlled.   PULMONARY REHAB OTHER RESPIRATORY from 09/25/2017 in [place] [facility]  Date  08/28/17  Educator  GC  Instruction Review Code  2- Demonstrated Understanding      Nutrition II: Labels: -Discuss the different components of food labels and how to read food labels.   PULMONARY REHAB OTHER RESPIRATORY from 09/25/2017 in [place] [facility]  Date  09/04/17  Educator  GC  Instruction Review Code  2- Demonstrated Understanding      Respiratory Infections: - Discuss the signs and symptoms of respiratory infections, ways to  prevent respiratory infections, and the importance of seeking medical treatment when having a respiratory infection.   PULMONARY REHAB OTHER RESPIRATORY from 09/25/2017 in [place] [facility]  Date  06/12/17  Educator  GC      Stress I: Signs and Symptoms: - Discuss the causes of stress, how stress may lead to anxiety and depression, and ways to limit stress.   PULMONARY REHAB OTHER RESPIRATORY from 09/25/2017 in [place] [facility]  Date  06/19/17  Educator  GC      Stress II: Relaxation: -Discuss relaxation techniques to limit stress.   PULMONARY REHAB OTHER RESPIRATORY from 09/25/2017 in [place] [facility]  Date  06/26/17  Educator  DC      Oxygen for Home/Travel: - Discuss how to prepare for travel when on oxygen and proper ways to transport and store oxygen to ensure safety.   PULMONARY REHAB OTHER RESPIRATORY from 09/25/2017 in [place] [facility]  Date  07/03/17  Educator  GC      Knowledge Questionnaire Score:     Knowledge Questionnaire Score - 10/02/17 1608        Knowledge Questionnaire Score   Pre Score 11/14   Post Score 6/14      Core Components/Risk Factors/Patient Goals at Admission:     Personal Goals and Risk Factors at Admission - 04/17/17 1257      Core Components/Risk Factors/Patient Goals on Admission    Weight Management Weight Maintenance   Improve shortness of breath with ADL's Yes   Intervention Provide education, individualized exercise plan and daily activity instruction to help decrease symptoms of SOB with activities of daily living.   Expected Outcomes Short Term: Achieves a reduction of symptoms when performing activities of daily living.   Personal Goal Other Yes   Personal Goal Breathe better, Walk longer without SOB   Intervention Attend program 2 x week and supplement at home 3 x week.    Expected Outcomes Reach personal goals      Core Components/Risk Factors/Patient  Goals Review:      Goals and Risk Factor Review    Row Name 05/26/17 1329 06/19/17 0742 07/17/17 1337 08/14/17 1543 09/11/17 1609     Core Components/Risk Factors/Patient Goals Review   Personal Goals Review Weight Management/Obesity;Improve shortness of breath with ADL's  Breathe better; walk longer.  Weight Management/Obesity;Improve shortness of breath with ADL's  Breathe better; walk longer. Weight Management/Obesity;Improve shortness of breath with ADL's;Develop more efficient breathing techniques such as purse lipped breathing and diaphragmatic breathing and practicing self-pacing with activity.  Breathe better; walk longer.  Weight Management/Obesity;Improve shortness of breath with ADL's;Develop more efficient breathing techniques such as purse lipped breathing and diaphragmatic breathing and practicing self-pacing with activity.;Increase knowledge of respiratory medications and ability to use respiratory devices properly. Weight Management/Obesity;Develop more efficient breathing techniques such as purse lipped breathing and diaphragmatic breathing and practicing self-pacing with activity.;Improve shortness of breath with ADL's;Increase knowledge of respiratory medications and ability to use respiratory devices properly.  Breathe better and walk longer.    Review Patient has completed 11 sessions losing 5.3 lbs. She is progressing in the program. Her O2 usage remains the same. She reports no improvement in her SOB but she does feel stronger and better overall. Will continue to monitor for progress.  Patient has completed 16 sessions losing 3.3 lbs. She is progressing and doing well in the program saying her SOB has improved some. Will continue to monitor.  Patient has completed 21 sessions losing 7 lbs. She has done well in the program but has not been able to attend recently d/t a sprained ankle. Will continue to monitor.  Patient has completed 27 sessions 10.8 lbs. She continue to do well in  the program. She says she feels much stronger and is breathing better. She is now able to walk up a flight of stairs. Her SOB has improved. She demonstrates proper pursed lip breathing techniques during sessions. Will continue to monitor. Patient has completed 32 sessions and continues to do well in the program. She says she is breathing better and her SOB has improved. She is able to walk longer and is able to climb stairs now. She feels the program continues to help her meet her goals.    Expected Outcomes Patient will complete the program and continue to work toward meeting her personal goals.  Patient will complete the program and meet her personal goals.  Patient will be able to return and complete the program meeting her personal goals.  Patient will complete the program and continue to meet her personal goals.  Patient will complete the program and continue to   meet her personal goals.    Row Name 10/02/17 1615             Core Components/Risk Factors/Patient Goals Review   Personal Goals Review Weight Management/Obesity;Improve shortness of breath with ADL's;Develop more efficient breathing techniques such as purse lipped breathing and diaphragmatic breathing and practicing self-pacing with activity.  Breathe better; walk longer.        Review Patient graduated with 36 sessions losing 16.3 lbs. She did well in the program. She says she is breathing better and experiencing less SOB with activity. She is able to walk for longer periods and is able to climb stairs. She says she is able to make her bed and wash her clothes now. She feels better and stronger overall. Her exit walk test improved by 26.6%. Her exit measurements improved in grip strength and remained the same in balance. She plans to join our [employer] Maintenance program to continue exercising.        Expected Outcomes Patient will continue to exercise in our [employer] [employer] Maintenance program and continue to improve her SOB, breathing,  and strength. CR will f/u for 1 year.           Core Components/Risk Factors/Patient Goals at Discharge (Final Review):      Goals and Risk Factor Review - 10/02/17 1615      Core Components/Risk Factors/Patient Goals Review   Personal Goals Review Weight Management/Obesity;Improve shortness of breath with ADL's;Develop more efficient breathing techniques such as purse lipped breathing and diaphragmatic breathing and practicing self-pacing with activity.  Breathe better; walk longer.    Review Patient graduated with 36 sessions losing 16.3 lbs. She did well in the program. She says she is breathing better and experiencing less SOB with activity. She is able to walk for longer periods and is able to climb stairs. She says she is able to make her bed and wash her clothes now. She feels better and stronger overall. Her exit walk test improved by 26.6%. Her exit measurements improved in grip strength and remained the same in balance. She plans to join our [employer] Maintenance program to continue exercising.    Expected Outcomes Patient will continue to exercise in our [employer] Maintenance program and continue to improve her SOB, breathing, and strength. CR will f/u for 1 year.       ITP Comments:     ITP Comments    Row Name 04/30/17 1322 05/15/17 1247         ITP Comments Patient new to program completing 4 sessions. Will continue to monitor for progress.  Patient met with Registered Dietitian to discuss nutrition topics including: Heart healthty eating, heart healthy cooking and making smart choices when shopping; Portion control; weight management; and hydration. Patient attended a group session with the hospital chaplian called [facility] to discuss and share how this recent diagnosis has effected their life         Comments: Patient graduated from Pulmonary Rehabilitation today on 09/30/2017 after completing 36 sessions. She achieved LTG of 30 minutes of aerobic exercise at  Max Met level of 1.9. All patients vitals are WNL. Patient has met with dietician. Discharge instruction has been reviewed in detail and patient stated an understanding of material given. Patient plans to join our [employer] Maintenance program to continue exercising. Pulmonary Rehab staff will make f/u calls at 1 month, 6 months, and 1 year. Patient had no complaints of any abnormal S/S or pain on their   exit visit.

## 2017-10-03 DIAGNOSIS — F329 Major depressive disorder, single episode, unspecified: Secondary | ICD-10-CM | POA: Diagnosis not present

## 2017-10-03 DIAGNOSIS — J9611 Chronic respiratory failure with hypoxia: Secondary | ICD-10-CM | POA: Diagnosis not present

## 2017-10-03 DIAGNOSIS — H5015 Alternating exotropia: Secondary | ICD-10-CM | POA: Diagnosis not present

## 2017-10-03 DIAGNOSIS — H04123 Dry eye syndrome of bilateral lacrimal glands: Secondary | ICD-10-CM | POA: Diagnosis not present

## 2017-10-03 DIAGNOSIS — R0602 Shortness of breath: Secondary | ICD-10-CM | POA: Diagnosis not present

## 2017-10-03 DIAGNOSIS — E785 Hyperlipidemia, unspecified: Secondary | ICD-10-CM | POA: Diagnosis not present

## 2017-10-03 DIAGNOSIS — J189 Pneumonia, unspecified organism: Secondary | ICD-10-CM | POA: Diagnosis not present

## 2017-10-03 DIAGNOSIS — K219 Gastro-esophageal reflux disease without esophagitis: Secondary | ICD-10-CM | POA: Diagnosis not present

## 2017-10-03 DIAGNOSIS — J441 Chronic obstructive pulmonary disease with (acute) exacerbation: Secondary | ICD-10-CM | POA: Diagnosis not present

## 2017-10-03 DIAGNOSIS — J019 Acute sinusitis, unspecified: Secondary | ICD-10-CM | POA: Diagnosis not present

## 2017-10-03 DIAGNOSIS — I272 Pulmonary hypertension, unspecified: Secondary | ICD-10-CM | POA: Diagnosis not present

## 2017-10-03 DIAGNOSIS — Z87891 Personal history of nicotine dependence: Secondary | ICD-10-CM | POA: Diagnosis not present

## 2017-10-03 DIAGNOSIS — I50813 Acute on chronic right heart failure: Secondary | ICD-10-CM | POA: Diagnosis not present

## 2017-10-03 DIAGNOSIS — E559 Vitamin D deficiency, unspecified: Secondary | ICD-10-CM | POA: Diagnosis not present

## 2017-10-03 DIAGNOSIS — Z23 Encounter for immunization: Secondary | ICD-10-CM | POA: Diagnosis not present

## 2017-10-03 DIAGNOSIS — J849 Interstitial pulmonary disease, unspecified: Secondary | ICD-10-CM | POA: Diagnosis not present

## 2017-10-15 ENCOUNTER — Encounter: Payer: Self-pay | Admitting: *Deleted

## 2017-10-24 ENCOUNTER — Telehealth: Payer: Self-pay | Admitting: Physician Assistant

## 2017-10-24 NOTE — Telephone Encounter (Signed)
What is the name of the medication? Cough medicine  Have you contacted your pharmacy to request a refill? no  Which pharmacy would you like this sent to? [facility] [address] in [place]   Patient notified that their request is being sent to the clinical staff for review and that they should receive a call once it is complete. If they do not receive a call within 24 hours they can check with their pharmacy or our office.

## 2017-10-24 NOTE — Telephone Encounter (Signed)
LM--need more info?

## 2017-10-28 NOTE — Telephone Encounter (Signed)
Since it has been 4 days, I guess we can close?

## 2017-11-18 ENCOUNTER — Other Ambulatory Visit: Payer: Self-pay | Admitting: Physician Assistant

## 2017-11-19 NOTE — Telephone Encounter (Signed)
rx called into pharmacy

## 2017-11-21 ENCOUNTER — Ambulatory Visit: Payer: Medicare PPO | Admitting: Physician Assistant

## 2017-11-21 ENCOUNTER — Encounter: Payer: Self-pay | Admitting: Physician Assistant

## 2017-11-21 DIAGNOSIS — J40 Bronchitis, not specified as acute or chronic: Secondary | ICD-10-CM

## 2017-11-21 DIAGNOSIS — M5136 Other intervertebral disc degeneration, lumbar region: Secondary | ICD-10-CM

## 2017-11-21 DIAGNOSIS — J432 Centrilobular emphysema: Secondary | ICD-10-CM | POA: Diagnosis not present

## 2017-11-21 MED ORDER — HYDROCODONE-HOMATROPINE 5-1.5 MG/5ML PO SYRP: 5.0000 mL | ORAL_SOLUTION | Freq: Four times a day (QID) | ORAL | 0 refills | Status: DC | PRN

## 2017-11-21 MED ORDER — HYDROCODONE-ACETAMINOPHEN 10-325 MG PO TABS: 1.0000 | ORAL_TABLET | Freq: Four times a day (QID) | ORAL | 0 refills | Status: DC | PRN

## 2017-11-21 MED ORDER — TORSEMIDE 20 MG PO TABS: 40.0000 mg | ORAL_TABLET | Freq: Two times a day (BID) | ORAL | 3 refills | Status: DC

## 2017-11-21 MED ORDER — CITALOPRAM HYDROBROMIDE 40 MG PO TABS: ORAL_TABLET | ORAL | 3 refills | Status: DC

## 2017-11-21 MED ORDER — VITAMIN D (ERGOCALCIFEROL) 1.25 MG (50000 UNIT) PO CAPS: ORAL_CAPSULE | ORAL | 3 refills | Status: DC

## 2017-11-21 NOTE — Patient Instructions (Signed)
In a few days you may receive a survey in the mail or online from [employer] regarding your visit with us today. Please take a moment to fill this out. Your feedback is very important to our whole office. It can help us better understand your needs as well as improve your experience and satisfaction. Thank you for taking your time to complete it. We care about you.  Baneza Bartoszek, PA-C  

## 2017-11-21 NOTE — Progress Notes (Signed)
BP 98/62   Pulse 80   Temp (!) 96.8 F (36 C) (Oral)   Ht 5\' 7"  (1.702 m)   Wt 241 lb (109.3 kg)   BMI 37.75 kg/m    Subjective:    Patient ID: , female    DOB: [birth year], 61 y.o.   MRN: [id number]  HPI: Kelly Donaldson is a 61 y.o. female presenting on 11/21/2017 for Follow-up (3 month )  This patient comes in for periodic recheck on medications and conditions including DDD, chronic pain, COPD.  She still followed by cardiology and pulmonology.  She does have pulmonary hypertension.  Medications for this is covered through her specialist.  All of her medications are reviewed there is some refills that will need to be sent to [facility] mail in pharmacy.  She also needs refills printed..   All medications are reviewed today. There are no reports of any problems with the medications. All of the medical conditions are reviewed and updated.  Lab work is reviewed and will be ordered as medically necessary. There are no new problems reported with today's visit.   Relevant past medical, surgical, family and social history reviewed and updated as indicated. Allergies and medications reviewed and updated.  Past Medical History:  Diagnosis Date  . Allergy   . Depression   . GERD (gastroesophageal reflux disease)   . Hyperlipidemia   . Oxygen dependent   . Shortness of breath dyspnea   . Varicose veins     Past Surgical History:  Procedure Laterality Date  . CARDIAC CATHETERIZATION N/A 04/20/2015   Procedure: Right Heart Cath;  Surgeon: 06/20/2015, MD;  Location: [facility] INVASIVE CV LAB;  Service: Cardiovascular;  Laterality: N/A;  . RIGHT HEART CATH N/A 06/13/2017   Procedure: Right Heart Cath;  Surgeon: 08/14/2017, MD;  Location: [facility] INVASIVE CV LAB;  Service: Cardiovascular;  Laterality: N/A;  . TEE WITHOUT CARDIOVERSION N/A 05/11/2015   Procedure: TRANSESOPHAGEAL ECHOCARDIOGRAM (TEE);  Surgeon: 07/11/2015, MD;  Location: [facility] ENDOSCOPY;  Service: Cardiovascular;   Laterality: N/A;  . TUBAL LIGATION     11/84    Review of Systems  Constitutional: Negative.  Negative for activity change, fatigue and fever.  HENT: Negative.   Eyes: Negative.   Respiratory: Negative.  Negative for cough.   Cardiovascular: Negative.  Negative for chest pain.  Gastrointestinal: Negative.  Negative for abdominal pain.  Endocrine: Negative.   Genitourinary: Negative.  Negative for dysuria.  Musculoskeletal: Positive for arthralgias and back pain.  Skin: Negative.   Neurological: Negative.     Allergies as of 11/21/2017      Reactions   Gabapentin    Pt states that she can not take with antidepressants   Latex Rash      Medication List        Accurate as of 11/21/17  1:59 PM. Always use your most recent med list.          aspirin EC 81 MG tablet Take 1 tablet (81 mg total) by mouth daily.   cetirizine 10 MG tablet Commonly known as:  ZYRTEC Take 1 tablet (10 mg total) by mouth daily.   citalopram 40 MG tablet Commonly known as:  CELEXA TAKE ONE TABLET BY MOUTH ONCE DAILY FOR 30 DAYS   HYDROcodone-acetaminophen 10-325 MG tablet Commonly known as:  NORCO Take 1-2 tablets by mouth every 6 (six) hours as needed.   HYDROcodone-acetaminophen 10-325 MG tablet Commonly known as:  NORCO Take  1-2 tablets by mouth every 6 (six) hours as needed.   HYDROcodone-acetaminophen 10-325 MG tablet Commonly known as:  NORCO Take 1-2 tablets by mouth every 6 (six) hours as needed.   HYDROcodone-homatropine 5-1.5 MG/5ML syrup Commonly known as:  HYCODAN Take 5-10 mLs by mouth every 6 (six) hours as needed.   HYDROcodone-homatropine 5-1.5 MG/5ML syrup Commonly known as:  HYCODAN Take 5-10 mLs by mouth every 6 (six) hours as needed.   HYDROcodone-homatropine 5-1.5 MG/5ML syrup Commonly known as:  HYCODAN Take 5 mLs by mouth every 6 (six) hours as needed for cough.   ipratropium-albuterol 0.5-2.5 (3) MG/3ML Soln Commonly known as:  DUONEB Take 3 mLs by  nebulization 4 (four) times daily as needed.   Ipratropium-Albuterol 20-100 MCG/ACT Aers respimat Commonly known as:  COMBIVENT RESPIMAT Inhale 1 puff into the lungs every 6 (six) hours as needed for wheezing.   Macitentan 10 MG Tabs Commonly known as:  OPSUMIT Take 1 tablet (10 mg total) by mouth daily.   meloxicam 7.5 MG tablet Commonly known as:  MOBIC Take 7.5 mg by mouth at bedtime.   OXYGEN 2.5 lpm with sleep and 4 lpm with exertion  [facility]   potassium chloride SA 20 MEQ tablet Commonly known as:  K-DUR,KLOR-CON Take 1 tablet (20 mEq total) by mouth 2 (two) times daily.   pravastatin 20 MG tablet Commonly known as:  PRAVACHOL Take 1 tablet (20 mg total) by mouth daily.   Riociguat 2 MG Tabs Commonly known as:  ADEMPAS Take 2 mg by mouth 3 (three) times daily.   Selexipag 200 MCG Tabs Take 1 tablet (200 mcg total) by mouth 2 (two) times daily.   torsemide 20 MG tablet Commonly known as:  DEMADEX Take 2 tablets (40 mg total) by mouth 2 (two) times daily.   Vitamin D (Ergocalciferol) 50000 units Caps capsule Commonly known as:  DRISDOL TAKE ONE CAPSULE BY MOUTH ONCE WEEKLY FOR 30 DAYS   zolpidem 5 MG tablet Commonly known as:  AMBIEN TAKE ONE TABLET AT BEDTIME          Objective:    BP 98/62   Pulse 80   Temp (!) 96.8 F (36 C) (Oral)   Ht 5\' 7"  (1.702 m)   Wt 241 lb (109.3 kg)   BMI 37.75 kg/m   Allergies  Allergen Reactions  . Gabapentin     Pt states that she can not take with antidepressants  . Latex Rash    Physical Exam  Constitutional: She is oriented to person, place, and time. She appears well-developed and well-nourished.  HENT:  Head: Normocephalic and atraumatic.  Right Ear: Tympanic membrane, external ear and ear canal normal.  Left Ear: Tympanic membrane, external ear and ear canal normal.  Nose: Nose normal. No rhinorrhea.  Mouth/Throat: Oropharynx is clear and moist and mucous membranes are normal. No oropharyngeal  exudate or posterior oropharyngeal erythema.  Eyes: Conjunctivae and EOM are normal. Pupils are equal, round, and reactive to light.  Neck: Normal range of motion. Neck supple.  Cardiovascular: Normal rate, regular rhythm, normal heart sounds and intact distal pulses.  Pulmonary/Chest: Effort normal and breath sounds normal.  Abdominal: Soft. Bowel sounds are normal.  Neurological: She is alert and oriented to person, place, and time. She has normal reflexes.  Skin: Skin is warm and dry. No rash noted.  Psychiatric: She has a normal mood and affect. Her behavior is normal. Judgment and thought content normal.  Nursing note and vitals reviewed.  Results for orders placed or performed during the hospital encounter of 09/02/17  Basic metabolic panel  Result Value Ref Range   Sodium 137 135 - 145 mmol/L   Potassium 4.6 3.5 - 5.1 mmol/L   Chloride 104 101 - 111 mmol/L   CO2 24 22 - 32 mmol/L   Glucose, Bld 94 65 - 99 mg/dL   BUN 23 (H) 6 - 20 mg/dL   Creatinine, Ser 9.47 (H) 0.44 - 1.00 mg/dL   Calcium 8.8 (L) 8.9 - 10.3 mg/dL   GFR calc non Af Amer 45 (L) >60 mL/min   GFR calc Af Amer 53 (L) >60 mL/min   Anion gap 9 5 - 15  B Nat Peptide  Result Value Ref Range   B Natriuretic Peptide 68.6 0.0 - 100.0 pg/mL      Assessment & Plan:   1. DDD (degenerative disc disease), lumbar - HYDROcodone-acetaminophen (NORCO) 10-325 MG tablet; Take 1-2 tablets by mouth every 6 (six) hours as needed.  Dispense: 240 tablet; Refill: 0 - HYDROcodone-acetaminophen (NORCO) 10-325 MG tablet; Take 1-2 tablets by mouth every 6 (six) hours as needed.  Dispense: 240 tablet; Refill: 0 - HYDROcodone-acetaminophen (NORCO) 10-325 MG tablet; Take 1-2 tablets by mouth every 6 (six) hours as needed.  Dispense: 240 tablet; Refill: 0  2. Bronchitis - HYDROcodone-homatropine (HYCODAN) 5-1.5 MG/5ML syrup; Take 5-10 mLs by mouth every 6 (six) hours as needed.  Dispense: 240 mL; Refill: 0 - HYDROcodone-homatropine  (HYCODAN) 5-1.5 MG/5ML syrup; Take 5-10 mLs by mouth every 6 (six) hours as needed.  Dispense: 240 mL; Refill: 0 - HYDROcodone-homatropine (HYCODAN) 5-1.5 MG/5ML syrup; Take 5 mLs by mouth every 6 (six) hours as needed for cough.  Dispense: 120 mL; Refill: 0  3. Centrilobular emphysema (HCC)    Current Outpatient Medications:  .  aspirin EC 81 MG tablet, Take 1 tablet (81 mg total) by mouth daily., Disp: 90 tablet, Rfl: 3 .  cetirizine (ZYRTEC) 10 MG tablet, Take 1 tablet (10 mg total) by mouth daily. (Patient taking differently: Take 10 mg by mouth at bedtime. ), Disp: 90 tablet, Rfl: 3 .  citalopram (CELEXA) 40 MG tablet, TAKE ONE TABLET BY MOUTH ONCE DAILY FOR 30 DAYS, Disp: 90 tablet, Rfl: 3 .  HYDROcodone-acetaminophen (NORCO) 10-325 MG tablet, Take 1-2 tablets by mouth every 6 (six) hours as needed., Disp: 240 tablet, Rfl: 0 .  HYDROcodone-acetaminophen (NORCO) 10-325 MG tablet, Take 1-2 tablets by mouth every 6 (six) hours as needed., Disp: 240 tablet, Rfl: 0 .  HYDROcodone-acetaminophen (NORCO) 10-325 MG tablet, Take 1-2 tablets by mouth every 6 (six) hours as needed., Disp: 240 tablet, Rfl: 0 .  HYDROcodone-homatropine (HYCODAN) 5-1.5 MG/5ML syrup, Take 5-10 mLs by mouth every 6 (six) hours as needed., Disp: 240 mL, Rfl: 0 .  HYDROcodone-homatropine (HYCODAN) 5-1.5 MG/5ML syrup, Take 5-10 mLs by mouth every 6 (six) hours as needed., Disp: 240 mL, Rfl: 0 .  HYDROcodone-homatropine (HYCODAN) 5-1.5 MG/5ML syrup, Take 5 mLs by mouth every 6 (six) hours as needed for cough., Disp: 120 mL, Rfl: 0 .  Ipratropium-Albuterol (COMBIVENT RESPIMAT) 20-100 MCG/ACT AERS respimat, Inhale 1 puff into the lungs every 6 (six) hours as needed for wheezing., Disp: 3 Inhaler, Rfl: 3 .  ipratropium-albuterol (DUONEB) 0.5-2.5 (3) MG/3ML SOLN, Take 3 mLs by nebulization 4 (four) times daily as needed. (Patient taking differently: Take 3 mLs by nebulization 4 (four) times daily as needed (for wheezing/shortness of  breath). ), Disp: 360 mL, Rfl: 11 .  Macitentan (  OPSUMIT) 10 MG TABS, Take 1 tablet (10 mg total) by mouth daily., Disp: 30 tablet, Rfl: 11 .  meloxicam (MOBIC) 7.5 MG tablet, Take 7.5 mg by mouth at bedtime., Disp: , Rfl:  .  OXYGEN, 2.5 lpm with sleep and 4 lpm with exertion  [facility], Disp: , Rfl:  .  potassium chloride SA (K-DUR,KLOR-CON) 20 MEQ tablet, Take 1 tablet (20 mEq total) by mouth 2 (two) times daily., Disp: 180 tablet, Rfl: 3 .  pravastatin (PRAVACHOL) 20 MG tablet, Take 1 tablet (20 mg total) by mouth daily. (Patient taking differently: Take 20 mg by mouth at bedtime. ), Disp: 90 tablet, Rfl: 3 .  Riociguat (ADEMPAS) 2 MG TABS, Take 2 mg by mouth 3 (three) times daily., Disp: 90 tablet, Rfl: 11 .  Selexipag 200 MCG TABS, Take 1 tablet (200 mcg total) by mouth 2 (two) times daily., Disp: 60 tablet, Rfl: 6 .  torsemide (DEMADEX) 20 MG tablet, Take 2 tablets (40 mg total) by mouth 2 (two) times daily., Disp: 360 tablet, Rfl: 3 .  Vitamin D, Ergocalciferol, (DRISDOL) 50000 units CAPS capsule, TAKE ONE CAPSULE BY MOUTH ONCE WEEKLY FOR 30 DAYS, Disp: 12 capsule, Rfl: 3 .  zolpidem (AMBIEN) 5 MG tablet, TAKE ONE TABLET AT BEDTIME, Disp: 30 tablet, Rfl: 2 Continue all other maintenance medications as listed above.  Follow up plan: Return in about 3 months (around 02/19/2018) for recheck.  Educational handout given for survey  Remus Loffler PA-C [facility] [address]  [place], [place] [address] [phone]   11/21/2017, 1:59 PM

## 2017-11-27 ENCOUNTER — Ambulatory Visit: Payer: Medicare PPO | Admitting: Physician Assistant

## 2017-11-28 ENCOUNTER — Ambulatory Visit (HOSPITAL_COMMUNITY)
Admission: RE | Admit: 2017-11-28 | Discharge: 2017-11-28 | Disposition: A | Discharge status: Home / Self Care | Payer: Medicare PPO | Source: Ambulatory Visit | Attending: Cardiology | Admitting: Cardiology

## 2017-11-28 ENCOUNTER — Telehealth (HOSPITAL_COMMUNITY): Payer: Self-pay | Admitting: Pharmacist

## 2017-11-28 ENCOUNTER — Encounter (HOSPITAL_COMMUNITY): Payer: Self-pay | Admitting: Cardiology

## 2017-11-28 ENCOUNTER — Ambulatory Visit: Payer: Medicare PPO | Admitting: Physician Assistant

## 2017-11-28 VITALS — BP 102/48 | HR 80 | Wt 241.0 lb

## 2017-11-28 DIAGNOSIS — I110 Hypertensive heart disease with heart failure: Secondary | ICD-10-CM | POA: Insufficient documentation

## 2017-11-28 DIAGNOSIS — E785 Hyperlipidemia, unspecified: Secondary | ICD-10-CM | POA: Insufficient documentation

## 2017-11-28 DIAGNOSIS — Z79891 Long term (current) use of opiate analgesic: Secondary | ICD-10-CM | POA: Diagnosis not present

## 2017-11-28 DIAGNOSIS — I872 Venous insufficiency (chronic) (peripheral): Secondary | ICD-10-CM | POA: Insufficient documentation

## 2017-11-28 DIAGNOSIS — J9611 Chronic respiratory failure with hypoxia: Secondary | ICD-10-CM

## 2017-11-28 DIAGNOSIS — Z79899 Other long term (current) drug therapy: Secondary | ICD-10-CM | POA: Diagnosis not present

## 2017-11-28 DIAGNOSIS — Z7982 Long term (current) use of aspirin: Secondary | ICD-10-CM | POA: Insufficient documentation

## 2017-11-28 DIAGNOSIS — Z87891 Personal history of nicotine dependence: Secondary | ICD-10-CM | POA: Insufficient documentation

## 2017-11-28 DIAGNOSIS — J449 Chronic obstructive pulmonary disease, unspecified: Secondary | ICD-10-CM | POA: Diagnosis not present

## 2017-11-28 DIAGNOSIS — R590 Localized enlarged lymph nodes: Secondary | ICD-10-CM | POA: Insufficient documentation

## 2017-11-28 DIAGNOSIS — R42 Dizziness and giddiness: Secondary | ICD-10-CM | POA: Insufficient documentation

## 2017-11-28 DIAGNOSIS — Z791 Long term (current) use of non-steroidal anti-inflammatories (NSAID): Secondary | ICD-10-CM | POA: Diagnosis not present

## 2017-11-28 DIAGNOSIS — I2721 Secondary pulmonary arterial hypertension: Secondary | ICD-10-CM

## 2017-11-28 DIAGNOSIS — J84112 Idiopathic pulmonary fibrosis: Secondary | ICD-10-CM | POA: Insufficient documentation

## 2017-11-28 DIAGNOSIS — I5032 Chronic diastolic (congestive) heart failure: Secondary | ICD-10-CM | POA: Insufficient documentation

## 2017-11-28 LAB — BASIC METABOLIC PANEL
Anion gap: 8 (ref 5–15)
BUN: 23 mg/dL — AB (ref 6–20)
CHLORIDE: 104 mmol/L (ref 101–111)
CO2: 24 mmol/L (ref 22–32)
CREATININE: 1.43 mg/dL — AB (ref 0.44–1.00)
Calcium: 8.9 mg/dL (ref 8.9–10.3)
GFR calc non Af Amer: 39 mL/min — ABNORMAL LOW (ref >60)
GFR, EST AFRICAN AMERICAN: 45 mL/min — AB (ref >60)
Glucose, Bld: 99 mg/dL (ref 65–99)
Potassium: 4.6 mmol/L (ref 3.5–5.1)
Sodium: 136 mmol/L (ref 135–145)

## 2017-11-28 LAB — LIPID PANEL
Cholesterol: 165 mg/dL (ref 0–200)
HDL: 69 mg/dL (ref >40)
LDL CALC: 81 mg/dL (ref 0–99)
TRIGLYCERIDES: 73 mg/dL (ref <150)
Total CHOL/HDL Ratio: 2.4 RATIO
VLDL: 15 mg/dL (ref 0–40)

## 2017-11-28 NOTE — Telephone Encounter (Signed)
She agreed to take [name] if we can get it affordably for her.

## 2017-11-28 NOTE — Telephone Encounter (Signed)
Received the following email from [facility] regarding Ms. Ellett's Uptravi:   "Hello, I just wanted to give you a status update on this patient. She still has not completed the application for copay assistance . Her copay is currently $510.24. Patient did call in for a refill but will not agree to the copay and will not complete the copay assistance application. Patients last shipment was 10/25."  Attempted to speak with Ms. Tench about this and to see if we could assist in any way and had to leave VM for her to call back.   Tyler Deis. Bonnye Fava, PharmD, BCPS, CPP Clinical Pharmacist Pager: [phone] Phone: [phone] 11/28/2017 11:36 AM

## 2017-11-28 NOTE — Patient Instructions (Addendum)
Increase Torsemide 60 mg (3 tabs) in AM, and 40 mg (2 tabs) in PM for 2 days  -Then take Torsemide 40 mg (2 tabs), twice a day  Labs drawn today (if we do not call you, then your lab work was stable)   Your physician recommends that you return for lab work in: 10 days Rx given to have done in [place]   Your physician recommends that you schedule a follow-up appointment in: 2 months with [name]

## 2017-11-30 NOTE — Progress Notes (Signed)
Patient ID: Kelly Donaldson, female   DOB: 1956/01/18, 61 y.o.   MRN: [id number]   PCP: Prudy Feeler Pulmonology: Dr [name] Primary HF: Dr Shirlee Latch   [name] is a 61 year old with history of chronic hypoxemic respiratory failure and pulmonary hypertension.  She has been followed by Dr [name], now follows with Dr. [name] with pulmonology.  Has been on home oxygen for 6 years.  She quit smoking in 2016.  She had an echo done in 5/16 showing dilated RV with severe pulmonary hypertension.  She therefore had RHC in 5/16 which confirmed severe pulmonary hypertension and RV failure.  PFTs showed only mild obstruction and restriction with markedly decreased DLCO suggestive of pulmonary vascular disease.  There was concern for sarcoidosis on CT chest but lymph node biopsy showed no evidence for sarcoidosis.  V/Q scan showed no evidence for chronic PE.   She has not tolerated Revatio or Adcirca.  Have failed previous up-titration of Selexipag with pelvic pain, can tolerate 200 mcg bid.  She is now tolerating riociguat 2 mg tid.   Started torsemide in Oct. 2017, has had better diuresis.   Pt presents today for followup of pulmonary hypertension.  She ran out of selexipag and did not refill it (does not feel like it helped as much as riociguat and Opsumit). Weight is down 5 lbs.  She is doing maintenance pulmonary rehab.  Does not want to due 6 minute walk today due to foot pain.  She is short of breath if she tries to walk too fast but is generally able to get around [employer].  Very short of breath with stairs.  She is taking torsemide 40 mg qam/20 mg qpm.   6 minute walk (5/16): 238 meters => decreased oxygen saturation to 73%, increased to 6 L [place] 6 minute walk (7/16): 170 meters => unable to complete.  6 minute walk (9/16): 201 meters 6 minute walk (1/17): 256 meters 6 minute walk (4/17): 219 meters 6 minute walk (8/17): 225 meters 6 minute walk (11/17): 244 meters 6 minute walk (5/18): 229 meters  Labs (2/16):  BNP 69 Labs (5/16): LFTs normal, HCT 34.9, RF negative, HIV negative, TSH negative Labs (6/16): K 4.4, creatinine 0.88 Labs (7/16): ANA negative, BNP 58, K 4.3, creatinine 1.1, anti-RNP negative Labs (9/16): K 4.4, creatinine 1.26, BNP 91 Labs (10/16): K 4.3, creatinine 1.04 Labs (12/16): K 4.2, creatinine 1.07, BNP 130 Labs (1/17): K 4.3, creatinine 1.23, BNP 42 Labs (3/17): K 4.2, creatinine 1.19 Labs (5/17): K 4.3, creatinine 1.35, BNP 44 Labs (6/17): K 4.4, creatinine 1.18, BNP 44 Labs (10/17): K 4.2, creatinine 0.98, BNP 43 Labs (12/17): K 4.5, creatinine 1.23. BNP 64.5 Labs (2/18): K 4.5, creatinine 1.21, BNP 64 Labs (4/18): K 4.1, creatinine 1.24, BNP 26 Labs (9/18): K 4.6, creatinine 1.25 . PMH: 1. Pulmonary hypertension: RHC (5/16) with mean RA 18, PA 83/33 mean 53, mean PCWP 19, CI 2.61, PVR 5.8 WU.   Echo (5/16) with EF 65-70%, septal flattening, dilated RV with PA systolic pressure 99 mmHg, +bubble study.  PFTs (4/16) with FVC 76%, FEV1 75%, ratio 97%, TLC 66%, DLCO 26% => mild restriction, mild obstruction, severe diffuse abnormality (pulmonary vascular problem).  CTA chest (4/16) with chronic interstitial and obstructive lung disease, small mediastinal and hilar lymph noes, no PE.  Lymph node biopsy negative for sarcoidosis (reactive changes).  V/Q scan (6/16): No acute or chronic PE. TEE (6/16): Normal LV size and systolic function, EF 55-60%,RV was mildly  dilated with mildly decreased systolic function, D-shaped interventricular septum suggestive of RV pressure/volume overload, there appeared to be a small PFO present with some bubbles crossing, no ASD.  ANA negative, anti-RNP negative, HIV negative. Sleep study (8/16) without OSA.  She did not tolerate Adcirca or Revatio.  She cannot tolerate more than 200 mcg bid Selexipag.  - Echo (6/17): EF 60-65%, normal diastolic function, D-shaped interventricular septum, mildly dilated RV with normal RV systolic function, PASP 39, IVC  normal.  - Echo (6/18): EF 60-65%, normal diastolic function, D-shaped interventricular septum, mildly dilated RV with normal RV systolic function, PASP 65 mmHg.  - RHC (7/18): RA 13, PA 51/23 mean 36, PCWP 19, CI 4.74, PVR 1.6 WU 2. Chronic venous insufficiency. 3. Hyperlipidemia.  4. GERD 5. Chronic hypoxemic respiratory failure: Hypoxemia noted x 6 years at least.  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   SH: Married, quit smoking in 3/16.  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    FH: No pulmonary hypertension, no premature CAD, no sudden death.   Review of systems complete and found to be negative unless listed in HPI.    Current Outpatient Medications  Medication Sig Dispense Refill  . aspirin EC 81 MG tablet Take 1 tablet (81 mg total) by mouth daily. 90 tablet 3  . cetirizine (ZYRTEC) 10 MG tablet Take 1 tablet (10 mg total) by mouth daily. (Patient taking differently: Take 10 mg by mouth at bedtime. ) 90 tablet 3  . citalopram (CELEXA) 40 MG tablet TAKE ONE TABLET BY MOUTH ONCE DAILY FOR 30 DAYS 90 tablet 3  . HYDROcodone-acetaminophen (NORCO) 10-325 MG tablet Take 1-2 tablets by mouth every 6 (six) hours as needed. 240 tablet 0  . HYDROcodone-acetaminophen (NORCO) 10-325 MG tablet Take 1-2 tablets by mouth every 6 (six) hours as needed. 240 tablet 0  . HYDROcodone-acetaminophen (NORCO) 10-325 MG tablet Take 1-2 tablets by mouth every 6 (six) hours as needed. 240 tablet 0  . HYDROcodone-homatropine (HYCODAN) 5-1.5 MG/5ML syrup Take 5-10 mLs by mouth every 6 (six) hours as needed. 240 mL 0  . HYDROcodone-homatropine (HYCODAN) 5-1.5 MG/5ML syrup Take 5-10 mLs by mouth every 6 (six) hours as needed. 240 mL 0  . HYDROcodone-homatropine (HYCODAN) 5-1.5 MG/5ML syrup Take 5 mLs by mouth every 6 (six) hours as needed for cough. 120 mL 0  . Ipratropium-Albuterol (COMBIVENT RESPIMAT) 20-100 MCG/ACT AERS respimat Inhale 1 puff into the lungs every 6  (six) hours as needed for wheezing. 3 Inhaler 3  . ipratropium-albuterol (DUONEB) 0.5-2.5 (3) MG/3ML SOLN Take 3 mLs by nebulization 4 (four) times daily as needed. (Patient taking differently: Take 3 mLs by nebulization 4 (four) times daily as needed (for wheezing/shortness of breath). ) 360 mL 11  . Macitentan (OPSUMIT) 10 MG TABS Take 1 tablet (10 mg total) by mouth daily. 30 tablet 11  . meloxicam (MOBIC) 7.5 MG tablet Take 7.5 mg by mouth at bedtime.    . OXYGEN 2.5 lpm with sleep and 4 lpm with exertion  [facility]    . potassium chloride SA (K-DUR,KLOR-CON) 20 MEQ tablet Take 1 tablet (20 mEq total) by mouth 2 (two) times daily. 180 tablet 3  . pravastatin (PRAVACHOL) 20 MG tablet Take 1 tablet (20 mg total) by mouth daily. (Patient taking differently: Take 20 mg by mouth at bedtime. ) 90 tablet 3  . Riociguat (ADEMPAS) 2 MG TABS Take 2 mg by mouth 3 (three) times daily. 90 tablet 11  .  Selexipag 200 MCG TABS Take 1 tablet (200 mcg total) by mouth 2 (two) times daily. 60 tablet 6  . torsemide (DEMADEX) 20 MG tablet Take 2 tablets (40 mg total) by mouth 2 (two) times daily. 360 tablet 3  . Vitamin D, Ergocalciferol, (DRISDOL) 50000 units CAPS capsule TAKE ONE CAPSULE BY MOUTH ONCE WEEKLY FOR 30 DAYS 12 capsule 3  . zolpidem (AMBIEN) 5 MG tablet TAKE ONE TABLET AT BEDTIME 30 tablet 2   No current facility-administered medications for this encounter.    BP (!) 102/48 (BP Location: Left Arm, Patient Position: Sitting, Cuff Size: Large)   Pulse 80   Wt 241 lb (109.3 kg)   SpO2 90% Comment: on 4L  BMI 37.75 kg/m   Wt Readings from Last 3 Encounters:  11/28/17 241 lb (109.3 kg)  11/21/17 241 lb (109.3 kg)  10/01/17 243 lb 8 oz (110.5 kg)    General: NAD, wearing oxygen.  Neck: JVP 8-9 cm, no thyromegaly or thyroid nodule.  Lungs: Clear to auscultation bilaterally with normal respiratory effort. CV: Nondisplaced PMI.  Heart regular S1/S2, no S3/S4, no murmur.  1+ edema 1/2 to knees  bilaterally.  No carotid bruit.  Normal pedal pulses.  Abdomen: Soft, nontender, no hepatosplenomegaly, no distention.  Skin: Intact without lesions or rashes.  Neurologic: Alert and oriented x 3.  Psych: Normal affect. Extremities: No clubbing or cyanosis.  HEENT: Normal.   Assessment/Plan:  1. Pulmonary arterial hypertension with RV failure: Severe on 5/16 RHC. 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  There was concern from CTA chest for sarcoidosis, but lymph node biopsy was negative.  LFTs normal, ANA negative, anti-RNP negative, HIV negative.  V/Q scan negative for chronic PE.  Small PFO but no ASD on TEE.  No OSA on sleep study.  She did not tolerate Revatio due to joint pain (now resolved), and she did not tolerate Adcirca with worsening dyspnea.  She does not tolerate more than 200 mcg bid Selexipag due to pelvic pain.  Echo 6/17 showed mildly dilated RV with normal systolic function and PASP appeared lower at 39 mmHg. Echo in 6/18 showed mildly dilated RV with normal systolic function, but PASP appeared higher than prior at 65 mmHg.  However, repeat RHC in 7/18 showed primarily pulmonary venous hypertension with PVR only 1.6 WU.  - Open lung biopsy deemed too risky, probably not sarcoid.    - Continue opsumit and riociguat 2 mg twice a day (unable to go higher due to dizziness).   - She tolerated selexipag 200 mcg bid. Intolerant to up-titration of selexipag due to joint pain. She has stopped selexipag (ran out and did not refill).  I encouraged her to restart 200 mcg bid.  - Defer 6 minute walk today because of foot pain => will need to do next appt.  2. Chronic hypoxemic respiratory failure: Chronic interstitial and obstructive lung disease, mediastinal and hilar lymphadenopathy.  Concern for sarcoidosis.  PFTs showed only mild restriction and obstruction.  Biopsy did not show sarcoid.  Deemed too high  risk for open lung biopsy.  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 - O2 sats stable. Continue current regimen.  - She follows with Dr. [name] in pulmonary, needs repeat appt.  3. Chronic diastolic CHF: She is volume overloaded on exam.   - Increase torsemide to 60 qam/40 qpm x 2 days then 40 mg bid.  BMET today  and another BMET in 2 wks.  4. Hyperlipidemia: Check lipids today.  [name], MD  11/30/2017

## 2017-12-05 ENCOUNTER — Encounter (HOSPITAL_COMMUNITY): Payer: Self-pay | Admitting: Cardiology

## 2017-12-05 ENCOUNTER — Encounter (HOSPITAL_COMMUNITY): Payer: Medicare PPO | Admitting: Cardiology

## 2017-12-12 ENCOUNTER — Other Ambulatory Visit (HOSPITAL_COMMUNITY): Payer: Self-pay | Admitting: *Deleted

## 2017-12-12 ENCOUNTER — Other Ambulatory Visit: Payer: Self-pay | Admitting: Physician Assistant

## 2017-12-12 MED ORDER — SELEXIPAG 200 MCG PO TABS: 200.0000 ug | ORAL_TABLET | Freq: Two times a day (BID) | ORAL | 6 refills | Status: DC

## 2017-12-12 NOTE — Telephone Encounter (Signed)
Patient is needing a refill on Selexipag tabs insurance needs authorization

## 2017-12-12 NOTE — Telephone Encounter (Signed)
Pt is not a pt of [facility], she was calling dr Alford Highland office- cardiology, looked his ph# up and gave it to her

## 2017-12-15 ENCOUNTER — Telehealth (HOSPITAL_COMMUNITY): Payer: Self-pay | Admitting: Pharmacist

## 2017-12-15 NOTE — Telephone Encounter (Signed)
Opsumit PA approved by [facility] Part D through 03/06/19.   Tyler Deis. Bonnye Fava, PharmD, BCPS, CPP Clinical Pharmacist Phone: [phone] 12/15/2017 4:07 PM

## 2017-12-22 ENCOUNTER — Other Ambulatory Visit: Payer: Self-pay | Admitting: Physician Assistant

## 2017-12-26 ENCOUNTER — Telehealth: Payer: Self-pay | Admitting: *Deleted

## 2017-12-26 NOTE — Telephone Encounter (Signed)
Pharmacy is calling and wanting to verify that you are giving patient both hydrocodone and hycodan at the same time? Pharmacy is questioning the quantity.

## 2017-12-29 NOTE — Telephone Encounter (Signed)
Hydrocodone/apap #240 Hycodan #239ml

## 2017-12-29 NOTE — Telephone Encounter (Signed)
Pharmacy aware

## 2018-01-20 ENCOUNTER — Telehealth (HOSPITAL_COMMUNITY): Payer: Self-pay | Admitting: Pharmacist

## 2018-01-20 NOTE — Telephone Encounter (Signed)
Uptravi titration pack PA already on file with [employer].   Tyler Deis. Bonnye Fava, PharmD, BCPS, CPP Clinical Pharmacist Phone: [phone] 01/20/2018 12:43 PM

## 2018-01-22 ENCOUNTER — Other Ambulatory Visit (HOSPITAL_COMMUNITY): Payer: Self-pay | Admitting: *Deleted

## 2018-01-22 DIAGNOSIS — I270 Primary pulmonary hypertension: Secondary | ICD-10-CM

## 2018-01-22 MED ORDER — POTASSIUM CHLORIDE CRYS ER 20 MEQ PO TBCR: EXTENDED_RELEASE_TABLET | ORAL | 3 refills | Status: DC

## 2018-01-22 MED ORDER — RIOCIGUAT 2 MG PO TABS: 2.0000 mg | ORAL_TABLET | Freq: Three times a day (TID) | ORAL | 11 refills | Status: DC

## 2018-01-22 MED ORDER — SELEXIPAG 200 MCG PO TABS: 200.0000 ug | ORAL_TABLET | Freq: Two times a day (BID) | ORAL | 6 refills | Status: DC

## 2018-01-22 MED ORDER — MACITENTAN 10 MG PO TABS: 1.0000 | ORAL_TABLET | Freq: Every day | ORAL | 11 refills | Status: DC

## 2018-01-22 MED ORDER — PRAVASTATIN SODIUM 20 MG PO TABS: 20.0000 mg | ORAL_TABLET | Freq: Every day | ORAL | 3 refills | Status: DC

## 2018-01-23 ENCOUNTER — Other Ambulatory Visit (HOSPITAL_COMMUNITY): Payer: Self-pay | Admitting: *Deleted

## 2018-01-23 DIAGNOSIS — I270 Primary pulmonary hypertension: Secondary | ICD-10-CM

## 2018-01-23 MED ORDER — MACITENTAN 10 MG PO TABS: 1.0000 | ORAL_TABLET | Freq: Every day | ORAL | 3 refills | Status: DC

## 2018-01-26 ENCOUNTER — Telehealth (HOSPITAL_COMMUNITY): Payer: Self-pay | Admitting: Pharmacist

## 2018-01-26 NOTE — Telephone Encounter (Signed)
All strengths of Uptravi approved for Ms. Beamon by [facility] Part D through 12/08/18.   Tyler Deis. Bonnye Fava, PharmD, BCPS, CPP Clinical Pharmacist Phone: [phone] 01/26/2018 10:03 AM

## 2018-01-29 ENCOUNTER — Encounter (HOSPITAL_COMMUNITY): Payer: Self-pay | Admitting: Cardiology

## 2018-01-29 ENCOUNTER — Ambulatory Visit (HOSPITAL_COMMUNITY)
Admission: RE | Admit: 2018-01-29 | Discharge: 2018-01-29 | Disposition: A | Discharge status: Home / Self Care | Payer: Medicare PPO | Source: Ambulatory Visit | Attending: Cardiology | Admitting: Cardiology

## 2018-01-29 VITALS — BP 106/49 | HR 73 | Wt 232.8 lb

## 2018-01-29 DIAGNOSIS — I5032 Chronic diastolic (congestive) heart failure: Secondary | ICD-10-CM

## 2018-01-29 DIAGNOSIS — J9611 Chronic respiratory failure with hypoxia: Secondary | ICD-10-CM | POA: Diagnosis not present

## 2018-01-29 DIAGNOSIS — Z9981 Dependence on supplemental oxygen: Secondary | ICD-10-CM | POA: Diagnosis not present

## 2018-01-29 DIAGNOSIS — J449 Chronic obstructive pulmonary disease, unspecified: Secondary | ICD-10-CM | POA: Insufficient documentation

## 2018-01-29 DIAGNOSIS — D869 Sarcoidosis, unspecified: Secondary | ICD-10-CM

## 2018-01-29 DIAGNOSIS — Z79899 Other long term (current) drug therapy: Secondary | ICD-10-CM | POA: Insufficient documentation

## 2018-01-29 DIAGNOSIS — I2721 Secondary pulmonary arterial hypertension: Secondary | ICD-10-CM

## 2018-01-29 DIAGNOSIS — I50810 Right heart failure, unspecified: Secondary | ICD-10-CM | POA: Diagnosis not present

## 2018-01-29 DIAGNOSIS — Z7982 Long term (current) use of aspirin: Secondary | ICD-10-CM | POA: Diagnosis not present

## 2018-01-29 DIAGNOSIS — K219 Gastro-esophageal reflux disease without esophagitis: Secondary | ICD-10-CM | POA: Insufficient documentation

## 2018-01-29 DIAGNOSIS — I872 Venous insufficiency (chronic) (peripheral): Secondary | ICD-10-CM | POA: Insufficient documentation

## 2018-01-29 DIAGNOSIS — R590 Localized enlarged lymph nodes: Secondary | ICD-10-CM | POA: Insufficient documentation

## 2018-01-29 DIAGNOSIS — Z87891 Personal history of nicotine dependence: Secondary | ICD-10-CM | POA: Insufficient documentation

## 2018-01-29 DIAGNOSIS — E785 Hyperlipidemia, unspecified: Secondary | ICD-10-CM | POA: Insufficient documentation

## 2018-01-29 DIAGNOSIS — I2729 Other secondary pulmonary hypertension: Secondary | ICD-10-CM | POA: Diagnosis not present

## 2018-01-29 LAB — BASIC METABOLIC PANEL
Anion gap: 10 (ref 5–15)
BUN: 17 mg/dL (ref 6–20)
CHLORIDE: 100 mmol/L — AB (ref 101–111)
CO2: 27 mmol/L (ref 22–32)
Calcium: 8.8 mg/dL — ABNORMAL LOW (ref 8.9–10.3)
Creatinine, Ser: 1.39 mg/dL — ABNORMAL HIGH (ref 0.44–1.00)
GFR calc Af Amer: 46 mL/min — ABNORMAL LOW (ref >60)
GFR calc non Af Amer: 40 mL/min — ABNORMAL LOW (ref >60)
GLUCOSE: 118 mg/dL — AB (ref 65–99)
POTASSIUM: 4.2 mmol/L (ref 3.5–5.1)
SODIUM: 137 mmol/L (ref 135–145)

## 2018-01-29 MED ORDER — SELEXIPAG 200 MCG PO TABS: 400.0000 ug | ORAL_TABLET | Freq: Two times a day (BID) | ORAL | 6 refills | Status: DC

## 2018-01-29 NOTE — Patient Instructions (Signed)
Increase Uptravi 400 mcg (2 tabs), twice a day  Labs drawn today (if we do not call you, then your lab work was stable)   We did a 6 minute walk test  Your physician recommends that you schedule a follow-up appointment in: 3 months with [name]

## 2018-01-29 NOTE — Progress Notes (Signed)
Patient ID: Kelly Donaldson, female   DOB: March 02, 1956, 62 y.o.   MRN: [id number]   PCP: Prudy Feeler Pulmonology: Kelly Donaldson Primary HF: Kelly Donaldson   Kelly Donaldson is a 62 year old with history of chronic hypoxemic respiratory failure and pulmonary hypertension.  She has been followed by Kelly Donaldson, now follows with Kelly. Kelly Donaldson with pulmonology.  Has been on home oxygen for 6 years.  She quit smoking in 2016.  She had an echo done in 5/16 showing dilated RV with severe pulmonary hypertension.  She therefore had RHC in 5/16 which confirmed severe pulmonary hypertension and RV failure.  PFTs showed only mild obstruction and restriction with markedly decreased DLCO suggestive of pulmonary vascular disease.  There was concern for sarcoidosis on CT chest but lymph node biopsy showed no evidence for sarcoidosis.  V/Q scan showed no evidence for chronic PE.   She has not tolerated Revatio or Adcirca.  Have failed previous up-titration of Selexipag with pelvic pain, can tolerate 200 mcg bid.  She is now tolerating riociguat 2 mg tid.   Started torsemide in Oct. 2017, has had better diuresis.   Pt presents today for followup of pulmonary hypertension.  She was out of Uptravi for a week but has just restarted today.  She is overall feeling well.  No dyspnea walking on flat ground with her oxygen.  She is doing pulmonary rehab at [facility].  Mild dyspnea with stairs, inclines.  No chest pain.  No lightheadeness.  Weight is down 11 lbs.   6 minute walk (5/16): 238 meters => decreased oxygen saturation to 73%, increased to 6 L [place] 6 minute walk (7/16): 170 meters => unable to complete.  6 minute walk (9/16): 201 meters 6 minute walk (1/17): 256 meters 6 minute walk (4/17): 219 meters 6 minute walk (8/17): 225 meters 6 minute walk (11/17): 244 meters 6 minute walk (5/18): 229 meters 6 minute walk (2/19): 122 meters (but she was stopped halfway through with drop in oxygen saturation.   Labs (2/16): BNP 69 Labs (5/16): LFTs  normal, HCT 34.9, RF negative, HIV negative, TSH negative Labs (6/16): K 4.4, creatinine 0.88 Labs (7/16): ANA negative, BNP 58, K 4.3, creatinine 1.1, anti-RNP negative Labs (9/16): K 4.4, creatinine 1.26, BNP 91 Labs (10/16): K 4.3, creatinine 1.04 Labs (12/16): K 4.2, creatinine 1.07, BNP 130 Labs (1/17): K 4.3, creatinine 1.23, BNP 42 Labs (3/17): K 4.2, creatinine 1.19 Labs (5/17): K 4.3, creatinine 1.35, BNP 44 Labs (6/17): K 4.4, creatinine 1.18, BNP 44 Labs (10/17): K 4.2, creatinine 0.98, BNP 43 Labs (12/17): K 4.5, creatinine 1.23. BNP 64.5 Labs (2/18): K 4.5, creatinine 1.21, BNP 64 Labs (4/18): K 4.1, creatinine 1.24, BNP 26 Labs (9/18): K 4.6, creatinine 1.25 . PMH: 1. Pulmonary hypertension: RHC (5/16) with mean RA 18, PA 83/33 mean 53, mean PCWP 19, CI 2.61, PVR 5.8 WU.   Echo (5/16) with EF 65-70%, septal flattening, dilated RV with PA systolic pressure 99 mmHg, +bubble study.  PFTs (4/16) with FVC 76%, FEV1 75%, ratio 97%, TLC 66%, DLCO 26% => mild restriction, mild obstruction, severe diffuse abnormality (pulmonary vascular problem).  CTA chest (4/16) with chronic interstitial and obstructive lung disease, small mediastinal and hilar lymph noes, no PE.  Lymph node biopsy negative for sarcoidosis (reactive changes).  V/Q scan (6/16): No acute or chronic PE. TEE (6/16): Normal LV size and systolic function, EF 55-60%,RV was mildly dilated with mildly decreased systolic function, D-shaped interventricular septum suggestive of  RV pressure/volume overload, there appeared to be a small PFO present with some bubbles crossing, no ASD.  ANA negative, anti-RNP negative, HIV negative. Sleep study (8/16) without OSA.  She did not tolerate Adcirca or Revatio.  She cannot tolerate more than 200 mcg bid Selexipag.  - Echo (6/17): EF 60-65%, normal diastolic function, D-shaped interventricular septum, mildly dilated RV with normal RV systolic function, PASP 39, IVC normal.  - Echo (6/18): EF  60-65%, normal diastolic function, D-shaped interventricular septum, mildly dilated RV with normal RV systolic function, PASP 65 mmHg.  - RHC (7/18): RA 13, PA 51/23 mean 36, PCWP 19, CI 4.74, PVR 1.6 WU 2. Chronic venous insufficiency. 3. Hyperlipidemia.  4. GERD 5. Chronic hypoxemic respiratory failure: Hypoxemia noted x 6 years at least.  Seen by Kelly Donaldson, has emphysema + interstitial lung disease (?UIP).   SH: Married, quit smoking in 3/16.  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    FH: No pulmonary hypertension, no premature CAD, no sudden death.   Review of systems complete and found to be negative unless listed in HPI.    Current Outpatient Medications  Medication Sig Dispense Refill  . aspirin EC 81 MG tablet Take 1 tablet (81 mg total) by mouth daily. 90 tablet 3  . cetirizine (ZYRTEC) 10 MG tablet TAKE 1 TABLET (10 MG TOTAL) BY MOUTH DAILY. 90 tablet 0  . citalopram (CELEXA) 40 MG tablet TAKE 1 TABLET (40 MG TOTAL) BY MOUTH DAILY. 90 tablet 0  . HYDROcodone-acetaminophen (NORCO) 10-325 MG tablet Take 1-2 tablets by mouth every 6 (six) hours as needed. 240 tablet 0  . HYDROcodone-acetaminophen (NORCO) 10-325 MG tablet Take 1-2 tablets by mouth every 6 (six) hours as needed. 240 tablet 0  . HYDROcodone-acetaminophen (NORCO) 10-325 MG tablet Take 1-2 tablets by mouth every 6 (six) hours as needed. 240 tablet 0  . HYDROcodone-homatropine (HYCODAN) 5-1.5 MG/5ML syrup Take 5-10 mLs by mouth every 6 (six) hours as needed. 240 mL 0  . HYDROcodone-homatropine (HYCODAN) 5-1.5 MG/5ML syrup Take 5-10 mLs by mouth every 6 (six) hours as needed. 240 mL 0  . HYDROcodone-homatropine (HYCODAN) 5-1.5 MG/5ML syrup Take 5 mLs by mouth every 6 (six) hours as needed for cough. 120 mL 0  . Ipratropium-Albuterol (COMBIVENT RESPIMAT) 20-100 MCG/ACT AERS respimat Inhale 1 puff into the lungs every 6 (six) hours as needed for wheezing. 3 Inhaler 3  . ipratropium-albuterol (DUONEB) 0.5-2.5 (3)  MG/3ML SOLN Take 3 mLs by nebulization 4 (four) times daily as needed. (Patient taking differently: Take 3 mLs by nebulization 4 (four) times daily as needed (for wheezing/shortness of breath). ) 360 mL 11  . Macitentan (OPSUMIT) 10 MG TABS Take 1 tablet (10 mg total) by mouth daily. 90 tablet 3  . meloxicam (MOBIC) 7.5 MG tablet Take 7.5 mg by mouth at bedtime.    . OXYGEN 2.5 lpm with sleep and 4 lpm with exertion  [facility]    . potassium chloride SA (KLOR-CON M20) 20 MEQ tablet TAKE 1 TABLET (20 MEQ TOTAL) BY MOUTH 2 (TWO) TIMES DAILY. 180 tablet 3  . pravastatin (PRAVACHOL) 20 MG tablet Take 1 tablet (20 mg total) by mouth daily. 90 tablet 3  . Riociguat (ADEMPAS) 2 MG TABS Take 2 mg by mouth 3 (three) times daily. 90 tablet 11  . Selexipag 200 MCG TABS Take 2 tablets (400 mcg total) by mouth 2 (two) times daily. 120 tablet 6  . torsemide (DEMADEX) 20 MG tablet Take 2 tablets (40 mg  total) by mouth 2 (two) times daily. 360 tablet 3  . Vitamin D, Ergocalciferol, (DRISDOL) 50000 units CAPS capsule TAKE ONE CAPSULE BY MOUTH ONCE WEEKLY FOR 30 DAYS 12 capsule 3  . zolpidem (AMBIEN) 5 MG tablet TAKE ONE TABLET AT BEDTIME 30 tablet 2   No current facility-administered medications for this encounter.    BP (!) 106/49 (BP Location: Left Arm, Patient Position: Sitting, Cuff Size: Large)   Pulse 73   Wt 232 lb 12.8 oz (105.6 kg)   SpO2 90% Comment: on 4L  BMI 36.46 kg/m   Wt Readings from Last 3 Encounters:  01/29/18 232 lb 12.8 oz (105.6 kg)  11/28/17 241 lb (109.3 kg)  11/21/17 241 lb (109.3 kg)    General: NAD Neck: No JVD, no thyromegaly or thyroid nodule.  Lungs: Clear to auscultation bilaterally with normal respiratory effort. CV: Nondisplaced PMI.  Heart regular S1/S2, no S3/S4, 2/6 early SEM RUSB.  1+ ankle edema.  No carotid bruit.  Normal pedal pulses.  Abdomen: Soft, nontender, no hepatosplenomegaly, no distention.  Skin: Intact without lesions or rashes.  Neurologic: Alert  and oriented x 3.  Psych: Normal affect. Extremities: No clubbing or cyanosis.  HEENT: Normal.   Assessment/Plan:  1. Pulmonary arterial hypertension with RV failure: Severe on 5/16 RHC. 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  There was concern from CTA chest for sarcoidosis, but lymph node biopsy was negative.  LFTs normal, ANA negative, anti-RNP negative, HIV negative.  V/Q scan negative for chronic PE.  Small PFO but no ASD on TEE.  No OSA on sleep study.  She did not tolerate Revatio due to joint pain (now resolved), and she did not tolerate Adcirca with worsening dyspnea.  She does not tolerate more than 200 mcg bid Selexipag due to pelvic pain.  Echo 6/17 showed mildly dilated RV with normal systolic function and PASP appeared lower at 39 mmHg. Echo in 6/18 showed mildly dilated RV with normal systolic function, but PASP appeared higher than prior at 65 mmHg.  However, repeat RHC in 7/18 showed primarily pulmonary venous hypertension with PVR only 1.6 WU.  - Open lung biopsy deemed too risky, probably not sarcoid.    - Continue opsumit and riociguat 2 mg twice a day (unable to go higher due to dizziness).   - She has tolerated selexipag 200 mcg bid. I will try again to increase selexipag to 400 mcg bid.  - 6 minute walk done today, stopped mid-way due to decreased oxygen saturation.  Was not able to increase oxygen to keep walking.   2. Chronic hypoxemic respiratory failure: Chronic interstitial and obstructive lung disease, mediastinal and hilar lymphadenopathy.  Concern for sarcoidosis.  PFTs showed only mild restriction and obstruction.  Biopsy did not show sarcoid.  Deemed too high risk for open lung biopsy.  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 - O2 sats stable. Continue current regimen.  - She follows with Kelly. Kelly Donaldson in pulmonary, needs repeat appt => scheduled for 3/19.  3. Chronic  diastolic CHF: Weight is down and she is not volume overloaded on exam.    - Continue torsemide 40 mg bid.  BMET today.    Followup in 3 months.   [name], MD  01/29/2018

## 2018-01-29 NOTE — Progress Notes (Addendum)
Pt on 84% on RA before walk. Pt completed 6 minute walk test. Pt walked 400 ft (122 meters). O2 sats ranged from 94%-80%, and HR ranged from 91-112. Pt rested for 3 minutes of walk and oxygen was increase from 3-5 L [place].  After rest Pt was O2 Sats increase to 94 on 3 LNC.No new orders at this time.

## 2018-02-09 DIAGNOSIS — R531 Weakness: Secondary | ICD-10-CM | POA: Diagnosis not present

## 2018-02-09 DIAGNOSIS — Z9981 Dependence on supplemental oxygen: Secondary | ICD-10-CM | POA: Diagnosis not present

## 2018-02-09 DIAGNOSIS — J181 Lobar pneumonia, unspecified organism: Secondary | ICD-10-CM | POA: Diagnosis not present

## 2018-02-09 DIAGNOSIS — J440 Chronic obstructive pulmonary disease with acute lower respiratory infection: Secondary | ICD-10-CM | POA: Diagnosis not present

## 2018-02-09 DIAGNOSIS — R0902 Hypoxemia: Secondary | ICD-10-CM | POA: Diagnosis not present

## 2018-02-09 DIAGNOSIS — D509 Iron deficiency anemia, unspecified: Secondary | ICD-10-CM | POA: Diagnosis not present

## 2018-02-09 DIAGNOSIS — I272 Pulmonary hypertension, unspecified: Secondary | ICD-10-CM | POA: Diagnosis not present

## 2018-02-09 DIAGNOSIS — R509 Fever, unspecified: Secondary | ICD-10-CM | POA: Diagnosis not present

## 2018-02-09 DIAGNOSIS — J189 Pneumonia, unspecified organism: Secondary | ICD-10-CM | POA: Diagnosis not present

## 2018-02-09 DIAGNOSIS — I5032 Chronic diastolic (congestive) heart failure: Secondary | ICD-10-CM | POA: Diagnosis not present

## 2018-02-09 DIAGNOSIS — J441 Chronic obstructive pulmonary disease with (acute) exacerbation: Secondary | ICD-10-CM | POA: Diagnosis not present

## 2018-02-09 DIAGNOSIS — I739 Peripheral vascular disease, unspecified: Secondary | ICD-10-CM | POA: Diagnosis not present

## 2018-02-09 DIAGNOSIS — K219 Gastro-esophageal reflux disease without esophagitis: Secondary | ICD-10-CM | POA: Diagnosis not present

## 2018-02-09 DIAGNOSIS — Z87891 Personal history of nicotine dependence: Secondary | ICD-10-CM | POA: Diagnosis not present

## 2018-02-09 DIAGNOSIS — I509 Heart failure, unspecified: Secondary | ICD-10-CM | POA: Diagnosis not present

## 2018-02-09 DIAGNOSIS — R0602 Shortness of breath: Secondary | ICD-10-CM | POA: Diagnosis not present

## 2018-02-20 ENCOUNTER — Ambulatory Visit (INDEPENDENT_AMBULATORY_CARE_PROVIDER_SITE_OTHER): Payer: Medicare PPO | Admitting: Physician Assistant

## 2018-02-20 ENCOUNTER — Encounter: Payer: Self-pay | Admitting: Physician Assistant

## 2018-02-20 ENCOUNTER — Telehealth (HOSPITAL_COMMUNITY): Payer: Self-pay | Admitting: Pharmacist

## 2018-02-20 DIAGNOSIS — M5136 Other intervertebral disc degeneration, lumbar region: Secondary | ICD-10-CM

## 2018-02-20 DIAGNOSIS — J40 Bronchitis, not specified as acute or chronic: Secondary | ICD-10-CM | POA: Diagnosis not present

## 2018-02-20 MED ORDER — HYDROCODONE-ACETAMINOPHEN 10-325 MG PO TABS: 1.0000 | ORAL_TABLET | Freq: Four times a day (QID) | ORAL | 0 refills | Status: AC | PRN

## 2018-02-20 MED ORDER — CEFDINIR 300 MG PO CAPS: 300.0000 mg | ORAL_CAPSULE | Freq: Two times a day (BID) | ORAL | 0 refills | Status: DC

## 2018-02-20 MED ORDER — HYDROCODONE-ACETAMINOPHEN 10-325 MG PO TABS
1.0000 | ORAL_TABLET | Freq: Four times a day (QID) | ORAL | 0 refills | Status: AC | PRN
Start: 2018-02-20 — End: 2018-03-22

## 2018-02-20 MED ORDER — ZOLPIDEM TARTRATE 5 MG PO TABS: 5.0000 mg | ORAL_TABLET | Freq: Every day | ORAL | 5 refills | Status: DC

## 2018-02-20 MED ORDER — HYDROCODONE-ACETAMINOPHEN 10-325 MG PO TABS: 1.0000 | ORAL_TABLET | Freq: Four times a day (QID) | ORAL | 0 refills | Status: DC | PRN

## 2018-02-20 MED ORDER — HYDROCODONE-HOMATROPINE 5-1.5 MG/5ML PO SYRP: 5.0000 mL | ORAL_SOLUTION | Freq: Four times a day (QID) | ORAL | 0 refills | Status: AC | PRN

## 2018-02-20 NOTE — Telephone Encounter (Signed)
[employer] patient assistance approved for Uptravi and Opsumit through 02/20/18.   Tyler Deis. Bonnye Fava, PharmD, BCPS, CPP Clinical Pharmacist Phone: [phone] 02/20/2018 3:06 PM

## 2018-02-22 NOTE — Progress Notes (Signed)
BP (!) 75/52   Pulse 64   Temp 97.7 F (36.5 C) (Oral)   Ht 5\' 7"  (1.702 m)   Wt 233 lb 9.6 oz (106 kg)   BMI 36.59 kg/m    Subjective:    Patient ID: , female    DOB: [birth year], 62 y.o.   MRN: [id number]  HPI: Kelly Donaldson is a 62 y.o. female presenting on 02/20/2018 for Follow-up (3 month )  Patient with several days of progressing upper respiratory and bronchial symptoms. Initially there was more upper respiratory congestion. This progressed to having significant cough that is productive throughout the day and severe at night. There is occasional wheezing after coughing. Sometimes there is slight dyspnea on exertion. It is productive mucus that is yellow in color. Denies any blood.  Chronic pain is under control, patient has no new complaints. Medications are keeping things stable. Needs refills for the next three months.  [place] Controlled Substance website checked and normal. Drug screen normal this year.   Past Medical History:  Diagnosis Date  . Allergy   . Depression   . GERD (gastroesophageal reflux disease)   . Hyperlipidemia   . Oxygen dependent   . Shortness of breath dyspnea   . Varicose veins    Relevant past medical, surgical, family and social history reviewed and updated as indicated. Interim medical history since our last visit reviewed. Allergies and medications reviewed and updated. DATA REVIEWED: CHART IN EPIC  Family History reviewed for pertinent findings.  Review of Systems  Constitutional: Positive for chills and fatigue. Negative for activity change and appetite change.  HENT: Positive for congestion, postnasal drip and sore throat.   Eyes: Negative.   Respiratory: Positive for cough and wheezing.   Cardiovascular: Negative.  Negative for chest pain, palpitations and leg swelling.  Gastrointestinal: Negative.   Genitourinary: Negative.   Musculoskeletal: Negative.   Skin: Negative.   Neurological: Positive for headaches.     Allergies as of 02/20/2018      Reactions   Gabapentin    Pt states that she can not take with antidepressants   Latex Rash      Medication List        Accurate as of 02/20/18 11:59 PM. Always use your most recent med list.          aspirin EC 81 MG tablet Take 1 tablet (81 mg total) by mouth daily.   cefdinir 300 MG capsule Commonly known as:  OMNICEF Take 1 capsule (300 mg total) by mouth 2 (two) times daily. 1 po BID   cetirizine 10 MG tablet Commonly known as:  ZYRTEC TAKE 1 TABLET (10 MG TOTAL) BY MOUTH DAILY.   citalopram 40 MG tablet Commonly known as:  CELEXA TAKE 1 TABLET (40 MG TOTAL) BY MOUTH DAILY.   HYDROcodone-acetaminophen 10-325 MG tablet Commonly known as:  NORCO Take 1-2 tablets by mouth every 6 (six) hours as needed.   HYDROcodone-acetaminophen 10-325 MG tablet Commonly known as:  NORCO Take 1-2 tablets by mouth every 6 (six) hours as needed. Start taking on:  03/21/2018   HYDROcodone-acetaminophen 10-325 MG tablet Commonly known as:  NORCO Take 1-2 tablets by mouth every 6 (six) hours as needed. Start taking on:  04/18/2018   HYDROcodone-homatropine 5-1.5 MG/5ML syrup Commonly known as:  HYCODAN Take 5-10 mLs by mouth every 6 (six) hours as needed for up to 8 days.   ipratropium-albuterol 0.5-2.5 (3) MG/3ML Soln Commonly known as:  DUONEB Take 3  mLs by nebulization 4 (four) times daily as needed.   Ipratropium-Albuterol 20-100 MCG/ACT Aers respimat Commonly known as:  COMBIVENT RESPIMAT Inhale 1 puff into the lungs every 6 (six) hours as needed for wheezing.   Macitentan 10 MG Tabs Commonly known as:  OPSUMIT Take 1 tablet (10 mg total) by mouth daily.   meloxicam 7.5 MG tablet Commonly known as:  MOBIC Take 7.5 mg by mouth at bedtime.   OXYGEN 2.5 lpm with sleep and 4 lpm with exertion  [facility]   potassium chloride SA 20 MEQ tablet Commonly known as:  KLOR-CON M20 TAKE 1 TABLET (20 MEQ TOTAL) BY MOUTH 2 (TWO) TIMES  DAILY.   pravastatin 20 MG tablet Commonly known as:  PRAVACHOL Take 1 tablet (20 mg total) by mouth daily.   Riociguat 2 MG Tabs Commonly known as:  ADEMPAS Take 2 mg by mouth 3 (three) times daily.   Selexipag 200 MCG Tabs Take 2 tablets (400 mcg total) by mouth 2 (two) times daily.   torsemide 20 MG tablet Commonly known as:  DEMADEX Take 2 tablets (40 mg total) by mouth 2 (two) times daily.   Vitamin D (Ergocalciferol) 50000 units Caps capsule Commonly known as:  DRISDOL TAKE ONE CAPSULE BY MOUTH ONCE WEEKLY FOR 30 DAYS   zolpidem 5 MG tablet Commonly known as:  AMBIEN Take 1 tablet (5 mg total) by mouth at bedtime.          Objective:    BP (!) 75/52   Pulse 64   Temp 97.7 F (36.5 C) (Oral)   Ht 5\' 7"  (1.702 m)   Wt 233 lb 9.6 oz (106 kg)   BMI 36.59 kg/m   Allergies  Allergen Reactions  . Gabapentin     Pt states that she can not take with antidepressants  . Latex Rash    Wt Readings from Last 3 Encounters:  02/20/18 233 lb 9.6 oz (106 kg)  01/29/18 232 lb 12.8 oz (105.6 kg)  11/28/17 241 lb (109.3 kg)    Physical Exam  Constitutional: She is oriented to person, place, and time. She appears well-developed and well-nourished.  HENT:  Head: Normocephalic and atraumatic.  Right Ear: There is drainage and tenderness.  Left Ear: There is drainage and tenderness.  Nose: Mucosal edema and rhinorrhea present. Right sinus exhibits no maxillary sinus tenderness and no frontal sinus tenderness. Left sinus exhibits no maxillary sinus tenderness and no frontal sinus tenderness.  Mouth/Throat: Oropharyngeal exudate and posterior oropharyngeal erythema present.  Eyes: Conjunctivae and EOM are normal. Pupils are equal, round, and reactive to light.  Neck: Normal range of motion. Neck supple.  Cardiovascular: Normal rate, regular rhythm, normal heart sounds and intact distal pulses.  Pulmonary/Chest: Effort normal. She has wheezes in the right upper field and the  left upper field.  Abdominal: Soft. Bowel sounds are normal.  Neurological: She is alert and oriented to person, place, and time. She has normal reflexes.  Skin: Skin is warm and dry. No rash noted.  Psychiatric: She has a normal mood and affect. Her behavior is normal. Judgment and thought content normal.    Results for orders placed or performed during the hospital encounter of 01/29/18  Basic metabolic panel  Result Value Ref Range   Sodium 137 135 - 145 mmol/L   Potassium 4.2 3.5 - 5.1 mmol/L   Chloride 100 (L) 101 - 111 mmol/L   CO2 27 22 - 32 mmol/L   Glucose, Bld 118 (H) 65 -  99 mg/dL   BUN 17 6 - 20 mg/dL   Creatinine, Ser 9.62 (H) 0.44 - 1.00 mg/dL   Calcium 8.8 (L) 8.9 - 10.3 mg/dL   GFR calc non Af Amer 40 (L) >60 mL/min   GFR calc Af Amer 46 (L) >60 mL/min   Anion gap 10 5 - 15      Assessment & Plan:   1. DDD (degenerative disc disease), lumbar - HYDROcodone-acetaminophen (NORCO) 10-325 MG tablet; Take 1-2 tablets by mouth every 6 (six) hours as needed.  Dispense: 240 tablet; Refill: 0 - HYDROcodone-acetaminophen (NORCO) 10-325 MG tablet; Take 1-2 tablets by mouth every 6 (six) hours as needed.  Dispense: 240 tablet; Refill: 0 - HYDROcodone-acetaminophen (NORCO) 10-325 MG tablet; Take 1-2 tablets by mouth every 6 (six) hours as needed.  Dispense: 240 tablet; Refill: 0  2. Bronchitis - HYDROcodone-homatropine (HYCODAN) 5-1.5 MG/5ML syrup; Take 5-10 mLs by mouth every 6 (six) hours as needed for up to 8 days.  Dispense: 240 mL; Refill: 0 - cefdinir (OMNICEF) 300 MG capsule; Take 1 capsule (300 mg total) by mouth 2 (two) times daily. 1 po BID  Dispense: 20 capsule; Refill: 0   Continue all other maintenance medications as listed above.  Follow up plan: Return in about 3 months (around 05/23/2018) for recheck.  Educational handout given for survey  Remus Loffler PA-C [facility] [address]  [place], [place]  [address] [phone]   02/22/2018, 9:02 PM

## 2018-02-23 ENCOUNTER — Other Ambulatory Visit: Payer: Self-pay | Admitting: Physician Assistant

## 2018-02-24 ENCOUNTER — Other Ambulatory Visit (HOSPITAL_COMMUNITY): Payer: Self-pay | Admitting: Pharmacist

## 2018-02-24 DIAGNOSIS — I270 Primary pulmonary hypertension: Secondary | ICD-10-CM

## 2018-02-24 MED ORDER — MACITENTAN 10 MG PO TABS: 1.0000 | ORAL_TABLET | Freq: Every day | ORAL | 3 refills | Status: DC

## 2018-03-10 ENCOUNTER — Telehealth: Payer: Self-pay | Admitting: Physician Assistant

## 2018-03-10 NOTE — Telephone Encounter (Signed)
Faxed records to [facility]

## 2018-03-11 ENCOUNTER — Other Ambulatory Visit: Payer: Self-pay | Admitting: Physician Assistant

## 2018-04-01 ENCOUNTER — Encounter (HOSPITAL_COMMUNITY): Payer: Self-pay

## 2018-04-02 NOTE — Addendum Note (Signed)
Encounter addended by: Teresa Coombs, RN on: 04/02/2018 11:27 AM  Actions taken: Sign clinical note

## 2018-04-13 DIAGNOSIS — J449 Chronic obstructive pulmonary disease, unspecified: Secondary | ICD-10-CM | POA: Diagnosis not present

## 2018-04-14 ENCOUNTER — Encounter (HOSPITAL_COMMUNITY): Payer: Self-pay

## 2018-04-28 ENCOUNTER — Other Ambulatory Visit: Payer: Self-pay | Admitting: Physician Assistant

## 2018-04-29 NOTE — Telephone Encounter (Signed)
OV 05/29/18

## 2018-04-30 ENCOUNTER — Other Ambulatory Visit: Payer: Self-pay

## 2018-04-30 ENCOUNTER — Encounter (HOSPITAL_COMMUNITY): Payer: Self-pay

## 2018-04-30 ENCOUNTER — Ambulatory Visit (HOSPITAL_COMMUNITY)
Admission: RE | Admit: 2018-04-30 | Discharge: 2018-04-30 | Disposition: A | Discharge status: Home / Self Care | Payer: Medicare PPO | Source: Ambulatory Visit | Attending: Cardiology | Admitting: Cardiology

## 2018-04-30 ENCOUNTER — Encounter: Payer: Self-pay | Admitting: *Deleted

## 2018-04-30 ENCOUNTER — Other Ambulatory Visit (HOSPITAL_COMMUNITY): Payer: Self-pay

## 2018-04-30 ENCOUNTER — Encounter (HOSPITAL_COMMUNITY): Payer: Self-pay | Admitting: Cardiology

## 2018-04-30 VITALS — BP 104/50 | HR 73 | Wt 225.0 lb

## 2018-04-30 DIAGNOSIS — Z7982 Long term (current) use of aspirin: Secondary | ICD-10-CM | POA: Diagnosis not present

## 2018-04-30 DIAGNOSIS — Z79899 Other long term (current) drug therapy: Secondary | ICD-10-CM | POA: Insufficient documentation

## 2018-04-30 DIAGNOSIS — Z791 Long term (current) use of non-steroidal anti-inflammatories (NSAID): Secondary | ICD-10-CM | POA: Diagnosis not present

## 2018-04-30 DIAGNOSIS — R102 Pelvic and perineal pain: Secondary | ICD-10-CM | POA: Insufficient documentation

## 2018-04-30 DIAGNOSIS — I2721 Secondary pulmonary arterial hypertension: Secondary | ICD-10-CM | POA: Diagnosis not present

## 2018-04-30 DIAGNOSIS — I110 Hypertensive heart disease with heart failure: Secondary | ICD-10-CM | POA: Insufficient documentation

## 2018-04-30 DIAGNOSIS — Z87891 Personal history of nicotine dependence: Secondary | ICD-10-CM | POA: Insufficient documentation

## 2018-04-30 DIAGNOSIS — J449 Chronic obstructive pulmonary disease, unspecified: Secondary | ICD-10-CM | POA: Insufficient documentation

## 2018-04-30 DIAGNOSIS — R590 Localized enlarged lymph nodes: Secondary | ICD-10-CM | POA: Diagnosis not present

## 2018-04-30 DIAGNOSIS — I872 Venous insufficiency (chronic) (peripheral): Secondary | ICD-10-CM | POA: Diagnosis not present

## 2018-04-30 DIAGNOSIS — J984 Other disorders of lung: Secondary | ICD-10-CM | POA: Diagnosis not present

## 2018-04-30 DIAGNOSIS — Z9981 Dependence on supplemental oxygen: Secondary | ICD-10-CM | POA: Insufficient documentation

## 2018-04-30 DIAGNOSIS — J84112 Idiopathic pulmonary fibrosis: Secondary | ICD-10-CM | POA: Insufficient documentation

## 2018-04-30 DIAGNOSIS — R42 Dizziness and giddiness: Secondary | ICD-10-CM | POA: Diagnosis not present

## 2018-04-30 DIAGNOSIS — E785 Hyperlipidemia, unspecified: Secondary | ICD-10-CM | POA: Insufficient documentation

## 2018-04-30 DIAGNOSIS — J9611 Chronic respiratory failure with hypoxia: Secondary | ICD-10-CM

## 2018-04-30 DIAGNOSIS — I272 Pulmonary hypertension, unspecified: Secondary | ICD-10-CM

## 2018-04-30 DIAGNOSIS — Z006 Encounter for examination for normal comparison and control in clinical research program: Secondary | ICD-10-CM

## 2018-04-30 DIAGNOSIS — I5032 Chronic diastolic (congestive) heart failure: Secondary | ICD-10-CM | POA: Diagnosis not present

## 2018-04-30 LAB — BASIC METABOLIC PANEL
Anion gap: 8 (ref 5–15)
BUN: 21 mg/dL — AB (ref 6–20)
CALCIUM: 9 mg/dL (ref 8.9–10.3)
CO2: 30 mmol/L (ref 22–32)
CREATININE: 1.4 mg/dL — AB (ref 0.44–1.00)
Chloride: 102 mmol/L (ref 101–111)
GFR calc Af Amer: 46 mL/min — ABNORMAL LOW (ref >60)
GFR, EST NON AFRICAN AMERICAN: 39 mL/min — AB (ref >60)
Glucose, Bld: 98 mg/dL (ref 65–99)
Potassium: 4 mmol/L (ref 3.5–5.1)
SODIUM: 140 mmol/L (ref 135–145)

## 2018-04-30 MED ORDER — TORSEMIDE 20 MG PO TABS: ORAL_TABLET | ORAL | 3 refills | Status: DC

## 2018-04-30 NOTE — Progress Notes (Signed)
Patient ID: Kelly Donaldson, female   DOB: 10/31/1956, 62 y.o.   MRN: [id number]   PCP: Prudy Feeler Pulmonology: Dr [name] Primary HF: Dr Shirlee Latch   Kelly Donaldson is a 62 y.o. with history of chronic hypoxemic respiratory failure and pulmonary hypertension.  She has been followed by Dr [name], now follows with Dr. [name] with pulmonology.  Has been on home oxygen for several years now.  She quit smoking in 2016.  She had an echo done in 5/16 showing dilated RV with severe pulmonary hypertension.  She therefore had RHC in 5/16 which confirmed severe pulmonary hypertension and RV failure.  PFTs showed only mild obstruction and restriction with markedly decreased DLCO suggestive of pulmonary vascular disease.  There was concern for sarcoidosis on CT chest but lymph node biopsy showed no evidence for sarcoidosis.  V/Q scan showed no evidence for chronic PE.   She has not tolerated Revatio or Adcirca.  Have failed previous up-titration of Selexipag with pelvic pain, can tolerate 200 mcg bid.  She is now tolerating riociguat 2 mg tid.   Started torsemide in Oct. 2017, has had better diuresis.   Pt presents today for followup of pulmonary hypertension.  She does not feel like she has made much progress with her breathing.  She continues to do maintenance pulmonary rehab at [facility].  She is short of breath changing the bed linen, vacuuming, and with other moderate housework. She still cannot walk long distances without stopping to rest.  No orthopnea/PND. No chest pain.  No lightheadedness. She wore flip-flops again today so does not think she can do the 6 minute walk.  Weight is down 7 lbs.   6 minute walk (5/16): 238 meters => decreased oxygen saturation to 73%, increased to 6 L [place] 6 minute walk (7/16): 170 meters => unable to complete.  6 minute walk (9/16): 201 meters 6 minute walk (1/17): 256 meters 6 minute walk (4/17): 219 meters 6 minute walk (8/17): 225 meters 6 minute walk (11/17): 244 meters 6 minute  walk (5/18): 229 meters 6 minute walk (2/19): 122 meters (but she was stopped halfway through with drop in oxygen saturation.   Labs (2/16): BNP 69 Labs (5/16): LFTs normal, HCT 34.9, RF negative, HIV negative, TSH negative Labs (6/16): K 4.4, creatinine 0.88 Labs (7/16): ANA negative, BNP 58, K 4.3, creatinine 1.1, anti-RNP negative Labs (9/16): K 4.4, creatinine 1.26, BNP 91 Labs (10/16): K 4.3, creatinine 1.04 Labs (12/16): K 4.2, creatinine 1.07, BNP 130 Labs (1/17): K 4.3, creatinine 1.23, BNP 42 Labs (3/17): K 4.2, creatinine 1.19 Labs (5/17): K 4.3, creatinine 1.35, BNP 44 Labs (6/17): K 4.4, creatinine 1.18, BNP 44 Labs (10/17): K 4.2, creatinine 0.98, BNP 43 Labs (12/17): K 4.5, creatinine 1.23. BNP 64.5 Labs (2/18): K 4.5, creatinine 1.21, BNP 64 Labs (4/18): K 4.1, creatinine 1.24, BNP 26 Labs (9/18): K 4.6, creatinine 1.25 Labs (2/19): K 4.2, creatinine 1.39 . PMH: 1. Pulmonary hypertension: RHC (5/16) with mean RA 18, PA 83/33 mean 53, mean PCWP 19, CI 2.61, PVR 5.8 WU.   Echo (5/16) with EF 65-70%, septal flattening, dilated RV with PA systolic pressure 99 mmHg, +bubble study.  PFTs (4/16) with FVC 76%, FEV1 75%, ratio 97%, TLC 66%, DLCO 26% => mild restriction, mild obstruction, severe diffuse abnormality (pulmonary vascular problem).  CTA chest (4/16) with chronic interstitial and obstructive lung disease, small mediastinal and hilar lymph noes, no PE.  Lymph node biopsy negative for sarcoidosis (reactive changes).  V/Q  scan (6/16): No acute or chronic PE. TEE (6/16): Normal LV size and systolic function, EF 55-60%,RV was mildly dilated with mildly decreased systolic function, D-shaped interventricular septum suggestive of RV pressure/volume overload, there appeared to be a small PFO present with some bubbles crossing, no ASD.  ANA negative, anti-RNP negative, HIV negative. Sleep study (8/16) without OSA.  She did not tolerate Adcirca or Revatio.  She cannot tolerate more than  200 mcg bid Selexipag.  - Echo (6/17): EF 60-65%, normal diastolic function, D-shaped interventricular septum, mildly dilated RV with normal RV systolic function, PASP 39, IVC normal.  - Echo (6/18): EF 60-65%, normal diastolic function, D-shaped interventricular septum, mildly dilated RV with normal RV systolic function, PASP 65 mmHg.  - RHC (7/18): RA 13, PA 51/23 mean 36, PCWP 19, CI 4.74, PVR 1.6 WU 2. Chronic venous insufficiency. 3. Hyperlipidemia.  4. GERD 5. Chronic hypoxemic respiratory failure: Hypoxemia noted x 6 years at least.  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   SH: Married, quit smoking in 3/16.  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    FH: No pulmonary hypertension, no premature CAD, no sudden death.   Review of systems complete and found to be negative unless listed in HPI.    Current Outpatient Medications  Medication Sig Dispense Refill  . aspirin EC 81 MG tablet Take 1 tablet (81 mg total) by mouth daily. 90 tablet 3  . cefdinir (OMNICEF) 300 MG capsule Take 1 capsule (300 mg total) by mouth 2 (two) times daily. 1 po BID 20 capsule 0  . cetirizine (ZYRTEC) 10 MG tablet TAKE 1 TABLET EVERY DAY 90 tablet 1  . citalopram (CELEXA) 40 MG tablet TAKE 1 TABLET EVERY DAY 90 tablet 0  . HYDROcodone-acetaminophen (NORCO) 10-325 MG tablet Take 1-2 tablets by mouth every 6 (six) hours as needed. 240 tablet 0  . Ipratropium-Albuterol (COMBIVENT RESPIMAT) 20-100 MCG/ACT AERS respimat Inhale 1 puff into the lungs every 6 (six) hours as needed for wheezing. 3 Inhaler 3  . ipratropium-albuterol (DUONEB) 0.5-2.5 (3) MG/3ML SOLN Take 3 mLs by nebulization 4 (four) times daily as needed. (Patient taking differently: Take 3 mLs by nebulization 4 (four) times daily as needed (for wheezing/shortness of breath). ) 360 mL 11  . Macitentan (OPSUMIT) 10 MG TABS Take 1 tablet (10 mg total) by mouth daily. 90 tablet 3  . meloxicam (MOBIC) 15 MG tablet  TAKE 1 TABLET DAILY 90 tablet 0  . meloxicam (MOBIC) 7.5 MG tablet Take 7.5 mg by mouth at bedtime.    . OXYGEN 2.5 lpm with sleep and 4 lpm with exertion  [facility]    . potassium chloride SA (KLOR-CON M20) 20 MEQ tablet TAKE 1 TABLET (20 MEQ TOTAL) BY MOUTH 2 (TWO) TIMES DAILY. 180 tablet 3  . pravastatin (PRAVACHOL) 20 MG tablet Take 1 tablet (20 mg total) by mouth daily. 90 tablet 3  . Riociguat (ADEMPAS) 2 MG TABS Take 2 mg by mouth 3 (three) times daily. 90 tablet 11  . Selexipag 200 MCG TABS Take 2 tablets (400 mcg total) by mouth 2 (two) times daily. 120 tablet 6  . torsemide (DEMADEX) 20 MG tablet Take 3 tablets (60 mg total) by mouth every morning AND 2 tablets (40 mg total) every evening. Please cancel all previous orders for current medication. Change in dosage or pill size.. 350 tablet 3  . Vitamin D, Ergocalciferol, (DRISDOL) 50000 units CAPS capsule TAKE ONE CAPSULE BY MOUTH ONCE  WEEKLY FOR 30 DAYS 12 capsule 3  . zolpidem (AMBIEN) 5 MG tablet Take 1 tablet (5 mg total) by mouth at bedtime. 30 tablet 5   No current facility-administered medications for this encounter.    BP (!) 104/50   Pulse 73   Wt 225 lb (102.1 kg)   SpO2 91% Comment: on 3L of O2  BMI 35.24 kg/m   Wt Readings from Last 3 Encounters:  04/30/18 225 lb (102.1 kg)  02/20/18 233 lb 9.6 oz (106 kg)  01/29/18 232 lb 12.8 oz (105.6 kg)    General: NAD Neck: JVP 8-9 cm, no thyromegaly or thyroid nodule.  Lungs: Crackles at lung bases.  CV: Nondisplaced PMI.  Heart regular S1/S2, no S3/S4, no murmur.  Trace ankle edema.  No carotid bruit.  Normal pedal pulses.  Abdomen: Soft, nontender, no hepatosplenomegaly, no distention.  Skin: Intact without lesions or rashes.  Neurologic: Alert and oriented x 3.  Psych: Normal affect. Extremities: No clubbing or cyanosis.  HEENT: Normal.   Assessment/Plan:  1. Pulmonary arterial hypertension with RV failure: Severe on 5/16 RHC. Suspect mixed group 1 and group 3  PH.  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  There was concern from CTA chest for sarcoidosis, but lymph node biopsy was negative.  LFTs normal, ANA negative, anti-RNP negative, HIV negative.  V/Q scan negative for chronic PE.  Small PFO but no ASD on TEE.  No OSA on sleep study.  She did not tolerate Revatio due to joint pain (now resolved), and she did not tolerate Adcirca with worsening dyspnea.  She does not tolerate more than 200 mcg bid Selexipag due to pelvic pain.  Echo 6/17 showed mildly dilated RV with normal systolic function and PASP appeared lower at 39 mmHg. Echo in 6/18 showed mildly dilated RV with normal systolic function, but PASP appeared higher than prior at 65 mmHg.  However, repeat RHC in 7/18 showed primarily pulmonary venous hypertension with PVR only 1.6 WU.  She remains symptomatic, NYHA class III and wonders why she has not improved more.  - Given ongoing symptoms, I will arrange for a repeat RHC. We discussed risks/benefits and she agrees to the procedure.  - Repeat echo. - Defer 6 minute walk today due to flip-flops.  I asked her to wear tennis shoes to next appointment so she can do the walk.  - Open lung biopsy deemed too risky, probably not sarcoid per pulmonary evaluation.    - Continue opsumit and riociguat 2 mg twice a day (unable to go higher due to dizziness).   - She has tolerated selexipag 400 mcg bid but has not been able to increase.  2. Chronic hypoxemic respiratory failure: Chronic interstitial and obstructive lung disease, mediastinal and hilar lymphadenopathy.  Concern for sarcoidosis.  PFTs showed only mild restriction and obstruction.  Biopsy did not show sarcoid.  Deemed too high risk for open lung biopsy.  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 - O2 sats stable. Continue current regimen.  - She follows with Dr. [name] in pulmonary, needs repeat appt given ongoing significant symptoms => we will  try to schedule.  3. Chronic diastolic CHF: She looks volume overloaded on exam at least mildly.    - Increase torsemide to 60 mg bid x 3 days, then 60 qam/40 qpm. BMET today and again in 10 days.     Followup in 6 wks.  [name], MD  04/30/2018

## 2018-04-30 NOTE — Patient Instructions (Signed)
Increase Torsemide 60 mg (3 tabs), twice a day for 3 days   -Then 60 mg (3 tabs) in AM, then 40 mg (2 tabs) in PM  Your physician has requested that you have an echocardiogram. Echocardiography is a painless test that uses sound waves to create images of your heart. It provides your doctor with information about the size and shape of your heart and how well your heart's chambers and valves are working. This procedure takes approximately one hour. There are no restrictions for this procedure. (they will call you)   Your physician has requested that you have a cardiac catheterization. Cardiac catheterization is used to diagnose and/or treat various heart conditions. Doctors may recommend this procedure for a number of different reasons. The most common reason is to evaluate chest pain. Chest pain can be a symptom of coronary artery disease (CAD), and cardiac catheterization can show whether plaque is narrowing or blocking your heart's arteries. This procedure is also used to evaluate the valves, as well as measure the blood flow and oxygen levels in different parts of your heart. For further information please visit [email or web address] Please follow instruction sheet, as given.  Labs drawn today (if we do not call you, then your lab work was stable)   Your physician recommends that you return for lab work in: 10 days   Appt scheduled with Dr. [name]  Your physician recommends that you schedule a follow-up appointment in: 6 weeks

## 2018-04-30 NOTE — Progress Notes (Signed)
[facility] Informed Consent           Subject Name:  Kelly Donaldson    Subject met inclusion and exclusion criteria.  The informed consent form, study requirements and expectations were reviewed with the subject and questions and concerns were addressed prior to the signing of the consent form.  The subject verbalized understanding of the trial requirements.  The subject agreed to participate in the [facility] trial and signed the informed consent.  The informed consent was obtained prior to performance of any protocol-specific procedures for the subject.  A copy of the signed informed consent was given to the subject and a copy was placed in the subject's medical record.   Burundi Chalmers, Research Assistant 04/30/2018 10:51

## 2018-04-30 NOTE — H&P (View-Only) (Signed)
Patient ID: Kelly Donaldson, female   DOB: 10/31/1956, 62 y.o.   MRN: [id number]   PCP: Prudy Feeler Pulmonology: Dr [name] Primary HF: Dr Shirlee Latch   Kelly Donaldson is a 62 y.o. with history of chronic hypoxemic respiratory failure and pulmonary hypertension.  She has been followed by Dr [name], now follows with Dr. [name] with pulmonology.  Has been on home oxygen for several years now.  She quit smoking in 2016.  She had an echo done in 5/16 showing dilated RV with severe pulmonary hypertension.  She therefore had RHC in 5/16 which confirmed severe pulmonary hypertension and RV failure.  PFTs showed only mild obstruction and restriction with markedly decreased DLCO suggestive of pulmonary vascular disease.  There was concern for sarcoidosis on CT chest but lymph node biopsy showed no evidence for sarcoidosis.  V/Q scan showed no evidence for chronic PE.   She has not tolerated Revatio or Adcirca.  Have failed previous up-titration of Selexipag with pelvic pain, can tolerate 200 mcg bid.  She is now tolerating riociguat 2 mg tid.   Started torsemide in Oct. 2017, has had better diuresis.   Pt presents today for followup of pulmonary hypertension.  She does not feel like she has made much progress with her breathing.  She continues to do maintenance pulmonary rehab at [facility].  She is short of breath changing the bed linen, vacuuming, and with other moderate housework. She still cannot walk long distances without stopping to rest.  No orthopnea/PND. No chest pain.  No lightheadedness. She wore flip-flops again today so does not think she can do the 6 minute walk.  Weight is down 7 lbs.   6 minute walk (5/16): 238 meters => decreased oxygen saturation to 73%, increased to 6 L [place] 6 minute walk (7/16): 170 meters => unable to complete.  6 minute walk (9/16): 201 meters 6 minute walk (1/17): 256 meters 6 minute walk (4/17): 219 meters 6 minute walk (8/17): 225 meters 6 minute walk (11/17): 244 meters 6 minute  walk (5/18): 229 meters 6 minute walk (2/19): 122 meters (but she was stopped halfway through with drop in oxygen saturation.   Labs (2/16): BNP 69 Labs (5/16): LFTs normal, HCT 34.9, RF negative, HIV negative, TSH negative Labs (6/16): K 4.4, creatinine 0.88 Labs (7/16): ANA negative, BNP 58, K 4.3, creatinine 1.1, anti-RNP negative Labs (9/16): K 4.4, creatinine 1.26, BNP 91 Labs (10/16): K 4.3, creatinine 1.04 Labs (12/16): K 4.2, creatinine 1.07, BNP 130 Labs (1/17): K 4.3, creatinine 1.23, BNP 42 Labs (3/17): K 4.2, creatinine 1.19 Labs (5/17): K 4.3, creatinine 1.35, BNP 44 Labs (6/17): K 4.4, creatinine 1.18, BNP 44 Labs (10/17): K 4.2, creatinine 0.98, BNP 43 Labs (12/17): K 4.5, creatinine 1.23. BNP 64.5 Labs (2/18): K 4.5, creatinine 1.21, BNP 64 Labs (4/18): K 4.1, creatinine 1.24, BNP 26 Labs (9/18): K 4.6, creatinine 1.25 Labs (2/19): K 4.2, creatinine 1.39 . PMH: 1. Pulmonary hypertension: RHC (5/16) with mean RA 18, PA 83/33 mean 53, mean PCWP 19, CI 2.61, PVR 5.8 WU.   Echo (5/16) with EF 65-70%, septal flattening, dilated RV with PA systolic pressure 99 mmHg, +bubble study.  PFTs (4/16) with FVC 76%, FEV1 75%, ratio 97%, TLC 66%, DLCO 26% => mild restriction, mild obstruction, severe diffuse abnormality (pulmonary vascular problem).  CTA chest (4/16) with chronic interstitial and obstructive lung disease, small mediastinal and hilar lymph noes, no PE.  Lymph node biopsy negative for sarcoidosis (reactive changes).  V/Q  scan (6/16): No acute or chronic PE. TEE (6/16): Normal LV size and systolic function, EF 55-60%,RV was mildly dilated with mildly decreased systolic function, D-shaped interventricular septum suggestive of RV pressure/volume overload, there appeared to be a small PFO present with some bubbles crossing, no ASD.  ANA negative, anti-RNP negative, HIV negative. Sleep study (8/16) without OSA.  She did not tolerate Adcirca or Revatio.  She cannot tolerate more than  200 mcg bid Selexipag.  - Echo (6/17): EF 60-65%, normal diastolic function, D-shaped interventricular septum, mildly dilated RV with normal RV systolic function, PASP 39, IVC normal.  - Echo (6/18): EF 60-65%, normal diastolic function, D-shaped interventricular septum, mildly dilated RV with normal RV systolic function, PASP 65 mmHg.  - RHC (7/18): RA 13, PA 51/23 mean 36, PCWP 19, CI 4.74, PVR 1.6 WU 2. Chronic venous insufficiency. 3. Hyperlipidemia.  4. GERD 5. Chronic hypoxemic respiratory failure: Hypoxemia noted x 6 years at least.  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   SH: Married, quit smoking in 3/16.  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    FH: No pulmonary hypertension, no premature CAD, no sudden death.   Review of systems complete and found to be negative unless listed in HPI.    Current Outpatient Medications  Medication Sig Dispense Refill  . aspirin EC 81 MG tablet Take 1 tablet (81 mg total) by mouth daily. 90 tablet 3  . cefdinir (OMNICEF) 300 MG capsule Take 1 capsule (300 mg total) by mouth 2 (two) times daily. 1 po BID 20 capsule 0  . cetirizine (ZYRTEC) 10 MG tablet TAKE 1 TABLET EVERY DAY 90 tablet 1  . citalopram (CELEXA) 40 MG tablet TAKE 1 TABLET EVERY DAY 90 tablet 0  . HYDROcodone-acetaminophen (NORCO) 10-325 MG tablet Take 1-2 tablets by mouth every 6 (six) hours as needed. 240 tablet 0  . Ipratropium-Albuterol (COMBIVENT RESPIMAT) 20-100 MCG/ACT AERS respimat Inhale 1 puff into the lungs every 6 (six) hours as needed for wheezing. 3 Inhaler 3  . ipratropium-albuterol (DUONEB) 0.5-2.5 (3) MG/3ML SOLN Take 3 mLs by nebulization 4 (four) times daily as needed. (Patient taking differently: Take 3 mLs by nebulization 4 (four) times daily as needed (for wheezing/shortness of breath). ) 360 mL 11  . Macitentan (OPSUMIT) 10 MG TABS Take 1 tablet (10 mg total) by mouth daily. 90 tablet 3  . meloxicam (MOBIC) 15 MG tablet  TAKE 1 TABLET DAILY 90 tablet 0  . meloxicam (MOBIC) 7.5 MG tablet Take 7.5 mg by mouth at bedtime.    . OXYGEN 2.5 lpm with sleep and 4 lpm with exertion  [facility]    . potassium chloride SA (KLOR-CON M20) 20 MEQ tablet TAKE 1 TABLET (20 MEQ TOTAL) BY MOUTH 2 (TWO) TIMES DAILY. 180 tablet 3  . pravastatin (PRAVACHOL) 20 MG tablet Take 1 tablet (20 mg total) by mouth daily. 90 tablet 3  . Riociguat (ADEMPAS) 2 MG TABS Take 2 mg by mouth 3 (three) times daily. 90 tablet 11  . Selexipag 200 MCG TABS Take 2 tablets (400 mcg total) by mouth 2 (two) times daily. 120 tablet 6  . torsemide (DEMADEX) 20 MG tablet Take 3 tablets (60 mg total) by mouth every morning AND 2 tablets (40 mg total) every evening. Please cancel all previous orders for current medication. Change in dosage or pill size.. 350 tablet 3  . Vitamin D, Ergocalciferol, (DRISDOL) 50000 units CAPS capsule TAKE ONE CAPSULE BY MOUTH ONCE  WEEKLY FOR 30 DAYS 12 capsule 3  . zolpidem (AMBIEN) 5 MG tablet Take 1 tablet (5 mg total) by mouth at bedtime. 30 tablet 5   No current facility-administered medications for this encounter.    BP (!) 104/50   Pulse 73   Wt 225 lb (102.1 kg)   SpO2 91% Comment: on 3L of O2  BMI 35.24 kg/m   Wt Readings from Last 3 Encounters:  04/30/18 225 lb (102.1 kg)  02/20/18 233 lb 9.6 oz (106 kg)  01/29/18 232 lb 12.8 oz (105.6 kg)    General: NAD Neck: JVP 8-9 cm, no thyromegaly or thyroid nodule.  Lungs: Crackles at lung bases.  CV: Nondisplaced PMI.  Heart regular S1/S2, no S3/S4, no murmur.  Trace ankle edema.  No carotid bruit.  Normal pedal pulses.  Abdomen: Soft, nontender, no hepatosplenomegaly, no distention.  Skin: Intact without lesions or rashes.  Neurologic: Alert and oriented x 3.  Psych: Normal affect. Extremities: No clubbing or cyanosis.  HEENT: Normal.   Assessment/Plan:  1. Pulmonary arterial hypertension with RV failure: Severe on 5/16 RHC. Suspect mixed group 1 and group 3  PH.  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  There was concern from CTA chest for sarcoidosis, but lymph node biopsy was negative.  LFTs normal, ANA negative, anti-RNP negative, HIV negative.  V/Q scan negative for chronic PE.  Small PFO but no ASD on TEE.  No OSA on sleep study.  She did not tolerate Revatio due to joint pain (now resolved), and she did not tolerate Adcirca with worsening dyspnea.  She does not tolerate more than 200 mcg bid Selexipag due to pelvic pain.  Echo 6/17 showed mildly dilated RV with normal systolic function and PASP appeared lower at 39 mmHg. Echo in 6/18 showed mildly dilated RV with normal systolic function, but PASP appeared higher than prior at 65 mmHg.  However, repeat RHC in 7/18 showed primarily pulmonary venous hypertension with PVR only 1.6 WU.  She remains symptomatic, NYHA class III and wonders why she has not improved more.  - Given ongoing symptoms, I will arrange for a repeat RHC. We discussed risks/benefits and she agrees to the procedure.  - Repeat echo. - Defer 6 minute walk today due to flip-flops.  I asked her to wear tennis shoes to next appointment so she can do the walk.  - Open lung biopsy deemed too risky, probably not sarcoid per pulmonary evaluation.    - Continue opsumit and riociguat 2 mg twice a day (unable to go higher due to dizziness).   - She has tolerated selexipag 400 mcg bid but has not been able to increase.  2. Chronic hypoxemic respiratory failure: Chronic interstitial and obstructive lung disease, mediastinal and hilar lymphadenopathy.  Concern for sarcoidosis.  PFTs showed only mild restriction and obstruction.  Biopsy did not show sarcoid.  Deemed too high risk for open lung biopsy.  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 - O2 sats stable. Continue current regimen.  - She follows with Dr. McQuaid in pulmonary, needs repeat appt given ongoing significant symptoms => we will  try to schedule.  3. Chronic diastolic CHF: She looks volume overloaded on exam at least mildly.    - Increase torsemide to 60 mg bid x 3 days, then 60 qam/40 qpm. BMET today and again in 10 days.     Followup in 6 wks.  Pacen Watford, MD  04/30/2018   

## 2018-05-04 ENCOUNTER — Other Ambulatory Visit (HOSPITAL_COMMUNITY): Payer: Self-pay | Admitting: Cardiology

## 2018-05-06 ENCOUNTER — Encounter (HOSPITAL_COMMUNITY): Payer: Self-pay | Admitting: Cardiology

## 2018-05-06 ENCOUNTER — Encounter (HOSPITAL_COMMUNITY)
Admission: RE | Disposition: A | Discharge status: Home / Self Care | Payer: Self-pay | Source: Ambulatory Visit | Attending: Cardiology

## 2018-05-06 ENCOUNTER — Ambulatory Visit (HOSPITAL_COMMUNITY)
Admission: RE | Admit: 2018-05-06 | Discharge: 2018-05-06 | Disposition: A | Discharge status: Home / Self Care | Payer: Medicare PPO | Source: Ambulatory Visit | Attending: Cardiology | Admitting: Cardiology

## 2018-05-06 DIAGNOSIS — J9611 Chronic respiratory failure with hypoxia: Secondary | ICD-10-CM | POA: Insufficient documentation

## 2018-05-06 DIAGNOSIS — Z9981 Dependence on supplemental oxygen: Secondary | ICD-10-CM | POA: Diagnosis not present

## 2018-05-06 DIAGNOSIS — I5032 Chronic diastolic (congestive) heart failure: Secondary | ICD-10-CM | POA: Diagnosis not present

## 2018-05-06 DIAGNOSIS — Z9889 Other specified postprocedural states: Secondary | ICD-10-CM | POA: Diagnosis not present

## 2018-05-06 DIAGNOSIS — I2721 Secondary pulmonary arterial hypertension: Secondary | ICD-10-CM | POA: Diagnosis not present

## 2018-05-06 DIAGNOSIS — I272 Pulmonary hypertension, unspecified: Secondary | ICD-10-CM | POA: Insufficient documentation

## 2018-05-06 DIAGNOSIS — Z7982 Long term (current) use of aspirin: Secondary | ICD-10-CM | POA: Diagnosis not present

## 2018-05-06 DIAGNOSIS — K219 Gastro-esophageal reflux disease without esophagitis: Secondary | ICD-10-CM | POA: Insufficient documentation

## 2018-05-06 DIAGNOSIS — J449 Chronic obstructive pulmonary disease, unspecified: Secondary | ICD-10-CM | POA: Insufficient documentation

## 2018-05-06 DIAGNOSIS — I872 Venous insufficiency (chronic) (peripheral): Secondary | ICD-10-CM | POA: Diagnosis not present

## 2018-05-06 DIAGNOSIS — Z79899 Other long term (current) drug therapy: Secondary | ICD-10-CM | POA: Insufficient documentation

## 2018-05-06 DIAGNOSIS — Z87891 Personal history of nicotine dependence: Secondary | ICD-10-CM | POA: Diagnosis not present

## 2018-05-06 DIAGNOSIS — E785 Hyperlipidemia, unspecified: Secondary | ICD-10-CM | POA: Diagnosis not present

## 2018-05-06 DIAGNOSIS — I110 Hypertensive heart disease with heart failure: Secondary | ICD-10-CM | POA: Insufficient documentation

## 2018-05-06 HISTORY — PX: RIGHT HEART CATH: CATH118263

## 2018-05-06 LAB — POCT I-STAT 3, VENOUS BLOOD GAS (G3P V)
ACID-BASE EXCESS: 1 mmol/L (ref 0.0–2.0)
Acid-Base Excess: 3 mmol/L — ABNORMAL HIGH (ref 0.0–2.0)
Bicarbonate: 25.7 mmol/L (ref 20.0–28.0)
Bicarbonate: 27.5 mmol/L (ref 20.0–28.0)
O2 SAT: 74 %
O2 Saturation: 74 %
PH VEN: 7.423 (ref 7.250–7.430)
TCO2: 29 mmol/L (ref 22–32)
TCO2: 27 mmol/L (ref 22–32)
pCO2, Ven: 40.4 mmHg — ABNORMAL LOW (ref 44.0–60.0)
pCO2, Ven: 42.1 mmHg — ABNORMAL LOW (ref 44.0–60.0)
pH, Ven: 7.411 (ref 7.250–7.430)
pO2, Ven: 38 mmHg (ref 32.0–45.0)
pO2, Ven: 39 mmHg (ref 32.0–45.0)

## 2018-05-06 LAB — CBC
HCT: 29.1 % — ABNORMAL LOW (ref 36.0–46.0)
HEMOGLOBIN: 8.4 g/dL — AB (ref 12.0–15.0)
MCH: 20.9 pg — AB (ref 26.0–34.0)
MCHC: 28.9 g/dL — AB (ref 30.0–36.0)
MCV: 72.4 fL — ABNORMAL LOW (ref 78.0–100.0)
Platelets: 191 10*3/uL (ref 150–400)
RBC: 4.02 MIL/uL (ref 3.87–5.11)
RDW: 17.4 % — ABNORMAL HIGH (ref 11.5–15.5)
WBC: 7 10*3/uL (ref 4.0–10.5)

## 2018-05-06 SURGERY — RIGHT HEART CATH
Anesthesia: LOCAL

## 2018-05-06 MED ORDER — ASPIRIN 81 MG PO CHEW: 81.0000 mg | CHEWABLE_TABLET | ORAL | Status: DC

## 2018-05-06 MED ORDER — SODIUM CHLORIDE 0.9% FLUSH: 3.0000 mL | Freq: Two times a day (BID) | INTRAVENOUS | Status: DC

## 2018-05-06 MED ORDER — SODIUM CHLORIDE 0.9% FLUSH: 3.0000 mL | INTRAVENOUS | Status: DC | PRN

## 2018-05-06 MED ORDER — LIDOCAINE HCL (PF) 1 % IJ SOLN
INTRAMUSCULAR | Status: AC
  Filled 2018-05-06: qty 30

## 2018-05-06 MED ORDER — LIDOCAINE HCL (PF) 1 % IJ SOLN
INTRAMUSCULAR | Status: DC | PRN
  Administered 2018-05-06: 2 mL

## 2018-05-06 MED ORDER — HEPARIN (PORCINE) IN NACL 2-0.9 UNITS/ML
INTRAMUSCULAR | Status: AC | PRN
  Administered 2018-05-06: 500 mL

## 2018-05-06 MED ORDER — ONDANSETRON HCL 4 MG/2ML IJ SOLN: 4.0000 mg | Freq: Four times a day (QID) | INTRAMUSCULAR | Status: DC | PRN

## 2018-05-06 MED ORDER — SODIUM CHLORIDE 0.9 % IV SOLN: 250.0000 mL | INTRAVENOUS | Status: DC | PRN

## 2018-05-06 MED ORDER — SODIUM CHLORIDE 0.9 % IV SOLN
INTRAVENOUS | Status: DC
  Administered 2018-05-06: 10:00:00 via INTRAVENOUS

## 2018-05-06 MED ORDER — HEPARIN (PORCINE) IN NACL 1000-0.9 UT/500ML-% IV SOLN
INTRAVENOUS | Status: AC
  Filled 2018-05-06: qty 500

## 2018-05-06 MED ORDER — ACETAMINOPHEN 325 MG PO TABS: 650.0000 mg | ORAL_TABLET | ORAL | Status: DC | PRN

## 2018-05-06 SURGICAL SUPPLY — 7 items
CATH BALLN WEDGE 5F 110CM (CATHETERS) ×2 IMPLANT
GUIDEWIRE .025 260CM (WIRE) ×2 IMPLANT
PACK CARDIAC CATHETERIZATION (CUSTOM PROCEDURE TRAY) ×2 IMPLANT
SHEATH RAIN 4/5FR (SHEATH) ×2 IMPLANT
TRANSDUCER W/STOPCOCK (MISCELLANEOUS) ×2 IMPLANT
TUBING CIL FLEX 10 FLL-RA (TUBING) ×2 IMPLANT
WIRE ASAHI PROWATER 180CM (WIRE) ×2 IMPLANT

## 2018-05-06 NOTE — Discharge Instructions (Signed)
Right heart Venogram A venogram, or venography, is a procedure that uses an X-ray and dye (contrast) to examine how well the veins work and how blood flows through them. Contrast helps the veins show up on X-rays. A venogram may be done:  To evaluate vein abnormalities.  To identify clots within veins, such as deep vein thrombosis (DVT).  To map out the veins that might be needed for another procedure.  Tell a health care provider about:  Any allergies you have, especially to medicines, shellfish, iodine, and contrast.  All medicines you are taking, including vitamins, herbs, eye drops, creams, and over-the-counter medicines.  Any problems you or family members have had with anesthetic medicines.  Any blood disorders you have.  Any surgeries you have had and any complications that occurred.  Any medical conditions you have.  Whether you are pregnant, may be pregnant, or are breastfeeding.  Any history of smoking or tobacco use. What are the risks? Generally, this is a safe procedure. However, problems may occur, including:  Infection.  Bleeding.  Blood clots.  Allergic reaction to medicines or contrast.  Damage to other structures or organs.  Kidney problems.  X-ray exposure. Being exposed to too much radiation over a lifetime can increase the risk of cancer. The risk of this is small.  What happens before the procedure? Medicines Ask your health care provider about:  Changing or stopping your normal medicines. This is especially important if you take diabetes medicines or blood thinners.  Taking medicines such as aspirin and ibuprofen. These medicines can thin your blood. Do not take these medicines before your procedure if your doctor instructs you not to.  General instructions  You may have blood tests to check how well your kidneys and liver are working and how well your blood can clot.  Plan to have someone take you home from the hospital or  clinic.  Follow instructions from your health care provider about eating and drinking. What happens during the procedure?  An IV will be inserted into one of your veins.  You may be given a medicine to help you relax (sedative).  You will lie down on an X-ray table. The table may be tilted in different directions during the procedure to help the contrast move throughout your body. Safety straps will keep you secure if the table is tilted.  If veins in your arm or leg will be examined, a band may be wrapped around that arm or leg to keep the veins full of blood. This may cause your arm or leg to feel numb.  The contrast will be injected into your IV tube. You may notice a hot, flushed feeling as it moves throughout your body. You may also notice a metallic taste in your mouth. Both of these sensations will go away after the test is complete.  You may be asked to lie in different positions or place your legs or arms in different positions.  At the end of the procedure, you may be given IV fluids to help wash (flush) the contrast out of your veins.  The IV tube will be removed, and pressure will be applied to the IV site to prevent bleeding. A bandage (dressing) may be applied to the IV site. What happens after the procedure?  Your blood pressure, heart rate, breathing rate, and blood oxygen level will be monitored until the medicines you were given have worn off.  You may be given something to eat and drink.  You will  be instructed to drink a lot of fluids for the rest of the day and the next day. This helps to help flush the contrast out of your body.  If you were given a sedative, do not drive for 24 hours or until your health care provider approves. Summary  A venogram, or venography, is a procedure that uses an X-ray and dye (contrast) to examine how well the veins work and how blood flows through them. Contrast is a dye that helps the veins show up on X-rays.  An IV tube will be  inserted into one of your veins in order to inject the contrast.  During the exam, you will lie on an X-ray table. The table may be tilted in different directions during the procedure to help the contrast move throughout your body. Safety straps will keep you secure.  After the procedure, you will need to drink a lot of fluids to help wash (flush) the contrast out of your body. This information is not intended to replace advice given to you by your health care provider. Make sure you discuss any questions you have with your health care provider. Document Released: 11/13/2009 Document Revised: 10/19/2016 Document Reviewed: 10/19/2016 Elsevier Interactive Patient Education  2017 [employer].

## 2018-05-06 NOTE — Interval H&P Note (Signed)
History and Physical Interval Note:  05/06/2018 12:10 PM  Kelly Donaldson  has presented today for surgery, with the diagnosis of hp  The various methods of treatment have been discussed with the patient and family. After consideration of risks, benefits and other options for treatment, the patient has consented to  Procedure(s): RIGHT HEART CATH (N/A) as a surgical intervention .  The patient's history has been reviewed, patient examined, no change in status, stable for surgery.  I have reviewed the patient's chart and labs.  Questions were answered to the patient's satisfaction.     Joas Motton [employer]

## 2018-05-08 ENCOUNTER — Ambulatory Visit: Payer: Medicare PPO | Admitting: Adult Health

## 2018-05-11 ENCOUNTER — Ambulatory Visit: Payer: Medicare PPO | Admitting: Adult Health

## 2018-05-11 ENCOUNTER — Encounter: Payer: Self-pay | Admitting: Adult Health

## 2018-05-11 VITALS — BP 138/60 | HR 64 | Ht 67.0 in | Wt 227.6 lb

## 2018-05-11 DIAGNOSIS — I2721 Secondary pulmonary arterial hypertension: Secondary | ICD-10-CM

## 2018-05-11 DIAGNOSIS — I5032 Chronic diastolic (congestive) heart failure: Secondary | ICD-10-CM | POA: Diagnosis not present

## 2018-05-11 DIAGNOSIS — J9611 Chronic respiratory failure with hypoxia: Secondary | ICD-10-CM

## 2018-05-11 DIAGNOSIS — J432 Centrilobular emphysema: Secondary | ICD-10-CM | POA: Diagnosis not present

## 2018-05-11 DIAGNOSIS — J849 Interstitial pulmonary disease, unspecified: Secondary | ICD-10-CM

## 2018-05-11 MED ORDER — TIOTROPIUM BROMIDE MONOHYDRATE 2.5 MCG/ACT IN AERS: 2.0000 | INHALATION_SPRAY | Freq: Every day | RESPIRATORY_TRACT | 5 refills | Status: DC

## 2018-05-11 NOTE — Assessment & Plan Note (Signed)
Appears compensated on present regimen.  No evidence of overt overload on exam

## 2018-05-11 NOTE — Progress Notes (Signed)
@Patient  ID: Kelly Donaldson, female    DOB: 10-06-1956, 62 y.o.   MRN: [id number]  Chief Complaint  Patient presents with  . Follow-up    ILD     Referring provider: Caryl Never  HPI: 62 year old female former smoker followed for COPD ,  pulmonary hypertension and ILD with significant emphysema  TEST  Echo: Bubble echo 04/13/15 with R to L shunt   RHC: 04/16/15 Hemodynamics RA mean 18 RV 81/18 PA 83/33, mean 53 PCWP mean 19 Oxygen saturations: PA 63% AO 98% Cardiac Output (Fick) 5.82  Cardiac Index (Fick) 2.61 PVR 5.8 WU Rec:  Lasix/ masitentan   Imaging: CT chest images April 2016  showing a fibrotic process in the upper lobes extending into the periphery of the lower lobes, certainly worse in the upper lobes and the lower lobes are appears to be groundglass in the upper lobes. There is no pulmonary embolism  08/2016 high-resolution CT scan of the chest: [place]aid review: Significant upper lobe centrilobular emphysema .  Peripheral based interlobular septal thickening and groundglass consistent with fibrosis with some mild bronchiectasis, fibrotic changes appear to be more prominent in the lower lobes with upper lobe nodules, no air trapping noted on expiratory images, mediastinal lymphadenopathy stable per radiology  6 minute walk test 6 minute walk (7/16): 170 meters =>unable to complete.  6 minute walk (9/16): 201 meters 6 minute walk (1/17): 256 meters 6 minute walk (4/17): 219 meters 6 minute walk (8/17): 225 meters 6 minute walk (11/17): 244 meters 11/26/2016 walk 255 m on 4 L nasal cannula. O2 saturation dropped transiently to 86%. O2 saturation improved with 5 L nasal cannula to 95% while exerting herself.  05/11/2018 Follow up: Pulmonary HTN , COPD , ILD  Patient returns for follow-up.  She was last seen in 2017.  Patient has pulmonary hypertension followed by cardiology.  She is maintained on OPSUMIT and Adempas , Selexipag.  She has chronic  diastolic heart failure.  She is on Demadex 40 mg daily. She continued to have significant shortness of breath.  She underwent a right heart cath on May 06, 2018 that showed near normal filling pressures and mild pulmonary hypertension. This was improved from last RHC .  Patient says that she continues to get very short of breath with minimal activity.  She is going to pulmonary rehab.  She says that she tries to exercise and keep active.  She wears oxygen 2 L at rest.  4 to 5 L walking. Denies any increased flare of cough or wheezing.  Has any increased leg swelling.     Allergies  Allergen Reactions  . Gabapentin Other (See Comments)    Pt states that she can not take with antidepressants  . Latex Rash    Immunization History  Administered Date(s) Administered  . Influenza Split 09/08/2014, 09/20/2015, 09/08/2017  . Influenza,inj,Quad PF,6+ Mos 09/04/2016  . Influenza-Unspecified 10/13/2017    Past Medical History:  Diagnosis Date  . Allergy   . Depression   . GERD (gastroesophageal reflux disease)   . Hyperlipidemia   . Oxygen dependent   . Shortness of breath dyspnea   . Varicose veins     Tobacco History: Social History   Tobacco Use  Smoking Status Former Smoker  . Packs/day: 0.25  . Years: 17.00  . Pack years: 4.25  . Types: Cigarettes  . Last attempt to quit: 03/02/2015  . Years since quitting: 3.1  Smokeless Tobacco Never Used   Counseling given:  Not Answered   Outpatient Encounter Medications as of 05/11/2018  Medication Sig  . aspirin EC 81 MG tablet Take 1 tablet (81 mg total) by mouth daily.  . cetirizine (ZYRTEC) 10 MG tablet TAKE 1 TABLET EVERY DAY (Patient taking differently: TAKE 1 TABLET EVERY DAY AT BEDTIME.)  . citalopram (CELEXA) 40 MG tablet TAKE 1 TABLET EVERY DAY (Patient taking differently: Take 40 mg by mouth daily. TAKE 1 TABLET EVERY DAY)  . fluticasone (FLONASE) 50 MCG/ACT nasal spray Place 1 spray into both nostrils daily.  Marland Kitchen  HYDROcodone-acetaminophen (NORCO) 10-325 MG tablet Take 1-2 tablets by mouth every 6 (six) hours as needed. (Patient taking differently: Take 1-2 tablets by mouth every 6 (six) hours as needed (for pain.). )  . Ipratropium-Albuterol (COMBIVENT RESPIMAT) 20-100 MCG/ACT AERS respimat Inhale 1 puff into the lungs every 6 (six) hours as needed for wheezing.  Marland Kitchen ipratropium-albuterol (DUONEB) 0.5-2.5 (3) MG/3ML SOLN Take 3 mLs by nebulization 4 (four) times daily as needed. (Patient taking differently: Take 3 mLs by nebulization 4 (four) times daily as needed (for wheezing/shortness of breath). )  . Macitentan (OPSUMIT) 10 MG TABS Take 1 tablet (10 mg total) by mouth daily.  . meloxicam (MOBIC) 7.5 MG tablet Take 7.5 mg by mouth at bedtime.  . OXYGEN Take 2.5-4 L by mouth See admin instructions. 2.5 L with resting state & 4 L with exertion [facility]  . Polyethyl Glycol-Propyl Glycol (LUBRICANT EYE DROPS) 0.4-0.3 % SOLN Place 1 drop into both eyes daily.  . potassium chloride SA (KLOR-CON M20) 20 MEQ tablet TAKE 1 TABLET (20 MEQ TOTAL) BY MOUTH 2 (TWO) TIMES DAILY.  . pravastatin (PRAVACHOL) 20 MG tablet Take 1 tablet (20 mg total) by mouth daily. (Patient taking differently: Take 20 mg by mouth at bedtime. )  . Riociguat (ADEMPAS) 2 MG TABS Take 2 mg by mouth 3 (three) times daily.  . Selexipag 200 MCG TABS Take 2 tablets (400 mcg total) by mouth 2 (two) times daily. (Patient taking differently: Take 200 mcg by mouth 2 (two) times daily. )  . torsemide (DEMADEX) 20 MG tablet TAKE 2 TABLETS EVERY DAY  . Vitamin D, Ergocalciferol, (DRISDOL) 50000 units CAPS capsule TAKE ONE CAPSULE BY MOUTH ONCE WEEKLY FOR 30 DAYS (Patient taking differently: Take 50,000 Units by mouth every Monday. )  . zolpidem (AMBIEN) 5 MG tablet Take 1 tablet (5 mg total) by mouth at bedtime.  . Tiotropium Bromide Monohydrate (SPIRIVA RESPIMAT) 2.5 MCG/ACT AERS Inhale 2 puffs into the lungs daily.  . [DISCONTINUED] cefdinir (OMNICEF)  300 MG capsule Take 1 capsule (300 mg total) by mouth 2 (two) times daily. 1 po BID (Patient not taking: Reported on 05/11/2018)  . [DISCONTINUED] meloxicam (MOBIC) 15 MG tablet TAKE 1 TABLET DAILY (Patient not taking: Reported on 05/05/2018)   No facility-administered encounter medications on file as of 05/11/2018.      Review of Systems  Constitutional:   No  weight loss, night sweats,  Fevers, chills, + fatigue, or  lassitude.  HEENT:   No headaches,  Difficulty swallowing,  Tooth/dental problems, or  Sore throat,                No sneezing, itching, ear ache, nasal congestion, post nasal drip,   CV:  No chest pain,  Orthopnea, PND, swelling in lower extremities, anasarca, dizziness, palpitations, syncope.   GI  No heartburn, indigestion, abdominal pain, nausea, vomiting, diarrhea, change in bowel habits, loss of appetite, bloody stools.   Resp:  No chest wall deformity  Skin: no rash or lesions.  GU: no dysuria, change in color of urine, no urgency or frequency.  No flank pain, no hematuria   MS:  No joint pain or swelling.  No decreased range of motion.  No back pain.    Physical Exam  BP 138/60 (BP Location: Left Arm, Cuff Size: Normal)   Pulse 64   Ht 5\' 7"  (1.702 m)   Wt 227 lb 9.6 oz (103.2 kg)   SpO2 95%   BMI 35.65 kg/m   GEN: A/Ox3; pleasant , NAD, obese , on o2    HEENT:  [place]/AT,  EACs-clear, TMs-wnl, NOSE-clear, THROAT-clear, no lesions, no postnasal drip or exudate noted.   NECK:  Supple w/ fair ROM; no JVD; normal carotid impulses w/o bruits; no thyromegaly or nodules palpated; no lymphadenopathy.    RESP  Decreased BS in bases ,  no accessory muscle use, no dullness to percussion  CARD:  RRR, no m/r/g, tr  peripheral edema, pulses intact, no cyanosis or clubbing.  GI:   Soft & nt; nml bowel sounds; no organomegaly or masses detected.   Musco: Warm bil, no deformities or joint swelling noted.   Neuro: alert, no focal deficits noted.    Skin: Warm, no  lesions or rashes    Lab Results:  CBC  BMET   BNP  Imaging: No results found.   Assessment & Plan:   PAH (pulmonary artery hypertension) Pulmonary hypertension currently on therapy.  Most recent heart cath showed improved pulmonary artery pressures.  Patient is continue to follow with cardiology and continue on oxygen and diuretics   ILD (interstitial lung disease) (HCC) ILD changes on high-res CT chest.  Patient has progressive shortness of breath despite improved pulmonary hypertension pressures on most recent right heart cath. We will repeat HRCT chest along with full PFTs to see if this is worsening to explain for progressive dyspnea Patient is continue with pulmonary rehab.  Continue on oxygen to keep O2 saturation greater than 90%  Chronic respiratory failure with hypoxia (HCC) Continue on oxygen to keep O2 saturation greater than 90%  Chronic diastolic CHF (congestive heart failure) (HCC) Appears compensated on present regimen.  No evidence of overt overload on exam  Centrilobular emphysema (HCC) COPD with emphysema on CT chest.  Will repeat PFTs.  Trial of Spiriva to see if helps with shortness of breath  Plan  Patient Instructions  Begin Spiriva 2 puffs daily, rinse after use Continue on oxygen 2 L at rest, 4 L with walking Continue with pulmonary rehab Set up high-resolution CT chest to follow ILD Return in 4 to 6 weeks with Dr. with PFT and 6-minute walk test Please contact office for sooner follow up if symptoms do not improve or worsen or seek emergency care           [name], NP 05/11/2018

## 2018-05-11 NOTE — Assessment & Plan Note (Signed)
ILD changes on high-res CT chest.  Patient has progressive shortness of breath despite improved pulmonary hypertension pressures on most recent right heart cath. We will repeat HRCT chest along with full PFTs to see if this is worsening to explain for progressive dyspnea Patient is continue with pulmonary rehab.  Continue on oxygen to keep O2 saturation greater than 90%

## 2018-05-11 NOTE — Assessment & Plan Note (Signed)
Continue on oxygen to keep O2 saturation greater than 90%

## 2018-05-11 NOTE — Assessment & Plan Note (Signed)
COPD with emphysema on CT chest.  Will repeat PFTs.  Trial of Spiriva to see if helps with shortness of breath  Plan  Patient Instructions  Begin Spiriva 2 puffs daily, rinse after use Continue on oxygen 2 L at rest, 4 L with walking Continue with pulmonary rehab Set up high-resolution CT chest to follow ILD Return in 4 to 6 weeks with Dr. [name] with PFT and 6-minute walk test Please contact office for sooner follow up if symptoms do not improve or worsen or seek emergency care

## 2018-05-11 NOTE — Assessment & Plan Note (Signed)
Pulmonary hypertension currently on therapy.  Most recent heart cath showed improved pulmonary artery pressures.  Patient is continue to follow with cardiology and continue on oxygen and diuretics

## 2018-05-11 NOTE — Patient Instructions (Signed)
Begin Spiriva 2 puffs daily, rinse after use Continue on oxygen 2 L at rest, 4 L with walking Continue with pulmonary rehab Set up high-resolution CT chest to follow ILD Return in 4 to 6 weeks with Dr. [name] with PFT and 6-minute walk test Please contact office for sooner follow up if symptoms do not improve or worsen or seek emergency care

## 2018-05-13 NOTE — Progress Notes (Signed)
Done

## 2018-05-15 ENCOUNTER — Ambulatory Visit (HOSPITAL_COMMUNITY): Payer: Medicare PPO | Attending: Internal Medicine

## 2018-05-15 ENCOUNTER — Other Ambulatory Visit: Payer: Self-pay

## 2018-05-15 DIAGNOSIS — I071 Rheumatic tricuspid insufficiency: Secondary | ICD-10-CM | POA: Diagnosis not present

## 2018-05-15 DIAGNOSIS — Z87891 Personal history of nicotine dependence: Secondary | ICD-10-CM | POA: Insufficient documentation

## 2018-05-15 DIAGNOSIS — J439 Emphysema, unspecified: Secondary | ICD-10-CM | POA: Diagnosis not present

## 2018-05-15 DIAGNOSIS — I739 Peripheral vascular disease, unspecified: Secondary | ICD-10-CM | POA: Insufficient documentation

## 2018-05-15 DIAGNOSIS — I872 Venous insufficiency (chronic) (peripheral): Secondary | ICD-10-CM | POA: Diagnosis not present

## 2018-05-15 DIAGNOSIS — I5032 Chronic diastolic (congestive) heart failure: Secondary | ICD-10-CM | POA: Diagnosis not present

## 2018-05-15 DIAGNOSIS — D869 Sarcoidosis, unspecified: Secondary | ICD-10-CM | POA: Diagnosis not present

## 2018-05-15 DIAGNOSIS — I272 Pulmonary hypertension, unspecified: Secondary | ICD-10-CM | POA: Diagnosis not present

## 2018-05-15 DIAGNOSIS — E785 Hyperlipidemia, unspecified: Secondary | ICD-10-CM | POA: Insufficient documentation

## 2018-05-15 DIAGNOSIS — I509 Heart failure, unspecified: Secondary | ICD-10-CM | POA: Insufficient documentation

## 2018-05-17 DIAGNOSIS — J449 Chronic obstructive pulmonary disease, unspecified: Secondary | ICD-10-CM | POA: Diagnosis not present

## 2018-05-18 ENCOUNTER — Other Ambulatory Visit: Payer: Self-pay | Admitting: Physician Assistant

## 2018-05-18 ENCOUNTER — Ambulatory Visit (INDEPENDENT_AMBULATORY_CARE_PROVIDER_SITE_OTHER): Payer: Medicare PPO | Admitting: Physician Assistant

## 2018-05-18 ENCOUNTER — Encounter: Payer: Self-pay | Admitting: Physician Assistant

## 2018-05-18 VITALS — BP 90/54 | HR 76 | Temp 98.0°F | Ht 67.0 in | Wt 228.0 lb

## 2018-05-18 DIAGNOSIS — M5136 Other intervertebral disc degeneration, lumbar region: Secondary | ICD-10-CM | POA: Diagnosis not present

## 2018-05-18 DIAGNOSIS — J309 Allergic rhinitis, unspecified: Secondary | ICD-10-CM | POA: Diagnosis not present

## 2018-05-18 DIAGNOSIS — I2729 Other secondary pulmonary hypertension: Secondary | ICD-10-CM

## 2018-05-18 DIAGNOSIS — D869 Sarcoidosis, unspecified: Secondary | ICD-10-CM

## 2018-05-18 DIAGNOSIS — F330 Major depressive disorder, recurrent, mild: Secondary | ICD-10-CM | POA: Diagnosis not present

## 2018-05-18 DIAGNOSIS — I5032 Chronic diastolic (congestive) heart failure: Secondary | ICD-10-CM | POA: Diagnosis not present

## 2018-05-18 DIAGNOSIS — J40 Bronchitis, not specified as acute or chronic: Secondary | ICD-10-CM | POA: Diagnosis not present

## 2018-05-18 MED ORDER — HYDROCODONE-HOMATROPINE 5-1.5 MG/5ML PO SYRP: 5.0000 mL | ORAL_SOLUTION | Freq: Four times a day (QID) | ORAL | 0 refills | Status: DC | PRN

## 2018-05-18 MED ORDER — VITAMIN D (ERGOCALCIFEROL) 1.25 MG (50000 UNIT) PO CAPS: 50000.0000 [IU] | ORAL_CAPSULE | ORAL | 3 refills | Status: DC

## 2018-05-18 MED ORDER — FLUTICASONE PROPIONATE 50 MCG/ACT NA SUSP: 1.0000 | Freq: Every day | NASAL | 3 refills | Status: DC

## 2018-05-18 MED ORDER — MELOXICAM 7.5 MG PO TABS: 7.5000 mg | ORAL_TABLET | Freq: Every day | ORAL | 0 refills | Status: DC

## 2018-05-18 MED ORDER — HYDROCODONE-ACETAMINOPHEN 10-325 MG PO TABS: 1.0000 | ORAL_TABLET | Freq: Four times a day (QID) | ORAL | 0 refills | Status: DC | PRN

## 2018-05-18 MED ORDER — CITALOPRAM HYDROBROMIDE 40 MG PO TABS: ORAL_TABLET | ORAL | 0 refills | Status: DC

## 2018-05-18 MED ORDER — TORSEMIDE 20 MG PO TABS: 40.0000 mg | ORAL_TABLET | Freq: Every day | ORAL | 3 refills | Status: DC

## 2018-05-18 MED ORDER — PRAVASTATIN SODIUM 20 MG PO TABS: 20.0000 mg | ORAL_TABLET | Freq: Every day | ORAL | 3 refills | Status: DC

## 2018-05-18 MED ORDER — HYDROCODONE-ACETAMINOPHEN 10-325 MG PO TABS: 1.0000 | ORAL_TABLET | Freq: Four times a day (QID) | ORAL | 0 refills | Status: AC | PRN

## 2018-05-18 MED ORDER — CETIRIZINE HCL 10 MG PO TABS: 10.0000 mg | ORAL_TABLET | Freq: Every day | ORAL | 1 refills | Status: DC

## 2018-05-19 NOTE — Progress Notes (Signed)
BP (!) 90/54   Pulse 76   Temp 98 F (36.7 C) (Oral)   Ht 5\' 7"  (1.702 m)   Wt 228 lb (103.4 kg)   BMI 35.71 kg/m    Subjective:    Patient ID: Kelly Donaldson, female    DOB: May 24, 1956, 62 y.o.   MRN: [id number]  HPI: Kelly Donaldson is a 62 y.o. female presenting on 05/18/2018 for Hyperlipidemia and Hypertension  This patient comes in for periodic recheck on her high cholesterol, hypertension, chronic pain due to degenerative disc disease.  She does have chronic heart failure.  She does see pulmonology and cardiology.  She has a diagnosis of pulmonary hypertension.  She also has chronic depression.  She also has had some really bad allergies this spring and she is now dealing with a bronchitis infection.  Overall she is doing well except for the infection.  She has no complaints about her medications. Patient with several days of progressing upper respiratory and bronchial symptoms. Initially there was more upper respiratory congestion. This progressed to having significant cough that is productive throughout the day and severe at night. There is occasional wheezing after coughing. Sometimes there is slight dyspnea on exertion. It is productive mucus that is yellow in color. Denies any blood.   Past Medical History:  Diagnosis Date  . Allergy   . Depression   . GERD (gastroesophageal reflux disease)   . Hyperlipidemia   . Oxygen dependent   . Shortness of breath dyspnea   . Varicose veins    Relevant past medical, surgical, family and social history reviewed and updated as indicated. Interim medical history since our last visit reviewed. Allergies and medications reviewed and updated. DATA REVIEWED: CHART IN EPIC  Family History reviewed for pertinent findings.  Review of Systems  Constitutional: Positive for chills and fatigue. Negative for activity change, appetite change and fever.  HENT: Positive for congestion, postnasal drip and sore throat.   Eyes: Negative.     Respiratory: Positive for cough and wheezing. Negative for shortness of breath.   Cardiovascular: Negative.  Negative for chest pain, palpitations and leg swelling.  Gastrointestinal: Negative.   Genitourinary: Negative.   Musculoskeletal: Negative.   Skin: Negative.   Neurological: Positive for headaches.    Allergies as of 05/18/2018      Reactions   Gabapentin Other (See Comments)   Pt states that she can not take with antidepressants   Latex Rash      Medication List        Accurate as of 05/18/18 11:59 PM. Always use your most recent med list.          aspirin EC 81 MG tablet Take 1 tablet (81 mg total) by mouth daily.   cetirizine 10 MG tablet Commonly known as:  ZYRTEC Take 1 tablet (10 mg total) by mouth daily.   citalopram 40 MG tablet Commonly known as:  CELEXA TAKE 1 TABLET EVERY DAY   fluticasone 50 MCG/ACT nasal spray Commonly known as:  FLONASE Place 1 spray into both nostrils daily.   HYDROcodone-acetaminophen 10-325 MG tablet Commonly known as:  NORCO Take 1-2 tablets by mouth every 6 (six) hours as needed.   HYDROcodone-acetaminophen 10-325 MG tablet Commonly known as:  NORCO Take 1-2 tablets by mouth every 6 (six) hours as needed.   HYDROcodone-acetaminophen 10-325 MG tablet Commonly known as:  NORCO Take 1-2 tablets by mouth every 6 (six) hours as needed.   HYDROcodone-homatropine 5-1.5 MG/5ML syrup Commonly  known as:  HYCODAN Take 5-10 mLs by mouth every 6 (six) hours as needed.   ipratropium-albuterol 0.5-2.5 (3) MG/3ML Soln Commonly known as:  DUONEB Take 3 mLs by nebulization 4 (four) times daily as needed.   Ipratropium-Albuterol 20-100 MCG/ACT Aers respimat Commonly known as:  COMBIVENT RESPIMAT Inhale 1 puff into the lungs every 6 (six) hours as needed for wheezing.   LUBRICANT EYE DROPS 0.4-0.3 % Soln Generic drug:  Polyethyl Glycol-Propyl Glycol Place 1 drop into both eyes daily.   macitentan 10 MG tablet Commonly known  as:  OPSUMIT Take 1 tablet (10 mg total) by mouth daily.   meloxicam 7.5 MG tablet Commonly known as:  MOBIC Take 1 tablet (7.5 mg total) by mouth at bedtime.   OXYGEN Take 2.5-4 L by mouth See admin instructions. 2.5 L with resting state & 4 L with exertion [facility]   potassium chloride SA 20 MEQ tablet Commonly known as:  KLOR-CON M20 TAKE 1 TABLET (20 MEQ TOTAL) BY MOUTH 2 (TWO) TIMES DAILY.   pravastatin 20 MG tablet Commonly known as:  PRAVACHOL Take 1 tablet (20 mg total) by mouth daily.   Riociguat 2 MG Tabs Commonly known as:  ADEMPAS Take 2 mg by mouth 3 (three) times daily.   Selexipag 200 MCG Tabs Take 2 tablets (400 mcg total) by mouth 2 (two) times daily.   Tiotropium Bromide Monohydrate 2.5 MCG/ACT Aers Commonly known as:  SPIRIVA RESPIMAT Inhale 2 puffs into the lungs daily.   torsemide 20 MG tablet Commonly known as:  DEMADEX Take 2 tablets (40 mg total) by mouth daily.   Vitamin D (Ergocalciferol) 50000 units Caps capsule Commonly known as:  DRISDOL Take 1 capsule (50,000 Units total) by mouth every Monday.   zolpidem 5 MG tablet Commonly known as:  AMBIEN Take 1 tablet (5 mg total) by mouth at bedtime.          Objective:    BP (!) 90/54   Pulse 76   Temp 98 F (36.7 C) (Oral)   Ht 5\' 7"  (1.702 m)   Wt 228 lb (103.4 kg)   BMI 35.71 kg/m   Allergies  Allergen Reactions  . Gabapentin Other (See Comments)    Pt states that she can not take with antidepressants  . Latex Rash    Wt Readings from Last 3 Encounters:  05/18/18 228 lb (103.4 kg)  05/11/18 227 lb 9.6 oz (103.2 kg)  05/06/18 226 lb (102.5 kg)    Physical Exam  Constitutional: She is oriented to person, place, and time. She appears well-developed and well-nourished.  HENT:  Head: Normocephalic and atraumatic.  Right Ear: There is drainage and tenderness.  Left Ear: There is drainage and tenderness.  Nose: Mucosal edema and rhinorrhea present. Right sinus exhibits no  maxillary sinus tenderness and no frontal sinus tenderness. Left sinus exhibits no maxillary sinus tenderness and no frontal sinus tenderness.  Mouth/Throat: Oropharyngeal exudate and posterior oropharyngeal erythema present.  Eyes: Pupils are equal, round, and reactive to light. Conjunctivae and EOM are normal.  Neck: Normal range of motion. Neck supple.  Cardiovascular: Normal rate, regular rhythm, normal heart sounds and intact distal pulses.  Pulmonary/Chest: Effort normal. No respiratory distress. She has wheezes in the right upper field and the left upper field. She exhibits no tenderness.  Abdominal: Soft. Bowel sounds are normal.  Neurological: She is alert and oriented to person, place, and time. She has normal reflexes.  Skin: Skin is warm and dry. No rash  noted.  Psychiatric: She has a normal mood and affect. Her behavior is normal. Judgment and thought content normal.    Results for orders placed or performed during the hospital encounter of 05/06/18  CBC  Result Value Ref Range   WBC 7.0 4.0 - 10.5 K/uL   RBC 4.02 3.87 - 5.11 MIL/uL   Hemoglobin 8.4 (L) 12.0 - 15.0 g/dL   HCT 02.7 (L) 25.3 - 66.4 %   MCV 72.4 (L) 78.0 - 100.0 fL   MCH 20.9 (L) 26.0 - 34.0 pg   MCHC 28.9 (L) 30.0 - 36.0 g/dL   RDW 40.3 (H) 47.4 - 25.9 %   Platelets 191 150 - 400 K/uL  I-STAT 3, venous blood gas (G3P V)  Result Value Ref Range   pH, Ven 7.423 7.250 - 7.430   pCO2, Ven 42.1 (L) 44.0 - 60.0 mmHg   pO2, Ven 38.0 32.0 - 45.0 mmHg   Bicarbonate 27.5 20.0 - 28.0 mmol/L   TCO2 29 22 - 32 mmol/L   O2 Saturation 74.0 %   Acid-Base Excess 3.0 (H) 0.0 - 2.0 mmol/L   Patient temperature HIDE    Sample type VENOUS   I-STAT 3, venous blood gas (G3P V)  Result Value Ref Range   pH, Ven 7.411 7.250 - 7.430   pCO2, Ven 40.4 (L) 44.0 - 60.0 mmHg   pO2, Ven 39.0 32.0 - 45.0 mmHg   Bicarbonate 25.7 20.0 - 28.0 mmol/L   TCO2 27 22 - 32 mmol/L   O2 Saturation 74.0 %   Acid-Base Excess 1.0 0.0 - 2.0  mmol/L   Patient temperature HIDE    Sample type VENOUS       Assessment & Plan:   1. DDD (degenerative disc disease), lumbar - meloxicam (MOBIC) 7.5 MG tablet; Take 1 tablet (7.5 mg total) by mouth at bedtime.  Dispense: 90 tablet; Refill: 0 - HYDROcodone-acetaminophen (NORCO) 10-325 MG tablet; Take 1-2 tablets by mouth every 6 (six) hours as needed.  Dispense: 240 tablet; Refill: 0 - HYDROcodone-acetaminophen (NORCO) 10-325 MG tablet; Take 1-2 tablets by mouth every 6 (six) hours as needed.  Dispense: 240 tablet; Refill: 0 - HYDROcodone-acetaminophen (NORCO) 10-325 MG tablet; Take 1-2 tablets by mouth every 6 (six) hours as needed.  Dispense: 240 tablet; Refill: 0  2. Chronic diastolic CHF (congestive heart failure) (HCC) - pravastatin (PRAVACHOL) 20 MG tablet; Take 1 tablet (20 mg total) by mouth daily.  Dispense: 90 tablet; Refill: 3  3. Bronchitis - HYDROcodone-homatropine (HYCODAN) 5-1.5 MG/5ML syrup; Take 5-10 mLs by mouth every 6 (six) hours as needed.  Dispense: 240 mL; Refill: 0  4. Pulmonary hypertension associated with sarcoidosis (HCC) - torsemide (DEMADEX) 20 MG tablet; Take 2 tablets (40 mg total) by mouth daily.  Dispense: 180 tablet; Refill: 3  5. Mild episode of recurrent major depressive disorder (HCC) - citalopram (CELEXA) 40 MG tablet; TAKE 1 TABLET EVERY DAY  Dispense: 90 tablet; Refill: 0  6. Allergic rhinitis, unspecified seasonality, unspecified trigger - cetirizine (ZYRTEC) 10 MG tablet; Take 1 tablet (10 mg total) by mouth daily.  Dispense: 90 tablet; Refill: 1 - fluticasone (FLONASE) 50 MCG/ACT nasal spray; Place 1 spray into both nostrils daily.  Dispense: 48 g; Refill: 3   Continue all other maintenance medications as listed above.  Follow up plan: Return in about 3 months (around 08/18/2018) for recheck.  Educational handout given for survey  Remus Loffler PA-C [facility] [address]  [place], [place]  [address] [phone]   05/19/2018, 9:57 AM

## 2018-05-25 ENCOUNTER — Other Ambulatory Visit: Payer: Self-pay | Admitting: Physician Assistant

## 2018-05-25 DIAGNOSIS — J40 Bronchitis, not specified as acute or chronic: Secondary | ICD-10-CM

## 2018-05-27 DIAGNOSIS — Z87891 Personal history of nicotine dependence: Secondary | ICD-10-CM | POA: Diagnosis not present

## 2018-05-27 DIAGNOSIS — I272 Pulmonary hypertension, unspecified: Secondary | ICD-10-CM | POA: Diagnosis not present

## 2018-05-27 DIAGNOSIS — I5032 Chronic diastolic (congestive) heart failure: Secondary | ICD-10-CM | POA: Diagnosis not present

## 2018-05-27 DIAGNOSIS — N182 Chronic kidney disease, stage 2 (mild): Secondary | ICD-10-CM | POA: Diagnosis not present

## 2018-05-27 DIAGNOSIS — J9611 Chronic respiratory failure with hypoxia: Secondary | ICD-10-CM | POA: Diagnosis not present

## 2018-05-27 DIAGNOSIS — J189 Pneumonia, unspecified organism: Secondary | ICD-10-CM | POA: Diagnosis not present

## 2018-05-27 DIAGNOSIS — Z9981 Dependence on supplemental oxygen: Secondary | ICD-10-CM | POA: Diagnosis not present

## 2018-05-27 DIAGNOSIS — R0902 Hypoxemia: Secondary | ICD-10-CM | POA: Diagnosis not present

## 2018-05-27 DIAGNOSIS — F329 Major depressive disorder, single episode, unspecified: Secondary | ICD-10-CM | POA: Diagnosis not present

## 2018-05-27 DIAGNOSIS — I130 Hypertensive heart and chronic kidney disease with heart failure and stage 1 through stage 4 chronic kidney disease, or unspecified chronic kidney disease: Secondary | ICD-10-CM | POA: Diagnosis not present

## 2018-05-27 DIAGNOSIS — J441 Chronic obstructive pulmonary disease with (acute) exacerbation: Secondary | ICD-10-CM | POA: Diagnosis not present

## 2018-05-27 DIAGNOSIS — R0602 Shortness of breath: Secondary | ICD-10-CM | POA: Diagnosis not present

## 2018-05-28 ENCOUNTER — Telehealth: Payer: Self-pay | Admitting: Pulmonary Disease

## 2018-05-28 ENCOUNTER — Ambulatory Visit (HOSPITAL_COMMUNITY): Payer: Medicare PPO

## 2018-05-28 DIAGNOSIS — F329 Major depressive disorder, single episode, unspecified: Secondary | ICD-10-CM | POA: Diagnosis not present

## 2018-05-28 DIAGNOSIS — J189 Pneumonia, unspecified organism: Secondary | ICD-10-CM | POA: Diagnosis not present

## 2018-05-28 DIAGNOSIS — Z9981 Dependence on supplemental oxygen: Secondary | ICD-10-CM | POA: Diagnosis not present

## 2018-05-28 DIAGNOSIS — I272 Pulmonary hypertension, unspecified: Secondary | ICD-10-CM | POA: Diagnosis not present

## 2018-05-28 DIAGNOSIS — J441 Chronic obstructive pulmonary disease with (acute) exacerbation: Secondary | ICD-10-CM | POA: Diagnosis not present

## 2018-05-28 DIAGNOSIS — I130 Hypertensive heart and chronic kidney disease with heart failure and stage 1 through stage 4 chronic kidney disease, or unspecified chronic kidney disease: Secondary | ICD-10-CM | POA: Diagnosis not present

## 2018-05-28 DIAGNOSIS — J9611 Chronic respiratory failure with hypoxia: Secondary | ICD-10-CM | POA: Diagnosis not present

## 2018-05-28 DIAGNOSIS — N182 Chronic kidney disease, stage 2 (mild): Secondary | ICD-10-CM | POA: Diagnosis not present

## 2018-05-28 DIAGNOSIS — Z87891 Personal history of nicotine dependence: Secondary | ICD-10-CM | POA: Diagnosis not present

## 2018-05-28 DIAGNOSIS — I5032 Chronic diastolic (congestive) heart failure: Secondary | ICD-10-CM | POA: Diagnosis not present

## 2018-05-28 NOTE — Telephone Encounter (Signed)
Attempted to contact Kelly Donaldson at [facility]. I did not receive an answer. There was no option for me to leave a message. Will try back.

## 2018-05-29 ENCOUNTER — Ambulatory Visit: Payer: Medicare PPO | Admitting: Physician Assistant

## 2018-05-29 DIAGNOSIS — N182 Chronic kidney disease, stage 2 (mild): Secondary | ICD-10-CM | POA: Diagnosis not present

## 2018-05-29 DIAGNOSIS — Z9981 Dependence on supplemental oxygen: Secondary | ICD-10-CM | POA: Diagnosis not present

## 2018-05-29 DIAGNOSIS — J441 Chronic obstructive pulmonary disease with (acute) exacerbation: Secondary | ICD-10-CM | POA: Diagnosis not present

## 2018-05-29 DIAGNOSIS — I130 Hypertensive heart and chronic kidney disease with heart failure and stage 1 through stage 4 chronic kidney disease, or unspecified chronic kidney disease: Secondary | ICD-10-CM | POA: Diagnosis not present

## 2018-05-29 DIAGNOSIS — Z87891 Personal history of nicotine dependence: Secondary | ICD-10-CM | POA: Diagnosis not present

## 2018-05-29 DIAGNOSIS — F329 Major depressive disorder, single episode, unspecified: Secondary | ICD-10-CM | POA: Diagnosis not present

## 2018-05-29 DIAGNOSIS — I272 Pulmonary hypertension, unspecified: Secondary | ICD-10-CM | POA: Diagnosis not present

## 2018-05-29 DIAGNOSIS — J9611 Chronic respiratory failure with hypoxia: Secondary | ICD-10-CM | POA: Diagnosis not present

## 2018-05-29 DIAGNOSIS — I5032 Chronic diastolic (congestive) heart failure: Secondary | ICD-10-CM | POA: Diagnosis not present

## 2018-05-29 NOTE — Telephone Encounter (Signed)
Called [facility] and spoke with Shanda Bumps stating to her if they want to do the CT while pt is currently admitted, that is fine.  Stated to her pt was supposed to have had a HRCT today without contrast to follow up on ILD.  Shanda Bumps stated she will relay this to the doctor to see if he is fine scheduling it.  If he is fine, they will do the scan while pt is admitted.

## 2018-06-02 ENCOUNTER — Ambulatory Visit (INDEPENDENT_AMBULATORY_CARE_PROVIDER_SITE_OTHER): Payer: Medicare PPO | Admitting: Physician Assistant

## 2018-06-02 ENCOUNTER — Encounter: Payer: Self-pay | Admitting: Physician Assistant

## 2018-06-02 VITALS — BP 97/57 | HR 89 | Temp 97.8°F | Ht 67.0 in | Wt 216.6 lb

## 2018-06-02 DIAGNOSIS — J9611 Chronic respiratory failure with hypoxia: Secondary | ICD-10-CM | POA: Diagnosis not present

## 2018-06-02 DIAGNOSIS — J40 Bronchitis, not specified as acute or chronic: Secondary | ICD-10-CM

## 2018-06-02 MED ORDER — CEFDINIR 300 MG PO CAPS: 300.0000 mg | ORAL_CAPSULE | Freq: Two times a day (BID) | ORAL | 0 refills | Status: DC

## 2018-06-03 NOTE — Progress Notes (Signed)
BP (!) 97/57   Pulse 89   Temp 97.8 F (36.6 C) (Oral)   Ht 5\' 7"  (1.702 m)   Wt 216 lb 9.6 oz (98.2 kg)   BMI 33.92 kg/m    Subjective:    Patient ID: Kelly Donaldson, female    DOB: August 19, 1956, 62 y.o.   MRN: [id number]  HPI: Kelly Donaldson is a 62 y.o. female presenting on 06/02/2018 for Hospitalization Follow-up [facility] )  This patient comes in for recheck after being hospitalized for COPD exacerbation.  She reports that she was admitted to [facility] in [place].  I have reviewed all the notes sent to [place].  She was given medications to get her bronchitis come down.  They felt that she might of even had pneumonia.  She reports that she is doing better but is not completely healed.  She is taking her medications appropriately.  She is feeling nauseous from the doxycycline.  Were going to discontinue that and start Omnicef.  She has taken that in the past without difficulty.  She will be following up next month with pulmonology and cardiology.  Past Medical History:  Diagnosis Date  . Allergy   . Depression   . GERD (gastroesophageal reflux disease)   . Hyperlipidemia   . Oxygen dependent   . Shortness of breath dyspnea   . Varicose veins    Relevant past medical, surgical, family and social history reviewed and updated as indicated. Interim medical history since our last visit reviewed. Allergies and medications reviewed and updated. DATA REVIEWED: CHART IN EPIC  Family History reviewed for pertinent findings.  Review of Systems  Constitutional: Negative.   HENT: Negative.   Eyes: Negative.   Respiratory: Positive for cough and shortness of breath. Negative for wheezing.   Cardiovascular: Negative.  Negative for chest pain, palpitations and leg swelling.  Gastrointestinal: Negative.   Genitourinary: Negative.   Psychiatric/Behavioral: Negative for decreased concentration, sleep disturbance and suicidal ideas. The patient is not nervous/anxious.      Allergies as of 06/02/2018      Reactions   Gabapentin Other (See Comments)   Pt states that she can not take with antidepressants   Latex Rash      Medication List        Accurate as of 06/02/18 11:59 PM. Always use your most recent med list.          aspirin EC 81 MG tablet Take 1 tablet (81 mg total) by mouth daily.   cefdinir 300 MG capsule Commonly known as:  OMNICEF Take 1 capsule (300 mg total) by mouth 2 (two) times daily. 1 po BID   citalopram 40 MG tablet Commonly known as:  CELEXA TAKE 1 TABLET EVERY DAY   fluticasone 50 MCG/ACT nasal spray Commonly known as:  FLONASE Place 1 spray into both nostrils daily.   HYDROcodone-acetaminophen 10-325 MG tablet Commonly known as:  NORCO Take 1-2 tablets by mouth every 6 (six) hours as needed.   HYDROcodone-acetaminophen 10-325 MG tablet Commonly known as:  NORCO Take 1-2 tablets by mouth every 6 (six) hours as needed.   HYDROcodone-acetaminophen 10-325 MG tablet Commonly known as:  NORCO Take 1-2 tablets by mouth every 6 (six) hours as needed.   HYDROcodone-homatropine 5-1.5 MG/5ML syrup Commonly known as:  HYCODAN Take 5-10 mLs by mouth every 6 (six) hours as needed.   ipratropium-albuterol 0.5-2.5 (3) MG/3ML Soln Commonly known as:  DUONEB Take 3 mLs by nebulization  4 (four) times daily as needed.   Ipratropium-Albuterol 20-100 MCG/ACT Aers respimat Commonly known as:  COMBIVENT RESPIMAT Inhale 1 puff into the lungs every 6 (six) hours as needed for wheezing.   LUBRICANT EYE DROPS 0.4-0.3 % Soln Generic drug:  Polyethyl Glycol-Propyl Glycol Place 1 drop into both eyes daily.   macitentan 10 MG tablet Commonly known as:  OPSUMIT Take 1 tablet (10 mg total) by mouth daily.   OXYGEN Take 2.5-4 L by mouth See admin instructions. 2.5 L with resting state & 4 L with exertion [facility]   potassium chloride SA 20 MEQ tablet Commonly known as:  KLOR-CON M20 TAKE 1 TABLET (20 MEQ TOTAL) BY MOUTH 2  (TWO) TIMES DAILY.   pravastatin 20 MG tablet Commonly known as:  PRAVACHOL Take 1 tablet (20 mg total) by mouth daily.   predniSONE 10 MG tablet Commonly known as:  DELTASONE   Riociguat 2 MG Tabs Commonly known as:  ADEMPAS Take 2 mg by mouth 3 (three) times daily.   Selexipag 200 MCG Tabs Take 2 tablets (400 mcg total) by mouth 2 (two) times daily.   Tiotropium Bromide Monohydrate 2.5 MCG/ACT Aers Commonly known as:  SPIRIVA RESPIMAT Inhale 2 puffs into the lungs daily.   torsemide 20 MG tablet Commonly known as:  DEMADEX Take 2 tablets (40 mg total) by mouth daily.   Vitamin D (Ergocalciferol) 50000 units Caps capsule Commonly known as:  DRISDOL Take 1 capsule (50,000 Units total) by mouth every Monday.   zolpidem 5 MG tablet Commonly known as:  AMBIEN Take 1 tablet (5 mg total) by mouth at bedtime.          Objective:    BP (!) 97/57   Pulse 89   Temp 97.8 F (36.6 C) (Oral)   Ht 5\' 7"  (1.702 m)   Wt 216 lb 9.6 oz (98.2 kg)   BMI 33.92 kg/m   Allergies  Allergen Reactions  . Gabapentin Other (See Comments)    Pt states that she can not take with antidepressants  . Latex Rash    Wt Readings from Last 3 Encounters:  06/02/18 216 lb 9.6 oz (98.2 kg)  05/18/18 228 lb (103.4 kg)  05/11/18 227 lb 9.6 oz (103.2 kg)    Physical Exam  Constitutional: She is oriented to person, place, and time. She appears well-developed and well-nourished.  HENT:  Head: Normocephalic and atraumatic.  Eyes: Pupils are equal, round, and reactive to light. Conjunctivae and EOM are normal.  Cardiovascular: Normal rate, regular rhythm, normal heart sounds and intact distal pulses.  Pulmonary/Chest: Effort normal and breath sounds normal.  Abdominal: Soft. Bowel sounds are normal.  Neurological: She is alert and oriented to person, place, and time. She has normal reflexes.  Skin: Skin is warm and dry. No rash noted.  Psychiatric: She has a normal mood and affect. Her behavior  is normal. Judgment and thought content normal.    Results for orders placed or performed during the hospital encounter of 05/06/18  CBC  Result Value Ref Range   WBC 7.0 4.0 - 10.5 K/uL   RBC 4.02 3.87 - 5.11 MIL/uL   Hemoglobin 8.4 (L) 12.0 - 15.0 g/dL   HCT 05/08/18 (L) 52.7 - 78.2 %   MCV 72.4 (L) 78.0 - 100.0 fL   MCH 20.9 (L) 26.0 - 34.0 pg   MCHC 28.9 (L) 30.0 - 36.0 g/dL   RDW 42.3 (H) 53.6 - 14.4 %   Platelets 191 150 - 400  K/uL  I-STAT 3, venous blood gas (G3P V)  Result Value Ref Range   pH, Ven 7.423 7.250 - 7.430   pCO2, Ven 42.1 (L) 44.0 - 60.0 mmHg   pO2, Ven 38.0 32.0 - 45.0 mmHg   Bicarbonate 27.5 20.0 - 28.0 mmol/L   TCO2 29 22 - 32 mmol/L   O2 Saturation 74.0 %   Acid-Base Excess 3.0 (H) 0.0 - 2.0 mmol/L   Patient temperature HIDE    Sample type VENOUS   I-STAT 3, venous blood gas (G3P V)  Result Value Ref Range   pH, Ven 7.411 7.250 - 7.430   pCO2, Ven 40.4 (L) 44.0 - 60.0 mmHg   pO2, Ven 39.0 32.0 - 45.0 mmHg   Bicarbonate 25.7 20.0 - 28.0 mmol/L   TCO2 27 22 - 32 mmol/L   O2 Saturation 74.0 %   Acid-Base Excess 1.0 0.0 - 2.0 mmol/L   Patient temperature HIDE    Sample type VENOUS       Assessment & Plan:   1. Chronic respiratory failure with hypoxia [facility]) Follow with pulmonology and cardiaology  2. Bronchitis - predniSONE (DELTASONE) 10 MG tablet - cefdinir (OMNICEF) 300 MG capsule; Take 1 capsule (300 mg total) by mouth 2 (two) times daily. 1 po BID  Dispense: 20 capsule; Refill: 0   Continue all other maintenance medications as listed above.  Follow up plan: No follow-ups on file.  Educational handout given for survey  Remus Loffler PA-C [facility] [address]  [place], [place] [address] [phone]   06/03/2018, 8:41 AM I have reviewed all of the notes sent to [place].

## 2018-06-16 DIAGNOSIS — J449 Chronic obstructive pulmonary disease, unspecified: Secondary | ICD-10-CM | POA: Diagnosis not present

## 2018-06-18 ENCOUNTER — Encounter: Payer: Self-pay | Admitting: Pulmonary Disease

## 2018-06-18 ENCOUNTER — Encounter (HOSPITAL_COMMUNITY): Payer: Self-pay | Admitting: Cardiology

## 2018-06-18 ENCOUNTER — Ambulatory Visit (HOSPITAL_COMMUNITY)
Admission: RE | Admit: 2018-06-18 | Discharge: 2018-06-18 | Disposition: A | Discharge status: Home / Self Care | Payer: Medicare PPO | Source: Ambulatory Visit | Attending: Cardiology | Admitting: Cardiology

## 2018-06-18 ENCOUNTER — Other Ambulatory Visit: Payer: Self-pay

## 2018-06-18 ENCOUNTER — Ambulatory Visit (INDEPENDENT_AMBULATORY_CARE_PROVIDER_SITE_OTHER): Payer: Medicare PPO | Admitting: Pulmonary Disease

## 2018-06-18 ENCOUNTER — Ambulatory Visit: Payer: Medicare PPO | Admitting: Pulmonary Disease

## 2018-06-18 VITALS — BP 132/60 | HR 72 | Ht 67.0 in | Wt 218.0 lb

## 2018-06-18 VITALS — BP 132/60 | HR 72 | Wt 218.0 lb

## 2018-06-18 DIAGNOSIS — Z7982 Long term (current) use of aspirin: Secondary | ICD-10-CM | POA: Diagnosis not present

## 2018-06-18 DIAGNOSIS — J9611 Chronic respiratory failure with hypoxia: Secondary | ICD-10-CM | POA: Insufficient documentation

## 2018-06-18 DIAGNOSIS — Z79899 Other long term (current) drug therapy: Secondary | ICD-10-CM | POA: Diagnosis not present

## 2018-06-18 DIAGNOSIS — K219 Gastro-esophageal reflux disease without esophagitis: Secondary | ICD-10-CM | POA: Diagnosis not present

## 2018-06-18 DIAGNOSIS — Z87891 Personal history of nicotine dependence: Secondary | ICD-10-CM | POA: Diagnosis not present

## 2018-06-18 DIAGNOSIS — Z9981 Dependence on supplemental oxygen: Secondary | ICD-10-CM | POA: Diagnosis not present

## 2018-06-18 DIAGNOSIS — J849 Interstitial pulmonary disease, unspecified: Secondary | ICD-10-CM

## 2018-06-18 DIAGNOSIS — I872 Venous insufficiency (chronic) (peripheral): Secondary | ICD-10-CM | POA: Diagnosis not present

## 2018-06-18 DIAGNOSIS — J449 Chronic obstructive pulmonary disease, unspecified: Secondary | ICD-10-CM | POA: Diagnosis not present

## 2018-06-18 DIAGNOSIS — I5032 Chronic diastolic (congestive) heart failure: Secondary | ICD-10-CM

## 2018-06-18 DIAGNOSIS — I2721 Secondary pulmonary arterial hypertension: Secondary | ICD-10-CM

## 2018-06-18 DIAGNOSIS — E785 Hyperlipidemia, unspecified: Secondary | ICD-10-CM | POA: Insufficient documentation

## 2018-06-18 LAB — PULMONARY FUNCTION TEST
DL/VA % pred: 43 %
DL/VA: 2.23 ml/min/mmHg/L
DLCO COR: 8.12 ml/min/mmHg
DLCO UNC: 6.52 ml/min/mmHg
DLCO cor % pred: 28 %
DLCO unc % pred: 23 %
FEF 25-75 Pre: 1.82 L/sec
FEF2575-%Pred-Pre: 82 %
FEV1-%Pred-Pre: 82 %
FEV1-PRE: 1.9 L
FEV1FVC-%Pred-Pre: 101 %
FEV6-%PRED-PRE: 83 %
FEV6-PRE: 2.37 L
FEV6FVC-%Pred-Pre: 103 %
FVC-%PRED-PRE: 80 %
FVC-PRE: 2.37 L
PRE FEV1/FVC RATIO: 80 %
Pre FEV6/FVC Ratio: 100 %
RV % pred: 81 %
RV: 1.78 L
TLC % PRED: 73 %
TLC: 4.07 L

## 2018-06-18 LAB — BASIC METABOLIC PANEL
ANION GAP: 8 (ref 5–15)
BUN: 14 mg/dL (ref 8–23)
CALCIUM: 9.3 mg/dL (ref 8.9–10.3)
CO2: 31 mmol/L (ref 22–32)
Chloride: 101 mmol/L (ref 98–111)
Creatinine, Ser: 1.26 mg/dL — ABNORMAL HIGH (ref 0.44–1.00)
GFR, EST AFRICAN AMERICAN: 52 mL/min — AB (ref >60)
GFR, EST NON AFRICAN AMERICAN: 45 mL/min — AB (ref >60)
Glucose, Bld: 105 mg/dL — ABNORMAL HIGH (ref 70–99)
POTASSIUM: 4.1 mmol/L (ref 3.5–5.1)
Sodium: 140 mmol/L (ref 135–145)

## 2018-06-18 LAB — BRAIN NATRIURETIC PEPTIDE: B Natriuretic Peptide: 33.8 pg/mL (ref 0.0–100.0)

## 2018-06-18 LAB — MAGNESIUM: MAGNESIUM: 2.2 mg/dL (ref 1.7–2.4)

## 2018-06-18 MED ORDER — TIOTROPIUM BROMIDE MONOHYDRATE 2.5 MCG/ACT IN AERS: 2.0000 | INHALATION_SPRAY | Freq: Every day | RESPIRATORY_TRACT | 0 refills | Status: DC

## 2018-06-18 NOTE — Patient Instructions (Addendum)
Centrilobular emphysema: Continue Spiriva daily Continue using albuterol at least once a day, then every 4-6 hours as needed for chest tightness wheezing or shortness of breath Practice good hand hygiene Stay active Get a flu shot in the fall  Chronic respiratory failure with hypoxemia: We will check your oxygen level while walking on room air and with oxygen today For now continue using oxygen 3 L continuously at rest and 6L with exertion  Pulmonary hypertension: Continue taking OPSUMIT, Selexipeg, Riociguat as directed by the pulmonary hypertension clinic  Interstitial lung disease: Repeat High resolution CT chest now  Follow up with me in 3-4 months

## 2018-06-18 NOTE — Progress Notes (Signed)
Subjective:    Patient ID: Kelly Donaldson, female    DOB: [birth year], 62 y.o.   MRN: [id number]  Synopsis: First seen by [facility] in 2017 for evaluation of dyspnea in the setting of WHO group 3, and group 2 pulmonary hypertension. Has a nondescript interstitial lung disease with significant centrilobular emphysema.  Smoked from age [age] 1ppd with one 10 year break   HPI  Chief Complaint  Patient presents with  . Follow-up    PFT today   Kelly Donaldson says that the Spiriva has really helped with her breathing.  She can now do things like making up the bed which is a good help for her.  She says that she is taking the albuterol once a day which helps as well.  She is using the Spiriva daily.  She can now walk a little further in her house which is better.   She is using 2 L O2 all the time.    She is compliant with her pulmonary hypertension medicines, she takes 3 of them.  No recent leg swelling.   Past Medical History:  Diagnosis Date  . Allergy   . Depression   . GERD (gastroesophageal reflux disease)   . Hyperlipidemia   . Oxygen dependent   . Shortness of breath dyspnea   . Varicose veins      Review of Systems  Constitutional: Negative for chills, fatigue and fever.  HENT: Negative for postnasal drip, rhinorrhea and sinus pressure.   Respiratory: Positive for shortness of breath. Negative for cough and wheezing.        Objective:   Physical Exam  Vitals:   06/18/18 0958  BP: 132/60  Pulse: 72  SpO2: 92%  Weight: 218 lb (98.9 kg)  Height: 5\' 7"  (1.702 m)   3L [place]  Titrating walk performed today: 500 feet of walking, required 6 L of oxygen to maintain O2 saturation greater than 88%  Gen: well appearing HENT: OP clear, TM's clear, neck supple PULM: Crackles bases B, normal percussion CV: RRR, no mgr, trace edema GI: BS+, soft, nontender Derm: no cyanosis or rash Psyche: normal mood and affect   Echo: Bubble echo 04/13/15 with R to L  shunt   RHC: 04/16/15 Hemodynamics RA mean 18 RV 81/18 PA 83/33, mean 53 PCWP mean 19 Oxygen saturations: PA 63% AO 98% Cardiac Output (Fick) 5.82  Cardiac Index (Fick) 2.61 PVR 5.8 WU Rec:  Lasix/ masitentan   Right heart catheterization from May 2019 mean PA pressure 30, pulmonary capillary wedge pressure 13, cardiac index 5.1, PVR 1.6  Pulmonary function testing: 06/2018 Ratio 80% Forced vital capacity 2.4 L 80% predicted, total lung capacity 4.1 L 73% predicted, DLCO 6.52 mL 23% predict  Imaging: CT chest images April 2016  showing a fibrotic process in the upper lobes extending into the periphery of the lower lobes, certainly worse in the upper lobes and the lower lobes are appears to be groundglass in the upper lobes. There is no pulmonary embolism  08/2016 high-resolution CT scan of the chest: Rithwik Schmieg review: Significant upper lobe centrilobular emphysema .  Peripheral based interlobular septal thickening and groundglass consistent with fibrosis with some mild bronchiectasis, fibrotic changes appear to be more prominent in the lower lobes with upper lobe nodules, no air trapping noted on expiratory images, mediastinal lymphadenopathy stable per radiology  6 minute walk test 6 minute walk (7/16): 170 meters => unable to complete.  6 minute walk (9/16): 201 meters 6 minute walk (1/17): 256  meters 6 minute walk (4/17): 219 meters 6 minute walk (8/17): 225 meters 6 minute walk (11/17): 244 meters 11/26/2016 walk 255 m on 4 L nasal cannula. O2 saturation dropped transiently to 86%. O2 saturation improved with 5 L nasal cannula to 95% while exerting herself.   CBC    Component Value Date/Time   WBC 7.0 05/06/2018 0948   RBC 4.02 05/06/2018 0948   HGB 8.4 (L) 05/06/2018 0948   HCT 29.1 (L) 05/06/2018 0948   PLT 191 05/06/2018 0948   MCV 72.4 (L) 05/06/2018 0948   MCH 20.9 (L) 05/06/2018 0948   MCHC 28.9 (L) 05/06/2018 0948   RDW 17.4 (H) 05/06/2018 0948   LYMPHSABS 2.4  03/30/2015 1610   MONOABS 0.4 03/30/2015 1610   EOSABS 0.2 03/30/2015 1610   BASOSABS 0.0 03/30/2015 1610   Cardiology records reviewed where she was seen for her pulmonary hypertension     Assessment & Plan:   Interstitial pulmonary disease (HCC)  PAH (pulmonary artery hypertension) (HCC)  ILD (interstitial lung disease) (HCC)  Chronic respiratory failure with hypoxia (HCC)  Discussion: Kelly Donaldson is here to follow-up today in regards to her centrilobular emphysema, interstitial lung disease, and pulmonary hypertension.  She had symptomatic benefit with the addition of Spiriva and now using albuterol on a routine basis.  Her lung function testing today does not show evidence of interval worsening compared to 3 years ago so I do not suspect that the interstitial lung disease (what ever it is) is worsening.  However, I think there is value in repeating a high-resolution CT scan of the chest to validate this.  Plan: Centrilobular emphysema: Continue Spiriva daily Continue using albuterol at least once a day, then every 4-6 hours as needed for chest tightness wheezing or shortness of breath Practice good hand hygiene Stay active Get a flu shot in the fall  Chronic respiratory failure with hypoxemia: We will check your oxygen level while walking on room air and with oxygen today For now continue using oxygen 3 L continuously at rest and 6L with exertion  Pulmonary hypertension: Continue taking OPSUMIT, Selex[place], Riociguat as directed by the pulmonary hypertension clinic  Interstitial lung disease: Repeat High resolution CT chest now  Follow up with me in 3-4 months  > 15 min spent with patient, 25 min visit   Current Outpatient Medications:  .  aspirin EC 81 MG tablet, Take 1 tablet (81 mg total) by mouth daily., Disp: 90 tablet, Rfl: 3 .  citalopram (CELEXA) 40 MG tablet, TAKE 1 TABLET EVERY DAY, Disp: 90 tablet, Rfl: 0 .  fluticasone (FLONASE) 50 MCG/ACT nasal spray, Place  1 spray into both nostrils daily., Disp: 48 g, Rfl: 3 .  HYDROcodone-acetaminophen (NORCO) 10-325 MG tablet, Take 1-2 tablets by mouth every 6 (six) hours as needed., Disp: 240 tablet, Rfl: 0 .  HYDROcodone-acetaminophen (NORCO) 10-325 MG tablet, Take 1-2 tablets by mouth every 6 (six) hours as needed., Disp: 240 tablet, Rfl: 0 .  HYDROcodone-homatropine (HYCODAN) 5-1.5 MG/5ML syrup, Take 5-10 mLs by mouth every 6 (six) hours as needed., Disp: 240 mL, Rfl: 0 .  Ipratropium-Albuterol (COMBIVENT RESPIMAT) 20-100 MCG/ACT AERS respimat, Inhale 1 puff into the lungs every 6 (six) hours as needed for wheezing., Disp: 3 Inhaler, Rfl: 3 .  ipratropium-albuterol (DUONEB) 0.5-2.5 (3) MG/3ML SOLN, Take 3 mLs by nebulization 4 (four) times daily as needed. (Patient taking differently: Take 3 mLs by nebulization 4 (four) times daily as needed (for wheezing/shortness of breath). ), Disp: 360 mL,  Rfl: 11 .  Macitentan (OPSUMIT) 10 MG TABS, Take 1 tablet (10 mg total) by mouth daily., Disp: 90 tablet, Rfl: 3 .  OXYGEN, Take 2.5-4 L by mouth See admin instructions. 2.5 L with resting state & 4 L with exertion [facility], Disp: , Rfl:  .  Polyethyl Glycol-Propyl Glycol (LUBRICANT EYE DROPS) 0.4-0.3 % SOLN, Place 1 drop into both eyes daily., Disp: , Rfl:  .  potassium chloride SA (KLOR-CON M20) 20 MEQ tablet, TAKE 1 TABLET (20 MEQ TOTAL) BY MOUTH 2 (TWO) TIMES DAILY., Disp: 180 tablet, Rfl: 3 .  pravastatin (PRAVACHOL) 20 MG tablet, Take 1 tablet (20 mg total) by mouth daily., Disp: 90 tablet, Rfl: 3 .  Riociguat (ADEMPAS) 2 MG TABS, Take 2 mg by mouth 3 (three) times daily., Disp: 90 tablet, Rfl: 11 .  Selexipag 200 MCG TABS, Take 2 tablets (400 mcg total) by mouth 2 (two) times daily. (Patient taking differently: Take 200 mcg by mouth 2 (two) times daily. ), Disp: 120 tablet, Rfl: 6 .  Tiotropium Bromide Monohydrate (SPIRIVA RESPIMAT) 2.5 MCG/ACT AERS, Inhale 2 puffs into the lungs daily., Disp: 4 g, Rfl: 5 .   torsemide (DEMADEX) 20 MG tablet, Take 2 tablets (40 mg total) by mouth daily., Disp: 180 tablet, Rfl: 3 .  Vitamin D, Ergocalciferol, (DRISDOL) 50000 units CAPS capsule, Take 1 capsule (50,000 Units total) by mouth every Monday., Disp: 12 capsule, Rfl: 3 .  zolpidem (AMBIEN) 5 MG tablet, Take 1 tablet (5 mg total) by mouth at bedtime., Disp: 30 tablet, Rfl: 5 .  HYDROcodone-acetaminophen (NORCO) 10-325 MG tablet, Take 1-2 tablets by mouth every 6 (six) hours as needed. (Patient not taking: Reported on 06/18/2018), Disp: 240 tablet, Rfl: 0 .  Tiotropium Bromide Monohydrate (SPIRIVA RESPIMAT) 2.5 MCG/ACT AERS, Inhale 2 puffs into the lungs daily., Disp: 1 Inhaler, Rfl: 0

## 2018-06-18 NOTE — Patient Instructions (Signed)
Labs drawn today (if we do not call you, then your lab work was stable)   Your physician recommends that you schedule a follow-up appointment in: 3 months with [name]  Please Call an Schedule Appointment (call in August)

## 2018-06-18 NOTE — Progress Notes (Signed)
PFT done today. 

## 2018-06-19 NOTE — Progress Notes (Signed)
Patient ID: [name], female   DOB: Feb 02, 1956, 62 y.o.   MRN: [id number]   PCP: Prudy Feeler Pulmonology: Dr [name] Primary HF: Dr Shirlee Latch   Ms Vaquerano is a 62 y.o. with history of chronic hypoxemic respiratory failure and pulmonary hypertension.  She has been followed by Dr [name], now follows with Dr. [name] with pulmonology.  Has been on home oxygen for several years now.  She quit smoking in 2016.  She had an echo done in 5/16 showing dilated RV with severe pulmonary hypertension.  She therefore had RHC in 5/16 which confirmed severe pulmonary hypertension and RV failure.  PFTs showed only mild obstruction and restriction with markedly decreased DLCO suggestive of pulmonary vascular disease.  There was concern for sarcoidosis on CT chest but lymph node biopsy showed no evidence for sarcoidosis.  V/Q scan showed no evidence for chronic PE.   She has not tolerated Revatio or Adcirca.  Have failed previous up-titration of Selexipag with pelvic pain, can tolerate 200 mcg bid.  She is now tolerating riociguat 2 mg tid.   Started torsemide in Oct. 2017, has had better diuresis.   Due to ongoing dyspnea, she had repeat RHC in 5/19 that showed normal filling pressures and well-controlled pulmonary hypertension.  Echo in 6/19 showed normal LV EF, normal RV systolic function, and mild RV dilation.   She saw Dr. [name] and was started on Spiriva.  Subsequently, her breathing seemed to improve.   Pt presents today for followup of pulmonary hypertension.  Weight is down 7 lbs.  She is on 3L home oxygen.  Breathing has improved on Spiriva.  She is able to do housework and make up her bed without dyspnea. She can walk for 2-3 minutes on flat ground without stopping.  No lightheadedness/syncope. No chest pain.   6 minute walk (5/16): 238 meters => decreased oxygen saturation to 73%, increased to 6 L [place] 6 minute walk (7/16): 170 meters => unable to complete.  6 minute walk (9/16): 201 meters 6 minute walk  (1/17): 256 meters 6 minute walk (4/17): 219 meters 6 minute walk (8/17): 225 meters 6 minute walk (11/17): 244 meters 6 minute walk (5/18): 229 meters 6 minute walk (2/19): 122 meters (but she was stopped halfway through with drop in oxygen saturation.   Labs (2/16): BNP 69 Labs (5/16): LFTs normal, HCT 34.9, RF negative, HIV negative, TSH negative Labs (6/16): K 4.4, creatinine 0.88 Labs (7/16): ANA negative, BNP 58, K 4.3, creatinine 1.1, anti-RNP negative Labs (9/16): K 4.4, creatinine 1.26, BNP 91 Labs (10/16): K 4.3, creatinine 1.04 Labs (12/16): K 4.2, creatinine 1.07, BNP 130 Labs (1/17): K 4.3, creatinine 1.23, BNP 42 Labs (3/17): K 4.2, creatinine 1.19 Labs (5/17): K 4.3, creatinine 1.35, BNP 44 Labs (6/17): K 4.4, creatinine 1.18, BNP 44 Labs (10/17): K 4.2, creatinine 0.98, BNP 43 Labs (12/17): K 4.5, creatinine 1.23. BNP 64.5 Labs (2/18): K 4.5, creatinine 1.21, BNP 64 Labs (4/18): K 4.1, creatinine 1.24, BNP 26 Labs (9/18): K 4.6, creatinine 1.25 Labs (2/19): K 4.2, creatinine 1.39 Labs (5/19): K 4, creatinine 1.4, hgb 8.4 . PMH: 1. Pulmonary hypertension: RHC (5/16) with mean RA 18, PA 83/33 mean 53, mean PCWP 19, CI 2.61, PVR 5.8 WU.   Echo (5/16) with EF 65-70%, septal flattening, dilated RV with PA systolic pressure 99 mmHg, +bubble study.  PFTs (4/16) with FVC 76%, FEV1 75%, ratio 97%, TLC 66%, DLCO 26% => mild restriction, mild obstruction, severe diffuse abnormality (pulmonary vascular  problem).  CTA chest (4/16) with chronic interstitial and obstructive lung disease, small mediastinal and hilar lymph noes, no PE.  Lymph node biopsy negative for sarcoidosis (reactive changes).  V/Q scan (6/16): No acute or chronic PE. TEE (6/16): Normal LV size and systolic function, EF 55-60%,RV was mildly dilated with mildly decreased systolic function, D-shaped interventricular septum suggestive of RV pressure/volume overload, there appeared to be a small PFO present with some  bubbles crossing, no ASD.  ANA negative, anti-RNP negative, HIV negative. Sleep study (8/16) without OSA.  She did not tolerate Adcirca or Revatio.  She cannot tolerate more than 200 mcg bid Selexipag.  - Echo (6/17): EF 60-65%, normal diastolic function, D-shaped interventricular septum, mildly dilated RV with normal RV systolic function, PASP 39, IVC normal.  - Echo (6/18): EF 60-65%, normal diastolic function, D-shaped interventricular septum, mildly dilated RV with normal RV systolic function, PASP 65 mmHg.  - RHC (7/18): RA 13, PA 51/23 mean 36, PCWP 19, CI 4.74, PVR 1.6 WU - RHC (5/19): mean RA 8, PA 48/20 mean 30, mean PCWP 13, CI 5.06, PVR 1.58 WU.  - Echo (6/19): EF 60-65%, mild RV dilation with normal RV systolic function, PASP 60, possible PFO.  2. Chronic venous insufficiency. 3. Hyperlipidemia.  4. GERD 5. Chronic hypoxemic respiratory failure: Hypoxemia, on home oxygen.  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  - PFTs (7/19): FEV1 82%, FVC 80%, ratio 101%, TLC 73%, DLCO 23%.   SH: Married, quit smoking in 3/16.  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    FH: No pulmonary hypertension, no premature CAD, no sudden death.   Review of systems complete and found to be negative unless listed in HPI.    Current Outpatient Medications  Medication Sig Dispense Refill  . aspirin EC 81 MG tablet Take 1 tablet (81 mg total) by mouth daily. 90 tablet 3  . citalopram (CELEXA) 40 MG tablet TAKE 1 TABLET EVERY DAY 90 tablet 0  . fluticasone (FLONASE) 50 MCG/ACT nasal spray Place 1 spray into both nostrils daily. 48 g 3  . HYDROcodone-acetaminophen (NORCO) 10-325 MG tablet Take 1-2 tablets by mouth every 6 (six) hours as needed. 240 tablet 0  . HYDROcodone-acetaminophen (NORCO) 10-325 MG tablet Take 1-2 tablets by mouth every 6 (six) hours as needed. 240 tablet 0  . HYDROcodone-homatropine (HYCODAN) 5-1.5 MG/5ML syrup Take 5-10 mLs by mouth every 6  (six) hours as needed. 240 mL 0  . Ipratropium-Albuterol (COMBIVENT RESPIMAT) 20-100 MCG/ACT AERS respimat Inhale 1 puff into the lungs every 6 (six) hours as needed for wheezing. 3 Inhaler 3  . ipratropium-albuterol (DUONEB) 0.5-2.5 (3) MG/3ML SOLN Take 3 mLs by nebulization 4 (four) times daily as needed. (Patient taking differently: Take 3 mLs by nebulization 4 (four) times daily as needed (for wheezing/shortness of breath). ) 360 mL 11  . Macitentan (OPSUMIT) 10 MG TABS Take 1 tablet (10 mg total) by mouth daily. 90 tablet 3  . OXYGEN Take 2.5-4 L by mouth See admin instructions. 2.5 L with resting state & 4 L with exertion [facility]    . Polyethyl Glycol-Propyl Glycol (LUBRICANT EYE DROPS) 0.4-0.3 % SOLN Place 1 drop into both eyes daily.    . potassium chloride SA (KLOR-CON M20) 20 MEQ tablet TAKE 1 TABLET (20 MEQ TOTAL) BY MOUTH 2 (TWO) TIMES DAILY. 180 tablet 3  . pravastatin (PRAVACHOL) 20 MG tablet Take 1 tablet (20 mg total) by mouth daily. 90 tablet 3  .  Riociguat (ADEMPAS) 2 MG TABS Take 2 mg by mouth 3 (three) times daily. 90 tablet 11  . Selexipag 200 MCG TABS Take 200 mcg by mouth 2 (two) times daily.    . Tiotropium Bromide Monohydrate (SPIRIVA RESPIMAT) 2.5 MCG/ACT AERS Inhale 2 puffs into the lungs daily. 4 g 5  . Tiotropium Bromide Monohydrate (SPIRIVA RESPIMAT) 2.5 MCG/ACT AERS Inhale 2 puffs into the lungs daily. 1 Inhaler 0  . torsemide (DEMADEX) 20 MG tablet Take 2 tablets (40 mg total) by mouth daily. 180 tablet 3  . Vitamin D, Ergocalciferol, (DRISDOL) 50000 units CAPS capsule Take 1 capsule (50,000 Units total) by mouth every Monday. 12 capsule 3  . zolpidem (AMBIEN) 5 MG tablet Take 1 tablet (5 mg total) by mouth at bedtime. 30 tablet 5   No current facility-administered medications for this encounter.    BP 132/60   Pulse 72   Wt 218 lb (98.9 kg)   SpO2 92% Comment: on 3L of O2  BMI 34.14 kg/m   Wt Readings from Last 3 Encounters:  06/18/18 218 lb (98.9 kg)    06/18/18 218 lb (98.9 kg)  06/02/18 216 lb 9.6 oz (98.2 kg)    General: NAD Neck: No JVD, no thyromegaly or thyroid nodule.  Lungs: Slight crackles at bases.  CV: Nondisplaced PMI.  Heart regular S1/S2, no S3/S4, no murmur.  Trace ankle edema.  No carotid bruit.  Normal pedal pulses.  Abdomen: Soft, nontender, no hepatosplenomegaly, no distention.  Skin: Intact without lesions or rashes.  Neurologic: Alert and oriented x 3.  Psych: Normal affect. Extremities: No clubbing or cyanosis.  HEENT: Normal.   Assessment/Plan:  1. Pulmonary arterial hypertension with RV failure: Severe on 5/16 RHC. 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  There was concern from CTA chest for sarcoidosis, but lymph node biopsy was negative.  LFTs normal, ANA negative, anti-RNP negative, HIV negative.  V/Q scan negative for chronic PE.  Small PFO but no ASD on TEE.  No OSA on sleep study.  She did not tolerate Revatio due to joint pain (now resolved), and she did not tolerate Adcirca with worsening dyspnea.  She does not tolerate more than 200 mcg bid Selexipag due to pelvic pain.  Most recent echo in 6/19 showed normal LV EF, normal RV systolic function with mild RV dilation, and PASP 60.  RHCs in 7/18 and 5/19 have shown well-compensated pulmonary hypertension.  PVR was 1.58 WU on 5/19 study. She is improved symptomatically. I think that her residual symptoms are due to COPD + ILD rather than pulmonary hypertension.  - She had 6 minute walk today at pulmonary.  - Open lung biopsy deemed too risky, probably not sarcoid per pulmonary evaluation.    - Continue opsumit and riociguat 2 mg twice a day (unable to go higher due to dizziness).   - She has tolerated selexipag 200 mcg bid but has not been able to increase.  2. Chronic hypoxemic respiratory failure: Chronic interstitial lung disease + emphysema.  Concern for sarcoidosis but  biopsy did not show sarcoid.  Deemed too high risk for open lung biopsy.  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 - O2 sats stable and doing better overall on Spiriva.  3. Chronic diastolic CHF: She does not look volume overloaded on exam.   - Continue torsemide 60 qam/40 qpm.  - BMET/BNP today.   Followup in 3 months.  [name], MD  06/19/2018

## 2018-06-23 ENCOUNTER — Other Ambulatory Visit (HOSPITAL_COMMUNITY): Payer: Self-pay | Admitting: *Deleted

## 2018-06-23 MED ORDER — RIOCIGUAT 2 MG PO TABS: 2.0000 mg | ORAL_TABLET | Freq: Three times a day (TID) | ORAL | 11 refills | Status: DC

## 2018-07-06 ENCOUNTER — Other Ambulatory Visit: Payer: Self-pay | Admitting: Physician Assistant

## 2018-07-07 ENCOUNTER — Ambulatory Visit (HOSPITAL_COMMUNITY)
Admission: RE | Admit: 2018-07-07 | Discharge: 2018-07-07 | Disposition: A | Discharge status: Home / Self Care | Payer: Medicare PPO | Source: Ambulatory Visit | Attending: Adult Health | Admitting: Adult Health

## 2018-07-07 DIAGNOSIS — J438 Other emphysema: Secondary | ICD-10-CM | POA: Diagnosis not present

## 2018-07-07 DIAGNOSIS — I2510 Atherosclerotic heart disease of native coronary artery without angina pectoris: Secondary | ICD-10-CM | POA: Insufficient documentation

## 2018-07-07 DIAGNOSIS — I700 Atherosclerosis of aorta: Secondary | ICD-10-CM | POA: Diagnosis not present

## 2018-07-07 DIAGNOSIS — J432 Centrilobular emphysema: Secondary | ICD-10-CM | POA: Diagnosis not present

## 2018-07-07 DIAGNOSIS — J849 Interstitial pulmonary disease, unspecified: Secondary | ICD-10-CM | POA: Insufficient documentation

## 2018-07-17 DIAGNOSIS — J449 Chronic obstructive pulmonary disease, unspecified: Secondary | ICD-10-CM | POA: Diagnosis not present

## 2018-08-17 ENCOUNTER — Other Ambulatory Visit (HOSPITAL_COMMUNITY): Payer: Self-pay | Admitting: *Deleted

## 2018-08-17 DIAGNOSIS — J449 Chronic obstructive pulmonary disease, unspecified: Secondary | ICD-10-CM | POA: Diagnosis not present

## 2018-08-17 MED ORDER — SELEXIPAG 200 MCG PO TABS: 200.0000 ug | ORAL_TABLET | Freq: Two times a day (BID) | ORAL | 6 refills | Status: DC

## 2018-08-18 ENCOUNTER — Ambulatory Visit: Payer: Medicare PPO | Admitting: Physician Assistant

## 2018-08-19 ENCOUNTER — Ambulatory Visit (INDEPENDENT_AMBULATORY_CARE_PROVIDER_SITE_OTHER): Payer: Medicare PPO | Admitting: Physician Assistant

## 2018-08-19 ENCOUNTER — Encounter: Payer: Self-pay | Admitting: Physician Assistant

## 2018-08-19 VITALS — BP 94/57 | HR 83 | Temp 97.3°F | Ht 67.0 in | Wt 217.0 lb

## 2018-08-19 DIAGNOSIS — J40 Bronchitis, not specified as acute or chronic: Secondary | ICD-10-CM

## 2018-08-19 DIAGNOSIS — R252 Cramp and spasm: Secondary | ICD-10-CM

## 2018-08-19 DIAGNOSIS — Z0000 Encounter for general adult medical examination without abnormal findings: Secondary | ICD-10-CM

## 2018-08-19 DIAGNOSIS — M5136 Other intervertebral disc degeneration, lumbar region: Secondary | ICD-10-CM

## 2018-08-19 DIAGNOSIS — M51369 Other intervertebral disc degeneration, lumbar region without mention of lumbar back pain or lower extremity pain: Secondary | ICD-10-CM

## 2018-08-19 MED ORDER — HYDROCODONE-ACETAMINOPHEN 10-325 MG PO TABS: 1.0000 | ORAL_TABLET | Freq: Four times a day (QID) | ORAL | 0 refills | Status: DC | PRN

## 2018-08-19 MED ORDER — HYDROCODONE-HOMATROPINE 5-1.5 MG/5ML PO SYRP: 5.0000 mL | ORAL_SOLUTION | Freq: Four times a day (QID) | ORAL | 0 refills | Status: DC | PRN

## 2018-08-19 MED ORDER — ZOLPIDEM TARTRATE 5 MG PO TABS: 5.0000 mg | ORAL_TABLET | Freq: Every day | ORAL | 5 refills | Status: DC

## 2018-08-19 NOTE — Patient Instructions (Signed)
Magnesium supplement 

## 2018-08-19 NOTE — Progress Notes (Signed)
BP (!) 94/57   Pulse 83   Temp (!) 97.3 F (36.3 C) (Oral)   Ht '5\' 7"'$  (1.702 m)   Wt 217 lb (98.4 kg)   BMI 33.99 kg/m     Subjective:    Patient ID: Kelly Donaldson, female    DOB: [birth year], 63 y.o.   MRN: [id number]  HPI: LAVONYA Donaldson is a 62 y.o. female presenting on 08/19/2018 for Congestive Heart Failure (3 mth follow up); Hypertension; and Hyperlipidemia  Patient comes in for 20-monthrecheck on her chronic medical conditions.  Overall she is fairly stable with everything.  Her cardiologist and pulmonologist are very happy with her current situation with her pulmonary hypertension.  She is starting to be a little more because her breathing is so much better.  She is due some labs.  At times she occasionally has some leg cramping.  We will also add a magnesium in for this.  Past Medical History:  Diagnosis Date  . Allergy   . Depression   . GERD (gastroesophageal reflux disease)   . Hyperlipidemia   . Oxygen dependent   . Shortness of breath dyspnea   . Varicose veins    Relevant past medical, surgical, family and social history reviewed and updated as indicated. Interim medical history since our last visit reviewed. Allergies and medications reviewed and updated. DATA REVIEWED: CHART IN EPIC  Family History reviewed for pertinent findings.  Review of Systems  Allergies as of 08/19/2018      Reactions   Gabapentin Other (See Comments)   Pt states that she can not take with antidepressants   Latex Rash      Medication List        Accurate as of 08/19/18 11:59 PM. Always use your most recent med list.          aspirin EC 81 MG tablet Take 1 tablet (81 mg total) by mouth daily.   citalopram 40 MG tablet Commonly known as:  CELEXA TAKE 1 TABLET EVERY DAY   fluticasone 50 MCG/ACT nasal spray Commonly known as:  FLONASE Place 1 spray into both nostrils daily.   HYDROcodone-acetaminophen 10-325 MG tablet Commonly known as:  NORCO Take 1-2 tablets by  mouth every 6 (six) hours as needed.   HYDROcodone-acetaminophen 10-325 MG tablet Commonly known as:  NORCO Take 1-2 tablets by mouth every 6 (six) hours as needed.   HYDROcodone-acetaminophen 10-325 MG tablet Commonly known as:  NORCO Take 1-2 tablets by mouth every 6 (six) hours as needed.   HYDROcodone-homatropine 5-1.5 MG/5ML syrup Commonly known as:  HYCODAN Take 5-10 mLs by mouth every 6 (six) hours as needed.   ipratropium-albuterol 0.5-2.5 (3) MG/3ML Soln Commonly known as:  DUONEB INHALE THE CONTENTS OF 1 VIAL VIA NEBULIZER FOUR TIMES DAILY AS NEEDED   LUBRICANT EYE DROPS 0.4-0.3 % Soln Generic drug:  Polyethyl Glycol-Propyl Glycol Place 1 drop into both eyes daily.   macitentan 10 MG tablet Commonly known as:  OPSUMIT Take 1 tablet (10 mg total) by mouth daily.   OXYGEN Take 2.5-4 L by mouth See admin instructions. 2.5 L with resting state & 4 L with exertion [facility]   potassium chloride SA 20 MEQ tablet Commonly known as:  K-DUR,KLOR-CON TAKE 1 TABLET (20 MEQ TOTAL) BY MOUTH 2 (TWO) TIMES DAILY.   pravastatin 20 MG tablet Commonly known as:  PRAVACHOL Take 1 tablet (20 mg total) by mouth daily.   Riociguat 2 MG Tabs Take 2  mg by mouth 3 (three) times daily.   Selexipag 200 MCG Tabs Take 1 tablet (200 mcg total) by mouth 2 (two) times daily.   Tiotropium Bromide Monohydrate 2.5 MCG/ACT Aers Inhale 2 puffs into the lungs daily.   Tiotropium Bromide Monohydrate 2.5 MCG/ACT Aers Inhale 2 puffs into the lungs daily.   torsemide 20 MG tablet Commonly known as:  DEMADEX Take 2 tablets (40 mg total) by mouth daily.   Vitamin D (Ergocalciferol) 50000 units Caps capsule Commonly known as:  DRISDOL Take 1 capsule (50,000 Units total) by mouth every Monday.   zolpidem 5 MG tablet Commonly known as:  AMBIEN Take 1 tablet (5 mg total) by mouth at bedtime.          Objective:    BP (!) 94/57   Pulse 83   Temp (!) 97.3 F (36.3 C) (Oral)   Ht 5'  7" (1.702 m)   Wt 217 lb (98.4 kg)   BMI 33.99 kg/m    Allergies  Allergen Reactions  . Gabapentin Other (See Comments)    Pt states that she can not take with antidepressants  . Latex Rash    Wt Readings from Last 3 Encounters:  08/19/18 217 lb (98.4 kg)  06/18/18 218 lb (98.9 kg)  06/18/18 218 lb (98.9 kg)    Physical Exam  Results for orders placed or performed during the hospital encounter of 15/40/08  Basic metabolic panel  Result Value Ref Range   Sodium 140 135 - 145 mmol/L   Potassium 4.1 3.5 - 5.1 mmol/L   Chloride 101 98 - 111 mmol/L   CO2 31 22 - 32 mmol/L   Glucose, Bld 105 (H) 70 - 99 mg/dL   BUN 14 8 - 23 mg/dL   Creatinine, Ser 1.26 (H) 0.44 - 1.00 mg/dL   Calcium 9.3 8.9 - 10.3 mg/dL   GFR calc non Af Amer 45 (L) >60 mL/min   GFR calc Af Amer 52 (L) >60 mL/min   Anion gap 8 5 - 15  Brain natriuretic peptide  Result Value Ref Range   B Natriuretic Peptide 33.8 0.0 - 100.0 pg/mL  Magnesium  Result Value Ref Range   Magnesium 2.2 1.7 - 2.4 mg/dL      Assessment & Plan:   1. DDD (degenerative disc disease), lumbar - HYDROcodone-acetaminophen (NORCO) 10-325 MG tablet; Take 1-2 tablets by mouth every 6 (six) hours as needed.  Dispense: 240 tablet; Refill: 0  2. Bronchitis - HYDROcodone-homatropine (HYCODAN) 5-1.5 MG/5ML syrup; Take 5-10 mLs by mouth every 6 (six) hours as needed.  Dispense: 240 mL; Refill: 0  3. Leg cramps - Magnesium; Future - Iron; Future  4. Well adult exam - CBC with Differential/Platelet; Future - CMP14+EGFR; Future - Lipid panel; Future - TSH; Future    Continue all other maintenance medications as listed above.  Follow up plan: Return in about 3 months (around 11/18/2018).  Educational handout given for [place] PA-C [facility] [address]  [place], [place] [address] [phone]   08/20/2018, 11:08 AM

## 2018-08-26 ENCOUNTER — Other Ambulatory Visit (HOSPITAL_COMMUNITY): Payer: Self-pay

## 2018-08-26 ENCOUNTER — Other Ambulatory Visit (HOSPITAL_COMMUNITY): Payer: Self-pay | Admitting: *Deleted

## 2018-08-27 NOTE — Telephone Encounter (Signed)
Opened in error

## 2018-09-03 ENCOUNTER — Other Ambulatory Visit (HOSPITAL_COMMUNITY): Payer: Self-pay

## 2018-09-03 DIAGNOSIS — I2729 Other secondary pulmonary hypertension: Principal | ICD-10-CM

## 2018-09-03 DIAGNOSIS — D869 Sarcoidosis, unspecified: Secondary | ICD-10-CM

## 2018-09-03 MED ORDER — POTASSIUM CHLORIDE CRYS ER 20 MEQ PO TBCR: EXTENDED_RELEASE_TABLET | ORAL | 0 refills | Status: DC

## 2018-09-03 MED ORDER — TORSEMIDE 20 MG PO TABS: 40.0000 mg | ORAL_TABLET | Freq: Two times a day (BID) | ORAL | 1 refills | Status: DC

## 2018-09-09 ENCOUNTER — Other Ambulatory Visit: Payer: Self-pay | Admitting: Physician Assistant

## 2018-09-09 ENCOUNTER — Ambulatory Visit: Payer: Medicare PPO | Admitting: Physician Assistant

## 2018-09-09 DIAGNOSIS — F330 Major depressive disorder, recurrent, mild: Secondary | ICD-10-CM

## 2018-09-16 DIAGNOSIS — J449 Chronic obstructive pulmonary disease, unspecified: Secondary | ICD-10-CM | POA: Diagnosis not present

## 2018-10-07 ENCOUNTER — Other Ambulatory Visit: Payer: Self-pay | Admitting: Physician Assistant

## 2018-10-09 DIAGNOSIS — E876 Hypokalemia: Secondary | ICD-10-CM | POA: Diagnosis not present

## 2018-10-09 DIAGNOSIS — I272 Pulmonary hypertension, unspecified: Secondary | ICD-10-CM | POA: Diagnosis not present

## 2018-10-09 DIAGNOSIS — G4700 Insomnia, unspecified: Secondary | ICD-10-CM | POA: Diagnosis not present

## 2018-10-09 DIAGNOSIS — E669 Obesity, unspecified: Secondary | ICD-10-CM | POA: Diagnosis not present

## 2018-10-09 DIAGNOSIS — F3340 Major depressive disorder, recurrent, in remission, unspecified: Secondary | ICD-10-CM | POA: Diagnosis not present

## 2018-10-09 DIAGNOSIS — J309 Allergic rhinitis, unspecified: Secondary | ICD-10-CM | POA: Diagnosis not present

## 2018-10-09 DIAGNOSIS — F1021 Alcohol dependence, in remission: Secondary | ICD-10-CM | POA: Diagnosis not present

## 2018-10-09 DIAGNOSIS — I110 Hypertensive heart disease with heart failure: Secondary | ICD-10-CM | POA: Diagnosis not present

## 2018-10-09 DIAGNOSIS — E785 Hyperlipidemia, unspecified: Secondary | ICD-10-CM | POA: Diagnosis not present

## 2018-10-17 DIAGNOSIS — J449 Chronic obstructive pulmonary disease, unspecified: Secondary | ICD-10-CM | POA: Diagnosis not present

## 2018-11-12 ENCOUNTER — Encounter: Payer: Self-pay | Admitting: Pulmonary Disease

## 2018-11-12 ENCOUNTER — Ambulatory Visit (INDEPENDENT_AMBULATORY_CARE_PROVIDER_SITE_OTHER): Payer: Medicare PPO | Admitting: Pulmonary Disease

## 2018-11-12 VITALS — BP 104/50 | HR 86 | Ht 67.24 in | Wt 209.8 lb

## 2018-11-12 DIAGNOSIS — J9611 Chronic respiratory failure with hypoxia: Secondary | ICD-10-CM | POA: Diagnosis not present

## 2018-11-12 DIAGNOSIS — I2721 Secondary pulmonary arterial hypertension: Secondary | ICD-10-CM

## 2018-11-12 DIAGNOSIS — J432 Centrilobular emphysema: Secondary | ICD-10-CM | POA: Diagnosis not present

## 2018-11-12 DIAGNOSIS — Z23 Encounter for immunization: Secondary | ICD-10-CM | POA: Diagnosis not present

## 2018-11-12 DIAGNOSIS — J849 Interstitial pulmonary disease, unspecified: Secondary | ICD-10-CM

## 2018-11-12 MED ORDER — REVEFENACIN 175 MCG/3ML IN SOLN: 175.0000 ug | Freq: Every day | RESPIRATORY_TRACT | 0 refills | Status: DC

## 2018-11-12 NOTE — Progress Notes (Signed)
Subjective:    Patient ID: Kelly Donaldson, female    DOB: 1956/01/26, 62 y.o.   MRN: [id number]  Synopsis: First seen by [facility] in 2017 for evaluation of dyspnea in the setting of WHO group 3, and group 2 pulmonary hypertension. Has a nondescript interstitial lung disease with significant centrilobular emphysema.  Smoked from age [age] 1ppd with one 10 year break   HPI  Chief Complaint  Patient presents with  . Follow-up    Pt states breathing has improved since she stop taking Spiriva.   Brennyn says taht her breathing would worsen after taking the Spiriva.  She stopped and now feels better.  She doesn't cough or wheeze.  She uses her albuterol regularly.  She says that she typically uses the albuterol after smoke exposure, or if the weather changes and makes her breathe differently.  She says that she continues to take her pulmonary hypertension medicines and use and benefit from her oxygen.  Past Medical History:  Diagnosis Date  . Allergy   . Depression   . GERD (gastroesophageal reflux disease)   . Hyperlipidemia   . Oxygen dependent   . Shortness of breath dyspnea   . Varicose veins      Review of Systems  Constitutional: Negative for chills, fatigue and fever.  HENT: Negative for postnasal drip, rhinorrhea and sinus pressure.   Respiratory: Positive for shortness of breath. Negative for cough and wheezing.        Objective:   Physical Exam  Vitals:   11/12/18 1205 11/12/18 1206  BP:  (!) 104/50  Pulse: 86 86  SpO2: 92% 92%  Weight:  209 lb 12.8 oz (95.2 kg)  Height:  5' 7.24" (1.708 m)   3L [place]   Gen: chronically ill appearing HENT: OP clear, TM's clear, neck supple PULM: Few crackles R base B, normal percussion CV: RRR, no mgr, trace edema GI: BS+, soft, nontender Derm: no cyanosis or rash Psyche: normal mood and affect   Echo: Bubble echo 04/13/15 with R to L shunt   RHC: 04/16/15 Hemodynamics RA mean 18 RV 81/18 PA 83/33, mean 53 PCWP mean  19 Oxygen saturations: PA 63% AO 98% Cardiac Output (Fick) 5.82  Cardiac Index (Fick) 2.61 PVR 5.8 WU Rec:  Lasix/ masitentan   Right heart catheterization from May 2019 mean PA pressure 30, pulmonary capillary wedge pressure 13, cardiac index 5.1, PVR 1.6  Pulmonary function testing: 06/2018 Ratio 80% Forced vital capacity 2.4 L 80% predicted, total lung capacity 4.1 L 73% predicted, DLCO 6.52 mL 23% predict  Imaging: CT chest images April 2016  showing a fibrotic process in the upper lobes extending into the periphery of the lower lobes, certainly worse in the upper lobes and the lower lobes are appears to be groundglass in the upper lobes. There is no pulmonary embolism 08/2016 high-resolution CT scan of the chest: Tami Blass review: Significant upper lobe centrilobular emphysema .  Peripheral based interlobular septal thickening and groundglass consistent with fibrosis with some mild bronchiectasis, fibrotic changes appear to be more prominent in the lower lobes with upper lobe nodules, no air trapping noted on expiratory images, mediastinal lymphadenopathy stable per radiology July 2019 CT chest: Images independently reviewed showing upper lobe predominant mild centrilobular emphysema, some nonspecific interlobular septal thickening and change in the bases.  6 minute walk test 6 minute walk (7/16): 170 meters => unable to complete.  6 minute walk (9/16): 201 meters 6 minute walk (1/17): 256 meters 6 minute walk (  4/17): 219 meters 6 minute walk (8/17): 225 meters 6 minute walk (11/17): 244 meters 11/26/2016 walk 255 m on 4 L nasal cannula. O2 saturation dropped transiently to 86%. O2 saturation improved with 5 L nasal cannula to 95% while exerting herself.   CBC    Component Value Date/Time   WBC 7.0 05/06/2018 0948   RBC 4.02 05/06/2018 0948   HGB 8.4 (L) 05/06/2018 0948   HCT 29.1 (L) 05/06/2018 0948   PLT 191 05/06/2018 0948   MCV 72.4 (L) 05/06/2018 0948   MCH 20.9 (L)  05/06/2018 0948   MCHC 28.9 (L) 05/06/2018 0948   RDW 17.4 (H) 05/06/2018 0948   LYMPHSABS 2.4 03/30/2015 1610   MONOABS 0.4 03/30/2015 1610   EOSABS 0.2 03/30/2015 1610   BASOSABS 0.0 03/30/2015 1610        Assessment & Plan:   PAH (pulmonary artery hypertension) (HCC)  Interstitial pulmonary disease (HCC)  Centrilobular emphysema (HCC)  Chronic respiratory failure with hypoxia (HCC)  Discussion: Zykerria has mild centrilobular emphysema, though she did not benefit from Spiriva.  I am a bit confused because initially she told me that she felt quite good after using it and then decided later that she did not.  I still think there is benefit in trying a long-acting bronchodilator so I am going to give her a prescription for you pill re-to use nebulized.  Given her emphysema (which does not seem to have worsened radiographically) there may be value in changing her prostacyclin to an inhaled form if her shortness of breath worsens.  Plan: Centrilobular emphysema: Take Yupelri 1 once daily no matter how you feel, we will give you a sample today.  Call me if you think this is helpful Continue using DuoNeb as needed for shortness of breath Flu shot today You need to have a Pneumovax, not Prevnar  Chronic respiratory failure with hypoxemia: Keep using 3 to 4 L of oxygen continuously  Pulmonary hypertension: Continue taking the medicines as directed by the advanced heart failure clinic If your shortness of breath worsens you may benefit from an inhaled prostacyclin instead of the pill form  We will see you back in 3 months or sooner if needed   Current Outpatient Medications:  .  aspirin EC 81 MG tablet, Take 1 tablet (81 mg total) by mouth daily., Disp: 90 tablet, Rfl: 3 .  citalopram (CELEXA) 40 MG tablet, TAKE 1 TABLET EVERY DAY, Disp: 90 tablet, Rfl: 0 .  fluticasone (FLONASE) 50 MCG/ACT nasal spray, Place 1 spray into both nostrils daily., Disp: 48 g, Rfl: 3 .   HYDROcodone-acetaminophen (NORCO) 10-325 MG tablet, Take 1-2 tablets by mouth every 6 (six) hours as needed., Disp: 240 tablet, Rfl: 0 .  HYDROcodone-acetaminophen (NORCO) 10-325 MG tablet, Take 1-2 tablets by mouth every 6 (six) hours as needed., Disp: 240 tablet, Rfl: 0 .  HYDROcodone-acetaminophen (NORCO) 10-325 MG tablet, Take 1-2 tablets by mouth every 6 (six) hours as needed., Disp: 240 tablet, Rfl: 0 .  HYDROcodone-homatropine (HYCODAN) 5-1.5 MG/5ML syrup, Take 5-10 mLs by mouth every 6 (six) hours as needed., Disp: 240 mL, Rfl: 0 .  ipratropium-albuterol (DUONEB) 0.5-2.5 (3) MG/3ML SOLN, INHALE THE CONTENTS OF 1 VIAL VIA NEBULIZER FOUR TIMES DAILY AS NEEDED, Disp: 360 mL, Rfl: 4 .  Macitentan (OPSUMIT) 10 MG TABS, Take 1 tablet (10 mg total) by mouth daily., Disp: 90 tablet, Rfl: 3 .  OXYGEN, Take 2.5-4 L by mouth See admin instructions. 2.5 L with resting state &  4 L with exertion [facility], Disp: , Rfl:  .  Polyethyl Glycol-Propyl Glycol (LUBRICANT EYE DROPS) 0.4-0.3 % SOLN, Place 1 drop into both eyes daily., Disp: , Rfl:  .  potassium chloride SA (KLOR-CON M20) 20 MEQ tablet, TAKE 1 TABLET (20 MEQ TOTAL) BY MOUTH 2 (TWO) TIMES DAILY., Disp: 180 tablet, Rfl: 0 .  pravastatin (PRAVACHOL) 20 MG tablet, Take 1 tablet (20 mg total) by mouth daily., Disp: 90 tablet, Rfl: 3 .  Riociguat (ADEMPAS) 2 MG TABS, Take 2 mg by mouth 3 (three) times daily., Disp: 90 tablet, Rfl: 11 .  Selexipag 200 MCG TABS, Take 1 tablet (200 mcg total) by mouth 2 (two) times daily., Disp: 60 tablet, Rfl: 6 .  torsemide (DEMADEX) 20 MG tablet, Take 2 tablets (40 mg total) by mouth 2 (two) times daily., Disp: 120 tablet, Rfl: 1 .  Vitamin D, Ergocalciferol, (DRISDOL) 50000 units CAPS capsule, Take 1 capsule (50,000 Units total) by mouth every Monday., Disp: 12 capsule, Rfl: 3 .  zolpidem (AMBIEN) 5 MG tablet, Take 1 tablet (5 mg total) by mouth at bedtime., Disp: 30 tablet, Rfl: 5 .  Revefenacin (YUPELRI) 175 MCG/3ML  SOLN, Inhale 175 mcg into the lungs daily., Disp: 21 mL, Rfl: 0 .  Tiotropium Bromide Monohydrate (SPIRIVA RESPIMAT) 2.5 MCG/ACT AERS, Inhale 2 puffs into the lungs daily. (Patient not taking: Reported on 11/12/2018), Disp: 4 g, Rfl: 5

## 2018-11-12 NOTE — Patient Instructions (Signed)
Centrilobular emphysema: Take Yupelri 1 once daily no matter how you feel, we will give you a sample today.  Call me if you think this is helpful Continue using DuoNeb as needed for shortness of breath Flu shot today You need to have a Pneumovax, not Prevnar  Chronic respiratory failure with hypoxemia: Keep using 3 to 4 L of oxygen continuously  Pulmonary hypertension: Continue taking the medicines as directed by the advanced heart failure clinic If your shortness of breath worsens you may benefit from an inhaled prostacyclin instead of the pill form  We will see you back in 3 months or sooner if needed

## 2018-11-13 ENCOUNTER — Encounter: Payer: Self-pay | Admitting: Physician Assistant

## 2018-11-13 ENCOUNTER — Ambulatory Visit (INDEPENDENT_AMBULATORY_CARE_PROVIDER_SITE_OTHER): Payer: Medicare PPO | Admitting: Physician Assistant

## 2018-11-13 DIAGNOSIS — F330 Major depressive disorder, recurrent, mild: Secondary | ICD-10-CM

## 2018-11-13 DIAGNOSIS — M5136 Other intervertebral disc degeneration, lumbar region: Secondary | ICD-10-CM

## 2018-11-13 DIAGNOSIS — J40 Bronchitis, not specified as acute or chronic: Secondary | ICD-10-CM

## 2018-11-13 DIAGNOSIS — M51369 Other intervertebral disc degeneration, lumbar region without mention of lumbar back pain or lower extremity pain: Secondary | ICD-10-CM

## 2018-11-13 MED ORDER — HYDROCODONE-ACETAMINOPHEN 10-325 MG PO TABS: 1.0000 | ORAL_TABLET | Freq: Four times a day (QID) | ORAL | 0 refills | Status: DC | PRN

## 2018-11-13 MED ORDER — CEFDINIR 300 MG PO CAPS: 300.0000 mg | ORAL_CAPSULE | Freq: Two times a day (BID) | ORAL | 0 refills | Status: DC

## 2018-11-13 MED ORDER — HYDROCODONE-HOMATROPINE 5-1.5 MG/5ML PO SYRP: 5.0000 mL | ORAL_SOLUTION | Freq: Four times a day (QID) | ORAL | 0 refills | Status: DC | PRN

## 2018-11-13 MED ORDER — CITALOPRAM HYDROBROMIDE 40 MG PO TABS: 40.0000 mg | ORAL_TABLET | Freq: Every day | ORAL | 3 refills | Status: DC

## 2018-11-15 NOTE — Progress Notes (Signed)
BP (!) 87/59   Pulse 70   Temp 97.9 F (36.6 C) (Oral)   Ht 5' 7.24" (1.708 m)   Wt 207 lb 3.2 oz (94 kg)   BMI 32.22 kg/m    Subjective:    Patient ID: Kelly Donaldson, female    DOB: Jul 27, 1956, 62 y.o.   MRN: [id number]  HPI: Kelly Donaldson is a 62 y.o. female presenting on 11/13/2018 for Hyperlipidemia (3 month )  This patient comes in for periodic recheck on medications and conditions including DDD with chronic pain, recurrent bronchitis, depression.   All medications are reviewed today. There are no reports of any problems with the medications. All of the medical conditions are reviewed and updated.  Lab work is reviewed and will be ordered as medically necessary. There are no new problems reported with today's visit.   Past Medical History:  Diagnosis Date  . Allergy   . Depression   . GERD (gastroesophageal reflux disease)   . Hyperlipidemia   . Oxygen dependent   . Shortness of breath dyspnea   . Varicose veins    Relevant past medical, surgical, family and social history reviewed and updated as indicated. Interim medical history since our last visit reviewed. Allergies and medications reviewed and updated. DATA REVIEWED: CHART IN EPIC  Family History reviewed for pertinent findings.  Review of Systems  Constitutional: Negative.  Negative for activity change, fatigue and fever.  HENT: Negative.   Eyes: Negative.   Respiratory: Negative.  Negative for cough.   Cardiovascular: Negative.  Negative for chest pain.  Gastrointestinal: Negative.  Negative for abdominal pain.  Endocrine: Negative.   Genitourinary: Negative.  Negative for dysuria.  Musculoskeletal: Negative.   Skin: Negative.   Neurological: Negative.     Allergies as of 11/13/2018      Reactions   Gabapentin Other (See Comments)   Pt states that she can not take with antidepressants   Latex Rash      Medication List        Accurate as of 11/13/18 11:59 PM. Always use your most recent med  list.          aspirin EC 81 MG tablet Take 1 tablet (81 mg total) by mouth daily.   cefdinir 300 MG capsule Commonly known as:  OMNICEF Take 1 capsule (300 mg total) by mouth 2 (two) times daily. 1 po BID   citalopram 40 MG tablet Commonly known as:  CELEXA Take 1 tablet (40 mg total) by mouth daily.   fluticasone 50 MCG/ACT nasal spray Commonly known as:  FLONASE Place 1 spray into both nostrils daily.   HYDROcodone-acetaminophen 10-325 MG tablet Commonly known as:  NORCO Take 1-2 tablets by mouth every 6 (six) hours as needed.   HYDROcodone-acetaminophen 10-325 MG tablet Commonly known as:  NORCO Take 1-2 tablets by mouth every 6 (six) hours as needed.   HYDROcodone-acetaminophen 10-325 MG tablet Commonly known as:  NORCO Take 1-2 tablets by mouth every 6 (six) hours as needed.   HYDROcodone-homatropine 5-1.5 MG/5ML syrup Commonly known as:  HYCODAN Take 5-10 mLs by mouth every 6 (six) hours as needed.   ipratropium-albuterol 0.5-2.5 (3) MG/3ML Soln Commonly known as:  DUONEB INHALE THE CONTENTS OF 1 VIAL VIA NEBULIZER FOUR TIMES DAILY AS NEEDED   LUBRICANT EYE DROPS 0.4-0.3 % Soln Generic drug:  Polyethyl Glycol-Propyl Glycol Place 1 drop into both eyes daily.   macitentan 10 MG tablet Commonly known as:  OPSUMIT Take 1 tablet (10  mg total) by mouth daily.   OXYGEN Take 2.5-4 L by mouth See admin instructions. 2.5 L with resting state & 4 L with exertion [facility]   potassium chloride SA 20 MEQ tablet Commonly known as:  K-DUR,KLOR-CON TAKE 1 TABLET (20 MEQ TOTAL) BY MOUTH 2 (TWO) TIMES DAILY.   pravastatin 20 MG tablet Commonly known as:  PRAVACHOL Take 1 tablet (20 mg total) by mouth daily.   Revefenacin 175 MCG/3ML Soln Inhale 175 mcg into the lungs daily.   Riociguat 2 MG Tabs Take 2 mg by mouth 3 (three) times daily.   Selexipag 200 MCG Tabs Take 1 tablet (200 mcg total) by mouth 2 (two) times daily.   torsemide 20 MG tablet Commonly  known as:  DEMADEX Take 2 tablets (40 mg total) by mouth 2 (two) times daily.   Vitamin D (Ergocalciferol) 1.25 MG (50000 UT) Caps capsule Commonly known as:  DRISDOL Take 1 capsule (50,000 Units total) by mouth every Monday.   zolpidem 5 MG tablet Commonly known as:  AMBIEN Take 1 tablet (5 mg total) by mouth at bedtime.          Objective:    BP (!) 87/59   Pulse 70   Temp 97.9 F (36.6 C) (Oral)   Ht 5' 7.24" (1.708 m)   Wt 207 lb 3.2 oz (94 kg)   BMI 32.22 kg/m   Allergies  Allergen Reactions  . Gabapentin Other (See Comments)    Pt states that she can not take with antidepressants  . Latex Rash    Wt Readings from Last 3 Encounters:  11/13/18 207 lb 3.2 oz (94 kg)  11/12/18 209 lb 12.8 oz (95.2 kg)  08/19/18 217 lb (98.4 kg)    Physical Exam  Constitutional: She is oriented to person, place, and time. She appears well-developed and well-nourished.  HENT:  Head: Normocephalic and atraumatic.  Eyes: Pupils are equal, round, and reactive to light. Conjunctivae and EOM are normal.  Cardiovascular: Normal rate, regular rhythm, normal heart sounds and intact distal pulses.  Pulmonary/Chest: Effort normal and breath sounds normal.  Abdominal: Soft. Bowel sounds are normal.  Neurological: She is alert and oriented to person, place, and time. She has normal reflexes.  Skin: Skin is warm and dry. No rash noted.  Psychiatric: She has a normal mood and affect. Her behavior is normal. Judgment and thought content normal.    Results for orders placed or performed during the hospital encounter of 06/18/18  Basic metabolic panel  Result Value Ref Range   Sodium 140 135 - 145 mmol/L   Potassium 4.1 3.5 - 5.1 mmol/L   Chloride 101 98 - 111 mmol/L   CO2 31 22 - 32 mmol/L   Glucose, Bld 105 (H) 70 - 99 mg/dL   BUN 14 8 - 23 mg/dL   Creatinine, Ser 7.93 (H) 0.44 - 1.00 mg/dL   Calcium 9.3 8.9 - 90.3 mg/dL   GFR calc non Af Amer 45 (L) >60 mL/min   GFR calc Af Amer 52 (L)  >60 mL/min   Anion gap 8 5 - 15  Brain natriuretic peptide  Result Value Ref Range   B Natriuretic Peptide 33.8 0.0 - 100.0 pg/mL  Magnesium  Result Value Ref Range   Magnesium 2.2 1.7 - 2.4 mg/dL      Assessment & Plan:   1. DDD (degenerative disc disease), lumba - HYDROcodone-acetaminophen (NORCO) 10-325 MG tablet; Take 1-2 tablets by mouth every 6 (six) hours as needed.  Dispense: 240 tablet; Refill: 0 - HYDROcodone-acetaminophen (NORCO) 10-325 MG tablet; Take 1-2 tablets by mouth every 6 (six) hours as needed.  Dispense: 240 tablet; Refill: 0 - HYDROcodone-acetaminophen (NORCO) 10-325 MG tablet; Take 1-2 tablets by mouth every 6 (six) hours as needed.  Dispense: 240 tablet; Refill: 0  2. Bronchitis - HYDROcodone-homatropine (HYCODAN) 5-1.5 MG/5ML syrup; Take 5-10 mLs by mouth every 6 (six) hours as needed.  Dispense: 240 mL; Refill: 0 - cefdinir (OMNICEF) 300 MG capsule; Take 1 capsule (300 mg total) by mouth 2 (two) times daily. 1 po BID  Dispense: 20 capsule; Refill: 0  3. Mild episode of recurrent major depressive disorder (HCC) - citalopram (CELEXA) 40 MG tablet; Take 1 tablet (40 mg total) by mouth daily.  Dispense: 90 tablet; Refill: 3   Continue all other maintenance medications as listed above.  Follow up plan: Return in about 3 months (around 02/12/2019).  Educational handout given for survey  Remus Loffler PA-C [facility] [address]  [place], [place] [address] [phone]   11/15/2018, 10:37 PM

## 2018-11-16 DIAGNOSIS — J449 Chronic obstructive pulmonary disease, unspecified: Secondary | ICD-10-CM | POA: Diagnosis not present

## 2018-11-24 ENCOUNTER — Ambulatory Visit (HOSPITAL_COMMUNITY)
Admission: RE | Admit: 2018-11-24 | Discharge: 2018-11-24 | Disposition: A | Discharge status: Home / Self Care | Payer: Medicare PPO | Source: Ambulatory Visit | Attending: Cardiology | Admitting: Cardiology

## 2018-11-24 ENCOUNTER — Encounter (HOSPITAL_COMMUNITY): Payer: Self-pay

## 2018-11-24 VITALS — BP 104/60 | HR 72 | Wt 208.4 lb

## 2018-11-24 DIAGNOSIS — J9611 Chronic respiratory failure with hypoxia: Secondary | ICD-10-CM | POA: Insufficient documentation

## 2018-11-24 DIAGNOSIS — Z7982 Long term (current) use of aspirin: Secondary | ICD-10-CM | POA: Diagnosis not present

## 2018-11-24 DIAGNOSIS — Z79899 Other long term (current) drug therapy: Secondary | ICD-10-CM | POA: Insufficient documentation

## 2018-11-24 DIAGNOSIS — Z87891 Personal history of nicotine dependence: Secondary | ICD-10-CM | POA: Insufficient documentation

## 2018-11-24 DIAGNOSIS — J449 Chronic obstructive pulmonary disease, unspecified: Secondary | ICD-10-CM | POA: Diagnosis not present

## 2018-11-24 DIAGNOSIS — I2721 Secondary pulmonary arterial hypertension: Secondary | ICD-10-CM

## 2018-11-24 DIAGNOSIS — D869 Sarcoidosis, unspecified: Secondary | ICD-10-CM

## 2018-11-24 DIAGNOSIS — E785 Hyperlipidemia, unspecified: Secondary | ICD-10-CM | POA: Diagnosis not present

## 2018-11-24 DIAGNOSIS — K219 Gastro-esophageal reflux disease without esophagitis: Secondary | ICD-10-CM | POA: Diagnosis not present

## 2018-11-24 DIAGNOSIS — J849 Interstitial pulmonary disease, unspecified: Secondary | ICD-10-CM | POA: Insufficient documentation

## 2018-11-24 DIAGNOSIS — R102 Pelvic and perineal pain: Secondary | ICD-10-CM | POA: Insufficient documentation

## 2018-11-24 DIAGNOSIS — I5032 Chronic diastolic (congestive) heart failure: Secondary | ICD-10-CM

## 2018-11-24 DIAGNOSIS — I2729 Other secondary pulmonary hypertension: Secondary | ICD-10-CM

## 2018-11-24 DIAGNOSIS — Z79891 Long term (current) use of opiate analgesic: Secondary | ICD-10-CM | POA: Diagnosis not present

## 2018-11-24 DIAGNOSIS — I872 Venous insufficiency (chronic) (peripheral): Secondary | ICD-10-CM | POA: Insufficient documentation

## 2018-11-24 DIAGNOSIS — Z9981 Dependence on supplemental oxygen: Secondary | ICD-10-CM | POA: Diagnosis not present

## 2018-11-24 LAB — BASIC METABOLIC PANEL
Anion gap: 11 (ref 5–15)
BUN: 17 mg/dL (ref 8–23)
CO2: 26 mmol/L (ref 22–32)
Calcium: 8.9 mg/dL (ref 8.9–10.3)
Chloride: 100 mmol/L (ref 98–111)
Creatinine, Ser: 1.15 mg/dL — ABNORMAL HIGH (ref 0.44–1.00)
GFR calc Af Amer: 59 mL/min — ABNORMAL LOW (ref >60)
GFR, EST NON AFRICAN AMERICAN: 51 mL/min — AB (ref >60)
Glucose, Bld: 105 mg/dL — ABNORMAL HIGH (ref 70–99)
Potassium: 4.3 mmol/L (ref 3.5–5.1)
Sodium: 137 mmol/L (ref 135–145)

## 2018-11-24 MED ORDER — POTASSIUM CHLORIDE CRYS ER 20 MEQ PO TBCR: EXTENDED_RELEASE_TABLET | ORAL | 3 refills | Status: DC

## 2018-11-24 MED ORDER — TORSEMIDE 20 MG PO TABS: 40.0000 mg | ORAL_TABLET | Freq: Two times a day (BID) | ORAL | 3 refills | Status: DC

## 2018-11-24 MED ORDER — PRAVASTATIN SODIUM 20 MG PO TABS: 20.0000 mg | ORAL_TABLET | Freq: Every day | ORAL | 3 refills | Status: DC

## 2018-11-24 MED ORDER — ASPIRIN EC 81 MG PO TBEC: 81.0000 mg | DELAYED_RELEASE_TABLET | Freq: Every day | ORAL | 3 refills | Status: AC

## 2018-11-24 NOTE — Patient Instructions (Signed)
Labs today We will only contact you if something comes back abnormal or we need to make some changes. Otherwise no news is good news!    Your physician recommends that you schedule a follow-up appointment in: 3 months with Dr McLean   Do the following things EVERYDAY: 1) Weigh yourself in the morning before breakfast. Write it down and keep it in a log. 2) Take your medicines as prescribed 3) Eat low salt foods-Limit salt (sodium) to 2000 mg per day.  4) Stay as active as you can everyday 5) Limit all fluids for the day to less than 2 liters   

## 2018-11-24 NOTE — Progress Notes (Signed)
Patient ID: Kelly Donaldson, female   DOB: 1956/05/26, 62 y.o.   MRN: [id number]   PCP: Prudy Feeler Pulmonology: Dr [name] Primary HF: Dr Shirlee Latch   Kelly Donaldson is a 62 y.o. with history of chronic hypoxemic respiratory failure and pulmonary hypertension.  She has been followed by Dr [name], now follows with Dr. [name] with pulmonology.  Has been on home oxygen for several years now.  She quit smoking in 2016.  She had an echo done in 5/16 showing dilated RV with severe pulmonary hypertension.  She therefore had RHC in 5/16 which confirmed severe pulmonary hypertension and RV failure.  PFTs showed only mild obstruction and restriction with markedly decreased DLCO suggestive of pulmonary vascular disease.  There was concern for sarcoidosis on CT chest but lymph node biopsy showed no evidence for sarcoidosis.  V/Q scan showed no evidence for chronic PE.   She has not tolerated Revatio or Adcirca.  Have failed previous up-titration of Selexipag with pelvic pain, can tolerate 200 mcg bid.  She is now tolerating riociguat 2 mg tid.   Started torsemide in Oct. 2017, has had better diuresis.   Due to ongoing dyspnea, she had repeat RHC in 5/19 that showed normal filling pressures and well-controlled pulmonary hypertension.  Echo in 6/19 showed normal LV EF, normal RV systolic function, and mild RV dilation.   She saw Dr. [name] and was started on Spiriva.  Subsequently, her breathing seemed to improve.   She returns today for [facility] follow up. She is doing well. She can walk on flat ground with no SOB, but does take breaks. SOB with stairs. Denies orthopnea, PND, or dizziness. She was dizzy on Sunday after not eating and felt better after laying down. She is dizzy once every 2-3 months, typically when she does not eat. Continues to go to pulmonary rehab 2x/week. She declines today because of her shoes, but says she will do at pulmonary rehab today. Requested that she let us know. Wears 3-4 L O2. Taking all  medications. Appetite is poor. Weight down 10 lbs in 3 months.  6 minute walk (5/16): 238 meters => decreased oxygen saturation to 73%, increased to 6 L [place] 6 minute walk (7/16): 170 meters => unable to complete.  6 minute walk (9/16): 201 meters 6 minute walk (1/17): 256 meters 6 minute walk (4/17): 219 meters 6 minute walk (8/17): 225 meters 6 minute walk (11/17): 244 meters 6 minute walk (5/18): 229 meters 6 minute walk (2/19): 122 meters (but she was stopped halfway through with drop in oxygen saturation.   Labs (2/16): BNP 69 Labs (5/16): LFTs normal, HCT 34.9, RF negative, HIV negative, TSH negative Labs (6/16): K 4.4, creatinine 0.88 Labs (7/16): ANA negative, BNP 58, K 4.3, creatinine 1.1, anti-RNP negative Labs (9/16): K 4.4, creatinine 1.26, BNP 91 Labs (10/16): K 4.3, creatinine 1.04 Labs (12/16): K 4.2, creatinine 1.07, BNP 130 Labs (1/17): K 4.3, creatinine 1.23, BNP 42 Labs (3/17): K 4.2, creatinine 1.19 Labs (5/17): K 4.3, creatinine 1.35, BNP 44 Labs (6/17): K 4.4, creatinine 1.18, BNP 44 Labs (10/17): K 4.2, creatinine 0.98, BNP 43 Labs (12/17): K 4.5, creatinine 1.23. BNP 64.5 Labs (2/18): K 4.5, creatinine 1.21, BNP 64 Labs (4/18): K 4.1, creatinine 1.24, BNP 26 Labs (9/18): K 4.6, creatinine 1.25 Labs (2/19): K 4.2, creatinine 1.39 Labs (5/19): K 4, creatinine 1.4, hgb 8.4 . PMH: 1. Pulmonary hypertension: RHC (5/16) with mean RA 18, PA 83/33 mean 53, mean PCWP 19, CI  2.61, PVR 5.8 WU.   Echo (5/16) with EF 65-70%, septal flattening, dilated RV with PA systolic pressure 99 mmHg, +bubble study.  PFTs (4/16) with FVC 76%, FEV1 75%, ratio 97%, TLC 66%, DLCO 26% => mild restriction, mild obstruction, severe diffuse abnormality (pulmonary vascular problem).  CTA chest (4/16) with chronic interstitial and obstructive lung disease, small mediastinal and hilar lymph noes, no PE.  Lymph node biopsy negative for sarcoidosis (reactive changes).  V/Q scan (6/16): No acute or  chronic PE. TEE (6/16): Normal LV size and systolic function, EF 55-60%,RV was mildly dilated with mildly decreased systolic function, D-shaped interventricular septum suggestive of RV pressure/volume overload, there appeared to be a small PFO present with some bubbles crossing, no ASD.  ANA negative, anti-RNP negative, HIV negative. Sleep study (8/16) without OSA.  She did not tolerate Adcirca or Revatio.  She cannot tolerate more than 200 mcg bid Selexipag.  - Echo (6/17): EF 60-65%, normal diastolic function, D-shaped interventricular septum, mildly dilated RV with normal RV systolic function, PASP 39, IVC normal.  - Echo (6/18): EF 60-65%, normal diastolic function, D-shaped interventricular septum, mildly dilated RV with normal RV systolic function, PASP 65 mmHg.  - RHC (7/18): RA 13, PA 51/23 mean 36, PCWP 19, CI 4.74, PVR 1.6 WU - RHC (5/19): mean RA 8, PA 48/20 mean 30, mean PCWP 13, CI 5.06, PVR 1.58 WU.  - Echo (6/19): EF 60-65%, mild RV dilation with normal RV systolic function, PASP 60, possible PFO.  2. Chronic venous insufficiency. 3. Hyperlipidemia.  4. GERD 5. Chronic hypoxemic respiratory failure: Hypoxemia, on home oxygen.  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  - PFTs (7/19): FEV1 82%, FVC 80%, ratio 101%, TLC 73%, DLCO 23%.   SH: Married, quit smoking in 3/16.  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    FH: No pulmonary hypertension, no premature CAD, no sudden death.   Review of systems complete and found to be negative unless listed in HPI.   Current Outpatient Medications  Medication Sig Dispense Refill  . aspirin EC 81 MG tablet Take 1 tablet (81 mg total) by mouth daily. 90 tablet 3  . cefdinir (OMNICEF) 300 MG capsule Take 1 capsule (300 mg total) by mouth 2 (two) times daily. 1 po BID 20 capsule 0  . citalopram (CELEXA) 40 MG tablet Take 1 tablet (40 mg total) by mouth daily. 90 tablet 3  . fluticasone (FLONASE) 50 MCG/ACT  nasal spray Place 1 spray into both nostrils daily. 48 g 3  . HYDROcodone-acetaminophen (NORCO) 10-325 MG tablet Take 1-2 tablets by mouth every 6 (six) hours as needed. 240 tablet 0  . HYDROcodone-acetaminophen (NORCO) 10-325 MG tablet Take 1-2 tablets by mouth every 6 (six) hours as needed. 240 tablet 0  . HYDROcodone-acetaminophen (NORCO) 10-325 MG tablet Take 1-2 tablets by mouth every 6 (six) hours as needed. 240 tablet 0  . HYDROcodone-homatropine (HYCODAN) 5-1.5 MG/5ML syrup Take 5-10 mLs by mouth every 6 (six) hours as needed. 240 mL 0  . ipratropium-albuterol (DUONEB) 0.5-2.5 (3) MG/3ML SOLN INHALE THE CONTENTS OF 1 VIAL VIA NEBULIZER FOUR TIMES DAILY AS NEEDED 360 mL 4  . Macitentan (OPSUMIT) 10 MG TABS Take 1 tablet (10 mg total) by mouth daily. 90 tablet 3  . OXYGEN Take 2.5-4 L by mouth See admin instructions. 2.5 L with resting state & 4 L with exertion [facility]    . Polyethyl Glycol-Propyl Glycol (LUBRICANT EYE DROPS) 0.4-0.3 % SOLN Place  1 drop into both eyes daily.    . potassium chloride SA (KLOR-CON M20) 20 MEQ tablet TAKE 1 TABLET (20 MEQ TOTAL) BY MOUTH 2 (TWO) TIMES DAILY. 180 tablet 0  . pravastatin (PRAVACHOL) 20 MG tablet Take 1 tablet (20 mg total) by mouth daily. 90 tablet 3  . Revefenacin (YUPELRI) 175 MCG/3ML SOLN Inhale 175 mcg into the lungs daily. 21 mL 0  . Riociguat (ADEMPAS) 2 MG TABS Take 2 mg by mouth 3 (three) times daily. 90 tablet 11  . Selexipag 200 MCG TABS Take 1 tablet (200 mcg total) by mouth 2 (two) times daily. 60 tablet 6  . torsemide (DEMADEX) 20 MG tablet Take 2 tablets (40 mg total) by mouth 2 (two) times daily. 120 tablet 1  . Vitamin D, Ergocalciferol, (DRISDOL) 50000 units CAPS capsule Take 1 capsule (50,000 Units total) by mouth every Monday. 12 capsule 3  . zolpidem (AMBIEN) 5 MG tablet Take 1 tablet (5 mg total) by mouth at bedtime. 30 tablet 5   No current facility-administered medications for this encounter.    BP 104/60   Pulse 72    Wt 94.5 kg (208 lb 6.4 oz)   SpO2 90% Comment: on2L  BMI 32.41 kg/m   Wt Readings from Last 3 Encounters:  11/24/18 94.5 kg (208 lb 6.4 oz)  11/13/18 94 kg (207 lb 3.2 oz)  11/12/18 95.2 kg (209 lb 12.8 oz)    General: No resp difficulty. HEENT: Normal Neck: Supple. JVP ~8. Carotids 2+ bilat; no bruits. No thyromegaly or nodule noted. Cor: PMI nondisplaced. RRR, No M/G/R noted Lungs: CTAB, normal effort. On 3 L O2 Abdomen: Soft, non-tender, non-distended, no HSM. No bruits or masses. +BS  Extremities: No cyanosis, clubbing, or rash. R and LLE no edema.  Neuro: Alert & orientedx3, cranial nerves grossly intact. moves all 4 extremities w/o difficulty. Affect pleasant   Assessment/Plan:  1. Pulmonary arterial hypertension with RV failure: Severe on 5/16 RHC. 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  There was concern from CTA chest for sarcoidosis, but lymph node biopsy was negative.  LFTs normal, ANA negative, anti-RNP negative, HIV negative.  V/Q scan negative for chronic PE.  Small PFO but no ASD on TEE.  No OSA on sleep study.  She did not tolerate Revatio due to joint pain (now resolved), and she did not tolerate Adcirca with worsening dyspnea.  She does not tolerate more than 200 mcg bid Selexipag due to pelvic pain.  Most recent echo in 6/19 showed normal LV EF, normal RV systolic function with mild RV dilation, and PASP 60.  RHCs in 7/18 and 5/19 have shown well-compensated pulmonary hypertension.  PVR was 1.58 WU on 5/19 study. She is improved symptomatically. Dr Shirlee Latch thinks that her residual symptoms are due to COPD + ILD rather than pulmonary hypertension.  - She declines 6 minute walk today because of the shoes she's wearing. Requested that she do at pulmonary rehab and let us know how she does.  - Open lung biopsy deemed too risky, probably not sarcoid per pulmonary evaluation.    -  Continue opsumit and riociguat 2 mg twice a day (unable to go higher due to dizziness).   - She has tolerated selexipag 200 mcg bid but has not been able to increase.  2. Chronic hypoxemic respiratory failure: Chronic interstitial lung disease + emphysema.  Concern for sarcoidosis but biopsy did not  show sarcoid.  Deemed too high risk for open lung biopsy.  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 - Follows with Dr [name] - On 3 L O2 at rest, 4 L with activity.  3. Chronic diastolic CHF: Echo 05/2018: EF 28-41%, RV mildly dilated, normal function, PA peak pressure 66 mm Hg. NYHA III. Volume stable on exam. - Continue torsemide 40 mg BID. BMET today 4. Poor intake - Discussed trying to eat at least 3 small meals daily. Offered referral to dietician, but she declined. Suggested trying Ensure to replace a meal.  BMET Follow up in 3 months with Dr Shirlee Latch Provided refills on HF and PAH medications.   Alford Highland, NP  11/24/2018   Greater than 50% of the 25 minute visit was spent in counseling/coordination of care regarding disease state education, salt/fluid restriction, sliding scale diuretics, and medication compliance.

## 2018-11-25 ENCOUNTER — Telehealth: Payer: Self-pay | Admitting: Pulmonary Disease

## 2018-11-25 MED ORDER — REVEFENACIN 175 MCG/3ML IN SOLN: 1.0000 | Freq: Every day | RESPIRATORY_TRACT | 3 refills | Status: DC

## 2018-11-25 NOTE — Telephone Encounter (Signed)
Refill sent and pt called lmom to let her know nothing further needed at this time.

## 2018-12-17 DIAGNOSIS — J449 Chronic obstructive pulmonary disease, unspecified: Secondary | ICD-10-CM | POA: Diagnosis not present

## 2018-12-22 ENCOUNTER — Telehealth (HOSPITAL_COMMUNITY): Payer: Self-pay

## 2018-12-22 NOTE — Telephone Encounter (Signed)
PA sent to [facility] for Selexipag BID

## 2018-12-23 ENCOUNTER — Telehealth (HOSPITAL_COMMUNITY): Payer: Self-pay

## 2018-12-23 NOTE — Telephone Encounter (Signed)
Prior authorization through [employer] was APPROVED for Uptravi 200mg  and will expire on 12/09/2019.

## 2019-01-14 ENCOUNTER — Other Ambulatory Visit: Payer: Self-pay | Admitting: Physician Assistant

## 2019-01-14 ENCOUNTER — Other Ambulatory Visit (HOSPITAL_COMMUNITY): Payer: Self-pay

## 2019-01-14 DIAGNOSIS — I270 Primary pulmonary hypertension: Secondary | ICD-10-CM

## 2019-01-14 DIAGNOSIS — F330 Major depressive disorder, recurrent, mild: Secondary | ICD-10-CM

## 2019-01-14 MED ORDER — MACITENTAN 10 MG PO TABS: 10.0000 mg | ORAL_TABLET | Freq: Every day | ORAL | 3 refills | Status: DC

## 2019-01-17 DIAGNOSIS — J449 Chronic obstructive pulmonary disease, unspecified: Secondary | ICD-10-CM | POA: Diagnosis not present

## 2019-01-20 ENCOUNTER — Telehealth (HOSPITAL_COMMUNITY): Payer: Self-pay

## 2019-01-20 NOTE — Telephone Encounter (Signed)
PA for Adempas request form faxed to [facility]

## 2019-01-20 NOTE — Telephone Encounter (Signed)
Prior authorization through [employer] was APPROVED for Adempas and will expire on 12/09/2019.

## 2019-02-02 DIAGNOSIS — Z5321 Procedure and treatment not carried out due to patient leaving prior to being seen by health care provider: Secondary | ICD-10-CM | POA: Diagnosis not present

## 2019-02-02 DIAGNOSIS — R079 Chest pain, unspecified: Secondary | ICD-10-CM | POA: Diagnosis not present

## 2019-02-03 ENCOUNTER — Other Ambulatory Visit (HOSPITAL_COMMUNITY): Payer: Self-pay

## 2019-02-03 ENCOUNTER — Telehealth (HOSPITAL_COMMUNITY): Payer: Self-pay | Admitting: Cardiology

## 2019-02-03 DIAGNOSIS — I270 Primary pulmonary hypertension: Secondary | ICD-10-CM

## 2019-02-03 NOTE — Telephone Encounter (Signed)
error 

## 2019-02-03 NOTE — Telephone Encounter (Signed)
Spoke with patient, she is aware DM is out of the office on 02/24/2019 and we need to reschedule the appt.  Patient stated she will have to check her schedule and call us back to reschedule.

## 2019-02-08 ENCOUNTER — Telehealth (HOSPITAL_COMMUNITY): Payer: Self-pay

## 2019-02-08 NOTE — Telephone Encounter (Signed)
Adempas 30 day titration faxed to [facility] with DM signature, confirmation fax received

## 2019-02-09 NOTE — Telephone Encounter (Signed)
Spoke with patient and she is aware of rescheduled appt on 02/26/19 with [name]

## 2019-02-10 ENCOUNTER — Other Ambulatory Visit: Payer: Self-pay | Admitting: Physician Assistant

## 2019-02-10 NOTE — Telephone Encounter (Signed)
Last seen 11/13/18

## 2019-02-11 ENCOUNTER — Encounter: Payer: Self-pay | Admitting: Pulmonary Disease

## 2019-02-11 ENCOUNTER — Ambulatory Visit: Payer: Medicare PPO | Admitting: Pulmonary Disease

## 2019-02-11 VITALS — BP 132/72 | HR 80 | Ht 67.0 in | Wt 202.7 lb

## 2019-02-11 DIAGNOSIS — J9611 Chronic respiratory failure with hypoxia: Secondary | ICD-10-CM

## 2019-02-11 DIAGNOSIS — J849 Interstitial pulmonary disease, unspecified: Secondary | ICD-10-CM

## 2019-02-11 DIAGNOSIS — J432 Centrilobular emphysema: Secondary | ICD-10-CM

## 2019-02-11 MED ORDER — UMECLIDINIUM-VILANTEROL 62.5-25 MCG/INH IN AEPB: 1.0000 | INHALATION_SPRAY | Freq: Every day | RESPIRATORY_TRACT | 0 refills | Status: DC

## 2019-02-11 MED ORDER — UMECLIDINIUM-VILANTEROL 62.5-25 MCG/INH IN AEPB: 1.0000 | INHALATION_SPRAY | Freq: Every day | RESPIRATORY_TRACT | 3 refills | Status: DC

## 2019-02-11 NOTE — Progress Notes (Signed)
Subjective:    Patient ID: Kelly Donaldson, female    DOB: 12-16-1955, 63 y.o.   MRN: [id number]  Synopsis: First seen by [facility] in 2017 for evaluation of dyspnea in the setting of WHO group 3, and group 2 pulmonary hypertension. Has a nondescript interstitial lung disease with significant centrilobular emphysema.  Smoked from age [age] 1ppd with one 10 year break   HPI  Chief Complaint  Patient presents with  . Follow-up    follow for ILD and PAH   Kelly Donaldson says that she couldn't afford the medicine we prescribed so she has not been taking any inhaled medicines right now.  She says that she continues to use and benefit from her oxygen, her weight is decreasing which she attributes to a lack of p.o. intake.  She has not had cough, bronchitis or pneumonia.  She intentionally tries to keep her self away from sick people.  Past Medical History:  Diagnosis Date  . Allergy   . Depression   . GERD (gastroesophageal reflux disease)   . Hyperlipidemia   . Oxygen dependent   . Shortness of breath dyspnea   . Varicose veins      Review of Systems  Constitutional: Negative for chills, fatigue and fever.  HENT: Negative for postnasal drip, rhinorrhea and sinus pressure.   Respiratory: Positive for shortness of breath. Negative for cough and wheezing.        Objective:   Physical Exam  Vitals:   02/11/19 1156  BP: 132/72  Pulse: 80  SpO2: 95%  Weight: 202 lb 11.2 oz (91.9 kg)  Height: 5\' 7"  (1.702 m)   3L [place]   Gen: chronically ill appearing HENT: OP clear, TM's clear, neck supple PULM: CTA B, normal percussion CV: RRR, no mgr, trace edema GI: BS+, soft, nontender Derm: no cyanosis or rash Psyche: normal mood and affect   Echo: Bubble echo 04/13/15 with R to L shunt   RHC: 04/16/15 Hemodynamics RA mean 18 RV 81/18 PA 83/33, mean 53 PCWP mean 19 Oxygen saturations: PA 63% AO 98% Cardiac Output (Fick) 5.82  Cardiac Index (Fick) 2.61 PVR 5.8 WU Rec:  Lasix/  masitentan   Right heart catheterization from May 2019 mean PA pressure 30, pulmonary capillary wedge pressure 13, cardiac index 5.1, PVR 1.6  Pulmonary function testing: 06/2018 Ratio 80% Forced vital capacity 2.4 L 80% predicted, total lung capacity 4.1 L 73% predicted, DLCO 6.52 mL 23% predict  Imaging: CT chest images April 2016  showing a fibrotic process in the upper lobes extending into the periphery of the lower lobes, certainly worse in the upper lobes and the lower lobes are appears to be groundglass in the upper lobes. There is no pulmonary embolism 08/2016 high-resolution CT scan of the chest: Kelly Donaldson review: Significant upper lobe centrilobular emphysema .  Peripheral based interlobular septal thickening and groundglass consistent with fibrosis with some mild bronchiectasis, fibrotic changes appear to be more prominent in the lower lobes with upper lobe nodules, no air trapping noted on expiratory images, mediastinal lymphadenopathy stable per radiology July 2019 CT chest: Images independently reviewed showing upper lobe predominant mild centrilobular emphysema, some nonspecific interlobular septal thickening and change in the bases.  6 minute walk test 6 minute walk (7/16): 170 meters => unable to complete.  6 minute walk (9/16): 201 meters 6 minute walk (1/17): 256 meters 6 minute walk (4/17): 219 meters 6 minute walk (8/17): 225 meters 6 minute walk (11/17): 244 meters 11/26/2016 walk 255 m  on 4 L nasal cannula. O2 saturation dropped transiently to 86%. O2 saturation improved with 5 L nasal cannula to 95% while exerting herself.   CBC    Component Value Date/Time   WBC 7.0 05/06/2018 0948   RBC 4.02 05/06/2018 0948   HGB 8.4 (L) 05/06/2018 0948   HCT 29.1 (L) 05/06/2018 0948   PLT 191 05/06/2018 0948   MCV 72.4 (L) 05/06/2018 0948   MCH 20.9 (L) 05/06/2018 0948   MCHC 28.9 (L) 05/06/2018 0948   RDW 17.4 (H) 05/06/2018 0948   LYMPHSABS 2.4 03/30/2015 1610   MONOABS 0.4  03/30/2015 1610   EOSABS 0.2 03/30/2015 1610   BASOSABS 0.0 03/30/2015 1610        Assessment & Plan:   No diagnosis found.  Discussion: We really need to get Kelly Donaldson on some form of bronchodilator that she can afford that she will continue to take as a significant portion of her disease is emphysema.  Plan: Centrilobular emphysema: Start taking Anoro 1 puff daily no matter how you feel Keep using albuterol as needed for chest tightness wheezing or shortness of breath Practice good hand hygiene Stay active  Chronic respiratory failure with hypoxemia: Keep using 4 to 6 L of oxygen continuously as you are doing  Pulmonary hypertension: Continue taking the pulmonary vasodilators as directed by the advanced heart failure clinic Keep sodium intake less than 2 g a day Weigh daily Continue taking torsemide  We will see you back in 3 to 6 months or sooner if needed    Current Outpatient Medications:  .  aspirin EC 81 MG tablet, Take 1 tablet (81 mg total) by mouth daily., Disp: 90 tablet, Rfl: 3 .  citalopram (CELEXA) 40 MG tablet, TAKE 1 TABLET EVERY DAY, Disp: 90 tablet, Rfl: 0 .  fluticasone (FLONASE) 50 MCG/ACT nasal spray, Place 1 spray into both nostrils daily., Disp: 48 g, Rfl: 3 .  HYDROcodone-acetaminophen (NORCO) 10-325 MG tablet, Take 1-2 tablets by mouth every 6 (six) hours as needed., Disp: 240 tablet, Rfl: 0 .  HYDROcodone-acetaminophen (NORCO) 10-325 MG tablet, Take 1-2 tablets by mouth every 6 (six) hours as needed., Disp: 240 tablet, Rfl: 0 .  HYDROcodone-acetaminophen (NORCO) 10-325 MG tablet, Take 1-2 tablets by mouth every 6 (six) hours as needed., Disp: 240 tablet, Rfl: 0 .  HYDROcodone-homatropine (HYCODAN) 5-1.5 MG/5ML syrup, Take 5-10 mLs by mouth every 6 (six) hours as needed., Disp: 240 mL, Rfl: 0 .  ipratropium-albuterol (DUONEB) 0.5-2.5 (3) MG/3ML SOLN, INHALE THE CONTENTS OF 1 VIAL VIA NEBULIZER FOUR TIMES DAILY AS NEEDED, Disp: 360 mL, Rfl: 4 .   macitentan (OPSUMIT) 10 MG tablet, Take 1 tablet (10 mg total) by mouth daily., Disp: 90 tablet, Rfl: 3 .  OXYGEN, Take 2.5-4 L by mouth See admin instructions. 2.5 L with resting state & 4 L with exertion [facility], Disp: , Rfl:  .  Polyethyl Glycol-Propyl Glycol (LUBRICANT EYE DROPS) 0.4-0.3 % SOLN, Place 1 drop into both eyes daily., Disp: , Rfl:  .  potassium chloride SA (KLOR-CON M20) 20 MEQ tablet, TAKE 1 TABLET (20 MEQ TOTAL) BY MOUTH 2 (TWO) TIMES DAILY., Disp: 180 tablet, Rfl: 3 .  pravastatin (PRAVACHOL) 20 MG tablet, Take 1 tablet (20 mg total) by mouth daily., Disp: 90 tablet, Rfl: 3 .  Riociguat (ADEMPAS) 2 MG TABS, Take 2 mg by mouth 3 (three) times daily., Disp: 90 tablet, Rfl: 11 .  Selexipag 200 MCG TABS, Take 1 tablet (200 mcg total) by mouth 2 (  two) times daily., Disp: 60 tablet, Rfl: 6 .  torsemide (DEMADEX) 20 MG tablet, Take 2 tablets (40 mg total) by mouth 2 (two) times daily., Disp: 360 tablet, Rfl: 3 .  Vitamin D, Ergocalciferol, (DRISDOL) 50000 units CAPS capsule, Take 1 capsule (50,000 Units total) by mouth every Monday., Disp: 12 capsule, Rfl: 3 .  zolpidem (AMBIEN) 5 MG tablet, Take 1 tablet (5 mg total) by mouth at bedtime., Disp: 30 tablet, Rfl: 5 .  cefdinir (OMNICEF) 300 MG capsule, Take 1 capsule (300 mg total) by mouth 2 (two) times daily. 1 po BID (Patient not taking: Reported on 02/11/2019), Disp: 20 capsule, Rfl: 0 .  Revefenacin (YUPELRI) 175 MCG/3ML SOLN, Inhale 175 mcg into the lungs daily. (Patient not taking: Reported on 02/11/2019), Disp: 21 mL, Rfl: 0

## 2019-02-11 NOTE — Patient Instructions (Signed)
Centrilobular emphysema: Start taking Anoro 1 puff daily no matter how you feel Keep using albuterol as needed for chest tightness wheezing or shortness of breath Practice good hand hygiene Stay active  Chronic respiratory failure with hypoxemia: Keep using 4 to 6 L of oxygen continuously as you are doing  Pulmonary hypertension: Continue taking the pulmonary vasodilators as directed by the advanced heart failure clinic Keep sodium intake less than 2 g a day Weigh daily Continue taking torsemide  We will see you back in 3 to 6 months or sooner if needed

## 2019-02-12 ENCOUNTER — Ambulatory Visit (INDEPENDENT_AMBULATORY_CARE_PROVIDER_SITE_OTHER): Payer: Medicare PPO | Admitting: Physician Assistant

## 2019-02-12 ENCOUNTER — Encounter: Payer: Self-pay | Admitting: Physician Assistant

## 2019-02-12 VITALS — BP 95/49 | HR 82 | Temp 97.9°F | Ht 67.0 in | Wt 202.6 lb

## 2019-02-12 DIAGNOSIS — Z79891 Long term (current) use of opiate analgesic: Secondary | ICD-10-CM | POA: Diagnosis not present

## 2019-02-12 DIAGNOSIS — F330 Major depressive disorder, recurrent, mild: Secondary | ICD-10-CM

## 2019-02-12 DIAGNOSIS — M170 Bilateral primary osteoarthritis of knee: Secondary | ICD-10-CM

## 2019-02-12 DIAGNOSIS — M5136 Other intervertebral disc degeneration, lumbar region: Secondary | ICD-10-CM

## 2019-02-12 MED ORDER — NALOXONE HCL 4 MG/0.1ML NA LIQD: 1.0000 | Freq: Once | NASAL | 1 refills | Status: AC

## 2019-02-12 MED ORDER — HYDROCODONE-ACETAMINOPHEN 10-325 MG PO TABS: 1.0000 | ORAL_TABLET | Freq: Four times a day (QID) | ORAL | 0 refills | Status: DC | PRN

## 2019-02-12 MED ORDER — DULOXETINE HCL 30 MG PO CPEP: 30.0000 mg | ORAL_CAPSULE | Freq: Every day | ORAL | 5 refills | Status: DC

## 2019-02-12 NOTE — Patient Instructions (Signed)
Please take your Celexa half tablet daily for 1 week then start the Cymbalta 30 mg a day and then the second week go to 60 mg a day.  We have the option of going up to 120 mg/day.

## 2019-02-12 NOTE — Progress Notes (Signed)
BP (!) 95/49   Pulse 82   Temp 97.9 F (36.6 C) (Oral)   Ht _0  (1.702 m)   Wt 202 lb 9.6 oz (91.9 kg)   BMI 31.73 kg/m    Subjective:    Patient ID: Kelly Donaldson, female    DOB: 21-Dec-1955, 63 y.o.   MRN: [id number]  HPI: Kelly Donaldson is a 63 y.o. female presenting on 02/12/2019 for Hyperlipidemia (3 month ) and Pain  PAIN ASSESSMENT: Cause of pain- DDD and DJD.  Patient with disabling pulmonary hypertension with sarcoidosis. She is followed by pulmonology.  She also has chronic CHF.  She is limited in the type of inflammation and pain medications that she can take.  She is on SS Disability. Avoid NSAIDs and try to limit prednisone. Sensitive to gabapentin  Currently on hydrocodone/APAP 80 mg per day, lowering by one tablet daily over the next three months. Duloxetine will be started and titrated to 60 mg this month.    This patient returns for a 3 month recheck on narcotic use for the above named conditions   Pain on scale of 1-10- 8/10 Frequency- daily What increases pain- walking and standing for longer than an hour What makes pain Better- rest, medication, heat Effects on ADL - moderate Any change in general medical condition- no  Effectiveness of current meds- good Adverse reactions form pain meds-no PMP AWARE website reviewed: Yes Any suspicious activity on PMP Aware: No MME daily dose: 80 MME Contract on file Last UDS  02/12/2019  [facility] script sent or current  History of overdose or risk of abuse very distant history of alcohol, smoking and rare drugs as teen    Past Medical History:  Diagnosis Date  . Allergy   . Depression   . GERD (gastroesophageal reflux disease)   . Hyperlipidemia   . Oxygen dependent   . Shortness of breath dyspnea   . Varicose veins    Relevant past medical, surgical, family and social history reviewed and updated as indicated. Interim medical history since our last visit reviewed. Allergies and medications reviewed and  updated. DATA REVIEWED: CHART IN EPIC  Family History reviewed for pertinent findings.  Review of Systems  Constitutional: Negative.  Negative for activity change, fatigue and fever.  HENT: Negative.   Eyes: Negative.   Respiratory: Positive for shortness of breath. Negative for cough and wheezing.   Cardiovascular: Positive for leg swelling. Negative for chest pain and palpitations.  Gastrointestinal: Negative.  Negative for abdominal pain.  Endocrine: Negative.   Genitourinary: Negative.  Negative for dysuria.  Musculoskeletal: Positive for arthralgias, back pain, gait problem, joint swelling and myalgias.  Skin: Negative.     Allergies as of 02/12/2019      Reactions   Gabapentin Other (See Comments)   Pt states that she can not take with antidepressants   Latex Rash      Medication List       Accurate as of February 12, 2019 11:59 PM. Always use your most recent med list.        aspirin EC 81 MG tablet Take 1 tablet (81 mg total) by mouth daily.   DULoxetine 30 MG capsule Commonly known as:  CYMBALTA Take 1-2 capsules (30-60 mg total) by mouth daily.   fluticasone 50 MCG/ACT nasal spray Commonly known as:  FLONASE Place 1 spray into both nostrils daily.   HYDROcodone-acetaminophen 10-325 MG tablet Commonly known as:  NORCO Take 1-2 tablets by mouth every  6 (six) hours as needed.   HYDROcodone-acetaminophen 10-325 MG tablet Commonly known as:  NORCO Take 1-2 tablets by mouth every 6 (six) hours as needed.   HYDROcodone-acetaminophen 10-325 MG tablet Commonly known as:  NORCO Take 1-2 tablets by mouth every 6 (six) hours as needed.   HYDROcodone-homatropine 5-1.5 MG/5ML syrup Commonly known as:  HYCODAN Take 5-10 mLs by mouth every 6 (six) hours as needed.   ipratropium-albuterol 0.5-2.5 (3) MG/3ML Soln Commonly known as:  DUONEB INHALE THE CONTENTS OF 1 VIAL VIA NEBULIZER FOUR TIMES DAILY AS NEEDED   Lubricant Eye Drops 0.4-0.3 % Soln Generic drug:   Polyethyl Glycol-Propyl Glycol Place 1 drop into both eyes daily.   macitentan 10 MG tablet Commonly known as:  Opsumit Take 1 tablet (10 mg total) by mouth daily.   naloxone 4 MG/0.1ML Liqd nasal spray kit Commonly known as:  NARCAN Place 1 spray into the nose once for 1 dose.   OXYGEN Take 2.5-4 L by mouth See admin instructions. 2.5 L with resting state & 4 L with exertion [facility]   potassium chloride SA 20 MEQ tablet Commonly known as:  Klor-Con M20 TAKE 1 TABLET (20 MEQ TOTAL) BY MOUTH 2 (TWO) TIMES DAILY.   pravastatin 20 MG tablet Commonly known as:  PRAVACHOL Take 1 tablet (20 mg total) by mouth daily.   Revefenacin 175 MCG/3ML Soln Commonly known as:  Yupelri Inhale 175 mcg into the lungs daily.   Riociguat 2 MG Tabs Commonly known as:  Adempas Take 2 mg by mouth 3 (three) times daily.   Selexipag 200 MCG Tabs Take 1 tablet (200 mcg total) by mouth 2 (two) times daily.   torsemide 20 MG tablet Commonly known as:  DEMADEX Take 2 tablets (40 mg total) by mouth 2 (two) times daily.   umeclidinium-vilanterol 62.5-25 MCG/INH Aepb Commonly known as:  Anoro Ellipta Inhale 1 puff into the lungs daily.   Vitamin D (Ergocalciferol) 1.25 MG (50000 UT) Caps capsule Commonly known as:  DRISDOL Take 1 capsule (50,000 Units total) by mouth every Monday.   zolpidem 5 MG tablet Commonly known as:  AMBIEN Take 1 tablet (5 mg total) by mouth at bedtime.          Objective:    BP (!) 95/49   Pulse 82   Temp 97.9 F (36.6 C) (Oral)   Ht _0  (1.702 m)   Wt 202 lb 9.6 oz (91.9 kg)   BMI 31.73 kg/m   Allergies  Allergen Reactions  . Gabapentin Other (See Comments)    Pt states that she can not take with antidepressants  . Latex Rash    Wt Readings from Last 3 Encounters:  02/12/19 202 lb 9.6 oz (91.9 kg)  02/11/19 202 lb 11.2 oz (91.9 kg)  11/24/18 208 lb 6.4 oz (94.5 kg)    Physical Exam Constitutional:      Appearance: She is well-developed.    HENT:     Head: Normocephalic and atraumatic.     Right Ear: Tympanic membrane, ear canal and external ear normal.     Left Ear: Tympanic membrane, ear canal and external ear normal.     Nose: Nose normal. No rhinorrhea.     Mouth/Throat:     Pharynx: No oropharyngeal exudate or posterior oropharyngeal erythema.  Eyes:     Conjunctiva/sclera: Conjunctivae normal.     Pupils: Pupils are equal, round, and reactive to light.  Neck:     Musculoskeletal: Normal range of motion and  neck supple.  Cardiovascular:     Rate and Rhythm: Normal rate and regular rhythm.     Heart sounds: Normal heart sounds.  Pulmonary:     Effort: Pulmonary effort is normal.     Breath sounds: Normal breath sounds.  Abdominal:     General: Bowel sounds are normal.     Palpations: Abdomen is soft.  Skin:    General: Skin is warm and dry.     Findings: No rash.  Neurological:     Mental Status: She is alert and oriented to person, place, and time.     Deep Tendon Reflexes: Reflexes are normal and symmetric.  Psychiatric:        Behavior: Behavior normal.        Thought Content: Thought content normal.        Judgment: Judgment normal.     Results for orders placed or performed during the hospital encounter of 27/06/23  Basic metabolic panel  Result Value Ref Range   Sodium 137 135 - 145 mmol/L   Potassium 4.3 3.5 - 5.1 mmol/L   Chloride 100 98 - 111 mmol/L   CO2 26 22 - 32 mmol/L   Glucose, Bld 105 (H) 70 - 99 mg/dL   BUN 17 8 - 23 mg/dL   Creatinine, Ser 1.15 (H) 0.44 - 1.00 mg/dL   Calcium 8.9 8.9 - 10.3 mg/dL   GFR calc non Af Amer 51 (L) >60 mL/min   GFR calc Af Amer 59 (L) >60 mL/min   Anion gap 11 5 - 15      Assessment & Plan:   1. DDD (degenerative disc disease), lumbar - naloxone (NARCAN) nasal spray 4 mg/0.1 mL; Place 1 spray into the nose once for 1 dose.  Dispense: 1 kit; Refill: 1 - HYDROcodone-acetaminophen (NORCO) 10-325 MG tablet; Take 1-2 tablets by mouth every 6 (six) hours  as needed.  Dispense: 210 tablet; Refill: 0 - HYDROcodone-acetaminophen (NORCO) 10-325 MG tablet; Take 1-2 tablets by mouth every 6 (six) hours as needed.  Dispense: 210 tablet; Refill: 0 - HYDROcodone-acetaminophen (NORCO) 10-325 MG tablet; Take 1-2 tablets by mouth every 6 (six) hours as needed.  Dispense: 210 tablet; Refill: 0 - DULoxetine (CYMBALTA) 30 MG capsule; Take 1-2 capsules (30-60 mg total) by mouth daily.  Dispense: 60 capsule; Refill: 5  2. Mild episode of recurrent major depressive disorder ([place])  3. Primary osteoarthritis of both knees - naloxone (NARCAN) nasal spray 4 mg/0.1 mL; Place 1 spray into the nose once for 1 dose.  Dispense: 1 kit; Refill: 1 - ToxASSURE Select 13 (MW), Urine - HYDROcodone-acetaminophen (NORCO) 10-325 MG tablet; Take 1-2 tablets by mouth every 6 (six) hours as needed.  Dispense: 210 tablet; Refill: 0 - HYDROcodone-acetaminophen (NORCO) 10-325 MG tablet; Take 1-2 tablets by mouth every 6 (six) hours as needed.  Dispense: 210 tablet; Refill: 0 - HYDROcodone-acetaminophen (NORCO) 10-325 MG tablet; Take 1-2 tablets by mouth every 6 (six) hours as needed.  Dispense: 210 tablet; Refill: 0 - DULoxetine (CYMBALTA) 30 MG capsule; Take 1-2 capsules (30-60 mg total) by mouth daily.  Dispense: 60 capsule; Refill: 5   Continue all other maintenance medications as listed above.  Follow up plan: Return in about 3 months (around 05/15/2019).  Educational handout given for [place] PA-C [place] [address]  [place], [place] [address] [phone]   02/14/2019, 10:27 PM

## 2019-02-14 DIAGNOSIS — M170 Bilateral primary osteoarthritis of knee: Secondary | ICD-10-CM | POA: Insufficient documentation

## 2019-02-15 DIAGNOSIS — J449 Chronic obstructive pulmonary disease, unspecified: Secondary | ICD-10-CM | POA: Diagnosis not present

## 2019-02-15 NOTE — Progress Notes (Signed)
Has CKD. Not a good candidate for oral NSAIDs. Agree with taper off Norco.  Is she a candidate for injection therapies with PM&R or ortho?  Physical therapy and dry needling/ TENS unit might be beneficial.  Kelly M. Nadine Counts, DO [facility]

## 2019-02-18 LAB — TOXASSURE SELECT 13 (MW), URINE

## 2019-02-21 ENCOUNTER — Other Ambulatory Visit: Payer: Self-pay | Admitting: Physician Assistant

## 2019-02-22 NOTE — Telephone Encounter (Signed)
Last seen 02/12/19

## 2019-02-23 ENCOUNTER — Other Ambulatory Visit: Payer: Self-pay | Admitting: Physician Assistant

## 2019-02-23 DIAGNOSIS — J40 Bronchitis, not specified as acute or chronic: Secondary | ICD-10-CM

## 2019-02-24 ENCOUNTER — Encounter (HOSPITAL_COMMUNITY): Payer: Medicare PPO | Admitting: Cardiology

## 2019-02-26 ENCOUNTER — Encounter (HOSPITAL_COMMUNITY): Payer: Medicare PPO | Admitting: Cardiology

## 2019-02-26 ENCOUNTER — Other Ambulatory Visit: Payer: Self-pay | Admitting: Physician Assistant

## 2019-02-26 DIAGNOSIS — M5136 Other intervertebral disc degeneration, lumbar region: Secondary | ICD-10-CM

## 2019-02-26 DIAGNOSIS — M170 Bilateral primary osteoarthritis of knee: Secondary | ICD-10-CM

## 2019-02-26 NOTE — Progress Notes (Signed)
Spoke with patient concerning the results of her Toxassure. She Reports that she did not take any other person's medication.  Her daughter has oxycodone for headaches, she states that she put one of her daughter's pills in her bottle one day when they were away from the house.  She cannot say if she took it. She has 3 months of medications.  Will make an appointment with Pain Management to take over her care.

## 2019-03-04 ENCOUNTER — Other Ambulatory Visit: Payer: Self-pay | Admitting: Physician Assistant

## 2019-03-04 DIAGNOSIS — I5032 Chronic diastolic (congestive) heart failure: Secondary | ICD-10-CM

## 2019-03-08 ENCOUNTER — Other Ambulatory Visit (HOSPITAL_COMMUNITY): Payer: Self-pay

## 2019-03-08 ENCOUNTER — Other Ambulatory Visit: Payer: Self-pay | Admitting: *Deleted

## 2019-03-08 MED ORDER — SELEXIPAG 200 MCG PO TABS: 200.0000 ug | ORAL_TABLET | Freq: Two times a day (BID) | ORAL | 6 refills | Status: DC

## 2019-03-09 ENCOUNTER — Other Ambulatory Visit: Payer: Self-pay | Admitting: *Deleted

## 2019-03-09 NOTE — Telephone Encounter (Signed)
Fax from [facility] mail order pharmacy Request form Accu-Chek Aviva meter, test strips, lancets, solution & BD swabs TC to patient, she was feeling shaky one day, her daughter checked pt's BS with her meter it was low but she does not remember the number Please advise if ok to order as to directions and diagnosis

## 2019-03-09 NOTE — Telephone Encounter (Signed)
Having hypoglycemia does not really qualify for extensive diabetic supplies.  She is not going to need frequent monitoring.  If she is having hypoglycemia she needs to eat a more protein rich diet and eat every 3 hours.  Did she talk to [facility] pharmacy concerning this?  It is often hard enough for diabetics with elevated glucose to get supplies, so I am uncertain about a person having hypoglycemic events.  Please let me know what she says.

## 2019-03-10 MED ORDER — BD SWAB SINGLE USE REGULAR PADS: 1.0000 [IU] | MEDICATED_PAD | Freq: Two times a day (BID) | 0 refills | Status: DC

## 2019-03-10 MED ORDER — ZOLPIDEM TARTRATE 5 MG PO TABS: 5.0000 mg | ORAL_TABLET | Freq: Every day | ORAL | 4 refills | Status: DC

## 2019-03-10 MED ORDER — GLUCOSE BLOOD VI STRP: ORAL_STRIP | 12 refills | Status: DC

## 2019-03-10 MED ORDER — ACCU-CHEK SOFTCLIX LANCETS MISC: 12 refills | Status: DC

## 2019-03-10 MED ORDER — ACCU-CHEK AVIVA VI SOLN: 1.0000 [IU] | 0 refills | Status: DC

## 2019-03-10 MED ORDER — ACCU-CHEK AVIVA PLUS W/DEVICE KIT: 1.0000 [IU] | PACK | Freq: Two times a day (BID) | 0 refills | Status: DC

## 2019-03-10 NOTE — Telephone Encounter (Signed)
LM for pt to call back - so that we can discuss [facility] note. [id number]

## 2019-03-11 ENCOUNTER — Other Ambulatory Visit: Payer: Self-pay | Admitting: *Deleted

## 2019-03-11 ENCOUNTER — Telehealth (HOSPITAL_COMMUNITY): Payer: Self-pay

## 2019-03-11 NOTE — Telephone Encounter (Signed)
[name] dosage/titration instructions signed by DM faxed

## 2019-03-18 DIAGNOSIS — J449 Chronic obstructive pulmonary disease, unspecified: Secondary | ICD-10-CM | POA: Diagnosis not present

## 2019-03-19 ENCOUNTER — Other Ambulatory Visit (HOSPITAL_COMMUNITY): Payer: Self-pay | Admitting: Cardiology

## 2019-03-22 ENCOUNTER — Other Ambulatory Visit: Payer: Self-pay

## 2019-03-22 NOTE — Telephone Encounter (Signed)
Please return our call to discuss diabetic supplies and your diet with low blood sugars.

## 2019-04-12 ENCOUNTER — Other Ambulatory Visit: Payer: Self-pay | Admitting: Physician Assistant

## 2019-04-13 ENCOUNTER — Telehealth: Payer: Self-pay

## 2019-04-13 MED ORDER — BD SWAB SINGLE USE REGULAR PADS: 1.0000 [IU] | MEDICATED_PAD | Freq: Two times a day (BID) | 9 refills | Status: DC

## 2019-04-15 ENCOUNTER — Telehealth: Payer: Self-pay | Admitting: Physician Assistant

## 2019-04-15 NOTE — Telephone Encounter (Signed)
Pt is wanting to talk to AJ about her pain mgmt, and what she thinks is best option for the  her

## 2019-04-15 NOTE — Telephone Encounter (Signed)
Spoke with pt and she just wants to discuss with AJ her pain management. Advised pt AJ out of the office today but will forward message and AJ will call her back when she returns to office. Pt voiced understanding.

## 2019-04-17 DIAGNOSIS — J449 Chronic obstructive pulmonary disease, unspecified: Secondary | ICD-10-CM | POA: Diagnosis not present

## 2019-04-20 NOTE — Telephone Encounter (Signed)
But I cannot do her pain management.  She did not pass her last drug screen.  So that is why she needed to go to a pain clinic.  I will not be able to prescribe her medication by our policy.

## 2019-04-20 NOTE — Telephone Encounter (Signed)
lmtcb

## 2019-04-21 NOTE — Telephone Encounter (Signed)
Aware.  No pain medications will be prescribed by our office or her provider.

## 2019-04-30 ENCOUNTER — Other Ambulatory Visit: Payer: Self-pay | Admitting: Physician Assistant

## 2019-05-04 ENCOUNTER — Other Ambulatory Visit: Payer: Self-pay | Admitting: Physician Assistant

## 2019-05-04 ENCOUNTER — Ambulatory Visit (HOSPITAL_COMMUNITY)
Admission: RE | Admit: 2019-05-04 | Discharge: 2019-05-04 | Disposition: A | Discharge status: Home / Self Care | Payer: Medicare PPO | Source: Ambulatory Visit | Attending: Cardiology | Admitting: Cardiology

## 2019-05-04 ENCOUNTER — Other Ambulatory Visit: Payer: Self-pay

## 2019-05-04 ENCOUNTER — Encounter (HOSPITAL_COMMUNITY): Payer: Self-pay

## 2019-05-04 DIAGNOSIS — J9611 Chronic respiratory failure with hypoxia: Secondary | ICD-10-CM | POA: Diagnosis not present

## 2019-05-04 DIAGNOSIS — I5032 Chronic diastolic (congestive) heart failure: Secondary | ICD-10-CM

## 2019-05-04 DIAGNOSIS — I272 Pulmonary hypertension, unspecified: Secondary | ICD-10-CM

## 2019-05-04 DIAGNOSIS — I509 Heart failure, unspecified: Secondary | ICD-10-CM

## 2019-05-04 NOTE — Progress Notes (Signed)
Heart Failure TeleHealth Note  Due to national recommendations of social distancing due to [place] 19, Audio/video telehealth visit is felt to be most appropriate for this patient at this time.  See MyChart message from today for patient consent regarding telehealth for [facility].  Date:  05/04/2019   ID:  Kelly Donaldson, DOB [birth year], MRN [id number]  Location: Home  Provider location: [facility] [facility] Type of Visit: Established patient   PCP:  Terald Sleeper, PA-C  Cardiologist:  Dr. [name]  Chief Complaint: Shortness of breath.   History of Present Illness: Kelly Donaldson is a 64 y.o. female who presents via audio/video conferencing for a telehealth visit today.     she denies symptoms worrisome for COVID 19.   Patient has a history of chronic hypoxemic respiratory failure and pulmonary hypertension.  She has been followed by Dr Melvyn Novas, now follows with [name] with pulmonology.  Has been on home oxygen for several years now.  She quit smoking in 2016.  She had an echo done in 5/16 showing dilated RV with severe pulmonary hypertension.  She therefore had RHC in 5/16 which confirmed severe pulmonary hypertension and RV failure.  PFTs showed only mild obstruction and restriction with markedly decreased DLCO suggestive of pulmonary vascular disease.  There was concern for sarcoidosis on CT chest but lymph node biopsy showed no evidence for sarcoidosis.  V/Q scan showed no evidence for chronic PE.   She has not tolerated Revatio or Adcirca.  Have failed previous up-titration of Selexipag with pelvic pain, can tolerate 200 mcg bid.  She is now tolerating riociguat 2 mg tid.   Started torsemide in Oct. 2017, has had better diuresis.   Due to ongoing dyspnea, she had repeat RHC in 5/19 that showed normal filling pressures and well-controlled pulmonary hypertension.  Echo in 6/19 showed normal LV EF, normal RV systolic function, and mild RV dilation.   She now is  on [place]oro, feels like this has helped her breathing.    Overall, she says that she is doing better.  Able to make up her bed and do laundary without significant dyspnea.  She is able to walk in her house without dyspnea.  No chest pain, no lightheadedness. No peripheral edema.  She is using 2L [place] oxygen at rest and 3L with exertion.   6 minute walk (5/16): 238 meters => decreased oxygen saturation to 73%, increased to 6 L [place] 6 minute walk (7/16): 170 meters => unable to complete.  6 minute walk (9/16): 201 meters 6 minute walk (1/17): 256 meters 6 minute walk (4/17): 219 meters 6 minute walk (8/17): 225 meters 6 minute walk (11/17): 244 meters 6 minute walk (5/18): 229 meters 6 minute walk (2/19): 122 meters (but she was stopped halfway through with drop in oxygen saturation.   Labs (2/16): BNP 69 Labs (5/16): LFTs normal, HCT 34.9, RF negative, HIV negative, TSH negative Labs (6/16): K 4.4, creatinine 0.88 Labs (7/16): ANA negative, BNP 58, K 4.3, creatinine 1.1, anti-RNP negative Labs (9/16): K 4.4, creatinine 1.26, BNP 91 Labs (10/16): K 4.3, creatinine 1.04 Labs (12/16): K 4.2, creatinine 1.07, BNP 130 Labs (1/17): K 4.3, creatinine 1.23, BNP 42 Labs (3/17): K 4.2, creatinine 1.19 Labs (5/17): K 4.3, creatinine 1.35, BNP 44 Labs (6/17): K 4.4, creatinine 1.18, BNP 44 Labs (10/17): K 4.2, creatinine 0.98, BNP 43 Labs (12/17): K 4.5, creatinine 1.23. BNP 64.5 Labs (2/18): K 4.5, creatinine 1.21, BNP 64  Labs (4/18): K 4.1, creatinine 1.24, BNP 26 Labs (9/18): K 4.6, creatinine 1.25 Labs (2/19): K 4.2, creatinine 1.39 Labs (5/19): K 4, creatinine 1.4, hgb 8.4 Labs (12/19): K 4.3, creatinine 1.15 . PMH: 1. Pulmonary hypertension: RHC (5/16) with mean RA 18, PA 83/33 mean 53, mean PCWP 19, CI 2.61, PVR 5.8 WU.   Echo (5/16) with EF 65-70%, septal flattening, dilated RV with PA systolic pressure 99 mmHg, +bubble study.  PFTs (4/16) with FVC 76%, FEV1 75%, ratio 97%, TLC 66%, DLCO 26%  => mild restriction, mild obstruction, severe diffuse abnormality (pulmonary vascular problem).  CTA chest (4/16) with chronic interstitial and obstructive lung disease, small mediastinal and hilar lymph noes, no PE.  Lymph node biopsy negative for sarcoidosis (reactive changes).  V/Q scan (6/16): No acute or chronic PE. TEE (6/16): Normal LV size and systolic function, EF 46-28%,MN was mildly dilated with mildly decreased systolic function, D-shaped interventricular septum suggestive of RV pressure/volume overload, there appeared to be a small PFO present with some bubbles crossing, no ASD.  ANA negative, anti-RNP negative, HIV negative. Sleep study (8/16) without OSA.  She did not tolerate Adcirca or Revatio.  She cannot tolerate more than 200 mcg bid Selexipag.  - Echo (6/17): EF 81-77%, normal diastolic function, D-shaped interventricular septum, mildly dilated RV with normal RV systolic function, PASP 39, IVC normal.  - Echo (6/18): EF 11-65%, normal diastolic function, D-shaped interventricular septum, mildly dilated RV with normal RV systolic function, PASP 65 mmHg.  - RHC (7/18): RA 13, PA 51/23 mean 36, PCWP 19, CI 4.74, PVR 1.6 WU - RHC (5/19): mean RA 8, PA 48/20 mean 30, mean PCWP 13, CI 5.06, PVR 1.58 WU.  - Echo (6/19): EF 60-65%, mild RV dilation with normal RV systolic function, PASP 60, possible PFO.  2. Chronic venous insufficiency. 3. Hyperlipidemia.  4. GERD 5. Chronic hypoxemic respiratory failure: Hypoxemia, on home oxygen.  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  - PFTs (7/19): FEV1 82%, FVC 80%, ratio 101%, TLC 73%, DLCO 23%.   Current Outpatient Medications  Medication Sig Dispense Refill  . Accu-Chek Softclix Lancets lancets Check glucose BID 100 each 12  . Alcohol Swabs (B-D SINGLE USE SWABS REGULAR) PADS 1 Units by Does not apply route 2 (two) times daily. 100 each 9  . aspirin EC 81 MG tablet Take 1 tablet (81 mg total) by mouth daily. 90  tablet 3  . Blood Glucose Calibration (ACCU-CHEK AVIVA) SOLN 1 Units by In Vitro route every 30 (thirty) days. 3 each 0  . Blood Glucose Monitoring Suppl (ACCU-CHEK AVIVA PLUS) w/Device KIT USE AS DIRECTED 1 kit 0  . DULoxetine (CYMBALTA) 30 MG capsule Take 1-2 capsules (30-60 mg total) by mouth daily. 60 capsule 5  . fluticasone (FLONASE) 50 MCG/ACT nasal spray Place 1 spray into both nostrils daily. 48 g 3  . glucose blood (ACCU-CHEK AVIVA PLUS) test strip Check glucose BID 100 each 12  . HYDROcodone-acetaminophen (NORCO) 10-325 MG tablet Take 1-2 tablets by mouth every 6 (six) hours as needed. 210 tablet 0  . HYDROcodone-acetaminophen (NORCO) 10-325 MG tablet Take 1-2 tablets by mouth every 6 (six) hours as needed. 210 tablet 0  . HYDROcodone-acetaminophen (NORCO) 10-325 MG tablet Take 1-2 tablets by mouth every 6 (six) hours as needed. 210 tablet 0  . HYDROcodone-homatropine (HYCODAN) 5-1.5 MG/5ML syrup Take 5-10 mLs by mouth every 6 (six) hours as needed. 240 mL 0  . ipratropium-albuterol (DUONEB) 0.5-2.5 (3)  MG/3ML SOLN INHALE THE CONTENTS OF 1 VIAL VIA NEBULIZER FOUR TIMES DAILY AS NEEDED 360 mL 4  . macitentan (OPSUMIT) 10 MG tablet Take 1 tablet (10 mg total) by mouth daily. 90 tablet 3  . OXYGEN Take 2.5-4 L by mouth See admin instructions. 2.5 L with resting state & 4 L with exertion [facility]    . Polyethyl Glycol-Propyl Glycol (LUBRICANT EYE DROPS) 0.4-0.3 % SOLN Place 1 drop into both eyes daily.    . potassium chloride SA (K-DUR,KLOR-CON) 20 MEQ tablet TAKE 1 TABLET TWICE DAILY 180 tablet 3  . pravastatin (PRAVACHOL) 20 MG tablet TAKE 1 TABLET EVERY DAY 90 tablet 3  . Revefenacin (YUPELRI) 175 MCG/3ML SOLN Inhale 175 mcg into the lungs daily. 21 mL 0  . Riociguat (ADEMPAS) 2 MG TABS Take 2 mg by mouth 3 (three) times daily. 90 tablet 11  . Selexipag 200 MCG TABS Take 1 tablet (200 mcg total) by mouth 2 (two) times daily. 60 tablet 6  . torsemide (DEMADEX) 20 MG tablet Take 2  tablets (40 mg total) by mouth 2 (two) times daily. 360 tablet 3  . umeclidinium-vilanterol (ANORO ELLIPTA) 62.5-25 MCG/INH AEPB Inhale 1 puff into the lungs daily. 1 each 3  . Vitamin D, Ergocalciferol, (DRISDOL) 1.25 MG (50000 UT) CAPS capsule TAKE 1 CAPSULE EVERY MONDAY 12 capsule 3  . zolpidem (AMBIEN) 5 MG tablet Take 1 tablet (5 mg total) by mouth at bedtime. 30 tablet 4   No current facility-administered medications for this encounter.     Allergies:   Gabapentin and Latex   Social History:  The patient  reports that she quit smoking about 4 years ago. Her smoking use included cigarettes. She has a 4.25 pack-year smoking history. She has never used smokeless tobacco. She reports current drug use. Drug: Cocaine. She reports that she does not drink alcohol.   Family History:  The patient's family history includes Asthma in her sister; Breast cancer in her maternal grandmother; Clotting disorder in her daughter; Diabetes in her mother; Emphysema in her father; Hyperlipidemia in her father and mother; Hypertension in her daughter, father, and mother; Other in her mother; Ovarian cancer in her maternal aunt.   ROS:  Please see the history of present illness.   All other systems are personally reviewed and negative.   Exam:  (Video/Tele Health Call; Exam is subjective and or/visual.) General:  Speaks in full sentences. No resp difficulty. Neck: No JVD.  Lungs: Normal respiratory effort with conversation.  Abdomen: Non-distended per patient report Extremities: Pt denies edema. Neuro: Alert & oriented x 3.   Recent Labs: 05/06/2018: Hemoglobin 8.4; Platelets 191 06/18/2018: B Natriuretic Peptide 33.8; Magnesium 2.2 11/24/2018: BUN 17; Creatinine, Ser 1.15; Potassium 4.3; Sodium 137  Personally reviewed   Wt Readings from Last 3 Encounters:  02/12/19 91.9 kg (202 lb 9.6 oz)  02/11/19 91.9 kg (202 lb 11.2 oz)  11/24/18 94.5 kg (208 lb 6.4 oz)      ASSESSMENT AND PLAN:  1. Pulmonary  arterial hypertension with RV failure: Severe on 5/16 RHC. 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  There was concern from CTA chest for sarcoidosis, but lymph node biopsy was negative.  LFTs normal, ANA negative, anti-RNP negative, HIV negative.  V/Q scan negative for chronic PE.  Small PFO but no ASD on TEE. No OSA on sleep study.  She did not tolerate Revatio due to joint  pain (now resolved), and she did not tolerate Adcirca with worsening dyspnea. She does not tolerate more than 200 mcg bid Selexipag due to pelvic pain.  Most recent echo in 6/19 showed normal LV EF, normal RV systolic function with mild RV dilation, and PASP 60.  RHCs in 7/18 and 5/19 have shown well-compensated pulmonary hypertension.  PVR was 1.58 WU on 5/19 study. She is improved symptomatically. I think that a lot of her residual dyspnea is due to COPD + ILD rather than pulmonary hypertension.  - She will need echo and 6 minute walk arranged for followup appt.  - Open lung biopsy deemed too risky, probably not sarcoid per pulmonary evaluation.    - Continue opsumit and riociguat 2 mg twice a day (unable to go higher due to dizziness).   - She has tolerated selexipag 200 mcg bid but has not been able to increase.  2. Chronic hypoxemic respiratory failure: Chronic interstitial lung disease + emphysema.  Concern for sarcoidosis but biopsy did not show sarcoid.  Deemed too high risk for open lung biopsy.  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 - Follows with Dr Lake Bells - On 2 L O2 at rest, 3 L with activity.  3. Chronic diastolic CHF: Echo 07/8756: EF 60-65%, RV mildly dilated, normal function, PA peak pressure 66 mm Hg. NYHA III. Volume stable on video exam. - Continue torsemide 40 mg BID. I will arrange for a BMET.   COVID screen The patient does not have any symptoms that suggest any further testing/ screening at this time.  Social  distancing reinforced today.  Patient Risk: After full review of this patients clinical status, I feel that they are at moderate risk for cardiac decompensation at this time.  Relevant cardiac medications were reviewed at length with the patient today. The patient does not have concerns regarding their medications at this time.   Recommended follow-up:  Echo + followup in 3 months.   Today, I have spent 17 minutes with the patient with telehealth technology discussing the above issues .    Signed, Loralie Champagne, MD  05/04/2019  [place] [address] [facility] [place] [address] [phone] (office) [phone] (fax)

## 2019-05-10 ENCOUNTER — Other Ambulatory Visit: Payer: Self-pay | Admitting: Pulmonary Disease

## 2019-05-11 ENCOUNTER — Other Ambulatory Visit: Payer: Self-pay

## 2019-05-12 ENCOUNTER — Telehealth: Payer: Self-pay | Admitting: Physician Assistant

## 2019-05-12 ENCOUNTER — Ambulatory Visit (INDEPENDENT_AMBULATORY_CARE_PROVIDER_SITE_OTHER): Payer: Medicare PPO | Admitting: Physician Assistant

## 2019-05-12 ENCOUNTER — Encounter: Payer: Self-pay | Admitting: Physician Assistant

## 2019-05-12 VITALS — BP 100/59 | HR 75 | Temp 98.1°F | Ht 67.0 in | Wt 192.6 lb

## 2019-05-12 DIAGNOSIS — I2721 Secondary pulmonary arterial hypertension: Secondary | ICD-10-CM | POA: Diagnosis not present

## 2019-05-12 DIAGNOSIS — M5136 Other intervertebral disc degeneration, lumbar region: Secondary | ICD-10-CM

## 2019-05-12 DIAGNOSIS — M170 Bilateral primary osteoarthritis of knee: Secondary | ICD-10-CM | POA: Diagnosis not present

## 2019-05-12 DIAGNOSIS — J309 Allergic rhinitis, unspecified: Secondary | ICD-10-CM

## 2019-05-12 DIAGNOSIS — I509 Heart failure, unspecified: Secondary | ICD-10-CM

## 2019-05-12 LAB — LIPID PANEL

## 2019-05-12 MED ORDER — DULOXETINE HCL 60 MG PO CPEP: 120.0000 mg | ORAL_CAPSULE | Freq: Every day | ORAL | 5 refills | Status: DC

## 2019-05-12 MED ORDER — UMECLIDINIUM-VILANTEROL 62.5-25 MCG/INH IN AEPB: INHALATION_SPRAY | RESPIRATORY_TRACT | 3 refills | Status: DC

## 2019-05-12 MED ORDER — ZOLPIDEM TARTRATE 5 MG PO TABS: 5.0000 mg | ORAL_TABLET | Freq: Every day | ORAL | 5 refills | Status: DC

## 2019-05-12 MED ORDER — FLUTICASONE PROPIONATE 50 MCG/ACT NA SUSP: 1.0000 | Freq: Every day | NASAL | 3 refills | Status: DC

## 2019-05-13 LAB — LIPID PANEL
Chol/HDL Ratio: 2.3 ratio (ref 0.0–4.4)
Cholesterol, Total: 168 mg/dL (ref 100–199)
HDL: 74 mg/dL (ref >39)
LDL Calculated: 78 mg/dL (ref 0–99)
Triglycerides: 78 mg/dL (ref 0–149)
VLDL Cholesterol Cal: 16 mg/dL (ref 5–40)

## 2019-05-13 LAB — CMP14+EGFR
ALT: 8 IU/L (ref 0–32)
AST: 14 IU/L (ref 0–40)
Albumin/Globulin Ratio: 1.5 (ref 1.2–2.2)
Albumin: 4.3 g/dL (ref 3.8–4.8)
Alkaline Phosphatase: 62 IU/L (ref 39–117)
BUN/Creatinine Ratio: 15 (ref 12–28)
BUN: 16 mg/dL (ref 8–27)
Bilirubin Total: 0.3 mg/dL (ref 0.0–1.2)
CO2: 28 mmol/L (ref 20–29)
Calcium: 9 mg/dL (ref 8.7–10.3)
Chloride: 99 mmol/L (ref 96–106)
Creatinine, Ser: 1.04 mg/dL — ABNORMAL HIGH (ref 0.57–1.00)
GFR calc Af Amer: 66 mL/min/{1.73_m2} (ref >59)
GFR calc non Af Amer: 57 mL/min/{1.73_m2} — ABNORMAL LOW (ref >59)
Globulin, Total: 2.8 g/dL (ref 1.5–4.5)
Glucose: 116 mg/dL — ABNORMAL HIGH (ref 65–99)
Potassium: 4 mmol/L (ref 3.5–5.2)
Sodium: 139 mmol/L (ref 134–144)
Total Protein: 7.1 g/dL (ref 6.0–8.5)

## 2019-05-13 LAB — CBC WITH DIFFERENTIAL/PLATELET
Basophils Absolute: 0.1 10*3/uL (ref 0.0–0.2)
Basos: 1 %
EOS (ABSOLUTE): 0.8 10*3/uL — ABNORMAL HIGH (ref 0.0–0.4)
Eos: 8 %
Hematocrit: 28.3 % — ABNORMAL LOW (ref 34.0–46.6)
Hemoglobin: 7.4 g/dL — CL (ref 11.1–15.9)
Immature Grans (Abs): 0 10*3/uL (ref 0.0–0.1)
Immature Granulocytes: 0 %
Lymphocytes Absolute: 2.1 10*3/uL (ref 0.7–3.1)
Lymphs: 20 %
MCH: 16.4 pg — ABNORMAL LOW (ref 26.6–33.0)
MCHC: 26.1 g/dL — ABNORMAL LOW (ref 31.5–35.7)
MCV: 63 fL — ABNORMAL LOW (ref 79–97)
Monocytes Absolute: 0.7 10*3/uL (ref 0.1–0.9)
Monocytes: 6 %
Neutrophils Absolute: 6.7 10*3/uL (ref 1.4–7.0)
Neutrophils: 65 %
Platelets: 291 10*3/uL (ref 150–450)
RBC: 4.5 x10E6/uL (ref 3.77–5.28)
RDW: 17.8 % — ABNORMAL HIGH (ref 11.7–15.4)
WBC: 10.4 10*3/uL (ref 3.4–10.8)

## 2019-05-13 LAB — TSH: TSH: 1.59 u[IU]/mL (ref 0.450–4.500)

## 2019-05-14 ENCOUNTER — Encounter: Payer: Self-pay | Admitting: *Deleted

## 2019-05-17 ENCOUNTER — Other Ambulatory Visit: Payer: Self-pay | Admitting: Physician Assistant

## 2019-05-17 ENCOUNTER — Encounter: Payer: Self-pay | Admitting: Physician Assistant

## 2019-05-17 DIAGNOSIS — M5136 Other intervertebral disc degeneration, lumbar region: Secondary | ICD-10-CM

## 2019-05-17 DIAGNOSIS — M170 Bilateral primary osteoarthritis of knee: Secondary | ICD-10-CM

## 2019-05-17 NOTE — Progress Notes (Signed)
BP (!) 100/59   Pulse 75   Temp 98.1 F (36.7 C) (Oral)   Ht '5\' 7"'$  (1.702 m)   Wt 192 lb 9.6 oz (87.4 kg)   BMI 30.17 kg/m    Subjective:    Patient ID: Kelly Donaldson, female    DOB: [birth year], 63 y.o.   MRN: [id number]  HPI: Kelly Donaldson is a 63 y.o. female presenting on 05/12/2019 for Medical Management of Chronic Issues (3 month ); Hyperlipidemia; and Depression  This patient comes in for management of her chronic medical conditions which do include arthritis of the knees, degenerative disc disease, allergic rhinitis, pulmonary hypertension, congestive heart failure.  She does need labs performed today.  We will forward her labs to the chronic heart failure clinic.  They had requested a set of labs.  She states overall she feels quite stable with everything that you are doing.  Her blood pressure readings have been great.  Her oxygenation, pulse etc. have been good.  She does have still a significant amount of pain related to her multiple joint issues.  We will make a pain referral for her to [facility] pain Center in .  Past Medical History:  Diagnosis Date  . Allergy   . Depression   . GERD (gastroesophageal reflux disease)   . Hyperlipidemia   . Oxygen dependent   . Shortness of breath dyspnea   . Varicose veins    Relevant past medical, surgical, family and social history reviewed and updated as indicated. Interim medical history since our last visit reviewed. Allergies and medications reviewed and updated. DATA REVIEWED: CHART IN EPIC  Family History reviewed for pertinent findings.  Review of Systems  Constitutional: Positive for fatigue.  HENT: Negative.   Eyes: Negative.   Respiratory: Negative.   Gastrointestinal: Negative.   Genitourinary: Negative.   Musculoskeletal: Positive for arthralgias, gait problem, joint swelling and myalgias.    Allergies as of 05/12/2019      Reactions   Gabapentin Other (See Comments)   Pt states that she can not take  with antidepressants   Latex Rash      Medication List       Accurate as of May 12, 2019 11:59 PM. If you have any questions, ask your nurse or doctor.        STOP taking these medications   HYDROcodone-acetaminophen 10-325 MG tablet Commonly known as:  NORCO Stopped by:  Terald Sleeper, PA-C   HYDROcodone-homatropine 5-1.5 MG/5ML syrup Commonly known as:  HYCODAN Stopped by:  Terald Sleeper, PA-C   Revefenacin 175 MCG/3ML Soln Commonly known as:  Yupelri Stopped by:  Terald Sleeper, PA-C     TAKE these medications   Accu-Chek Aviva Plus w/Device Kit USE AS DIRECTED   Accu-Chek Aviva Soln 1 Units by In Vitro route every 30 (thirty) days.   Accu-Chek Softclix Lancets lancets Check glucose BID   aspirin EC 81 MG tablet Take 1 tablet (81 mg total) by mouth daily.   B-D SINGLE USE SWABS REGULAR Pads 1 Units by Does not apply route 2 (two) times daily.   DULoxetine 60 MG capsule Commonly known as:  CYMBALTA Take 2 capsules (120 mg total) by mouth daily. What changed:    medication strength  how much to take Changed by:  Terald Sleeper, PA-C   fluticasone 50 MCG/ACT nasal spray Commonly known as:  FLONASE Place 1 spray into both nostrils daily.   glucose blood test strip Commonly known  as:  Accu-Chek Aviva Plus Check glucose BID   ipratropium-albuterol 0.5-2.5 (3) MG/3ML Soln Commonly known as:  DUONEB INHALE THE CONTENTS OF 1 VIAL VIA NEBULIZER FOUR TIMES DAILY AS NEEDED   Lubricant Eye Drops 0.4-0.3 % Soln Generic drug:  Polyethyl Glycol-Propyl Glycol Place 1 drop into both eyes daily.   macitentan 10 MG tablet Commonly known as:  Opsumit Take 1 tablet (10 mg total) by mouth daily.   OXYGEN Take 2.5-4 L by mouth See admin instructions. 2.5 L with resting state & 4 L with exertion [facility]   potassium chloride SA 20 MEQ tablet Commonly known as:  K-DUR TAKE 1 TABLET TWICE DAILY   pravastatin 20 MG tablet Commonly known as:  PRAVACHOL TAKE 1  TABLET EVERY DAY   Riociguat 2 MG Tabs Commonly known as:  Adempas Take 2 mg by mouth 3 (three) times daily.   Selexipag 200 MCG Tabs Take 1 tablet (200 mcg total) by mouth 2 (two) times daily.   torsemide 20 MG tablet Commonly known as:  DEMADEX Take 2 tablets (40 mg total) by mouth 2 (two) times daily.   umeclidinium-vilanterol 62.5-25 MCG/INH Aepb Commonly known as:  Anoro Ellipta INHALE 1 PUFF INTO THE LUNGS DAILY.   Vitamin D (Ergocalciferol) 1.25 MG (50000 UT) Caps capsule Commonly known as:  DRISDOL TAKE 1 CAPSULE EVERY MONDAY   zolpidem 5 MG tablet Commonly known as:  AMBIEN Take 1 tablet (5 mg total) by mouth at bedtime.          Objective:    BP (!) 100/59   Pulse 75   Temp 98.1 F (36.7 C) (Oral)   Ht '5\' 7"'$  (1.702 m)   Wt 192 lb 9.6 oz (87.4 kg)   BMI 30.17 kg/m   Allergies  Allergen Reactions  . Gabapentin Other (See Comments)    Pt states that she can not take with antidepressants  . Latex Rash    Wt Readings from Last 3 Encounters:  05/12/19 192 lb 9.6 oz (87.4 kg)  02/12/19 202 lb 9.6 oz (91.9 kg)  02/11/19 202 lb 11.2 oz (91.9 kg)    Physical Exam Constitutional:      Appearance: She is well-developed.  HENT:     Head: Normocephalic and atraumatic.  Eyes:     Conjunctiva/sclera: Conjunctivae normal.     Pupils: Pupils are equal, round, and reactive to light.  Cardiovascular:     Rate and Rhythm: Normal rate and regular rhythm.     Heart sounds: Normal heart sounds.  Pulmonary:     Effort: Pulmonary effort is normal.     Breath sounds: Normal breath sounds.  Abdominal:     General: Bowel sounds are normal.     Palpations: Abdomen is soft.  Musculoskeletal:     Right knee: She exhibits decreased range of motion, swelling and deformity.     Left knee: She exhibits decreased range of motion, swelling and deformity.     Lumbar back: She exhibits decreased range of motion, tenderness and pain.  Skin:    General: Skin is warm and dry.      Findings: No rash.  Neurological:     Mental Status: She is alert and oriented to person, place, and time.     Deep Tendon Reflexes: Reflexes are normal and symmetric.  Psychiatric:        Behavior: Behavior normal.        Thought Content: Thought content normal.        Judgment:  Judgment normal.     Results for orders placed or performed in visit on 05/12/19  Lipid panel  Result Value Ref Range   Cholesterol, Total 168 100 - 199 mg/dL   Triglycerides 78 0 - 149 mg/dL   HDL 74 >39 mg/dL   VLDL Cholesterol Cal 16 5 - 40 mg/dL   LDL Calculated 78 0 - 99 mg/dL   Chol/HDL Ratio 2.3 0.0 - 4.4 ratio  CBC with Differential/Platelet  Result Value Ref Range   WBC 10.4 3.4 - 10.8 x10E3/uL   RBC 4.50 3.77 - 5.28 x10E6/uL   Hemoglobin 7.4 (LL) 11.1 - 15.9 g/dL   Hematocrit 28.3 (L) 34.0 - 46.6 %   MCV 63 (L) 79 - 97 fL   MCH 16.4 (L) 26.6 - 33.0 pg   MCHC 26.1 (L) 31.5 - 35.7 g/dL   RDW 17.8 (H) 11.7 - 15.4 %   Platelets 291 150 - 450 x10E3/uL   Neutrophils 65 Not Estab. %   Lymphs 20 Not Estab. %   Monocytes 6 Not Estab. %   Eos 8 Not Estab. %   Basos 1 Not Estab. %   Neutrophils Absolute 6.7 1.4 - 7.0 x10E3/uL   Lymphocytes Absolute 2.1 0.7 - 3.1 x10E3/uL   Monocytes Absolute 0.7 0.1 - 0.9 x10E3/uL   EOS (ABSOLUTE) 0.8 (H) 0.0 - 0.4 x10E3/uL   Basophils Absolute 0.1 0.0 - 0.2 x10E3/uL   Immature Granulocytes 0 Not Estab. %   Immature Grans (Abs) 0.0 0.0 - 0.1 x10E3/uL  CMP14+EGFR  Result Value Ref Range   Glucose 116 (H) 65 - 99 mg/dL   BUN 16 8 - 27 mg/dL   Creatinine, Ser 1.04 (H) 0.57 - 1.00 mg/dL   GFR calc non Af Amer 57 (L) >59 mL/min/1.73   GFR calc Af Amer 66 >59 mL/min/1.73   BUN/Creatinine Ratio 15 12 - 28   Sodium 139 134 - 144 mmol/L   Potassium 4.0 3.5 - 5.2 mmol/L   Chloride 99 96 - 106 mmol/L   CO2 28 20 - 29 mmol/L   Calcium 9.0 8.7 - 10.3 mg/dL   Total Protein 7.1 6.0 - 8.5 g/dL   Albumin 4.3 3.8 - 4.8 g/dL   Globulin, Total 2.8 1.5 - 4.5 g/dL    Albumin/Globulin Ratio 1.5 1.2 - 2.2   Bilirubin Total 0.3 0.0 - 1.2 mg/dL   Alkaline Phosphatase 62 39 - 117 IU/L   AST 14 0 - 40 IU/L   ALT 8 0 - 32 IU/L  TSH  Result Value Ref Range   TSH 1.590 0.450 - 4.500 uIU/mL      Assessment & Plan:   1. Primary osteoarthritis of both knees - DULoxetine (CYMBALTA) 60 MG capsule; Take 2 capsules (120 mg total) by mouth daily.  Dispense: 60 capsule; Refill: 5  2. DDD (degenerative disc disease), lumbar - DULoxetine (CYMBALTA) 60 MG capsule; Take 2 capsules (120 mg total) by mouth daily.  Dispense: 60 capsule; Refill: 5  3. Allergic rhinitis, unspecified seasonality, unspecified trigger - fluticasone (FLONASE) 50 MCG/ACT nasal spray; Place 1 spray into both nostrils daily.  Dispense: 48 g; Refill: 3  4. PAH (pulmonary artery hypertension) (HCC) - Lipid panel - CBC with Differential/Platelet - CMP14+EGFR - TSH  5. Congestive heart failure, unspecified HF chronicity, unspecified heart failure type ([facility]) - Lipid panel - CBC with Differential/Platelet - CMP14+EGFR - TSH   Continue all other maintenance medications as listed above.  Follow up plan: Recheck 3-6 months  Educational handout given for [place] PA-C [place] [address]  [place], [place] [address] [phone]   05/17/2019, 12:52 PM

## 2019-05-18 DIAGNOSIS — J449 Chronic obstructive pulmonary disease, unspecified: Secondary | ICD-10-CM | POA: Diagnosis not present

## 2019-05-20 DIAGNOSIS — M170 Bilateral primary osteoarthritis of knee: Secondary | ICD-10-CM | POA: Diagnosis not present

## 2019-05-20 DIAGNOSIS — Z79899 Other long term (current) drug therapy: Secondary | ICD-10-CM | POA: Diagnosis not present

## 2019-05-20 DIAGNOSIS — M5137 Other intervertebral disc degeneration, lumbosacral region: Secondary | ICD-10-CM | POA: Diagnosis not present

## 2019-05-20 DIAGNOSIS — D869 Sarcoidosis, unspecified: Secondary | ICD-10-CM | POA: Diagnosis not present

## 2019-05-20 DIAGNOSIS — I272 Pulmonary hypertension, unspecified: Secondary | ICD-10-CM | POA: Diagnosis not present

## 2019-05-25 ENCOUNTER — Ambulatory Visit: Payer: Medicare PPO

## 2019-05-27 DIAGNOSIS — M5137 Other intervertebral disc degeneration, lumbosacral region: Secondary | ICD-10-CM | POA: Diagnosis not present

## 2019-05-27 DIAGNOSIS — M170 Bilateral primary osteoarthritis of knee: Secondary | ICD-10-CM | POA: Diagnosis not present

## 2019-05-27 DIAGNOSIS — D869 Sarcoidosis, unspecified: Secondary | ICD-10-CM | POA: Diagnosis not present

## 2019-05-27 DIAGNOSIS — Z79899 Other long term (current) drug therapy: Secondary | ICD-10-CM | POA: Diagnosis not present

## 2019-05-27 DIAGNOSIS — I272 Pulmonary hypertension, unspecified: Secondary | ICD-10-CM | POA: Diagnosis not present

## 2019-06-01 ENCOUNTER — Telehealth: Payer: Self-pay | Admitting: Physician Assistant

## 2019-06-01 DIAGNOSIS — I272 Pulmonary hypertension, unspecified: Secondary | ICD-10-CM | POA: Diagnosis not present

## 2019-06-01 DIAGNOSIS — D869 Sarcoidosis, unspecified: Secondary | ICD-10-CM | POA: Diagnosis not present

## 2019-06-01 DIAGNOSIS — D509 Iron deficiency anemia, unspecified: Secondary | ICD-10-CM | POA: Diagnosis not present

## 2019-06-01 NOTE — Telephone Encounter (Signed)
Patient states [facility] needs a copy of her labs. I advised patient to call and get [place] the Dr. Information of who we need to send it to.

## 2019-06-04 ENCOUNTER — Ambulatory Visit: Payer: Medicare PPO | Admitting: Physician Assistant

## 2019-06-07 ENCOUNTER — Other Ambulatory Visit: Payer: Self-pay

## 2019-06-07 ENCOUNTER — Ambulatory Visit (INDEPENDENT_AMBULATORY_CARE_PROVIDER_SITE_OTHER): Payer: Medicare PPO | Admitting: *Deleted

## 2019-06-07 VITALS — Ht 67.0 in | Wt 192.6 lb

## 2019-06-07 DIAGNOSIS — Z0000 Encounter for general adult medical examination without abnormal findings: Secondary | ICD-10-CM

## 2019-06-07 NOTE — Progress Notes (Signed)
MEDICARE ANNUAL WELLNESS VISIT  06/07/2019  Telephone Visit Disclaimer This Medicare AWV was conducted by telephone due to national recommendations for restrictions regarding the COVID-19 Pandemic (e.g. social distancing).  I verified, using two identifiers, that I am speaking with Kelly Donaldson or their authorized healthcare agent. I discussed the limitations, risks, security, and privacy concerns of performing an evaluation and management service by telephone and the potential availability of an in-person appointment in the future. The patient expressed understanding and agreed to proceed.   Subjective:  Kelly Donaldson is a 63 y.o. female patient of Terald Sleeper, PA-C who had a Medicare Annual Wellness Visit today via telephone. Kelly Donaldson is Disabled and lives with their spouse. she has 3 children. she reports that she is socially active and does interact with friends/family regularly. she is not physically active and enjoys gardening.  Patient Care Team: Theodoro Clock as PCP - General (Physician Assistant) Theodoro Clock as Physician Assistant (General Practice)  Advanced Directives 06/07/2019 05/06/2018 06/13/2017 04/17/2017 02/07/2017 07/23/2015 05/11/2015  Does Patient Have a Medical Advance Directive? No No No Yes No No Yes  Type of Advance Directive - - - Living will - - [place];Living will  Does patient want to make changes to medical advance directive? - - - No - Patient declined - - -  Copy of [place] in Chart? - - - - - - No - copy requested  Would patient like information on creating a medical advance directive? Yes (MAU/Ambulatory/Procedural Areas - Information given) No - Patient declined No - Patient declined - - No - patient declined information -    Hospital Utilization Over the Past 12 Months: # of hospitalizations or ER visits: 1 # of surgeries: 0  Review of Systems    Patient reports that her overall health is  unchanged compared to last year.  Patient Reported Readings (BP, Pulse, CBG, Weight, etc) none  Review of Systems: History obtained from chart review and the patient General ROS: positive for  - Bilateral Knee pain Musculoskeletal ROS: positive for - pain in knee - bilateral  All other systems negative.  Pain Assessment Pain : 0-10 Pain Score: 6  Pain Type: Chronic pain Pain Location: Knee Pain Orientation: Right, Left Pain Descriptors / Indicators: Throbbing Pain Onset: More than a month ago Pain Frequency: Constant Pain Relieving Factors: Hydrocodone  Pain Relieving Factors: Hydrocodone  Current Medications & Allergies (verified) Allergies as of 06/07/2019      Reactions   Gabapentin Other (See Comments)   Pt states that she can not take with antidepressants   Latex Rash      Medication List       Accurate as of June 07, 2019  9:56 AM. If you have any questions, ask your nurse or doctor.        Accu-Chek Aviva Plus w/Device Kit USE AS DIRECTED   Accu-Chek Aviva Soln 1 Units by In Vitro route every 30 (thirty) days.   Accu-Chek Softclix Lancets lancets Check glucose BID   aspirin EC 81 MG tablet Take 1 tablet (81 mg total) by mouth daily.   B-D SINGLE USE SWABS REGULAR Pads 1 Units by Does not apply route 2 (two) times daily.   DULoxetine 60 MG capsule Commonly known as: CYMBALTA Take 2 capsules (120 mg total) by mouth daily.   fluticasone 50 MCG/ACT nasal spray Commonly known as: FLONASE Place 1 spray into both nostrils daily.  glucose blood test strip Commonly known as: Accu-Chek Aviva Plus Check glucose BID   ipratropium-albuterol 0.5-2.5 (3) MG/3ML Soln Commonly known as: DUONEB INHALE THE CONTENTS OF 1 VIAL VIA NEBULIZER FOUR TIMES DAILY AS NEEDED   Lubricant Eye Drops 0.4-0.3 % Soln Generic drug: Polyethyl Glycol-Propyl Glycol Place 1 drop into both eyes daily.   macitentan 10 MG tablet Commonly known as: Opsumit Take 1 tablet (10 mg  total) by mouth daily.   OXYGEN Take 2.5-4 L by mouth See admin instructions. 2.5 L with resting state & 4 L with exertion [facility]   potassium chloride SA 20 MEQ tablet Commonly known as: K-DUR TAKE 1 TABLET TWICE DAILY   pravastatin 20 MG tablet Commonly known as: PRAVACHOL TAKE 1 TABLET EVERY DAY   Riociguat 2 MG Tabs Commonly known as: Adempas Take 2 mg by mouth 3 (three) times daily.   Selexipag 200 MCG Tabs Take 1 tablet (200 mcg total) by mouth 2 (two) times daily.   torsemide 20 MG tablet Commonly known as: DEMADEX Take 2 tablets (40 mg total) by mouth 2 (two) times daily.   umeclidinium-vilanterol 62.5-25 MCG/INH Aepb Commonly known as: Anoro Ellipta INHALE 1 PUFF INTO THE LUNGS DAILY.   Vitamin D (Ergocalciferol) 1.25 MG (50000 UT) Caps capsule Commonly known as: DRISDOL TAKE 1 CAPSULE EVERY MONDAY   zolpidem 5 MG tablet Commonly known as: AMBIEN Take 1 tablet (5 mg total) by mouth at bedtime.       History (reviewed): Past Medical History:  Diagnosis Date  . Allergy   . Depression   . GERD (gastroesophageal reflux disease)   . Hyperlipidemia   . Oxygen dependent   . Shortness of breath dyspnea   . Varicose veins    Past Surgical History:  Procedure Laterality Date  . CARDIAC CATHETERIZATION N/A 04/20/2015   Procedure: Right Heart Cath;  Surgeon: Larey Dresser, MD;  Location: [facility];  Service: Cardiovascular;  Laterality: N/A;  . RIGHT HEART CATH N/A 06/13/2017   Procedure: Right Heart Cath;  Surgeon: Larey Dresser, MD;  Location: [facility];  Service: Cardiovascular;  Laterality: N/A;  . RIGHT HEART CATH N/A 05/06/2018   Procedure: RIGHT HEART CATH;  Surgeon: Larey Dresser, MD;  Location: [facility];  Service: Cardiovascular;  Laterality: N/A;  . TEE WITHOUT CARDIOVERSION N/A 05/11/2015   Procedure: TRANSESOPHAGEAL ECHOCARDIOGRAM (TEE);  Surgeon: Larey Dresser, MD;  Location: [facility];  Service:  Cardiovascular;  Laterality: N/A;  . TUBAL LIGATION     11/84   Family History  Problem Relation Age of Onset  . Hyperlipidemia Mother   . Hypertension Mother   . Diabetes Mother   . Other Mother        varicose veins  . Hyperlipidemia Father   . Hypertension Father   . Emphysema Father        smoked  . Hypertension Daughter   . Asthma Sister   . Clotting disorder Daughter   . Breast cancer Maternal Grandmother   . Ovarian cancer Maternal Aunt    Social History   Socioeconomic History  . Marital status: Married    Spouse name: [name]  . Number of children: 3  . Years of education: Not on file  . Highest education level: 12th grade  Occupational History  . Occupation: Disabled  Social Needs  . Financial resource strain: Not hard at all  . Food insecurity    Worry: Never true  Inability: Never true  . Transportation needs    Medical: No    Non-medical: No  Tobacco Use  . Smoking status: Former Smoker    Packs/day: 0.25    Years: 17.00    Pack years: 4.25    Types: Cigarettes    Quit date: 03/02/2015    Years since quitting: 4.2  . Smokeless tobacco: Never Used  Substance and Sexual Activity  . Alcohol use: No    Alcohol/week: 0.0 standard drinks  . 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 Sexual activity: Not Currently  Lifestyle  . Physical activity    Days per week: 0 days    Minutes per session: 0 min  . Stress: Not at all  Relationships  . Social connections    Talks on phone: More than three times a week    Gets together: More than three times a week    Attends religious service: More than 4 times per year    Active member of club or organization: No    Attends meetings of clubs or organizations: Never    Relationship status: Married  Other Topics Concern  . Not on file  Social History Narrative  . Not on file    Activities of Daily Living In your present state of health, do you have any difficulty  performing the following activities: 06/07/2019  Hearing? N  Vision? N  Difficulty concentrating or making decisions? N  Walking or climbing stairs? Y  Comment Knee Pain  Dressing or bathing? N  Doing errands, shopping? N  Preparing Food and eating ? N  Using the Toilet? N  In the past six months, have you accidently leaked urine? N  Do you have problems with loss of bowel control? N  Managing your Medications? N  Managing your Finances? N  Housekeeping or managing your Housekeeping? N  Some recent data might be hidden    Patient Literacy How often do you need to have someone help you when you read instructions, pamphlets, or other written materials from your doctor or pharmacy?: 1 - Never What is the last grade level you completed in school?: 12th Grade  Exercise Current Exercise Habits: The patient does not participate in regular exercise at present, Exercise limited by: orthopedic condition(s)  Diet Patient reports consuming 2 meals a day and 2 snack(s) a day Patient reports that her primary diet is: Regular Patient reports that she does have regular access to food.   Depression Screen PHQ 2/9 Scores 06/07/2019 02/12/2019 11/13/2018 08/19/2018 06/02/2018 05/18/2018 02/20/2018  PHQ - 2 Score 0 '2 2 3 '$ 0 0 0  PHQ- 9 Score - '5 10 13 '$ - - -     Fall Risk Fall Risk  06/07/2019 08/19/2018 02/20/2018 04/17/2017 04/08/2017  Falls in the past year? 0 No No No No  Number falls in past yr: 0 - - - -  Injury with Fall? 0 - - - -  Risk for fall due to : - - - (No Data) -  Risk for fall due to: Comment - - - No history of falls -     Objective:  Kelly Donaldson seemed alert and oriented and she participated appropriately during our telephone visit.  Blood Pressure Weight BMI  BP Readings from Last 3 Encounters:  05/12/19 (!) 100/59  02/12/19 (!) 95/49  02/11/19 132/72   Wt Readings from Last 3 Encounters:  06/07/19 192 lb  9.6 oz (87.4 kg)  05/12/19 192 lb 9.6 oz (87.4 kg)  02/12/19 202 lb  9.6 oz (91.9 kg)   BMI Readings from Last 1 Encounters:  06/07/19 30.17 kg/m    *Unable to obtain current vital signs, weight, and BMI due to telephone visit type  Hearing/Vision  . Morrisa did not seem to have difficulty with hearing/understanding during the telephone conversation . Reports that she has not had a formal eye exam by an eye care professional within the past year . Reports that she has not had a formal hearing evaluation within the past year *Unable to fully assess hearing and vision during telephone visit type  Cognitive Function: 6CIT Screen 06/07/2019  What Year? 0 points  What month? 0 points  What time? 0 points  Count back from 20 0 points  Months in reverse 0 points  Repeat phrase 0 points  Total Score 0   (Normal:0-7, Significant for Dysfunction: >8)  Normal Cognitive Function Screening: Yes   Immunization & Health Maintenance Record Immunization History  Administered Date(s) Administered  . Influenza Split 09/08/2014, 09/20/2015, 09/08/2017  . Influenza,inj,Quad PF,6+ Mos 09/04/2016, 11/12/2018  . Influenza-Unspecified 10/13/2017    Health Maintenance  Topic Date Due  . Hepatitis C Screening  November 14, 1956  . TETANUS/TDAP  03/12/1975  . MAMMOGRAM  08/28/2017  . PAP SMEAR-Modifier  08/27/2018  . INFLUENZA VACCINE  07/10/2019  . COLONOSCOPY  06/20/2026  . HIV Screening  Completed       Assessment  This is a routine wellness examination for Kelly Donaldson.  Health Maintenance: Due or Overdue Health Maintenance Due  Topic Date Due  . Hepatitis C Screening  06/30/1956  . TETANUS/TDAP  03/12/1975  . MAMMOGRAM  08/28/2017  . PAP SMEAR-Modifier  08/27/2018    Kelly Donaldson does not need a referral for Community Assistance: Care Management:   no Social Work:    no Prescription Assistance:  no Nutrition/Diabetes Education:  no   Plan:  Personalized Goals Goals Addressed            This Visit's Progress   . Exercise 3x per week (30  min per time)       Try to exercise for at least 30 minutes, 3 time weekly    . Have 3 meals a day       Try to eat 3 meals daily that consist of lean proteins, fruits, and vegetables      Personalized Health Maintenance & Screening Recommendations  Td vaccine Screening mammography Screening Pap smear and pelvic exam  Shingrix  Lung Cancer Screening Recommended: no (Low Dose CT Chest recommended if Age 4-80 years, 30 pack-year currently smoking OR have quit w/in past 15 years) Hepatitis C Screening recommended: yes HIV Screening recommended: yes  Advanced Directives: Written information was prepared per patient's request.  Referrals & Orders No orders of the defined types were placed in this encounter.   Follow-up Plan . Follow-up with Terald Sleeper, PA-C as planned    I have personally reviewed and noted the following in the patient's chart:   . Medical and social history . Use of alcohol, tobacco or illicit drugs  . Current medications and supplements . Functional ability and status . Nutritional status . Physical activity . Advanced directives . List of other physicians . Hospitalizations, surgeries, and ER visits in previous 12 months . Vitals . Screenings to include cognitive, depression, and falls . Referrals and appointments  In addition, I have reviewed and discussed  with Kelly Donaldson certain preventive protocols, quality metrics, and best practice recommendations. A written personalized care plan for preventive services as well as general preventive health recommendations is available and can be mailed to the patient at her request.      Wardell Heath, LPN   [id number]

## 2019-06-07 NOTE — Patient Instructions (Signed)
Kelly Donaldson , Thank you for taking time to come for your Medicare Wellness Visit. I appreciate your ongoing commitment to your health goals. Please review the following plan we discussed and let me know if I can assist you in the future.   These are the goals we discussed: Goals    . Exercise 3x per week (30 min per time)     Try to exercise for at least 30 minutes, 3 time weekly    . Have 3 meals a day     Try to eat 3 meals daily that consist of lean proteins, fruits, and vegetables       This is a list of the screening recommended for you and due dates:  Health Maintenance  Topic Date Due  .  Hepatitis C: One time screening is recommended by Center for Disease Control  (CDC) for  adults born from [birth year] through 1965.   September 27, 1956  . Tetanus Vaccine  03/12/1975  . Mammogram  08/28/2017  . Pap Smear  08/27/2018  . Flu Shot  07/10/2019  . Colon Cancer Screening  06/20/2026  . HIV Screening  Completed        Advance Directive  Advance directives are legal documents that let you make choices ahead of time about your health care and medical treatment in case you become unable to communicate for yourself. Advance directives are a way for you to communicate your wishes to family, friends, and health care providers. This can help convey your decisions about end-of-life care if you become unable to communicate. Discussing and writing advance directives should happen over time rather than all at once. Advance directives can be changed depending on your situation and what you want, even after you have signed the advance directives. If you do not have an advance directive, some states assign family decision makers to act on your behalf based on how closely you are related to them. Each state has its own laws regarding advance directives. You may want to check with your health care provider, attorney, or state representative about the laws in your state. There are different types of advance  directives, such as:  Medical power of attorney.  Living will.  Do not resuscitate (DNR) or do not attempt resuscitation (DNAR) order. Health care proxy and medical power of attorney A health care proxy, also called a health care agent, is a person who is appointed to make medical decisions for you in cases in which you are unable to make the decisions yourself. Generally, people choose someone they know well and trust to represent their preferences. Make sure to ask this person for an agreement to act as your proxy. A proxy may have to exercise judgment in the event of a medical decision for which your wishes are not known. A medical power of attorney is a legal document that names your health care proxy. Depending on the laws in your state, after the document is written, it may also need to be:  Signed.  Notarized.  Dated.  Copied.  Witnessed.  Incorporated into your medical record. You may also want to appoint someone to manage your financial affairs in a situation in which you are unable to do so. This is called a durable power of attorney for finances. It is a separate legal document from the durable power of attorney for health care. You may choose the same person or someone different from your health care proxy to act as your agent in financial matters. If  you do not appoint a proxy, or if there is a concern that the proxy is not acting in your best interests, a court-appointed guardian may be designated to act on your behalf. Living will A living will is a set of instructions documenting your wishes about medical care when you cannot express them yourself. Health care providers should keep a copy of your living will in your medical record. You may want to give a copy to family members or friends. To alert caregivers in case of an emergency, you can place a card in your wallet to let them know that you have a living will and where they can find it. A living will is used if you become:   Terminally ill.  Incapacitated.  Unable to communicate or make decisions. Items to consider in your living will include:  The use or non-use of life-sustaining equipment, such as dialysis machines and breathing machines (ventilators).  A DNR or DNAR order, which is the instruction not to use cardiopulmonary resuscitation (CPR) if breathing or heartbeat stops.  The use or non-use of tube feeding.  Withholding of food and fluids.  Comfort (palliative) care when the goal becomes comfort rather than a cure.  Organ and tissue donation. A living will does not give instructions for distributing your money and property if you should pass away. It is recommended that you seek the advice of a lawyer when writing a will. Decisions about taxes, beneficiaries, and asset distribution will be legally binding. This process can relieve your family and friends of any concerns surrounding disputes or questions that may come up about the distribution of your assets. DNR or DNAR A DNR or DNAR order is a request not to have CPR in the event that your heart stops beating or you stop breathing. If a DNR or DNAR order has not been made and shared, a health care provider will try to help any patient whose heart has stopped or who has stopped breathing. If you plan to have surgery, talk with your health care provider about how your DNR or DNAR order will be followed if problems occur. Summary  Advance directives are the legal documents that allow you to make choices ahead of time about your health care and medical treatment in case you become unable to communicate for yourself.  The process of discussing and writing advance directives should happen over time. You can change the advance directives, even after you have signed them.  Advance directives include DNR or DNAR orders, living wills, and designating an agent as your medical power of attorney. This information is not intended to replace advice given to you  by your health care provider. Make sure you discuss any questions you have with your health care provider. Document Released: 03/03/2008 Document Revised: 12/30/2018 Document Reviewed: 10/14/2016 Elsevier Patient Education  2020 Elsevier Inc.    BMI for Adults  Body mass index (BMI) is a number that is calculated from a person's weight and height. BMI may help to estimate how much of a person's weight is composed of fat. BMI can help identify those who may be at higher risk for certain medical problems. How is BMI used with adults? BMI is used as a screening tool to identify possible weight problems. It is used to check whether a person is obese, overweight, healthy weight, or underweight. How is BMI calculated? BMI measures your weight and compares it to your height. This can be done either in [place] ([place] or metric measurements. Note  that charts are available to help you find your BMI quickly and easily without having to do these calculations yourself. To calculate your BMI in [place] ([place] measurements, your health care provider will: 1. Measure your weight in pounds (lb). 2. Multiply the number of pounds by 703. ? For example, for a person who weighs 180 lb, multiply that number by 703, which equals 126,540. 3. Measure your height in inches (in). Then multiply that number by itself to get a measurement called "inches squared." ? For example, for a person who is 70 in tall, the "inches squared" measurement is 70 in x 70 in, which equals 4900 inches squared. 4. Divide the total from Step 2 (number of lb x 703) by the total from Step 3 (inches squared): 126,540  4900 = 25.8. This is your BMI. To calculate your BMI in metric measurements, your health care provider will: 1. Measure your weight in kilograms (kg). 2. Measure your height in meters (m). Then multiply that number by itself to get a measurement called "meters squared." ? For example, for a person who is 1.75 m tall, the "meters  squared" measurement is 1.75 m x 1.75 m, which is equal to 3.1 meters squared. 3. Divide the number of kilograms (your weight) by the meters squared number. In this example: 70  3.1 = 22.6. This is your BMI. How is BMI interpreted? To interpret your results, your health care provider will use BMI charts to identify whether you are underweight, normal weight, overweight, or obese. The following guidelines will be used:  Underweight: BMI less than 18.5.  Normal weight: BMI between 18.5 and 24.9.  Overweight: BMI between 25 and 29.9.  Obese: BMI of 30 and above. Please note:  Weight includes both fat and muscle, so someone with a muscular build, such as an athlete, may have a BMI that is higher than 24.9. In cases like these, BMI is not an accurate measure of body fat.  To determine if excess body fat is the cause of a BMI of 25 or higher, further assessments may need to be done by a health care provider.  BMI is usually interpreted in the same way for men and women. Why is BMI a useful tool? BMI is useful in two ways:  Identifying a weight problem that may be related to a medical condition, or that may increase the risk for medical problems.  Promoting lifestyle and diet changes in order to reach a healthy weight. Summary  Body mass index (BMI) is a number that is calculated from a person's weight and height.  BMI may help to estimate how much of a person's weight is composed of fat. BMI can help identify those who may be at higher risk for certain medical problems.  BMI can be measured using English measurements or metric measurements.  To interpret your results, your health care provider will use BMI charts to identify whether you are underweight, normal weight, overweight, or obese. This information is not intended to replace advice given to you by your health care provider. Make sure you discuss any questions you have with your health care provider. Document Released: 08/06/2004  Document Revised: 11/07/2017 Document Reviewed: 10/08/2017 Elsevier Patient Education  2020 [employer].

## 2019-06-10 DIAGNOSIS — R5383 Other fatigue: Secondary | ICD-10-CM | POA: Diagnosis not present

## 2019-06-10 DIAGNOSIS — Z7901 Long term (current) use of anticoagulants: Secondary | ICD-10-CM | POA: Diagnosis not present

## 2019-06-10 DIAGNOSIS — D539 Nutritional anemia, unspecified: Secondary | ICD-10-CM | POA: Diagnosis not present

## 2019-06-16 ENCOUNTER — Ambulatory Visit: Payer: Medicare PPO

## 2019-06-16 ENCOUNTER — Other Ambulatory Visit: Payer: Self-pay

## 2019-06-17 DIAGNOSIS — J449 Chronic obstructive pulmonary disease, unspecified: Secondary | ICD-10-CM | POA: Diagnosis not present

## 2019-06-24 DIAGNOSIS — Z79899 Other long term (current) drug therapy: Secondary | ICD-10-CM | POA: Diagnosis not present

## 2019-06-24 DIAGNOSIS — D869 Sarcoidosis, unspecified: Secondary | ICD-10-CM | POA: Diagnosis not present

## 2019-06-24 DIAGNOSIS — D509 Iron deficiency anemia, unspecified: Secondary | ICD-10-CM | POA: Diagnosis not present

## 2019-07-02 DIAGNOSIS — D869 Sarcoidosis, unspecified: Secondary | ICD-10-CM | POA: Diagnosis not present

## 2019-07-02 DIAGNOSIS — D500 Iron deficiency anemia secondary to blood loss (chronic): Secondary | ICD-10-CM | POA: Diagnosis not present

## 2019-07-02 DIAGNOSIS — K635 Polyp of colon: Secondary | ICD-10-CM | POA: Diagnosis not present

## 2019-07-02 DIAGNOSIS — I509 Heart failure, unspecified: Secondary | ICD-10-CM | POA: Diagnosis not present

## 2019-07-02 DIAGNOSIS — I2729 Other secondary pulmonary hypertension: Secondary | ICD-10-CM | POA: Diagnosis not present

## 2019-07-09 DIAGNOSIS — D500 Iron deficiency anemia secondary to blood loss (chronic): Secondary | ICD-10-CM | POA: Diagnosis not present

## 2019-07-09 DIAGNOSIS — R634 Abnormal weight loss: Secondary | ICD-10-CM | POA: Diagnosis not present

## 2019-07-15 DIAGNOSIS — I509 Heart failure, unspecified: Secondary | ICD-10-CM | POA: Diagnosis not present

## 2019-07-15 DIAGNOSIS — D500 Iron deficiency anemia secondary to blood loss (chronic): Secondary | ICD-10-CM | POA: Diagnosis not present

## 2019-07-15 DIAGNOSIS — J849 Interstitial pulmonary disease, unspecified: Secondary | ICD-10-CM | POA: Diagnosis not present

## 2019-07-15 DIAGNOSIS — I2729 Other secondary pulmonary hypertension: Secondary | ICD-10-CM | POA: Diagnosis not present

## 2019-07-15 DIAGNOSIS — D869 Sarcoidosis, unspecified: Secondary | ICD-10-CM | POA: Diagnosis not present

## 2019-07-16 DIAGNOSIS — Z1159 Encounter for screening for other viral diseases: Secondary | ICD-10-CM | POA: Diagnosis not present

## 2019-07-18 DIAGNOSIS — J449 Chronic obstructive pulmonary disease, unspecified: Secondary | ICD-10-CM | POA: Diagnosis not present

## 2019-07-19 DIAGNOSIS — I509 Heart failure, unspecified: Secondary | ICD-10-CM | POA: Diagnosis not present

## 2019-07-19 DIAGNOSIS — D649 Anemia, unspecified: Secondary | ICD-10-CM | POA: Diagnosis not present

## 2019-07-19 DIAGNOSIS — K449 Diaphragmatic hernia without obstruction or gangrene: Secondary | ICD-10-CM | POA: Diagnosis not present

## 2019-07-19 DIAGNOSIS — G8929 Other chronic pain: Secondary | ICD-10-CM | POA: Diagnosis not present

## 2019-07-19 DIAGNOSIS — K644 Residual hemorrhoidal skin tags: Secondary | ICD-10-CM | POA: Diagnosis not present

## 2019-07-19 DIAGNOSIS — J449 Chronic obstructive pulmonary disease, unspecified: Secondary | ICD-10-CM | POA: Diagnosis not present

## 2019-07-19 DIAGNOSIS — Z9981 Dependence on supplemental oxygen: Secondary | ICD-10-CM | POA: Diagnosis not present

## 2019-07-19 DIAGNOSIS — I110 Hypertensive heart disease with heart failure: Secondary | ICD-10-CM | POA: Diagnosis not present

## 2019-07-19 DIAGNOSIS — D509 Iron deficiency anemia, unspecified: Secondary | ICD-10-CM | POA: Diagnosis not present

## 2019-07-19 DIAGNOSIS — D500 Iron deficiency anemia secondary to blood loss (chronic): Secondary | ICD-10-CM | POA: Diagnosis not present

## 2019-07-19 DIAGNOSIS — K2970 Gastritis, unspecified, without bleeding: Secondary | ICD-10-CM | POA: Diagnosis not present

## 2019-07-19 DIAGNOSIS — R634 Abnormal weight loss: Secondary | ICD-10-CM | POA: Diagnosis not present

## 2019-07-22 DIAGNOSIS — D500 Iron deficiency anemia secondary to blood loss (chronic): Secondary | ICD-10-CM | POA: Diagnosis not present

## 2019-07-23 DIAGNOSIS — E611 Iron deficiency: Secondary | ICD-10-CM | POA: Diagnosis not present

## 2019-07-23 DIAGNOSIS — Z79899 Other long term (current) drug therapy: Secondary | ICD-10-CM | POA: Diagnosis not present

## 2019-07-23 DIAGNOSIS — D869 Sarcoidosis, unspecified: Secondary | ICD-10-CM | POA: Diagnosis not present

## 2019-08-05 ENCOUNTER — Other Ambulatory Visit: Payer: Self-pay | Admitting: Physician Assistant

## 2019-08-05 DIAGNOSIS — D869 Sarcoidosis, unspecified: Secondary | ICD-10-CM

## 2019-08-09 NOTE — Telephone Encounter (Signed)
Refill sent to pharmacy.   

## 2019-08-13 ENCOUNTER — Encounter (HOSPITAL_COMMUNITY): Payer: Self-pay | Admitting: *Deleted

## 2019-08-17 ENCOUNTER — Other Ambulatory Visit: Payer: Self-pay

## 2019-08-17 ENCOUNTER — Ambulatory Visit (INDEPENDENT_AMBULATORY_CARE_PROVIDER_SITE_OTHER): Payer: Medicare PPO | Admitting: Physician Assistant

## 2019-08-17 ENCOUNTER — Encounter: Payer: Self-pay | Admitting: Physician Assistant

## 2019-08-17 VITALS — BP 89/67 | HR 66 | Temp 98.0°F | Ht 67.0 in | Wt 190.2 lb

## 2019-08-17 DIAGNOSIS — I2729 Other secondary pulmonary hypertension: Secondary | ICD-10-CM

## 2019-08-17 DIAGNOSIS — D509 Iron deficiency anemia, unspecified: Secondary | ICD-10-CM

## 2019-08-17 DIAGNOSIS — D869 Sarcoidosis, unspecified: Secondary | ICD-10-CM

## 2019-08-17 MED ORDER — TORSEMIDE 20 MG PO TABS: 40.0000 mg | ORAL_TABLET | Freq: Every day | ORAL | 3 refills | Status: DC

## 2019-08-17 MED ORDER — IPRATROPIUM-ALBUTEROL 0.5-2.5 (3) MG/3ML IN SOLN: RESPIRATORY_TRACT | 4 refills | Status: DC

## 2019-08-17 NOTE — Progress Notes (Signed)
BP (!) 89/67   Pulse 66   Temp 98 F (36.7 C) (Temporal)   Ht '5\' 7"'$  (1.702 m)   Wt 190 lb 3.2 oz (86.3 kg)   SpO2 95% Comment: 2Liters via nasal canula  BMI 29.79 kg/m    Subjective:    Patient ID: Kelly Donaldson, female    DOB: [birth year], 63 y.o.   MRN: [id number]  HPI: Kelly Donaldson is a 63 y.o. female presenting on 08/17/2019 for Medical Management of Chronic Issues (3 month ) and Hypertension This patient comes in for recheck on her chronic medical conditions.  She pulmonary hypertension which she is under treatment with pulmonology and cardiology.  Patient also has depression, GERD, hyperlipidemia, chronic pain related to degenerative disc disease.  She was seen by her pain specialist and found to have a very low hemoglobin.  Since then she has been through an upper GI and colonoscopy which was negative.  She was given iron orally and an infusion.  She states that overall she is feeling better.  We will have labs performed today.  All of her medications are reviewed today.  We will plan her to come back in 3 months.  She reports that the EGD and colonoscopy were performed through [facility]   Past Medical History:  Diagnosis Date  . Allergy   . Depression   . GERD (gastroesophageal reflux disease)   . Hyperlipidemia   . Oxygen dependent   . Shortness of breath dyspnea   . Varicose veins    Relevant past medical, surgical, family and social history reviewed and updated as indicated. Interim medical history since our last visit reviewed. Allergies and medications reviewed and updated. DATA REVIEWED: CHART IN EPIC  Family History reviewed for pertinent findings.  Review of Systems  Constitutional: Positive for fatigue.  HENT: Negative.   Eyes: Negative.   Respiratory: Positive for shortness of breath. Negative for wheezing.   Cardiovascular: Negative.  Negative for palpitations and leg swelling.  Gastrointestinal: Negative.   Genitourinary:  Negative.   Musculoskeletal: Positive for arthralgias and back pain.    Allergies as of 08/17/2019      Reactions   Gabapentin Other (See Comments)   Pt states that she can not take with antidepressants   Latex Rash      Medication List       Accurate as of August 17, 2019  1:21 PM. If you have any questions, ask your nurse or doctor.        Accu-Chek Aviva Plus w/Device Kit USE AS DIRECTED   Accu-Chek Aviva Soln 1 Units by In Vitro route every 30 (thirty) days.   Accu-Chek Softclix Lancets lancets Check glucose BID   aspirin EC 81 MG tablet Take 1 tablet (81 mg total) by mouth daily.   B-D SINGLE USE SWABS REGULAR Pads 1 Units by Does not apply route 2 (two) times daily.   DULoxetine 60 MG capsule Commonly known as: CYMBALTA Take 2 capsules (120 mg total) by mouth daily.   FerrouSul 325 (65 FE) MG tablet Generic drug: ferrous sulfate Take by mouth.   fluticasone 50 MCG/ACT nasal spray Commonly known as: FLONASE Place 1 spray into both nostrils daily.   glucose blood test strip Commonly known as: Accu-Chek Aviva Plus Check glucose BID   ipratropium-albuterol 0.5-2.5 (3) MG/3ML Soln Commonly known as: DUONEB INHALE THE CONTENTS OF 1 VIAL VIA NEBULIZER FOUR TIMES DAILY AS NEEDED   Lubricant Eye Drops 0.4-0.3 %  Soln Generic drug: Polyethyl Glycol-Propyl Glycol Place 1 drop into both eyes daily.   macitentan 10 MG tablet Commonly known as: Opsumit Take 1 tablet (10 mg total) by mouth daily.   OXYGEN Take 2.5-4 L by mouth See admin instructions. 2.5 L with resting state & 4 L with exertion [facility]   potassium chloride SA 20 MEQ tablet Commonly known as: K-DUR TAKE 1 TABLET TWICE DAILY   pravastatin 20 MG tablet Commonly known as: PRAVACHOL TAKE 1 TABLET EVERY DAY   Riociguat 2 MG Tabs Commonly known as: Adempas Take 2 mg by mouth 3 (three) times daily.   Selexipag 200 MCG Tabs Take 1 tablet (200 mcg total) by mouth 2 (two) times daily.    torsemide 20 MG tablet Commonly known as: DEMADEX Take 2 tablets (40 mg total) by mouth daily.   umeclidinium-vilanterol 62.5-25 MCG/INH Aepb Commonly known as: Anoro Ellipta INHALE 1 PUFF INTO THE LUNGS DAILY.   Vitamin D (Ergocalciferol) 1.25 MG (50000 UT) Caps capsule Commonly known as: DRISDOL TAKE 1 CAPSULE EVERY MONDAY   zolpidem 5 MG tablet Commonly known as: AMBIEN Take 1 tablet (5 mg total) by mouth at bedtime.          Objective:    BP (!) 89/67   Pulse 66   Temp 98 F (36.7 C) (Temporal)   Ht '5\' 7"'$  (1.702 m)   Wt 190 lb 3.2 oz (86.3 kg)   SpO2 95% Comment: 2Liters via nasal canula  BMI 29.79 kg/m   Allergies  Allergen Reactions  . Gabapentin Other (See Comments)    Pt states that she can not take with antidepressants  . Latex Rash    Wt Readings from Last 3 Encounters:  08/17/19 190 lb 3.2 oz (86.3 kg)  06/07/19 192 lb 9.6 oz (87.4 kg)  05/12/19 192 lb 9.6 oz (87.4 kg)    Physical Exam Constitutional:      General: She is not in acute distress.    Appearance: Normal appearance. She is well-developed.  HENT:     Head: Normocephalic and atraumatic.  Cardiovascular:     Rate and Rhythm: Normal rate.  Pulmonary:     Effort: Pulmonary effort is normal.  Skin:    General: Skin is warm and dry.     Findings: No rash.  Neurological:     Mental Status: She is alert and oriented to person, place, and time.     Deep Tendon Reflexes: Reflexes are normal and symmetric.     Results for orders placed or performed in visit on 05/12/19  Lipid panel  Result Value Ref Range   Cholesterol, Total 168 100 - 199 mg/dL   Triglycerides 78 0 - 149 mg/dL   HDL 74 >39 mg/dL   VLDL Cholesterol Cal 16 5 - 40 mg/dL   LDL Calculated 78 0 - 99 mg/dL   Chol/HDL Ratio 2.3 0.0 - 4.4 ratio  CBC with Differential/Platelet  Result Value Ref Range   WBC 10.4 3.4 - 10.8 x10E3/uL   RBC 4.50 3.77 - 5.28 x10E6/uL   Hemoglobin 7.4 (LL) 11.1 - 15.9 g/dL   Hematocrit 28.3 (L)  34.0 - 46.6 %   MCV 63 (L) 79 - 97 fL   MCH 16.4 (L) 26.6 - 33.0 pg   MCHC 26.1 (L) 31.5 - 35.7 g/dL   RDW 17.8 (H) 11.7 - 15.4 %   Platelets 291 150 - 450 x10E3/uL   Neutrophils 65 Not Estab. %   Lymphs 20 Not Estab. %  Monocytes 6 Not Estab. %   Eos 8 Not Estab. %   Basos 1 Not Estab. %   Neutrophils Absolute 6.7 1.4 - 7.0 x10E3/uL   Lymphocytes Absolute 2.1 0.7 - 3.1 x10E3/uL   Monocytes Absolute 0.7 0.1 - 0.9 x10E3/uL   EOS (ABSOLUTE) 0.8 (H) 0.0 - 0.4 x10E3/uL   Basophils Absolute 0.1 0.0 - 0.2 x10E3/uL   Immature Granulocytes 0 Not Estab. %   Immature Grans (Abs) 0.0 0.0 - 0.1 x10E3/uL  CMP14+EGFR  Result Value Ref Range   Glucose 116 (H) 65 - 99 mg/dL   BUN 16 8 - 27 mg/dL   Creatinine, Ser 1.04 (H) 0.57 - 1.00 mg/dL   GFR calc non Af Amer 57 (L) >59 mL/min/1.73   GFR calc Af Amer 66 >59 mL/min/1.73   BUN/Creatinine Ratio 15 12 - 28   Sodium 139 134 - 144 mmol/L   Potassium 4.0 3.5 - 5.2 mmol/L   Chloride 99 96 - 106 mmol/L   CO2 28 20 - 29 mmol/L   Calcium 9.0 8.7 - 10.3 mg/dL   Total Protein 7.1 6.0 - 8.5 g/dL   Albumin 4.3 3.8 - 4.8 g/dL   Globulin, Total 2.8 1.5 - 4.5 g/dL   Albumin/Globulin Ratio 1.5 1.2 - 2.2   Bilirubin Total 0.3 0.0 - 1.2 mg/dL   Alkaline Phosphatase 62 39 - 117 IU/L   AST 14 0 - 40 IU/L   ALT 8 0 - 32 IU/L  TSH  Result Value Ref Range   TSH 1.590 0.450 - 4.500 uIU/mL      Assessment & Plan:   1. Pulmonary hypertension associated with sarcoidosis (HCC) - ipratropium-albuterol (DUONEB) 0.5-2.5 (3) MG/3ML SOLN; INHALE THE CONTENTS OF 1 VIAL VIA NEBULIZER FOUR TIMES DAILY AS NEEDED  Dispense: 360 mL; Refill: 4 - torsemide (DEMADEX) 20 MG tablet; Take 2 tablets (40 mg total) by mouth daily.  Dispense: 180 tablet; Refill: 3  2. Iron deficiency anemia, unspecified iron deficiency anemia type - ferrous sulfate (FERROUSUL) 325 (65 FE) MG tablet; Take by mouth. - Anemia Profile B   Continue all other maintenance medications as listed  above.  Follow up plan: Return in about 3 months (around 11/16/2019).  Educational handout given for [place] PA-C [facility] [address]  [place], [place] [address] [phone]   08/17/2019, 1:21 PM

## 2019-08-18 LAB — ANEMIA PROFILE B
Basophils Absolute: 0 10*3/uL (ref 0.0–0.2)
Basos: 1 %
EOS (ABSOLUTE): 1 10*3/uL — ABNORMAL HIGH (ref 0.0–0.4)
Eos: 11 %
Ferritin: 145 ng/mL (ref 15–150)
Folate: 9.3 ng/mL (ref >3.0)
Hematocrit: 39 % (ref 34.0–46.6)
Hemoglobin: 11.3 g/dL (ref 11.1–15.9)
Immature Grans (Abs): 0 10*3/uL (ref 0.0–0.1)
Immature Granulocytes: 0 %
Iron Saturation: 22 % (ref 15–55)
Iron: 62 ug/dL (ref 27–139)
Lymphocytes Absolute: 1.8 10*3/uL (ref 0.7–3.1)
Lymphs: 20 %
MCH: 22.4 pg — ABNORMAL LOW (ref 26.6–33.0)
MCHC: 29 g/dL — ABNORMAL LOW (ref 31.5–35.7)
MCV: 77 fL — ABNORMAL LOW (ref 79–97)
Monocytes Absolute: 0.5 10*3/uL (ref 0.1–0.9)
Monocytes: 5 %
Neutrophils Absolute: 5.5 10*3/uL (ref 1.4–7.0)
Neutrophils: 63 %
Platelets: 163 10*3/uL (ref 150–450)
RBC: 5.05 x10E6/uL (ref 3.77–5.28)
Retic Ct Pct: 0.5 % — ABNORMAL LOW (ref 0.6–2.6)
Total Iron Binding Capacity: 286 ug/dL (ref 250–450)
UIBC: 224 ug/dL (ref 118–369)
Vitamin B-12: 438 pg/mL (ref 232–1245)
WBC: 8.8 10*3/uL (ref 3.4–10.8)

## 2019-08-20 ENCOUNTER — Encounter (HOSPITAL_COMMUNITY): Payer: Self-pay | Admitting: Cardiology

## 2019-08-20 ENCOUNTER — Other Ambulatory Visit: Payer: Self-pay

## 2019-08-20 ENCOUNTER — Ambulatory Visit (HOSPITAL_COMMUNITY)
Admission: RE | Admit: 2019-08-20 | Discharge: 2019-08-20 | Disposition: A | Discharge status: Home / Self Care | Payer: Medicare PPO | Source: Ambulatory Visit | Attending: Cardiology | Admitting: Cardiology

## 2019-08-20 ENCOUNTER — Ambulatory Visit (HOSPITAL_BASED_OUTPATIENT_CLINIC_OR_DEPARTMENT_OTHER)
Admission: RE | Admit: 2019-08-20 | Discharge: 2019-08-20 | Disposition: A | Discharge status: Home / Self Care | Payer: Medicare PPO | Source: Ambulatory Visit | Attending: Cardiology | Admitting: Cardiology

## 2019-08-20 VITALS — BP 110/65 | HR 93 | Wt 192.0 lb

## 2019-08-20 DIAGNOSIS — Z888 Allergy status to other drugs, medicaments and biological substances status: Secondary | ICD-10-CM | POA: Insufficient documentation

## 2019-08-20 DIAGNOSIS — E785 Hyperlipidemia, unspecified: Secondary | ICD-10-CM | POA: Diagnosis not present

## 2019-08-20 DIAGNOSIS — I5032 Chronic diastolic (congestive) heart failure: Secondary | ICD-10-CM | POA: Insufficient documentation

## 2019-08-20 DIAGNOSIS — Z833 Family history of diabetes mellitus: Secondary | ICD-10-CM | POA: Insufficient documentation

## 2019-08-20 DIAGNOSIS — D869 Sarcoidosis, unspecified: Secondary | ICD-10-CM | POA: Diagnosis not present

## 2019-08-20 DIAGNOSIS — Z7951 Long term (current) use of inhaled steroids: Secondary | ICD-10-CM | POA: Diagnosis not present

## 2019-08-20 DIAGNOSIS — Z9981 Dependence on supplemental oxygen: Secondary | ICD-10-CM | POA: Diagnosis not present

## 2019-08-20 DIAGNOSIS — I872 Venous insufficiency (chronic) (peripheral): Secondary | ICD-10-CM | POA: Insufficient documentation

## 2019-08-20 DIAGNOSIS — Z79899 Other long term (current) drug therapy: Secondary | ICD-10-CM | POA: Diagnosis not present

## 2019-08-20 DIAGNOSIS — J849 Interstitial pulmonary disease, unspecified: Secondary | ICD-10-CM | POA: Insufficient documentation

## 2019-08-20 DIAGNOSIS — Z803 Family history of malignant neoplasm of breast: Secondary | ICD-10-CM | POA: Insufficient documentation

## 2019-08-20 DIAGNOSIS — Z825 Family history of asthma and other chronic lower respiratory diseases: Secondary | ICD-10-CM | POA: Diagnosis not present

## 2019-08-20 DIAGNOSIS — I2721 Secondary pulmonary arterial hypertension: Secondary | ICD-10-CM | POA: Insufficient documentation

## 2019-08-20 DIAGNOSIS — Z8249 Family history of ischemic heart disease and other diseases of the circulatory system: Secondary | ICD-10-CM | POA: Diagnosis not present

## 2019-08-20 DIAGNOSIS — Z87891 Personal history of nicotine dependence: Secondary | ICD-10-CM | POA: Insufficient documentation

## 2019-08-20 DIAGNOSIS — Z7982 Long term (current) use of aspirin: Secondary | ICD-10-CM | POA: Diagnosis not present

## 2019-08-20 DIAGNOSIS — I2729 Other secondary pulmonary hypertension: Secondary | ICD-10-CM | POA: Diagnosis not present

## 2019-08-20 DIAGNOSIS — J9611 Chronic respiratory failure with hypoxia: Secondary | ICD-10-CM | POA: Diagnosis not present

## 2019-08-20 DIAGNOSIS — Z91040 Latex allergy status: Secondary | ICD-10-CM | POA: Diagnosis not present

## 2019-08-20 LAB — BASIC METABOLIC PANEL
Anion gap: 9 (ref 5–15)
BUN: 17 mg/dL (ref 8–23)
CO2: 29 mmol/L (ref 22–32)
Calcium: 8.6 mg/dL — ABNORMAL LOW (ref 8.9–10.3)
Chloride: 100 mmol/L (ref 98–111)
Creatinine, Ser: 1.08 mg/dL — ABNORMAL HIGH (ref 0.44–1.00)
GFR calc Af Amer: >60 mL/min (ref >60)
GFR calc non Af Amer: 55 mL/min — ABNORMAL LOW (ref >60)
Glucose, Bld: 95 mg/dL (ref 70–99)
Potassium: 3.8 mmol/L (ref 3.5–5.1)
Sodium: 138 mmol/L (ref 135–145)

## 2019-08-20 LAB — LIPID PANEL
Cholesterol: 194 mg/dL (ref 0–200)
HDL: 77 mg/dL (ref >40)
LDL Cholesterol: 97 mg/dL (ref 0–99)
Total CHOL/HDL Ratio: 2.5 RATIO
Triglycerides: 100 mg/dL (ref <150)
VLDL: 20 mg/dL (ref 0–40)

## 2019-08-20 LAB — BRAIN NATRIURETIC PEPTIDE: B Natriuretic Peptide: 32.4 pg/mL (ref 0.0–100.0)

## 2019-08-20 NOTE — Patient Instructions (Signed)
No medication changes today!  Labs today  We will only contact you if something comes back abnormal or we need to make some changes. Otherwise no news is good news!  Your physician recommends that you schedule a follow-up appointment in: 3 months with Dr Mclean   At the [facility], you and your health needs are our priority. As part of our continuing mission to provide you with exceptional heart care, we have created designated Provider Care Teams. These Care Teams include your primary Cardiologist (physician) and Advanced Practice Providers (APPs- Physician Assistants and Nurse Practitioners) who all work together to provide you with the care you need, when you need it.   You may see any of the following providers on your designated Care Team at your next follow up: . Dr Daniel Bensimhon . Dr Dalton McLean . Amy Clegg, NP   Please be sure to bring in all your medications bottles to every appointment.    

## 2019-08-20 NOTE — Progress Notes (Signed)
Echocardiogram 2D Echocardiogram has been performed.  Oneal Deputy Heraclio Seidman 08/20/2019, 1:46 PM

## 2019-08-22 NOTE — Progress Notes (Signed)
Date:  08/22/2019   ID:  Kelly Donaldson, DOB [birth year], MRN [id number]  Provider location: [facility] Type of Visit: Established patient   PCP:  Terald Sleeper, PA-C  Cardiologist:  Dr. [name]  Chief Complaint: Shortness of breath.   History of Present Illness: Kelly Donaldson is a 63 y.o. female who has a history of chronic hypoxemic respiratory failure and pulmonary hypertension.  She has been followed by Dr Melvyn Novas, now follows with [name] with pulmonology.  Has been on home oxygen for several years now.  She quit smoking in 2016.  She had an echo done in 5/16 showing dilated RV with severe pulmonary hypertension.  She therefore had RHC in 5/16 which confirmed severe pulmonary hypertension and RV failure.  PFTs showed only mild obstruction and restriction with markedly decreased DLCO suggestive of pulmonary vascular disease.  There was concern for sarcoidosis on CT chest but lymph node biopsy showed no evidence for sarcoidosis.  V/Q scan showed no evidence for chronic PE.   She has not tolerated Revatio or Adcirca.  Have failed previous up-titration of Selexipag with pelvic pain, can tolerate 200 mcg bid.  She is now tolerating riociguat 2 mg tid.   Started torsemide in Oct. 2017, has had better diuresis.   Due to ongoing dyspnea, she had repeat RHC in 5/19 that showed normal filling pressures and well-controlled pulmonary hypertension.  Echo in 6/19 showed normal LV EF, normal RV systolic function, and mild RV dilation.   She now is on Anoro, feels like this has helped her breathing.    Echo was done today and reviewed, EF 55-60%, mild RV dilation with normal systolic function, PASP 26 mmHg, IVC normal.   She returns for followup of pulmonary HTN.  Breathing is somewhat improved.  She is short of breath after walking 75-100 yards.  She can now make up her bed and vacuum the house.  No lightheadedness.  No chest pain.  No orthopnea/PND.  She is wearing flip  flops so does not want to do 6 minute walk.  Her weight is down 16 lbs. Recently found to have Fe deficiency anemia and has had IV Fe.  EGD/[facility] at [facility] were negative.   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   6 minute walk (5/16): 238 meters => decreased oxygen saturation to 73%, increased to 6 L [place] 6 minute walk (7/16): 170 meters => unable to complete.  6 minute walk (9/16): 201 meters 6 minute walk (1/17): 256 meters 6 minute walk (4/17): 219 meters 6 minute walk (8/17): 225 meters 6 minute walk (11/17): 244 meters 6 minute walk (5/18): 229 meters 6 minute walk (2/19): 122 meters (but she was stopped halfway through with drop in oxygen saturation).   Labs (2/16): BNP 69 Labs (5/16): LFTs normal, HCT 34.9, RF negative, HIV negative, TSH negative Labs (6/16): K 4.4, creatinine 0.88 Labs (7/16): ANA negative, BNP 58, K 4.3, creatinine 1.1, anti-RNP negative Labs (9/16): K 4.4, creatinine 1.26, BNP 91 Labs (10/16): K 4.3, creatinine 1.04 Labs (12/16): K 4.2, creatinine 1.07, BNP 130 Labs (1/17): K 4.3, creatinine 1.23, BNP 42 Labs (3/17): K 4.2, creatinine 1.19 Labs (5/17): K 4.3, creatinine 1.35, BNP 44 Labs (6/17): K 4.4, creatinine 1.18, BNP 44 Labs (10/17): K 4.2, creatinine 0.98, BNP 43 Labs (12/17): K 4.5, creatinine 1.23. BNP 64.5 Labs (2/18): K 4.5, creatinine 1.21, BNP 64 Labs (4/18): K 4.1, creatinine 1.24, BNP 26 Labs (9/18): K  4.6, creatinine 1.25 Labs (2/19): K 4.2, creatinine 1.39 Labs (5/19): K 4, creatinine 1.4, hgb 8.4 Labs (12/19): K 4.3, creatinine 1.15 Labs (6/20): TSH normal, K 4, creatinine 1.04, LDL 78 . PMH: 1. Pulmonary hypertension: RHC (5/16) with mean RA 18, PA 83/33 mean 53, mean PCWP 19, CI 2.61, PVR 5.8 WU.   Echo (5/16) with EF 65-70%, septal flattening, dilated RV with PA systolic pressure 99 mmHg, +bubble study.  PFTs (4/16) with FVC 76%, FEV1 75%, ratio 97%, TLC 66%, DLCO 26% => mild restriction, mild obstruction, severe  diffuse abnormality (pulmonary vascular problem).  CTA chest (4/16) with chronic interstitial and obstructive lung disease, small mediastinal and hilar lymph noes, no PE.  Lymph node biopsy negative for sarcoidosis (reactive changes).  V/Q scan (6/16): No acute or chronic PE. TEE (6/16): Normal LV size and systolic function, EF 97-74%,FS was mildly dilated with mildly decreased systolic function, D-shaped interventricular septum suggestive of RV pressure/volume overload, there appeared to be a small PFO present with some bubbles crossing, no ASD.  ANA negative, anti-RNP negative, HIV negative. Sleep study (8/16) without OSA.  She did not tolerate Adcirca or Revatio.  She cannot tolerate more than 200 mcg bid Selexipag.  - Echo (6/17): EF 23-95%, normal diastolic function, D-shaped interventricular septum, mildly dilated RV with normal RV systolic function, PASP 39, IVC normal.  - Echo (6/18): EF 32-02%, normal diastolic function, D-shaped interventricular septum, mildly dilated RV with normal RV systolic function, PASP 65 mmHg.  - RHC (7/18): RA 13, PA 51/23 mean 36, PCWP 19, CI 4.74, PVR 1.6 WU - RHC (5/19): mean RA 8, PA 48/20 mean 30, mean PCWP 13, CI 5.06, PVR 1.58 WU.  - Echo (6/19): EF 60-65%, mild RV dilation with normal RV systolic function, PASP 60, possible PFO.  2. Chronic venous insufficiency. 3. Hyperlipidemia.  4. GERD 5. Chronic hypoxemic respiratory failure: Hypoxemia, on home oxygen.  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  - PFTs (7/19): FEV1 82%, FVC 80%, ratio 101%, TLC 73%, DLCO 23%.  6. Fe deficiency anemia: EGD/colonoscopy at [facility] normal in 2020.   Current Outpatient Medications  Medication Sig Dispense Refill  . Accu-Chek Softclix Lancets lancets Check glucose BID 100 each 12  . Alcohol Swabs (B-D SINGLE USE SWABS REGULAR) PADS 1 Units by Does not apply route 2 (two) times daily. 100 each 9  . aspirin EC 81 MG tablet Take 1 tablet (81 mg  total) by mouth daily. 90 tablet 3  . Blood Glucose Calibration (ACCU-CHEK AVIVA) SOLN 1 Units by In Vitro route every 30 (thirty) days. 3 each 0  . Blood Glucose Monitoring Suppl (ACCU-CHEK AVIVA PLUS) w/Device KIT USE AS DIRECTED 1 kit 0  . DULoxetine (CYMBALTA) 60 MG capsule Take 2 capsules (120 mg total) by mouth daily. 60 capsule 5  . ferrous sulfate (FERROUSUL) 325 (65 FE) MG tablet Take by mouth.    . fluticasone (FLONASE) 50 MCG/ACT nasal spray Place 1 spray into both nostrils daily. 48 g 3  . glucose blood (ACCU-CHEK AVIVA PLUS) test strip Check glucose BID 100 each 12  . ipratropium-albuterol (DUONEB) 0.5-2.5 (3) MG/3ML SOLN INHALE THE CONTENTS OF 1 VIAL VIA NEBULIZER FOUR TIMES DAILY AS NEEDED 360 mL 4  . macitentan (OPSUMIT) 10 MG tablet Take 1 tablet (10 mg total) by mouth daily. 90 tablet 3  . OXYGEN Take 2.5-4 L by mouth See admin instructions. 2.5 L with resting state & 4 L with exertion [facility]    .  Polyethyl Glycol-Propyl Glycol (LUBRICANT EYE DROPS) 0.4-0.3 % SOLN Place 1 drop into both eyes daily.    . potassium chloride SA (K-DUR,KLOR-CON) 20 MEQ tablet TAKE 1 TABLET TWICE DAILY 180 tablet 3  . pravastatin (PRAVACHOL) 20 MG tablet TAKE 1 TABLET EVERY DAY 90 tablet 3  . Riociguat (ADEMPAS) 2 MG TABS Take 2 mg by mouth 3 (three) times daily. 90 tablet 11  . Selexipag 200 MCG TABS Take 1 tablet (200 mcg total) by mouth 2 (two) times daily. 60 tablet 6  . torsemide (DEMADEX) 20 MG tablet Take 2 tablets (40 mg total) by mouth daily. 180 tablet 3  . umeclidinium-vilanterol (ANORO ELLIPTA) 62.5-25 MCG/INH AEPB INHALE 1 PUFF INTO THE LUNGS DAILY. 180 each 3  . Vitamin D, Ergocalciferol, (DRISDOL) 1.25 MG (50000 UT) CAPS capsule TAKE 1 CAPSULE EVERY MONDAY 12 capsule 3  . zolpidem (AMBIEN) 5 MG tablet Take 1 tablet (5 mg total) by mouth at bedtime. 30 tablet 5   No current facility-administered medications for this encounter.     Allergies:   Gabapentin and Latex   Social  History:  The patient  reports that she quit smoking about 4 years ago. Her smoking use included cigarettes. She has a 4.25 pack-year smoking history. She has never used smokeless tobacco. She reports current drug use. Drug: Cocaine. She reports that she does not drink alcohol.   Family History:  The patient's family history includes Asthma in her sister; Breast cancer in her maternal grandmother; Clotting disorder in her daughter; Diabetes in her mother; Emphysema in her father; Hyperlipidemia in her father and mother; Hypertension in her daughter, father, and mother; Other in her mother; Ovarian cancer in her maternal aunt.   ROS:  Please see the history of present illness.   All other systems are personally reviewed and negative.   Exam:   BP 110/65   Pulse 93   Wt 87.1 kg (192 lb)   SpO2 96%   BMI 30.07 kg/m  General: NAD Neck: No JVD, no thyromegaly or thyroid nodule.  Lungs: Clear to auscultation bilaterally with normal respiratory effort. CV: Nondisplaced PMI.  Heart regular S1/S2, no S3/S4, no murmur.  No peripheral edema.  No carotid bruit.  Normal pedal pulses.  Abdomen: Soft, nontender, no hepatosplenomegaly, no distention.  Skin: Intact without lesions or rashes.  Neurologic: Alert and oriented x 3.  Psych: Normal affect. Extremities: No clubbing or cyanosis.  HEENT: Normal.   Recent Labs: 05/12/2019: ALT 8; TSH 1.590 08/17/2019: Hemoglobin 11.3; Platelets 163 08/20/2019: B Natriuretic Peptide 32.4; BUN 17; Creatinine, Ser 1.08; Potassium 3.8; Sodium 138  Personally reviewed   Wt Readings from Last 3 Encounters:  08/20/19 87.1 kg (192 lb)  08/17/19 86.3 kg (190 lb 3.2 oz)  06/07/19 87.4 kg (192 lb 9.6 oz)      ASSESSMENT AND PLAN:  1. Pulmonary arterial hypertension with RV failure: Severe on 5/16 RHC. 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  There was concern from CTA chest for  sarcoidosis, but lymph node biopsy was negative.  LFTs normal, ANA negative, anti-RNP negative, HIV negative.  V/Q scan negative for chronic PE.  Small PFO but no ASD on TEE. No OSA on sleep study.  She did not tolerate Revatio due to joint pain (now resolved), and she did not tolerate Adcirca with worsening dyspnea. She does not tolerate more than 200 mcg bid Selexipag due to pelvic  pain.  Most recent echo in 6/19 showed normal LV EF, normal RV systolic function with mild RV dilation, and PASP 60.  RHCs in 7/18 and 5/19 have shown well-compensated pulmonary hypertension.  PVR was 1.58 WU on 5/19 study. Echo was done today and reviewed, mildly dilated RV with normal systolic function, estimated PASP only 26 mmHg.  She is improved symptomatically. I think that a lot of her residual dyspnea is due to COPD + ILD rather than pulmonary hypertension.  - She cannot do 6 minute walk today with flip flops.  Will do at next appt. - Open lung biopsy deemed too risky, probably not sarcoid per pulmonary evaluation.    - Continue opsumit and riociguat 2 mg twice a day (unable to go higher due to dizziness).   - She has tolerated selexipag 200 mcg bid but has not been able to increase.  - Check BNP today.  2. Chronic hypoxemic respiratory failure: Chronic interstitial lung disease + emphysema.  Concern for sarcoidosis but biopsy did not show sarcoid.  Deemed too high risk for open lung biopsy.  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 - Follows with Dr Lake Bells - On 2 L O2 at rest, 3 L with activity.  3. Chronic diastolic CHF: Echo 6/41 with EF 55-60%, RV mildly dilated, normal function, PA peak pressure 26 mm Hg. NYHA II-III. She is not volume overloaded on exam. - Continue torsemide 40 mg daily. BMET today.   4. Hyperlipidemia: Check lipids today.   Signed, Loralie Champagne, MD  08/22/2019  [facility] [address] [facility] [place] [address]  [phone] (office) [phone] (fax)

## 2019-09-13 ENCOUNTER — Other Ambulatory Visit: Payer: Self-pay | Admitting: Physician Assistant

## 2019-10-04 ENCOUNTER — Other Ambulatory Visit: Payer: Self-pay | Admitting: Physician Assistant

## 2019-10-04 DIAGNOSIS — I2729 Other secondary pulmonary hypertension: Secondary | ICD-10-CM

## 2019-10-04 DIAGNOSIS — D869 Sarcoidosis, unspecified: Secondary | ICD-10-CM

## 2019-10-18 ENCOUNTER — Other Ambulatory Visit: Payer: Self-pay | Admitting: *Deleted

## 2019-10-18 DIAGNOSIS — D869 Sarcoidosis, unspecified: Secondary | ICD-10-CM

## 2019-10-18 DIAGNOSIS — I2729 Other secondary pulmonary hypertension: Secondary | ICD-10-CM

## 2019-10-18 MED ORDER — IPRATROPIUM-ALBUTEROL 0.5-2.5 (3) MG/3ML IN SOLN: RESPIRATORY_TRACT | 0 refills | Status: DC

## 2019-10-23 ENCOUNTER — Other Ambulatory Visit: Payer: Self-pay | Admitting: Physician Assistant

## 2019-10-23 DIAGNOSIS — M5136 Other intervertebral disc degeneration, lumbar region: Secondary | ICD-10-CM

## 2019-10-23 DIAGNOSIS — M170 Bilateral primary osteoarthritis of knee: Secondary | ICD-10-CM

## 2019-10-27 ENCOUNTER — Other Ambulatory Visit: Payer: Self-pay | Admitting: *Deleted

## 2019-10-27 DIAGNOSIS — D869 Sarcoidosis, unspecified: Secondary | ICD-10-CM

## 2019-10-27 MED ORDER — IPRATROPIUM-ALBUTEROL 0.5-2.5 (3) MG/3ML IN SOLN: RESPIRATORY_TRACT | 0 refills | Status: DC

## 2019-11-09 ENCOUNTER — Other Ambulatory Visit: Payer: Self-pay | Admitting: *Deleted

## 2019-11-09 DIAGNOSIS — I5032 Chronic diastolic (congestive) heart failure: Secondary | ICD-10-CM

## 2019-11-09 MED ORDER — PRAVASTATIN SODIUM 20 MG PO TABS: 20.0000 mg | ORAL_TABLET | Freq: Every day | ORAL | 0 refills | Status: DC

## 2019-11-10 ENCOUNTER — Other Ambulatory Visit: Payer: Self-pay

## 2019-11-11 ENCOUNTER — Ambulatory Visit (INDEPENDENT_AMBULATORY_CARE_PROVIDER_SITE_OTHER): Payer: Medicare PPO | Admitting: Physician Assistant

## 2019-11-11 ENCOUNTER — Encounter: Payer: Self-pay | Admitting: Physician Assistant

## 2019-11-11 DIAGNOSIS — I5032 Chronic diastolic (congestive) heart failure: Secondary | ICD-10-CM

## 2019-11-11 DIAGNOSIS — M170 Bilateral primary osteoarthritis of knee: Secondary | ICD-10-CM

## 2019-11-11 DIAGNOSIS — Z23 Encounter for immunization: Secondary | ICD-10-CM

## 2019-11-11 DIAGNOSIS — M5136 Other intervertebral disc degeneration, lumbar region: Secondary | ICD-10-CM | POA: Diagnosis not present

## 2019-11-11 DIAGNOSIS — I270 Primary pulmonary hypertension: Secondary | ICD-10-CM

## 2019-11-11 DIAGNOSIS — J309 Allergic rhinitis, unspecified: Secondary | ICD-10-CM

## 2019-11-11 DIAGNOSIS — I2729 Other secondary pulmonary hypertension: Secondary | ICD-10-CM | POA: Diagnosis not present

## 2019-11-11 DIAGNOSIS — D869 Sarcoidosis, unspecified: Secondary | ICD-10-CM

## 2019-11-11 DIAGNOSIS — D509 Iron deficiency anemia, unspecified: Secondary | ICD-10-CM

## 2019-11-11 MED ORDER — FERROUS SULFATE 325 (65 FE) MG PO TABS: 325.0000 mg | ORAL_TABLET | Freq: Every day | ORAL | 3 refills | Status: DC

## 2019-11-11 MED ORDER — DULOXETINE HCL 60 MG PO CPEP: ORAL_CAPSULE | ORAL | 3 refills | Status: DC

## 2019-11-11 MED ORDER — ANORO ELLIPTA 62.5-25 MCG/INH IN AEPB: INHALATION_SPRAY | RESPIRATORY_TRACT | 3 refills | Status: DC

## 2019-11-11 MED ORDER — ADEMPAS 2 MG PO TABS: 2.0000 mg | ORAL_TABLET | Freq: Three times a day (TID) | ORAL | 11 refills | Status: DC

## 2019-11-11 MED ORDER — CEFDINIR 300 MG PO CAPS: 300.0000 mg | ORAL_CAPSULE | Freq: Two times a day (BID) | ORAL | 0 refills | Status: DC

## 2019-11-11 MED ORDER — OPSUMIT 10 MG PO TABS: 10.0000 mg | ORAL_TABLET | Freq: Every day | ORAL | 3 refills | Status: DC

## 2019-11-11 MED ORDER — POTASSIUM CHLORIDE CRYS ER 20 MEQ PO TBCR: EXTENDED_RELEASE_TABLET | ORAL | 3 refills | Status: DC

## 2019-11-11 MED ORDER — ZOLPIDEM TARTRATE 5 MG PO TABS: 5.0000 mg | ORAL_TABLET | Freq: Every day | ORAL | 5 refills | Status: DC

## 2019-11-11 MED ORDER — PRAVASTATIN SODIUM 20 MG PO TABS: 20.0000 mg | ORAL_TABLET | Freq: Every day | ORAL | 3 refills | Status: DC

## 2019-11-11 MED ORDER — SELEXIPAG 200 MCG PO TABS: 200.0000 ug | ORAL_TABLET | Freq: Two times a day (BID) | ORAL | 6 refills | Status: DC

## 2019-11-11 MED ORDER — TORSEMIDE 20 MG PO TABS: 40.0000 mg | ORAL_TABLET | Freq: Every day | ORAL | 3 refills | Status: DC

## 2019-11-11 MED ORDER — HYDROCODONE-HOMATROPINE 5-1.5 MG/5ML PO SYRP: 5.0000 mL | ORAL_SOLUTION | Freq: Four times a day (QID) | ORAL | 0 refills | Status: DC | PRN

## 2019-11-11 MED ORDER — IPRATROPIUM-ALBUTEROL 0.5-2.5 (3) MG/3ML IN SOLN: RESPIRATORY_TRACT | 3 refills | Status: DC

## 2019-11-11 MED ORDER — FLUTICASONE PROPIONATE 50 MCG/ACT NA SUSP: 1.0000 | Freq: Every day | NASAL | 3 refills | Status: DC

## 2019-11-12 LAB — CMP14+EGFR
ALT: 12 IU/L (ref 0–32)
AST: 20 IU/L (ref 0–40)
Albumin/Globulin Ratio: 1.6 (ref 1.2–2.2)
Albumin: 4.1 g/dL (ref 3.8–4.8)
Alkaline Phosphatase: 61 IU/L (ref 39–117)
BUN/Creatinine Ratio: 16 (ref 12–28)
BUN: 16 mg/dL (ref 8–27)
Bilirubin Total: 0.4 mg/dL (ref 0.0–1.2)
CO2: 29 mmol/L (ref 20–29)
Calcium: 8.9 mg/dL (ref 8.7–10.3)
Chloride: 97 mmol/L (ref 96–106)
Creatinine, Ser: 1 mg/dL (ref 0.57–1.00)
GFR calc Af Amer: 69 mL/min/{1.73_m2} (ref >59)
GFR calc non Af Amer: 60 mL/min/{1.73_m2} (ref >59)
Globulin, Total: 2.6 g/dL (ref 1.5–4.5)
Glucose: 79 mg/dL (ref 65–99)
Potassium: 4.3 mmol/L (ref 3.5–5.2)
Sodium: 140 mmol/L (ref 134–144)
Total Protein: 6.7 g/dL (ref 6.0–8.5)

## 2019-11-12 LAB — CBC WITH DIFFERENTIAL/PLATELET
Basophils Absolute: 0.1 10*3/uL (ref 0.0–0.2)
Basos: 1 %
EOS (ABSOLUTE): 0.7 10*3/uL — ABNORMAL HIGH (ref 0.0–0.4)
Eos: 7 %
Hematocrit: 37.7 % (ref 34.0–46.6)
Hemoglobin: 12.2 g/dL (ref 11.1–15.9)
Immature Grans (Abs): 0 10*3/uL (ref 0.0–0.1)
Immature Granulocytes: 0 %
Lymphocytes Absolute: 2 10*3/uL (ref 0.7–3.1)
Lymphs: 22 %
MCH: 27.5 pg (ref 26.6–33.0)
MCHC: 32.4 g/dL (ref 31.5–35.7)
MCV: 85 fL (ref 79–97)
Monocytes Absolute: 0.6 10*3/uL (ref 0.1–0.9)
Monocytes: 7 %
Neutrophils Absolute: 5.6 10*3/uL (ref 1.4–7.0)
Neutrophils: 63 %
Platelets: 168 10*3/uL (ref 150–450)
RBC: 4.43 x10E6/uL (ref 3.77–5.28)
RDW: 14.6 % (ref 11.7–15.4)
WBC: 9 10*3/uL (ref 3.4–10.8)

## 2019-11-12 LAB — LIPID PANEL
Chol/HDL Ratio: 2.1 ratio (ref 0.0–4.4)
Cholesterol, Total: 213 mg/dL — ABNORMAL HIGH (ref 100–199)
HDL: 102 mg/dL (ref >39)
LDL Chol Calc (NIH): 99 mg/dL (ref 0–99)
Triglycerides: 67 mg/dL (ref 0–149)
VLDL Cholesterol Cal: 12 mg/dL (ref 5–40)

## 2019-11-12 LAB — TSH: TSH: 0.867 u[IU]/mL (ref 0.450–4.500)

## 2019-11-12 NOTE — Progress Notes (Signed)
BP (!) 92/58   Pulse 65   Temp 97.7 F (36.5 C) (Temporal)   Ht 5' 7" (1.702 m)   Wt 197 lb 3.2 oz (89.4 kg)   SpO2 97%   BMI 30.89 kg/m    Subjective:    Patient ID: Kelly Donaldson, female    DOB: [birth year], 63 y.o.   MRN: [id number]  HPI: Kelly Donaldson is a 63 y.o. female presenting on 11/11/2019 for Medical Management of Chronic Issues (3 MTH )  This patient comes in for 63-monthrecheck on her chronic medical conditions they do include chronic congestive heart failure, pulmonary hypertension, allergic rhinitis, degenerative disc disease, osteoarthritis of knees.  Still under the care of pulmonology and cardiology.  The chart is reviewed.  She will be having regular follow-ups with them.  She does have an upcoming appointment with rheumatology for the evaluation of possible rheumatoid arthritis with her joints.  She is still under the care of chronic pain management and is continuing with them.  That has done fairly well.  She states that overall she is fairly good and stable with most of her conditions.  She states her depression has been fairly stable.  She denies any other new issues at this time. [facility] 11/11/2019 08/17/2019 06/07/2019 02/12/2019 11/13/2018  Decreased Interest 0 0 0 1 0  Down, Depressed, Hopeless 0 0 0 1 2  PHQ - 2 Score 0 0 0 2 2  Altered sleeping - - - 1 1  Tired, decreased energy - - - 1 1  Change in appetite - - - 1 1  Feeling bad or failure about yourself  - - - 0 1  Trouble concentrating - - - 0 1  Moving slowly or fidgety/restless - - - 0 1  Suicidal thoughts - - - 0 2  PHQ-9 Score - - - 5 10  Difficult doing work/chores - - - - -  Some recent data might be hidden     Past Medical History:  Diagnosis Date  . Allergy   . Depression   . GERD (gastroesophageal reflux disease)   . Hyperlipidemia   . Oxygen dependent   . Shortness of breath dyspnea   . Varicose veins    Relevant past medical, surgical, family and social history  reviewed and updated as indicated. Interim medical history since our last visit reviewed. Allergies and medications reviewed and updated. DATA REVIEWED: CHART IN EPIC  Family History reviewed for pertinent findings.  Review of Systems  Constitutional: Negative.  Negative for activity change, fatigue and fever.  HENT: Positive for congestion and sinus pain.   Eyes: Negative.   Respiratory: Positive for shortness of breath. Negative for cough and wheezing.   Cardiovascular: Negative.  Negative for chest pain, palpitations and leg swelling.  Gastrointestinal: Negative.  Negative for abdominal pain.  Endocrine: Negative.   Genitourinary: Negative.  Negative for dysuria.  Musculoskeletal: Negative.   Skin: Negative.   Neurological: Negative.     Allergies as of 11/11/2019      Reactions   Gabapentin Other (See Comments)   Pt states that she can not take with antidepressants   Latex Rash      Medication List       Accurate as of November 11, 2019 11:59 PM. If you have any questions, ask your nurse or doctor.        Accu-Chek Aviva Plus w/Device Kit USE AS DIRECTED   Accu-Chek Aviva Soln 1 Units  by In Vitro route every 30 (thirty) days.   Accu-Chek Softclix Lancets lancets Check glucose BID   Adempas 2 MG Tabs Generic drug: Riociguat Take 2 mg by mouth 3 (three) times daily.   Anoro Ellipta 62.5-25 MCG/INH Aepb Generic drug: umeclidinium-vilanterol INHALE 1 PUFF INTO THE LUNGS DAILY.   aspirin EC 81 MG tablet Take 1 tablet (81 mg total) by mouth daily.   B-D SINGLE USE SWABS REGULAR Pads 1 Units by Does not apply route 2 (two) times daily.   cefdinir 300 MG capsule Commonly known as: OMNICEF Take 1 capsule (300 mg total) by mouth 2 (two) times daily. 1 po BID Started by: Terald Sleeper, PA-C   DULoxetine 60 MG capsule Commonly known as: CYMBALTA TAKE 2 CAPSULES(120 MG) BY MOUTH DAILY   ferrous sulfate 325 (65 FE) MG tablet Commonly known as: FerrouSul Take 1  tablet (325 mg total) by mouth daily with breakfast. What changed:   how much to take  when to take this Changed by: Terald Sleeper, PA-C   fluticasone 50 MCG/ACT nasal spray Commonly known as: FLONASE Place 1 spray into both nostrils daily.   glucose blood test strip Commonly known as: Accu-Chek Aviva Plus Check glucose BID   HYDROcodone-homatropine 5-1.5 MG/5ML syrup Commonly known as: HYCODAN Take 5 mLs by mouth every 6 (six) hours as needed for cough. Started by: Terald Sleeper, PA-C   ipratropium-albuterol 0.5-2.5 (3) MG/3ML Soln Commonly known as: DUONEB INHALE THE CONTENTS OF 1 VIAL VIA NEBULIZER FOUR TIMES DAILY AS NEEDED   Lubricant Eye Drops 0.4-0.3 % Soln Generic drug: Polyethyl Glycol-Propyl Glycol Place 1 drop into both eyes daily.   Opsumit 10 MG tablet Generic drug: macitentan Take 1 tablet (10 mg total) by mouth daily.   OXYGEN Take 2.5-4 L by mouth See admin instructions. 2.5 L with resting state & 4 L with exertion [facility]   potassium chloride SA 20 MEQ tablet Commonly known as: KLOR-CON TAKE 1 TABLET TWICE DAILY   pravastatin 20 MG tablet Commonly known as: PRAVACHOL Take 1 tablet (20 mg total) by mouth daily.   Selexipag 200 MCG Tabs Take 1 tablet (200 mcg total) by mouth 2 (two) times daily.   torsemide 20 MG tablet Commonly known as: DEMADEX Take 2 tablets (40 mg total) by mouth daily.   Vitamin D (Ergocalciferol) 1.25 MG (50000 UT) Caps capsule Commonly known as: DRISDOL TAKE 1 CAPSULE EVERY MONDAY   zolpidem 5 MG tablet Commonly known as: AMBIEN Take 1 tablet (5 mg total) by mouth at bedtime.          Objective:    BP (!) 92/58   Pulse 65   Temp 97.7 F (36.5 C) (Temporal)   Ht 5' 7" (1.702 m)   Wt 197 lb 3.2 oz (89.4 kg)   SpO2 97%   BMI 30.89 kg/m   Allergies  Allergen Reactions  . Gabapentin Other (See Comments)    Pt states that she can not take with antidepressants  . Latex Rash    Wt Readings from Last 3  Encounters:  11/11/19 197 lb 3.2 oz (89.4 kg)  08/20/19 192 lb (87.1 kg)  08/17/19 190 lb 3.2 oz (86.3 kg)    Physical Exam Constitutional:      Appearance: She is well-developed.  HENT:     Head: Normocephalic and atraumatic.     Right Ear: Tympanic membrane, ear canal and external ear normal.     Left Ear: Tympanic membrane, ear canal  and external ear normal.     Nose: Congestion present. No rhinorrhea.     Mouth/Throat:     Pharynx: No oropharyngeal exudate or posterior oropharyngeal erythema.  Eyes:     Conjunctiva/sclera: Conjunctivae normal.     Pupils: Pupils are equal, round, and reactive to light.  Neck:     Musculoskeletal: Normal range of motion and neck supple.  Cardiovascular:     Rate and Rhythm: Normal rate and regular rhythm.     Heart sounds: Normal heart sounds.  Pulmonary:     Effort: Pulmonary effort is normal.     Breath sounds: Normal breath sounds.  Abdominal:     General: Bowel sounds are normal.     Palpations: Abdomen is soft.  Skin:    General: Skin is warm and dry.     Findings: No rash.  Neurological:     Mental Status: She is alert and oriented to person, place, and time.     Deep Tendon Reflexes: Reflexes are normal and symmetric.  Psychiatric:        Behavior: Behavior normal.        Thought Content: Thought content normal.        Judgment: Judgment normal.     Results for orders placed or performed in visit on 11/11/19  CBC with Differential/Platelet  Result Value Ref Range   WBC 9.0 3.4 - 10.8 x10E3/uL   RBC 4.43 3.77 - 5.28 x10E6/uL   Hemoglobin 12.2 11.1 - 15.9 g/dL   Hematocrit 37.7 34.0 - 46.6 %   MCV 85 79 - 97 fL   MCH 27.5 26.6 - 33.0 pg   MCHC 32.4 31.5 - 35.7 g/dL   RDW 14.6 11.7 - 15.4 %   Platelets 168 150 - 450 x10E3/uL   Neutrophils 63 Not Estab. %   Lymphs 22 Not Estab. %   Monocytes 7 Not Estab. %   Eos 7 Not Estab. %   Basos 1 Not Estab. %   Neutrophils Absolute 5.6 1.4 - 7.0 x10E3/uL   Lymphocytes Absolute  2.0 0.7 - 3.1 x10E3/uL   Monocytes Absolute 0.6 0.1 - 0.9 x10E3/uL   EOS (ABSOLUTE) 0.7 (H) 0.0 - 0.4 x10E3/uL   Basophils Absolute 0.1 0.0 - 0.2 x10E3/uL   Immature Granulocytes 0 Not Estab. %   Immature Grans (Abs) 0.0 0.0 - 0.1 x10E3/uL  CMP14+EGFR  Result Value Ref Range   Glucose 79 65 - 99 mg/dL   BUN 16 8 - 27 mg/dL   Creatinine, Ser 1.00 0.57 - 1.00 mg/dL   GFR calc non Af Amer 60 >59 mL/min/1.73   GFR calc Af Amer 69 >59 mL/min/1.73   BUN/Creatinine Ratio 16 12 - 28   Sodium 140 134 - 144 mmol/L   Potassium 4.3 3.5 - 5.2 mmol/L   Chloride 97 96 - 106 mmol/L   CO2 29 20 - 29 mmol/L   Calcium 8.9 8.7 - 10.3 mg/dL   Total Protein 6.7 6.0 - 8.5 g/dL   Albumin 4.1 3.8 - 4.8 g/dL   Globulin, Total 2.6 1.5 - 4.5 g/dL   Albumin/Globulin Ratio 1.6 1.2 - 2.2   Bilirubin Total 0.4 0.0 - 1.2 mg/dL   Alkaline Phosphatase 61 39 - 117 IU/L   AST 20 0 - 40 IU/L   ALT 12 0 - 32 IU/L  Lipid Panel  Result Value Ref Range   Cholesterol, Total 213 (H) 100 - 199 mg/dL   Triglycerides 67 0 - 149 mg/dL  HDL 102 >39 mg/dL   VLDL Cholesterol Cal 12 5 - 40 mg/dL   LDL Chol Calc (NIH) 99 0 - 99 mg/dL   Chol/HDL Ratio 2.1 0.0 - 4.4 ratio  TSH  Result Value Ref Range   TSH 0.867 0.450 - 4.500 uIU/mL      Assessment & Plan:   1. Pulmonary hypertension associated with sarcoidosis (HCC) - torsemide (DEMADEX) 20 MG tablet; Take 2 tablets (40 mg total) by mouth daily.  Dispense: 180 tablet; Refill: 3 - ipratropium-albuterol (DUONEB) 0.5-2.5 (3) MG/3ML SOLN; INHALE THE CONTENTS OF 1 VIAL VIA NEBULIZER FOUR TIMES DAILY AS NEEDED  Dispense: 1080 mL; Refill: 3 - CBC with Differential/Platelet - CMP14+EGFR - Lipid Panel - TSH  2. Primary pulmonary hypertension (HCC) - Selexipag 200 MCG TABS; Take 1 tablet (200 mcg total) by mouth 2 (two) times daily.  Dispense: 60 tablet; Refill: 6 - Riociguat (ADEMPAS) 2 MG TABS; Take 2 mg by mouth 3 (three) times daily.  Dispense: 90 tablet; Refill: 11 -  potassium chloride SA (KLOR-CON) 20 MEQ tablet; TAKE 1 TABLET TWICE DAILY  Dispense: 180 tablet; Refill: 3 - macitentan (OPSUMIT) 10 MG tablet; Take 1 tablet (10 mg total) by mouth daily.  Dispense: 90 tablet; Refill: 3  3. Primary osteoarthritis of both knees - DULoxetine (CYMBALTA) 60 MG capsule; TAKE 2 CAPSULES(120 MG) BY MOUTH DAILY  Dispense: 120 capsule; Refill: 3  4. DDD (degenerative disc disease), lumbar - DULoxetine (CYMBALTA) 60 MG capsule; TAKE 2 CAPSULES(120 MG) BY MOUTH DAILY  Dispense: 120 capsule; Refill: 3  5. Chronic diastolic CHF (congestive heart failure) (HCC) - pravastatin (PRAVACHOL) 20 MG tablet; Take 1 tablet (20 mg total) by mouth daily.  Dispense: 90 tablet; Refill: 3  6. Iron deficiency anemia, unspecified iron deficiency anemia type - ferrous sulfate (FERROUSUL) 325 (65 FE) MG tablet; Take 1 tablet (325 mg total) by mouth daily with breakfast.  Dispense: 90 tablet; Refill: 3 - CBC with Differential/Platelet - CMP14+EGFR - Lipid Panel - TSH  7. Allergic rhinitis, unspecified seasonality, unspecified trigger - fluticasone (FLONASE) 50 MCG/ACT nasal spray; Place 1 spray into both nostrils daily.  Dispense: 48 g; Refill: 3   Continue all other maintenance medications as listed above.  Follow up plan: Return in about 3 months (around 02/09/2020).  Educational handout given for [place] PA-C [place] [address]  [place], [place] [address] [phone]   11/12/2019, 3:15 PM

## 2019-11-16 ENCOUNTER — Telehealth: Payer: Self-pay | Admitting: Physician Assistant

## 2019-11-16 ENCOUNTER — Telehealth: Payer: Self-pay | Admitting: *Deleted

## 2019-11-16 NOTE — Telephone Encounter (Signed)
[place] from [facility] called regarding the rx for Opsumit. To be able to prescribe this you have to be enrolled with the manufacture [employer]). All prescribers must be enrolled with them to prescribe this medication. The number to call and enroll is [phone].

## 2019-11-16 NOTE — Telephone Encounter (Signed)
She has pulmonology and cardiology to do this.  I will let her get in touch with them for the refill.  Or the pharmacy can reach out to them.

## 2019-11-16 NOTE — Telephone Encounter (Signed)
Pt aware and will reach out to her pulmonologist for this medication.

## 2019-11-17 NOTE — Telephone Encounter (Signed)
Med list sent

## 2019-11-19 ENCOUNTER — Ambulatory Visit (HOSPITAL_COMMUNITY)
Admission: RE | Admit: 2019-11-19 | Discharge: 2019-11-19 | Disposition: A | Discharge status: Home / Self Care | Payer: Medicare PPO | Source: Ambulatory Visit | Attending: Cardiology | Admitting: Cardiology

## 2019-11-19 ENCOUNTER — Ambulatory Visit: Payer: Medicare PPO | Admitting: Physician Assistant

## 2019-11-19 ENCOUNTER — Other Ambulatory Visit: Payer: Self-pay

## 2019-11-19 ENCOUNTER — Encounter (HOSPITAL_COMMUNITY): Payer: Self-pay | Admitting: Cardiology

## 2019-11-19 VITALS — BP 110/54 | HR 73 | Wt 199.0 lb

## 2019-11-19 DIAGNOSIS — Z9981 Dependence on supplemental oxygen: Secondary | ICD-10-CM | POA: Insufficient documentation

## 2019-11-19 DIAGNOSIS — D869 Sarcoidosis, unspecified: Secondary | ICD-10-CM

## 2019-11-19 DIAGNOSIS — Z888 Allergy status to other drugs, medicaments and biological substances status: Secondary | ICD-10-CM | POA: Insufficient documentation

## 2019-11-19 DIAGNOSIS — Z87891 Personal history of nicotine dependence: Secondary | ICD-10-CM | POA: Insufficient documentation

## 2019-11-19 DIAGNOSIS — Z7982 Long term (current) use of aspirin: Secondary | ICD-10-CM | POA: Diagnosis not present

## 2019-11-19 DIAGNOSIS — J9611 Chronic respiratory failure with hypoxia: Secondary | ICD-10-CM | POA: Insufficient documentation

## 2019-11-19 DIAGNOSIS — Z79899 Other long term (current) drug therapy: Secondary | ICD-10-CM | POA: Insufficient documentation

## 2019-11-19 DIAGNOSIS — J449 Chronic obstructive pulmonary disease, unspecified: Secondary | ICD-10-CM | POA: Insufficient documentation

## 2019-11-19 DIAGNOSIS — E785 Hyperlipidemia, unspecified: Secondary | ICD-10-CM | POA: Diagnosis not present

## 2019-11-19 DIAGNOSIS — I5032 Chronic diastolic (congestive) heart failure: Secondary | ICD-10-CM | POA: Insufficient documentation

## 2019-11-19 DIAGNOSIS — J849 Interstitial pulmonary disease, unspecified: Secondary | ICD-10-CM | POA: Insufficient documentation

## 2019-11-19 DIAGNOSIS — Z8249 Family history of ischemic heart disease and other diseases of the circulatory system: Secondary | ICD-10-CM | POA: Insufficient documentation

## 2019-11-19 DIAGNOSIS — I872 Venous insufficiency (chronic) (peripheral): Secondary | ICD-10-CM | POA: Diagnosis not present

## 2019-11-19 DIAGNOSIS — I2729 Other secondary pulmonary hypertension: Secondary | ICD-10-CM | POA: Diagnosis not present

## 2019-11-19 DIAGNOSIS — I2721 Secondary pulmonary arterial hypertension: Secondary | ICD-10-CM | POA: Diagnosis not present

## 2019-11-19 NOTE — Patient Instructions (Addendum)
No medication changes today.   Please go up to 4L oxygen with exertion.  Your oxygen decreased significantly with your walk today.   You have been referred to Dr Simonne Maffucci with Pulmonary.  His office will call you to schedule an appointment.  Your physician recommends that you schedule a follow-up appointment in: 3 months with Dr [name].   At the [facility], you and your health needs are our priority. As part of our continuing mission to provide you with exceptional heart care, we have created designated Provider Care Teams. These Care Teams include your primary Cardiologist (physician) and Advanced Practice Providers (APPs- Physician Assistants and Nurse Practitioners) who all work together to provide you with the care you need, when you need it.   You may see any of the following providers on your designated Care Team at your next follow up: Marland Kitchen Dr Glori Bickers . Dr Loralie Champagne . Darrick Grinder, NP . Lyda Jester, PA . Audry Riles, PharmD   Please be sure to bring in all your medications bottles to every appointment.

## 2019-11-21 NOTE — Progress Notes (Signed)
Date:  11/21/2019   ID:  Kelly Donaldson, DOB 12-06-1956, MRN [id number]  Provider location: [facility] Type of Visit: Established patient   PCP:  Terald Sleeper, PA-C  Cardiologist:  Dr. [name]  Chief Complaint: Shortness of breath.   History of Present Illness: Kelly Donaldson is a 63 y.o. female who has a history of chronic hypoxemic respiratory failure and pulmonary hypertension.  She has been followed by Dr Melvyn Novas, now follows with [name] with pulmonology.  Has been on home oxygen for several years now.  She quit smoking in 2016.  She had an echo done in 5/16 showing dilated RV with severe pulmonary hypertension.  She therefore had RHC in 5/16 which confirmed severe pulmonary hypertension and RV failure.  PFTs showed only mild obstruction and restriction with markedly decreased DLCO suggestive of pulmonary vascular disease.  There was concern for sarcoidosis on CT chest but lymph node biopsy showed no evidence for sarcoidosis.  V/Q scan showed no evidence for chronic PE.   She has not tolerated Revatio or Adcirca.  Have failed previous up-titration of Selexipag with pelvic pain, can tolerate 200 mcg bid.  She is now tolerating riociguat 2 mg tid.   Started torsemide in Oct. 2017, has had better diuresis.   Due to ongoing dyspnea, she had repeat RHC in 5/19 that showed normal filling pressures and well-controlled pulmonary hypertension.  Echo in 6/19 showed normal LV EF, normal RV systolic function, and mild RV dilation.   She now is on Anoro, feels like this has helped her breathing.    Echo in 9/20 showed EF 55-60%, mild RV dilation with normal systolic function, PASP 26 mmHg, IVC normal.   She returns for followup of pulmonary HTN.  She is doing well symptomatically, but weight is up 7 lbs.  She is short of breath walking long distances, ok around the house and in stores.  No problems walking to mailbox.  No orthopnea/PND.  No chest pain.  No  lightheadedness.  Oxygen saturation dropped significantly on 2L [place] with ambulation, needed to increase to 4L.    6 minute walk (5/16): 238 meters => decreased oxygen saturation to 73%, increased to 6 L [place] 6 minute walk (7/16): 170 meters => unable to complete.  6 minute walk (9/16): 201 meters 6 minute walk (1/17): 256 meters 6 minute walk (4/17): 219 meters 6 minute walk (8/17): 225 meters 6 minute walk (11/17): 244 meters 6 minute walk (5/18): 229 meters 6 minute walk (2/19): 122 meters (but she was stopped halfway through with drop in oxygen saturation).  6 minute walk (12/20): 304 meters  Labs (2/16): BNP 69 Labs (5/16): LFTs normal, HCT 34.9, RF negative, HIV negative, TSH negative Labs (6/16): K 4.4, creatinine 0.88 Labs (7/16): ANA negative, BNP 58, K 4.3, creatinine 1.1, anti-RNP negative Labs (9/16): K 4.4, creatinine 1.26, BNP 91 Labs (10/16): K 4.3, creatinine 1.04 Labs (12/16): K 4.2, creatinine 1.07, BNP 130 Labs (1/17): K 4.3, creatinine 1.23, BNP 42 Labs (3/17): K 4.2, creatinine 1.19 Labs (5/17): K 4.3, creatinine 1.35, BNP 44 Labs (6/17): K 4.4, creatinine 1.18, BNP 44 Labs (10/17): K 4.2, creatinine 0.98, BNP 43 Labs (12/17): K 4.5, creatinine 1.23. BNP 64.5 Labs (2/18): K 4.5, creatinine 1.21, BNP 64 Labs (4/18): K 4.1, creatinine 1.24, BNP 26 Labs (9/18): K 4.6, creatinine 1.25 Labs (2/19): K 4.2, creatinine 1.39 Labs (5/19): K 4, creatinine 1.4, hgb 8.4 Labs (12/19): K 4.3, creatinine  1.15 Labs (6/20): TSH normal, K 4, creatinine 1.04, LDL 78 . PMH: 1. Pulmonary hypertension: RHC (5/16) with mean RA 18, PA 83/33 mean 53, mean PCWP 19, CI 2.61, PVR 5.8 WU.   Echo (5/16) with EF 65-70%, septal flattening, dilated RV with PA systolic pressure 99 mmHg, +bubble study.  PFTs (4/16) with FVC 76%, FEV1 75%, ratio 97%, TLC 66%, DLCO 26% => mild restriction, mild obstruction, severe diffuse abnormality (pulmonary vascular problem).  CTA chest (4/16) with chronic  interstitial and obstructive lung disease, small mediastinal and hilar lymph noes, no PE.  Lymph node biopsy negative for sarcoidosis (reactive changes).  V/Q scan (6/16): No acute or chronic PE. TEE (6/16): Normal LV size and systolic function, EF 59-93%,TT was mildly dilated with mildly decreased systolic function, D-shaped interventricular septum suggestive of RV pressure/volume overload, there appeared to be a small PFO present with some bubbles crossing, no ASD.  ANA negative, anti-RNP negative, HIV negative. Sleep study (8/16) without OSA.  She did not tolerate Adcirca or Revatio.  She cannot tolerate more than 200 mcg bid Selexipag.  - Echo (6/17): EF 01-77%, normal diastolic function, D-shaped interventricular septum, mildly dilated RV with normal RV systolic function, PASP 39, IVC normal.  - Echo (6/18): EF 93-90%, normal diastolic function, D-shaped interventricular septum, mildly dilated RV with normal RV systolic function, PASP 65 mmHg.  - RHC (7/18): RA 13, PA 51/23 mean 36, PCWP 19, CI 4.74, PVR 1.6 WU - RHC (5/19): mean RA 8, PA 48/20 mean 30, mean PCWP 13, CI 5.06, PVR 1.58 WU.  - Echo (6/19): EF 60-65%, mild RV dilation with normal RV systolic function, PASP 60, possible PFO.  - Echo (9/20): EF 55-60%, mild RV dilation with normal systolic function, PASP 26 mmHg, IVC normal.  2. Chronic venous insufficiency. 3. Hyperlipidemia.  4. GERD 5. Chronic hypoxemic respiratory failure: Hypoxemia, on home oxygen.  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  - PFTs (7/19): FEV1 82%, FVC 80%, ratio 101%, TLC 73%, DLCO 23%.  6. Fe deficiency anemia: [facility] in 2020.   Current Outpatient Medications  Medication Sig Dispense Refill  . Accu-Chek Softclix Lancets lancets Check glucose BID 100 each 12  . Alcohol Swabs (B-D SINGLE USE SWABS REGULAR) PADS 1 Units by Does not apply route 2 (two) times daily. 100 each 9  . aspirin EC 81 MG tablet  Take 1 tablet (81 mg total) by mouth daily. 90 tablet 3  . Blood Glucose Calibration (ACCU-CHEK AVIVA) SOLN 1 Units by In Vitro route every 30 (thirty) days. 3 each 0  . Blood Glucose Monitoring Suppl (ACCU-CHEK AVIVA PLUS) w/Device KIT USE AS DIRECTED 1 kit 0  . cefdinir (OMNICEF) 300 MG capsule Take 1 capsule (300 mg total) by mouth 2 (two) times daily. 1 po BID 20 capsule 0  . DULoxetine (CYMBALTA) 60 MG capsule TAKE 2 CAPSULES(120 MG) BY MOUTH DAILY 120 capsule 3  . ferrous sulfate (FERROUSUL) 325 (65 FE) MG tablet Take 1 tablet (325 mg total) by mouth daily with breakfast. 90 tablet 3  . fluticasone (FLONASE) 50 MCG/ACT nasal spray Place 1 spray into both nostrils daily. 48 g 3  . glucose blood (ACCU-CHEK AVIVA PLUS) test strip Check glucose BID 100 each 12  . HYDROcodone-homatropine (HYCODAN) 5-1.5 MG/5ML syrup Take 5 mLs by mouth every 6 (six) hours as needed for cough. 120 mL 0  . ipratropium-albuterol (DUONEB) 0.5-2.5 (3) MG/3ML SOLN INHALE THE CONTENTS OF 1 VIAL  VIA NEBULIZER FOUR TIMES DAILY AS NEEDED 1080 mL 3  . macitentan (OPSUMIT) 10 MG tablet Take 1 tablet (10 mg total) by mouth daily. 90 tablet 3  . OXYGEN Take 2.5-4 L by mouth See admin instructions. 2.5 L with resting state & 4 L with exertion [facility]    . Polyethyl Glycol-Propyl Glycol (LUBRICANT EYE DROPS) 0.4-0.3 % SOLN Place 1 drop into both eyes daily.    . potassium chloride SA (KLOR-CON) 20 MEQ tablet TAKE 1 TABLET TWICE DAILY 180 tablet 3  . pravastatin (PRAVACHOL) 20 MG tablet Take 1 tablet (20 mg total) by mouth daily. 90 tablet 3  . Riociguat (ADEMPAS) 2 MG TABS Take 2 mg by mouth 3 (three) times daily. 90 tablet 11  . Selexipag 200 MCG TABS Take 1 tablet (200 mcg total) by mouth 2 (two) times daily. 60 tablet 6  . torsemide (DEMADEX) 20 MG tablet Take 40 mg by mouth 2 (two) times daily.    Marland Kitchen umeclidinium-vilanterol (ANORO ELLIPTA) 62.5-25 MCG/INH AEPB INHALE 1 PUFF INTO THE LUNGS DAILY. 180 each 3  . Vitamin D,  Ergocalciferol, (DRISDOL) 1.25 MG (50000 UT) CAPS capsule TAKE 1 CAPSULE EVERY MONDAY 12 capsule 3  . zolpidem (AMBIEN) 5 MG tablet Take 1 tablet (5 mg total) by mouth at bedtime. 30 tablet 5   No current facility-administered medications for this encounter.    Allergies:   Gabapentin and Latex   Social History:  The patient  reports that she quit smoking about 4 years ago. Her smoking use included cigarettes. She has a 4.25 pack-year smoking history. She has never used smokeless tobacco. She reports current drug use. Drug: Cocaine. She reports that she does not drink alcohol.   Family History:  The patient's family history includes Asthma in her sister; Breast cancer in her maternal grandmother; Clotting disorder in her daughter; Diabetes in her mother; Emphysema in her father; Hyperlipidemia in her father and mother; Hypertension in her daughter, father, and mother; Other in her mother; Ovarian cancer in her maternal aunt.   ROS:  Please see the history of present illness.   All other systems are personally reviewed and negative.   Exam:   BP (!) 110/54   Pulse 73   Wt 90.3 kg (199 lb)   SpO2 94%   BMI 31.17 kg/m  General: NAD Neck: No JVD, no thyromegaly or thyroid nodule.  Lungs: Clear to auscultation bilaterally with normal respiratory effort. CV: Nondisplaced PMI.  Heart regular S1/S2, no S3/S4, no murmur.  No peripheral edema.  No carotid bruit.  Normal pedal pulses.  Abdomen: Soft, nontender, no hepatosplenomegaly, no distention.  Skin: Intact without lesions or rashes.  Neurologic: Alert and oriented x 3.  Psych: Normal affect. Extremities: No clubbing or cyanosis.  HEENT: Normal.   Recent Labs: 08/20/2019: B Natriuretic Peptide 32.4 11/11/2019: ALT 12; BUN 16; Creatinine, Ser 1.00; Hemoglobin 12.2; Platelets 168; Potassium 4.3; Sodium 140; TSH 0.867  Personally reviewed   Wt Readings from Last 3 Encounters:  11/19/19 90.3 kg (199 lb)  11/11/19 89.4 kg (197 lb 3.2 oz)    08/20/19 87.1 kg (192 lb)      ASSESSMENT AND PLAN:  1. Pulmonary arterial hypertension with RV failure: Severe on 5/16 RHC. 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  There was concern from CTA chest for sarcoidosis, but lymph node biopsy was negative.  LFTs normal, ANA negative, anti-RNP negative,  HIV negative.  V/Q scan negative for chronic PE.  Small PFO but no ASD on TEE. No OSA on sleep study.  She did not tolerate Revatio due to joint pain (now resolved), and she did not tolerate Adcirca with worsening dyspnea. She does not tolerate more than 200 mcg bid Selexipag due to pelvic pain.  Most recent echo in 6/19 showed normal LV EF, normal RV systolic function with mild RV dilation, and PASP 60.  RHCs in 7/18 and 5/19 have shown well-compensated pulmonary hypertension.  PVR was 1.58 WU on 5/19 study. Echo in 9/20 showed mildly dilated RV with normal systolic function, estimated PASP only 26 mmHg.  She is improved symptomatically. I think that a lot of her residual dyspnea is due to COPD + ILD rather than pulmonary hypertension.  6 minute walk was improved today though oxygen saturation dropped with ambulation.  - She needs to increase oxygen to 4L [place] when active.  - Open lung biopsy deemed too risky, probably not sarcoid per pulmonary evaluation.    - Continue opsumit and riociguat 2 mg twice a day (unable to go higher due to dizziness).   - She has tolerated selexipag 200 mcg bid but has not been able to increase.  2. Chronic hypoxemic respiratory failure: Chronic interstitial lung disease + emphysema.  Concern for sarcoidosis but biopsy did not show sarcoid.  Deemed too high risk for open lung biopsy.  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 - Needs pulmonary followup, will arrange.  3. Chronic diastolic CHF: Echo 1/14 with EF 55-60%, RV mildly dilated, normal function, PA peak pressure 26 mm Hg.  NYHA II-III. She is not volume overloaded on exam. - Continue torsemide 40 mg bid. Recent BMET ok.   4. Hyperlipidemia: Lipids ok in 9/20.   followup in 3 months  Signed, Loralie Champagne, MD  11/21/2019  [facility] [address] [facility] [place] [address] [phone] (office) [phone] (fax)

## 2019-11-22 ENCOUNTER — Telehealth (HOSPITAL_COMMUNITY): Payer: Self-pay

## 2019-11-22 NOTE — Telephone Encounter (Signed)
Rx faxed to [facility] to renew oxygen to include increase of liters from 2 to 4.

## 2019-11-22 NOTE — Addendum Note (Signed)
Encounter addended by: Shonna Chock, CMA on: 16/60/6004 9:02 AM  Actions taken: Flowsheet accepted

## 2019-11-25 ENCOUNTER — Other Ambulatory Visit: Payer: Self-pay | Admitting: Physician Assistant

## 2019-11-25 DIAGNOSIS — M5136 Other intervertebral disc degeneration, lumbar region: Secondary | ICD-10-CM

## 2019-11-25 DIAGNOSIS — M170 Bilateral primary osteoarthritis of knee: Secondary | ICD-10-CM

## 2019-11-30 ENCOUNTER — Other Ambulatory Visit (HOSPITAL_COMMUNITY): Payer: Self-pay | Admitting: Cardiology

## 2019-11-30 DIAGNOSIS — I270 Primary pulmonary hypertension: Secondary | ICD-10-CM

## 2019-11-30 MED ORDER — OPSUMIT 10 MG PO TABS: 10.0000 mg | ORAL_TABLET | Freq: Every day | ORAL | 3 refills | Status: DC

## 2019-11-30 MED ORDER — ADEMPAS 2 MG PO TABS: 2.0000 mg | ORAL_TABLET | Freq: Three times a day (TID) | ORAL | 3 refills | Status: DC

## 2019-11-30 MED ORDER — SELEXIPAG 200 MCG PO TABS: 200.0000 ug | ORAL_TABLET | Freq: Two times a day (BID) | ORAL | 3 refills | Status: DC

## 2019-11-30 NOTE — Telephone Encounter (Signed)
Patient called to request updated rx's for speciality medications

## 2019-12-08 ENCOUNTER — Other Ambulatory Visit (HOSPITAL_COMMUNITY): Payer: Self-pay | Admitting: Cardiology

## 2019-12-08 MED ORDER — TORSEMIDE 20 MG PO TABS: 40.0000 mg | ORAL_TABLET | Freq: Two times a day (BID) | ORAL | 6 refills | Status: DC

## 2019-12-15 ENCOUNTER — Telehealth (HOSPITAL_COMMUNITY): Payer: Self-pay | Admitting: Pharmacist

## 2019-12-15 NOTE — Telephone Encounter (Signed)
Patient Advocate Encounter   Received notification from [facility] that prior authorization for Opsumit is required.   PA submitted on CoverMyMeds Key [id number] Status is pending   Will continue to follow.  Karle Plumber, PharmD, BCPS, BCCP, CPP Heart Failure Clinic Pharmacist [phone]

## 2019-12-16 NOTE — Telephone Encounter (Signed)
Advanced Heart Failure Patient Advocate Encounter  Prior Authorization for Opsumit has been approved.    Effective dates: 12/16/19 through 12/08/20  Amisadai Woodford, PharmD, BCPS, BCCP, CPP Heart Failure Clinic Pharmacist [phone]  

## 2019-12-22 ENCOUNTER — Other Ambulatory Visit (HOSPITAL_COMMUNITY): Payer: Self-pay | Admitting: Cardiology

## 2019-12-22 DIAGNOSIS — I270 Primary pulmonary hypertension: Secondary | ICD-10-CM

## 2019-12-29 ENCOUNTER — Other Ambulatory Visit: Payer: Self-pay | Admitting: *Deleted

## 2019-12-29 MED ORDER — ACCU-CHEK AVIVA PLUS W/DEVICE KIT: PACK | 0 refills | Status: DC

## 2019-12-29 MED ORDER — ACCU-CHEK AVIVA PLUS VI STRP: ORAL_STRIP | 12 refills | Status: DC

## 2019-12-29 MED ORDER — ACCU-CHEK SOFTCLIX LANCETS MISC: 3 refills | Status: DC

## 2019-12-29 MED ORDER — BD SWAB SINGLE USE REGULAR PADS: MEDICATED_PAD | 3 refills | Status: DC

## 2019-12-31 ENCOUNTER — Encounter: Payer: Self-pay | Admitting: Internal Medicine

## 2019-12-31 ENCOUNTER — Other Ambulatory Visit: Payer: Self-pay

## 2019-12-31 ENCOUNTER — Encounter: Payer: Self-pay | Admitting: Physician Assistant

## 2019-12-31 ENCOUNTER — Ambulatory Visit: Payer: Medicare PPO | Admitting: Internal Medicine

## 2019-12-31 DIAGNOSIS — R7303 Prediabetes: Secondary | ICD-10-CM | POA: Insufficient documentation

## 2019-12-31 DIAGNOSIS — J9611 Chronic respiratory failure with hypoxia: Secondary | ICD-10-CM | POA: Diagnosis not present

## 2019-12-31 DIAGNOSIS — I2721 Secondary pulmonary arterial hypertension: Secondary | ICD-10-CM

## 2019-12-31 DIAGNOSIS — J849 Interstitial pulmonary disease, unspecified: Secondary | ICD-10-CM | POA: Diagnosis not present

## 2019-12-31 DIAGNOSIS — J449 Chronic obstructive pulmonary disease, unspecified: Secondary | ICD-10-CM | POA: Diagnosis not present

## 2019-12-31 MED ORDER — ACCU-CHEK SOFTCLIX LANCETS MISC: 3 refills | Status: DC

## 2019-12-31 MED ORDER — ACCU-CHEK AVIVA PLUS W/DEVICE KIT: PACK | 0 refills | Status: AC

## 2019-12-31 MED ORDER — ACCU-CHEK AVIVA PLUS VI STRP: ORAL_STRIP | 12 refills | Status: DC

## 2019-12-31 MED ORDER — BD SWAB SINGLE USE REGULAR PADS: MEDICATED_PAD | 3 refills | Status: DC

## 2019-12-31 NOTE — Patient Instructions (Addendum)
I do recommend the covid vaccine whenever you can get it.  Make sure you check your oxygen saturations at highest level of activity to be sure it stays over 90% and adjust upward to maintain this level if needed but remember to turn it back to previous settings when you stop (to conserve your supply).    Please schedule a follow up visit in 6 months but call sooner if needed  - add 6 min walk on return/ revisit issue of NSIP

## 2019-12-31 NOTE — Addendum Note (Signed)
Addended by: Julious Payer D on: 12/31/2019 03:44 PM   Modules accepted: Orders

## 2019-12-31 NOTE — Telephone Encounter (Signed)
Dx R73.03 added

## 2019-12-31 NOTE — Progress Notes (Signed)
Subjective:    Patient ID: Kelly Donaldson, female    DOB: [birth year],   MRN: [id number]    Brief patient profile:  42 yobf quit smoking 2016  on noct 02 2lpm x 2013 and daytime doe x more than [employer] off 02 but losing ground gradually since onset  assoc with leg pain/ swelling = bilaterally already eval [place] pulmonary doctor = Koleen Nimrod rec wear 02 no dx so referred to pulmonary clinic 01/11/15 by Octavio Graves   History of Present Illness  01/11/2015 1st [facility] office visit/ Galilee Pierron   Chief Complaint  Patient presents with  . Pulmonary Consult    Referred by Dr. Melina Copa. Pt c/o DOE x 2 yrs worse x 3 months. She states DOE seems worse in the evenings. She gets out of breath walking up stairs or "doing too much". She also c/o cough- prod with clear sputum and has trouble swallowing often.   chronic doe x years much worse x 3 months assoc with dry daytime cough and mild dysphagia/ also worse leg swelling just using 02 at hs  Prev w/u in [place] "neg" per pt, only rx= 02 rec  We will request  the last discharge summary from [facility] to leave 02 off sitting   but wear 2lpm at bedtime and with walking always use = 2lpm  Or goal of over 90% if you have a monitor  The key is to stop smoking completely before smoking completely stops you!  Wear the elastic stockings as much as possible Try prilosec 30m  Take 30-60 min before first meal of the day and Pepcid 20 mg one bedtime until  Return (over the counter)  GERD diet   W/u   - pulmonary eval [place] last seen 12/17/12 with "neg CTa for PE, pfts ok except dlco 20%, echo c/w  PH, 02 dep hypoxemia > rec RHC and stop phenterimine" -Echo 09/15/14     LAE mildly dilated, as are RV and RA  - 01/11/2015   Walked 2lpm  x one lap @ 185 stopped due to  Sob, nl pace, no desat - Quit smoking 03/02/15  - PFTs 03/20/2015  Nl except for DLCO 26 corrects to 35%  - 03/20/2015   Walked 2lpm x one lap @ 185 stopped due to sob/ desat  corrected on 4lpm  - 04/07/2015 rec  referral to Dr MMarigene Ehlersfor Right heart Cath - Repeat Echo with bubble study 04/19/2015 > With injection of agitated saline intravenously there is appearance of bubbles in L sided chambers 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 > 05/11/15 Pos small PFO  rec as of 03/20/2015 2lpm 24/7 except 6lpm walking outside of house         05/09/2015 f/u ov/Alie Hardgrove re:  2lpm / up 6lpm pulsed with ex / off cigs x 02/2015  Chief Complaint  Patient presents with  . Follow-up    Pt c/o SOB with activity, PND, sinus congestion. Pt denies any chest congestion, cough.   nose is better off all rx but still drippy  rec Use 2lpm at rest is ok,  4lpm walking around the house but need 6lpm walking outside any distance and with exercise.   06/12/16 Final Odean Fester OV: Follow [facility]'s instructions re stopping medications you feel are making you sick We will set you up with Dr MLake Bells our pulmonary hypertension specialist, for a third opinion on options  Goal is to keep the 02 level above 90% at all times  [place] final ov 02/11/2019 Centrilobular emphysema: Start taking Anoro 1 puff daily no matter how you feel Keep using albuterol as needed for chest tightness wheezing or shortness of breath Practice good hand hygiene Stay active  Chronic respiratory failure with hypoxemia: Keep using 4 to 6 L of oxygen continuously as you are doing  Pulmonary hypertension: Continue taking the pulmonary vasodilators as directed by the advanced heart failure clinic Keep sodium intake less than 2 g a day Weigh daily Continue taking torsemide   12/31/2019  Re-establish /ov/Liam Bossman re:  PAH/ chronic resp failure/ GOLD 0 copd maint on anoro  Chief Complaint  Patient presents with  . Follow-up    Former patient of Dr Lake Bells. She feels like the Anoro works well and that her breathing has improved slightly since the last visit. No new co's.    Dyspnea:  Chases around grand daughter on 2.5 lpm    Cough: none  Sleeping: on side/ slt elevation hob  SABA use: has hfa not needing  / rarely use neb  02:  2.5 lpm hs and at rest and does not adjust  With activity    No obvious day to day or daytime variability or assoc excess/ purulent sputum or mucus plugs or hemoptysis or cp or chest tightness, subjective wheeze or overt sinus or hb symptoms.   Sleeping  without nocturnal  or early am exacerbation  of respiratory  c/o's or need for noct saba. Also denies any obvious fluctuation of symptoms with weather or environmental changes or other aggravating or alleviating factors except as outlined above   No unusual exposure hx or h/o childhood pna/ asthma or knowledge of premature birth.  Current Allergies, Complete Past Medical History, Past Surgical History, Family History, and Social History were reviewed in [employer] record.  ROS  The following are not active complaints unless bolded Hoarseness, sore throat, dysphagia, dental problems, itching, sneezing,  nasal congestion or discharge of excess mucus or purulent secretions, ear ache,   fever, chills, sweats, unintended wt loss or wt gain, classically pleuritic or exertional cp,  orthopnea pnd or arm/hand swelling  or leg swelling, presyncope, palpitations, abdominal pain, anorexia, nausea, vomiting, diarrhea  or change in bowel habits or change in bladder habits, change in stools or change in urine, dysuria, hematuria,  rash, arthralgias, visual complaints, headache, numbness, weakness or ataxia or problems with walking or coordination,  change in mood or  memory.        Current Meds  Medication Sig  . Accu-Chek Softclix Lancets lancets Check glucose BID Dx R73.03  . ADEMPAS 2 MG TABS Take one tablet three times daily. Dose changes may occur throughout the course of therapy as directed by your prescriber.  . Alcohol Swabs (B-D SINGLE USE SWABS REGULAR) PADS Check glucose BID Dx R73.03  . aspirin EC 81 MG tablet Take 1  tablet (81 mg total) by mouth daily.  . Blood Glucose Calibration (ACCU-CHEK AVIVA) SOLN 1 Units by In Vitro route every 30 (thirty) days.  . Blood Glucose Monitoring Suppl (ACCU-CHEK AVIVA PLUS) w/Device KIT Check glucose BID Dx R73.03  . DULoxetine (CYMBALTA) 60 MG capsule TAKE 2 CAPSULES(120 MG) BY MOUTH DAILY  . ferrous sulfate (FERROUSUL) 325 (65 FE) MG tablet Take 1 tablet (325 mg total) by mouth daily with breakfast.  . fluticasone (FLONASE) 50 MCG/ACT nasal spray Place 1 spray into both nostrils daily.  Marland Kitchen glucose blood (ACCU-CHEK AVIVA PLUS) test strip Check glucose BID Dx R73.03  .  HYDROcodone-homatropine (HYCODAN) 5-1.5 MG/5ML syrup Take 5 mLs by mouth every 6 (six) hours as needed for cough.  Marland Kitchen ipratropium-albuterol (DUONEB) 0.5-2.5 (3) MG/3ML SOLN INHALE THE CONTENTS OF 1 VIAL VIA NEBULIZER FOUR TIMES DAILY AS NEEDED  . macitentan (OPSUMIT) 10 MG tablet Take 1 tablet (10 mg total) by mouth daily.  . OXYGEN Take 2.5-4 L by mouth See admin instructions. 2.5 L with resting state & 4 L with exertion [facility]  . Polyethyl Glycol-Propyl Glycol (LUBRICANT EYE DROPS) 0.4-0.3 % SOLN Place 1 drop into both eyes daily.  . potassium chloride SA (KLOR-CON) 20 MEQ tablet TAKE 1 TABLET TWICE DAILY  . pravastatin (PRAVACHOL) 20 MG tablet Take 1 tablet (20 mg total) by mouth daily.  . Selexipag 200 MCG TABS Take 1 tablet (200 mcg total) by mouth 2 (two) times daily.  Marland Kitchen torsemide (DEMADEX) 20 MG tablet Take 2 tablets (40 mg total) by mouth 2 (two) times daily.  Marland Kitchen umeclidinium-vilanterol (ANORO ELLIPTA) 62.5-25 MCG/INH AEPB INHALE 1 PUFF INTO THE LUNGS DAILY.  Marland Kitchen Vitamin D, Ergocalciferol, (DRISDOL) 1.25 MG (50000 UT) CAPS capsule TAKE 1 CAPSULE EVERY MONDAY  . zolpidem (AMBIEN) 5 MG tablet Take 1 tablet (5 mg total) by mouth at bedtime.                      Objective:   Physical Exam  Obese amb bf nad  12/31/2019       201 05/09/15           255    03/20/15 254 lb (115.214 kg)    02/08/15 253 lb 6.4 oz (114.941 kg)  01/11/15 259 lb (117.482 kg)      Vital signs reviewed  12/31/2019  - Note at rest 02 sats  93  % on 2lpm POC      HEENT : pt wearing mask not removed for exam due to covid -19 concerns.    NECK :  without JVD/Nodes/TM/ nl carotid upstrokes bilaterally   LUNGS: no acc muscle use,  Nl contour chest which is clear to A and P bilaterally without cough on insp or exp maneuvers   CV:  RRR  no s3  II/ VI sem with slt increase in P2, and no edema   ABD:  soft and nontender with nl inspiratory excursion in the supine position. No bruits or organomegaly appreciated, bowel sounds nl  MS:  Nl gait/ ext warm without deformities, calf tenderness, cyanosis or clubbing No obvious joint restrictions   SKIN: warm and dry without lesions    NEURO:  alert, approp, nl sensorium with  no motor or cerebellar deficits apparent.      I personally reviewed images and agree with radiology impression as follows:   Chest HRCT 07/07/18 . CT pattern is considered indeterminate for usual interstitial pneumonia (UIP). 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 2. Diffuse bronchial wall thickening with mild centrilobular and paraseptal emphysema. 3. Dilatation of the pulmonic trunk (4.7 cm in diameter), suggestive of associated pulmonary arterial hypertension. 4. Aortic atherosclerosis, in addition to left circumflex coronary artery disease. Please note that although the presence of coronary artery calcium documents the presence of coronary artery disease, the severity of this disease and any potential stenosis cannot be assessed on this non-gated CT examination. Assessment for potential risk factor modification, dietary therapy or pharmacologic therapy may be warranted, if clinically indicated.  Aortic Atherosclerosis (ICD10-I70.0)  and Emphysema (ICD10-J43.9).       Assessment & Plan:

## 2020-01-01 ENCOUNTER — Encounter: Payer: Self-pay | Admitting: Internal Medicine

## 2020-01-01 DIAGNOSIS — J449 Chronic obstructive pulmonary disease, unspecified: Secondary | ICD-10-CM | POA: Insufficient documentation

## 2020-01-01 NOTE — Assessment & Plan Note (Signed)
08/2016 high-resolution CT scan of the chest: [place] review: Peripheral based interlobular septal thickening and groundglass consistent with fibrosis with some mild bronchiectasis, fibrotic changes appear to be more prominent in the lower lobes with upper lobe nodules, no air trapping noted on expiratory images, mediastinal lymphadenopathy stable per radiology  Chest HRCT 07/07/18 . CT pattern is considered indeterminate for usual interstitial pneumonia (UIP). 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

## 2020-01-01 NOTE — Assessment & Plan Note (Addendum)
Quit smoking 2016  - PFT's  06/18/18  FEV1 1.90 (82 % ) ratio 0.80  p no % improvement from saba p ? prior to study with DLCO  6.5 (23%) corrects to 2.23 (43%)  for alv volume and FV curve mild/mod curvature in effort indep portion of f/v > started on anoro and helped doe  CT chest 07/07/18 mild centrilobular and paraseptal emphysema.  She has very mild copd clinically but is convinced the anoro helps vs baseline c/w a [employer] pt so no change rx    Pt informed of the seriousness of COVID 19 infection as a direct risk to lung health  and safey and to close contacts and should continue to wear a facemask in public and minimize exposure to public locations but especially avoid any area or activity where non-close contacts are not observing distancing or wearing an appropriate face mask.  I strongly recommended vaccine when offered.    >>> f/u in 6 m          Each maintenance medication was reviewed in detail including emphasizing most importantly the difference between maintenance and prns and under what circumstances the prns are to be triggered using an action plan format where appropriate.  Total time for H and P for pt now seen by me in > 3 y, chart review, counseling, teaching device and generating customized AVS unique to this office visit / charting = 45 min

## 2020-01-01 NOTE — Assessment & Plan Note (Signed)
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

## 2020-01-01 NOTE — Assessment & Plan Note (Signed)
[name] - pulmonary eval Kelly Donaldson last seen 12/17/12 with "neg CTa for PE, pfts ok except dlco 20%, echo c/w  PH, 02 dep hypoxemia > rec RHC and stop phenterimine" -Echo 09/15/14     LAE mildly dilated, as are RV and RA  - 01/11/2015   Walked 2lpm  x one lap @ 185 stopped due to  Sob, nl pace, no desat - Quit smoking 03/02/15  - PFTs 03/20/2015  Nl except for DLCO 26 corrects to 35%  - 03/20/2015   Walked 2lpm x one lap @ 185 stopped due to sob/ desat corrected on 4lpm  - 04/07/2015 rec  referral to Dr Jearld Pies for Right heart Cath - Repeat Echo with bubble study 04/19/2015 > With injection of agitated saline intravenously there is appearance of bubbles in L sided chambers 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 - 05/09/2015   Walked 4lpm x one lap @ 185 stopped due to  88% one lap > 05/11/15 Pos small PFO   rec as of 12/31/2019  2.5 ilm 24/7 except titrate to keep > 90% with activity   Advised: Make sure you check your oxygen saturations at highest level of activity to be sure it stays over 90% and adjust upward to maintain this level if needed but remember to turn it back to previous settings when you stop (to conserve your supply).

## 2020-01-03 ENCOUNTER — Other Ambulatory Visit: Payer: Self-pay | Admitting: Physician Assistant

## 2020-01-03 ENCOUNTER — Telehealth: Payer: Self-pay | Admitting: *Deleted

## 2020-01-03 NOTE — Telephone Encounter (Signed)
In her record I do not have her as being on Stiolto, I have her as being on Anoro

## 2020-01-03 NOTE — Telephone Encounter (Signed)
Fax from [facility] pharmacy  Stiolto respimat inhalation spray is non formulary Pt request alternative Please advise

## 2020-01-10 ENCOUNTER — Other Ambulatory Visit (HOSPITAL_COMMUNITY): Payer: Self-pay

## 2020-01-10 DIAGNOSIS — I270 Primary pulmonary hypertension: Secondary | ICD-10-CM

## 2020-01-10 MED ORDER — ADEMPAS 2 MG PO TABS: ORAL_TABLET | ORAL | 1 refills | Status: DC

## 2020-01-14 ENCOUNTER — Telehealth (HOSPITAL_COMMUNITY): Payer: Self-pay | Admitting: Pharmacist

## 2020-01-14 NOTE — Telephone Encounter (Signed)
Advanced Heart Failure Patient Advocate Encounter  Prior athorization for Adempas has been approved.    Effective dates: 01/14/20 through 12/08/20  Karle Plumber, PharmD, BCPS, BCCP, CPP Heart Failure Clinic Pharmacist [phone]

## 2020-02-02 ENCOUNTER — Telehealth: Payer: Self-pay | Admitting: Internal Medicine

## 2020-02-02 NOTE — Telephone Encounter (Signed)
Left message for patient to call back  

## 2020-02-03 ENCOUNTER — Telehealth: Payer: Self-pay | Admitting: Internal Medicine

## 2020-02-03 MED ORDER — ANORO ELLIPTA 62.5-25 MCG/INH IN AEPB: 1.0000 | INHALATION_SPRAY | Freq: Every day | RESPIRATORY_TRACT | 0 refills | Status: DC

## 2020-02-03 NOTE — Telephone Encounter (Signed)
Unless very familiar already with the device, rec anoro sample and ov before this runs out to teach stiolto and do the paperwork for assistance at that point as well, ok to see NP for this

## 2020-02-03 NOTE — Telephone Encounter (Signed)
Spoke with the pt and notified of response per Dr [name] She was given 2 boxes of anoro and scheduled appt with MW for 02/17/20

## 2020-02-03 NOTE — Telephone Encounter (Signed)
Called and spoke with Patient. Patient stated she really likes Anoro and does not want to use any other inhaler, if possible.  Patient asked if we can start a PA for Anoro this week. Will hold message in triage until 02/03/20- to start Anoro PA

## 2020-02-03 NOTE — Telephone Encounter (Signed)
Called and spoke to pt. Pt states she was informed by the pharmacy that the Anoro isnt covered by Stiolto is, pt has not tried [employer] before. Pt stated the Stiolto would be $64 per inhaler and that is a little too expensive for pt, she would not be able to pay for this every month. Pt is requesting a sample. Would also need to enroll her in patient assistance if MW wants pt on Stiolto.   Dr. [name] please advise if ok to change from Anoro to [facility] due to coverage. Thanks.    Instructions  I do recommend the covid vaccine whenever you can get it.   Make sure you check your oxygen saturations at highest level of activity to be sure it stays over 90% and adjust upward to maintain this level if needed but remember to turn it back to previous settings when you stop (to conserve your supply).      Please schedule a follow up visit in 6 months but call sooner if needed  - add 6 min walk on return/ revisit issue of NSIP

## 2020-02-03 NOTE — Telephone Encounter (Signed)
Patient is returning phone call.  Patient phone number is [phone].

## 2020-02-04 NOTE — Telephone Encounter (Signed)
PA submitted and will forward to my basket to f/u on eborah Niess Key: [id number]

## 2020-02-07 ENCOUNTER — Other Ambulatory Visit (HOSPITAL_COMMUNITY): Payer: Self-pay

## 2020-02-07 DIAGNOSIS — I270 Primary pulmonary hypertension: Secondary | ICD-10-CM

## 2020-02-07 MED ORDER — SELEXIPAG 200 MCG PO TABS: 200.0000 ug | ORAL_TABLET | Freq: Two times a day (BID) | ORAL | 3 refills | Status: DC

## 2020-02-07 NOTE — Telephone Encounter (Signed)
Rx faxed to [facility]

## 2020-02-08 MED ORDER — ANORO ELLIPTA 62.5-25 MCG/INH IN AEPB: 1.0000 | INHALATION_SPRAY | Freq: Every day | RESPIRATORY_TRACT | 0 refills | Status: DC

## 2020-02-08 NOTE — Telephone Encounter (Addendum)
Called CMM to check status of Anoro PA.  Advised that there was already a PA that had been started on file. Need to call [employer] directly at [phone].  Called and was advised that she could have a 90 day supply x 1 only without PA being needed and then PA would be required. Will send for 90 day supply  Pt aware.

## 2020-02-08 NOTE — Addendum Note (Signed)
Addended by: Christen Butter on: 02/08/2020 12:13 PM   Modules accepted: Orders

## 2020-02-11 ENCOUNTER — Ambulatory Visit (INDEPENDENT_AMBULATORY_CARE_PROVIDER_SITE_OTHER): Payer: Medicare PPO | Admitting: Physician Assistant

## 2020-02-11 ENCOUNTER — Other Ambulatory Visit: Payer: Self-pay

## 2020-02-11 ENCOUNTER — Encounter: Payer: Self-pay | Admitting: Physician Assistant

## 2020-02-11 VITALS — BP 98/64 | HR 69 | Temp 99.1°F | Ht 67.0 in | Wt 203.8 lb

## 2020-02-11 DIAGNOSIS — I509 Heart failure, unspecified: Secondary | ICD-10-CM

## 2020-02-11 DIAGNOSIS — F331 Major depressive disorder, recurrent, moderate: Secondary | ICD-10-CM | POA: Diagnosis not present

## 2020-02-11 DIAGNOSIS — M5136 Other intervertebral disc degeneration, lumbar region: Secondary | ICD-10-CM | POA: Diagnosis not present

## 2020-02-11 DIAGNOSIS — R4586 Emotional lability: Secondary | ICD-10-CM | POA: Diagnosis not present

## 2020-02-11 MED ORDER — ARIPIPRAZOLE 5 MG PO TABS: 5.0000 mg | ORAL_TABLET | Freq: Every day | ORAL | 2 refills | Status: DC

## 2020-02-17 ENCOUNTER — Ambulatory Visit: Payer: Medicare PPO | Admitting: Internal Medicine

## 2020-02-17 DIAGNOSIS — R4586 Emotional lability: Secondary | ICD-10-CM | POA: Insufficient documentation

## 2020-02-17 NOTE — Progress Notes (Signed)
BP 98/64   Pulse 69   Temp 99.1 F (37.3 C)   Ht '5\' 7"'$  (1.702 m)   Wt 203 lb 12.8 oz (92.4 kg)   SpO2 95% Comment: on 2 liters of 02  BMI 31.92 kg/m    Subjective:    Patient ID: Kelly Donaldson, female    DOB: [birth year], 64 y.o.   MRN: [id number]  HPI 1. Mood swing  2. DDD (degenerative disc disease), lumbar  3. Congestive heart failure, unspecified HF chronicity, unspecified heart failure type ([place])  4. Moderate episode of recurrent major depressive disorder [facility])   HPI: Kelly Donaldson is a 64 y.o. female presenting on 02/11/2020 for Follow-up  This patient comes in having difficulty with the medication that she started.  She took the Cymbalta since last month.  States she has had increased mood and difficulty with medication and has started to come off of it.  She had Celexa still left at home.  Can have her restart that.  Were also can add Abilify 5 mg 1 daily to see if this can help with the depression and mood swings.  Patient still under the care of cardiology, pulmonology, pain management.  All of her medications are refilled through them at this time related to those diseases.  Past Medical History:  Diagnosis Date  . Allergy   . Depression   . GERD (gastroesophageal reflux disease)   . Hyperlipidemia   . Oxygen dependent   . Shortness of breath dyspnea   . Varicose veins    Relevant past medical, surgical, family and social history reviewed and updated as indicated. Interim medical history since our last visit reviewed. Allergies and medications reviewed and updated. DATA REVIEWED: CHART IN EPIC  Family History reviewed for pertinent findings.  Review of Systems  Constitutional: Negative.   HENT: Negative.   Eyes: Negative.   Respiratory: Negative.   Gastrointestinal: Negative.   Genitourinary: Negative.   Musculoskeletal: Positive for arthralgias, back pain and gait problem.  Psychiatric/Behavioral: Positive for agitation, decreased concentration and  dysphoric mood. The patient is nervous/anxious.     Allergies as of 02/11/2020      Reactions   Gabapentin Other (See Comments)   Pt states that she can not take with antidepressants   Latex Rash      Medication List       Accurate as of February 11, 2020 11:59 PM. If you have any questions, ask your nurse or doctor.        STOP taking these medications   DULoxetine 60 MG capsule Commonly known as: CYMBALTA Stopped by: Terald Sleeper, PA-C     TAKE these medications   Accu-Chek Aviva Plus test strip Generic drug: glucose blood Check glucose BID Dx R73.03   Accu-Chek Aviva Plus w/Device Kit Check glucose BID Dx R73.03   Accu-Chek Aviva Soln 1 Units by In Vitro route every 30 (thirty) days.   Accu-Chek Softclix Lancets lancets Check glucose BID Dx R73.03   Adempas 2 MG Tabs Generic drug: Riociguat Take one tablet three times daily. Dose changes may occur throughout the course of therapy as directed by your prescriber.   Anoro Ellipta 62.5-25 MCG/INH Aepb Generic drug: umeclidinium-vilanterol INHALE 1 PUFF INTO THE LUNGS DAILY.   Anoro Ellipta 62.5-25 MCG/INH Aepb Generic drug: umeclidinium-vilanterol Inhale 1 puff into the lungs daily.   Anoro Ellipta 62.5-25 MCG/INH Aepb Generic drug: umeclidinium-vilanterol Inhale 1 puff into the lungs daily.   ARIPiprazole 5 MG tablet Commonly  known as: Abilify Take 1 tablet (5 mg total) by mouth daily. Started by: Terald Sleeper, PA-C   aspirin EC 81 MG tablet Take 1 tablet (81 mg total) by mouth daily.   B-D SINGLE USE SWABS REGULAR Pads Check glucose BID Dx R73.03   ferrous sulfate 325 (65 FE) MG tablet Commonly known as: FerrouSul Take 1 tablet (325 mg total) by mouth daily with breakfast.   fluticasone 50 MCG/ACT nasal spray Commonly known as: FLONASE Place 1 spray into both nostrils daily.   HYDROcodone-homatropine 5-1.5 MG/5ML syrup Commonly known as: HYCODAN Take 5 mLs by mouth every 6 (six) hours as needed  for cough.   ipratropium-albuterol 0.5-2.5 (3) MG/3ML Soln Commonly known as: DUONEB INHALE THE CONTENTS OF 1 VIAL VIA NEBULIZER FOUR TIMES DAILY AS NEEDED   Lubricant Eye Drops 0.4-0.3 % Soln Generic drug: Polyethyl Glycol-Propyl Glycol Place 1 drop into both eyes daily.   Opsumit 10 MG tablet Generic drug: macitentan Take 1 tablet (10 mg total) by mouth daily.   OXYGEN Take 2.5-4 L by mouth See admin instructions. 2.5 L with resting state & 4 L with exertion [facility]   potassium chloride SA 20 MEQ tablet Commonly known as: KLOR-CON TAKE 1 TABLET TWICE DAILY   pravastatin 20 MG tablet Commonly known as: PRAVACHOL Take 1 tablet (20 mg total) by mouth daily.   Selexipag 200 MCG Tabs Take 1 tablet (200 mcg total) by mouth 2 (two) times daily.   torsemide 20 MG tablet Commonly known as: DEMADEX Take 2 tablets (40 mg total) by mouth 2 (two) times daily.   Vitamin D (Ergocalciferol) 1.25 MG (50000 UNIT) Caps capsule Commonly known as: DRISDOL TAKE 1 CAPSULE EVERY MONDAY   zolpidem 5 MG tablet Commonly known as: AMBIEN Take 1 tablet (5 mg total) by mouth at bedtime.          Objective:    BP 98/64   Pulse 69   Temp 99.1 F (37.3 C)   Ht '5\' 7"'$  (1.702 m)   Wt 203 lb 12.8 oz (92.4 kg)   SpO2 95% Comment: on 2 liters of 02  BMI 31.92 kg/m   Allergies  Allergen Reactions  . Gabapentin Other (See Comments)    Pt states that she can not take with antidepressants  . Latex Rash    Wt Readings from Last 3 Encounters:  02/11/20 203 lb 12.8 oz (92.4 kg)  12/31/19 201 lb 12.8 oz (91.5 kg)  11/19/19 199 lb (90.3 kg)    Physical Exam Constitutional:      General: She is not in acute distress.    Appearance: Normal appearance. She is well-developed.  HENT:     Head: Normocephalic and atraumatic.  Cardiovascular:     Rate and Rhythm: Normal rate.  Pulmonary:     Effort: Pulmonary effort is normal.  Skin:    General: Skin is warm and dry.     Findings: No  rash.  Neurological:     Mental Status: She is alert and oriented to person, place, and time.     Deep Tendon Reflexes: Reflexes are normal and symmetric.     Results for orders placed or performed in visit on 11/11/19  CBC with Differential/Platelet  Result Value Ref Range   WBC 9.0 3.4 - 10.8 x10E3/uL   RBC 4.43 3.77 - 5.28 x10E6/uL   Hemoglobin 12.2 11.1 - 15.9 g/dL   Hematocrit 37.7 34.0 - 46.6 %   MCV 85 79 - 97 fL  MCH 27.5 26.6 - 33.0 pg   MCHC 32.4 31.5 - 35.7 g/dL   RDW 14.6 11.7 - 15.4 %   Platelets 168 150 - 450 x10E3/uL   Neutrophils 63 Not Estab. %   Lymphs 22 Not Estab. %   Monocytes 7 Not Estab. %   Eos 7 Not Estab. %   Basos 1 Not Estab. %   Neutrophils Absolute 5.6 1.4 - 7.0 x10E3/uL   Lymphocytes Absolute 2.0 0.7 - 3.1 x10E3/uL   Monocytes Absolute 0.6 0.1 - 0.9 x10E3/uL   EOS (ABSOLUTE) 0.7 (H) 0.0 - 0.4 x10E3/uL   Basophils Absolute 0.1 0.0 - 0.2 x10E3/uL   Immature Granulocytes 0 Not Estab. %   Immature Grans (Abs) 0.0 0.0 - 0.1 x10E3/uL  CMP14+EGFR  Result Value Ref Range   Glucose 79 65 - 99 mg/dL   BUN 16 8 - 27 mg/dL   Creatinine, Ser 1.00 0.57 - 1.00 mg/dL   GFR calc non Af Amer 60 >59 mL/min/1.73   GFR calc Af Amer 69 >59 mL/min/1.73   BUN/Creatinine Ratio 16 12 - 28   Sodium 140 134 - 144 mmol/L   Potassium 4.3 3.5 - 5.2 mmol/L   Chloride 97 96 - 106 mmol/L   CO2 29 20 - 29 mmol/L   Calcium 8.9 8.7 - 10.3 mg/dL   Total Protein 6.7 6.0 - 8.5 g/dL   Albumin 4.1 3.8 - 4.8 g/dL   Globulin, Total 2.6 1.5 - 4.5 g/dL   Albumin/Globulin Ratio 1.6 1.2 - 2.2   Bilirubin Total 0.4 0.0 - 1.2 mg/dL   Alkaline Phosphatase 61 39 - 117 IU/L   AST 20 0 - 40 IU/L   ALT 12 0 - 32 IU/L  Lipid Panel  Result Value Ref Range   Cholesterol, Total 213 (H) 100 - 199 mg/dL   Triglycerides 67 0 - 149 mg/dL   HDL 102 >39 mg/dL   VLDL Cholesterol Cal 12 5 - 40 mg/dL   LDL Chol Calc (NIH) 99 0 - 99 mg/dL   Chol/HDL Ratio 2.1 0.0 - 4.4 ratio  TSH  Result  Value Ref Range   TSH 0.867 0.450 - 4.500 uIU/mL      Assessment & Plan:   1. Mood swing - ARIPiprazole (ABILIFY) 5 MG tablet; Take 1 tablet (5 mg total) by mouth daily.  Dispense: 30 tablet; Refill: 2  2. DDD (degenerative disc disease), lumbar Continue with orthopedics, neurosurgery, pain management  3. Congestive heart failure, unspecified HF chronicity, unspecified heart failure type ([place]) Continue with cardiovascular and pulmonology  4. Moderate episode of recurrent major depressive disorder ([place]) Return to Celexa discontinue Cymbalta.  Add Abilify 5 mg 1 daily plan to recheck in 4 weeks   Continue all other maintenance medications as listed above.  Follow up plan: Return in about 3 months (around 05/13/2020).  Educational handout given for Roslyn PA-C [place] [address]  [place], [place] [address] [phone]   02/17/2020, 7:19 PM

## 2020-02-21 ENCOUNTER — Other Ambulatory Visit: Payer: Self-pay

## 2020-02-21 ENCOUNTER — Encounter: Payer: Self-pay | Admitting: Internal Medicine

## 2020-02-21 ENCOUNTER — Ambulatory Visit: Payer: Medicare PPO | Admitting: Internal Medicine

## 2020-02-21 DIAGNOSIS — J9611 Chronic respiratory failure with hypoxia: Secondary | ICD-10-CM

## 2020-02-21 DIAGNOSIS — I2721 Secondary pulmonary arterial hypertension: Secondary | ICD-10-CM | POA: Diagnosis not present

## 2020-02-21 DIAGNOSIS — J449 Chronic obstructive pulmonary disease, unspecified: Secondary | ICD-10-CM

## 2020-02-21 MED ORDER — ALBUTEROL SULFATE HFA 108 (90 BASE) MCG/ACT IN AERS: 2.0000 | INHALATION_SPRAY | RESPIRATORY_TRACT | Status: DC | PRN

## 2020-02-21 MED ORDER — STIOLTO RESPIMAT 2.5-2.5 MCG/ACT IN AERS: 2.0000 | INHALATION_SPRAY | Freq: Every day | RESPIRATORY_TRACT | 3 refills | Status: DC

## 2020-02-21 MED ORDER — STIOLTO RESPIMAT 2.5-2.5 MCG/ACT IN AERS: 2.0000 | INHALATION_SPRAY | Freq: Every day | RESPIRATORY_TRACT | 0 refills | Status: DC

## 2020-02-21 NOTE — Patient Instructions (Addendum)
Try albuterol (? Proair)  15 min before an activity that you know would make you short of breath and see if it makes any difference and if makes none then don't take it after activity unless you can't catch your breath.   Otherwise only use your albuterol as a rescue medication to be used if you can't catch your breath by resting or doing a relaxed purse lip breathing pattern.  - The less you use it, the better it will work when you need it. - Ok to use up to 2 puffs  every 4 hours if you must but call for immediate appointment if use goes up over your usual need - Don't leave home without it !!  (think of it like the spare tire for your car)    Stop anoro and start stiolto x 2 puffs each am   Please schedule a follow up visit in 3 months but call sooner if needed in [place] office

## 2020-02-21 NOTE — Progress Notes (Signed)
Subjective:    Patient ID: Kelly Donaldson, female    DOB: Jan 10, 1956,   MRN: [id number]    Brief patient profile:  47 yobf quit smoking 2016  on noct 02 2lpm x 2013 and daytime doe x more than [employer] off 02 but losing ground gradually since onset  assoc with leg pain/ swelling = bilaterally already eval [place] pulmonary doctor = Koleen Nimrod rec wear 02 no dx so referred to pulmonary clinic 01/11/15 by Octavio Graves   History of Present Illness  01/11/2015 1st [facility] office visit/ Latosha Gaylord   Chief Complaint  Patient presents with  . Pulmonary Consult    Referred by Dr. Melina Copa. Pt c/o DOE x 2 yrs worse x 3 months. She states DOE seems worse in the evenings. She gets out of breath walking up stairs or "doing too much". She also c/o cough- prod with clear sputum and has trouble swallowing often.   chronic doe x years much worse x 3 months assoc with dry daytime cough and mild dysphagia/ also worse leg swelling just using 02 at hs  Prev w/u in [place] "neg" per pt, only rx= 02 rec  We will request  the last discharge summary from [facility] to leave 02 off sitting   but wear 2lpm at bedtime and with walking always use = 2lpm  Or goal of over 90% if you have a monitor  The key is to stop smoking completely before smoking completely stops you!  Wear the elastic stockings as much as possible Try prilosec '20mg'$   Take 30-60 min before first meal of the day and Pepcid 20 mg one bedtime until  Return (over the counter)  GERD diet   W/u   - pulmonary eval [place] last seen 12/17/12 with "neg CTa for PE, pfts ok except dlco 20%, echo c/w  PH, 02 dep hypoxemia > rec RHC and stop phenterimine" -Echo 09/15/14     LAE mildly dilated, as are RV and RA  - 01/11/2015   Walked 2lpm  x one lap @ 185 stopped due to  Sob, nl pace, no desat - Quit smoking 03/02/15  - PFTs 03/20/2015  Nl except for DLCO 26 corrects to 35%  - 03/20/2015   Walked 2lpm x one lap @ 185 stopped due to sob/ desat  corrected on 4lpm  - 04/07/2015 rec  referral to Dr Marigene Ehlers for Right heart Cath - Repeat Echo with bubble study 04/19/2015 > With injection of agitated saline intravenously there is appearance of bubbles in L sided chambers 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 > 05/11/15 Pos small PFO  rec as of 03/20/2015 2lpm 24/7 except 6lpm walking outside of house         05/09/2015 f/u ov/Arisa Congleton re:  2lpm / up 6lpm pulsed with ex / off cigs x 02/2015  Chief Complaint  Patient presents with  . Follow-up    Pt c/o SOB with activity, PND, sinus congestion. Pt denies any chest congestion, cough.   nose is better off all rx but still drippy  rec Use 2lpm at rest is ok,  4lpm walking around the house but need 6lpm walking outside any distance and with exercise.   06/12/16 Final Venia Riveron OV: Follow [facility] instructions re stopping medications you feel are making you sick We will set you up with Dr Lake Bells, our pulmonary hypertension specialist, for a third opinion on options  Goal is to keep the 02 level above 90% at all times  [place] final ov 02/11/2019 Centrilobular emphysema: Start taking Anoro 1 puff daily no matter how you feel Keep using albuterol as needed for chest tightness wheezing or shortness of breath Practice good hand hygiene Stay active  Chronic respiratory failure with hypoxemia: Keep using 4 to 6 L of oxygen continuously as you are doing  Pulmonary hypertension: Continue taking the pulmonary vasodilators as directed by the advanced heart failure clinic Keep sodium intake less than 2 g a day Weigh daily Continue taking torsemide   12/31/2019  Re-establish /ov/Lexis Potenza re:  PAH/ chronic resp failure/ GOLD 0 copd maint on anoro  Chief Complaint  Patient presents with  . Follow-up    Former patient of Dr Lake Bells. She feels like the Anoro works well and that her breathing has improved slightly since the last visit. No new co's.   Dyspnea:  Chases around grand daughter on 2.5 lpm    Cough: none  Sleeping: on side/ slt elevation hob  SABA use: has hfa not needing  / rarely use neb  02:  2.5 lpm hs and at  rec I do recommend the covid vaccine whenever you can get it. Make sure you check your oxygen saturations  Please schedule a follow up visit in 6 months but call sooner if needed  - add 6 min walk on return/ revisit issue of NSIP   02/21/2020  f/u ov/Mariellen Blaney re: GOLD 0 copd / insurance wants her changed to [employer] Complaint  Patient presents with  . Follow-up    Having issues with insurance covering the anoro- they do cover stioto.    Dyspnea:  Chases grand kids around on  3lpm  Cough: none  Sleeping: slt hob electric SABA use: rarely using ? proair  02: 2lpm then up to 3 chasing kids    No obvious day to day or daytime variability or assoc excess/ purulent sputum or mucus plugs or hemoptysis or cp or chest tightness, subjective wheeze or overt sinus or hb symptoms.   Sleeping  without nocturnal  or early am exacerbation  of respiratory  c/o's or need for noct saba. Also denies any obvious fluctuation of symptoms with weather or environmental changes or other aggravating or alleviating factors except as outlined above   No unusual exposure hx or h/o childhood pna/ asthma or knowledge of premature birth.  Current Allergies, Complete Past Medical History, Past Surgical History, Family History, and Social History were reviewed in [employer] record.  ROS  The following are not active complaints unless bolded Hoarseness, sore throat, dysphagia, dental problems, itching, sneezing,  nasal congestion or discharge of excess mucus or purulent secretions, ear ache,   fever, chills, sweats, unintended wt loss or wt gain, classically pleuritic or exertional cp,  orthopnea pnd or arm/hand swelling  or leg swelling, presyncope, palpitations, abdominal pain, anorexia, nausea, vomiting, diarrhea  or change in bowel habits or change in bladder habits,  change in stools or change in urine, dysuria, hematuria,  rash, arthralgias, visual complaints, headache, numbness, weakness or ataxia or problems with walking or coordination,  change in mood= anxious or  memory.        Current Meds  Medication Sig  . Accu-Chek Softclix Lancets lancets Check glucose BID Dx R73.03  . ADEMPAS 2 MG TABS Take one tablet three times daily. Dose changes may occur throughout the course of therapy as directed by your prescriber.  . Alcohol Swabs (B-D SINGLE USE SWABS REGULAR) PADS Check glucose BID Dx R73.03  .  aspirin EC 81 MG tablet Take 1 tablet (81 mg total) by mouth daily.  . Blood Glucose Calibration (ACCU-CHEK AVIVA) SOLN 1 Units by In Vitro route every 30 (thirty) days.  . Blood Glucose Monitoring Suppl (ACCU-CHEK AVIVA PLUS) w/Device KIT Check glucose BID Dx R73.03  . ferrous sulfate (FERROUSUL) 325 (65 FE) MG tablet Take 1 tablet (325 mg total) by mouth daily with breakfast.  . fluticasone (FLONASE) 50 MCG/ACT nasal spray Place 1 spray into both nostrils daily.  Marland Kitchen glucose blood (ACCU-CHEK AVIVA PLUS) test strip Check glucose BID Dx R73.03  . HYDROcodone-homatropine (HYCODAN) 5-1.5 MG/5ML syrup Take 5 mLs by mouth every 6 (six) hours as needed for cough.  Marland Kitchen ipratropium-albuterol (DUONEB) 0.5-2.5 (3) MG/3ML SOLN INHALE THE CONTENTS OF 1 VIAL VIA NEBULIZER FOUR TIMES DAILY AS NEEDED  . macitentan (OPSUMIT) 10 MG tablet Take 1 tablet (10 mg total) by mouth daily.  . OXYGEN Take 2.5-4 L by mouth See admin instructions. 2.5 L with resting state & 4 L with exertion [employer]  . Polyethyl Glycol-Propyl Glycol (LUBRICANT EYE DROPS) 0.4-0.3 % SOLN Place 1 drop into both eyes daily.  . potassium chloride SA (KLOR-CON) 20 MEQ tablet TAKE 1 TABLET TWICE DAILY  . pravastatin (PRAVACHOL) 20 MG tablet Take 1 tablet (20 mg total) by mouth daily.  . Selexipag 200 MCG TABS Take 1 tablet (200 mcg total) by mouth 2 (two) times daily.  Marland Kitchen torsemide (DEMADEX) 20 MG tablet Take 2  tablets (40 mg total) by mouth 2 (two) times daily.  Marland Kitchen umeclidinium-vilanterol (ANORO ELLIPTA) 62.5-25 MCG/INH AEPB INHALE 1 PUFF INTO THE LUNGS DAILY.  Marland Kitchen Vitamin D, Ergocalciferol, (DRISDOL) 1.25 MG (50000 UT) CAPS capsule TAKE 1 CAPSULE EVERY MONDAY  . zolpidem (AMBIEN) 5 MG tablet Take 1 tablet (5 mg total) by mouth at bedtime.                Objective:   Physical Exam  Pleasant amb bf nad   02/21/2020       209  12/31/2019       201 05/09/15           255    03/20/15 254 lb (115.214 kg)  02/08/15 253 lb 6.4 oz (114.941 kg)  01/11/15 259 lb (117.482 kg)     Vital signs reviewed  02/21/2020  - Note at rest 02 sats  98% on   2lpm POC     HEENT : pt wearing mask not removed for exam due to covid - 19 concerns.   NECK :  without JVD/Nodes/TM/ nl carotid upstrokes bilaterally   LUNGS: no acc muscle use,  Min barrel  contour chest wall with bilateral  slightly decreased bs s audible wheeze and  without cough on insp or exp maneuvers and min  Hyperresonant  to  percussion bilaterally     CV:  RRR  no s3 II/VI sem with slt  increase in P2, and no edema   ABD:  soft and nontender with pos end  insp Hoover's  in the supine position. No bruits or organomegaly appreciated, bowel sounds nl  MS:   Nl gait/  ext warm without deformities, calf tenderness, cyanosis or clubbing No obvious joint restrictions   SKIN: warm and dry without lesions    NEURO:  alert, approp, nl sensorium with  no motor or cerebellar deficits apparent.            Assessment & Plan:

## 2020-02-22 ENCOUNTER — Encounter: Payer: Self-pay | Admitting: Internal Medicine

## 2020-02-22 NOTE — Assessment & Plan Note (Addendum)
Quit smoking 2016  - PFT's  06/18/18  FEV1 1.90 (82 % ) ratio 0.80  p no % improvement from saba p ? prior to study with DLCO  6.5 (23%) corrects to 2.23 (43%)  for alv volume and FV curve mild/mod curvature in effort indep portion of f/v > started on anoro and helped doe  CT chest 07/07/18 mild centrilobular and paraseptal emphysema. - 02/21/2020  After extensive coaching inhaler device,  effectiveness =    90% with smi > changed to stiolto per insurance req    Pt is Group B in terms of symptom/risk and laba/lama therefore appropriate rx at this point >>>  stiolto approp but as she is so mild may be able to just use one pff each am and get the same benefit at half the cost   Advised:  formulary restrictions will be an ongoing challenge for the forseable future and I would be happy to pick an alternative if the pt will first  provide me a list of them -  pt  will need to return here for training for any new device that is required eg dpi vs hfa vs respimat.    In the meantime we can always provide samples so that the patient never runs out of any needed respiratory medications.

## 2020-02-22 NOTE — Assessment & Plan Note (Addendum)
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  Total time for H and P, chart review, counseling, teaching device and generating customized AVS unique to this office visit / charting = 30 min

## 2020-02-22 NOTE — Assessment & Plan Note (Signed)
[name] - pulmonary eval Kelly Donaldson last seen 12/17/12 with "neg CTa for PE, pfts ok except dlco 20%, echo c/w  PH, 02 dep hypoxemia > rec RHC and stop phenterimine" -Echo 09/15/14     LAE mildly dilated, as are RV and RA  - 01/11/2015   Walked 2lpm  x one lap @ 185 stopped due to  Sob, nl pace, no desat - Quit smoking 03/02/15  - PFTs 03/20/2015  Nl except for DLCO 26 corrects to 35%  - 03/20/2015   Walked 2lpm x one lap @ 185 stopped due to sob/ desat corrected on 4lpm  - 04/07/2015 rec  referral to Dr Jearld Pies for Right heart Cath - Repeat Echo with bubble study 04/19/2015 > With injection of agitated saline intravenously there is appearance of bubbles in L sided chambers 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 - 05/09/2015   Walked 4lpm x one lap @ 185 stopped due to  88% one lap > 05/11/15 Pos small PFO   As of 02/21/2020  = 2lpm hs and rest/ up to 3lpm with exertion

## 2020-02-28 ENCOUNTER — Telehealth (HOSPITAL_COMMUNITY): Payer: Self-pay | Admitting: Pharmacist

## 2020-02-28 NOTE — Telephone Encounter (Signed)
Advanced Heart Failure Patient Advocate Encounter  Prior Authorization for Uptravi 200 mcg has been approved.    Effective dates: 02/28/20 through 12/08/20  Karle Plumber, PharmD, BCPS, BCCP, CPP Heart Failure Clinic Pharmacist [phone]

## 2020-02-28 NOTE — Telephone Encounter (Signed)
Patient Advocate Encounter   Received notification from [facility] that prior authorization for Kelly Donaldson is required.   PA submitted via phone Key [id number] Status is pending   Will continue to follow.  Karle Plumber, PharmD, BCPS, BCCP, CPP Heart Failure Clinic Pharmacist [phone]

## 2020-02-28 NOTE — Telephone Encounter (Signed)
Patient has [employer] to assist with copay for pulmonary hypertension medications.  ID: [id number] Group: [id number] BIN: [id number] PCN: [id number]   Provided information to [facility] so they can fill her Uptravi. Patient is aware.

## 2020-03-02 ENCOUNTER — Other Ambulatory Visit: Payer: Self-pay | Admitting: Internal Medicine

## 2020-03-02 MED ORDER — STIOLTO RESPIMAT 2.5-2.5 MCG/ACT IN AERS: 2.0000 | INHALATION_SPRAY | Freq: Every day | RESPIRATORY_TRACT | 3 refills | Status: DC

## 2020-03-09 ENCOUNTER — Other Ambulatory Visit (HOSPITAL_COMMUNITY): Payer: Self-pay

## 2020-03-09 DIAGNOSIS — I270 Primary pulmonary hypertension: Secondary | ICD-10-CM

## 2020-03-09 MED ORDER — ADEMPAS 2 MG PO TABS: 2.0000 mg | ORAL_TABLET | Freq: Three times a day (TID) | ORAL | 3 refills | Status: DC

## 2020-03-13 ENCOUNTER — Other Ambulatory Visit: Payer: Self-pay | Admitting: *Deleted

## 2020-03-13 MED ORDER — STIOLTO RESPIMAT 2.5-2.5 MCG/ACT IN AERS: 2.0000 | INHALATION_SPRAY | Freq: Every day | RESPIRATORY_TRACT | 3 refills | Status: DC

## 2020-03-15 ENCOUNTER — Other Ambulatory Visit: Payer: Self-pay | Admitting: *Deleted

## 2020-03-15 MED ORDER — TORSEMIDE 20 MG PO TABS: 40.0000 mg | ORAL_TABLET | Freq: Two times a day (BID) | ORAL | 0 refills | Status: DC

## 2020-03-21 ENCOUNTER — Ambulatory Visit (HOSPITAL_COMMUNITY)
Admission: RE | Admit: 2020-03-21 | Discharge: 2020-03-21 | Disposition: A | Discharge status: Home / Self Care | Payer: Medicare PPO | Source: Ambulatory Visit | Attending: Cardiology | Admitting: Cardiology

## 2020-03-21 ENCOUNTER — Encounter (HOSPITAL_COMMUNITY): Payer: Self-pay | Admitting: Cardiology

## 2020-03-21 ENCOUNTER — Other Ambulatory Visit: Payer: Self-pay

## 2020-03-21 VITALS — BP 96/52 | HR 69 | Wt 211.4 lb

## 2020-03-21 DIAGNOSIS — Z91040 Latex allergy status: Secondary | ICD-10-CM | POA: Insufficient documentation

## 2020-03-21 DIAGNOSIS — D509 Iron deficiency anemia, unspecified: Secondary | ICD-10-CM | POA: Insufficient documentation

## 2020-03-21 DIAGNOSIS — Z8041 Family history of malignant neoplasm of ovary: Secondary | ICD-10-CM | POA: Insufficient documentation

## 2020-03-21 DIAGNOSIS — Z8249 Family history of ischemic heart disease and other diseases of the circulatory system: Secondary | ICD-10-CM | POA: Diagnosis not present

## 2020-03-21 DIAGNOSIS — I872 Venous insufficiency (chronic) (peripheral): Secondary | ICD-10-CM | POA: Insufficient documentation

## 2020-03-21 DIAGNOSIS — Z888 Allergy status to other drugs, medicaments and biological substances status: Secondary | ICD-10-CM | POA: Diagnosis not present

## 2020-03-21 DIAGNOSIS — Z79899 Other long term (current) drug therapy: Secondary | ICD-10-CM | POA: Diagnosis not present

## 2020-03-21 DIAGNOSIS — J9611 Chronic respiratory failure with hypoxia: Secondary | ICD-10-CM | POA: Diagnosis not present

## 2020-03-21 DIAGNOSIS — Z803 Family history of malignant neoplasm of breast: Secondary | ICD-10-CM | POA: Diagnosis not present

## 2020-03-21 DIAGNOSIS — Z9981 Dependence on supplemental oxygen: Secondary | ICD-10-CM | POA: Insufficient documentation

## 2020-03-21 DIAGNOSIS — Z825 Family history of asthma and other chronic lower respiratory diseases: Secondary | ICD-10-CM | POA: Insufficient documentation

## 2020-03-21 DIAGNOSIS — Z87891 Personal history of nicotine dependence: Secondary | ICD-10-CM | POA: Insufficient documentation

## 2020-03-21 DIAGNOSIS — Z833 Family history of diabetes mellitus: Secondary | ICD-10-CM | POA: Insufficient documentation

## 2020-03-21 DIAGNOSIS — Z8349 Family history of other endocrine, nutritional and metabolic diseases: Secondary | ICD-10-CM | POA: Diagnosis not present

## 2020-03-21 DIAGNOSIS — I5032 Chronic diastolic (congestive) heart failure: Secondary | ICD-10-CM | POA: Diagnosis not present

## 2020-03-21 DIAGNOSIS — I2721 Secondary pulmonary arterial hypertension: Secondary | ICD-10-CM | POA: Diagnosis not present

## 2020-03-21 DIAGNOSIS — E785 Hyperlipidemia, unspecified: Secondary | ICD-10-CM | POA: Diagnosis not present

## 2020-03-21 DIAGNOSIS — J849 Interstitial pulmonary disease, unspecified: Secondary | ICD-10-CM | POA: Insufficient documentation

## 2020-03-21 DIAGNOSIS — Z7982 Long term (current) use of aspirin: Secondary | ICD-10-CM | POA: Diagnosis not present

## 2020-03-21 LAB — BASIC METABOLIC PANEL
Anion gap: 9 (ref 5–15)
BUN: 17 mg/dL (ref 8–23)
CO2: 29 mmol/L (ref 22–32)
Calcium: 8.6 mg/dL — ABNORMAL LOW (ref 8.9–10.3)
Chloride: 100 mmol/L (ref 98–111)
Creatinine, Ser: 0.96 mg/dL (ref 0.44–1.00)
GFR calc Af Amer: >60 mL/min (ref >60)
GFR calc non Af Amer: >60 mL/min (ref >60)
Glucose, Bld: 96 mg/dL (ref 70–99)
Potassium: 4.1 mmol/L (ref 3.5–5.1)
Sodium: 138 mmol/L (ref 135–145)

## 2020-03-21 LAB — BRAIN NATRIURETIC PEPTIDE: B Natriuretic Peptide: 35.9 pg/mL (ref 0.0–100.0)

## 2020-03-21 MED ORDER — TORSEMIDE 20 MG PO TABS: 40.0000 mg | ORAL_TABLET | Freq: Two times a day (BID) | ORAL | 3 refills | Status: DC

## 2020-03-21 NOTE — Progress Notes (Signed)
Date:  03/21/2020   ID:  [name], DOB [birth year], MRN [id number]  Provider location: [facility] Type of Visit: Established patient   PCP:  No primary care provider on file.  Cardiologist:  Dr. [name]  Chief Complaint: Shortness of breath.   History of Present Illness: Kelly Donaldson is a 64 y.o. female who has a history of chronic hypoxemic respiratory failure and pulmonary hypertension.  She has been followed by Dr Melvyn Novas, now follows with [name] with pulmonology.  Has been on home oxygen for several years now.  She quit smoking in 2016.  She had an echo done in 5/16 showing dilated RV with severe pulmonary hypertension.  She therefore had RHC in 5/16 which confirmed severe pulmonary hypertension and RV failure.  PFTs showed only mild obstruction and restriction with markedly decreased DLCO suggestive of pulmonary vascular disease.  There was concern for sarcoidosis on CT chest but lymph node biopsy showed no evidence for sarcoidosis.  V/Q scan showed no evidence for chronic PE.   She has not tolerated Revatio or Adcirca.  Have failed previous up-titration of Selexipag with pelvic pain, can tolerate 200 mcg bid.  She is now tolerating riociguat 2 mg tid.   Started torsemide in Oct. 2017, has had better diuresis.   Due to ongoing dyspnea, she had repeat RHC in 5/19 that showed normal filling pressures and well-controlled pulmonary hypertension.  Echo in 6/19 showed normal LV EF, normal RV systolic function, and mild RV dilation.   She now is on Anoro, feels like this has helped her breathing.    Echo in 9/20 showed EF 55-60%, mild RV dilation with normal systolic function, PASP 26 mmHg, IVC normal.   She returns for followup of pulmonary HTN.  She has been doing well symptomatically.  Her weight is up but she attributes it to dietary indiscretion.  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  Her breathing is stable, no  dyspnea unless she exerts herself heavily.  No chest pain.  No lightheadedness.  She does not want to do a 6 minute walk today as she is wearing flip flops.   6 minute walk (5/16): 238 meters => decreased oxygen saturation to 73%, increased to 6 L [place] 6 minute walk (7/16): 170 meters => unable to complete.  6 minute walk (9/16): 201 meters 6 minute walk (1/17): 256 meters 6 minute walk (4/17): 219 meters 6 minute walk (8/17): 225 meters 6 minute walk (11/17): 244 meters 6 minute walk (5/18): 229 meters 6 minute walk (2/19): 122 meters (but she was stopped halfway through with drop in oxygen saturation).  6 minute walk (12/20): 304 meters  Labs (2/16): BNP 69 Labs (5/16): LFTs normal, HCT 34.9, RF negative, HIV negative, TSH negative Labs (6/16): K 4.4, creatinine 0.88 Labs (7/16): ANA negative, BNP 58, K 4.3, creatinine 1.1, anti-RNP negative Labs (9/16): K 4.4, creatinine 1.26, BNP 91 Labs (10/16): K 4.3, creatinine 1.04 Labs (12/16): K 4.2, creatinine 1.07, BNP 130 Labs (1/17): K 4.3, creatinine 1.23, BNP 42 Labs (3/17): K 4.2, creatinine 1.19 Labs (5/17): K 4.3, creatinine 1.35, BNP 44 Labs (6/17): K 4.4, creatinine 1.18, BNP 44 Labs (10/17): K 4.2, creatinine 0.98, BNP 43 Labs (12/17): K 4.5, creatinine 1.23. BNP 64.5 Labs (2/18): K 4.5, creatinine 1.21, BNP 64 Labs (4/18): K 4.1, creatinine 1.24, BNP 26 Labs (9/18): K 4.6, creatinine 1.25 Labs (2/19): K 4.2, creatinine  1.39 Labs (5/19): K 4, creatinine 1.4, hgb 8.4 Labs (12/19): K 4.3, creatinine 1.15 Labs (6/20): TSH normal, K 4, creatinine 1.04, LDL 78 Labs (12/20): LDL 99, K 4.3, creatinine 1.0 . PMH: 1. Pulmonary hypertension: RHC (5/16) with mean RA 18, PA 83/33 mean 53, mean PCWP 19, CI 2.61, PVR 5.8 WU.   Echo (5/16) with EF 65-70%, septal flattening, dilated RV with PA systolic pressure 99 mmHg, +bubble study.  PFTs (4/16) with FVC 76%, FEV1 75%, ratio 97%, TLC 66%, DLCO 26% => mild restriction, mild obstruction,  severe diffuse abnormality (pulmonary vascular problem).  CTA chest (4/16) with chronic interstitial and obstructive lung disease, small mediastinal and hilar lymph noes, no PE.  Lymph node biopsy negative for sarcoidosis (reactive changes).  V/Q scan (6/16): No acute or chronic PE. TEE (6/16): Normal LV size and systolic function, EF 54-27%,CW was mildly dilated with mildly decreased systolic function, D-shaped interventricular septum suggestive of RV pressure/volume overload, there appeared to be a small PFO present with some bubbles crossing, no ASD.  ANA negative, anti-RNP negative, HIV negative. Sleep study (8/16) without OSA.  She did not tolerate Adcirca or Revatio.  She cannot tolerate more than 200 mcg bid Selexipag.  - Echo (6/17): EF 23-76%, normal diastolic function, D-shaped interventricular septum, mildly dilated RV with normal RV systolic function, PASP 39, IVC normal.  - Echo (6/18): EF 28-31%, normal diastolic function, D-shaped interventricular septum, mildly dilated RV with normal RV systolic function, PASP 65 mmHg.  - RHC (7/18): RA 13, PA 51/23 mean 36, PCWP 19, CI 4.74, PVR 1.6 WU - RHC (5/19): mean RA 8, PA 48/20 mean 30, mean PCWP 13, CI 5.06, PVR 1.58 WU.  - Echo (6/19): EF 60-65%, mild RV dilation with normal RV systolic function, PASP 60, possible PFO.  - Echo (9/20): EF 55-60%, mild RV dilation with normal systolic function, PASP 26 mmHg, IVC normal.  2. Chronic venous insufficiency. 3. Hyperlipidemia.  4. GERD 5. Chronic hypoxemic respiratory failure: Hypoxemia, on home oxygen.  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  - PFTs (7/19): FEV1 82%, FVC 80%, ratio 101%, TLC 73%, DLCO 23%.  6. Fe deficiency anemia: EGD/colonoscopy at [facility] normal in 2020.   Current Outpatient Medications  Medication Sig Dispense Refill  . Accu-Chek Softclix Lancets lancets Check glucose BID Dx R73.03 200 each 3  . ADEMPAS 2 MG TABS Take 2 mg by mouth 3 (three)  times daily. Dose changes may occur throughout the course of therapy as directed by your prescriber. 270 tablet 3  . albuterol (VENTOLIN HFA) 108 (90 Base) MCG/ACT inhaler Inhale 2 puffs into the lungs every 4 (four) hours as needed for wheezing or shortness of breath.    . Alcohol Swabs (B-D SINGLE USE SWABS REGULAR) PADS Check glucose BID Dx R73.03 200 each 3  . aspirin EC 81 MG tablet Take 1 tablet (81 mg total) by mouth daily. 90 tablet 3  . Blood Glucose Calibration (ACCU-CHEK AVIVA) SOLN 1 Units by In Vitro route every 30 (thirty) days. 3 each 0  . Blood Glucose Monitoring Suppl (ACCU-CHEK AVIVA PLUS) w/Device KIT Check glucose BID Dx R73.03 1 kit 0  . ferrous sulfate (FERROUSUL) 325 (65 FE) MG tablet Take 1 tablet (325 mg total) by mouth daily with breakfast. 90 tablet 3  . fluticasone (FLONASE) 50 MCG/ACT nasal spray Place 1 spray into both nostrils daily. 48 g 3  . glucose blood (ACCU-CHEK AVIVA PLUS) test strip Check glucose BID  Dx R73.03 100 each 12  . HYDROcodone-acetaminophen (NORCO) 10-325 MG tablet     . ipratropium-albuterol (DUONEB) 0.5-2.5 (3) MG/3ML SOLN INHALE THE CONTENTS OF 1 VIAL VIA NEBULIZER FOUR TIMES DAILY AS NEEDED 1080 mL 3  . macitentan (OPSUMIT) 10 MG tablet Take 1 tablet (10 mg total) by mouth daily. 90 tablet 3  . OXYGEN Take 2.5-4 L by mouth See admin instructions. 2.5 L with resting state & 4 L with exertion [facility]    . Polyethyl Glycol-Propyl Glycol (LUBRICANT EYE DROPS) 0.4-0.3 % SOLN Place 1 drop into both eyes daily.    . potassium chloride SA (KLOR-CON) 20 MEQ tablet TAKE 1 TABLET TWICE DAILY 180 tablet 3  . pravastatin (PRAVACHOL) 20 MG tablet Take 1 tablet (20 mg total) by mouth daily. 90 tablet 3  . Selexipag 200 MCG TABS Take 1 tablet (200 mcg total) by mouth 2 (two) times daily. 180 tablet 3  . Tiotropium Bromide-Olodaterol (STIOLTO RESPIMAT) 2.5-2.5 MCG/ACT AERS Inhale 2 puffs into the lungs daily. 12 g 3  . torsemide (DEMADEX) 20 MG tablet Take 2  tablets (40 mg total) by mouth 2 (two) times daily. 360 tablet 3  . Vitamin D, Ergocalciferol, (DRISDOL) 1.25 MG (50000 UT) CAPS capsule TAKE 1 CAPSULE EVERY MONDAY 12 capsule 3  . zolpidem (AMBIEN) 5 MG tablet Take 1 tablet (5 mg total) by mouth at bedtime. 30 tablet 5   No current facility-administered medications for this encounter.    Allergies:   Gabapentin and Latex   Social History:  The patient  reports that she quit smoking about 5 years ago. Her smoking use included cigarettes. She has a 4.25 pack-year smoking history. She has never used smokeless tobacco. She reports current drug use. Drug: Cocaine. She reports that she does not drink alcohol.   Family History:  The patient's family history includes Asthma in her sister; Breast cancer in her maternal grandmother; Clotting disorder in her daughter; Diabetes in her mother; Emphysema in her father; Hyperlipidemia in her father and mother; Hypertension in her daughter, father, and mother; Other in her mother; Ovarian cancer in her maternal aunt.   ROS:  Please see the history of present illness.   All other systems are personally reviewed and negative.   Exam:   BP (!) 96/52   Pulse 69   Wt 95.9 kg (211 lb 6.4 oz)   SpO2 90% Comment: 2L of oxygen  BMI 33.11 kg/m  General: NAD Neck: No JVD, no thyromegaly or thyroid nodule.  Lungs: Clear to auscultation bilaterally with normal respiratory effort. CV: Nondisplaced PMI.  Heart regular S1/S2, no S3/S4, 2/6 early SEM RUSB.  Trace ankle edema.  No carotid bruit.  Normal pedal pulses.  Abdomen: Soft, nontender, no hepatosplenomegaly, no distention.  Skin: Intact without lesions or rashes.  Neurologic: Alert and oriented x 3.  Psych: Normal affect. Extremities: No clubbing or cyanosis.  HEENT: Normal.   Recent Labs: 11/11/2019: ALT 12; Hemoglobin 12.2; Platelets 168; TSH 0.867 03/21/2020: B Natriuretic Peptide 35.9; BUN 17; Creatinine, Ser 0.96; Potassium 4.1; Sodium 138  Personally  reviewed   Wt Readings from Last 3 Encounters:  03/21/20 95.9 kg (211 lb 6.4 oz)  02/21/20 95.2 kg (209 lb 12.8 oz)  02/11/20 92.4 kg (203 lb 12.8 oz)      ASSESSMENT AND PLAN:  1. Pulmonary arterial hypertension with RV failure: Severe on 5/16 RHC. 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  There was concern from CTA chest for sarcoidosis, but lymph node biopsy was negative.  LFTs normal, ANA negative, anti-RNP negative, HIV negative.  V/Q scan negative for chronic PE.  Small PFO but no ASD on TEE. No OSA on sleep study.  She did not tolerate Revatio due to joint pain (now resolved), and she did not tolerate Adcirca with worsening dyspnea. She does not tolerate more than 200 mcg bid Selexipag due to pelvic pain.  Most recent echo in 6/19 showed normal LV EF, normal RV systolic function with mild RV dilation, and PASP 60.  RHCs in 7/18 and 5/19 have shown well-compensated pulmonary hypertension.  PVR was 1.58 WU on 5/19 study. Echo in 9/20 showed mildly dilated RV with normal systolic function, estimated PASP only 26 mmHg.  She is improved symptomatically. I think that a lot of her residual dyspnea is due to COPD + ILD rather than pulmonary hypertension.  She will need 6 minute walk at next appt (wearing flip flops today and wants to defer).  - Continue oxygen with exertion.  - Open lung biopsy deemed too risky, probably not sarcoid per pulmonary evaluation.    - Continue opsumit and riociguat 2 mg twice a day (unable to go higher due to dizziness).   - She has tolerated selexipag 200 mcg bid but has not been able to increase.  2. Chronic hypoxemic respiratory failure: Chronic interstitial lung disease + emphysema.  Concern for sarcoidosis but biopsy did not show sarcoid.  Deemed too high risk for open lung biopsy.  Dr. 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 - Continue  pulmonary followup.  3. Chronic diastolic CHF: Echo 0/21 with EF 55-60%, RV mildly dilated, normal function, PA peak pressure 26 mm Hg. NYHA II. She is not volume overloaded on exam. - Continue torsemide 40 mg bid. BMET/BNP today.    4. Hyperlipidemia: Lipids ok in 9/20.   followup in 4 months  Signed, Loralie Champagne, MD  03/21/2020  [facility] [address] [facility] and [place] [address] [phone] (office) [phone] (fax)

## 2020-03-21 NOTE — Patient Instructions (Signed)
No medication changes today!  Labs today We will only contact you if something comes back abnormal or we need to make some changes. Otherwise no news is good news!  Your physician recommends that you schedule a follow-up appointment in: 4 months with Dr Shirlee Latch  Please call office at [phone] option 2 if you have any questions or concerns.   At [facility], you and your health needs are our priority. As part of our continuing mission to provide you with exceptional heart care, we have created designated Provider Care Teams. These Care Teams include your primary Cardiologist (physician) and Advanced Practice Providers (APPs- Physician Assistants and Nurse Practitioners) who all work together to provide you with the care you need, when you need it.   You may see any of the following providers on your designated Care Team at your next follow up: Marland Kitchen Dr Arvilla Meres . Dr [name] . Tonye Becket, NP  . Robbie Lis, PA . Karle Plumber, PharmD   Please be sure to bring in all your medications bottles to every appointment.

## 2020-03-23 ENCOUNTER — Other Ambulatory Visit: Payer: Self-pay | Admitting: *Deleted

## 2020-03-27 ENCOUNTER — Encounter: Payer: Self-pay | Admitting: *Deleted

## 2020-05-09 ENCOUNTER — Telehealth (HOSPITAL_COMMUNITY): Payer: Self-pay

## 2020-05-09 NOTE — Telephone Encounter (Signed)
Received email from [facility] regarding patients [employer] coverage. Pts assistance have exhausted and they wanted to discuss options for coverage however they have been unable to reach patient and/or leave a message. I called patients husband and he says it was ok for them to call him.  He gave the ok for me to give them his phone number.  Number provided and they will call him.

## 2020-05-11 ENCOUNTER — Telehealth: Payer: Self-pay | Admitting: *Deleted

## 2020-05-11 DIAGNOSIS — R4586 Emotional lability: Secondary | ICD-10-CM

## 2020-05-11 NOTE — Telephone Encounter (Signed)
Fax from [facility] in [place], [place] Request for Aripiprazole 5 mg 1QD Not on current med list. Last OV 02/11/20 next OV 05/15/20

## 2020-05-12 ENCOUNTER — Other Ambulatory Visit: Payer: Self-pay | Admitting: Family Medicine

## 2020-05-12 ENCOUNTER — Other Ambulatory Visit: Payer: Self-pay | Admitting: *Deleted

## 2020-05-12 DIAGNOSIS — R4586 Emotional lability: Secondary | ICD-10-CM

## 2020-05-12 MED ORDER — ARIPIPRAZOLE 5 MG PO TABS: 5.0000 mg | ORAL_TABLET | Freq: Every day | ORAL | 0 refills | Status: DC

## 2020-05-12 NOTE — Telephone Encounter (Signed)
Patient aware, script is ready. 

## 2020-05-12 NOTE — Telephone Encounter (Signed)
Ok to approve #[id number] no refill.  Lawanna Kobus was rx'ing in March.

## 2020-05-15 ENCOUNTER — Ambulatory Visit: Payer: Medicare PPO | Admitting: Family Medicine

## 2020-05-15 ENCOUNTER — Ambulatory Visit: Payer: Medicare PPO | Admitting: Physician Assistant

## 2020-05-17 ENCOUNTER — Other Ambulatory Visit: Payer: Self-pay | Admitting: Family Medicine

## 2020-05-17 NOTE — Telephone Encounter (Signed)
  Prescription Request  05/17/2020  What is the name of the medication or equipment? Sleep Rx  Have you contacted your pharmacy to request a refill? (if applicable) Yes  Which pharmacy would you like this sent to? [facility], [place]  Pt is almost out of her sleeping Rx and is requesting that enough of the Rx be sent to the pharmacy to last her until she comes in for her 3 mth appt on 06/20/20 to see Dr Nadine Counts.   Patient notified that their request is being sent to the clinical staff for review and that they should receive a response within 2 business days.

## 2020-05-17 NOTE — Telephone Encounter (Signed)
Pt must legally be seen for rx.  I've never prescribed.

## 2020-05-22 NOTE — Telephone Encounter (Signed)
Patient has upcoming apt and she is aware.

## 2020-06-08 ENCOUNTER — Ambulatory Visit (INDEPENDENT_AMBULATORY_CARE_PROVIDER_SITE_OTHER): Payer: Medicare PPO | Admitting: *Deleted

## 2020-06-08 DIAGNOSIS — Z0000 Encounter for general adult medical examination without abnormal findings: Secondary | ICD-10-CM | POA: Diagnosis not present

## 2020-06-08 NOTE — Progress Notes (Addendum)
MEDICARE ANNUAL WELLNESS VISIT  06/08/2020  Telephone Visit Disclaimer This Medicare AWV was conducted by telephone due to national recommendations for restrictions regarding the COVID-19 Pandemic (e.g. social distancing).  I verified, using two identifiers, that I am speaking with Kelly Donaldson or their authorized healthcare agent. I discussed the limitations, risks, security, and privacy concerns of performing an evaluation and management service by telephone and the potential availability of an in-person appointment in the future. The patient expressed understanding and agreed to proceed.   Subjective:  Kelly Donaldson is a 64 y.o. female patient of Kelly Norlander, DO who had a Medicare Annual Wellness Visit today via telephone. Kelly Donaldson is disabled and lives with her husband Kelly Donaldson. She has 3 children. She reports that she is socially active and does interact with friends/family regularly. She is not physically active and enjoys gardening and spending time with her grandchildren.   Patient Care Team: Kelly Norlander, DO as PCP - General (Family Medicine)  Advanced Directives 06/08/2020 06/07/2019 05/06/2018 06/13/2017 04/17/2017 02/07/2017 07/23/2015  Does Patient Have a Medical Advance Directive? Yes No No No Yes No No  Type of Advance Directive Living will - - - Living will - -  Does patient want to make changes to medical advance directive? No - Patient declined - - - No - Patient declined - -  Copy of [place] in [place]  Would patient like information on creating a medical advance directive? - Yes (MAU/Ambulatory/Procedural Areas - Information given) No - Patient declined No - Patient declined - - No - patient declined information    Hospital Utilization Over the Past 12 Months: # of hospitalizations or ER visits: 1 # of surgeries: 0  Review of Systems    Patient reports that her overall health is better compared to last year.  History obtained  from the patient and patient chart.   Patient Reported Readings (BP, Pulse, CBG, Weight, etc) none  Pain Assessment Pain : 0-10 Pain Score: 8  Pain Type: Chronic pain Pain Location: Knee Pain Orientation: Right, Left Pain Descriptors / Indicators: Aching Pain Onset: More than a month ago Pain Frequency: Constant Pain Relieving Factors: hydrocodone Effect of Pain on Daily Activities: "cannot do as much unless i am taking my pain medication"  Pain Relieving Factors: hydrocodone  Current Medications & Allergies (verified) Allergies as of 06/08/2020       Reactions   Gabapentin Other (See Comments)   Pt states that she can not take with antidepressants   Latex Rash        Medication List        Accurate as of June 08, 2020 10:18 AM. If you have any questions, ask your nurse or doctor.          Accu-Chek Aviva Plus test strip Generic drug: glucose blood Check glucose BID Dx R73.03   Accu-Chek Aviva Plus w/Device Kit Check glucose BID Dx R73.03   Accu-Chek Aviva Soln 1 Units by In Vitro route every 30 (thirty) days.   Accu-Chek Softclix Lancets lancets Check glucose BID Dx R73.03   Adempas 2 MG Tabs Generic drug: Riociguat Take 2 mg by mouth 3 (three) times daily. Dose changes may occur throughout the course of therapy as directed by your prescriber.   albuterol 108 (90 Base) MCG/ACT inhaler Commonly known as: VENTOLIN HFA Inhale 2 puffs into the lungs every 4 (four) hours as needed for wheezing or  shortness of breath.   ARIPiprazole 5 MG tablet Commonly known as: Abilify Take 1 tablet (5 mg total) by mouth daily.   aspirin EC 81 MG tablet Take 1 tablet (81 mg total) by mouth daily.   azaTHIOprine 50 MG tablet Commonly known as: IMURAN   B-D SINGLE USE SWABS REGULAR Pads Check glucose BID Dx R73.03   ferrous sulfate 325 (65 FE) MG tablet Commonly known as: FerrouSul Take 1 tablet (325 mg total) by mouth daily with breakfast.   fluticasone 50  MCG/ACT nasal spray Commonly known as: FLONASE Place 1 spray into both nostrils daily.   HYDROcodone-acetaminophen 10-325 MG tablet Commonly known as: NORCO   ipratropium-albuterol 0.5-2.5 (3) MG/3ML Soln Commonly known as: DUONEB INHALE THE CONTENTS OF 1 VIAL VIA NEBULIZER FOUR TIMES DAILY AS NEEDED   Lubricant Eye Drops 0.4-0.3 % Soln Generic drug: Polyethyl Glycol-Propyl Glycol Place 1 drop into both eyes daily.   Opsumit 10 MG tablet Generic drug: macitentan Take 1 tablet (10 mg total) by mouth daily.   OXYGEN Take 2.5-4 L by mouth See admin instructions. 2.5 L with resting state & 4 L with exertion [facility]   potassium chloride SA 20 MEQ tablet Commonly known as: KLOR-CON TAKE 1 TABLET TWICE DAILY   pravastatin 20 MG tablet Commonly known as: PRAVACHOL Take 1 tablet (20 mg total) by mouth daily.   Selexipag 200 MCG Tabs Take 1 tablet (200 mcg total) by mouth 2 (two) times daily.   Stiolto Respimat 2.5-2.5 MCG/ACT Aers Generic drug: Tiotropium Bromide-Olodaterol Inhale 2 puffs into the lungs daily.   torsemide 20 MG tablet Commonly known as: DEMADEX Take 2 tablets (40 mg total) by mouth 2 (two) times daily.   Vitamin D (Ergocalciferol) 1.25 MG (50000 UNIT) Caps capsule Commonly known as: DRISDOL TAKE 1 CAPSULE EVERY MONDAY   zolpidem 5 MG tablet Commonly known as: AMBIEN Take 1 tablet (5 mg total) by mouth at bedtime.        History (reviewed): Past Medical History:  Diagnosis Date   Allergy    Depression    GERD (gastroesophageal reflux disease)    Hyperlipidemia    Oxygen dependent    Shortness of breath dyspnea    Varicose veins    Past Surgical History:  Procedure Laterality Date   CARDIAC CATHETERIZATION N/A 04/20/2015   Procedure: Right Heart Cath;  Surgeon: Larey Dresser, MD;  Location: [place] [facility];  Service: Cardiovascular;  Laterality: N/A;   RIGHT HEART CATH N/A 06/13/2017   Procedure: Right Heart Cath;  Surgeon: Larey Dresser, MD;  Location: [place] [facility];  Service: Cardiovascular;  Laterality: N/A;   RIGHT HEART CATH N/A 05/06/2018   Procedure: RIGHT HEART CATH;  Surgeon: Larey Dresser, MD;  Location: [place] [facility];  Service: Cardiovascular;  Laterality: N/A;   TEE WITHOUT CARDIOVERSION N/A 05/11/2015   Procedure: TRANSESOPHAGEAL ECHOCARDIOGRAM (TEE);  Surgeon: Larey Dresser, MD;  Location: [facility];  Service: Cardiovascular;  Laterality: N/A;   TUBAL LIGATION     11/84   Family History  Problem Relation Age of Onset   Hyperlipidemia Mother    Hypertension Mother    Diabetes Mother    Other Mother        varicose veins   Hyperlipidemia Father    Hypertension Father    Emphysema Father        smoked   Hypertension Daughter    Asthma Sister    Clotting disorder Daughter  Breast cancer Maternal Grandmother    Ovarian cancer Maternal Aunt    Social History   Socioeconomic History   Marital status: Married    Spouse name: Kelly Donaldson   Number of children: 3   Years of education: Not on file   Highest education level: 12th grade  Occupational History   Occupation: Disabled  Tobacco Use   Smoking status: Former Smoker    Packs/day: 0.25    Years: 17.00    Pack years: 4.25    Types: Cigarettes    Quit date: 03/02/2015    Years since quitting: 5.2   Smokeless tobacco: Never Used  Vaping Use   Vaping Use: Never used  Substance and Sexual Activity   Alcohol use: No    Alcohol/week: 0.0 standard drinks   Drug use: Yes    Types: Cocaine    Comment: 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 3 children. Enjoys gardening and spending time with grandchildren.    Social Determinants of Health   Financial Resource Strain:    Difficulty of Paying Living Expenses:   Food Insecurity:    Worried About Charity fundraiser in the Last Year:    Academic librarian in the Last Year:   Transportation Needs:    Film/video editor (Medical):    Lack of Transportation (Non-Medical):   Physical Activity:    Days of Exercise per Week:    Minutes of Exercise per Session:   Stress:    Feeling of Stress :   Social Connections:    Frequency of Communication with Friends and Family:    Frequency of Social Gatherings with Friends and Family:    Attends Religious Services:    Active Member of Clubs or Organizations:    Attends Archivist Meetings:    Marital Status:     Activities of Daily Living In your present state of health, do you have any difficulty performing the following activities: 06/08/2020  Hearing? N  Vision? N  Difficulty concentrating or making decisions? N  Walking or climbing stairs? Y  Comment climbing stairs, knee pain  Dressing or bathing? N  Doing errands, shopping? Y  Comment daughter or grandchild goes with her  Preparing Food and eating ? N  Using the Toilet? N  In the past six months, have you accidently leaked urine? Y  Comment wears pad  Do you have problems with loss of bowel control? N  Managing your Medications? N  Managing your Finances? N  Housekeeping or managing your Housekeeping? N  Some recent data might be hidden    Patient Education/ Literacy How often do you need to have someone help you when you read instructions, pamphlets, or other written materials from your doctor or pharmacy?: 1 - Never What is the last grade level you completed in school?: 12  Exercise Current Exercise Habits: Home exercise routine, Type of exercise: stretching, Time (Minutes): 30, Frequency (Times/Week): 4, Weekly Exercise (Minutes/Week): 120, Intensity: Mild, Exercise limited by: orthopedic condition(s) (knee pain)  Diet Patient reports consuming 3 meals a day and 2 snack(s) a day Patient reports that her primary diet is: Low Sodium Patient reports that she does have regular access to food.   Depression  Screen PHQ 2/9 Scores 06/08/2020 02/11/2020 11/11/2019 08/17/2019 06/07/2019 02/12/2019 11/13/2018  PHQ - 2 Score 0 4 0  0 0 2 2  PHQ- 9 Score - 16 - - - 5 10     Fall Risk Fall Risk  06/08/2020 02/11/2020 11/11/2019 08/17/2019 06/07/2019  Falls in the past year? '1 1 1 1 '$ 0  Number falls in past yr: 0 0 0 0 0  Injury with Fall? 1 0 0 0 0  Risk for fall due to : History of fall(s) History of fall(s) - Impaired balance/gait;Impaired mobility -  Risk for fall due to: Comment - - - - -  Follow up Falls evaluation completed Falls prevention discussed - - -     Objective:  Kelly Donaldson seemed alert and oriented and she participated appropriately during our telephone visit.  Blood Pressure Weight BMI  BP Readings from Last 3 Encounters:  03/21/20 (!) 96/52  02/21/20 (!) 90/54  02/11/20 98/64   Wt Readings from Last 3 Encounters:  03/21/20 211 lb 6.4 oz (95.9 kg)  02/21/20 209 lb 12.8 oz (95.2 kg)  02/11/20 203 lb 12.8 oz (92.4 kg)   BMI Readings from Last 1 Encounters:  03/21/20 33.11 kg/m    *Unable to obtain current vital signs, weight, and BMI due to telephone visit type  Hearing/Vision  Sherina did not seem to have difficulty with hearing/understanding during the telephone conversation Reports that she has not had a formal eye exam by an eye care professional within the past year Reports that she has not had a formal hearing evaluation within the past year *Unable to fully assess hearing and vision during telephone visit type  Cognitive Function: 6CIT Screen 06/08/2020 06/07/2019  What Year? 0 points 0 points  What month? 0 points 0 points  What time? 0 points 0 points  Count back from 20 0 points 0 points  Months in reverse 4 points 0 points  Repeat phrase 0 points 0 points  Total Score 4 0   (Normal:0-7, Significant for Dysfunction: >8)  Normal Cognitive Function Screening: Yes   Immunization & Health Maintenance Record Immunization History  Administered Date(s) Administered    Influenza Split 09/08/2014, 09/20/2015, 09/08/2017   Influenza,inj,Quad PF,6+ Mos 09/04/2016, 11/12/2018, 08/17/2019   Influenza-Unspecified 10/13/2017   Moderna SARS-COVID-2 Vaccination 02/21/2020, 03/20/2020   Pneumococcal Polysaccharide-23 11/11/2019    Health Maintenance  Topic Date Due   MAMMOGRAM  08/28/2017   PAP SMEAR-Modifier  08/27/2018   TETANUS/TDAP  11/10/2020 (Originally 03/12/1975)   Hepatitis C Screening  11/10/2020 (Originally [birth year])   INFLUENZA VACCINE  07/09/2020   COLONOSCOPY  06/20/2026   COVID-19 Vaccine  Completed   HIV Screening  Completed       Assessment  This is a routine wellness examination for Kelly Donaldson.  Health Maintenance: Due or Overdue Health Maintenance Due  Topic Date Due   MAMMOGRAM  08/28/2017   PAP SMEAR-Modifier  08/27/2018    Kelly Donaldson does not need a referral for Community Assistance: Care Management:   no Social Work:    no Prescription Assistance:  no Nutrition/Diabetes Education:  no   Plan:  Personalized Goals Goals Addressed             This Visit's Progress    Patient Stated       06/08/2020 AWV Goal: Keep All Scheduled Appointments  Over the next year, patient will attend all scheduled appointments with their PCP and any specialists that they see.        Personalized Health Maintenance & Screening Recommendations  Screening mammography , Pap smear  Lung Cancer Screening  Recommended: no (Low Dose CT Chest recommended if Age 76-80 years, 30 pack-year currently smoking OR have quit w/in past 15 years) Hepatitis C Screening recommended: no HIV Screening recommended: no  Advanced Directives: Written information was not prepared per patient's request.  Referrals & Orders No orders of the defined types were placed in this encounter.   Follow-up Plan Follow-up with Kelly Norlander, DO as planned Schedule mammogram and pap smear    I have personally reviewed and noted the following in the  patient's chart:   Medical and social history Use of alcohol, tobacco or illicit drugs  Current medications and supplements Functional ability and status Nutritional status Physical activity Advanced directives List of other physicians Hospitalizations, surgeries, and ER visits in previous 12 months Vitals Screenings to include cognitive, depression, and falls Referrals and appointments  In addition, I have reviewed and discussed with Kelly Donaldson certain preventive protocols, quality metrics, and best practice recommendations. A written personalized care plan for preventive services as well as general preventive health recommendations is available and can be mailed to the patient at her request.      Baldomero Lamy, LPN 06/13/5464   I have reviewed and agree with the above AWV documentation.   Evelina Dun, FNP

## 2020-06-14 ENCOUNTER — Other Ambulatory Visit: Payer: Self-pay | Admitting: Family Medicine

## 2020-06-14 DIAGNOSIS — R4586 Emotional lability: Secondary | ICD-10-CM

## 2020-06-15 NOTE — Progress Notes (Signed)
Provider: [facility] Patient: HOME 

## 2020-06-16 ENCOUNTER — Other Ambulatory Visit: Payer: Self-pay | Admitting: *Deleted

## 2020-06-20 ENCOUNTER — Ambulatory Visit (INDEPENDENT_AMBULATORY_CARE_PROVIDER_SITE_OTHER): Payer: Medicare PPO | Admitting: Family Medicine

## 2020-06-20 ENCOUNTER — Other Ambulatory Visit: Payer: Self-pay

## 2020-06-20 VITALS — BP 88/59 | HR 68 | Temp 97.6°F | Ht 67.0 in | Wt 210.0 lb

## 2020-06-20 DIAGNOSIS — Z79899 Other long term (current) drug therapy: Secondary | ICD-10-CM

## 2020-06-20 DIAGNOSIS — I5032 Chronic diastolic (congestive) heart failure: Secondary | ICD-10-CM | POA: Diagnosis not present

## 2020-06-20 DIAGNOSIS — G894 Chronic pain syndrome: Secondary | ICD-10-CM

## 2020-06-20 DIAGNOSIS — Z7689 Persons encountering health services in other specified circumstances: Secondary | ICD-10-CM

## 2020-06-20 DIAGNOSIS — F5104 Psychophysiologic insomnia: Secondary | ICD-10-CM | POA: Diagnosis not present

## 2020-06-20 DIAGNOSIS — F1190 Opioid use, unspecified, uncomplicated: Secondary | ICD-10-CM

## 2020-06-20 DIAGNOSIS — F331 Major depressive disorder, recurrent, moderate: Secondary | ICD-10-CM | POA: Diagnosis not present

## 2020-06-20 DIAGNOSIS — M5136 Other intervertebral disc degeneration, lumbar region: Secondary | ICD-10-CM

## 2020-06-20 MED ORDER — ZOLPIDEM TARTRATE 5 MG PO TABS: 5.0000 mg | ORAL_TABLET | Freq: Every evening | ORAL | 3 refills | Status: DC | PRN

## 2020-06-20 NOTE — Progress Notes (Signed)
Subjective: CC: Establish care, insomnia, sarcoidosis of the lung, CHF PCP: Kelly Norlander, DO [id number]:[id number] Kelly Donaldson is a 64 y.o. female presenting to clinic today for:  1.  Insomnia/lung disease/CHF Patient reports longstanding history of insomnia.  She has been on Ambien 5 mg nightly for quite some time now.  She reports regular scheduled use of this medication.  Denies any visual hallucinations, auditory hallucinations, falls.  She is on chronic supplemental oxygen 24/7, typically 2 Kelly at rest and 3 Kelly with activity.  She is closely followed by both her cardiologist and pulmonologist every 3 months.  She reports compliance with her inhalers and prescribed medications.  2.  Chronic pain Patient with chronic pain, primarily in bilateral knees and low back.  She is unable take oral NSAIDs secondary to advanced CHF.  She is prescribed Norco 10-3 25 and sees a pain management clinic monthly for this.  She is wondering if we can take over her pain management here.  She would be willing to try and go down to 3 times daily dosing.  She keeps a bowel regimen to avoid constipation.   ROS: Per HPI  Allergies  Allergen Reactions  . Gabapentin Other (See Comments)    Pt states that she can not take with antidepressants  . Latex Rash   Past Medical History:  Diagnosis Date  . Allergy   . Depression   . GERD (gastroesophageal reflux disease)   . Hyperlipidemia   . Oxygen dependent   . Shortness of breath dyspnea   . Varicose veins     Current Outpatient Medications:  .  Accu-Chek Softclix Lancets lancets, Check glucose BID Dx R73.03, Disp: 200 each, Rfl: 3 .  ADEMPAS 2 MG TABS, Take 2 mg by mouth 3 (three) times daily. Dose changes may occur throughout the course of therapy as directed by your prescriber., Disp: 270 tablet, Rfl: 3 .  albuterol (VENTOLIN HFA) 108 (90 Base) MCG/ACT inhaler, Inhale 2 puffs into the lungs every 4 (four) hours as needed for wheezing or shortness of breath.,  Disp: , Rfl:  .  Alcohol Swabs (B-D SINGLE USE SWABS REGULAR) PADS, Check glucose BID Dx R73.03, Disp: 200 each, Rfl: 3 .  ARIPiprazole (ABILIFY) 5 MG tablet, TAKE 1 TABLET(5 MG) BY MOUTH DAILY, Disp: 90 tablet, Rfl: 0 .  aspirin EC 81 MG tablet, Take 1 tablet (81 mg total) by mouth daily., Disp: 90 tablet, Rfl: 3 .  azaTHIOprine (IMURAN) 50 MG tablet, , Disp: , Rfl:  .  Blood Glucose Calibration (ACCU-CHEK AVIVA) SOLN, 1 Units by In Vitro route every 30 (thirty) days., Disp: 3 each, Rfl: 0 .  Blood Glucose Monitoring Suppl (ACCU-CHEK AVIVA PLUS) w/Device KIT, Check glucose BID Dx R73.03, Disp: 1 kit, Rfl: 0 .  ferrous sulfate (FERROUSUL) 325 (65 FE) MG tablet, Take 1 tablet (325 mg total) by mouth daily with breakfast., Disp: 90 tablet, Rfl: 3 .  fluticasone (FLONASE) 50 MCG/ACT nasal spray, Place 1 spray into both nostrils daily., Disp: 48 g, Rfl: 3 .  glucose blood (ACCU-CHEK AVIVA PLUS) test strip, Check glucose BID Dx R73.03, Disp: 100 each, Rfl: 12 .  HYDROcodone-acetaminophen (NORCO) 10-325 MG tablet, , Disp: , Rfl:  .  ipratropium-albuterol (DUONEB) 0.5-2.5 (3) MG/3ML SOLN, INHALE THE CONTENTS OF 1 VIAL VIA NEBULIZER FOUR TIMES DAILY AS NEEDED, Disp: 1080 mL, Rfl: 3 .  macitentan (OPSUMIT) 10 MG tablet, Take 1 tablet (10 mg total) by mouth daily., Disp: 90 tablet, Rfl: 3 .  OXYGEN, Take 2.5-4 Kelly by mouth See admin instructions. 2.5 Kelly with resting state & 4 Kelly with exertion [facility], Disp: , Rfl:  .  Polyethyl Glycol-Propyl Glycol (LUBRICANT EYE DROPS) 0.4-0.3 % SOLN, Place 1 drop into both eyes daily., Disp: , Rfl:  .  potassium chloride SA (KLOR-CON) 20 MEQ tablet, TAKE 1 TABLET TWICE DAILY, Disp: 180 tablet, Rfl: 3 .  pravastatin (PRAVACHOL) 20 MG tablet, Take 1 tablet (20 mg total) by mouth daily., Disp: 90 tablet, Rfl: 3 .  Selexipag 200 MCG TABS, Take 1 tablet (200 mcg total) by mouth 2 (two) times daily., Disp: 180 tablet, Rfl: 3 .  Tiotropium Bromide-Olodaterol (STIOLTO RESPIMAT)  2.5-2.5 MCG/ACT AERS, Inhale 2 puffs into the lungs daily., Disp: 12 g, Rfl: 3 .  torsemide (DEMADEX) 20 MG tablet, Take 2 tablets (40 mg total) by mouth 2 (two) times daily., Disp: 360 tablet, Rfl: 3 .  Vitamin D, Ergocalciferol, (DRISDOL) 1.25 MG (50000 UT) CAPS capsule, TAKE 1 CAPSULE EVERY MONDAY, Disp: 12 capsule, Rfl: 3 .  zolpidem (AMBIEN) 5 MG tablet, Take 1 tablet (5 mg total) by mouth at bedtime., Disp: 30 tablet, Rfl: 5 Social History   Socioeconomic History  . Marital status: Married    Spouse name: Kelly Donaldson  . Number of children: 3  . Years of education: Not on file  . Highest education level: 12th grade  Occupational History  . Occupation: Disabled  Tobacco Use  . Smoking status: Former Smoker    Packs/day: 0.25    Years: 17.00    Pack years: 4.25    Types: Cigarettes    Quit date: 03/02/2015    Years since quitting: 5.3  . Smokeless tobacco: Never Used  Vaping Use  . Vaping Use: Never used  Substance and Sexual Activity  . Alcohol use: No    Alcohol/week: 0.0 standard drinks  . 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 Sexual activity: Not Currently  Other Topics Concern  . Not on file  Social History Narrative   Disabled, lives with husband Kelly Donaldson. 3 children. Enjoys gardening and spending time with grandchildren.    Social Determinants of Health   Financial Resource Strain:   . Difficulty of Paying Living Expenses:   Food Insecurity:   . Worried About Charity fundraiser in the Last Year:   . Arboriculturist in the Last Year:   Transportation Needs:   . Film/video editor (Medical):   Kelly Donaldson Kitchen Lack of Transportation (Non-Medical):   Physical Activity:   . Days of Exercise per Week:   . Minutes of Exercise per Session:   Stress:   . Feeling of Stress :   Social Connections:   . Frequency of Communication with Friends and Family:   . Frequency of Social Gatherings with Friends and Family:   . Attends Religious  Services:   . Active Member of Clubs or Organizations:   . Attends Archivist Meetings:   Kelly Donaldson Kitchen Marital Status:   Intimate Partner Violence:   . Fear of Current or Ex-Partner:   . Emotionally Abused:   Kelly Donaldson Kitchen Physically Abused:   . Sexually Abused:    Family History  Problem Relation Age of Onset  . Hyperlipidemia Mother   . Hypertension Mother   . Diabetes Mother   . Other Mother        varicose veins  . Hyperlipidemia Father   . Hypertension Father   .  Emphysema Father        smoked  . Hypertension Daughter   . Asthma Sister   . Clotting disorder Daughter   . Breast cancer Maternal Grandmother   . Ovarian cancer Maternal Aunt     Objective: Office vital signs reviewed. BP (!) 88/59   Pulse 68   Temp 97.6 F (36.4 C) (Temporal)   Ht '5\' 7"'$  (1.702 m)   Wt 210 lb (95.3 kg)   SpO2 93%   BMI 32.89 kg/m   Physical Examination:  General: Awake, alert, chronically ill appearing, No acute distress HEENT: Normal, sclera white Cardio: regular rate and rhythm, S1S2 heard, no murmurs appreciated Pulm: decreased breath sounds in apices bilaterally, no wheezes, rhonchi or rales; normal work of breathing on 3L O2 Extremities: no edema. MSK: slow antalgic gait and station; stiff with getting up from seated position. Ambulating independently Psych: Mood stable, speech normal, affect appropriate.  Good eye contact.  Does not appear to be responding to internal stimuli.  She does appear to be slightly anxious  Depression screen [facility] 2/9 06/20/2020 06/08/2020 02/11/2020  Decreased Interest 0 0 2  Down, Depressed, Hopeless 1 0 2  PHQ - 2 Score 1 0 4  Altered sleeping 2 - 2  Tired, decreased energy 2 - 2  Change in appetite 1 - 2  Feeling bad or failure about yourself  2 - 1  Trouble concentrating 1 - 1  Moving slowly or fidgety/restless 0 - 2  Suicidal thoughts 2 - 2  PHQ-9 Score 11 - 16  Difficult doing work/chores Somewhat difficult - Very difficult  Some recent data might be  hidden   GAD 7 : Generalized Anxiety Score 06/20/2020 02/11/2020 11/11/2019  Nervous, Anxious, on Edge 2 2 0  Control/stop worrying 2 3 0  Worry too much - different things 2 3 0  Trouble relaxing 2 1 0  Restless 2 1 0  Easily annoyed or irritable '1 3 1  '$ Afraid - awful might happen '2 3 1  '$ Total GAD 7 Score '13 16 2  '$ Anxiety Difficulty Somewhat difficult Somewhat difficult Not difficult at all    Assessment/ Plan: 63 y.o. female   1. Psychophysiological insomnia Stable.  National narcotic database was reviewed and there were no red flags.  Refills of Ambien sent - ToxASSURE Select 13 (MW), Urine - zolpidem (AMBIEN) 5 MG tablet; Take 1 tablet (5 mg total) by mouth at bedtime as needed for sleep.  Dispense: 30 tablet; Refill: 3  2. Controlled substance agreement signed UDS and CSC obtained per office policy - ToxASSURE Select 13 (MW), Urine  3. Moderate episode of recurrent major depressive disorder (HCC) Not well controlled.  She had mentioned passively to my nurse some recent suicidal thoughts that have "gotten better".  I have placed an urgent referral to virtual behavioral health for counseling services.  She has good support by her spouse, who is a Theme park manager.  This was not discussed in detail but I am going to have a close follow-up with her in about 4 weeks.  4. Chronic diastolic CHF (congestive heart failure) ([place]) Continue to follow-up with cardiology  5. Establishing care with new doctor, encounter for  6. DDD (degenerative disc disease), lumbar Currently being managed by pain management.  I would be willing to take over her pain medication if we can get her down to 3 times daily dosing of the Norco.  However, if she ever required more she would have to go back to pain  management  7. Chronic pain syndrome  8. Chronic, continuous use of opioids   No orders of the defined types were placed in this encounter.  No orders of the defined types were placed in this  encounter.    Kelly Norlander, DO [place] [phone]

## 2020-06-20 NOTE — Patient Instructions (Signed)
Let me know about the pain regimen.  If you can get down to 3 tablets of Hydrocodone daily, I can take over.   Controlled Substance Guidelines:  1. You cannot get an early refill, even it is lost.  2. You cannot get controlled medications from any other doctor, unless it is the emergency department and related to a new problem or injury.  3. You cannot use alcohol, marijuana, cocaine or any other recreational drugs while using this medication. This is very dangerous.  4. You are willing to have your urine drug tested at each visit.  5. You will not drive while using this medication, because that can put yourself and others in serious danger of an accident. 6. If any medication is stolen, then there must be a police report to verify it, or it cannot be refilled.  7. I will not prescribe these medications for longer than 3 months.  8. You must bring your pill bottle to each visit.  9. You must use the same pharmacy for all refills for the medication, unless you clear it with me beforehand.  10. You cannot share or sell this medication.

## 2020-06-22 LAB — TOXASSURE SELECT 13 (MW), URINE

## 2020-06-26 ENCOUNTER — Telehealth: Payer: Self-pay | Admitting: Licensed Clinical Social Worker

## 2020-06-26 ENCOUNTER — Encounter: Payer: Self-pay | Admitting: Licensed Clinical Social Worker

## 2020-06-26 NOTE — Telephone Encounter (Signed)
Left message encouraging contact 

## 2020-06-28 ENCOUNTER — Telehealth: Payer: Self-pay | Admitting: Licensed Clinical Social Worker

## 2020-06-28 NOTE — Telephone Encounter (Signed)
Unable to leave message

## 2020-06-30 ENCOUNTER — Ambulatory Visit: Payer: Medicare PPO

## 2020-06-30 ENCOUNTER — Ambulatory Visit: Payer: Medicare PPO | Admitting: Internal Medicine

## 2020-06-30 ENCOUNTER — Ambulatory Visit: Payer: Medicare PPO | Admitting: Primary Care

## 2020-07-11 ENCOUNTER — Ambulatory Visit (INDEPENDENT_AMBULATORY_CARE_PROVIDER_SITE_OTHER): Payer: Medicare PPO | Admitting: Internal Medicine

## 2020-07-11 ENCOUNTER — Other Ambulatory Visit: Payer: Self-pay

## 2020-07-11 ENCOUNTER — Encounter: Payer: Self-pay | Admitting: Internal Medicine

## 2020-07-11 ENCOUNTER — Ambulatory Visit (INDEPENDENT_AMBULATORY_CARE_PROVIDER_SITE_OTHER): Payer: Medicare PPO

## 2020-07-11 DIAGNOSIS — J9611 Chronic respiratory failure with hypoxia: Secondary | ICD-10-CM | POA: Diagnosis not present

## 2020-07-11 DIAGNOSIS — J849 Interstitial pulmonary disease, unspecified: Secondary | ICD-10-CM | POA: Diagnosis not present

## 2020-07-11 DIAGNOSIS — I2721 Secondary pulmonary arterial hypertension: Secondary | ICD-10-CM

## 2020-07-11 DIAGNOSIS — J449 Chronic obstructive pulmonary disease, unspecified: Secondary | ICD-10-CM

## 2020-07-11 NOTE — Assessment & Plan Note (Signed)
Quit smoking 2016  - PFT's  06/18/18  FEV1 1.90 (82 % ) ratio 0.80  p no % improvement from saba p ? prior to study with DLCO  6.5 (23%) corrects to 2.23 (43%)  for alv volume and FV curve mild/mod curvature in effort indep portion of f/v > started on anoro and helped doe  CT chest 07/07/18 mild centrilobular and paraseptal emphysema. - 02/21/2020  After extensive coaching inhaler device,  effectiveness =    90% with smi > changed to stiolto per insurance req   Pt is Group B in terms of symptom/risk and laba/lama therefore appropriate rx at this point >>>  Continue stiolto

## 2020-07-11 NOTE — Assessment & Plan Note (Signed)
[name] - pulmonary eval Kelly Donaldson last seen 12/17/12 with "neg CTa for PE, pfts ok except dlco 20%, echo c/w  PH, 02 dep hypoxemia > rec RHC and stop phenterimine" -Echo 09/15/14     LAE mildly dilated, as are RV and RA  - 01/11/2015   Walked 2lpm  x one lap @ 185 stopped due to  Sob, nl pace, no desat - Quit smoking 03/02/15  - PFTs 03/20/2015  Nl except for DLCO 26 corrects to 35%  - 03/20/2015   Walked 2lpm x one lap @ 185 stopped due to sob/ desat corrected on 4lpm  - 04/07/2015 rec  referral to Dr Jearld Pies for Right heart Cath - Repeat Echo with bubble study 04/19/2015 > With injection of agitated saline intravenously there is appearance of bubbles in L sided chambers 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 - 05/09/2015   Walked 4lpm x one lap @ 185 stopped due to  88% one lap > 05/11/15 Pos small PFO - 07/11/2020 desat during 6 mw on 3lpm cont   As of 07/11/2020  = 2lpm hs and rest/ up to 5lpm POC  Advised:  Make sure you check your oxygen saturations at highest level of activity to be sure it stays over 90% and adjust upward to maintain this level if needed but remember to turn it back to previous settings when you stop (to conserve your supply).

## 2020-07-11 NOTE — Progress Notes (Signed)
Six Minute Walk - 07/11/20 1048      Six Minute Walk   Medications taken before test (dose and time) aspirin 81mg , hydrocodone-acetaminophen 10-325mg , opsumit 10mg , potassium chloride 20mg , adempas 2mg , selexipag , torsemide 20mg , taken at 7:30am    Supplemental oxygen during test? Yes    O2 Flow Rate (L/min) 3 L/min    Type Continuous    Lap distance in meters  34 meters    Laps Completed 14    Partial lap (in meters) 0 meters    Baseline BP (sitting) 112/70    Baseline Heartrate 68    Baseline Dyspnea (Borg Scale) 0    Baseline Fatigue (Borg Scale) 0    Baseline SPO2 96 %      End of Test Values    BP (sitting) 120/80    Heartrate 85    Dyspnea (Borg Scale) 2    Fatigue (Borg Scale) 2    SPO2 84 %   on 3L continuous     2 Minutes Post Walk Values   BP (sitting) 114/76    Heartrate 72    SPO2 97 %    Stopped or paused before six minutes? Yes    Reason: Pt stopped with 50 sec remaining due to just "being done." Pt also stated that she felt like her knee was popping out of joint.    Other Symptoms at end of exercise: Leg pain   not a new symptom     Interpretation   Distance completed 476 meters    Tech Comments: Pt walked at a normal pace, denied any complaints during walk. stopped with 50 seconds remaining due to being done with the walk.

## 2020-07-11 NOTE — Patient Instructions (Signed)
Make sure you check your oxygen saturations at highest level of activity to be sure it stays over 90% and adjust upward to maintain this level if needed but remember to turn it back to previous settings when you stop (to conserve your supply).   Please schedule a follow up visit in 6  months but call sooner if needed  - Dr Judeth Horn will be your new pulmonary doctor as he took over Pulmonary Hypertension patients from Dr [name]

## 2020-07-11 NOTE — Assessment & Plan Note (Addendum)
08/2016 high-resolution CT scan of the chest: [facility] review: Peripheral based interlobular septal thickening and groundglass consistent with fibrosis with some mild bronchiectasis, fibrotic changes appear to be more prominent in the lower lobes with upper lobe nodules, no air trapping noted on expiratory images, mediastinal lymphadenopathy stable per radiology  Chest HRCT 07/07/18 . CT pattern is considered indeterminate for usual interstitial pneumonia (UIP). 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          Each maintenance medication was reviewed in detail including emphasizing most importantly the difference between maintenance and prns and under what circumstances the prns are to be triggered using an action plan format where appropriate.  Total time for H and P, chart review, counseling, reviewing device and generating customized AVS unique to this office visit / charting = 30 min

## 2020-07-11 NOTE — Assessment & Plan Note (Signed)
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 See chronic resp failure  >>> F/u will be per Dr Lorayne Marek and cards

## 2020-07-11 NOTE — Progress Notes (Signed)
Subjective:    Patient ID: Kelly Donaldson, female    DOB: Mar 30, 1956,   MRN: [id number]    Brief patient profile:  11 yobf quit smoking 2016  on noct 02 2lpm x 2013 and daytime doe x more than [employer] off 02 but losing ground gradually since onset  assoc with leg pain/ swelling = bilaterally already eval [place] pulmonary doctor = Koleen Nimrod rec wear 02 no dx so referred to pulmonary clinic 01/11/15 by Octavio Graves   History of Present Illness  01/11/2015 1st [place] [facility] office visit/ Brenn Deziel   Chief Complaint  Patient presents with  . Pulmonary Consult    Referred by Dr. Melina Copa. Pt c/o DOE x 2 yrs worse x 3 months. She states DOE seems worse in the evenings. She gets out of breath walking up stairs or "doing too much". She also c/o cough- prod with clear sputum and has trouble swallowing often.   chronic doe x years much worse x 3 months assoc with dry daytime cough and mild dysphagia/ also worse leg swelling just using 02 at hs  Prev w/u in [place] "neg" per pt, only rx= 02 rec  We will request  the last discharge summary from [facility] to leave 02 off sitting   but wear 2lpm at bedtime and with walking always use = 2lpm  Or goal of over 90% if you have a monitor  The key is to stop smoking completely before smoking completely stops you!  Wear the elastic stockings as much as possible Try prilosec '20mg'$   Take 30-60 min before first meal of the day and Pepcid 20 mg one bedtime until  Return (over the counter)  GERD diet   W/u   - pulmonary eval [place] last seen 12/17/12 with "neg CTa for PE, pfts ok except dlco 20%, echo c/w  PH, 02 dep hypoxemia > rec RHC and stop phenterimine" -Echo 09/15/14     LAE mildly dilated, as are RV and RA  - 01/11/2015   Walked 2lpm  x one lap @ 185 stopped due to  Sob, nl pace, no desat - Quit smoking 03/02/15  - PFTs 03/20/2015  Nl except for DLCO 26 corrects to 35%  - 03/20/2015   Walked 2lpm x one lap @ 185 stopped due to sob/ desat  corrected on 4lpm  - 04/07/2015 rec  referral to Dr Marigene Ehlers for Right heart Cath - Repeat Echo with bubble study 04/19/2015 > With injection of agitated saline intravenously there is appearance of bubbles in L sided chambers 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 > 05/11/15 Pos small PFO  rec as of 03/20/2015 2lpm 24/7 except 6lpm walking outside of house         05/09/2015 f/u ov/Hammad Finkler re:  2lpm / up 6lpm pulsed with ex / off cigs x 02/2015  Chief Complaint  Patient presents with  . Follow-up    Pt c/o SOB with activity, PND, sinus congestion. Pt denies any chest congestion, cough.   nose is better off all rx but still drippy  rec Use 2lpm at rest is ok,  4lpm walking around the house but need 6lpm walking outside any distance and with exercise.   06/12/16 Final [employer] OV: Follow [facility] instructions re stopping medications you feel are making you sick We will set you up with Dr Lake Bells, our pulmonary hypertension specialist, for a third opinion on options  Goal is to keep the 02 level above 90% at all times  [facility] final ov 02/11/2019 Centrilobular emphysema: Start taking Anoro 1 puff daily no matter how you feel Keep using albuterol as needed for chest tightness wheezing or shortness of breath Practice good hand hygiene Stay active  Chronic respiratory failure with hypoxemia: Keep using 4 to 6 L of oxygen continuously as you are doing  Pulmonary hypertension: Continue taking the pulmonary vasodilators as directed by the advanced heart failure clinic Keep sodium intake less than 2 g a day Weigh daily Continue taking torsemide   12/31/2019  Re-establish /ov/Kearah Gayden re:  PAH/ chronic resp failure/ GOLD 0 copd maint on anoro  Chief Complaint  Patient presents with  . Follow-up    Former patient of Dr Lake Bells. She feels like the Anoro works well and that her breathing has improved slightly since the last visit. No new co's.   Dyspnea:  Chases around grand daughter on 2.5 lpm    Cough: none  Sleeping: on side/ slt elevation hob  SABA use: has hfa not needing  / rarely use neb  02:  2.5 lpm hs and at  rec I do recommend the covid vaccine whenever you can get it. Make sure you check your oxygen saturations  Please schedule a follow up visit in 6 months but call sooner if needed  - add 6 min walk on return/ revisit issue of NSIP   02/21/2020  f/u ov/Ashleymarie Granderson re: GOLD 0 copd / insurance wants her changed to [employer] Complaint  Patient presents with  . Follow-up    Having issues with insurance covering the anoro- they do cover stioto.    Dyspnea:  Chases grand kids around on  3lpm  Cough: none  Sleeping: slt hob electric SABA use: rarely using ? proair  02: 2lpm then up to 3 chasing kids  rec Try albuterol (? Proair)  15 min before an activity that you know would make you short of breath   Otherwise only use your albuterol as a rescue medication to be used if you can't catch your breath Stop anoro and start stiolto x 2 puffs each am        07/11/2020  f/u ov/Masoud Nyce re:  PAH  With R to L shunt/ COPD 0/ NSIP  Chief Complaint  Patient presents with  . Follow-up    Breathing is overall doing well and no new co's. She rarely uses her albuterol.   Dyspnea: MMRC2 = can't walk a nl pace on a flat grade s sob but does fine slow and flat on 3 -4 lpm  Cough: none  Sleeping: about 30 degrees hob electric SABA [id number] 02: 2lpm sleeping   And 3-4 lpm during the day POC      No obvious day to day or daytime variability or assoc excess/ purulent sputum or mucus plugs or hemoptysis or cp or chest tightness, subjective wheeze or overt sinus or hb symptoms.   Sleeping  without nocturnal  or early am exacerbation  of respiratory  c/o's or need for noct saba. Also denies any obvious fluctuation of symptoms with weather or environmental changes or other aggravating or alleviating factors except as outlined above   No unusual exposure hx or h/o childhood pna/ asthma or  knowledge of premature birth.  Current Allergies, Complete Past Medical History, Past Surgical History, Family History, and Social History were reviewed in [employer] record.  ROS  The following are not active complaints unless bolded Hoarseness, sore throat, dysphagia, dental problems, itching, sneezing,  nasal congestion  or discharge of excess mucus or purulent secretions, ear ache,   fever, chills, sweats, unintended wt loss or wt gain, classically pleuritic or exertional cp,  orthopnea pnd or arm/hand swelling  or leg swelling, presyncope, palpitations, abdominal pain, anorexia, nausea, vomiting, diarrhea  or change in bowel habits or change in bladder habits, change in stools or change in urine, dysuria, hematuria,  rash, arthralgias, visual complaints, headache, numbness, weakness or ataxia or problems with walking or coordination,  change in mood or  memory.        Current Meds  Medication Sig  . Accu-Chek Softclix Lancets lancets Check glucose BID Dx R73.03  . ADEMPAS 2 MG TABS Take 2 mg by mouth 3 (three) times daily. Dose changes may occur throughout the course of therapy as directed by your prescriber.  Marland Kitchen albuterol (VENTOLIN HFA) 108 (90 Base) MCG/ACT inhaler Inhale 2 puffs into the lungs every 4 (four) hours as needed for wheezing or shortness of breath.  . Alcohol Swabs (B-D SINGLE USE SWABS REGULAR) PADS Check glucose BID Dx R73.03  . ARIPiprazole (ABILIFY) 5 MG tablet TAKE 1 TABLET(5 MG) BY MOUTH DAILY  . aspirin EC 81 MG tablet Take 1 tablet (81 mg total) by mouth daily.  Marland Kitchen azaTHIOprine (IMURAN) 50 MG tablet   . Blood Glucose Calibration (ACCU-CHEK AVIVA) SOLN 1 Units by In Vitro route every 30 (thirty) days.  . Blood Glucose Monitoring Suppl (ACCU-CHEK AVIVA PLUS) w/Device KIT Check glucose BID Dx R73.03  . ferrous sulfate (FERROUSUL) 325 (65 FE) MG tablet Take 1 tablet (325 mg total) by mouth daily with breakfast.  . fluticasone (FLONASE) 50 MCG/ACT nasal  spray Place 1 spray into both nostrils daily.  Marland Kitchen glucose blood (ACCU-CHEK AVIVA PLUS) test strip Check glucose BID Dx R73.03  . HYDROcodone-acetaminophen (NORCO) 10-325 MG tablet   . ipratropium-albuterol (DUONEB) 0.5-2.5 (3) MG/3ML SOLN INHALE THE CONTENTS OF 1 VIAL VIA NEBULIZER FOUR TIMES DAILY AS NEEDED  . macitentan (OPSUMIT) 10 MG tablet Take 1 tablet (10 mg total) by mouth daily.  . OXYGEN Take 2.5-4 L by mouth See admin instructions. 2.5 L with resting state & 4 L with exertion [employer]  . Polyethyl Glycol-Propyl Glycol (LUBRICANT EYE DROPS) 0.4-0.3 % SOLN Place 1 drop into both eyes daily.  . potassium chloride SA (KLOR-CON) 20 MEQ tablet TAKE 1 TABLET TWICE DAILY  . pravastatin (PRAVACHOL) 20 MG tablet Take 1 tablet (20 mg total) by mouth daily.  . Selexipag 200 MCG TABS Take 1 tablet (200 mcg total) by mouth 2 (two) times daily.  . Tiotropium Bromide-Olodaterol (STIOLTO RESPIMAT) 2.5-2.5 MCG/ACT AERS Inhale 2 puffs into the lungs daily.  Marland Kitchen torsemide (DEMADEX) 20 MG tablet Take 2 tablets (40 mg total) by mouth 2 (two) times daily.  . Vitamin D, Ergocalciferol, (DRISDOL) 1.25 MG (50000 UT) CAPS capsule TAKE 1 CAPSULE EVERY MONDAY  . zolpidem (AMBIEN) 5 MG tablet Take 1 tablet (5 mg total) by mouth at bedtime as needed for sleep.           Objective:   Physical Exam    amb bf nad   07/11/2020         203  02/21/2020       209  12/31/2019       201 05/09/15           255    03/20/15 254 lb (115.214 kg)  02/08/15 253 lb 6.4 oz (114.941 kg)  01/11/15 259 lb (117.482 kg)  Vital signs reviewed  07/11/2020  - Note at rest 02 sats  96% on 3lpm cont   HEENT : pt wearing mask not removed for exam due to covid - 19 concerns.   NECK :  without JVD/Nodes/TM/ nl carotid upstrokes bilaterally   LUNGS: no acc muscle use,  Min barrel  contour chest wall with bilateral  slightly decreased bs s audible wheeze and  without cough on insp or exp maneuvers and min  Hyperresonant  to   percussion bilaterally     CV:  RRR  no s3  II/VI SEM with slt increase in  P2, and no edema   ABD:  soft and nontender with pos end  insp Hoover's  in the supine position. No bruits or organomegaly appreciated, bowel sounds nl  MS:   Nl gait/  ext warm without deformities, calf tenderness, cyanosis or clubbing No obvious joint restrictions   SKIN: warm and dry without lesions    NEURO:  alert, approp, nl sensorium with  no motor or cerebellar deficits apparent.                     Assessment & Plan:

## 2020-07-18 ENCOUNTER — Encounter (HOSPITAL_COMMUNITY): Payer: Self-pay | Admitting: Cardiology

## 2020-07-18 ENCOUNTER — Ambulatory Visit (HOSPITAL_COMMUNITY)
Admission: RE | Admit: 2020-07-18 | Discharge: 2020-07-18 | Disposition: A | Discharge status: Home / Self Care | Payer: Medicare PPO | Source: Ambulatory Visit | Attending: Cardiology | Admitting: Cardiology

## 2020-07-18 ENCOUNTER — Other Ambulatory Visit: Payer: Self-pay

## 2020-07-18 ENCOUNTER — Other Ambulatory Visit: Payer: Self-pay | Admitting: *Deleted

## 2020-07-18 VITALS — BP 100/60 | HR 65 | Wt 209.2 lb

## 2020-07-18 DIAGNOSIS — I5032 Chronic diastolic (congestive) heart failure: Secondary | ICD-10-CM | POA: Insufficient documentation

## 2020-07-18 DIAGNOSIS — I2729 Other secondary pulmonary hypertension: Secondary | ICD-10-CM | POA: Diagnosis not present

## 2020-07-18 DIAGNOSIS — J849 Interstitial pulmonary disease, unspecified: Secondary | ICD-10-CM | POA: Insufficient documentation

## 2020-07-18 DIAGNOSIS — D869 Sarcoidosis, unspecified: Secondary | ICD-10-CM

## 2020-07-18 DIAGNOSIS — Z7982 Long term (current) use of aspirin: Secondary | ICD-10-CM | POA: Insufficient documentation

## 2020-07-18 DIAGNOSIS — M069 Rheumatoid arthritis, unspecified: Secondary | ICD-10-CM | POA: Insufficient documentation

## 2020-07-18 DIAGNOSIS — Z79899 Other long term (current) drug therapy: Secondary | ICD-10-CM | POA: Insufficient documentation

## 2020-07-18 DIAGNOSIS — J439 Emphysema, unspecified: Secondary | ICD-10-CM | POA: Diagnosis not present

## 2020-07-18 DIAGNOSIS — I272 Pulmonary hypertension, unspecified: Secondary | ICD-10-CM | POA: Diagnosis not present

## 2020-07-18 DIAGNOSIS — E785 Hyperlipidemia, unspecified: Secondary | ICD-10-CM | POA: Diagnosis not present

## 2020-07-18 DIAGNOSIS — I872 Venous insufficiency (chronic) (peripheral): Secondary | ICD-10-CM | POA: Insufficient documentation

## 2020-07-18 DIAGNOSIS — Z87891 Personal history of nicotine dependence: Secondary | ICD-10-CM | POA: Insufficient documentation

## 2020-07-18 DIAGNOSIS — F5104 Psychophysiologic insomnia: Secondary | ICD-10-CM

## 2020-07-18 DIAGNOSIS — R0602 Shortness of breath: Secondary | ICD-10-CM | POA: Diagnosis present

## 2020-07-18 DIAGNOSIS — J9611 Chronic respiratory failure with hypoxia: Secondary | ICD-10-CM | POA: Diagnosis not present

## 2020-07-18 LAB — BASIC METABOLIC PANEL
Anion gap: 11 (ref 5–15)
BUN: 14 mg/dL (ref 8–23)
CO2: 26 mmol/L (ref 22–32)
Calcium: 8.8 mg/dL — ABNORMAL LOW (ref 8.9–10.3)
Chloride: 101 mmol/L (ref 98–111)
Creatinine, Ser: 0.88 mg/dL (ref 0.44–1.00)
GFR calc Af Amer: >60 mL/min (ref >60)
GFR calc non Af Amer: >60 mL/min (ref >60)
Glucose, Bld: 91 mg/dL (ref 70–99)
Potassium: 4.1 mmol/L (ref 3.5–5.1)
Sodium: 138 mmol/L (ref 135–145)

## 2020-07-18 LAB — BRAIN NATRIURETIC PEPTIDE: B Natriuretic Peptide: 47.9 pg/mL (ref 0.0–100.0)

## 2020-07-18 NOTE — Progress Notes (Signed)
Date:  07/18/2020   ID:  Kelly Donaldson, DOB 1956/02/25, MRN [id number]  Provider location: [facility] Type of Visit: Established patient   PCP:  [name], DO  Cardiologist:  Dr. [name]  Chief Complaint: Shortness of breath.   History of Present Illness: Kelly Donaldson is a 64 y.o. female who has a history of chronic hypoxemic respiratory failure and pulmonary hypertension.  She has been followed by Dr Melvyn Novas, now follows with [name] with pulmonology.  Has been on home oxygen for several years now.  She quit smoking in 2016.  She had an echo done in 5/16 showing dilated RV with severe pulmonary hypertension.  She therefore had RHC in 5/16 which confirmed severe pulmonary hypertension and RV failure.  PFTs showed only mild obstruction and restriction with markedly decreased DLCO suggestive of pulmonary vascular disease.  There was concern for sarcoidosis on CT chest but lymph node biopsy showed no evidence for sarcoidosis.  V/Q scan showed no evidence for chronic PE.   She has not tolerated Revatio or Adcirca.  Have failed previous up-titration of Selexipag with pelvic pain, can tolerate 200 mcg bid.  She is now tolerating riociguat 2 mg tid.   Started torsemide in Oct. 2017, has had better diuresis.   Due to ongoing dyspnea, she had repeat RHC in 5/19 that showed normal filling pressures and well-controlled pulmonary hypertension.  Echo in 6/19 showed normal LV EF, normal RV systolic function, and mild RV dilation.   She now is on Anoro, feels like this has helped her breathing.    Echo in 9/20 showed EF 55-60%, mild RV dilation with normal systolic function, PASP 26 mmHg, IVC normal.   She has been diagnosed with rheumatoid arthritis, which may explain her ILD.   She returns for followup of pulmonary HTN.  Recent 6 minute walk in the pulmonary office showed a significant improvement.  She was hospitalized recently in [place] for diarrhea and was  found to have C difficile, this has resolved.  No dyspnea walking on flat ground as long as she has her oxygen (2 L rest, 3 L walking).  No chest pain.  No lightheadedness.  Weight down 2 lbs.     6 minute walk (5/16): 238 meters => decreased oxygen saturation to 73%, increased to 6 L [place] 6 minute walk (7/16): 170 meters => unable to complete.  6 minute walk (9/16): 201 meters 6 minute walk (1/17): 256 meters 6 minute walk (4/17): 219 meters 6 minute walk (8/17): 225 meters 6 minute walk (11/17): 244 meters 6 minute walk (5/18): 229 meters 6 minute walk (2/19): 122 meters (but she was stopped halfway through with drop in oxygen saturation).  6 minute walk (12/20): 304 meters 6 minute walk (8/21): 476 m  Labs (2/16): BNP 69 Labs (5/16): LFTs normal, HCT 34.9, RF negative, HIV negative, TSH negative Labs (6/16): K 4.4, creatinine 0.88 Labs (7/16): ANA negative, BNP 58, K 4.3, creatinine 1.1, anti-RNP negative Labs (9/16): K 4.4, creatinine 1.26, BNP 91 Labs (10/16): K 4.3, creatinine 1.04 Labs (12/16): K 4.2, creatinine 1.07, BNP 130 Labs (1/17): K 4.3, creatinine 1.23, BNP 42 Labs (3/17): K 4.2, creatinine 1.19 Labs (5/17): K 4.3, creatinine 1.35, BNP 44 Labs (6/17): K 4.4, creatinine 1.18, BNP 44 Labs (10/17): K 4.2, creatinine 0.98, BNP 43 Labs (12/17): K 4.5, creatinine 1.23. BNP 64.5 Labs (2/18): K 4.5, creatinine 1.21, BNP 64 Labs (4/18): K 4.1, creatinine 1.24, BNP  26 Labs (9/18): K 4.6, creatinine 1.25 Labs (2/19): K 4.2, creatinine 1.39 Labs (5/19): K 4, creatinine 1.4, hgb 8.4 Labs (12/19): K 4.3, creatinine 1.15 Labs (6/20): TSH normal, K 4, creatinine 1.04, LDL 78 Labs (12/20): LDL 99, K 4.3, creatinine 1.0 Labs (4/21): BNP 36, K 4.1, creatinine 0.96 . PMH: 1. Pulmonary hypertension: RHC (5/16) with mean RA 18, PA 83/33 mean 53, mean PCWP 19, CI 2.61, PVR 5.8 WU.   Echo (5/16) with EF 65-70%, septal flattening, dilated RV with PA systolic pressure 99 mmHg, +bubble  study.  PFTs (4/16) with FVC 76%, FEV1 75%, ratio 97%, TLC 66%, DLCO 26% => mild restriction, mild obstruction, severe diffuse abnormality (pulmonary vascular problem).  CTA chest (4/16) with chronic interstitial and obstructive lung disease, small mediastinal and hilar lymph noes, no PE.  Lymph node biopsy negative for sarcoidosis (reactive changes).  V/Q scan (6/16): No acute or chronic PE. TEE (6/16): Normal LV size and systolic function, EF 75-17%,GY was mildly dilated with mildly decreased systolic function, D-shaped interventricular septum suggestive of RV pressure/volume overload, there appeared to be a small PFO present with some bubbles crossing, no ASD.  ANA negative, anti-RNP negative, HIV negative. Sleep study (8/16) without OSA.  She did not tolerate Adcirca or Revatio.  She cannot tolerate more than 200 mcg bid Selexipag.  - Echo (6/17): EF 17-49%, normal diastolic function, D-shaped interventricular septum, mildly dilated RV with normal RV systolic function, PASP 39, IVC normal.  - Echo (6/18): EF 44-96%, normal diastolic function, D-shaped interventricular septum, mildly dilated RV with normal RV systolic function, PASP 65 mmHg.  - RHC (7/18): RA 13, PA 51/23 mean 36, PCWP 19, CI 4.74, PVR 1.6 WU - RHC (5/19): mean RA 8, PA 48/20 mean 30, mean PCWP 13, CI 5.06, PVR 1.58 WU.  - Echo (6/19): EF 60-65%, mild RV dilation with normal RV systolic function, PASP 60, possible PFO.  - Echo (9/20): EF 55-60%, mild RV dilation with normal systolic function, PASP 26 mmHg, IVC normal.  2. Chronic venous insufficiency. 3. Hyperlipidemia.  4. GERD 5. Chronic hypoxemic respiratory failure: Hypoxemia, on home oxygen.  Seen by Dr Lake Bells, has emphysema + interstitial lung disease (?UIP => suspect related to RA).  - PFTs (7/19): FEV1 82%, FVC 80%, ratio 101%, TLC 73%, DLCO 23%.  6. Fe deficiency anemia: EGD/colonoscopy at [facility] normal in 2020.  7. Rheumatoid arthritis: Azathioprine.   Current  Outpatient Medications  Medication Sig Dispense Refill   Accu-Chek Softclix Lancets lancets Check glucose BID Dx R73.03 200 each 3   ADEMPAS 2 MG TABS Take 2 mg by mouth 3 (three) times daily. Dose changes may occur throughout the course of therapy as directed by your prescriber. 270 tablet 3   albuterol (VENTOLIN HFA) 108 (90 Base) MCG/ACT inhaler Inhale 2 puffs into the lungs every 4 (four) hours as needed for wheezing or shortness of breath.     Alcohol Swabs (B-D SINGLE USE SWABS REGULAR) PADS Check glucose BID Dx R73.03 200 each 3   ARIPiprazole (ABILIFY) 5 MG tablet TAKE 1 TABLET(5 MG) BY MOUTH DAILY 90 tablet 0   aspirin EC 81 MG tablet Take 1 tablet (81 mg total) by mouth daily. 90 tablet 3   azaTHIOprine (IMURAN) 50 MG tablet      Blood Glucose Calibration (ACCU-CHEK AVIVA) SOLN 1 Units by In Vitro route every 30 (thirty) days. 3 each 0   Blood Glucose Monitoring Suppl (ACCU-CHEK AVIVA PLUS) w/Device KIT Check glucose BID Dx R73.03  1 kit 0   citalopram (CELEXA) 10 MG tablet Take 10 mg by mouth daily.     ferrous sulfate (FERROUSUL) 325 (65 FE) MG tablet Take 1 tablet (325 mg total) by mouth daily with breakfast. 90 tablet 3   fluticasone (FLONASE) 50 MCG/ACT nasal spray Place 1 spray into both nostrils daily. 48 g 3   glucose blood (ACCU-CHEK AVIVA PLUS) test strip Check glucose BID Dx R73.03 100 each 12   HYDROcodone-acetaminophen (NORCO) 10-325 MG tablet      ipratropium-albuterol (DUONEB) 0.5-2.5 (3) MG/3ML SOLN INHALE THE CONTENTS OF 1 VIAL VIA NEBULIZER FOUR TIMES DAILY AS NEEDED 1080 mL 3   macitentan (OPSUMIT) 10 MG tablet Take 1 tablet (10 mg total) by mouth daily. 90 tablet 3   OXYGEN Take 2.5-4 L by mouth See admin instructions. 2.5 L with resting state & 4 L with exertion [facility]     Polyethyl Glycol-Propyl Glycol (LUBRICANT EYE DROPS) 0.4-0.3 % SOLN Place 1 drop into both eyes daily.     potassium chloride SA (KLOR-CON) 20 MEQ tablet TAKE 1 TABLET  TWICE DAILY 180 tablet 3   pravastatin (PRAVACHOL) 20 MG tablet Take 1 tablet (20 mg total) by mouth daily. 90 tablet 3   Selexipag 200 MCG TABS Take 1 tablet (200 mcg total) by mouth 2 (two) times daily. 180 tablet 3   tiotropium (SPIRIVA) 18 MCG inhalation capsule Place 18 mcg into inhaler and inhale daily.     Tiotropium Bromide-Olodaterol (STIOLTO RESPIMAT) 2.5-2.5 MCG/ACT AERS Inhale 2 puffs into the lungs daily. 12 g 3   torsemide (DEMADEX) 20 MG tablet Take 2 tablets (40 mg total) by mouth 2 (two) times daily. 360 tablet 3   Vitamin D, Ergocalciferol, (DRISDOL) 1.25 MG (50000 UT) CAPS capsule TAKE 1 CAPSULE EVERY MONDAY 12 capsule 3   zolpidem (AMBIEN) 5 MG tablet Take 1 tablet (5 mg total) by mouth at bedtime as needed for sleep. 30 tablet 3   No current facility-administered medications for this encounter.    Allergies:   Gabapentin and Latex   Social History:  The patient  reports that she quit smoking about 5 years ago. Her smoking use included cigarettes. She has a 4.25 pack-year smoking history. She has never used smokeless tobacco. She reports current drug use. Drug: Cocaine. She reports that she does not drink alcohol.   Family History:  The patient's family history includes Asthma in her sister; Breast cancer in her maternal grandmother; Clotting disorder in her daughter; Diabetes in her mother; Emphysema in her father; Hyperlipidemia in her father and mother; Hypertension in her daughter, father, and mother; Other in her mother; Ovarian cancer in her maternal aunt.   ROS:  Please see the history of present illness.   All other systems are personally reviewed and negative.   Exam:   BP 100/60    Pulse 65    Wt 94.9 kg (209 lb 3.2 oz)    SpO2 95% Comment: 3L of O2   BMI 32.77 kg/m  General: NAD Neck: JVP 7-8 cm, no thyromegaly or thyroid nodule.  Lungs: Clear to auscultation bilaterally with normal respiratory effort. CV: Nondisplaced PMI.  Heart regular S1/S2, no  S3/S4, 2/6 HSM LLSB.  No peripheral edema.  No carotid bruit.  Normal pedal pulses.  Abdomen: Soft, nontender, no hepatosplenomegaly, no distention.  Skin: Intact without lesions or rashes.  Neurologic: Alert and oriented x 3.  Psych: Normal affect. Extremities: No clubbing or cyanosis.  HEENT: Normal.   Recent  Labs: 11/11/2019: ALT 12; Hemoglobin 12.2; Platelets 168; TSH 0.867 07/18/2020: B Natriuretic Peptide 47.9; BUN 14; Creatinine, Ser 0.88; Potassium 4.1; Sodium 138  Personally reviewed   Wt Readings from Last 3 Encounters:  07/18/20 94.9 kg (209 lb 3.2 oz)  07/11/20 92.4 kg (203 lb 12.8 oz)  06/20/20 95.3 kg (210 lb)      ASSESSMENT AND PLAN:  1. Pulmonary arterial hypertension with RV failure: Severe on 5/16 RHC. 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  There was concern from CTA chest for sarcoidosis, but lymph node biopsy was negative.  ILD is likely related to rheumatoid arthritis. LFTs normal, ANA negative, anti-RNP negative, HIV negative.  V/Q scan negative for chronic PE.  Small PFO but no ASD on TEE. No OSA on sleep study.  She did not tolerate Revatio due to joint pain (now resolved), and she did not tolerate Adcirca with worsening dyspnea. She does not tolerate more than 200 mcg bid Selexipag due to pelvic pain.   RHCs in 7/18 and 5/19 have shown well-compensated pulmonary hypertension.  PVR was 1.58 WU on 5/19 study. Echo in 9/20 showed mildly dilated RV with normal systolic function, estimated PASP only 26 mmHg.  She is improved symptomatically. I think that a lot of her residual dyspnea is due to COPD + ILD rather than pulmonary hypertension.  6 minute walk better with pulmonary earlier this month.  - Continue oxygen.  - Open lung biopsy deemed too risky, probably not sarcoid per pulmonary evaluation => suspect ILD related to RA.    - Continue opsumit and riociguat 2 mg twice a day  (unable to go higher due to dizziness).   - She has tolerated selexipag 200 mcg bid but has not been able to increase.  - Check BNP today.  - Repeat echo in 3 months.  2. Chronic hypoxemic respiratory failure: Chronic interstitial lung disease + emphysema.  Concern for sarcoidosis but biopsy did not show sarcoid.  Deemed too high risk for open lung biopsy.  Dr. 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  She has been diagnosed with RA, this may be the source of her ILD.  - Continue pulmonary followup.  3. Chronic diastolic CHF: Echo 1/44 with EF 55-60%, RV mildly dilated, normal function, PA peak pressure 26 mm Hg. NYHA II. She is not volume overloaded on exam. - Continue torsemide 40 mg bid. BMET today.     4. Hyperlipidemia: Lipids ok in 9/20.  5. Rheumatoid arthritis: On azathioprine, follows with Dr. Domingo Sep.   followup in 3 months with echo  Signed, Loralie Champagne, MD  07/18/2020  [place] [address] [facility] [place] [address] [phone] (office) [phone] (fax)

## 2020-07-18 NOTE — Patient Instructions (Signed)
Labs done today, your results will be available in MyChart, we will contact you for abnormal readings. ° °Your physician recommends that you schedule a follow-up appointment in: 3 months with echocardiogram ° °If you have any questions or concerns before your next appointment please send us a message through mychart or call our office at [phone].   ° °TO LEAVE A MESSAGE FOR THE NURSE SELECT OPTION 2, PLEASE LEAVE A MESSAGE INCLUDING: °• YOUR NAME °• DATE OF BIRTH °• CALL BACK NUMBER °• REASON FOR CALL**this is important as we prioritize the call backs ° °YOU WILL RECEIVE A CALL BACK THE SAME DAY AS LONG AS YOU CALL BEFORE 4:00 PM ° °At the [facility], you and your health needs are our priority. As part of our continuing mission to provide you with exceptional heart care, we have created designated Provider Care Teams. These Care Teams include your primary Cardiologist (physician) and Advanced Practice Providers (APPs- Physician Assistants and Nurse Practitioners) who all work together to provide you with the care you need, when you need it.  ° °You may see any of the following providers on your designated Care Team at your next follow up: °• Dr Daniel Bensimhon °• Dr Dalton McLean °• Amy Clegg, NP °• Brittainy Simmons, PA °• Lauren Kemp, PharmD ° ° °Please be sure to bring in all your medications bottles to every appointment.  ° ° ° °

## 2020-07-21 ENCOUNTER — Ambulatory Visit (INDEPENDENT_AMBULATORY_CARE_PROVIDER_SITE_OTHER): Payer: Medicare PPO | Admitting: Family Medicine

## 2020-07-21 ENCOUNTER — Other Ambulatory Visit: Payer: Self-pay

## 2020-07-21 ENCOUNTER — Encounter: Payer: Self-pay | Admitting: Family Medicine

## 2020-07-21 VITALS — BP 84/55 | HR 77 | Temp 97.9°F | Ht 67.0 in | Wt 208.0 lb

## 2020-07-21 DIAGNOSIS — M5136 Other intervertebral disc degeneration, lumbar region: Secondary | ICD-10-CM | POA: Diagnosis not present

## 2020-07-21 DIAGNOSIS — A0472 Enterocolitis due to Clostridium difficile, not specified as recurrent: Secondary | ICD-10-CM

## 2020-07-21 MED ORDER — DIFICID 200 MG PO TABS: 200.0000 mg | ORAL_TABLET | Freq: Two times a day (BID) | ORAL | 0 refills | Status: AC

## 2020-07-21 MED ORDER — VANCOMYCIN HCL 125 MG PO CAPS: 125.0000 mg | ORAL_CAPSULE | Freq: Four times a day (QID) | ORAL | 0 refills | Status: AC

## 2020-07-21 NOTE — Progress Notes (Signed)
Subjective: CC: chronic pain, DDD PCP: [name], DO ZOX:[id number] Kelly Donaldson is a 64 y.o. female presenting to clinic today for:  1. DDD, chronic pain History: chronic pain, primarily in bilateral knees and low back.  She is unable take oral NSAIDs secondary to advanced CHF.    She is prescribed Norco 10-'325mg'$  (5 tabs per day) and sees a pain management clinic monthly for this.  She has been unable to reduce at this point.  She keeps a bowel regimen to avoid constipation.   2.  C. difficile Patient was recently admitted for C. difficile colitis.  She is completed a 10-day course of oral vancomycin but in the last couple of days she started having foul smelling stools again.  She has had at least 8 diarrheal stools in the last 24 hours.  She started to feel dehydrated again, similar to when she was admitted.   ROS: Per HPI  Allergies  Allergen Reactions  . Gabapentin Other (See Comments)    Pt states that she can not take with antidepressants  . Latex Rash   Past Medical History:  Diagnosis Date  . Allergy   . Depression   . GERD (gastroesophageal reflux disease)   . Hyperlipidemia   . Oxygen dependent   . Shortness of breath dyspnea   . Varicose veins     Current Outpatient Medications:  .  Accu-Chek Softclix Lancets lancets, Check glucose BID Dx R73.03, Disp: 200 each, Rfl: 3 .  ADEMPAS 2 MG TABS, Take 2 mg by mouth 3 (three) times daily. Dose changes may occur throughout the course of therapy as directed by your prescriber., Disp: 270 tablet, Rfl: 3 .  albuterol (VENTOLIN HFA) 108 (90 Base) MCG/ACT inhaler, Inhale 2 puffs into the lungs every 4 (four) hours as needed for wheezing or shortness of breath., Disp: , Rfl:  .  Alcohol Swabs (B-D SINGLE USE SWABS REGULAR) PADS, Check glucose BID Dx R73.03, Disp: 200 each, Rfl: 3 .  ARIPiprazole (ABILIFY) 5 MG tablet, TAKE 1 TABLET(5 MG) BY MOUTH DAILY, Disp: 90 tablet, Rfl: 0 .  aspirin EC 81 MG tablet, Take 1 tablet (81 mg  total) by mouth daily., Disp: 90 tablet, Rfl: 3 .  azaTHIOprine (IMURAN) 50 MG tablet, , Disp: , Rfl:  .  Blood Glucose Calibration (ACCU-CHEK AVIVA) SOLN, 1 Units by In Vitro route every 30 (thirty) days., Disp: 3 each, Rfl: 0 .  Blood Glucose Monitoring Suppl (ACCU-CHEK AVIVA PLUS) w/Device KIT, Check glucose BID Dx R73.03, Disp: 1 kit, Rfl: 0 .  citalopram (CELEXA) 10 MG tablet, Take 10 mg by mouth daily., Disp: , Rfl:  .  ferrous sulfate (FERROUSUL) 325 (65 FE) MG tablet, Take 1 tablet (325 mg total) by mouth daily with breakfast., Disp: 90 tablet, Rfl: 3 .  fluticasone (FLONASE) 50 MCG/ACT nasal spray, Place 1 spray into both nostrils daily., Disp: 48 g, Rfl: 3 .  glucose blood (ACCU-CHEK AVIVA PLUS) test strip, Check glucose BID Dx R73.03, Disp: 100 each, Rfl: 12 .  HYDROcodone-acetaminophen (NORCO) 10-325 MG tablet, , Disp: , Rfl:  .  ipratropium-albuterol (DUONEB) 0.5-2.5 (3) MG/3ML SOLN, INHALE THE CONTENTS OF 1 VIAL VIA NEBULIZER FOUR TIMES DAILY AS NEEDED, Disp: 1080 mL, Rfl: 3 .  macitentan (OPSUMIT) 10 MG tablet, Take 1 tablet (10 mg total) by mouth daily., Disp: 90 tablet, Rfl: 3 .  OXYGEN, Take 2.5-4 Kelly by mouth See admin instructions. 2.5 Kelly with resting state & 4 Kelly with exertion [facility],  Disp: , Rfl:  .  Polyethyl Glycol-Propyl Glycol (LUBRICANT EYE DROPS) 0.4-0.3 % SOLN, Place 1 drop into both eyes daily., Disp: , Rfl:  .  potassium chloride SA (KLOR-CON) 20 MEQ tablet, TAKE 1 TABLET TWICE DAILY, Disp: 180 tablet, Rfl: 3 .  pravastatin (PRAVACHOL) 20 MG tablet, Take 1 tablet (20 mg total) by mouth daily., Disp: 90 tablet, Rfl: 3 .  Selexipag 200 MCG TABS, Take 1 tablet (200 mcg total) by mouth 2 (two) times daily., Disp: 180 tablet, Rfl: 3 .  tiotropium (SPIRIVA) 18 MCG inhalation capsule, Place 18 mcg into inhaler and inhale daily., Disp: , Rfl:  .  Tiotropium Bromide-Olodaterol (STIOLTO RESPIMAT) 2.5-2.5 MCG/ACT AERS, Inhale 2 puffs into the lungs daily., Disp: 12 g, Rfl: 3 .   torsemide (DEMADEX) 20 MG tablet, Take 2 tablets (40 mg total) by mouth 2 (two) times daily., Disp: 360 tablet, Rfl: 3 .  Vitamin D, Ergocalciferol, (DRISDOL) 1.25 MG (50000 UT) CAPS capsule, TAKE 1 CAPSULE EVERY MONDAY, Disp: 12 capsule, Rfl: 3 .  zolpidem (AMBIEN) 5 MG tablet, Take 1 tablet (5 mg total) by mouth at bedtime as needed for sleep., Disp: 30 tablet, Rfl: 3 Social History   Socioeconomic History  . Marital status: Married    Spouse name: Kelly Donaldson  . Number of children: 3  . Years of education: Not on file  . Highest education level: 12th grade  Occupational History  . Occupation: Disabled  Tobacco Use  . Smoking status: Former Smoker    Packs/day: 0.25    Years: 17.00    Pack years: 4.25    Types: Cigarettes    Quit date: 03/02/2015    Years since quitting: 5.3  . Smokeless tobacco: Never Used  Vaping Use  . Vaping Use: Never used  Substance and Sexual Activity  . Alcohol use: No    Alcohol/week: 0.0 standard drinks  . 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 Sexual activity: Not Currently  Other Topics Concern  . Not on file  Social History Narrative   Disabled, lives with husband Kelly Donaldson. 3 children. Enjoys gardening and spending time with grandchildren.    Social Determinants of Health   Financial Resource Strain:   . Difficulty of Paying Living Expenses:   Food Insecurity:   . Worried About Charity fundraiser in the Last Year:   . Arboriculturist in the Last Year:   Transportation Needs:   . Film/video editor (Medical):   Marland Kitchen Lack of Transportation (Non-Medical):   Physical Activity:   . Days of Exercise per Week:   . Minutes of Exercise per Session:   Stress:   . Feeling of Stress :   Social Connections:   . Frequency of Communication with Friends and Family:   . Frequency of Social Gatherings with Friends and Family:   . Attends Religious Services:   . Active Member of Clubs or Organizations:   . Attends  Archivist Meetings:   Marland Kitchen Marital Status:   Intimate Partner Violence:   . Fear of Current or Ex-Partner:   . Emotionally Abused:   Marland Kitchen Physically Abused:   . Sexually Abused:    Family History  Problem Relation Age of Onset  . Hyperlipidemia Mother   . Hypertension Mother   . Diabetes Mother   . Other Mother        varicose veins  . Hyperlipidemia Father   .  Hypertension Father   . Emphysema Father        smoked  . Hypertension Daughter   . Asthma Sister   . Clotting disorder Daughter   . Breast cancer Maternal Grandmother   . Ovarian cancer Maternal Aunt     Objective: Office vital signs reviewed. BP (!) 84/55   Pulse 77   Temp 97.9 F (36.6 C) (Temporal)   Ht '5\' 7"'$  (1.702 m)   Wt 208 lb (94.3 kg)   SpO2 94%   BMI 32.58 kg/m   Physical Examination:  General: Awake, alert, chronically ill appearing, No acute distress HEENT: Normal, sclera white Cardio: regular rate and rhythm, S1S2 heard, no murmurs appreciated Pulm: clear to auscultation bilaterally, no wheezes, rhonchi or rales; normal work of breathing on room air GI: soft, non-tender, non-distended, bowel sounds present x4, no hepatomegaly, no splenomegaly, no masses  Assessment/ Plan: 64 y.o. female   1. C. difficile colitis Recurrent.  Unfortunately have not been able to find Dificid anywhere.  I am going to place her on 3 additional days of oral vancomycin then I would like her to switch to Dificid since she essentially has had recurrence now of the C. difficile.  She is aware of the plan.  She is understanding of reasons to seek emergent medical attention emergency department. - vancomycin (VANCOCIN HCL) 125 MG capsule; Take 1 capsule (125 mg total) by mouth 4 (four) times daily for 3 days.  Dispense: 12 capsule; Refill: 0 - fidaxomicin (DIFICID) 200 MG TABS tablet; Take 1 tablet (200 mg total) by mouth 2 (two) times daily for 10 days. Patient aware needs to be ordered.  Vanc sent in for 3 days to  get her through weekend.  Dispense: 20 tablet; Refill: 0  2. DDD (degenerative disc disease), lumbar She will continue to see pain management.  If she is able to get down to 3 tablets/day, I can take over care   No orders of the defined types were placed in this encounter.  No orders of the defined types were placed in this encounter.    [name], DO [place] [phone]

## 2020-07-25 ENCOUNTER — Other Ambulatory Visit: Payer: Self-pay | Admitting: Family Medicine

## 2020-07-25 NOTE — Telephone Encounter (Signed)
TC back - controlled substances are Rxd to local pharmacy, this was done on 06/20/20

## 2020-08-16 ENCOUNTER — Telehealth (HOSPITAL_COMMUNITY): Payer: Self-pay | Admitting: Pharmacy Technician

## 2020-08-16 NOTE — Telephone Encounter (Signed)
The patient's [employer] grant covering the co-pay of her [name] and Opsumit has been depleted. Started an application for [employer] assistance for both medications. Called and spoke with the patient. She will come sign the applications next Wednesday when she is in town. Will fax at that time.  Will follow up.

## 2020-08-28 NOTE — Telephone Encounter (Signed)
Obtained signatures and faxed applications in.  Will follow up.

## 2020-08-30 NOTE — Telephone Encounter (Signed)
Patient was temporarily approved to receive Opsumit and Uptravi until 10/27/20. [employer] is requiring a Medicare attestation to be signed by the patient to be approved for the remainder of the year. Called and left patient a message.  Will follow up.

## 2020-09-12 NOTE — Telephone Encounter (Signed)
Have attempted to call the patient a few times regarding her attestation form. Left another message today for follow up.

## 2020-09-15 ENCOUNTER — Other Ambulatory Visit: Payer: Self-pay | Admitting: Family Medicine

## 2020-09-15 DIAGNOSIS — R4586 Emotional lability: Secondary | ICD-10-CM

## 2020-09-20 ENCOUNTER — Ambulatory Visit (INDEPENDENT_AMBULATORY_CARE_PROVIDER_SITE_OTHER): Payer: Medicare PPO | Admitting: Family Medicine

## 2020-09-20 ENCOUNTER — Other Ambulatory Visit: Payer: Self-pay

## 2020-09-20 ENCOUNTER — Encounter: Payer: Self-pay | Admitting: Family Medicine

## 2020-09-20 VITALS — BP 100/63 | HR 70 | Temp 97.5°F | Ht 67.0 in | Wt 211.0 lb

## 2020-09-20 DIAGNOSIS — J301 Allergic rhinitis due to pollen: Secondary | ICD-10-CM

## 2020-09-20 DIAGNOSIS — F5104 Psychophysiologic insomnia: Secondary | ICD-10-CM | POA: Diagnosis not present

## 2020-09-20 DIAGNOSIS — R4586 Emotional lability: Secondary | ICD-10-CM | POA: Diagnosis not present

## 2020-09-20 DIAGNOSIS — Z23 Encounter for immunization: Secondary | ICD-10-CM

## 2020-09-20 DIAGNOSIS — N3281 Overactive bladder: Secondary | ICD-10-CM | POA: Diagnosis not present

## 2020-09-20 MED ORDER — ZOLPIDEM TARTRATE 5 MG PO TABS: 5.0000 mg | ORAL_TABLET | Freq: Every evening | ORAL | 3 refills | Status: DC | PRN

## 2020-09-20 MED ORDER — CITALOPRAM HYDROBROMIDE 10 MG PO TABS: 10.0000 mg | ORAL_TABLET | Freq: Every day | ORAL | 3 refills | Status: DC

## 2020-09-20 MED ORDER — MIRABEGRON ER 25 MG PO TB24: 25.0000 mg | ORAL_TABLET | Freq: Every day | ORAL | 12 refills | Status: DC

## 2020-09-20 MED ORDER — ARIPIPRAZOLE 5 MG PO TABS: ORAL_TABLET | ORAL | 1 refills | Status: DC

## 2020-09-20 MED ORDER — LORATADINE 10 MG PO TABS: 10.0000 mg | ORAL_TABLET | Freq: Every day | ORAL | 11 refills | Status: DC

## 2020-09-20 NOTE — Progress Notes (Signed)
Subjective: CC: Follow-up insomnia, sarcoidosis of the lung PCP: [name], DO WGN:[id number] Kelly Donaldson is a 64 y.o. female presenting to clinic today for:  1.  Insomnia /sarcoidosis of the lung Continues to use Ambien 5 mg nightly.  Denies any excessive daytime sedation, visual or auditory hallucinations.  While her breathing is impaired and she does require oxygen this has been stable from previous checkup.  She notes that the [place] works well but it is quite expensive.  2.  Allergic rhinitis Patient reports she has had some postnasal drip.  She is using her Flonase once and if there is anything else she can use.  No fevers, myalgia, hemoptysis.  3.  Overactive bladder Patient reports nocturia 4-5 times per night.  This has been ongoing.  She denies any caffeine use or substantial fluids before bedtime.  This interferes with her sleep quite a bit  ROS: Per HPI  Allergies  Allergen Reactions  . Gabapentin Other (See Comments)    Pt states that she can not take with antidepressants  . Latex Rash   Past Medical History:  Diagnosis Date  . Allergy   . Depression   . GERD (gastroesophageal reflux disease)   . Hyperlipidemia   . Oxygen dependent   . Shortness of breath dyspnea   . Varicose veins     Current Outpatient Medications:  .  Accu-Chek Softclix Lancets lancets, Check glucose BID Dx R73.03, Disp: 200 each, Rfl: 3 .  ADEMPAS 2 MG TABS, Take 2 mg by mouth 3 (three) times daily. Dose changes may occur throughout the course of therapy as directed by your prescriber., Disp: 270 tablet, Rfl: 3 .  albuterol (VENTOLIN HFA) 108 (90 Base) MCG/ACT inhaler, Inhale 2 puffs into the lungs every 4 (four) hours as needed for wheezing or shortness of breath., Disp: , Rfl:  .  Alcohol Swabs (B-D SINGLE USE SWABS REGULAR) PADS, Check glucose BID Dx R73.03, Disp: 200 each, Rfl: 3 .  ARIPiprazole (ABILIFY) 5 MG tablet, TAKE 1 TABLET(5 MG) BY MOUTH DAILY, Disp: 90 tablet, Rfl: 0 .   aspirin EC 81 MG tablet, Take 1 tablet (81 mg total) by mouth daily., Disp: 90 tablet, Rfl: 3 .  azaTHIOprine (IMURAN) 50 MG tablet, , Disp: , Rfl:  .  Blood Glucose Calibration (ACCU-CHEK AVIVA) SOLN, 1 Units by In Vitro route every 30 (thirty) days., Disp: 3 each, Rfl: 0 .  Blood Glucose Monitoring Suppl (ACCU-CHEK AVIVA PLUS) w/Device KIT, Check glucose BID Dx R73.03, Disp: 1 kit, Rfl: 0 .  citalopram (CELEXA) 10 MG tablet, Take 10 mg by mouth daily., Disp: , Rfl:  .  ferrous sulfate (FERROUSUL) 325 (65 FE) MG tablet, Take 1 tablet (325 mg total) by mouth daily with breakfast., Disp: 90 tablet, Rfl: 3 .  fluticasone (FLONASE) 50 MCG/ACT nasal spray, Place 1 spray into both nostrils daily., Disp: 48 g, Rfl: 3 .  glucose blood (ACCU-CHEK AVIVA PLUS) test strip, Check glucose BID Dx R73.03, Disp: 100 each, Rfl: 12 .  HYDROcodone-acetaminophen (NORCO) 10-325 MG tablet, , Disp: , Rfl:  .  ipratropium-albuterol (DUONEB) 0.5-2.5 (3) MG/3ML SOLN, INHALE THE CONTENTS OF 1 VIAL VIA NEBULIZER FOUR TIMES DAILY AS NEEDED, Disp: 1080 mL, Rfl: 3 .  macitentan (OPSUMIT) 10 MG tablet, Take 1 tablet (10 mg total) by mouth daily., Disp: 90 tablet, Rfl: 3 .  OXYGEN, Take 2.5-4 Kelly by mouth See admin instructions. 2.5 Kelly with resting state & 4 Kelly with exertion [facility], Disp: ,  Rfl:  .  Polyethyl Glycol-Propyl Glycol (LUBRICANT EYE DROPS) 0.4-0.3 % SOLN, Place 1 drop into both eyes daily., Disp: , Rfl:  .  potassium chloride SA (KLOR-CON) 20 MEQ tablet, TAKE 1 TABLET TWICE DAILY, Disp: 180 tablet, Rfl: 3 .  pravastatin (PRAVACHOL) 20 MG tablet, Take 1 tablet (20 mg total) by mouth daily., Disp: 90 tablet, Rfl: 3 .  Selexipag 200 MCG TABS, Take 1 tablet (200 mcg total) by mouth 2 (two) times daily., Disp: 180 tablet, Rfl: 3 .  tiotropium (SPIRIVA) 18 MCG inhalation capsule, Place 18 mcg into inhaler and inhale daily., Disp: , Rfl:  .  Tiotropium Bromide-Olodaterol (STIOLTO RESPIMAT) 2.5-2.5 MCG/ACT AERS, Inhale 2 puffs  into the lungs daily., Disp: 12 g, Rfl: 3 .  torsemide (DEMADEX) 20 MG tablet, Take 2 tablets (40 mg total) by mouth 2 (two) times daily., Disp: 360 tablet, Rfl: 3 .  Vitamin D, Ergocalciferol, (DRISDOL) 1.25 MG (50000 UT) CAPS capsule, TAKE 1 CAPSULE EVERY MONDAY, Disp: 12 capsule, Rfl: 3 .  zolpidem (AMBIEN) 5 MG tablet, Take 1 tablet (5 mg total) by mouth at bedtime as needed for sleep., Disp: 30 tablet, Rfl: 3 Social History   Socioeconomic History  . Marital status: Married    Spouse name: [name]  . Number of children: 3  . Years of education: Not on file  . Highest education level: 12th grade  Occupational History  . Occupation: Disabled  Tobacco Use  . Smoking status: Former Smoker    Packs/day: 0.25    Years: 17.00    Pack years: 4.25    Types: Cigarettes    Quit date: 03/02/2015    Years since quitting: 5.5  . Smokeless tobacco: Never Used  Vaping Use  . Vaping Use: Never used  Substance and Sexual Activity  . Alcohol use: No    Alcohol/week: 0.0 standard drinks  . 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 Sexual activity: Not Currently  Other Topics Concern  . Not on file  Social History Narrative   Disabled, lives with husband [name]. 3 children. Enjoys gardening and spending time with grandchildren.    Social Determinants of Health   Financial Resource Strain:   . Difficulty of Paying Living Expenses: Not on file  Food Insecurity:   . Worried About Charity fundraiser in the Last Year: Not on file  . Ran Out of Food in the Last Year: Not on file  Transportation Needs:   . Lack of Transportation (Medical): Not on file  . Lack of Transportation (Non-Medical): Not on file  Physical Activity:   . Days of Exercise per Week: Not on file  . Minutes of Exercise per Session: Not on file  Stress:   . Feeling of Stress : Not on file  Social Connections:   . Frequency of Communication with Friends and Family: Not on file  .  Frequency of Social Gatherings with Friends and Family: Not on file  . Attends Religious Services: Not on file  . Active Member of Clubs or Organizations: Not on file  . Attends Archivist Meetings: Not on file  . Marital Status: Not on file  Intimate Partner Violence:   . Fear of Current or Ex-Partner: Not on file  . Emotionally Abused: Not on file  . Physically Abused: Not on file  . Sexually Abused: Not on file   Family History  Problem Relation Age of  Onset  . Hyperlipidemia Mother   . Hypertension Mother   . Diabetes Mother   . Other Mother        varicose veins  . Hyperlipidemia Father   . Hypertension Father   . Emphysema Father        smoked  . Hypertension Daughter   . Asthma Sister   . Clotting disorder Daughter   . Breast cancer Maternal Grandmother   . Ovarian cancer Maternal Aunt     Objective: Office vital signs reviewed. BP 100/63   Pulse 70   Temp (!) 97.5 F (36.4 C)   Ht 5\' 7"  (1.702 m)   Wt 211 lb (95.7 kg)   SpO2 (!) 89% Comment: 2 Kelly of O2  BMI 33.05 kg/m   Physical Examination:  General: Awake, alert, chronically ill appearing, No acute distress HEENT: Normal, sclera white, no significant nasal drainage appreciated Cardio: regular rate and rhythm, S1S2 heard, no murmurs appreciated Pulm: decreased breath sounds bilaterally, no wheezes, rhonchi or rales; normal work of breathing on 3L O2 Psych: Mood stable, speech normal, affect appropriate.  Good eye contact.     Depression screen [facility] 2/9 09/20/2020 07/21/2020 06/20/2020  Decreased Interest 2 1 0  Down, Depressed, Hopeless 2 1 1   PHQ - 2 Score 4 2 1   Altered sleeping 1 0 2  Tired, decreased energy 1 1 2   Change in appetite 1 0 1  Feeling bad or failure about yourself  0 0 2  Trouble concentrating 0 0 1  Moving slowly or fidgety/restless 0 0 0  Suicidal thoughts 0 0 2  PHQ-9 Score 7 3 11   Difficult doing work/chores Not difficult at all Not difficult at all Somewhat difficult   Some recent data might be hidden   GAD 7 : Generalized Anxiety Score 09/20/2020 06/20/2020 02/11/2020 11/11/2019  Nervous, Anxious, on Edge 1 2 2  0  Control/stop worrying 0 2 3 0  Worry too much - different things 1 2 3  0  Trouble relaxing 0 2 1 0  Restless 0 2 1 0  Easily annoyed or irritable 0 1 3 1   Afraid - awful might happen 2 2 3 1   Total GAD 7 Score 4 13 16 2   Anxiety Difficulty Somewhat difficult Somewhat difficult Somewhat difficult Not difficult at all    Assessment/ Plan: 64 y.o. female   1. Psychophysiological insomnia Stable.  The national narcotic database was reviewed and there were no red flags - zolpidem (AMBIEN) 5 MG tablet; Take 1 tablet (5 mg total) by mouth at bedtime as needed for sleep.  Dispense: 30 tablet; Refill: 3  2. Mood swing - ARIPiprazole (ABILIFY) 5 MG tablet; TAKE 1 TABLET(5 MG) BY MOUTH DAILY  Dispense: 90 tablet; Refill: 1  3. Overactive bladder Start Myrbetriq. - mirabegron ER (MYRBETRIQ) 25 MG TB24 tablet; Take 1 tablet (25 mg total) by mouth daily.  Dispense: 30 tablet; Refill: 12   4. Seasonal allergic rhinitis due to pollen Claritin prescribed  5. Need for shingles vaccine Administered - Varicella-zoster vaccine IM (Shingrix)   Orders Placed This Encounter  Procedures  . Varicella-zoster vaccine IM (Shingrix)   Meds ordered this encounter  Medications  . ARIPiprazole (ABILIFY) 5 MG tablet    Sig: TAKE 1 TABLET(5 MG) BY MOUTH DAILY    Dispense:  90 tablet    Refill:  1  . zolpidem (AMBIEN) 5 MG tablet    Sig: Take 1 tablet (5 mg total) by mouth at bedtime as needed  for sleep.    Dispense:  30 tablet    Refill:  3  . citalopram (CELEXA) 10 MG tablet    Sig: Take 1 tablet (10 mg total) by mouth daily.    Dispense:  90 tablet    Refill:  3  . mirabegron ER (MYRBETRIQ) 25 MG TB24 tablet    Sig: Take 1 tablet (25 mg total) by mouth daily.    Dispense:  30 tablet    Refill:  12  . loratadine (CLARITIN) 10 MG tablet    Sig:  Take 1 tablet (10 mg total) by mouth daily.    Dispense:  30 tablet    Refill:  [place], [place] [phone]

## 2020-09-20 NOTE — Patient Instructions (Signed)
See Raynelle Fanning to get [employer] for free/ reduced cost Bring in copies of proof of income (you and your spouse's social security check stubs or a bank statement).  I have sent Myrbetriq for your bladder.  I have sent Claritin for your sinuses  You got your first shingles shot today.  We will repeat it at your next visit in 4 months

## 2020-10-20 ENCOUNTER — Encounter (HOSPITAL_COMMUNITY): Payer: Self-pay | Admitting: Cardiology

## 2020-10-20 ENCOUNTER — Ambulatory Visit (HOSPITAL_BASED_OUTPATIENT_CLINIC_OR_DEPARTMENT_OTHER)
Admission: RE | Admit: 2020-10-20 | Discharge: 2020-10-20 | Disposition: A | Discharge status: Home / Self Care | Payer: Medicare PPO | Source: Ambulatory Visit | Attending: Cardiology | Admitting: Cardiology

## 2020-10-20 ENCOUNTER — Other Ambulatory Visit: Payer: Self-pay

## 2020-10-20 ENCOUNTER — Ambulatory Visit (HOSPITAL_COMMUNITY)
Admission: RE | Admit: 2020-10-20 | Discharge: 2020-10-20 | Disposition: A | Discharge status: Home / Self Care | Payer: Medicare PPO | Source: Ambulatory Visit | Attending: Cardiology | Admitting: Cardiology

## 2020-10-20 VITALS — BP 124/78 | HR 57 | Wt 211.2 lb

## 2020-10-20 DIAGNOSIS — Z87891 Personal history of nicotine dependence: Secondary | ICD-10-CM | POA: Insufficient documentation

## 2020-10-20 DIAGNOSIS — I2729 Other secondary pulmonary hypertension: Secondary | ICD-10-CM

## 2020-10-20 DIAGNOSIS — D869 Sarcoidosis, unspecified: Secondary | ICD-10-CM | POA: Insufficient documentation

## 2020-10-20 DIAGNOSIS — I5032 Chronic diastolic (congestive) heart failure: Secondary | ICD-10-CM

## 2020-10-20 DIAGNOSIS — I509 Heart failure, unspecified: Secondary | ICD-10-CM | POA: Insufficient documentation

## 2020-10-20 DIAGNOSIS — I110 Hypertensive heart disease with heart failure: Secondary | ICD-10-CM | POA: Insufficient documentation

## 2020-10-20 DIAGNOSIS — I358 Other nonrheumatic aortic valve disorders: Secondary | ICD-10-CM | POA: Insufficient documentation

## 2020-10-20 LAB — BASIC METABOLIC PANEL
Anion gap: 13 (ref 5–15)
BUN: 12 mg/dL (ref 8–23)
CO2: 25 mmol/L (ref 22–32)
Calcium: 9.1 mg/dL (ref 8.9–10.3)
Chloride: 101 mmol/L (ref 98–111)
Creatinine, Ser: 0.96 mg/dL (ref 0.44–1.00)
GFR, Estimated: >60 mL/min (ref >60)
Glucose, Bld: 89 mg/dL (ref 70–99)
Potassium: 3.9 mmol/L (ref 3.5–5.1)
Sodium: 139 mmol/L (ref 135–145)

## 2020-10-20 LAB — ECHOCARDIOGRAM COMPLETE
AR max vel: 2.22 cm2
AV Area VTI: 1.83 cm2
AV Area mean vel: 1.69 cm2
AV Mean grad: 9 mmHg
AV Peak grad: 15.5 mmHg
Ao pk vel: 1.97 m/s
Area-P 1/2: 2.26 cm2
S' Lateral: 2.6 cm

## 2020-10-20 LAB — BRAIN NATRIURETIC PEPTIDE: B Natriuretic Peptide: 22.3 pg/mL (ref 0.0–100.0)

## 2020-10-20 NOTE — Progress Notes (Signed)
  Echocardiogram 2D Echocardiogram has been performed.  Kelly Donaldson 10/20/2020, 11:50 AM

## 2020-10-20 NOTE — Patient Instructions (Signed)
No medication changes.  Your physician recommends that you schedule a follow-up appointment in: 4 months with Dr Shirlee Latch  Please call office at [phone] option 2 if you have any questions or concerns.   At the [facility], you and your health needs are our priority. As part of our continuing mission to provide you with exceptional heart care, we have created designated Provider Care Teams. These Care Teams include your primary Cardiologist (physician) and Advanced Practice Providers (APPs- Physician Assistants and Nurse Practitioners) who all work together to provide you with the care you need, when you need it.   You may see any of the following providers on your designated Care Team at your next follow up: Marland Kitchen Dr Arvilla Meres . Dr [name] . Tonye Becket, NP . Robbie Lis, PA . Karle Plumber, PharmD   Please be sure to bring in all your medications bottles to every appointment.

## 2020-10-22 NOTE — Progress Notes (Signed)
Date:  10/22/2020   ID:  Kelly Donaldson, DOB 1956/04/27, MRN [id number]  Provider location: [place] [facility] Type of Visit: Established patient   PCP:  Kelly Norlander, DO  Cardiologist:  Dr. Aundra Donaldson   History of Present Illness: Kelly Donaldson is a 64 y.o. female who has a history of chronic hypoxemic respiratory failure and pulmonary hypertension.  She has been followed by Dr Kelly Donaldson, now follows with Dr. [place] Donaldson with pulmonology.  Has been on home oxygen for several years now.  She quit smoking in 2016.  She had an echo done in 5/16 showing dilated RV with severe pulmonary hypertension.  She therefore had RHC in 5/16 which confirmed severe pulmonary hypertension and RV failure.  PFTs showed only mild obstruction and restriction with markedly decreased DLCO suggestive of pulmonary vascular disease.  There was concern for sarcoidosis on CT chest but lymph node biopsy showed no evidence for sarcoidosis.  V/Q scan showed no evidence for chronic PE.   She has not tolerated Revatio or Adcirca.  Have failed previous up-titration of Selexipag with pelvic pain, can tolerate 200 mcg bid.  She is now tolerating riociguat 2 mg tid.   Started torsemide in Oct. 2017, has had better diuresis.   Due to ongoing dyspnea, she had repeat RHC in 5/19 that showed normal filling pressures and well-controlled pulmonary hypertension.  Echo in 6/19 showed normal LV EF, normal RV systolic function, and mild RV dilation.   She now is on Anoro, feels like this has helped her breathing.    Echo in 9/20 showed EF 55-60%, mild RV dilation with normal systolic function, PASP 26 mmHg, IVC normal.   She has been diagnosed with rheumatoid arthritis, which may explain her ILD.   Echo was done today and reviewed, EF 55-60% with normal RV, PASP 30, IVC normal.   She returns for followup of pulmonary HTN.  Weight is up 2 lbs.  She is on 2L home oxygen chronically.  She generally is feeling well today,  no dyspnea walking around the house. Dyspnea only with long distances.  Limited more by bilateral knee pain.  No chest pain.  No lightheadedness.    6 minute walk (5/16): 238 meters => decreased oxygen saturation to 73%, increased to 6 L [place] 6 minute walk (7/16): 170 meters => unable to complete.  6 minute walk (9/16): 201 meters 6 minute walk (1/17): 256 meters 6 minute walk (4/17): 219 meters 6 minute walk (8/17): 225 meters 6 minute walk (11/17): 244 meters 6 minute walk (5/18): 229 meters 6 minute walk (2/19): 122 meters (but she was stopped halfway through with drop in oxygen saturation).  6 minute walk (12/20): 304 meters 6 minute walk (8/21): 476 m  Labs (2/16): BNP 69 Labs (5/16): LFTs normal, HCT 34.9, RF negative, HIV negative, TSH negative Labs (6/16): K 4.4, creatinine 0.88 Labs (7/16): ANA negative, BNP 58, K 4.3, creatinine 1.1, anti-RNP negative Labs (9/16): K 4.4, creatinine 1.26, BNP 91 Labs (10/16): K 4.3, creatinine 1.04 Labs (12/16): K 4.2, creatinine 1.07, BNP 130 Labs (1/17): K 4.3, creatinine 1.23, BNP 42 Labs (3/17): K 4.2, creatinine 1.19 Labs (5/17): K 4.3, creatinine 1.35, BNP 44 Labs (6/17): K 4.4, creatinine 1.18, BNP 44 Labs (10/17): K 4.2, creatinine 0.98, BNP 43 Labs (12/17): K 4.5, creatinine 1.23. BNP 64.5 Labs (2/18): K 4.5, creatinine 1.21, BNP 64 Labs (4/18): K 4.1, creatinine 1.24, BNP 26 Labs (9/18): K 4.6, creatinine 1.25 Labs (  2/19): K 4.2, creatinine 1.39 Labs (5/19): K 4, creatinine 1.4, hgb 8.4 Labs (12/19): K 4.3, creatinine 1.15 Labs (6/20): TSH normal, K 4, creatinine 1.04, LDL 78 Labs (12/20): LDL 99, K 4.3, creatinine 1.0 Labs (4/21): BNP 36, K 4.1, creatinine 0.96 Labs (8/21): BNP 48, K 4.1, creatinine 0.88 . PMH: 1. Pulmonary hypertension: RHC (5/16) with mean RA 18, PA 83/33 mean 53, mean PCWP 19, CI 2.61, PVR 5.8 WU.   Echo (5/16) with EF 65-70%, septal flattening, dilated RV with PA systolic pressure 99 mmHg, +bubble study.   PFTs (4/16) with FVC 76%, FEV1 75%, ratio 97%, TLC 66%, DLCO 26% => mild restriction, mild obstruction, severe diffuse abnormality (pulmonary vascular problem).  CTA chest (4/16) with chronic interstitial and obstructive lung disease, small mediastinal and hilar lymph noes, no PE.  Lymph node biopsy negative for sarcoidosis (reactive changes).  V/Q scan (6/16): No acute or chronic PE. TEE (6/16): Normal LV size and systolic function, EF 99-83%,JA was mildly dilated with mildly decreased systolic function, D-shaped interventricular septum suggestive of RV pressure/volume overload, there appeared to be a small PFO present with some bubbles crossing, no ASD.  ANA negative, anti-RNP negative, HIV negative. Sleep study (8/16) without OSA.  She did not tolerate Adcirca or Revatio.  She cannot tolerate more than 200 mcg bid Selexipag.  - Echo (6/17): EF 25-05%, normal diastolic function, D-shaped interventricular septum, mildly dilated RV with normal RV systolic function, PASP 39, IVC normal.  - Echo (6/18): EF 39-76%, normal diastolic function, D-shaped interventricular septum, mildly dilated RV with normal RV systolic function, PASP 65 mmHg.  - RHC (7/18): RA 13, PA 51/23 mean 36, PCWP 19, CI 4.74, PVR 1.6 WU - RHC (5/19): mean RA 8, PA 48/20 mean 30, mean PCWP 13, CI 5.06, PVR 1.58 WU.  - Echo (6/19): EF 60-65%, mild RV dilation with normal RV systolic function, PASP 60, possible PFO.  - Echo (9/20): EF 55-60%, mild RV dilation with normal systolic function, PASP 26 mmHg, IVC normal.  - Echo (11/21): EF 55-60%, normal RV, PASP 30 mmHg, IVC normal.  2. Chronic venous insufficiency. 3. Hyperlipidemia.  4. GERD 5. Chronic hypoxemic respiratory failure: Hypoxemia, on home oxygen.  Seen by Dr Kelly Donaldson, has emphysema + interstitial lung disease (?UIP => suspect related to RA).  - PFTs (7/19): FEV1 82%, FVC 80%, ratio 101%, TLC 73%, DLCO 23%.  6. Fe deficiency anemia: EGD/colonoscopy at [facility] normal in 2020.   7. Rheumatoid arthritis: Azathioprine.   Current Outpatient Medications  Medication Sig Dispense Refill  . Accu-Chek Softclix Lancets lancets Check glucose BID Dx R73.03 200 each 3  . ADEMPAS 2 MG TABS Take 2 mg by mouth 3 (three) times daily. Dose changes may occur throughout the course of therapy as directed by your prescriber. 270 tablet 3  . albuterol (VENTOLIN HFA) 108 (90 Base) MCG/ACT inhaler Inhale 2 puffs into the lungs every 4 (four) hours as needed for wheezing or shortness of breath.    . Alcohol Swabs (B-D SINGLE USE SWABS REGULAR) PADS Check glucose BID Dx R73.03 200 each 3  . ARIPiprazole (ABILIFY) 5 MG tablet TAKE 1 TABLET(5 MG) BY MOUTH DAILY 90 tablet 1  . aspirin EC 81 MG tablet Take 1 tablet (81 mg total) by mouth daily. 90 tablet 3  . azaTHIOprine (IMURAN) 50 MG tablet     . Blood Glucose Calibration (ACCU-CHEK AVIVA) SOLN 1 Units by In Vitro route every 30 (thirty) days. 3 each 0  . Blood  Glucose Monitoring Suppl (ACCU-CHEK AVIVA PLUS) w/Device KIT Check glucose BID Dx R73.03 1 kit 0  . citalopram (CELEXA) 10 MG tablet Take 1 tablet (10 mg total) by mouth daily. 90 tablet 3  . ferrous sulfate (FERROUSUL) 325 (65 FE) MG tablet Take 1 tablet (325 mg total) by mouth daily with breakfast. 90 tablet 3  . fluticasone (FLONASE) 50 MCG/ACT nasal spray Place 1 spray into both nostrils daily. 48 g 3  . glucose blood (ACCU-CHEK AVIVA PLUS) test strip Check glucose BID Dx R73.03 100 each 12  . HYDROcodone-acetaminophen (NORCO) 10-325 MG tablet     . ipratropium-albuterol (DUONEB) 0.5-2.5 (3) MG/3ML SOLN INHALE THE CONTENTS OF 1 VIAL VIA NEBULIZER FOUR TIMES DAILY AS NEEDED 1080 mL 3  . loratadine (CLARITIN) 10 MG tablet Take 1 tablet (10 mg total) by mouth daily. 30 tablet 11  . macitentan (OPSUMIT) 10 MG tablet Take 1 tablet (10 mg total) by mouth daily. 90 tablet 3  . mirabegron ER (MYRBETRIQ) 25 MG TB24 tablet Take 1 tablet (25 mg total) by mouth daily. 30 tablet 12  . OXYGEN  Take 2.5-4 L by mouth See admin instructions. 2.5 L with resting state & 4 L with exertion [employer]    . Polyethyl Glycol-Propyl Glycol (LUBRICANT EYE DROPS) 0.4-0.3 % SOLN Place 1 drop into both eyes daily.    . potassium chloride SA (KLOR-CON) 20 MEQ tablet TAKE 1 TABLET TWICE DAILY 180 tablet 3  . pravastatin (PRAVACHOL) 20 MG tablet Take 1 tablet (20 mg total) by mouth daily. 90 tablet 3  . Selexipag 200 MCG TABS Take 1 tablet (200 mcg total) by mouth 2 (two) times daily. 180 tablet 3  . Tiotropium Bromide-Olodaterol (STIOLTO RESPIMAT) 2.5-2.5 MCG/ACT AERS Inhale 2 puffs into the lungs daily. 12 g 3  . torsemide (DEMADEX) 20 MG tablet Take 2 tablets (40 mg total) by mouth 2 (two) times daily. 360 tablet 3  . Vitamin D, Ergocalciferol, (DRISDOL) 1.25 MG (50000 UT) CAPS capsule TAKE 1 CAPSULE EVERY MONDAY 12 capsule 3  . zolpidem (AMBIEN) 5 MG tablet Take 1 tablet (5 mg total) by mouth at bedtime as needed for sleep. 30 tablet 3   No current facility-administered medications for this encounter.    Allergies:   Gabapentin and Latex   Social History:  The patient  reports that she quit smoking about 5 years ago. Her smoking use included cigarettes. She has a 4.25 pack-year smoking history. She has never used smokeless tobacco. She reports current drug use. Drug: Cocaine. She reports that she does not drink alcohol.   Family History:  The patient's family history includes Asthma in her sister; Breast cancer in her maternal grandmother; Clotting disorder in her daughter; Diabetes in her mother; Emphysema in her father; Hyperlipidemia in her father and mother; Hypertension in her daughter, father, and mother; Other in her mother; Ovarian cancer in her maternal aunt.   ROS:  Please see the history of present illness.   All other systems are personally reviewed and negative.   Exam:   BP 124/78   Pulse (!) 57   Wt 95.8 kg (211 lb 3.2 oz)   SpO2 95% Comment: 2L n/c  BMI 33.08 kg/m  General:  NAD Neck: No JVD, no thyromegaly or thyroid nodule.  Lungs: Mild crackles at bases.  CV: Nondisplaced PMI.  Heart regular S1/S2, no S3/S4, no murmur.  No peripheral edema.  No carotid bruit.  Normal pedal pulses.  Abdomen: Soft, nontender, no hepatosplenomegaly, no  distention.  Skin: Intact without lesions or rashes.  Neurologic: Alert and oriented x 3.  Psych: Normal affect. Extremities: No clubbing or cyanosis.  HEENT: Normal. =  Recent Labs: 11/11/2019: ALT 12; Hemoglobin 12.2; Platelets 168; TSH 0.867 10/20/2020: B Natriuretic Peptide 22.3; BUN 12; Creatinine, Ser 0.96; Potassium 3.9; Sodium 139  Personally reviewed   Wt Readings from Last 3 Encounters:  10/20/20 95.8 kg (211 lb 3.2 oz)  09/20/20 95.7 kg (211 lb)  07/21/20 94.3 kg (208 lb)      ASSESSMENT AND PLAN:  1. Pulmonary arterial hypertension with RV failure: Severe on 5/16 RHC. 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  There was concern from CTA chest for sarcoidosis, but lymph node biopsy was negative.  ILD is likely related to rheumatoid arthritis. LFTs normal, ANA negative, anti-RNP negative, HIV negative.  V/Q scan negative for chronic PE.  Small PFO but no ASD on TEE. No OSA on sleep study.  She did not tolerate Revatio due to joint pain (now resolved), and she did not tolerate Adcirca with worsening dyspnea. She does not tolerate more than 200 mcg bid Selexipag due to pelvic pain.   RHCs in 7/18 and 5/19 have shown well-compensated pulmonary hypertension.  PVR was 1.58 WU on 5/19 study. Echo today showed EF 55-60% with normal RV, PASP 30, normal IVC.  She is improved symptomatically. I think that a lot of her residual dyspnea is due to COPD + ILD rather than pulmonary hypertension.   - Continue oxygen.  - Open lung biopsy deemed too risky, probably not sarcoid per pulmonary evaluation => suspect ILD related to RA.    - Continue  opsumit and riociguat 2 mg twice a day (unable to go higher due to dizziness).   - She has tolerated selexipag 200 mcg bid but has not been able to increase.  - Check BNP today.  2. Chronic hypoxemic respiratory failure: Chronic interstitial lung disease + emphysema.  Concern for sarcoidosis but biopsy did not show sarcoid.  Deemed too high risk for open lung biopsy.  Dr. 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  She has been diagnosed with RA, this may be the source of her ILD.  - Continue pulmonary followup, has appointment soon.  3. Chronic diastolic CHF: Echo 5/46 with EF 55-60%, RV mildly dilated, normal function, PA peak pressure 26 mm Hg. NYHA II. She is not volume overloaded on exam. - Continue torsemide 40 mg bid. BMET today.     4. Hyperlipidemia: Lipids ok in 9/20.  5. Rheumatoid arthritis: On azathioprine, follows with Dr. Domingo Sep.   Followup 4 months.   Signed, Loralie Champagne, MD  10/22/2020  [place] [address] [facility] [place]  [phone] (office) [phone] (fax)

## 2020-10-22 NOTE — Addendum Note (Signed)
Encounter addended by: Laurey Morale, MD on: 10/22/2020 10:27 PM  Actions taken: Level of Service modified, Visit diagnoses modified, Clinical Note Signed

## 2020-10-23 ENCOUNTER — Telehealth (HOSPITAL_COMMUNITY): Payer: Self-pay | Admitting: Pharmacy Technician

## 2020-10-23 NOTE — Telephone Encounter (Signed)
Sent in Medicare D attestation to [employer] in order to get patient assistance on Uptravi and Opsumit through the end of the year.  Patient was approved as of 10/20/20 through 12/08/20.  Archer Asa, CPhT

## 2020-10-31 ENCOUNTER — Telehealth: Payer: Self-pay

## 2020-10-31 DIAGNOSIS — J309 Allergic rhinitis, unspecified: Secondary | ICD-10-CM

## 2020-10-31 MED ORDER — FLUTICASONE PROPIONATE 50 MCG/ACT NA SUSP: 1.0000 | Freq: Every day | NASAL | 3 refills | Status: DC

## 2020-10-31 NOTE — Telephone Encounter (Signed)
Refill of flonase sent to pharmacy

## 2020-10-31 NOTE — Telephone Encounter (Signed)
  Prescription Request  10/31/2020  What is the name of the medication or equipment? flonase  Have you contacted your pharmacy to request a refill? (if applicable) no  Which pharmacy would you like this sent to? [facility]   Patient notified that their request is being sent to the clinical staff for review and that they should receive a response within 2 business days.

## 2020-11-10 ENCOUNTER — Other Ambulatory Visit: Payer: Self-pay | Admitting: *Deleted

## 2020-11-10 DIAGNOSIS — F5104 Psychophysiologic insomnia: Secondary | ICD-10-CM

## 2020-11-10 MED ORDER — CITALOPRAM HYDROBROMIDE 10 MG PO TABS: 10.0000 mg | ORAL_TABLET | Freq: Every day | ORAL | 2 refills | Status: DC

## 2020-11-23 ENCOUNTER — Telehealth (HOSPITAL_COMMUNITY): Payer: Self-pay | Admitting: Pharmacist

## 2020-11-23 NOTE — Telephone Encounter (Signed)
Patient Advocate Encounter   Received notification from [facility] that prior authorization for Opsumit is required.   PA submitted on CoverMyMeds Key [id number] Status is pending   Will continue to follow.   Karle Plumber, PharmD, BCPS, BCCP, CPP [facility] Failure Clinic Pharmacist [phone]

## 2020-11-23 NOTE — Telephone Encounter (Signed)
Patient Advocate Encounter   Received notification from [facility] that prior authorization for Adempas is required.   PA submitted on CoverMyMeds Key [id number] Status is pending   Will continue to follow.   Karle Plumber, PharmD, BCPS, BCCP, CPP Heart Failure Clinic Pharmacist [phone]

## 2020-11-23 NOTE — Telephone Encounter (Signed)
Patient Advocate Encounter   Received notification from [facility] that prior authorization for Kelly Donaldson is required.   PA submitted on CoverMyMeds Key [id number] Status is pending   Will continue to follow.   Karle Plumber, PharmD, BCPS, BCCP, CPP [facility] Failure Clinic Pharmacist [phone]

## 2020-11-24 NOTE — Telephone Encounter (Signed)
Advanced Heart Failure Patient Advocate Encounter  Prior Authorizations for Opsumit, Adempas, and [name] have been approved.    Effective dates: 12/09/20 through 12/08/21  Karle Plumber, PharmD, BCPS, BCCP, CPP Heart Failure Clinic Pharmacist [phone]

## 2020-12-29 ENCOUNTER — Ambulatory Visit (INDEPENDENT_AMBULATORY_CARE_PROVIDER_SITE_OTHER): Payer: Medicare PPO | Admitting: Nurse Practitioner

## 2020-12-29 ENCOUNTER — Encounter: Payer: Self-pay | Admitting: Nurse Practitioner

## 2020-12-29 DIAGNOSIS — J00 Acute nasopharyngitis [common cold]: Secondary | ICD-10-CM

## 2020-12-29 MED ORDER — PROMETHAZINE-DM 6.25-15 MG/5ML PO SYRP: 5.0000 mL | ORAL_SOLUTION | Freq: Four times a day (QID) | ORAL | 0 refills | Status: DC | PRN

## 2020-12-29 MED ORDER — PREDNISONE 20 MG PO TABS: 40.0000 mg | ORAL_TABLET | Freq: Every day | ORAL | 0 refills | Status: AC

## 2020-12-29 NOTE — Progress Notes (Signed)
Virtual Visit via telephone Note Due to COVID-19 pandemic this visit was conducted virtually. This visit type was conducted due to national recommendations for restrictions regarding the COVID-19 Pandemic (e.g. social distancing, sheltering in place) in an effort to limit this patient's exposure and mitigate transmission in our community. All issues noted in this document were discussed and addressed.  A physical exam was not performed with this format.  I connected with Kelly Donaldson on 12/29/20 at 2:40 by telephone and verified that I am speaking with the correct person using two identifiers. Kelly Donaldson is currently located at home and  No one is currently with her during visit. The provider, Mary-Margaret Daphine Deutscher, FNP is located in their office at time of visit.  I discussed the limitations, risks, security and privacy concerns of performing an evaluation and management service by telephone and the availability of in person appointments. I also discussed with the patient that there may be a patient responsible charge related to this service. The patient expressed understanding and agreed to proceed.   History and Present Illness:   Chief Complaint: URI   HPI Patient calls in today c/o cough and chest congestion for 2 days. Her granddaughter has been sick and she has bene drinking after her. Feels like she has a head cold. Has been using nebulizer. She has had covid vaccines and says she KNOWS she doe snot have covid.   * she has COPD and heart disease- is on oxygen at home.  Review of Systems  Constitutional: Negative for chills, fever and malaise/fatigue.  HENT: Positive for congestion. Negative for ear discharge, ear pain and sore throat.   Respiratory: Negative for cough, sputum production and shortness of breath.   Musculoskeletal: Negative for myalgias.  Neurological: Positive for headaches. Negative for dizziness.     Observations/Objective: Alert and oriented- answers  all questions appropriately No distress Voice hoarse No cough and no sob noted   Assessment and Plan: Kelly Donaldson in today with chief complaint of URI   1. Acute nasopharyngitis 1. Take meds as prescribed 2. Use a cool mist humidifier especially during the winter months and when heat has been humid. 3. Use saline nose sprays frequently 4. Saline irrigations of the nose can be very helpful if done frequently.  * 4X daily for 1 week*  * Use of a nettie pot can be helpful with this. Follow directions with this* 5. Drink plenty of fluids 6. Keep thermostat turn down low 7.For any cough or congestion  Use plain Mucinex- regular strength or max strength is fine   * Children- consult with Pharmacist for dosing 8. For fever or aces or pains- take tylenol or ibuprofen appropriate for age and weight.  * for fevers greater than 101 orally you may alternate ibuprofen and tylenol every  3 hours.   Meds ordered this encounter  Medications   predniSONE (DELTASONE) 20 MG tablet    Sig: Take 2 tablets (40 mg total) by mouth daily with breakfast for 5 days. 2 po daily for 5 days    Dispense:  10 tablet    Refill:  0    Order Specific Question:   Supervising Provider    Answer:   Arville Care A [[id number]]   promethazine-dextromethorphan (PROMETHAZINE-DM) 6.25-15 MG/5ML syrup    Sig: Take 5 mLs by mouth 4 (four) times daily as needed for cough.    Dispense:  118 mL    Refill:  0    Order Specific  Question:   Supervising Provider    Answer:   Arville Care A [[id number]]   If develops SOB go to the ED     Follow Up Instructions: prn    I discussed the assessment and treatment plan with the patient. The patient was provided an opportunity to ask questions and all were answered. The patient agreed with the plan and demonstrated an understanding of the instructions.   The patient was advised to call back or seek an in-person evaluation if the symptoms worsen or if the condition  fails to improve as anticipated.  The above assessment and management plan was discussed with the patient. The patient verbalized understanding of and has agreed to the management plan. Patient is aware to call the clinic if symptoms persist or worsen. Patient is aware when to return to the clinic for a follow-up visit. Patient educated on when it is appropriate to go to the emergency department.   Time call ended:  2:53  I provided 13 minutes of non-face-to-face time during this encounter.    Mary-Margaret Daphine Deutscher, FNP

## 2021-01-18 ENCOUNTER — Other Ambulatory Visit: Payer: Self-pay | Admitting: *Deleted

## 2021-01-18 DIAGNOSIS — I270 Primary pulmonary hypertension: Secondary | ICD-10-CM

## 2021-01-18 DIAGNOSIS — I5032 Chronic diastolic (congestive) heart failure: Secondary | ICD-10-CM

## 2021-01-18 MED ORDER — POTASSIUM CHLORIDE CRYS ER 20 MEQ PO TBCR: EXTENDED_RELEASE_TABLET | ORAL | 0 refills | Status: DC

## 2021-01-18 MED ORDER — ACCU-CHEK SOFTCLIX LANCETS MISC: 3 refills | Status: AC

## 2021-01-18 MED ORDER — ACCU-CHEK AVIVA VI SOLN: 1.0000 [IU] | 0 refills | Status: DC

## 2021-01-18 MED ORDER — TORSEMIDE 20 MG PO TABS: 40.0000 mg | ORAL_TABLET | Freq: Two times a day (BID) | ORAL | 0 refills | Status: DC

## 2021-01-18 MED ORDER — PRAVASTATIN SODIUM 20 MG PO TABS: 20.0000 mg | ORAL_TABLET | Freq: Every day | ORAL | 0 refills | Status: DC

## 2021-01-18 MED ORDER — ACCU-CHEK AVIVA PLUS VI STRP: ORAL_STRIP | 3 refills | Status: AC

## 2021-01-22 ENCOUNTER — Other Ambulatory Visit: Payer: Self-pay

## 2021-01-22 ENCOUNTER — Ambulatory Visit (INDEPENDENT_AMBULATORY_CARE_PROVIDER_SITE_OTHER): Payer: Medicare PPO | Admitting: Family Medicine

## 2021-01-22 ENCOUNTER — Other Ambulatory Visit: Payer: Self-pay | Admitting: *Deleted

## 2021-01-22 VITALS — BP 90/57 | HR 71 | Temp 98.1°F | Ht 67.0 in | Wt 215.0 lb

## 2021-01-22 DIAGNOSIS — J9611 Chronic respiratory failure with hypoxia: Secondary | ICD-10-CM | POA: Diagnosis not present

## 2021-01-22 DIAGNOSIS — E7800 Pure hypercholesterolemia, unspecified: Secondary | ICD-10-CM | POA: Diagnosis not present

## 2021-01-22 DIAGNOSIS — F5104 Psychophysiologic insomnia: Secondary | ICD-10-CM | POA: Diagnosis not present

## 2021-01-22 DIAGNOSIS — N3281 Overactive bladder: Secondary | ICD-10-CM

## 2021-01-22 DIAGNOSIS — J309 Allergic rhinitis, unspecified: Secondary | ICD-10-CM

## 2021-01-22 DIAGNOSIS — Z23 Encounter for immunization: Secondary | ICD-10-CM

## 2021-01-22 DIAGNOSIS — I2721 Secondary pulmonary arterial hypertension: Secondary | ICD-10-CM | POA: Diagnosis not present

## 2021-01-22 MED ORDER — ACCU-CHEK SOFTCLIX LANCET DEV KIT: PACK | 0 refills | Status: AC

## 2021-01-22 MED ORDER — BD SWAB SINGLE USE REGULAR PADS: MEDICATED_PAD | 3 refills | Status: AC

## 2021-01-22 MED ORDER — ZOLPIDEM TARTRATE 5 MG PO TABS: 5.0000 mg | ORAL_TABLET | Freq: Every evening | ORAL | 3 refills | Status: DC | PRN

## 2021-01-22 MED ORDER — FLUTICASONE PROPIONATE 50 MCG/ACT NA SUSP: 1.0000 | Freq: Every day | NASAL | 3 refills | Status: DC

## 2021-01-22 NOTE — Progress Notes (Signed)
Subjective: CC: Insomnia PCP: Kelly Norlander, DO VQM:GQQPYPP L Kelly Donaldson is a 65 y.o. female presenting to clinic today for:  1. Insomnia Patient reports stability with zolpidem 5 mg nightly. She has been having some difficulty obtaining her medication every 30 days, particularly when the month is 31 days long. She denies excessive daytime sedation, worsening breathing, visual or auditory hallucinations  2. Chronic respiratory failure/ O2 use/ PAH Stable on 3 L. Will be seeing a pulmonologist soon. Had recent URI treated with prednisone. She was worried because she was not treated with an antibiotic but seems to be doing well now.  3. HLD No reports of chest pain, abdominal pain. Compliant with pravastatin  4. Overactive bladder Reports that she is doing well. Not getting up as frequently. Using the [facility] as needed. No side effects    ROS: Per HPI  Allergies  Allergen Reactions  . Gabapentin Other (See Comments)    Pt states that she can not take with antidepressants  . Latex Rash   Past Medical History:  Diagnosis Date  . Allergy   . Depression   . GERD (gastroesophageal reflux disease)   . Hyperlipidemia   . Oxygen dependent   . Shortness of breath dyspnea   . Varicose veins     Current Outpatient Medications:  .  Accu-Chek Softclix Lancets lancets, Check glucose BID Dx R73.03, Disp: 200 each, Rfl: 3 .  ADEMPAS 2 MG TABS, Take 2 mg by mouth 3 (three) times daily. Dose changes may occur throughout the course of therapy as directed by your prescriber., Disp: 270 tablet, Rfl: 3 .  albuterol (VENTOLIN HFA) 108 (90 Base) MCG/ACT inhaler, Inhale 2 puffs into the lungs every 4 (four) hours as needed for wheezing or shortness of breath., Disp: , Rfl:  .  Alcohol Swabs (B-D SINGLE USE SWABS REGULAR) PADS, Check glucose BID Dx R73.03, Disp: 200 each, Rfl: 3 .  ARIPiprazole (ABILIFY) 5 MG tablet, TAKE 1 TABLET(5 MG) BY MOUTH DAILY, Disp: 90 tablet, Rfl: 1 .  aspirin EC 81  MG tablet, Take 1 tablet (81 mg total) by mouth daily., Disp: 90 tablet, Rfl: 3 .  azaTHIOprine (IMURAN) 50 MG tablet, , Disp: , Rfl:  .  Blood Glucose Calibration (ACCU-CHEK AVIVA) SOLN, 1 Units by In Vitro route every 30 (thirty) days., Disp: 3 each, Rfl: 0 .  Blood Glucose Monitoring Suppl (ACCU-CHEK AVIVA PLUS) w/Device KIT, Check glucose BID Dx R73.03, Disp: 1 kit, Rfl: 0 .  citalopram (CELEXA) 10 MG tablet, Take 1 tablet (10 mg total) by mouth daily., Disp: 90 tablet, Rfl: 2 .  ferrous sulfate (FERROUSUL) 325 (65 FE) MG tablet, Take 1 tablet (325 mg total) by mouth daily with breakfast., Disp: 90 tablet, Rfl: 3 .  fluticasone (FLONASE) 50 MCG/ACT nasal spray, Place 1 spray into both nostrils daily., Disp: 48 g, Rfl: 3 .  glucose blood (ACCU-CHEK AVIVA PLUS) test strip, Check glucose BID Dx R73.03, Disp: 200 each, Rfl: 3 .  HYDROcodone-acetaminophen (NORCO) 10-325 MG tablet, , Disp: , Rfl:  .  ipratropium-albuterol (DUONEB) 0.5-2.5 (3) MG/3ML SOLN, INHALE THE CONTENTS OF 1 VIAL VIA NEBULIZER FOUR TIMES DAILY AS NEEDED, Disp: 1080 mL, Rfl: 3 .  loratadine (CLARITIN) 10 MG tablet, Take 1 tablet (10 mg total) by mouth daily., Disp: 30 tablet, Rfl: 11 .  macitentan (OPSUMIT) 10 MG tablet, Take 1 tablet (10 mg total) by mouth daily., Disp: 90 tablet, Rfl: 3 .  mirabegron ER (MYRBETRIQ) 25 MG TB24 tablet,  Take 1 tablet (25 mg total) by mouth daily., Disp: 30 tablet, Rfl: 12 .  OXYGEN, Take 2.5-4 L by mouth See admin instructions. 2.5 L with resting state & 4 L with exertion [employer], Disp: , Rfl:  .  Polyethyl Glycol-Propyl Glycol (LUBRICANT EYE DROPS) 0.4-0.3 % SOLN, Place 1 drop into both eyes daily., Disp: , Rfl:  .  potassium chloride SA (KLOR-CON) 20 MEQ tablet, TAKE 1 TABLET TWICE DAILY, Disp: 180 tablet, Rfl: 0 .  pravastatin (PRAVACHOL) 20 MG tablet, Take 1 tablet (20 mg total) by mouth daily., Disp: 90 tablet, Rfl: 0 .  Selexipag 200 MCG TABS, Take 1 tablet (200 mcg total) by mouth 2  (two) times daily., Disp: 180 tablet, Rfl: 3 .  Tiotropium Bromide-Olodaterol (STIOLTO RESPIMAT) 2.5-2.5 MCG/ACT AERS, Inhale 2 puffs into the lungs daily., Disp: 12 g, Rfl: 3 .  torsemide (DEMADEX) 20 MG tablet, Take 2 tablets (40 mg total) by mouth 2 (two) times daily., Disp: 360 tablet, Rfl: 0 .  Vitamin D, Ergocalciferol, (DRISDOL) 1.25 MG (50000 UT) CAPS capsule, TAKE 1 CAPSULE EVERY MONDAY, Disp: 12 capsule, Rfl: 3 .  zolpidem (AMBIEN) 5 MG tablet, Take 1 tablet (5 mg total) by mouth at bedtime as needed for sleep., Disp: 30 tablet, Rfl: 3 Social History   Socioeconomic History  . Marital status: Married    Spouse name: Kelly Donaldson  . Number of children: 3  . Years of education: Not on file  . Highest education level: 12th grade  Occupational History  . Occupation: Disabled  Tobacco Use  . Smoking status: Former Smoker    Packs/day: 0.25    Years: 17.00    Pack years: 4.25    Types: Cigarettes    Quit date: 03/02/2015    Years since quitting: 5.8  . Smokeless tobacco: Never Used  Vaping Use  . Vaping Use: Never used  Substance and Sexual Activity  . Alcohol use: No    Alcohol/week: 0.0 standard drinks  . 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 Sexual activity: Not Currently  Other Topics Concern  . Not on file  Social History Narrative   Disabled, lives with husband Kelly Donaldson. 3 children. Enjoys gardening and spending time with grandchildren.    Social Determinants of Health   Financial Resource Strain: Not on file  Food Insecurity: Not on file  Transportation Needs: Not on file  Physical Activity: Not on file  Stress: Not on file  Social Connections: Not on file  Intimate Partner Violence: Not on file   Family History  Problem Relation Age of Onset  . Hyperlipidemia Mother   . Hypertension Mother   . Diabetes Mother   . Other Mother        varicose veins  . Hyperlipidemia Father   . Hypertension Father   . Emphysema Father         smoked  . Hypertension Daughter   . Asthma Sister   . Clotting disorder Daughter   . Breast cancer Maternal Grandmother   . Ovarian cancer Maternal Aunt     Objective: Office vital signs reviewed. BP (!) 90/57   Pulse 71   Temp 98.1 F (36.7 C) (Temporal)   Ht $R'5\' 7"'uq$  (1.702 m)   Wt 215 lb (97.5 kg)   SpO2 95%   BMI 33.67 kg/m   Physical Examination:  General: Awake, alert, well nourished, No acute distress HEENT: Normal, sclera white, MMM  Cardio: regular rate and rhythm, S1S2 heard, no murmurs appreciated Pulm: clear to auscultation bilaterally, no wheezes, rhonchi or rales; normal work of breathing on 3L O2 Extremities: warm, well perfused, trace ankle edema on left, No cyanosis or clubbing; +2 pulses bilaterally Psych: Mood is stable, speech is normal, affect is appropriate. Patient is pleasant and interactive  Assessment/ Plan: 65 y.o. female   Psychophysiological insomnia - Plan: zolpidem (AMBIEN) 5 MG tablet  Chronic respiratory failure with hypoxia ([place]) - Plan: CBC  PAH (pulmonary artery hypertension) ([place]) - Plan: CBC  Pure hypercholesterolemia - Plan: Lipid Panel, CMP14+EGFR  Overactive bladder  Need for shingles vaccine   Sleep is stable with 5 mg of Ambien. National narcotic database was reviewed and there were no red flags. Rx has been sent. Follow-up in 4 months  Stable on 3 L O2. Follow-up with pulmonology and cardiology as scheduled  Check lipid panel, CMP. Continue statin  Overactive bladder is stable with as needed Myrbetriq use  Was due for shingles vaccine but fortunately we did not have this in stock. She will return at a later date to have her second shot  No orders of the defined types were placed in this encounter.  No orders of the defined types were placed in this encounter.    Kelly Norlander, DO [place] [phone]

## 2021-01-23 LAB — CMP14+EGFR
ALT: 6 IU/L (ref 0–32)
AST: 14 IU/L (ref 0–40)
Albumin/Globulin Ratio: 1.8 (ref 1.2–2.2)
Albumin: 4.2 g/dL (ref 3.8–4.8)
Alkaline Phosphatase: 61 IU/L (ref 44–121)
BUN/Creatinine Ratio: 11 — ABNORMAL LOW (ref 12–28)
BUN: 11 mg/dL (ref 8–27)
Bilirubin Total: 0.3 mg/dL (ref 0.0–1.2)
CO2: 25 mmol/L (ref 20–29)
Calcium: 8.9 mg/dL (ref 8.7–10.3)
Chloride: 100 mmol/L (ref 96–106)
Creatinine, Ser: 1.03 mg/dL — ABNORMAL HIGH (ref 0.57–1.00)
GFR calc Af Amer: 66 mL/min/{1.73_m2} (ref >59)
GFR calc non Af Amer: 58 mL/min/{1.73_m2} — ABNORMAL LOW (ref >59)
Globulin, Total: 2.4 g/dL (ref 1.5–4.5)
Glucose: 87 mg/dL (ref 65–99)
Potassium: 4.1 mmol/L (ref 3.5–5.2)
Sodium: 143 mmol/L (ref 134–144)
Total Protein: 6.6 g/dL (ref 6.0–8.5)

## 2021-01-23 LAB — LIPID PANEL
Chol/HDL Ratio: 2.9 ratio (ref 0.0–4.4)
Cholesterol, Total: 225 mg/dL — ABNORMAL HIGH (ref 100–199)
HDL: 78 mg/dL (ref >39)
LDL Chol Calc (NIH): 129 mg/dL — ABNORMAL HIGH (ref 0–99)
Triglycerides: 104 mg/dL (ref 0–149)
VLDL Cholesterol Cal: 18 mg/dL (ref 5–40)

## 2021-01-23 LAB — CBC
Hematocrit: 37.4 % (ref 34.0–46.6)
Hemoglobin: 12.2 g/dL (ref 11.1–15.9)
MCH: 29.2 pg (ref 26.6–33.0)
MCHC: 32.6 g/dL (ref 31.5–35.7)
MCV: 90 fL (ref 79–97)
Platelets: 219 10*3/uL (ref 150–450)
RBC: 4.18 x10E6/uL (ref 3.77–5.28)
RDW: 13 % (ref 11.7–15.4)
WBC: 6.6 10*3/uL (ref 3.4–10.8)

## 2021-01-24 ENCOUNTER — Other Ambulatory Visit (HOSPITAL_COMMUNITY): Payer: Self-pay

## 2021-01-24 ENCOUNTER — Other Ambulatory Visit (HOSPITAL_COMMUNITY): Payer: Self-pay | Admitting: Cardiology

## 2021-01-24 DIAGNOSIS — I270 Primary pulmonary hypertension: Secondary | ICD-10-CM

## 2021-01-24 MED ORDER — ADEMPAS 2 MG PO TABS: 2.0000 mg | ORAL_TABLET | Freq: Three times a day (TID) | ORAL | 0 refills | Status: DC

## 2021-01-25 ENCOUNTER — Other Ambulatory Visit: Payer: Self-pay | Admitting: *Deleted

## 2021-02-13 ENCOUNTER — Ambulatory Visit (HOSPITAL_COMMUNITY)
Admission: RE | Admit: 2021-02-13 | Discharge: 2021-02-13 | Disposition: A | Discharge status: Home / Self Care | Payer: Medicare PPO | Source: Ambulatory Visit | Attending: Cardiology | Admitting: Cardiology

## 2021-02-13 ENCOUNTER — Encounter (HOSPITAL_COMMUNITY): Payer: Self-pay | Admitting: Cardiology

## 2021-02-13 ENCOUNTER — Other Ambulatory Visit: Payer: Self-pay

## 2021-02-13 VITALS — BP 104/60 | HR 71 | Wt 217.0 lb

## 2021-02-13 DIAGNOSIS — Z8349 Family history of other endocrine, nutritional and metabolic diseases: Secondary | ICD-10-CM | POA: Insufficient documentation

## 2021-02-13 DIAGNOSIS — M069 Rheumatoid arthritis, unspecified: Secondary | ICD-10-CM | POA: Insufficient documentation

## 2021-02-13 DIAGNOSIS — I5022 Chronic systolic (congestive) heart failure: Secondary | ICD-10-CM | POA: Diagnosis not present

## 2021-02-13 DIAGNOSIS — I270 Primary pulmonary hypertension: Secondary | ICD-10-CM | POA: Diagnosis not present

## 2021-02-13 DIAGNOSIS — Z87891 Personal history of nicotine dependence: Secondary | ICD-10-CM | POA: Diagnosis not present

## 2021-02-13 DIAGNOSIS — I2721 Secondary pulmonary arterial hypertension: Secondary | ICD-10-CM | POA: Insufficient documentation

## 2021-02-13 DIAGNOSIS — E785 Hyperlipidemia, unspecified: Secondary | ICD-10-CM | POA: Insufficient documentation

## 2021-02-13 DIAGNOSIS — Z7982 Long term (current) use of aspirin: Secondary | ICD-10-CM | POA: Diagnosis not present

## 2021-02-13 DIAGNOSIS — J9611 Chronic respiratory failure with hypoxia: Secondary | ICD-10-CM | POA: Diagnosis not present

## 2021-02-13 DIAGNOSIS — I5032 Chronic diastolic (congestive) heart failure: Secondary | ICD-10-CM | POA: Insufficient documentation

## 2021-02-13 DIAGNOSIS — J849 Interstitial pulmonary disease, unspecified: Secondary | ICD-10-CM | POA: Diagnosis not present

## 2021-02-13 DIAGNOSIS — Z79899 Other long term (current) drug therapy: Secondary | ICD-10-CM | POA: Diagnosis not present

## 2021-02-13 DIAGNOSIS — I2729 Other secondary pulmonary hypertension: Secondary | ICD-10-CM | POA: Diagnosis not present

## 2021-02-13 DIAGNOSIS — D869 Sarcoidosis, unspecified: Secondary | ICD-10-CM

## 2021-02-13 LAB — BASIC METABOLIC PANEL
Anion gap: 8 (ref 5–15)
BUN: 10 mg/dL (ref 8–23)
CO2: 28 mmol/L (ref 22–32)
Calcium: 9 mg/dL (ref 8.9–10.3)
Chloride: 100 mmol/L (ref 98–111)
Creatinine, Ser: 0.89 mg/dL (ref 0.44–1.00)
GFR, Estimated: >60 mL/min (ref >60)
Glucose, Bld: 93 mg/dL (ref 70–99)
Potassium: 4 mmol/L (ref 3.5–5.1)
Sodium: 136 mmol/L (ref 135–145)

## 2021-02-13 LAB — BRAIN NATRIURETIC PEPTIDE: B Natriuretic Peptide: 35.3 pg/mL (ref 0.0–100.0)

## 2021-02-13 MED ORDER — OPSUMIT 10 MG PO TABS: 10.0000 mg | ORAL_TABLET | Freq: Every day | ORAL | 3 refills | Status: DC

## 2021-02-13 MED ORDER — TORSEMIDE 20 MG PO TABS: 40.0000 mg | ORAL_TABLET | Freq: Two times a day (BID) | ORAL | 3 refills | Status: DC

## 2021-02-13 MED ORDER — UPTRAVI 200 MCG PO TABS: 1.0000 | ORAL_TABLET | Freq: Two times a day (BID) | ORAL | 11 refills | Status: DC

## 2021-02-13 MED ORDER — ADEMPAS 2 MG PO TABS: 2.0000 mg | ORAL_TABLET | Freq: Three times a day (TID) | ORAL | 3 refills | Status: DC

## 2021-02-13 NOTE — Patient Instructions (Signed)
6 minute walk test done today.  Labs done today. We will contact you only if your labs are abnormal.  INCREASE Torsemide to 73m (3 tablets) by mouth every morning and 40mg  (2 tablets) by mouth every evening for 3 days THEN DECREASE back to 40mg  (2 tablets) by mouth 2 times daily.  No other medication changes were made. Please continue all current medications as prescribed.  Your physician recommends that you schedule a follow-up appointment in: 3 months   If you have any questions or concerns before your next appointment please send a message through [place] or call our office at [phone].    TO LEAVE A MESSAGE FOR THE NURSE SELECT OPTION 2, PLEASE LEAVE A MESSAGE INCLUDING: . YOUR NAME . DATE OF BIRTH . CALL BACK NUMBER . REASON FOR CALL**this is important as we prioritize the call backs  YOU WILL RECEIVE A CALL BACK THE SAME DAY AS LONG AS YOU CALL BEFORE 4:00 PM   Do the following things EVERYDAY: 1) Weigh yourself in the morning before breakfast. Write it down and keep it in a log. 2) Take your medicines as prescribed 3) Eat low salt foods--Limit salt (sodium) to 2000 mg per day.  4) Stay as active as you can everyday 5) Limit all fluids for the day to less than 2 liters   At the [facility], you and your health needs are our priority. As part of our continuing mission to provide you with exceptional heart care, we have created designated Provider Care Teams. These Care Teams include your primary Cardiologist (physician) and Advanced Practice Providers (APPs- Physician Assistants and Nurse Practitioners) who all work together to provide you with the care you need, when you need it.   You may see any of the following providers on your designated Care Team at your next follow up: [address] [phone] . Dr Marland Kitchen . Arvilla Meres, NP . [name], PA . Tonye Becket, PharmD   Please be sure to bring in all your medications bottles to every  appointment.

## 2021-02-13 NOTE — Progress Notes (Signed)
6 Min Walk Test Completed  Pt ambulated 213.3 meters O2 Sat ranged 74%-97% on 4-6L oxygen HR ranged 73-106

## 2021-02-13 NOTE — Progress Notes (Signed)
Date:  02/13/2021   ID:  Kelly Donaldson, DOB 1956-07-11, MRN [id number]  Provider location: [facility] Type of Visit: Established patient   PCP:  [name], DO  Cardiologist:  Dr. [name]   History of Present Illness: Kelly Donaldson is a 65 y.o. female who has a history of chronic hypoxemic respiratory failure and pulmonary hypertension.  She has been followed by Dr Melvyn Novas, now follows with [name] with pulmonology.  Has been on home oxygen for several years now.  She quit smoking in 2016.  She had an echo done in 5/16 showing dilated RV with severe pulmonary hypertension.  She therefore had RHC in 5/16 which confirmed severe pulmonary hypertension and RV failure.  PFTs showed only mild obstruction and restriction with markedly decreased DLCO suggestive of pulmonary vascular disease.  There was concern for sarcoidosis on CT chest but lymph node biopsy showed no evidence for sarcoidosis.  V/Q scan showed no evidence for chronic PE.   She has not tolerated Revatio or Adcirca.  Have failed previous up-titration of Selexipag with pelvic pain, can tolerate 200 mcg bid.  She is now tolerating riociguat 2 mg tid.   Started torsemide in Oct. 2017, has had better diuresis.   Due to ongoing dyspnea, she had repeat RHC in 5/19 that showed normal filling pressures and well-controlled pulmonary hypertension.  Echo in 6/19 showed normal LV EF, normal RV systolic function, and mild RV dilation.   She now is on Anoro, feels like this has helped her breathing.    Echo in 9/20 showed EF 55-60%, mild RV dilation with normal systolic function, PASP 26 mmHg, IVC normal.   She has been diagnosed with rheumatoid arthritis, which may explain her ILD.   Echo in 11/21 showed EF 55-60% with normal RV, PASP 30, IVC normal.   She returns for followup of pulmonary HTN.  She is on 2L home oxygen chronically.  Weight is up 6 lbs.  No dyspnea walking on flat ground.  She is short of  breath with stairs, also gets some lightheadedness when climbing stairs. No chest pain.  No orthopnea/PND.   6 minute walk (5/16): 238 meters => decreased oxygen saturation to 73%, increased to 6 L [place] 6 minute walk (7/16): 170 meters => unable to complete.  6 minute walk (9/16): 201 meters 6 minute walk (1/17): 256 meters 6 minute walk (4/17): 219 meters 6 minute walk (8/17): 225 meters 6 minute walk (11/17): 244 meters 6 minute walk (5/18): 229 meters 6 minute walk (2/19): 122 meters (but she was stopped halfway through with drop in oxygen saturation).  6 minute walk (12/20): 304 meters 6 minute walk (8/21): 476 m 6 minute walk (3/22): 213 meters  Labs (2/16): BNP 69 Labs (5/16): LFTs normal, HCT 34.9, RF negative, HIV negative, TSH negative Labs (6/16): K 4.4, creatinine 0.88 Labs (7/16): ANA negative, BNP 58, K 4.3, creatinine 1.1, anti-RNP negative Labs (9/16): K 4.4, creatinine 1.26, BNP 91 Labs (10/16): K 4.3, creatinine 1.04 Labs (12/16): K 4.2, creatinine 1.07, BNP 130 Labs (1/17): K 4.3, creatinine 1.23, BNP 42 Labs (3/17): K 4.2, creatinine 1.19 Labs (5/17): K 4.3, creatinine 1.35, BNP 44 Labs (6/17): K 4.4, creatinine 1.18, BNP 44 Labs (10/17): K 4.2, creatinine 0.98, BNP 43 Labs (12/17): K 4.5, creatinine 1.23. BNP 64.5 Labs (2/18): K 4.5, creatinine 1.21, BNP 64 Labs (4/18): K 4.1, creatinine 1.24, BNP 26 Labs (9/18): K 4.6, creatinine 1.25 Labs (2/19):  K 4.2, creatinine 1.39 Labs (5/19): K 4, creatinine 1.4, hgb 8.4 Labs (12/19): K 4.3, creatinine 1.15 Labs (6/20): TSH normal, K 4, creatinine 1.04, LDL 78 Labs (12/20): LDL 99, K 4.3, creatinine 1.0 Labs (4/21): BNP 36, K 4.1, creatinine 0.96 Labs (8/21): BNP 48, K 4.1, creatinine 0.88 Labs (2/22): hgb 12.2, BNP 22, K 4.1, creatinine 1.03, LDL 129 . PMH: 1. Pulmonary hypertension: RHC (5/16) with mean RA 18, PA 83/33 mean 53, mean PCWP 19, CI 2.61, PVR 5.8 WU.   Echo (5/16) with EF 65-70%, septal flattening,  dilated RV with PA systolic pressure 99 mmHg, +bubble study.  PFTs (4/16) with FVC 76%, FEV1 75%, ratio 97%, TLC 66%, DLCO 26% => mild restriction, mild obstruction, severe diffuse abnormality (pulmonary vascular problem).  CTA chest (4/16) with chronic interstitial and obstructive lung disease, small mediastinal and hilar lymph noes, no PE.  Lymph node biopsy negative for sarcoidosis (reactive changes).  V/Q scan (6/16): No acute or chronic PE. TEE (6/16): Normal LV size and systolic function, EF 72-53%,GU was mildly dilated with mildly decreased systolic function, D-shaped interventricular septum suggestive of RV pressure/volume overload, there appeared to be a small PFO present with some bubbles crossing, no ASD.  ANA negative, anti-RNP negative, HIV negative. Sleep study (8/16) without OSA.  She did not tolerate Adcirca or Revatio.  She cannot tolerate more than 200 mcg bid Selexipag.  - Echo (6/17): EF 44-03%, normal diastolic function, D-shaped interventricular septum, mildly dilated RV with normal RV systolic function, PASP 39, IVC normal.  - Echo (6/18): EF 47-42%, normal diastolic function, D-shaped interventricular septum, mildly dilated RV with normal RV systolic function, PASP 65 mmHg.  - RHC (7/18): RA 13, PA 51/23 mean 36, PCWP 19, CI 4.74, PVR 1.6 WU - RHC (5/19): mean RA 8, PA 48/20 mean 30, mean PCWP 13, CI 5.06, PVR 1.58 WU.  - Echo (6/19): EF 60-65%, mild RV dilation with normal RV systolic function, PASP 60, possible PFO.  - Echo (9/20): EF 55-60%, mild RV dilation with normal systolic function, PASP 26 mmHg, IVC normal.  - Echo (11/21): EF 55-60%, normal RV, PASP 30 mmHg, IVC normal.  2. Chronic venous insufficiency. 3. Hyperlipidemia.  4. GERD 5. Chronic hypoxemic respiratory failure: Hypoxemia, on home oxygen.  Seen by Dr Lake Bells, has emphysema + interstitial lung disease (?UIP => suspect related to RA).  - PFTs (7/19): FEV1 82%, FVC 80%, ratio 101%, TLC 73%, DLCO 23%.  6. Fe  deficiency anemia: EGD/colonoscopy at [facility] normal in 2020.  7. Rheumatoid arthritis: Azathioprine.   Current Outpatient Medications  Medication Sig Dispense Refill  . Accu-Chek Softclix Lancets lancets Check glucose BID Dx R73.03 200 each 3  . albuterol (VENTOLIN HFA) 108 (90 Base) MCG/ACT inhaler Inhale 2 puffs into the lungs every 4 (four) hours as needed for wheezing or shortness of breath.    . Alcohol Swabs (B-D SINGLE USE SWABS REGULAR) PADS Check glucose BID Dx R73.03 200 each 3  . aspirin EC 81 MG tablet Take 1 tablet (81 mg total) by mouth daily. 90 tablet 3  . azaTHIOprine (IMURAN) 50 MG tablet     . Blood Glucose Monitoring Suppl (ACCU-CHEK AVIVA PLUS) w/Device KIT Check glucose BID Dx R73.03 1 kit 0  . citalopram (CELEXA) 10 MG tablet Take 1 tablet (10 mg total) by mouth daily. 90 tablet 2  . ferrous sulfate (FERROUSUL) 325 (65 FE) MG tablet Take 1 tablet (325 mg total) by mouth daily with breakfast. 90 tablet 3  .  fluticasone (FLONASE) 50 MCG/ACT nasal spray Place 1 spray into both nostrils daily. 48 g 3  . glucose blood (ACCU-CHEK AVIVA PLUS) test strip Check glucose BID Dx R73.03 200 each 3  . HYDROcodone-acetaminophen (NORCO) 10-325 MG tablet     . ipratropium-albuterol (DUONEB) 0.5-2.5 (3) MG/3ML SOLN INHALE THE CONTENTS OF 1 VIAL VIA NEBULIZER FOUR TIMES DAILY AS NEEDED 1080 mL 3  . Lancets Misc. (ACCU-CHEK SOFTCLIX LANCET DEV) KIT Check glucose BID Dx R73.03 1 kit 0  . loratadine (CLARITIN) 10 MG tablet Take 1 tablet (10 mg total) by mouth daily. 30 tablet 11  . OXYGEN Take 2.5-4 L by mouth See admin instructions. 2.5 L with resting state & 4 L with exertion [facility]    . Polyethyl Glycol-Propyl Glycol 0.4-0.3 % SOLN Place 1 drop into both eyes daily.    . potassium chloride SA (KLOR-CON) 20 MEQ tablet TAKE 1 TABLET TWICE DAILY 180 tablet 0  . pravastatin (PRAVACHOL) 20 MG tablet Take 1 tablet (20 mg total) by mouth daily. 90 tablet 0  . Tiotropium Bromide-Olodaterol  (STIOLTO RESPIMAT) 2.5-2.5 MCG/ACT AERS Inhale 2 puffs into the lungs daily. 12 g 3  . Vitamin D, Ergocalciferol, (DRISDOL) 1.25 MG (50000 UT) CAPS capsule TAKE 1 CAPSULE EVERY MONDAY 12 capsule 3  . zolpidem (AMBIEN) 5 MG tablet Take 1 tablet (5 mg total) by mouth at bedtime as needed for sleep (please allow to fill every 30 days). 30 tablet 3  . ADEMPAS 2 MG TABS Take 2 mg by mouth 3 (three) times daily. Dose changes may occur throughout the course of therapy as directed by your prescriber. 270 tablet 3  . macitentan (OPSUMIT) 10 MG tablet Take 1 tablet (10 mg total) by mouth daily. 90 tablet 3  . torsemide (DEMADEX) 20 MG tablet Take 2 tablets (40 mg total) by mouth 2 (two) times daily. 360 tablet 3  . UPTRAVI 200 MCG TABS Take 1 tablet (200 mcg total) by mouth 2 (two) times daily. 60 tablet 11   No current facility-administered medications for this encounter.    Allergies:   Gabapentin and Latex   Social History:  The patient  reports that she quit smoking about 5 years ago. Her smoking use included cigarettes. She has a 4.25 pack-year smoking history. She has never used smokeless tobacco. She reports current drug use. Drug: Cocaine. She reports that she does not drink alcohol.   Family History:  The patient's family history includes Asthma in her sister; Breast cancer in her maternal grandmother; Clotting disorder in her daughter; Diabetes in her mother; Emphysema in her father; Hyperlipidemia in her father and mother; Hypertension in her daughter, father, and mother; Other in her mother; Ovarian cancer in her maternal aunt.   ROS:  Please see the history of present illness.   All other systems are personally reviewed and negative.   Exam:   BP 104/60   Pulse 71   Wt 98.4 kg (217 lb)   SpO2 92% Comment: 2 liters n/c  BMI 33.99 kg/m  General: NAD Neck: JVP 8 cm, no thyromegaly or thyroid nodule.  Lungs: Clear to auscultation bilaterally with normal respiratory effort. CV:  Nondisplaced PMI.  Heart regular S1/S2, no S3/S4, no murmur.  No peripheral edema.  No carotid bruit.  Normal pedal pulses.  Abdomen: Soft, nontender, no hepatosplenomegaly, no distention.  Skin: Intact without lesions or rashes.  Neurologic: Alert and oriented x 3.  Psych: Normal affect. Extremities: No clubbing or cyanosis.  HEENT: Normal.  Recent Labs: 01/22/2021: ALT 6; Hemoglobin 12.2; Platelets 219 02/13/2021: B Natriuretic Peptide 35.3; BUN 10; Creatinine, Ser 0.89; Potassium 4.0; Sodium 136  Personally reviewed   Wt Readings from Last 3 Encounters:  02/13/21 98.4 kg (217 lb)  01/22/21 97.5 kg (215 lb)  10/20/20 95.8 kg (211 lb 3.2 oz)      ASSESSMENT AND PLAN:  1. Pulmonary arterial hypertension with RV failure: Severe on 5/16 RHC. 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  There was concern from CTA chest for sarcoidosis, but lymph node biopsy was negative.  ILD is likely related to rheumatoid arthritis. LFTs normal, ANA negative, anti-RNP negative, HIV negative.  V/Q scan negative for chronic PE.  Small PFO but no ASD on TEE. No OSA on sleep study.  She did not tolerate Revatio due to joint pain (now resolved), and she did not tolerate Adcirca with worsening dyspnea. She does not tolerate more than 200 mcg bid Selexipag due to pelvic pain.   RHCs in 7/18 and 5/19 have shown well-compensated pulmonary hypertension.  PVR was 1.58 WU on 5/19 study. Echo in 11/21 showed EF 55-60% with normal RV, PASP 30, normal IVC.   I think that a lot of her residual dyspnea is due to COPD + ILD rather than pulmonary hypertension.  Stable symptomatically though 6 minute walk was less today.  Suspect mild volume overload on exam, weight is up.  - Continue oxygen.  - Open lung biopsy deemed too risky, probably not sarcoid per pulmonary evaluation => suspect ILD related to RA.    - Continue opsumit and riociguat 2 mg  tid (unable to go higher due to dizziness).   - She has tolerated selexipag 200 mcg bid but has not been able to increase.  - Check BNP today.  2. Chronic hypoxemic respiratory failure: Chronic interstitial lung disease + emphysema.  Concern for sarcoidosis but biopsy did not show sarcoid.  Deemed too high risk for open lung biopsy.  Dr. 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  She has been diagnosed with RA, this may be the source of her ILD.  - Continue pulmonary followup, needs to make appt with [name].  3. Chronic diastolic CHF: Echo 81/01 with EF 55-60%, normal RV. NYHA II. She is mildly volume on exam.  - Increase torsemide to 60 qam/40 qpm x 3 days then back to 40 mg bid.  BMET today.     4. Hyperlipidemia: Lipids acceptable in 2/22.  5. Rheumatoid arthritis: On azathioprine, follows with Dr. Domingo Sep.   Followup 3 months.   Signed, Loralie Champagne, MD  02/13/2021  [place] [address] [facility] [place] [address] [phone] (office) [phone] (fax)

## 2021-02-14 ENCOUNTER — Telehealth (HOSPITAL_COMMUNITY): Payer: Self-pay | Admitting: Pharmacist

## 2021-02-14 DIAGNOSIS — I270 Primary pulmonary hypertension: Secondary | ICD-10-CM

## 2021-02-14 MED ORDER — OPSUMIT 10 MG PO TABS: 10.0000 mg | ORAL_TABLET | Freq: Every day | ORAL | 3 refills | Status: DC

## 2021-02-14 NOTE — Telephone Encounter (Signed)
Opsumit prescription sent to [facility].   Karle Plumber, PharmD, BCPS, BCCP, CPP [facility] Pharmacist [phone]

## 2021-02-20 ENCOUNTER — Other Ambulatory Visit (HOSPITAL_COMMUNITY): Payer: Self-pay | Admitting: Cardiology

## 2021-02-20 DIAGNOSIS — I270 Primary pulmonary hypertension: Secondary | ICD-10-CM

## 2021-04-13 ENCOUNTER — Ambulatory Visit: Payer: Medicare PPO | Admitting: Pulmonary Disease

## 2021-04-13 ENCOUNTER — Encounter: Payer: Self-pay | Admitting: Pulmonary Disease

## 2021-04-13 ENCOUNTER — Other Ambulatory Visit: Payer: Self-pay

## 2021-04-13 VITALS — BP 116/74 | HR 89 | Temp 98.4°F | Ht 67.0 in | Wt 208.6 lb

## 2021-04-13 DIAGNOSIS — J309 Allergic rhinitis, unspecified: Secondary | ICD-10-CM | POA: Diagnosis not present

## 2021-04-13 DIAGNOSIS — J849 Interstitial pulmonary disease, unspecified: Secondary | ICD-10-CM

## 2021-04-13 DIAGNOSIS — R0600 Dyspnea, unspecified: Secondary | ICD-10-CM | POA: Diagnosis not present

## 2021-04-13 DIAGNOSIS — R0609 Other forms of dyspnea: Secondary | ICD-10-CM

## 2021-04-13 DIAGNOSIS — L03113 Cellulitis of right upper limb: Secondary | ICD-10-CM

## 2021-04-13 MED ORDER — DOXYCYCLINE HYCLATE 100 MG PO TABS: 100.0000 mg | ORAL_TABLET | Freq: Two times a day (BID) | ORAL | 0 refills | Status: DC

## 2021-04-13 MED ORDER — FLUTICASONE PROPIONATE 50 MCG/ACT NA SUSP: 1.0000 | Freq: Every day | NASAL | 3 refills | Status: DC

## 2021-04-13 MED ORDER — MONTELUKAST SODIUM 10 MG PO TABS: 10.0000 mg | ORAL_TABLET | Freq: Every day | ORAL | 11 refills | Status: AC

## 2021-04-13 MED ORDER — BREZTRI AEROSPHERE 160-9-4.8 MCG/ACT IN AERO: 2.0000 | INHALATION_SPRAY | Freq: Two times a day (BID) | RESPIRATORY_TRACT | 0 refills | Status: DC

## 2021-04-13 NOTE — Patient Instructions (Addendum)
Nice to meet you  It sounds like some of your worsening shortness of breath is related to the pollen, allergy symptoms.  To see if I can improve this, use Flonase 1 spray each nostril twice a day for 3 days then back down to 1 spray each nostril daily.  In addition, I prescribed a new medicine, montelukast.  Take 1 pill at night.  For the breathing, stop the Stiolto for now.  Use the Breztri samples provided, 2 puffs twice a day.  If this helps significantly, let me know and I will prescribe this for you.  If the [name] is too expensive, please call your insurance company and ask if there is a preferred option or if there is no other preferred option, ask what is the preferred inhaled corticosteroid.  Then call us and let us know and I can prescribe what is best.  At next visit, we will get PFTs or breathing test and discussed repeating a CT scan to check on the scarring on the lung.  Lastly, I recommend you see your primary care provider today or if you cannot see them today go to a local urgent care for evaluation of the swelling on your right arm.  This looks like infection, and abscess to me.  I prescribed doxycycline 1 pill twice a day for 1 week to help treat the infection.  However, the infection will not be properly treated until the pus can be removed.  The urgent care your primary doctor should be able to do this.  Return to clinic in 3 months or sooner as needed for follow-up

## 2021-04-17 ENCOUNTER — Ambulatory Visit (INDEPENDENT_AMBULATORY_CARE_PROVIDER_SITE_OTHER): Payer: Medicare PPO

## 2021-04-17 ENCOUNTER — Other Ambulatory Visit: Payer: Self-pay

## 2021-04-17 DIAGNOSIS — Z23 Encounter for immunization: Secondary | ICD-10-CM

## 2021-04-17 NOTE — Progress Notes (Signed)
_0  ID: Kelly Donaldson, female    DOB: [birth year], 65 y.o.   MRN: [id number]  Chief Complaint  Patient presents with  . Establish Care    Former BQ patient for [facility]. States her breathing was stable until a few weeks ago. Increased SOB with exertion. Currently on 3L of O2.     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  Formally patient of [name] and more recently [name].  Most recent pulmonary note x2 reviewed.  Most recent cardiology note who manages pulmonary hypertension medications reviewed.  Overall, patient reports worsening dyspnea over the last several weeks.  She associates it with worsening sinus or nasal congestion, rhinorrhea.  Symptoms undoubtably worsen start of the pollen.  Describes chest tightness.  No worsening oxygenation.  When asked to describe her symptoms more she relates more to difficulty getting air through her nasal passages as opposed to true dyspnea.  She continues on azathioprine and appears 100 mg daily (50 twice daily) per her rheumatologist for her joint symptoms and RA.  She continues on Opsumit and Adempas at stable dosing.  Lastly, patient notes swelling nodule on right arm.  It is warm, painful to touch.  Notes it was red and warm earlier in the week and then got scratched or scab removed by husband.  Since then the swelling underneath the skin is started.  She denies any fever or chills.     Questionaires / Pulmonary Flowsheets:   ACT:  No flowsheet data found.  MMRC: mMRC Dyspnea Scale mMRC Score  04/13/2021 2    Epworth:  No flowsheet data found.  Tests:   FENO:  No results found for: NITRICOXIDE  PFT: PFT Results Latest Ref Rng & Units 06/18/2018 03/20/2015  FVC-Pre L 2.37 2.33  FVC-Predicted Pre % 80 76  FVC-Post L - 2.32  FVC-Predicted Post % - 76  Pre FEV1/FVC % % 80 78  Post FEV1/FCV % % - 77   FEV1-Pre L 1.90 1.81  FEV1-Predicted Pre % 82 75  FEV1-Post L - 1.79  DLCO uncorrected ml/min/mmHg 6.52 7.39  DLCO UNC% % 23 26  DLCO corrected ml/min/mmHg 8.12 -  DLCO COR %Predicted % 28 -  DLVA Predicted % 43 35  TLC L 4.07 3.63  TLC % Predicted % 73 66  RV % Predicted % 81 38  Personally reviewed and interpreted as mild restriction with severely reduced DLCO consistent with concomitant ILD and pulmonary hypertension  WALK:  SIX MIN WALK 07/11/2020 11/22/2019 06/18/2018 11/26/2016 05/09/2015 03/20/2015 01/11/2015  Medications aspirin 52m, hydrocodone-acetaminophen 10-3257m opsumit 1046mpotassium chloride 61m34mdempas 2mg,24mlexipag 200mcg2mrsemide 61mg, 50mn at 7:30am - - ASA 81mg, C58ma 40mg, Vi50m50000iu, Norco 10-325mg, Mac70man 10mg, klor64m 61meq, Rioc16mt 1.5mg, Selexip37m200mcg, Demede70mmg @ 0600 - 53m Supplimental Oxygen during Test? (L/min) _1  Yes Yes  O2 Flow Rate _2 Type Continuous Continuous Continuous Pulse Pulse Continuous Continuous  Laps 14 - - 5 - - -  Partial Lap (in Meters) 0 - - 15 - - -  Baseline BP (sitting) 112/70 - - 104/62 - - -  Baseline Heartrate 68 73 - 73 - - -  Baseline Dyspnea (Borg Scale) 0 - - 0.5 - - -  Baseline Fatigue (Borg Scale) 0 - - 0 - - -  Baseline SPO2 96 94 - 100 - - -  BP (sitting) 120/80 - - 124/58 - - -  Heartrate 85 100 - 92 - - -  Dyspnea (Borg Scale) 2 - - 4 - - -  Fatigue (Borg Scale) 2 - - 4 - - -  SPO2 84 54 - 87 - - -  BP (sitting) 114/76 - - 114/64 - - -  Heartrate 72 72 - 86 - - -  SPO2 97 94 - 93 - - -  Stopped or Paused before Six Minutes Yes Yes - Yes - - -  Other Symptoms at end of Exercise Pt stopped with 50 sec remaining due to just "being done." Pt also stated that she felt like her knee was popping out of joint. tired - pt stopped at 3:33secs d/t fatigue and dyspnea, resumed 3:23secs.  stopped at 2:52sec d/t fatigue and dyspnea (Dyspnea Borg = 10 : sat = 86% 4lpm pulsed),  increased O2 to 5lpm pulsed, sats recovered to 95%, resumed test at 1:02secs - - -  Interpretation Leg pain - - - - - -  Distance Completed 476 - - 255 - - -  Distance Completed 0 - - - - - -  Tech Comments: Pt walked at a normal pace, denied any complaints during walk. stopped with 50 seconds remaining due to being done with the walk. 304.76mters pt started walking on 3L but needed 6L to maintain at 91%, we stopped when O2 reached 75% TA/CMA - after sat dropped to 88%4lpm pulsed--increased o2 to 5lpm pulsed and pt walked another lap with sats ending at 79%5lpm pulsed/normal pace/no SOB//lmr After lap 1 sats= 87%2lpm--increased o2 to 4 lpm and walked another lap and sats = 89%4lpm cont/pt c/o minimal SOB/walked normal pace//lmr normal pace/stopped due to increased SOB//lmr    Imaging: Personally reviewed CT high-resolution chest most recent 06/2018 demonstrates upper lobe predominant emphysema as well as what appears to be NSIP pattern ILD with traction bronchiectasis in bilateral lower lobes on my interpretation  Lab Results: Personally reviewed, notably eosinophils as high as 700 CBC    Component Value Date/Time   WBC 6.6 01/22/2021 0947   WBC 7.0 05/06/2018 0948   RBC 4.18 01/22/2021 0947   RBC 4.02 05/06/2018 0948   HGB 12.2 01/22/2021 0947   HCT 37.4 01/22/2021 0947   PLT 219 01/22/2021 0947   MCV 90 01/22/2021 0947   MCH 29.2 01/22/2021 0947   MCH 20.9 (L) 05/06/2018 0948   MCHC 32.6 01/22/2021 0947   MCHC 28.9 (L) 05/06/2018 0948   RDW 13.0 01/22/2021 0947   LYMPHSABS 2.0 11/11/2019 0939   MONOABS 0.4 03/30/2015 1610   EOSABS 0.7 (H) 11/11/2019 0939   BASOSABS 0.1 11/11/2019 0939    BMET    Component Value Date/Time   NA 136 02/13/2021 1256   NA 143 01/22/2021 0947   K 4.0 02/13/2021 1256   CL 100 02/13/2021 1256   CO2 28 02/13/2021 1256   GLUCOSE 93 02/13/2021 1256   BUN 10 02/13/2021 1256   BUN 11 01/22/2021 0947   CREATININE 0.89 02/13/2021 1256   CALCIUM 9.0  02/13/2021 1256   GFRNONAA >60 02/13/2021 1256   GFRAA 66 01/22/2021 0947    BNP    Component Value Date/Time   BNP 35.3 02/13/2021 1256    ProBNP    Component Value Date/Time   PROBNP 69.0 01/11/2015 1630    Specialty Problems      Pulmonary Problems   Chronic respiratory  failure with hypoxia ([place])    Followed in Pulmonary clinic/ [facility]/ [facility]  On 02 since 2013 (originally Koleen Nimrod) - pulmonary eval [place] last seen 12/17/12 with "neg CTa for PE, pfts ok except dlco 20%, echo c/w  PH, 02 dep hypoxemia > rec RHC and stop phenterimine" -Echo 09/15/14     LAE mildly dilated, as are RV and RA  - 01/11/2015   Walked 2lpm  x one lap @ 185 stopped due to  Sob, nl pace, no desat - Quit smoking 03/02/15  - PFTs 03/20/2015  Nl except for DLCO 26 corrects to 35%  - 03/20/2015   Walked 2lpm x one lap @ 185 stopped due to sob/ desat corrected on 4lpm  - 04/07/2015 rec  referral to Dr Marigene Ehlers for Right heart Cath - Repeat Echo with bubble study 04/19/2015 > With injection of agitated saline intravenously there is appearance of bubbles in L sided chambers 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 - 05/09/2015   Walked 4lpm x one lap @ 185 stopped due to  88% one lap > 05/11/15 Pos small PFO - 07/11/2020 desat during 6 mw on 3lpm cont   As of 07/11/2020  = 2lpm hs and rest/ up to 5lpm POC           ILD (interstitial lung disease) ([place])    08/2016 high-resolution CT scan of the chest: [facility] review: Peripheral based interlobular septal thickening and groundglass consistent with fibrosis with some mild bronchiectasis, fibrotic changes appear to be more prominent in the lower lobes with upper lobe nodules, no air trapping noted on expiratory images, mediastinal lymphadenopathy stable per radiology  Chest HRCT 07/07/18 . CT pattern is considered indeterminate for usual interstitial pneumonia (UIP). 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 prior to study with DLCO  6.5 (23%) corrects to 2.23 (43%)  for alv volume and FV curve mild/mod curvature in effort indep portion of f/v > started on anoro and helped doe  CT chest 07/07/18 mild centrilobular and paraseptal emphysema. - 02/21/2020  After extensive coaching inhaler device,  effectiveness =    90% with smi > changed to stiolto per insurance req           Allergies  Allergen Reactions  . Gabapentin Other (See Comments)    Pt states that she can not take with antidepressants  . Latex Rash    Immunization History  Administered Date(s) Administered  . Influenza Split 09/16/2014, 09/20/2015, 09/08/2017, 10/04/2017  . Influenza,inj,Quad PF,6+ Mos 09/04/2016, 11/12/2018, 08/17/2019, 09/07/2020  . Influenza-Unspecified 10/13/2017  . Moderna Sars-Covid-2 Vaccination 02/21/2020, 03/20/2020, 12/16/2020  . Pneumococcal Polysaccharide-23 11/11/2019  . Zoster Recombinat (Shingrix) 09/20/2020, 04/17/2021    Past Medical History:  Diagnosis Date  . Allergy   . Depression   . GERD (gastroesophageal reflux disease)   . Hyperlipidemia   . Oxygen dependent   . Shortness of breath dyspnea   . Varicose veins     Tobacco History: Social History   Tobacco Use  Smoking Status Former Smoker  . Packs/day: 0.25  . Years: 17.00  .  Pack years: 4.25  . Types: Cigarettes  . Quit date: 03/02/2015  . Years since quitting: 6.1  Smokeless Tobacco Never Used   Counseling given: Not Answered   Continue to not smoke  Outpatient Encounter Medications as of 04/13/2021  Medication Sig  . Accu-Chek Softclix Lancets lancets Check glucose BID Dx R73.03  . ADEMPAS 2 MG TABS  Take 2 mg by mouth 3 (three) times daily. Dose changes may occur throughout the course of therapy as directed by your prescriber.  Marland Kitchen albuterol (VENTOLIN HFA) 108 (90 Base) MCG/ACT inhaler Inhale 2 puffs into the lungs every 4 (four) hours as needed for wheezing or shortness of breath.  . Alcohol Swabs (B-D SINGLE USE SWABS REGULAR) PADS Check glucose BID Dx R73.03  . aspirin EC 81 MG tablet Take 1 tablet (81 mg total) by mouth daily.  Marland Kitchen azaTHIOprine (IMURAN) 50 MG tablet   . Blood Glucose Monitoring Suppl (ACCU-CHEK AVIVA PLUS) w/Device KIT Check glucose BID Dx R73.03  . Budeson-Glycopyrrol-Formoterol (BREZTRI AEROSPHERE) 160-9-4.8 MCG/ACT AERO Inhale 2 puffs into the lungs in the morning and at bedtime.  . citalopram (CELEXA) 10 MG tablet Take 1 tablet (10 mg total) by mouth daily.  Marland Kitchen doxycycline (VIBRA-TABS) 100 MG tablet Take 1 tablet (100 mg total) by mouth 2 (two) times daily.  . ferrous sulfate (FERROUSUL) 325 (65 FE) MG tablet Take 1 tablet (325 mg total) by mouth daily with breakfast.  . glucose blood (ACCU-CHEK AVIVA PLUS) test strip Check glucose BID Dx R73.03  . HYDROcodone-acetaminophen (NORCO) 10-325 MG tablet   . ipratropium-albuterol (DUONEB) 0.5-2.5 (3) MG/3ML SOLN INHALE THE CONTENTS OF 1 VIAL VIA NEBULIZER FOUR TIMES DAILY AS NEEDED  . Lancets Misc. (ACCU-CHEK SOFTCLIX LANCET DEV) KIT Check glucose BID Dx R73.03  . loratadine (CLARITIN) 10 MG tablet Take 1 tablet (10 mg total) by mouth daily.  . macitentan (OPSUMIT) 10 MG tablet Take 1 tablet (10 mg total) by mouth daily.  . montelukast (SINGULAIR) 10 MG tablet Take 1 tablet (10 mg total) by mouth at bedtime.  . OXYGEN Take 2.5-4 L by mouth See admin instructions. 2.5 L with resting state & 4 L with exertion [employer]  . Polyethyl Glycol-Propyl Glycol 0.4-0.3 % SOLN Place 1 drop into both eyes daily.  . potassium chloride SA (KLOR-CON) 20 MEQ tablet TAKE 1 TABLET TWICE DAILY  . pravastatin (PRAVACHOL) 20 MG tablet Take 1  tablet (20 mg total) by mouth daily.  . Tiotropium Bromide-Olodaterol (STIOLTO RESPIMAT) 2.5-2.5 MCG/ACT AERS Inhale 2 puffs into the lungs daily.  Marland Kitchen torsemide (DEMADEX) 20 MG tablet Take 2 tablets (40 mg total) by mouth 2 (two) times daily.  Marland Kitchen UPTRAVI 200 MCG TABS Take 1 tablet twice daily.  . Vitamin D, Ergocalciferol, (DRISDOL) 1.25 MG (50000 UT) CAPS capsule TAKE 1 CAPSULE EVERY MONDAY  . zolpidem (AMBIEN) 5 MG tablet Take 1 tablet (5 mg total) by mouth at bedtime as needed for sleep (please allow to fill every 30 days).  . [DISCONTINUED] fluticasone (FLONASE) 50 MCG/ACT nasal spray Place 1 spray into both nostrils daily.  . fluticasone (FLONASE) 50 MCG/ACT nasal spray Place 1 spray into both nostrils daily.   No facility-administered encounter medications on file as of 04/13/2021.     Review of Systems  Review of Systems  No chest pain with exertion.  No orthopnea or PND.  Conference review of systems otherwise negative. Physical Exam  BP 116/74   Pulse 89   Temp 98.4 F (36.9 C) (Temporal)  Ht _0  (1.702 m)   Wt 208 lb 9.6 oz (94.6 kg)   SpO2 94% Comment: on 3L  BMI 32.67 kg/m   Wt Readings from Last 5 Encounters:  04/13/21 208 lb 9.6 oz (94.6 kg)  02/13/21 217 lb (98.4 kg)  01/22/21 215 lb (97.5 kg)  10/20/20 211 lb 3.2 oz (95.8 kg)  09/20/20 211 lb (95.7 kg)    BMI Readings from Last 5 Encounters:  04/13/21 32.67 kg/m  02/13/21 33.99 kg/m  01/22/21 33.67 kg/m  10/20/20 33.08 kg/m  09/20/20 33.05 kg/m     Physical Exam General: Well-appearing, no acute distress Eyes: EOMI, no icterus Neck: Supple no JVP Cardiovascular: Regular rate and rhythm, no murmur Pulmonary: Faint crackles in bilateral bases, otherwise clear to auscultation, normal work of breathing on nasal cannula Abdomen: Nondistended, bowel sounds present MSK: No synovitis, joint effusion Skin: Raised area of fluctuance right forearm with appears to be small scab on the lateral border with  overlying erythema that is very warm to touch, tender to palpation Neuro: Normal gait, no weakness Psych: Normal mood, full affect   Assessment & Plan:   Dyspnea on exertion: In the setting of nasal congestion and pollen.  Concern for underlying asthma.  Recommend escalation of inhaler therapy to triple therapy via Breztri.  PFTs at next visit.  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  On Imuran appears to be 100 mg daily, she is unsure but this is per our medication review via EMR.  Does not sound like dyspnea is related to worsening ILD.  Screen with PFTs at next visit, consider repeating CT scan pending results.  Pulmonary hypertension: Likely multifactorial, group 1 given connective tissue disease, group 3 given ILD.  Continue medicines per Dr. Marigene Ehlers and cardiology.  Seasonal allergies: Prescribed Flonase, montelukast.  To continue other allergy medications.  Cellulitis with concern for underlying abscess: Right forearm with area of fluctuance, raised, indurated with overlying erythema and warmth extending both cranially and caudally and small area.  Given concern of abscess and purulent infection, doxycycline prescribed to cover both strep and MRSA.  Advised her to present immediately to her PCP or if cannot be seen today to present to urgent care or ED locally for evaluation and likely incision and drainage.   Return in about 3 months (around 07/14/2021).   Lanier Clam, MD 04/17/2021   This appointment required 65 minutes of patient care (this includes precharting, chart review, review of results, face-to-face care, etc.).

## 2021-05-10 ENCOUNTER — Other Ambulatory Visit: Payer: Self-pay | Admitting: Family Medicine

## 2021-05-10 DIAGNOSIS — Z1231 Encounter for screening mammogram for malignant neoplasm of breast: Secondary | ICD-10-CM

## 2021-05-17 ENCOUNTER — Other Ambulatory Visit: Payer: Self-pay

## 2021-05-17 ENCOUNTER — Ambulatory Visit
Admission: RE | Admit: 2021-05-17 | Discharge: 2021-05-17 | Disposition: A | Discharge status: Home / Self Care | Payer: Medicare PPO | Source: Ambulatory Visit | Attending: Family Medicine | Admitting: Family Medicine

## 2021-05-17 DIAGNOSIS — Z1231 Encounter for screening mammogram for malignant neoplasm of breast: Secondary | ICD-10-CM

## 2021-05-22 ENCOUNTER — Ambulatory Visit (HOSPITAL_COMMUNITY)
Admission: RE | Admit: 2021-05-22 | Discharge: 2021-05-22 | Disposition: A | Discharge status: Home / Self Care | Payer: Medicare PPO | Source: Ambulatory Visit | Attending: Cardiology | Admitting: Cardiology

## 2021-05-22 ENCOUNTER — Other Ambulatory Visit: Payer: Self-pay

## 2021-05-22 ENCOUNTER — Ambulatory Visit (INDEPENDENT_AMBULATORY_CARE_PROVIDER_SITE_OTHER): Payer: Medicare PPO | Admitting: Family Medicine

## 2021-05-22 ENCOUNTER — Encounter: Payer: Self-pay | Admitting: Family Medicine

## 2021-05-22 ENCOUNTER — Encounter (HOSPITAL_COMMUNITY): Payer: Self-pay | Admitting: Cardiology

## 2021-05-22 VITALS — BP 92/62 | HR 68 | Temp 97.4°F | Ht 67.0 in | Wt 208.6 lb

## 2021-05-22 VITALS — BP 104/60 | HR 70 | Wt 212.0 lb

## 2021-05-22 DIAGNOSIS — Z87891 Personal history of nicotine dependence: Secondary | ICD-10-CM | POA: Diagnosis not present

## 2021-05-22 DIAGNOSIS — I5032 Chronic diastolic (congestive) heart failure: Secondary | ICD-10-CM | POA: Diagnosis not present

## 2021-05-22 DIAGNOSIS — D869 Sarcoidosis, unspecified: Secondary | ICD-10-CM

## 2021-05-22 DIAGNOSIS — Z79899 Other long term (current) drug therapy: Secondary | ICD-10-CM | POA: Diagnosis not present

## 2021-05-22 DIAGNOSIS — F411 Generalized anxiety disorder: Secondary | ICD-10-CM | POA: Diagnosis not present

## 2021-05-22 DIAGNOSIS — M069 Rheumatoid arthritis, unspecified: Secondary | ICD-10-CM | POA: Diagnosis not present

## 2021-05-22 DIAGNOSIS — I2729 Other secondary pulmonary hypertension: Secondary | ICD-10-CM

## 2021-05-22 DIAGNOSIS — I2721 Secondary pulmonary arterial hypertension: Secondary | ICD-10-CM | POA: Insufficient documentation

## 2021-05-22 DIAGNOSIS — J449 Chronic obstructive pulmonary disease, unspecified: Secondary | ICD-10-CM | POA: Diagnosis not present

## 2021-05-22 DIAGNOSIS — F331 Major depressive disorder, recurrent, moderate: Secondary | ICD-10-CM | POA: Diagnosis not present

## 2021-05-22 DIAGNOSIS — Z9981 Dependence on supplemental oxygen: Secondary | ICD-10-CM | POA: Diagnosis not present

## 2021-05-22 DIAGNOSIS — J9611 Chronic respiratory failure with hypoxia: Secondary | ICD-10-CM | POA: Insufficient documentation

## 2021-05-22 DIAGNOSIS — E785 Hyperlipidemia, unspecified: Secondary | ICD-10-CM | POA: Insufficient documentation

## 2021-05-22 DIAGNOSIS — Z7982 Long term (current) use of aspirin: Secondary | ICD-10-CM | POA: Diagnosis not present

## 2021-05-22 DIAGNOSIS — J8489 Other specified interstitial pulmonary diseases: Secondary | ICD-10-CM | POA: Diagnosis not present

## 2021-05-22 DIAGNOSIS — F5104 Psychophysiologic insomnia: Secondary | ICD-10-CM | POA: Diagnosis not present

## 2021-05-22 DIAGNOSIS — Z91040 Latex allergy status: Secondary | ICD-10-CM | POA: Insufficient documentation

## 2021-05-22 LAB — BRAIN NATRIURETIC PEPTIDE: B Natriuretic Peptide: 20.7 pg/mL (ref 0.0–100.0)

## 2021-05-22 MED ORDER — ZOLPIDEM TARTRATE 5 MG PO TABS: 5.0000 mg | ORAL_TABLET | Freq: Every evening | ORAL | 5 refills | Status: DC | PRN

## 2021-05-22 MED ORDER — CITALOPRAM HYDROBROMIDE 20 MG PO TABS: 20.0000 mg | ORAL_TABLET | Freq: Every day | ORAL | 3 refills | Status: DC

## 2021-05-22 NOTE — Patient Instructions (Signed)
We have increased your Celexa to 20mg  daily. You may take 2 of the 10mg  tablets to equal this if you have a large supply left.  However, make sure only ONE of the 20mg  tablets when you pick up your new bottle.  Taking the medicine as directed and not missing any doses is one of the best things you can do to treat your depression.  Here are some things to keep in mind:  Side effects (stomach upset, some increased anxiety) may happen before you notice a benefit.  These side effects typically go away over time. Changes to your dose of medicine or a change in medication all together is sometimes necessary Most people need to be on medication at least 12 months Many people will notice an improvement within two weeks but the full effect of the medication can take up to 4-6 weeks Stopping the medication when you start feeling better often results in a return of symptoms Never discontinue your medication without contacting a health care professional first.  Some medications require gradual discontinuation/ taper and can make you sick if you stop them abruptly.  If your symptoms worsen or you have thoughts of suicide/homicide, PLEASE SEEK IMMEDIATE MEDICAL ATTENTION.  You may always call:  National Suicide Hotline: 940-654-6948 [place] Crisis Line: 980-391-8732 Crisis Recovery in [place]: 6625959540   These are available 24 hours a day, 7 days a week.

## 2021-05-22 NOTE — Progress Notes (Signed)
Subjective: CC: Insomnia PCP: [name], DO OZD:[id number] L Westman is a 65 y.o. female presenting to clinic today for:  1. Insomnia/depressive disorder, anxiety disorder Patient reports good control of insomnia with Ambien 5 mg nightly.  Denies any excessive daytime sedation, falls, respiratory depression (outside of normal) or sleepwalking.  She does report that anxiety and depression seem to be worse despite use of Celexa 10 mg daily.  In fact over the last month or so she has been intermittently taking 15 mg of this and she does find a difference when she takes the higher dose.  No SI or HI but she finds that symptoms seem to be worse out of the blue.   ROS: Per HPI  Allergies  Allergen Reactions   Gabapentin Other (See Comments)    Pt states that she can not take with antidepressants   Latex Rash   Past Medical History:  Diagnosis Date   Allergy    Depression    GERD (gastroesophageal reflux disease)    Hyperlipidemia    Oxygen dependent    Shortness of breath dyspnea    Varicose veins     Current Outpatient Medications:    Accu-Chek Softclix Lancets lancets, Check glucose BID Dx R73.03, Disp: 200 each, Rfl: 3   ADEMPAS 2 MG TABS, Take 2 mg by mouth 3 (three) times daily. Dose changes may occur throughout the course of therapy as directed by your prescriber., Disp: 270 tablet, Rfl: 3   albuterol (VENTOLIN HFA) 108 (90 Base) MCG/ACT inhaler, Inhale 2 puffs into the lungs every 4 (four) hours as needed for wheezing or shortness of breath., Disp: , Rfl:    Alcohol Swabs (B-D SINGLE USE SWABS REGULAR) PADS, Check glucose BID Dx R73.03, Disp: 200 each, Rfl: 3   aspirin EC 81 MG tablet, Take 1 tablet (81 mg total) by mouth daily., Disp: 90 tablet, Rfl: 3   azaTHIOprine (IMURAN) 50 MG tablet, , Disp: , Rfl:    Blood Glucose Monitoring Suppl (ACCU-CHEK AVIVA PLUS) w/Device KIT, Check glucose BID Dx R73.03, Disp: 1 kit, Rfl: 0   Budeson-Glycopyrrol-Formoterol (BREZTRI  AEROSPHERE) 160-9-4.8 MCG/ACT AERO, Inhale 2 puffs into the lungs in the morning and at bedtime., Disp: 11.8 g, Rfl: 0   citalopram (CELEXA) 10 MG tablet, Take 1 tablet (10 mg total) by mouth daily., Disp: 90 tablet, Rfl: 2   ferrous sulfate (FERROUSUL) 325 (65 FE) MG tablet, Take 1 tablet (325 mg total) by mouth daily with breakfast., Disp: 90 tablet, Rfl: 3   fluticasone (FLONASE) 50 MCG/ACT nasal spray, Place 1 spray into both nostrils daily., Disp: 48 g, Rfl: 3   glucose blood (ACCU-CHEK AVIVA PLUS) test strip, Check glucose BID Dx R73.03, Disp: 200 each, Rfl: 3   HYDROcodone-acetaminophen (NORCO) 10-325 MG tablet, , Disp: , Rfl:    ipratropium-albuterol (DUONEB) 0.5-2.5 (3) MG/3ML SOLN, INHALE THE CONTENTS OF 1 VIAL VIA NEBULIZER FOUR TIMES DAILY AS NEEDED, Disp: 1080 mL, Rfl: 3   Lancets Misc. (ACCU-CHEK SOFTCLIX LANCET DEV) KIT, Check glucose BID Dx R73.03, Disp: 1 kit, Rfl: 0   loratadine (CLARITIN) 10 MG tablet, Take 1 tablet (10 mg total) by mouth daily., Disp: 30 tablet, Rfl: 11   macitentan (OPSUMIT) 10 MG tablet, Take 1 tablet (10 mg total) by mouth daily., Disp: 90 tablet, Rfl: 3   montelukast (SINGULAIR) 10 MG tablet, Take 1 tablet (10 mg total) by mouth at bedtime., Disp: 30 tablet, Rfl: 11   OXYGEN, Take 2.5-4 L by mouth  See admin instructions. 2.5 L with resting state & 4 L with exertion [facility], Disp: , Rfl:    Polyethyl Glycol-Propyl Glycol 0.4-0.3 % SOLN, Place 1 drop into both eyes daily., Disp: , Rfl:    potassium chloride SA (KLOR-CON) 20 MEQ tablet, TAKE 1 TABLET TWICE DAILY, Disp: 180 tablet, Rfl: 0   pravastatin (PRAVACHOL) 20 MG tablet, Take 1 tablet (20 mg total) by mouth daily., Disp: 90 tablet, Rfl: 0   Tiotropium Bromide-Olodaterol (STIOLTO RESPIMAT) 2.5-2.5 MCG/ACT AERS, Inhale 2 puffs into the lungs daily., Disp: 12 g, Rfl: 3   torsemide (DEMADEX) 20 MG tablet, Take 2 tablets (40 mg total) by mouth 2 (two) times daily., Disp: 360 tablet, Rfl: 3   UPTRAVI 200 MCG  TABS, Take 1 tablet twice daily., Disp: 60 tablet, Rfl: 11   Vitamin D, Ergocalciferol, (DRISDOL) 1.25 MG (50000 UT) CAPS capsule, TAKE 1 CAPSULE EVERY MONDAY, Disp: 12 capsule, Rfl: 3   zolpidem (AMBIEN) 5 MG tablet, Take 1 tablet (5 mg total) by mouth at bedtime as needed for sleep (please allow to fill every 30 days)., Disp: 30 tablet, Rfl: 3 Social History   Socioeconomic History   Marital status: Married    Spouse name: Jimmy   Number of children: 3   Years of education: Not on file   Highest education level: 12th grade  Occupational History   Occupation: Disabled  Tobacco Use   Smoking status: Former    Packs/day: 0.25    Years: 17.00    Pack years: 4.25    Types: Cigarettes    Quit date: 03/02/2015    Years since quitting: 6.2   Smokeless tobacco: Never  Vaping Use   Vaping Use: Never used  Substance and Sexual Activity   Alcohol use: No    Alcohol/week: 0.0 standard drinks   Drug use: Yes    Types: Cocaine    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 3 children. Enjoys gardening and spending time with grandchildren.    Social Determinants of Health   Financial Resource Strain: Not on file  Food Insecurity: Not on file  Transportation Needs: Not on file  Physical Activity: Not on file  Stress: Not on file  Social Connections: Not on file  Intimate Partner Violence: Not on file   Family History  Problem Relation Age of Onset   Hyperlipidemia Mother    Hypertension Mother    Diabetes Mother    Other Mother        varicose veins   Hyperlipidemia Father    Hypertension Father    Emphysema Father        smoked   Hypertension Daughter    Asthma Sister    Clotting disorder Daughter    Breast cancer Maternal Grandmother    Ovarian cancer Maternal Aunt     Objective: Office vital signs reviewed. There were no vitals taken  for this visit.  Physical Examination:  General: Awake, alert, chronically ill-appearing female, No acute distress HEENT: Sclera white Cardio: regular rate and rhythm, S1S2 heard, no murmurs appreciated Pulm: Decreased breath sounds in the left upper lobe compared to other fields.  She has normal work of breathing on 2 L supplemental oxygen Psych: Mood is stable.  Patient is pleasant and interactive.  Good eye contact Depression screen [facility] 2/9 05/22/2021 01/22/2021 09/20/2020  Decreased Interest  _0 Down, Depressed, Hopeless _1 PHQ - 2 Score _2 Altered sleeping 2 0 1  Tired, decreased energy 2 0 1  Change in appetite 2 0 1  Feeling bad or failure about yourself  3 0 0  Trouble concentrating 1 0 0  Moving slowly or fidgety/restless 1 0 0  Suicidal thoughts 3 0 0  PHQ-9 Score _3 Difficult doing work/chores - - Not difficult at all  Some recent data might be hidden   GAD 7 : Generalized Anxiety Score 05/22/2021 09/20/2020 06/20/2020 02/11/2020  Nervous, Anxious, on Edge _4 Control/stop worrying 2 0 2 3  Worry too much - different things _5 Trouble relaxing 2 0 2 1  Restless 2 0 2 1  Easily annoyed or irritable 3 0 1 3  Afraid - awful might happen _6 Total GAD 7 Score _7 Anxiety Difficulty Very difficult Somewhat difficult Somewhat difficult Somewhat difficult    Assessment/ Plan: 65 y.o. female   Psychophysiological insomnia - Plan: ToxASSURE [place] 13 (MW), Urine, zolpidem (AMBIEN) 5 MG tablet  Controlled substance agreement signed - Plan: ToxASSURE Select 13 (MW), Urine  Moderate episode of recurrent major depressive disorder ([place]) - Plan: citalopram (CELEXA) 20 MG tablet  Generalized anxiety disorder  Ambien renewed.  National narcotic database was reviewed and there were no red flags.  CSA and UDS were obtained as per office policy  Depression is not well controlled.  Celexa has been advanced to 20 mg daily.  I would like to  follow-up with her in the next 6 weeks for recheck.  We discussed augmenting medication if needed.  Advised that she may have somebody for virtual behavioral health contact her given elevations in anxiety and depressive scores.  She was not interested in therapy at this time  No orders of the defined types were placed in this encounter.  No orders of the defined types were placed in this encounter.    [name], DO [place] [phone]

## 2021-05-22 NOTE — Patient Instructions (Signed)
No medication changes  Labs done today, your results will be available in MyChart, we will contact you for abnormal readings.  Your physician has requested that you have an echocardiogram. Echocardiography is a painless test that uses sound waves to create images of your heart. It provides your doctor with information about the size and shape of your heart and how well your heart's chambers and valves are working. This procedure takes approximately one hour. There are no restrictions for this procedure.  Your physician recommends that you schedule a follow-up appointment in: October 2022 with any echocardiogram  If you have any questions or concerns before your next appointment please send [place] a message through [place] or call our office at [phone].    TO LEAVE A MESSAGE FOR THE NURSE SELECT OPTION 2, PLEASE LEAVE A MESSAGE INCLUDING: YOUR NAME DATE OF BIRTH CALL BACK NUMBER REASON FOR CALL**this is important as we prioritize the call backs  YOU WILL RECEIVE A CALL BACK THE SAME DAY AS LONG AS YOU CALL BEFORE 4:00 PM  At the [facility], you and your health needs are our priority. As part of our continuing mission to provide you with exceptional heart care, we have created designated Provider Care Teams. These Care Teams include your primary Cardiologist (physician) and Advanced Practice Providers (APPs- Physician Assistants and Nurse Practitioners) who all work together to provide you with the care you need, when you need it.   You may see any of the following providers on your designated Care Team at your next follow up: Dr Arvilla Meres Dr [name] Dr Brandon Melnick, NP Robbie Lis, [place] Mikki Santee Karle Plumber, PharmD   Please be sure to bring in all your medications bottles to every appointment.

## 2021-05-22 NOTE — Progress Notes (Signed)
Date:  05/22/2021   ID:  [name], DOB 1956/11/14, MRN [id number]  Provider location: [place] [facility] Type of Visit: Established patient   PCP:  [name], DO  Cardiologist:  Dr. [name]   History of Present Illness: Kelly Donaldson is a 65 y.o. female who has a history of chronic hypoxemic respiratory failure and pulmonary hypertension.  She has been followed by Dr Melvyn Novas, now follows with [name] with pulmonology.  Has been on home oxygen for several years now.  She quit smoking in 2016.  She had an echo done in 5/16 showing dilated RV with severe pulmonary hypertension.  She therefore had RHC in 5/16 which confirmed severe pulmonary hypertension and RV failure.  PFTs showed only mild obstruction and restriction with markedly decreased DLCO suggestive of pulmonary vascular disease.  There was concern for sarcoidosis on CT chest but lymph node biopsy showed no evidence for sarcoidosis.  V/Q scan showed no evidence for chronic PE.    She has not tolerated Revatio or Adcirca.  Have failed previous up-titration of Selexipag with pelvic pain, can tolerate 200 mcg bid.  She is now tolerating riociguat 2 mg tid.    Started torsemide in Oct. 2017, has had better diuresis.    Due to ongoing dyspnea, she had repeat RHC in 5/19 that showed normal filling pressures and well-controlled pulmonary hypertension.  Echo in 6/19 showed normal LV EF, normal RV systolic function, and mild RV dilation.    She now is on Anoro, feels like this has helped her breathing.    Echo in 9/20 showed EF 55-60%, mild RV dilation with normal systolic function, PASP 26 mmHg, IVC normal.   She has been diagnosed with rheumatoid arthritis, which may explain her ILD.   Echo in 11/21 showed EF 55-60% with normal RV, PASP 30, IVC normal.   She returns for followup of pulmonary HTN.  She is on 2L home oxygen chronically.  Weight is down 5 lbs.  Symptomatically, she is doing well.  No dyspnea  walking on flat ground.  No problems walking around in [employer].  No lightheadedness.  No chest pain.   ECG (personally reviewed): NSR, left axis deviation   6 minute walk (5/16): 238 meters => decreased oxygen saturation to 73%, increased to 6 L [place] 6 minute walk (7/16): 170 meters => unable to complete.  6 minute walk (9/16): 201 meters 6 minute walk (1/17): 256 meters 6 minute walk (4/17): 219 meters 6 minute walk (8/17): 225 meters 6 minute walk (11/17): 244 meters 6 minute walk (5/18): 229 meters 6 minute walk (2/19): 122 meters (but she was stopped halfway through with drop in oxygen saturation).  6 minute walk (12/20): 304 meters 6 minute walk (8/21): 476 m 6 minute walk (3/22): 213 meters   Labs (2/16): BNP 69 Labs (5/16): LFTs normal, HCT 34.9, RF negative, HIV negative, TSH negative Labs (6/16): K 4.4, creatinine 0.88 Labs (7/16): ANA negative, BNP 58, K 4.3, creatinine 1.1, anti-RNP negative Labs (9/16): K 4.4, creatinine 1.26, BNP 91 Labs (10/16): K 4.3, creatinine 1.04 Labs (12/16): K 4.2, creatinine 1.07, BNP 130 Labs (1/17): K 4.3, creatinine 1.23, BNP 42 Labs (3/17): K 4.2, creatinine 1.19 Labs (5/17): K 4.3, creatinine 1.35, BNP 44 Labs (6/17): K 4.4, creatinine 1.18, BNP 44 Labs (10/17): K 4.2, creatinine 0.98, BNP 43 Labs (12/17): K 4.5, creatinine 1.23. BNP 64.5 Labs (2/18): K 4.5, creatinine 1.21, BNP 64 Labs (4/18):  K 4.1, creatinine 1.24, BNP 26 Labs (9/18): K 4.6, creatinine 1.25 Labs (2/19): K 4.2, creatinine 1.39 Labs (5/19): K 4, creatinine 1.4, hgb 8.4 Labs (12/19): K 4.3, creatinine 1.15 Labs (6/20): TSH normal, K 4, creatinine 1.04, LDL 78 Labs (12/20): LDL 99, K 4.3, creatinine 1.0 Labs (4/21): BNP 36, K 4.1, creatinine 0.96 Labs (8/21): BNP 48, K 4.1, creatinine 0.88 Labs (2/22): hgb 12.2, BNP 22, K 4.1, creatinine 1.03, LDL 129 Labs (4/22): K 4.3, creatinine 1.13 . PMH: 1. Pulmonary hypertension: RHC (5/16) with mean RA 18, PA 83/33 mean  53, mean PCWP 19, CI 2.61, PVR 5.8 WU.   Echo (5/16) with EF 65-70%, septal flattening, dilated RV with PA systolic pressure 99 mmHg, +bubble study.  PFTs (4/16) with FVC 76%, FEV1 75%, ratio 97%, TLC 66%, DLCO 26% => mild restriction, mild obstruction, severe diffuse abnormality (pulmonary vascular problem).  CTA chest (4/16) with chronic interstitial and obstructive lung disease, small mediastinal and hilar lymph noes, no PE.  Lymph node biopsy negative for sarcoidosis (reactive changes).  V/Q scan (6/16): No acute or chronic PE. TEE (6/16): Normal LV size and systolic function, EF 88-41%, RV was mildly dilated with mildly decreased systolic function, D-shaped interventricular septum suggestive of RV pressure/volume overload, there appeared to be a small PFO present with some bubbles crossing, no ASD.  ANA negative, anti-RNP negative, HIV negative. Sleep study (8/16) without OSA.  She did not tolerate Adcirca or Revatio.  She cannot tolerate more than 200 mcg bid Selexipag.  - Echo (6/17): EF 66-06%, normal diastolic function, D-shaped interventricular septum, mildly dilated RV with normal RV systolic function, PASP 39, IVC normal.  - Echo (6/18): EF 30-16%, normal diastolic function, D-shaped interventricular septum, mildly dilated RV with normal RV systolic function, PASP 65 mmHg.  - RHC (7/18): RA 13, PA 51/23 mean 36, PCWP 19, CI 4.74, PVR 1.6 WU - RHC (5/19): mean RA 8, PA 48/20 mean 30, mean PCWP 13, CI 5.06, PVR 1.58 WU.  - Echo (6/19): EF 60-65%, mild RV dilation with normal RV systolic function, PASP 60, possible PFO.  - Echo (9/20): EF 55-60%, mild RV dilation with normal systolic function, PASP 26 mmHg, IVC normal.  - Echo (11/21): EF 55-60%, normal RV, PASP 30 mmHg, IVC normal.  2. Chronic venous insufficiency. 3. Hyperlipidemia.  4. GERD 5. Chronic hypoxemic respiratory failure: Hypoxemia, on home oxygen.  Seen by Dr Lake Bells, has emphysema + interstitial lung disease (?UIP => suspect  related to RA).  - PFTs (7/19): FEV1 82%, FVC 80%, ratio 101%, TLC 73%, DLCO 23%.  6. Fe deficiency anemia: EGD/colonoscopy [facility] normal in 2020.  7. Rheumatoid arthritis: Azathioprine.   Current Outpatient Medications  Medication Sig Dispense Refill   Accu-Chek Softclix Lancets lancets Check glucose BID Dx R73.03 200 each 3   ADEMPAS 2 MG TABS Take 2 mg by mouth 3 (three) times daily. Dose changes may occur throughout the course of therapy as directed by your prescriber. 270 tablet 3   albuterol (VENTOLIN HFA) 108 (90 Base) MCG/ACT inhaler Inhale 2 puffs into the lungs every 4 (four) hours as needed for wheezing or shortness of breath.     Alcohol Swabs (B-D SINGLE USE SWABS REGULAR) PADS Check glucose BID Dx R73.03 200 each 3   aspirin EC 81 MG tablet Take 1 tablet (81 mg total) by mouth daily. 90 tablet 3   azaTHIOprine (IMURAN) 50 MG tablet      Blood Glucose Monitoring Suppl (ACCU-CHEK AVIVA PLUS)  w/Device KIT Check glucose BID Dx R73.03 1 kit 0   Budeson-Glycopyrrol-Formoterol (BREZTRI AEROSPHERE) 160-9-4.8 MCG/ACT AERO Inhale 2 puffs into the lungs in the morning and at bedtime. 11.8 g 0   citalopram (CELEXA) 20 MG tablet Take 1 tablet (20 mg total) by mouth daily. 90 tablet 3   ferrous sulfate (FERROUSUL) 325 (65 FE) MG tablet Take 1 tablet (325 mg total) by mouth daily with breakfast. 90 tablet 3   fluticasone (FLONASE) 50 MCG/ACT nasal spray Place 1 spray into both nostrils daily. 48 g 3   glucose blood (ACCU-CHEK AVIVA PLUS) test strip Check glucose BID Dx R73.03 200 each 3   HYDROcodone-acetaminophen (NORCO) 10-325 MG tablet      ipratropium-albuterol (DUONEB) 0.5-2.5 (3) MG/3ML SOLN INHALE THE CONTENTS OF 1 VIAL VIA NEBULIZER FOUR TIMES DAILY AS NEEDED 1080 mL 3   Lancets Misc. (ACCU-CHEK SOFTCLIX LANCET DEV) KIT Check glucose BID Dx R73.03 1 kit 0   loratadine (CLARITIN) 10 MG tablet Take 1 tablet (10 mg total) by mouth daily. 30 tablet 11   macitentan (OPSUMIT) 10 MG  tablet Take 1 tablet (10 mg total) by mouth daily. 90 tablet 3   montelukast (SINGULAIR) 10 MG tablet Take 1 tablet (10 mg total) by mouth at bedtime. 30 tablet 11   OXYGEN Take 2.5-4 L by mouth See admin instructions. 2.5 L with resting state & 4 L with exertion [facility]     Polyethyl Glycol-Propyl Glycol 0.4-0.3 % SOLN Place 1 drop into both eyes daily.     potassium chloride SA (KLOR-CON) 20 MEQ tablet TAKE 1 TABLET TWICE DAILY 180 tablet 0   pravastatin (PRAVACHOL) 20 MG tablet Take 1 tablet (20 mg total) by mouth daily. 90 tablet 0   Selexipag (UPTRAVI) 600 MCG TABS Take 1 tablet by mouth daily.     torsemide (DEMADEX) 20 MG tablet Take 2 tablets (40 mg total) by mouth 2 (two) times daily. 360 tablet 3   Vitamin D, Ergocalciferol, (DRISDOL) 1.25 MG (50000 UT) CAPS capsule TAKE 1 CAPSULE EVERY MONDAY 12 capsule 3   zolpidem (AMBIEN) 5 MG tablet Take 1 tablet (5 mg total) by mouth at bedtime as needed for sleep (please allow to fill every 30 days). 30 tablet 5   No current facility-administered medications for this encounter.    Allergies:   Gabapentin and Latex   Social History:  The patient  reports that she quit smoking about 6 years ago. Her smoking use included cigarettes. She has a 4.25 pack-year smoking history. She has never used smokeless tobacco. She reports current drug use. Drug: Cocaine. She reports that she does not drink alcohol.   Family History:  The patient's family history includes Asthma in her sister; Breast cancer in her maternal grandmother; Clotting disorder in her daughter; Diabetes in her mother; Emphysema in her father; Hyperlipidemia in her father and mother; Hypertension in her daughter, father, and mother; Other in her mother; Ovarian cancer in her maternal aunt.   ROS:  Please see the history of present illness.   All other systems are personally reviewed and negative.   Exam:   BP 104/60   Pulse 70   Wt 96.2 kg (212 lb)   SpO2 93% Comment: 3 L n/c   BMI 33.20 kg/m  General: NAD Neck: No JVD, no thyromegaly or thyroid nodule.  Lungs: Clear to auscultation bilaterally with normal respiratory effort. CV: Nondisplaced PMI.  Heart regular S1/S2, no S3/S4, no murmur.  No peripheral edema.  No  carotid bruit.  Normal pedal pulses.  Abdomen: Soft, nontender, no hepatosplenomegaly, no distention.  Skin: Intact without lesions or rashes.  Neurologic: Alert and oriented x 3.  Psych: Normal affect. Extremities: No clubbing or cyanosis.  HEENT: Normal.   Recent Labs: 01/22/2021: ALT 6; Hemoglobin 12.2; Platelets 219 02/13/2021: BUN 10; Creatinine, Ser 0.89; Potassium 4.0; Sodium 136 05/22/2021: B Natriuretic Peptide 20.7  Personally reviewed   Wt Readings from Last 3 Encounters:  05/22/21 96.2 kg (212 lb)  05/22/21 94.6 kg (208 lb 9.6 oz)  04/13/21 94.6 kg (208 lb 9.6 oz)      ASSESSMENT AND PLAN:  1. Pulmonary arterial hypertension with RV failure: Severe on 5/16 RHC. 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  There was concern from CTA chest for sarcoidosis, but lymph node biopsy was negative.  ILD is likely related to rheumatoid arthritis. LFTs normal, ANA negative, anti-RNP negative, HIV negative.  V/Q scan negative for chronic PE.  Small PFO but no ASD on TEE. No OSA on sleep study.  She did not tolerate Revatio due to joint pain (now resolved), and she did not tolerate Adcirca with worsening dyspnea. She does not tolerate more than 300 mcg bid Selexipag due to pelvic pain.   RHCs in 7/18 and 5/19 have shown well-compensated pulmonary hypertension.  PVR was 1.58 WU on 5/19 study. Echo in 11/21 showed EF 55-60% with normal RV, PASP 30, normal IVC.   I think that a lot of her residual dyspnea is due to COPD + ILD rather than pulmonary hypertension.  Weight down 5 lbs. She does not look volume overloaded.   - Continue oxygen.  - Open lung biopsy deemed too  risky, probably not sarcoid per pulmonary evaluation => suspect ILD related to RA.    - Continue opsumit and riociguat 2 mg tid (unable to go higher due to dizziness).   - She has tolerated selexipag 300 mcg bid but has not been able to increase.  - Check BNP today.  - Echo at followup in 10/22.  2. Chronic hypoxemic respiratory failure: Chronic interstitial lung disease + emphysema.  Concern for sarcoidosis but biopsy did not show sarcoid.  Deemed too high risk for open lung biopsy.  Per pulmonary, she is in the fibrotic phase of NSIP.  She has been diagnosed with RA, this may be the source of her ILD.  - Continue pulmonary followup, sees Dr. Silas Flood.  3. Chronic diastolic CHF: Echo 66/06 with EF 55-60%, normal RV. NYHA II. She is not volume overloaded on exam.  - Continue torsemide 40 mg bid.  BMET today.     4. Hyperlipidemia: Lipids acceptable in 2/22.  5. Rheumatoid arthritis: On azathioprine, follows with Dr. Domingo Sep.   Followup 10/22 with echo  Signed, Loralie Champagne, MD  05/22/2021  [facility] [address] [facility] [place] [address] [phone] (office) [phone] (fax)

## 2021-05-29 LAB — TOXASSURE SELECT 13 (MW), URINE

## 2021-06-06 NOTE — Addendum Note (Signed)
Encounter addended by: Lorayne Bender on: 06/06/2021 7:34 AM  Actions taken: Imaging Exam begun

## 2021-06-06 NOTE — Addendum Note (Signed)
Encounter addended by: Lorayne Bender on: 06/06/2021 7:34 AM  Actions taken: Imaging Exam ended

## 2021-06-22 ENCOUNTER — Other Ambulatory Visit: Payer: Self-pay

## 2021-06-22 ENCOUNTER — Ambulatory Visit (INDEPENDENT_AMBULATORY_CARE_PROVIDER_SITE_OTHER): Payer: Medicare PPO | Admitting: Pulmonary Disease

## 2021-06-22 DIAGNOSIS — R0609 Other forms of dyspnea: Secondary | ICD-10-CM

## 2021-06-22 DIAGNOSIS — J849 Interstitial pulmonary disease, unspecified: Secondary | ICD-10-CM | POA: Diagnosis not present

## 2021-06-22 DIAGNOSIS — R0600 Dyspnea, unspecified: Secondary | ICD-10-CM

## 2021-06-22 LAB — PULMONARY FUNCTION TEST
DL/VA % pred: 40 %
DL/VA: 1.64 ml/min/mmHg/L
DLCO unc % pred: 31 %
DLCO unc: 6.88 ml/min/mmHg
FEF 25-75 Post: 1.86 L/sec
FEF 25-75 Pre: 1.55 L/sec
FEF2575-%Change-Post: 20 %
FEF2575-%Pred-Post: 88 %
FEF2575-%Pred-Pre: 73 %
FEV1-%Change-Post: 4 %
FEV1-%Pred-Post: 80 %
FEV1-%Pred-Pre: 76 %
FEV1-Post: 1.79 L
FEV1-Pre: 1.72 L
FEV1FVC-%Change-Post: 0 %
FEV1FVC-%Pred-Pre: 101 %
FEV6-%Change-Post: 3 %
FEV6-%Pred-Post: 80 %
FEV6-%Pred-Pre: 78 %
FEV6-Post: 2.24 L
FEV6-Pre: 2.17 L
FEV6FVC-%Change-Post: -1 %
FEV6FVC-%Pred-Post: 101 %
FEV6FVC-%Pred-Pre: 103 %
FVC-%Change-Post: 4 %
FVC-%Pred-Post: 79 %
FVC-%Pred-Pre: 75 %
FVC-Post: 2.28 L
FVC-Pre: 2.17 L
Post FEV1/FVC ratio: 79 %
Post FEV6/FVC ratio: 99 %
Pre FEV1/FVC ratio: 79 %
Pre FEV6/FVC Ratio: 100 %
RV % pred: 68 %
RV: 1.53 L
TLC % pred: 70 %
TLC: 3.86 L

## 2021-06-22 MED ORDER — BREZTRI AEROSPHERE 160-9-4.8 MCG/ACT IN AERO: 2.0000 | INHALATION_SPRAY | Freq: Two times a day (BID) | RESPIRATORY_TRACT | 0 refills | Status: DC

## 2021-06-22 NOTE — Progress Notes (Signed)
PFT done today. 

## 2021-07-03 ENCOUNTER — Ambulatory Visit (INDEPENDENT_AMBULATORY_CARE_PROVIDER_SITE_OTHER): Payer: Medicare PPO | Admitting: Family Medicine

## 2021-07-03 ENCOUNTER — Other Ambulatory Visit: Payer: Self-pay

## 2021-07-03 VITALS — BP 86/54 | HR 59 | Temp 97.1°F | Ht 67.0 in | Wt 214.0 lb

## 2021-07-03 DIAGNOSIS — J309 Allergic rhinitis, unspecified: Secondary | ICD-10-CM

## 2021-07-03 DIAGNOSIS — I5032 Chronic diastolic (congestive) heart failure: Secondary | ICD-10-CM | POA: Diagnosis not present

## 2021-07-03 DIAGNOSIS — F331 Major depressive disorder, recurrent, moderate: Secondary | ICD-10-CM | POA: Diagnosis not present

## 2021-07-03 DIAGNOSIS — I270 Primary pulmonary hypertension: Secondary | ICD-10-CM

## 2021-07-03 MED ORDER — LORATADINE 10 MG PO TABS: 10.0000 mg | ORAL_TABLET | Freq: Every day | ORAL | 3 refills | Status: DC

## 2021-07-03 MED ORDER — PRAVASTATIN SODIUM 20 MG PO TABS: 20.0000 mg | ORAL_TABLET | Freq: Every day | ORAL | 3 refills | Status: DC

## 2021-07-03 MED ORDER — VITAMIN D (ERGOCALCIFEROL) 1.25 MG (50000 UNIT) PO CAPS: 50000.0000 [IU] | ORAL_CAPSULE | ORAL | 3 refills | Status: AC

## 2021-07-03 MED ORDER — FLUTICASONE PROPIONATE 50 MCG/ACT NA SUSP: 1.0000 | Freq: Every day | NASAL | 3 refills | Status: DC

## 2021-07-03 MED ORDER — CITALOPRAM HYDROBROMIDE 20 MG PO TABS: 20.0000 mg | ORAL_TABLET | Freq: Every day | ORAL | 3 refills | Status: DC

## 2021-07-03 MED ORDER — POTASSIUM CHLORIDE CRYS ER 20 MEQ PO TBCR: EXTENDED_RELEASE_TABLET | ORAL | 3 refills | Status: DC

## 2021-07-03 NOTE — Progress Notes (Signed)
Subjective: CC:F/u GAD/ dpression PCP: Kelly Norlander, DO [id number] L Kelly Donaldson is a 65 y.o. female presenting to clinic today for:  1.  Anxiety and depression Patient was seen 1 month ago and her Celexa was increased due to uncontrolled anxiety and depressive symptoms.  She notes that she is feeling much better on the Celexa 20 mg daily.  She continues to use Ambien at nighttime.  Sleep.  No GI symptoms with increased dose.   ROS: Per HPI  Allergies  Allergen Reactions   Gabapentin Other (See Comments)    Pt states that she can not take with antidepressants   Latex Rash   Past Medical History:  Diagnosis Date   Allergy    Depression    GERD (gastroesophageal reflux disease)    Hyperlipidemia    Oxygen dependent    Shortness of breath dyspnea    Varicose veins     Current Outpatient Medications:    Accu-Chek Softclix Lancets lancets, Check glucose BID Dx R73.03, Disp: 200 each, Rfl: 3   ADEMPAS 2 MG TABS, Take 2 mg by mouth 3 (three) times daily. Dose changes may occur throughout the course of therapy as directed by your prescriber., Disp: 270 tablet, Rfl: 3   albuterol (VENTOLIN HFA) 108 (90 Base) MCG/ACT inhaler, Inhale 2 puffs into the lungs every 4 (four) hours as needed for wheezing or shortness of breath., Disp: , Rfl:    Alcohol Swabs (B-D SINGLE USE SWABS REGULAR) PADS, Check glucose BID Dx R73.03, Disp: 200 each, Rfl: 3   aspirin EC 81 MG tablet, Take 1 tablet (81 mg total) by mouth daily., Disp: 90 tablet, Rfl: 3   azaTHIOprine (IMURAN) 50 MG tablet, , Disp: , Rfl:    Blood Glucose Monitoring Suppl (ACCU-CHEK AVIVA PLUS) w/Device KIT, Check glucose BID Dx R73.03, Disp: 1 kit, Rfl: 0   Budeson-Glycopyrrol-Formoterol (BREZTRI AEROSPHERE) 160-9-4.8 MCG/ACT AERO, Inhale 2 puffs into the lungs in the morning and at bedtime., Disp: 11.8 g, Rfl: 0   Budeson-Glycopyrrol-Formoterol (BREZTRI AEROSPHERE) 160-9-4.8 MCG/ACT AERO, Inhale 2 puffs into the lungs in the morning  and at bedtime., Disp: 4.8 g, Rfl: 0   citalopram (CELEXA) 20 MG tablet, Take 1 tablet (20 mg total) by mouth daily., Disp: 90 tablet, Rfl: 3   ferrous sulfate (FERROUSUL) 325 (65 FE) MG tablet, Take 1 tablet (325 mg total) by mouth daily with breakfast., Disp: 90 tablet, Rfl: 3   fluticasone (FLONASE) 50 MCG/ACT nasal spray, Place 1 spray into both nostrils daily., Disp: 48 g, Rfl: 3   glucose blood (ACCU-CHEK AVIVA PLUS) test strip, Check glucose BID Dx R73.03, Disp: 200 each, Rfl: 3   HYDROcodone-acetaminophen (NORCO) 10-325 MG tablet, , Disp: , Rfl:    ipratropium-albuterol (DUONEB) 0.5-2.5 (3) MG/3ML SOLN, INHALE THE CONTENTS OF 1 VIAL VIA NEBULIZER FOUR TIMES DAILY AS NEEDED, Disp: 1080 mL, Rfl: 3   Lancets Misc. (ACCU-CHEK SOFTCLIX LANCET DEV) KIT, Check glucose BID Dx R73.03, Disp: 1 kit, Rfl: 0   loratadine (CLARITIN) 10 MG tablet, Take 1 tablet (10 mg total) by mouth daily., Disp: 30 tablet, Rfl: 11   macitentan (OPSUMIT) 10 MG tablet, Take 1 tablet (10 mg total) by mouth daily., Disp: 90 tablet, Rfl: 3   montelukast (SINGULAIR) 10 MG tablet, Take 1 tablet (10 mg total) by mouth at bedtime., Disp: 30 tablet, Rfl: 11   OXYGEN, Take 2.5-4 L by mouth See admin instructions. 2.5 L with resting state & 4 L with exertion [facility], Disp: ,  Rfl:    Polyethyl Glycol-Propyl Glycol 0.4-0.3 % SOLN, Place 1 drop into both eyes daily., Disp: , Rfl:    potassium chloride SA (KLOR-CON) 20 MEQ tablet, TAKE 1 TABLET TWICE DAILY, Disp: 180 tablet, Rfl: 0   pravastatin (PRAVACHOL) 20 MG tablet, Take 1 tablet (20 mg total) by mouth daily., Disp: 90 tablet, Rfl: 0   Selexipag (UPTRAVI) 600 MCG TABS, Take 1 tablet by mouth daily., Disp: , Rfl:    torsemide (DEMADEX) 20 MG tablet, Take 2 tablets (40 mg total) by mouth 2 (two) times daily., Disp: 360 tablet, Rfl: 3   Vitamin D, Ergocalciferol, (DRISDOL) 1.25 MG (50000 UT) CAPS capsule, TAKE 1 CAPSULE EVERY MONDAY, Disp: 12 capsule, Rfl: 3   zolpidem (AMBIEN) 5  MG tablet, Take 1 tablet (5 mg total) by mouth at bedtime as needed for sleep (please allow to fill every 30 days)., Disp: 30 tablet, Rfl: 5 Social History   Socioeconomic History   Marital status: Married    Spouse name: Jimmy   Number of children: 3   Years of education: Not on file   Highest education level: 12th grade  Occupational History   Occupation: Disabled  Tobacco Use   Smoking status: Former    Packs/day: 0.25    Years: 17.00    Pack years: 4.25    Types: Cigarettes    Quit date: 03/02/2015    Years since quitting: 6.3   Smokeless tobacco: Never  Vaping Use   Vaping Use: Never used  Substance and Sexual Activity   Alcohol use: No    Alcohol/week: 0.0 standard drinks   Drug use: Yes    Types: Cocaine    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 3 children. Enjoys gardening and spending time with grandchildren.    Social Determinants of Health   Financial Resource Strain: Not on file  Food Insecurity: Not on file  Transportation Needs: Not on file  Physical Activity: Not on file  Stress: Not on file  Social Connections: Not on file  Intimate Partner Violence: Not on file   Family History  Problem Relation Age of Onset   Hyperlipidemia Mother    Hypertension Mother    Diabetes Mother    Other Mother        varicose veins   Hyperlipidemia Father    Hypertension Father    Emphysema Father        smoked   Hypertension Daughter    Asthma Sister    Clotting disorder Daughter    Breast cancer Maternal Grandmother    Ovarian cancer Maternal Aunt     Objective: Office vital signs reviewed. BP (!) 86/54   Pulse (!) 59   Temp (!) 97.1 F (36.2 C) (Temporal)   Ht $R'5\' 7"'cP$  (1.702 m)   Wt 214 lb (97.1 kg)   SpO2 94%   BMI 33.52 kg/m   Physical Examination:  General: Awake, alert, No acute distress Cardio: regular rate  and rhythm, S1S2 heard, no murmurs appreciated Pulm: clear to auscultation bilaterally, no wheezes, rhonchi or rales; normal work of breathing on supplemental oxygen via nasal cannula Psych: Mood stable, speech normal, affect appropriate  Depression screen [facility] 2/9 07/03/2021 05/22/2021 01/22/2021  Decreased Interest 0 3 1  Down, Depressed, Hopeless 0 3 1  PHQ - 2 Score 0 6 2  Altered  sleeping 1 2 0  Tired, decreased energy 1 2 0  Change in appetite 1 2 0  Feeling bad or failure about yourself  0 3 0  Trouble concentrating 0 1 0  Moving slowly or fidgety/restless 0 1 0  Suicidal thoughts 0 3 0  PHQ-9 Score $RemoveBef'3 20 2  'nxtyFrTJGx$ Difficult doing work/chores Not difficult at all - -  Some recent data might be hidden   GAD 7 : Generalized Anxiety Score 07/03/2021 05/22/2021 09/20/2020 06/20/2020  Nervous, Anxious, on Edge 0 $Remo'2 1 2  'RuwSX$ Control/stop worrying 1 2 0 2  Worry too much - different things 0 $Remove'3 1 2  'erQDFeQ$ Trouble relaxing 0 2 0 2  Restless 1 2 0 2  Easily annoyed or irritable 0 3 0 1  Afraid - awful might happen $RemoveBefo'1 3 2 2  'OSjOSFXdcDy$ Total GAD 7 Score $Remov'3 17 4 13  'xegWim$ Anxiety Difficulty Not difficult at all Very difficult Somewhat difficult Somewhat difficult   Assessment/ Plan: 65 y.o. female   Moderate episode of recurrent major depressive disorder ([place]) - Plan: citalopram (CELEXA) 20 MG tablet  Chronic diastolic CHF (congestive heart failure) ([place]) - Plan: pravastatin (PRAVACHOL) 20 MG tablet  Primary pulmonary hypertension ([employer]) - Plan: potassium chloride SA (KLOR-CON) 20 MEQ tablet  Allergic rhinitis, unspecified seasonality, unspecified trigger - Plan: fluticasone (FLONASE) 50 MCG/ACT nasal spray  Anxiety and depressive symptoms are under much better control with increased dose of Celexa.  CHF, pulmonary hypertension and allergic rhinitis were not addressed during today's visit.  But patient needed refills and these have been sent accordingly.  No orders of the defined types were placed in this encounter.  No  orders of the defined types were placed in this encounter.    Kelly Norlander, DO [place] [phone]

## 2021-07-17 ENCOUNTER — Encounter: Payer: Self-pay | Admitting: Pulmonary Disease

## 2021-07-17 ENCOUNTER — Ambulatory Visit: Payer: Medicare PPO | Admitting: Pulmonary Disease

## 2021-07-17 ENCOUNTER — Other Ambulatory Visit: Payer: Self-pay

## 2021-07-17 VITALS — BP 124/74 | HR 85 | Temp 97.3°F | Ht 67.5 in | Wt 208.8 lb

## 2021-07-17 DIAGNOSIS — T148XXA Other injury of unspecified body region, initial encounter: Secondary | ICD-10-CM | POA: Diagnosis not present

## 2021-07-17 MED ORDER — BREZTRI AEROSPHERE 160-9-4.8 MCG/ACT IN AERO: 2.0000 | INHALATION_SPRAY | Freq: Two times a day (BID) | RESPIRATORY_TRACT | 5 refills | Status: DC

## 2021-07-17 NOTE — Patient Instructions (Signed)
Good to see you!  No medication changes.   We can get those labs to look into bruising when you come back on the 20th. Call us before to make sure the phlebotomist is working.   Return to clinic in 6 months or sooner as needed

## 2021-07-18 NOTE — Progress Notes (Signed)
@Patient  ID: Kelly Donaldson, female    DOB: [birth year], 65 y.o.   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  Formally patient of [name] and more recently [name].    Most recent cardiology note who manages pulmonary hypertension medications reviewed.  Patient doing well. Breathing stable. No issues. Requests refill on breztri. Reviewed results of recent PFTs. Values essentially stable over last few years. Adherent to pulm HTN meds. Reviewed most recent RHC with well controlled values.    Questionaires / Pulmonary Flowsheets:   ACT:  No flowsheet data found.  MMRC: mMRC Dyspnea Scale mMRC Score  04/13/2021 2    Epworth:  No flowsheet data found.  Tests:   FENO:  No results found for: NITRICOXIDE  PFT: PFT Results Latest Ref Rng & Units 06/22/2021 06/18/2018 03/20/2015  FVC-Pre L 2.17 2.37 2.33  FVC-Predicted Pre % 75 80 76  FVC-Post L 2.28 - 2.32  FVC-Predicted Post % 79 - 76  Pre FEV1/FVC % % 79 80 78  Post FEV1/FCV % % 79 - 77  FEV1-Pre L 1.72 1.90 1.81  FEV1-Predicted Pre % 76 82 75  FEV1-Post L 1.79 - 1.79  DLCO uncorrected ml/min/mmHg 6.88 6.52 7.39  DLCO UNC% % 31 23 26   DLCO corrected ml/min/mmHg - 8.12 -  DLCO COR %Predicted % - 28 -  DLVA Predicted % 40 43 35  TLC L 3.86 4.07 3.63  TLC % Predicted % 70 73 66  RV % Predicted % 68 81 15  Personally reviewed and interpreted as mild restriction with severely reduced DLCO consistent with concomitant ILD and pulmonary hypertension. Overall values stable since 2016.  WALK:  SIX MIN WALK 07/11/2020 11/22/2019 06/18/2018 11/26/2016 05/09/2015 03/20/2015 01/11/2015  Medications aspirin 81mg , hydrocodone-acetaminophen 10-325mg , opsumit 10mg , potassium chloride 20mg , adempas 2mg , selexipag 271mcg, torsemide 20mg ,  taken at 7:30am - - ASA 81mg , Celexa 40mg , Vit D 50000iu, Norco 10-325mg , Macitenan 10mg , klor Con 33meq, Riociguat 1.5mg , Selexipag 223mcg, Demedex 20mg  @ 0600 - - -  Supplimental Oxygen during Test? (L/min) Yes Yes Yes Yes Yes Yes Yes  O2 Flow Rate 3 2 6 4 4 2 2   Type Continuous Continuous Continuous Pulse Pulse Continuous Continuous  Laps 14 - - 5 - - -  Partial Lap (in Meters) 0 - - 15 - - -  Baseline BP (sitting) 112/70 - - 104/62 - - -  Baseline Heartrate 68 73 - 73 - - -  Baseline Dyspnea (Borg Scale) 0 - - 0.5 - - -  Baseline Fatigue (Borg Scale) 0 - - 0 - - -  Baseline SPO2 96 94 - 100 - - -  BP (sitting) 120/80 - - 124/58 - - -  Heartrate 85 100 - 92 - - -  Dyspnea (Borg Scale) 2 - - 4 - - -  Fatigue (Borg Scale) 2 - - 4 - - -  SPO2 84 54 - 87 - - -  BP (sitting) 114/76 - - 114/64 - - -  Heartrate 72 72 - 86 - - -  SPO2 97 94 - 93 - - -  Stopped or Paused before Six Minutes Yes Yes - Yes - - -  Other Symptoms at end of Exercise Pt stopped with 50 sec remaining due to  just "being done." Pt also stated that she felt like her knee was popping out of joint. tired - pt stopped at 3:33secs d/t fatigue and dyspnea, resumed 3:23secs.  stopped at 2:52sec d/t fatigue and dyspnea (Dyspnea Borg = 10 : sat = 86% 4lpm pulsed), increased O2 to 5lpm pulsed, sats recovered to 95%, resumed test at 1:02secs - - -  Interpretation Leg pain - - - - - -  Distance Completed 476 - - 255 - - -  Distance Completed 0 - - - - - -  Tech Comments: Pt walked at a normal pace, denied any complaints during walk. stopped with 50 seconds remaining due to being done with the walk. 304.19meters pt started walking on 3L but needed 6L to maintain at 91%, we stopped when O2 reached 75% TA/CMA - after sat dropped to 88%4lpm pulsed--increased o2 to 5lpm pulsed and pt walked another lap with sats ending at 79%5lpm pulsed/normal pace/no SOB//lmr After lap 1 sats= 87%2lpm--increased o2 to 4 lpm and walked another lap and sats =  89%4lpm cont/pt c/o minimal SOB/walked normal pace//lmr normal pace/stopped due to increased SOB//lmr    Imaging: Personally reviewed CT high-resolution chest most recent 06/2018 demonstrates upper lobe predominant emphysema as well as what appears to be NSIP pattern ILD with traction bronchiectasis in bilateral lower lobes on my interpretation  Lab Results: Personally reviewed, notably eosinophils as high as 700 CBC    Component Value Date/Time   WBC 6.6 01/22/2021 0947   WBC 7.0 05/06/2018 0948   RBC 4.18 01/22/2021 0947   RBC 4.02 05/06/2018 0948   HGB 12.2 01/22/2021 0947   HCT 37.4 01/22/2021 0947   PLT 219 01/22/2021 0947   MCV 90 01/22/2021 0947   MCH 29.2 01/22/2021 0947   MCH 20.9 (L) 05/06/2018 0948   MCHC 32.6 01/22/2021 0947   MCHC 28.9 (L) 05/06/2018 0948   RDW 13.0 01/22/2021 0947   LYMPHSABS 2.0 11/11/2019 0939   MONOABS 0.4 03/30/2015 1610   EOSABS 0.7 (H) 11/11/2019 0939   BASOSABS 0.1 11/11/2019 0939    BMET    Component Value Date/Time   NA 136 02/13/2021 1256   NA 143 01/22/2021 0947   K 4.0 02/13/2021 1256   CL 100 02/13/2021 1256   CO2 28 02/13/2021 1256   GLUCOSE 93 02/13/2021 1256   BUN 10 02/13/2021 1256   BUN 11 01/22/2021 0947   CREATININE 0.89 02/13/2021 1256   CALCIUM 9.0 02/13/2021 1256   GFRNONAA >60 02/13/2021 1256   GFRAA 66 01/22/2021 0947    BNP    Component Value Date/Time   BNP 20.7 05/22/2021 1422    ProBNP    Component Value Date/Time   PROBNP 69.0 01/11/2015 1630    Specialty Problems       Pulmonary Problems   Chronic respiratory failure with hypoxia ([place])    Followed in Pulmonary clinic/ [facility]/ [facility]  On 02 since 2013 (originally Kole[facility] Nimrod) - pulmonary eval [place] last seen 12/17/12 with "neg CTa for PE, pfts ok except dlco 20%, echo c/w  PH, 02 dep hypoxemia > rec RHC and stop phenterimine" -Echo 09/15/14     LAE mildly dilated, as are RV and RA  - 01/11/2015   Walked 2lpm  x one lap @ 185 stopped  due to  Sob, nl pace, no desat - Quit smoking 03/02/15  - PFTs 03/20/2015  Nl except for DLCO 26 corrects to 35%  - 03/20/2015   Walked 2lpm x one lap @  185 stopped due to sob/ desat corrected on 4lpm  - 04/07/2015 rec  referral to Dr Marigene Ehlers for Right heart Cath - Repeat Echo with bubble study 04/19/2015 > With injection of agitated saline intravenously   there is appearance of bubbles in L sided chambers consistent   with 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 - 05/09/2015   Walked 4lpm x one lap @ 185 stopped due to  88% one lap > 05/11/15 Pos small PFO - 07/11/2020 desat during 6 mw on 3lpm cont   As of 07/11/2020  = 2lpm hs and rest/ up to 5lpm POC           ILD (interstitial lung disease) ([place])    08/2016 high-resolution CT scan of the chest: [facility] review: Peripheral based interlobular septal thickening and groundglass consistent with fibrosis with some mild bronchiectasis, fibrotic changes appear to be more prominent in the lower lobes with upper lobe nodules, no air trapping noted on expiratory images, mediastinal lymphadenopathy stable per radiology  Chest HRCT 07/07/18 . CT pattern is considered indeterminate for usual interstitial pneumonia (UIP). 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 prior to study with DLCO  6.5 (23%) corrects to 2.23 (43%)  for alv volume and FV curve mild/mod curvature in effort indep portion of f/v > started on anoro and helped doe  CT chest 07/07/18 mild centrilobular and paraseptal emphysema. - 02/21/2020  After extensive coaching inhaler device,  effectiveness =    90% with smi > changed to stiolto per insurance  req         Allergies  Allergen Reactions   Gabapentin Other (See Comments)    Pt states that she can not take with antidepressants   Latex Rash    Immunization History  Administered Date(s) Administered   Influenza Split 09/16/2014, 09/20/2015, 09/08/2017, 10/04/2017   Influenza,inj,Quad PF,6+ Mos 09/04/2016, 11/12/2018, 08/17/2019, 09/07/2020   Influenza-Unspecified 10/13/2017   Moderna Sars-Covid-2 Vaccination 02/21/2020, 03/20/2020, 12/16/2020   Pneumococcal Polysaccharide-23 11/11/2019   Zoster Recombinat (Shingrix) 09/20/2020, 04/17/2021    Past Medical History:  Diagnosis Date   Allergy    Depression    GERD (gastroesophageal reflux disease)    Hyperlipidemia    Oxygen dependent    Shortness of breath dyspnea    Varicose veins     Tobacco History: Social History   Tobacco Use  Smoking Status Former   Packs/day: 0.25   Years: 17.00   Pack years: 4.25   Types: Cigarettes   Quit date: 03/02/2015   Years since quitting: 6.3  Smokeless Tobacco Never   Counseling given: Yes   Continue to not smoke  Outpatient Encounter Medications as of 07/17/2021  Medication Sig   Accu-Chek Softclix Lancets lancets Check glucose BID Dx R73.03   ADEMPAS 2 MG TABS Take 2 mg by mouth 3 (three) times daily. Dose changes may occur throughout the course of therapy as directed by your prescriber.   albuterol (VENTOLIN HFA) 108 (90 Base) MCG/ACT inhaler Inhale 2 puffs into the lungs every 4 (four) hours as needed for wheezing or shortness of breath.  Alcohol Swabs (B-D SINGLE USE SWABS REGULAR) PADS Check glucose BID Dx R73.03   aspirin EC 81 MG tablet Take 1 tablet (81 mg total) by mouth daily.   azaTHIOprine (IMURAN) 50 MG tablet    Blood Glucose Monitoring Suppl (ACCU-CHEK AVIVA PLUS) w/Device KIT Check glucose BID Dx R73.03   citalopram (CELEXA) 20 MG tablet Take 1 tablet (20 mg total) by mouth daily.   ferrous sulfate (FERROUSUL) 325 (65 FE) MG tablet Take 1 tablet (325 mg  total) by mouth daily with breakfast.   fluticasone (FLONASE) 50 MCG/ACT nasal spray Place 1 spray into both nostrils daily.   glucose blood (ACCU-CHEK AVIVA PLUS) test strip Check glucose BID Dx R73.03   HYDROcodone-acetaminophen (NORCO) 10-325 MG tablet    ipratropium-albuterol (DUONEB) 0.5-2.5 (3) MG/3ML SOLN INHALE THE CONTENTS OF 1 VIAL VIA NEBULIZER FOUR TIMES DAILY AS NEEDED   Lancets Misc. (ACCU-CHEK SOFTCLIX LANCET DEV) KIT Check glucose BID Dx R73.03   loratadine (CLARITIN) 10 MG tablet Take 1 tablet (10 mg total) by mouth daily.   macitentan (OPSUMIT) 10 MG tablet Take 1 tablet (10 mg total) by mouth daily.   montelukast (SINGULAIR) 10 MG tablet Take 1 tablet (10 mg total) by mouth at bedtime.   OXYGEN Take 2.5-4 L by mouth See admin instructions. 2.5 L with resting state & 4 L with exertion [facility]   Polyethyl Glycol-Propyl Glycol 0.4-0.3 % SOLN Place 1 drop into both eyes daily.   potassium chloride SA (KLOR-CON) 20 MEQ tablet TAKE 1 TABLET TWICE DAILY   pravastatin (PRAVACHOL) 20 MG tablet Take 1 tablet (20 mg total) by mouth daily.   Selexipag (UPTRAVI) 600 MCG TABS Take 1 tablet by mouth daily.   torsemide (DEMADEX) 20 MG tablet Take 2 tablets (40 mg total) by mouth 2 (two) times daily.   Vitamin D, Ergocalciferol, (DRISDOL) 1.25 MG (50000 UNIT) CAPS capsule Take 1 capsule (50,000 Units total) by mouth every 7 (seven) days.   zolpidem (AMBIEN) 5 MG tablet Take 1 tablet (5 mg total) by mouth at bedtime as needed for sleep (please allow to fill every 30 days).   [DISCONTINUED] Budeson-Glycopyrrol-Formoterol (BREZTRI AEROSPHERE) 160-9-4.8 MCG/ACT AERO Inhale 2 puffs into the lungs in the morning and at bedtime.   [DISCONTINUED] Budeson-Glycopyrrol-Formoterol (BREZTRI AEROSPHERE) 160-9-4.8 MCG/ACT AERO Inhale 2 puffs into the lungs in the morning and at bedtime.   Budeson-Glycopyrrol-Formoterol (BREZTRI AEROSPHERE) 160-9-4.8 MCG/ACT AERO Inhale 2 puffs into the lungs in the  morning and at bedtime.   No facility-administered encounter medications on file as of 07/17/2021.     Review of Systems  Review of Systems  N/a Physical Exam  BP 124/74 (BP Location: Left Arm)   Pulse 85   Temp (!) 97.3 F (36.3 C) (Oral)   Ht 5' 7.5" (1.715 m)   Wt 208 lb 12.8 oz (94.7 kg)   SpO2 95%   BMI 32.22 kg/m   Wt Readings from Last 5 Encounters:  07/17/21 208 lb 12.8 oz (94.7 kg)  07/03/21 214 lb (97.1 kg)  05/22/21 212 lb (96.2 kg)  05/22/21 208 lb 9.6 oz (94.6 kg)  04/13/21 208 lb 9.6 oz (94.6 kg)    BMI Readings from Last 5 Encounters:  07/17/21 32.22 kg/m  07/03/21 33.52 kg/m  05/22/21 33.20 kg/m  05/22/21 32.67 kg/m  04/13/21 32.67 kg/m     Physical Exam General: Well-appearing, no acute distress Eyes: EOMI, no icterus Neck: Supple no JVP Cardiovascular: Regular rate and rhythm, no murmur Pulmonary: Faint crackles in  bilateral bases, otherwise clear to auscultation, normal work of breathing on nasal cannula Abdomen: Nondistended, bowel sounds present MSK: No synovitis, joint effusion Skin: Scattered small ecchymoses L arm Neuro: Normal gait, no weakness Psych: Normal mood, full affect   Assessment & Plan:    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  On Imuran appears to be 100 mg daily, she is unsure but this is per our medication review via EMR.  Does not sound like dyspnea is related to worsening ILD.  Screen with PFTs at next visit, consider repeating CT scan pending results.  Pulmonary hypertension: Likely multifactorial, group 1 given connective tissue disease, group 3 given ILD.  Continue medicines per Dr. Marigene Ehlers and cardiology.  Seasonal allergies: Prescribed Flonase, montelukast.  To continue other allergy medications.  Asthma: atopic symptoms, mild improvement in breathing with ICS/LABA. Breztri refilled.  Ecchymoses: on left arm, side she sleeps. Suspect related to medications. Recent plts ok. Will repeat  CBC, draw INR, PTT for evaluation.   Return in about 6 months (around 01/17/2022).   Lanier Clam, MD 07/18/2021   This appointment required 45 minutes of patient care (this includes precharting, chart review, review of results, face-to-face care, etc.).

## 2021-08-06 ENCOUNTER — Other Ambulatory Visit (HOSPITAL_COMMUNITY): Payer: Self-pay | Admitting: *Deleted

## 2021-08-06 DIAGNOSIS — I270 Primary pulmonary hypertension: Secondary | ICD-10-CM

## 2021-08-06 MED ORDER — OPSUMIT 10 MG PO TABS: 10.0000 mg | ORAL_TABLET | Freq: Every day | ORAL | 3 refills | Status: DC

## 2021-08-07 ENCOUNTER — Telehealth: Payer: Self-pay | Admitting: Pulmonary Disease

## 2021-08-07 MED ORDER — BREZTRI AEROSPHERE 160-9-4.8 MCG/ACT IN AERO: 2.0000 | INHALATION_SPRAY | Freq: Two times a day (BID) | RESPIRATORY_TRACT | 5 refills | Status: DC

## 2021-08-07 NOTE — Telephone Encounter (Signed)
LM informing patient refill has been sent in.   Nothing further needed at this time.  

## 2021-08-27 ENCOUNTER — Other Ambulatory Visit (HOSPITAL_COMMUNITY): Payer: Self-pay | Admitting: Cardiology

## 2021-08-27 DIAGNOSIS — I270 Primary pulmonary hypertension: Secondary | ICD-10-CM

## 2021-08-28 ENCOUNTER — Ambulatory Visit (INDEPENDENT_AMBULATORY_CARE_PROVIDER_SITE_OTHER): Payer: Medicare PPO | Admitting: Family Medicine

## 2021-08-28 DIAGNOSIS — M26621 Arthralgia of right temporomandibular joint: Secondary | ICD-10-CM

## 2021-08-28 MED ORDER — PREDNISONE 20 MG PO TABS: 40.0000 mg | ORAL_TABLET | Freq: Every day | ORAL | 0 refills | Status: AC

## 2021-08-28 NOTE — Patient Instructions (Signed)
Jaw Range of Motion Exercises Jaw range of motion exercises are exercises that help your jaw move better. Exercises that help you have good posture (postural exercises) also help relieve jaw discomfort. These are often done along with range of motion exercises. These exercises can help prevent or improve: Difficulty opening your mouth. Pain in your jaw while it is open or closed. Temporomandibular joint (TMJ) pain. Headache caused by jaw tension. Take other actions to prevent or relieve jaw pain, such as: Avoiding things that cause or increase jaw pain. This may include: Chewing gum or eating hard foods. Clenching your jaw or teeth, grinding your teeth, or keeping tension in your jaw muscles. Opening your mouth wide, such as for a big yawn. Leaning on your jaw, such as resting your jaw in your hand while leaning on a desk. Putting ice on your jaw. Put ice in a plastic bag. Place a towel between your skin and the bag. Leave the ice on for 10-15 minutes, 2-3 times a day. Only do jaw exercises that your health care provider approves of. Only move your jaw as far as it can comfortably go in each direction. Do not move your jaw into positions that cause pain. Range of motion exercises Repeat each of these exercises 8 times, 1-2 times a day, or as told by your health care provider. Exercise A: Forward protrusion Push your jaw forward. Hold this position for 1-2 seconds. Allow your jaw to return to its normal position and rest it there for 1-2 seconds. Exercise B: Controlled opening Stand or sit in front of a mirror. Place your tongue on the roof of your mouth, just behind your top teeth. Keeping your tongue on the roof of your mouth, slowly open and close your mouth. While you open and close your mouth, watch your jaw in the mirror. Try to keep your jaw from moving to one side or the other. Exercise C: Right and left motion Move your jaw right. Hold this position for 1-2 seconds. Allow your jaw  to return to its normal position, and rest it there for 1-2 seconds. Move your jaw left. Hold this position for 1-2 seconds. Allow your jaw to return to its normal position, and rest it there for 1-2 seconds. Postural exercises Exercise A: Chin tucks You can do this exercise sitting, standing, or lying down. Move your head straight back, keeping your head level. You can guide the movement by placing your fingers on your chin to push your jaw back in an even motion. You should be able to feel a double chin form at the end of the motion. Hold this position for 5 seconds. Repeat 10-15 times. Exercise B: Shoulder blade squeeze Sit or stand. Bend your elbows to about 90 degrees, which is the shape of a capital letter "L." Keep your upper arms by your body. Squeeze your shoulder blades down and back, as though you were trying to touch your elbows behind you. Do not shrug your shoulders or move your head. Hold this position for 5 seconds. Repeat 10-15 times. Exercise C: Chest stretch Stand facing a corner. Put both of your hands and your forearms on the wall, with your arms wide apart. Make sure your arms are at a 90-degree angle to your body. This means that you should hold your arms straight out from your body, level with the floor. Step in toward the corner. Do not lean in. Hold this position for 30 seconds. Repeat 3 times. Contact a health care  provider if you have: Jaw pain that is new or gets worse. Clicking or popping sounds while doing the exercises. Get help right away if: Your jaw is stuck in one place and you cannot move it. You cannot open or close your mouth. Summary Jaw range of motion exercises are exercises that help your jaw move better. Take actions to prevent or relieve jaw pain: limit chewing gum or eating hard foods; clenching your jaw or teeth; or leaning on your jaw, such as resting your jaw in your hand while leaning on a desk. Repeat each of the jaw range of motion  exercises 8 times, 1-2 times a day, or as told by your health care provider. Contact a health care provider if you have clicking or popping sounds while doing the exercises. This information is not intended to replace advice given to you by your health care provider. Make sure you discuss any questions you have with your health care provider. Document Revised: 03/07/2021 Document Reviewed: 08/18/2020 Elsevier Patient Education  2022 [employer].

## 2021-08-28 NOTE — Progress Notes (Signed)
Telephone visit  Subjective: CC:jaw pain PCP: [name], DO YKD:[id number] L Kulkarni is a 65 y.o. female calls for telephone consult today. Patient provides verbal consent for consult held via phone.  Due to COVID-19 pandemic this visit was conducted virtually. This visit type was conducted due to national recommendations for restrictions regarding the COVID-19 Pandemic (e.g. social distancing, sheltering in place) in an effort to limit this patient's exposure and mitigate transmission in our community. All issues noted in this document were discussed and addressed.  A physical exam was not performed with this format.   Location of patient: home Location of provider: [facility] Others present for call: none  1. "Lock Jaw" About 1 week after COVID19 she started getting locking/ clicking of her jaw.  Her ear is hurting.  This is occurring on right side.  She wears dentures but they are brand-new.  She does not feel that she clenches or grinds her teeth at night.  She is able to open her mouth but she is having some discomfort that is not relieved by her Norco.  Unable to take oral NSAIDs secondary to comorbidities.   ROS: Per HPI  Allergies  Allergen Reactions   Gabapentin Other (See Comments)    Pt states that she can not take with antidepressants   Latex Rash   Past Medical History:  Diagnosis Date   Allergy    Depression    GERD (gastroesophageal reflux disease)    Hyperlipidemia    Oxygen dependent    Shortness of breath dyspnea    Varicose veins     Current Outpatient Medications:    Accu-Chek Softclix Lancets lancets, Check glucose BID Dx R73.03, Disp: 200 each, Rfl: 3   ADEMPAS 2 MG TABS, Take 2 mg by mouth 3 (three) times daily. Dose changes may occur throughout the course of therapy as directed by your prescriber., Disp: 270 tablet, Rfl: 3   albuterol (VENTOLIN HFA) 108 (90 Base) MCG/ACT inhaler, Inhale 2 puffs into the lungs every 4 (four) hours as needed for wheezing or  shortness of breath., Disp: , Rfl:    Alcohol Swabs (B-D SINGLE USE SWABS REGULAR) PADS, Check glucose BID Dx R73.03, Disp: 200 each, Rfl: 3   aspirin EC 81 MG tablet, Take 1 tablet (81 mg total) by mouth daily., Disp: 90 tablet, Rfl: 3   azaTHIOprine (IMURAN) 50 MG tablet, , Disp: , Rfl:    Blood Glucose Monitoring Suppl (ACCU-CHEK AVIVA PLUS) w/Device KIT, Check glucose BID Dx R73.03, Disp: 1 kit, Rfl: 0   Budeson-Glycopyrrol-Formoterol (BREZTRI AEROSPHERE) 160-9-4.8 MCG/ACT AERO, Inhale 2 puffs into the lungs in the morning and at bedtime., Disp: 4.8 g, Rfl: 5   citalopram (CELEXA) 20 MG tablet, Take 1 tablet (20 mg total) by mouth daily., Disp: 90 tablet, Rfl: 3   ferrous sulfate (FERROUSUL) 325 (65 FE) MG tablet, Take 1 tablet (325 mg total) by mouth daily with breakfast., Disp: 90 tablet, Rfl: 3   fluticasone (FLONASE) 50 MCG/ACT nasal spray, Place 1 spray into both nostrils daily., Disp: 48 g, Rfl: 3   glucose blood (ACCU-CHEK AVIVA PLUS) test strip, Check glucose BID Dx R73.03, Disp: 200 each, Rfl: 3   HYDROcodone-acetaminophen (NORCO) 10-325 MG tablet, , Disp: , Rfl:    ipratropium-albuterol (DUONEB) 0.5-2.5 (3) MG/3ML SOLN, INHALE THE CONTENTS OF 1 VIAL VIA NEBULIZER FOUR TIMES DAILY AS NEEDED, Disp: 1080 mL, Rfl: 3   Lancets Misc. (ACCU-CHEK SOFTCLIX LANCET DEV) KIT, Check glucose BID Dx R73.03, Disp: 1 kit,  Rfl: 0   loratadine (CLARITIN) 10 MG tablet, Take 1 tablet (10 mg total) by mouth daily., Disp: 90 tablet, Rfl: 3   macitentan (OPSUMIT) 10 MG tablet, Take 1 tablet (10 mg total) by mouth daily., Disp: 90 tablet, Rfl: 3   montelukast (SINGULAIR) 10 MG tablet, Take 1 tablet (10 mg total) by mouth at bedtime., Disp: 30 tablet, Rfl: 11   OXYGEN, Take 2.5-4 L by mouth See admin instructions. 2.5 L with resting state & 4 L with exertion [facility], Disp: , Rfl:    Polyethyl Glycol-Propyl Glycol 0.4-0.3 % SOLN, Place 1 drop into both eyes daily., Disp: , Rfl:    potassium chloride SA  (KLOR-CON) 20 MEQ tablet, TAKE 1 TABLET TWICE DAILY, Disp: 180 tablet, Rfl: 3   pravastatin (PRAVACHOL) 20 MG tablet, Take 1 tablet (20 mg total) by mouth daily., Disp: 90 tablet, Rfl: 3   Selexipag (UPTRAVI) 600 MCG TABS, Take 1 tablet by mouth daily., Disp: , Rfl:    torsemide (DEMADEX) 20 MG tablet, Take 2 tablets (40 mg total) by mouth 2 (two) times daily., Disp: 360 tablet, Rfl: 3   Vitamin D, Ergocalciferol, (DRISDOL) 1.25 MG (50000 UNIT) CAPS capsule, Take 1 capsule (50,000 Units total) by mouth every 7 (seven) days., Disp: 12 capsule, Rfl: 3   zolpidem (AMBIEN) 5 MG tablet, Take 1 tablet (5 mg total) by mouth at bedtime as needed for sleep (please allow to fill every 30 days)., Disp: 30 tablet, Rfl: 5  Assessment/ Plan: 65 y.o. female   Arthralgia of right temporomandibular joint - Plan: predniSONE (DELTASONE) 20 MG tablet  Unable to take oral NSAID secondary to comorbidities.  I considered a muscle relaxer but we will see how she responds to the prednisone first.  Want to limit her sedating medications given oxygen dependence and other sedating medication use.  We discussed that prednisone would be used in the short-term.  She is to use heat followed by ice on that area.  I have placed some jaw stretches and exercises upfront for her to retrieve and perform.  If has ongoing issues, may need to consider referring to ear nose  Start time: 1:32pm End time: 1:37pm  Total time spent on patient care (including telephone call/ virtual visit): 5 minutes  [place], [place] [phone]

## 2021-09-25 ENCOUNTER — Other Ambulatory Visit (HOSPITAL_COMMUNITY): Payer: Self-pay

## 2021-09-25 DIAGNOSIS — I270 Primary pulmonary hypertension: Secondary | ICD-10-CM

## 2021-09-25 MED ORDER — ADEMPAS 2 MG PO TABS: 2.0000 mg | ORAL_TABLET | Freq: Three times a day (TID) | ORAL | 0 refills | Status: DC

## 2021-10-02 ENCOUNTER — Telehealth (HOSPITAL_COMMUNITY): Payer: Self-pay | Admitting: Pharmacist

## 2021-10-02 DIAGNOSIS — I270 Primary pulmonary hypertension: Secondary | ICD-10-CM

## 2021-10-02 MED ORDER — ADEMPAS 2 MG PO TABS: 2.0000 mg | ORAL_TABLET | Freq: Three times a day (TID) | ORAL | 11 refills | Status: DC

## 2021-10-02 NOTE — Telephone Encounter (Signed)
Adempas prescription sent to [facility].    Karle Plumber, PharmD, BCPS, BCCP, CPP [facility] Failure Clinic Pharmacist [phone]

## 2021-10-16 ENCOUNTER — Other Ambulatory Visit: Payer: Self-pay

## 2021-10-16 ENCOUNTER — Encounter: Payer: Self-pay | Admitting: Family Medicine

## 2021-10-16 ENCOUNTER — Ambulatory Visit (INDEPENDENT_AMBULATORY_CARE_PROVIDER_SITE_OTHER): Payer: Medicare PPO | Admitting: Family Medicine

## 2021-10-16 DIAGNOSIS — J069 Acute upper respiratory infection, unspecified: Secondary | ICD-10-CM | POA: Diagnosis not present

## 2021-10-16 DIAGNOSIS — J029 Acute pharyngitis, unspecified: Secondary | ICD-10-CM

## 2021-10-16 LAB — VERITOR FLU A/B WAIVED
Influenza A: NEGATIVE
Influenza B: NEGATIVE

## 2021-10-16 NOTE — Progress Notes (Signed)
Virtual Visit via Telephone Note  I connected with Kelly Donaldson on 10/16/21 at 1:20 PM by telephone and verified that I am speaking with the correct person using two identifiers. Kelly Donaldson is currently located at home and her daughter is currently with her during this visit. The provider, Loman Brooklyn, FNP is located in their office at time of visit.  I discussed the limitations, risks, security and privacy concerns of performing an evaluation and management service by telephone and the availability of in person appointments. I also discussed with the patient that there may be a patient responsible charge related to this service. The patient expressed understanding and agreed to proceed.  Subjective: PCP: [name], DO  Chief Complaint  Patient presents with   URI   Patient complains of cough, head/chest congestion, headache, runny nose, sore throat, ear pain/pressure, facial pain/pressure, postnasal drainage, vomiting, and body aches . Onset of symptoms was 1 day ago, unchanged since that time. She is drinking plenty of fluids. Evaluation to date: at home COVID test negative. Treatment to date:  breathing treatment, Benadryl, and Claritin . She has a history of COPD and sarcoidosis. She does not smoke.    ROS: Per HPI  Current Outpatient Medications:    Accu-Chek Softclix Lancets lancets, Check glucose BID Dx R73.03, Disp: 200 each, Rfl: 3   ADEMPAS 2 MG TABS, Take 2 mg by mouth 3 (three) times daily., Disp: 90 tablet, Rfl: 11   albuterol (VENTOLIN HFA) 108 (90 Base) MCG/ACT inhaler, Inhale 2 puffs into the lungs every 4 (four) hours as needed for wheezing or shortness of breath., Disp: , Rfl:    Alcohol Swabs (B-D SINGLE USE SWABS REGULAR) PADS, Check glucose BID Dx R73.03, Disp: 200 each, Rfl: 3   aspirin EC 81 MG tablet, Take 1 tablet (81 mg total) by mouth daily., Disp: 90 tablet, Rfl: 3   azaTHIOprine (IMURAN) 50 MG tablet, , Disp: , Rfl:    Blood Glucose  Monitoring Suppl (ACCU-CHEK AVIVA PLUS) w/Device KIT, Check glucose BID Dx R73.03, Disp: 1 kit, Rfl: 0   Budeson-Glycopyrrol-Formoterol (BREZTRI AEROSPHERE) 160-9-4.8 MCG/ACT AERO, Inhale 2 puffs into the lungs in the morning and at bedtime., Disp: 4.8 g, Rfl: 5   citalopram (CELEXA) 20 MG tablet, Take 1 tablet (20 mg total) by mouth daily., Disp: 90 tablet, Rfl: 3   ferrous sulfate (FERROUSUL) 325 (65 FE) MG tablet, Take 1 tablet (325 mg total) by mouth daily with breakfast., Disp: 90 tablet, Rfl: 3   fluticasone (FLONASE) 50 MCG/ACT nasal spray, Place 1 spray into both nostrils daily., Disp: 48 g, Rfl: 3   glucose blood (ACCU-CHEK AVIVA PLUS) test strip, Check glucose BID Dx R73.03, Disp: 200 each, Rfl: 3   HYDROcodone-acetaminophen (NORCO) 10-325 MG tablet, , Disp: , Rfl:    ipratropium-albuterol (DUONEB) 0.5-2.5 (3) MG/3ML SOLN, INHALE THE CONTENTS OF 1 VIAL VIA NEBULIZER FOUR TIMES DAILY AS NEEDED, Disp: 1080 mL, Rfl: 3   Lancets Misc. (ACCU-CHEK SOFTCLIX LANCET DEV) KIT, Check glucose BID Dx R73.03, Disp: 1 kit, Rfl: 0   loratadine (CLARITIN) 10 MG tablet, Take 1 tablet (10 mg total) by mouth daily., Disp: 90 tablet, Rfl: 3   montelukast (SINGULAIR) 10 MG tablet, Take 1 tablet (10 mg total) by mouth at bedtime., Disp: 30 tablet, Rfl: 11   OPSUMIT 10 MG tablet, TAKE 1 TABLET BY MOUTH ONCE DAILY, Disp: 30 tablet, Rfl: 11   OXYGEN, Take 2.5-4 L by mouth See admin instructions.  2.5 L with resting state & 4 L with exertion [facility], Disp: , Rfl:    Polyethyl Glycol-Propyl Glycol 0.4-0.3 % SOLN, Place 1 drop into both eyes daily., Disp: , Rfl:    potassium chloride SA (KLOR-CON) 20 MEQ tablet, TAKE 1 TABLET TWICE DAILY, Disp: 180 tablet, Rfl: 3   pravastatin (PRAVACHOL) 20 MG tablet, Take 1 tablet (20 mg total) by mouth daily., Disp: 90 tablet, Rfl: 3   Selexipag (UPTRAVI) 600 MCG TABS, Take 1 tablet by mouth daily., Disp: , Rfl:    torsemide (DEMADEX) 20 MG tablet, Take 2 tablets (40 mg total)  by mouth 2 (two) times daily., Disp: 360 tablet, Rfl: 3   Vitamin D, Ergocalciferol, (DRISDOL) 1.25 MG (50000 UNIT) CAPS capsule, Take 1 capsule (50,000 Units total) by mouth every 7 (seven) days., Disp: 12 capsule, Rfl: 3   zolpidem (AMBIEN) 5 MG tablet, Take 1 tablet (5 mg total) by mouth at bedtime as needed for sleep (please allow to fill every 30 days)., Disp: 30 tablet, Rfl: 5  Allergies  Allergen Reactions   Gabapentin Other (See Comments)    Pt states that she can not take with antidepressants   Latex Rash   Past Medical History:  Diagnosis Date   Allergy    Depression    GERD (gastroesophageal reflux disease)    Hyperlipidemia    Oxygen dependent    Shortness of breath dyspnea    Varicose veins     Observations/Objective: A&O  No respiratory distress or wheezing audible over the phone Mood, judgement, and thought processes all WNL  Assessment and Plan: 1. Viral URI Discussed symptom management.  2. Sore throat - Veritor Flu A/B Waived; Future - Novel Coronavirus, NAA ([facility]); Future   Follow Up Instructions:  I discussed the assessment and treatment plan with the patient. The patient was provided an opportunity to ask questions and all were answered. The patient agreed with the plan and demonstrated an understanding of the instructions.   The patient was advised to call back or seek an in-person evaluation if the symptoms worsen or if the condition fails to improve as anticipated.  The above assessment and management plan was discussed with the patient. The patient verbalized understanding of and has agreed to the management plan. Patient is aware to call the clinic if symptoms persist or worsen. Patient is aware when to return to the clinic for a follow-up visit. Patient educated on when it is appropriate to go to the emergency department.   Time call ended: 1:31 PM  I provided 11 minutes of non-face-to-face time during this encounter.  Hendricks Limes, MSN,  APRN, FNP-C [facility] 10/16/21

## 2021-10-17 LAB — SARS-COV-2, NAA 2 DAY TAT

## 2021-10-17 LAB — NOVEL CORONAVIRUS, NAA: SARS-CoV-2, NAA: NOT DETECTED

## 2021-10-23 ENCOUNTER — Telehealth (HOSPITAL_COMMUNITY): Payer: Self-pay | Admitting: Pharmacy Technician

## 2021-10-23 ENCOUNTER — Encounter (HOSPITAL_COMMUNITY): Payer: Self-pay | Admitting: Pharmacy Technician

## 2021-10-23 NOTE — Telephone Encounter (Signed)
Advanced Heart Failure Patient Advocate Encounter  Received an application for Adempas assistance from [employer]. I called to speak with the patient to see if she was able to send her portion in or if she needed [place] to send it in for her. Her vm was full, no way for me to leave a message. Will send a mychart message.  I called and spoke with [employer]. The representative confirmed that they did receive the prescirber's portion and RX but the patient portion was missing.

## 2021-10-29 ENCOUNTER — Ambulatory Visit (INDEPENDENT_AMBULATORY_CARE_PROVIDER_SITE_OTHER): Payer: Medicare PPO | Admitting: Family Medicine

## 2021-10-29 ENCOUNTER — Other Ambulatory Visit: Payer: Self-pay

## 2021-10-29 ENCOUNTER — Encounter: Payer: Self-pay | Admitting: Family Medicine

## 2021-10-29 VITALS — BP 89/58 | HR 61 | Temp 98.0°F | Resp 20 | Ht 67.5 in | Wt 206.0 lb

## 2021-10-29 DIAGNOSIS — J9611 Chronic respiratory failure with hypoxia: Secondary | ICD-10-CM

## 2021-10-29 DIAGNOSIS — J208 Acute bronchitis due to other specified organisms: Secondary | ICD-10-CM

## 2021-10-29 DIAGNOSIS — I5032 Chronic diastolic (congestive) heart failure: Secondary | ICD-10-CM

## 2021-10-29 DIAGNOSIS — B9689 Other specified bacterial agents as the cause of diseases classified elsewhere: Secondary | ICD-10-CM

## 2021-10-29 DIAGNOSIS — I2721 Secondary pulmonary arterial hypertension: Secondary | ICD-10-CM | POA: Diagnosis not present

## 2021-10-29 DIAGNOSIS — F5104 Psychophysiologic insomnia: Secondary | ICD-10-CM | POA: Diagnosis not present

## 2021-10-29 DIAGNOSIS — I270 Primary pulmonary hypertension: Secondary | ICD-10-CM

## 2021-10-29 DIAGNOSIS — J309 Allergic rhinitis, unspecified: Secondary | ICD-10-CM

## 2021-10-29 DIAGNOSIS — F331 Major depressive disorder, recurrent, moderate: Secondary | ICD-10-CM

## 2021-10-29 MED ORDER — PRAVASTATIN SODIUM 20 MG PO TABS: 20.0000 mg | ORAL_TABLET | Freq: Every day | ORAL | 3 refills | Status: DC

## 2021-10-29 MED ORDER — LORATADINE 10 MG PO TABS: 10.0000 mg | ORAL_TABLET | Freq: Every day | ORAL | 3 refills | Status: DC

## 2021-10-29 MED ORDER — FLUTICASONE PROPIONATE 50 MCG/ACT NA SUSP: 1.0000 | Freq: Every day | NASAL | 3 refills | Status: DC

## 2021-10-29 MED ORDER — CITALOPRAM HYDROBROMIDE 20 MG PO TABS: 20.0000 mg | ORAL_TABLET | Freq: Every day | ORAL | 3 refills | Status: DC

## 2021-10-29 MED ORDER — POTASSIUM CHLORIDE CRYS ER 20 MEQ PO TBCR: EXTENDED_RELEASE_TABLET | ORAL | 3 refills | Status: DC

## 2021-10-29 MED ORDER — ZOLPIDEM TARTRATE 5 MG PO TABS
5.0000 mg | ORAL_TABLET | Freq: Every evening | ORAL | 5 refills | Status: DC | PRN
Start: 2021-11-25 — End: 2022-03-27

## 2021-10-29 MED ORDER — PROMETHAZINE-DM 6.25-15 MG/5ML PO SYRP: 2.5000 mL | ORAL_SOLUTION | Freq: Four times a day (QID) | ORAL | 0 refills | Status: DC | PRN

## 2021-10-29 MED ORDER — DOXYCYCLINE HYCLATE 100 MG PO TABS: 100.0000 mg | ORAL_TABLET | Freq: Two times a day (BID) | ORAL | 0 refills | Status: AC

## 2021-10-29 NOTE — Progress Notes (Signed)
Subjective: CC: Follow-up insomnia PCP: Kelly Ip, DO PVI:[id number] Kelly Donaldson is a 65 y.o. female presenting to clinic today for:  1.  Insomnia This is a chronic issue for patient that is well controlled with her Ambien 5 mg nightly.  Denies any excessive daytime sedation, falls, respiratory depression, visual or auditory hallucinations.  No sleepwalking.  No sleep eating.  2.  Productive cough Patient reports a productive cough with green sputum for 3 weeks now.  No fevers, chills, wheezing.  She is chronically on supplemental oxygen and is compliant with all of the recommendations by her specialist.  3.  Mood Patient reports very good mood with Celexa 20 mg daily.  No concerns except for that she needs refills   ROS: Per HPI  Allergies  Allergen Reactions   Gabapentin Other (See Comments)    Pt states that she can not take with antidepressants   Latex Rash   Past Medical History:  Diagnosis Date   Allergy    Depression    GERD (gastroesophageal reflux disease)    Hyperlipidemia    Oxygen dependent    Shortness of breath dyspnea    Varicose veins     Current Outpatient Medications:    Accu-Chek Softclix Lancets lancets, Check glucose BID Dx R73.03, Disp: 200 each, Rfl: 3   ADEMPAS 2 MG TABS, Take 2 mg by mouth 3 (three) times daily., Disp: 90 tablet, Rfl: 11   albuterol (VENTOLIN HFA) 108 (90 Base) MCG/ACT inhaler, Inhale 2 puffs into the lungs every 4 (four) hours as needed for wheezing or shortness of breath., Disp: , Rfl:    Alcohol Swabs (B-D SINGLE USE SWABS REGULAR) PADS, Check glucose BID Dx R73.03, Disp: 200 each, Rfl: 3   aspirin EC 81 MG tablet, Take 1 tablet (81 mg total) by mouth daily., Disp: 90 tablet, Rfl: 3   azaTHIOprine (IMURAN) 50 MG tablet, , Disp: , Rfl:    Blood Glucose Monitoring Suppl (ACCU-CHEK AVIVA PLUS) w/Device KIT, Check glucose BID Dx R73.03, Disp: 1 kit, Rfl: 0   Budeson-Glycopyrrol-Formoterol (BREZTRI AEROSPHERE) 160-9-4.8 MCG/ACT  AERO, Inhale 2 puffs into the lungs in the morning and at bedtime., Disp: 4.8 g, Rfl: 5   citalopram (CELEXA) 20 MG tablet, Take 1 tablet (20 mg total) by mouth daily., Disp: 90 tablet, Rfl: 3   doxycycline (VIBRA-TABS) 100 MG tablet, Take 1 tablet (100 mg total) by mouth 2 (two) times daily for 7 days., Disp: 14 tablet, Rfl: 0   ferrous sulfate (FERROUSUL) 325 (65 FE) MG tablet, Take 1 tablet (325 mg total) by mouth daily with breakfast., Disp: 90 tablet, Rfl: 3   fluticasone (FLONASE) 50 MCG/ACT nasal spray, Place 1 spray into both nostrils daily., Disp: 48 g, Rfl: 3   glucose blood (ACCU-CHEK AVIVA PLUS) test strip, Check glucose BID Dx R73.03, Disp: 200 each, Rfl: 3   HYDROcodone-acetaminophen (NORCO) 10-325 MG tablet, , Disp: , Rfl:    ipratropium-albuterol (DUONEB) 0.5-2.5 (3) MG/3ML SOLN, INHALE THE CONTENTS OF 1 VIAL VIA NEBULIZER FOUR TIMES DAILY AS NEEDED, Disp: 1080 mL, Rfl: 3   Lancets Misc. (ACCU-CHEK SOFTCLIX LANCET DEV) KIT, Check glucose BID Dx R73.03, Disp: 1 kit, Rfl: 0   loratadine (CLARITIN) 10 MG tablet, Take 1 tablet (10 mg total) by mouth daily., Disp: 90 tablet, Rfl: 3   montelukast (SINGULAIR) 10 MG tablet, Take 1 tablet (10 mg total) by mouth at bedtime., Disp: 30 tablet, Rfl: 11   OPSUMIT 10 MG tablet, TAKE 1 TABLET  BY MOUTH ONCE DAILY, Disp: 30 tablet, Rfl: 11   OXYGEN, Take 2.5-4 Kelly by mouth See admin instructions. 2.5 Kelly with resting state & 4 Kelly with exertion [facility], Disp: , Rfl:    Polyethyl Glycol-Propyl Glycol 0.4-0.3 % SOLN, Place 1 drop into both eyes daily., Disp: , Rfl:    potassium chloride SA (KLOR-CON) 20 MEQ tablet, TAKE 1 TABLET TWICE DAILY, Disp: 180 tablet, Rfl: 3   pravastatin (PRAVACHOL) 20 MG tablet, Take 1 tablet (20 mg total) by mouth daily., Disp: 90 tablet, Rfl: 3   promethazine-dextromethorphan (PROMETHAZINE-DM) 6.25-15 MG/5ML syrup, Take 2.5 mLs by mouth 4 (four) times daily as needed for cough., Disp: 118 mL, Rfl: 0   Selexipag (UPTRAVI) 600  MCG TABS, Take 1 tablet by mouth daily., Disp: , Rfl:    torsemide (DEMADEX) 20 MG tablet, Take 2 tablets (40 mg total) by mouth 2 (two) times daily., Disp: 360 tablet, Rfl: 3   Vitamin D, Ergocalciferol, (DRISDOL) 1.25 MG (50000 UNIT) CAPS capsule, Take 1 capsule (50,000 Units total) by mouth every 7 (seven) days., Disp: 12 capsule, Rfl: 3   [START ON 11/25/2021] zolpidem (AMBIEN) 5 MG tablet, Take 1 tablet (5 mg total) by mouth at bedtime as needed for sleep (please allow to fill every 30 days)., Disp: 30 tablet, Rfl: 5 Social History   Socioeconomic History   Marital status: Married    Spouse name: Kelly Donaldson   Number of children: 3   Years of education: Not on file   Highest education level: 12th grade  Occupational History   Occupation: Disabled  Tobacco Use   Smoking status: Former    Packs/day: 0.25    Years: 17.00    Pack years: 4.25    Types: Cigarettes    Quit date: 03/02/2015    Years since quitting: 6.6   Smokeless tobacco: Never  Vaping Use   Vaping Use: Never used  Substance and Sexual Activity   Alcohol use: No    Alcohol/week: 0.0 standard drinks   Drug use: Yes    Types: Cocaine    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 3 children. Enjoys gardening and spending time with grandchildren.    Social Determinants of Health   Financial Resource Strain: Not on file  Food Insecurity: Not on file  Transportation Needs: Not on file  Physical Activity: Not on file  Stress: Not on file  Social Connections: Not on file  Intimate Partner Violence: Not on file   Family History  Problem Relation Age of Onset   Hyperlipidemia Mother    Hypertension Mother    Diabetes Mother    Other Mother        varicose veins   Hyperlipidemia Father    Hypertension Father    Emphysema Father        smoked   Hypertension Daughter    Asthma  Sister    Clotting disorder Daughter    Breast cancer Maternal Grandmother    Ovarian cancer Maternal Aunt     Objective: Office vital signs reviewed. BP (!) 89/58   Pulse 61   Temp 98 F (36.7 C)   Resp 20   Ht 5' 7.5" (1.715 m)   Wt 206 lb (93.4 kg)   SpO2 92% Comment: on 2 liters  BMI 31.79 kg/m   Physical Examination:  General: Awake,  alert, chronically ill-appearing female, No acute distress HEENT: Normal; sclera white Cardio: regular rate and rhythm, S1S2 heard, no murmurs appreciated Pulm: Decreased breath sounds in the apices bilaterally but normal air movement in the lower lungs.  No wheezes, rhonchi or rales.  Normal work of breathing on supplemental oxygen Extremities: warm, well perfused, No edema, cyanosis or clubbing; +2 pulses bilaterally Psych: Mood stable, speech normal, affect appropriate  Depression screen [facility] 2/9 10/29/2021 07/03/2021 05/22/2021  Decreased Interest 1 0 3  Down, Depressed, Hopeless 0 0 3  PHQ - 2 Score 1 0 6  Altered sleeping 0 1 2  Tired, decreased energy $RemoveBeforeDE'2 1 2  'BLsqyGxvdnbvtNn$ Change in appetite $RemoveBef'1 1 2  'OQBVKsDVVg$ Feeling bad or failure about yourself  0 0 3  Trouble concentrating 0 0 1  Moving slowly or fidgety/restless 0 0 1  Suicidal thoughts 0 0 3  PHQ-9 Score $RemoveBef'4 3 20  'FbQLxKVPXz$ Difficult doing work/chores Somewhat difficult Not difficult at all -  Some recent data might be hidden   GAD 7 : Generalized Anxiety Score 10/29/2021 07/03/2021 05/22/2021 09/20/2020  Nervous, Anxious, on Edge 0 0 2 1  Control/stop worrying 0 1 2 0  Worry too much - different things 0 0 3 1  Trouble relaxing 0 0 2 0  Restless 0 1 2 0  Easily annoyed or irritable 1 0 3 0  Afraid - awful might happen $RemoveBefo'1 1 3 2  'lXVcoGHVwxW$ Total GAD 7 Score $Remov'2 3 17 4  'DTChaK$ Anxiety Difficulty Somewhat difficult Not difficult at all Very difficult Somewhat difficult      Assessment/ Plan: 65 y.o. female   Psychophysiological insomnia - Plan: zolpidem (AMBIEN) 5 MG tablet  Chronic respiratory failure with hypoxia (HCC) -  Plan: CMP14+EGFR, TSH, CBC  PAH (pulmonary artery hypertension) (HCC) - Plan: CMP14+EGFR, TSH, CBC  Chronic diastolic CHF (congestive heart failure) (HCC) - Plan: CMP14+EGFR, TSH, CBC, pravastatin (PRAVACHOL) 20 MG tablet  Acute bacterial bronchitis - Plan: doxycycline (VIBRA-TABS) 100 MG tablet, promethazine-dextromethorphan (PROMETHAZINE-DM) 6.25-15 MG/5ML syrup  Moderate episode of recurrent major depressive disorder (HCC) - Plan: citalopram (CELEXA) 20 MG tablet  Allergic rhinitis, unspecified seasonality, unspecified trigger - Plan: fluticasone (FLONASE) 50 MCG/ACT nasal spray  Primary pulmonary hypertension ([place]) - Plan: potassium chloride SA (KLOR-CON) 20 MEQ tablet  Ambien is chronic and stable.  She is up-to-date on CSA and UDS.  This has been renewed.  [place] narcotic database reviewed and there were no red flags  Check TSH, CMP, CBC.  She may be having an acute bacterial bronchitis and because of her underlying lung disease I am going to treat her with antibiotics though no focal findings were really noted on exam.  Her lung exam seems consistent with previous.  I did not place her on oral corticosteroids as she did not exactly sound like she was having an exacerbation given the absence of wheezes and other concerning features.  She understands red flag signs and symptoms warranting further evaluation  Mood disorder is stable.  Celexa renewed  We did not really address allergic rhinitis today but needed refills on her Flonase  Orders Placed This Encounter  Procedures   CMP14+EGFR   TSH   CBC   Meds ordered this encounter  Medications   zolpidem (AMBIEN) 5 MG tablet    Sig: Take 1 tablet (5 mg total) by mouth at bedtime as needed for sleep (please allow to fill every 30 days).    Dispense:  30 tablet    Refill:  5  doxycycline (VIBRA-TABS) 100 MG tablet    Sig: Take 1 tablet (100 mg total) by mouth 2 (two) times daily for 7 days.    Dispense:  14 tablet    Refill:  0    promethazine-dextromethorphan (PROMETHAZINE-DM) 6.25-15 MG/5ML syrup    Sig: Take 2.5 mLs by mouth 4 (four) times daily as needed for cough.    Dispense:  118 mL    Refill:  0   Advise to hold off on pneumococcal vaccination since she is acutely ill  [name], Kelly Donaldson [phone]

## 2021-10-29 NOTE — Patient Instructions (Signed)

## 2021-10-30 LAB — CMP14+EGFR
ALT: 6 IU/L (ref 0–32)
AST: 14 IU/L (ref 0–40)
Albumin/Globulin Ratio: 1.6 (ref 1.2–2.2)
Albumin: 4.1 g/dL (ref 3.8–4.8)
Alkaline Phosphatase: 56 IU/L (ref 44–121)
BUN/Creatinine Ratio: 16 (ref 12–28)
BUN: 17 mg/dL (ref 8–27)
Bilirubin Total: <0.2 mg/dL (ref 0.0–1.2)
CO2: 27 mmol/L (ref 20–29)
Calcium: 8.8 mg/dL (ref 8.7–10.3)
Chloride: 97 mmol/L (ref 96–106)
Creatinine, Ser: 1.04 mg/dL — ABNORMAL HIGH (ref 0.57–1.00)
Globulin, Total: 2.5 g/dL (ref 1.5–4.5)
Glucose: 90 mg/dL (ref 70–99)
Potassium: 4.6 mmol/L (ref 3.5–5.2)
Sodium: 138 mmol/L (ref 134–144)
Total Protein: 6.6 g/dL (ref 6.0–8.5)
eGFR: 60 mL/min/{1.73_m2} (ref >59)

## 2021-10-30 LAB — CBC
Hematocrit: 37.2 % (ref 34.0–46.6)
Hemoglobin: 11.7 g/dL (ref 11.1–15.9)
MCH: 26.8 pg (ref 26.6–33.0)
MCHC: 31.5 g/dL (ref 31.5–35.7)
MCV: 85 fL (ref 79–97)
Platelets: 241 10*3/uL (ref 150–450)
RBC: 4.36 x10E6/uL (ref 3.77–5.28)
RDW: 13.8 % (ref 11.7–15.4)
WBC: 8.6 10*3/uL (ref 3.4–10.8)

## 2021-10-30 LAB — TSH: TSH: 0.979 u[IU]/mL (ref 0.450–4.500)

## 2021-11-07 NOTE — Telephone Encounter (Signed)
Advanced Heart Failure Patient Advocate Encounter  Sent in [employer] entire application via fax, 11/29.  Will follow up.

## 2021-11-20 ENCOUNTER — Telehealth (HOSPITAL_COMMUNITY): Payer: Self-pay | Admitting: Pharmacy Technician

## 2021-11-20 ENCOUNTER — Other Ambulatory Visit (HOSPITAL_COMMUNITY): Payer: Self-pay

## 2021-11-20 ENCOUNTER — Telehealth (HOSPITAL_COMMUNITY): Payer: Self-pay | Admitting: Pharmacist

## 2021-11-20 NOTE — Telephone Encounter (Signed)
Advanced Heart Failure Patient Advocate Encounter  Prior Authorization for Kelly Donaldson has been approved.    PA# [id number] Effective dates: 12/09/20 through 12/08/22    Attempted to submit prior authorization for Adempas. Received message from [facility]:  Authorization already on file for this request. Authorization starting on 12/10/2019 and ending on 12/08/2022.  Karle Plumber, PharmD, BCPS, BCCP, CPP Heart Failure Clinic Pharmacist [phone]

## 2021-11-20 NOTE — Telephone Encounter (Signed)
Advanced Heart Failure Patient Advocate Encounter  Prior Authorization for Opsumit has been submitted and approved.    PA# [id number] Effective dates: 12/09/20 through 12/08/22  Archer Asa, CPhT

## 2021-11-29 NOTE — Telephone Encounter (Signed)
Advanced Heart Failure Patient Advocate Encounter   Patient was approved to receive Adempas from [employer] [id number]  Patient ID: [id number] Effective dates: 11/19/21 through 12/08/21

## 2021-12-07 ENCOUNTER — Other Ambulatory Visit (HOSPITAL_COMMUNITY): Payer: Self-pay

## 2021-12-07 DIAGNOSIS — I270 Primary pulmonary hypertension: Secondary | ICD-10-CM

## 2021-12-07 MED ORDER — ADEMPAS 2 MG PO TABS: 2.0000 mg | ORAL_TABLET | Freq: Three times a day (TID) | ORAL | 11 refills | Status: DC

## 2021-12-24 ENCOUNTER — Telehealth (HOSPITAL_COMMUNITY): Payer: Self-pay | Admitting: Pharmacy Technician

## 2021-12-24 ENCOUNTER — Other Ambulatory Visit (HOSPITAL_COMMUNITY): Payer: Self-pay

## 2021-12-24 DIAGNOSIS — I270 Primary pulmonary hypertension: Secondary | ICD-10-CM

## 2021-12-24 MED ORDER — OPSUMIT 10 MG PO TABS: 10.0000 mg | ORAL_TABLET | Freq: Every day | ORAL | 3 refills | Status: DC

## 2021-12-24 MED ORDER — ADEMPAS 2 MG PO TABS: 2.0000 mg | ORAL_TABLET | Freq: Three times a day (TID) | ORAL | 3 refills | Status: DC

## 2021-12-24 MED ORDER — UPTRAVI 600 MCG PO TABS: 1.0000 | ORAL_TABLET | Freq: Every day | ORAL | 3 refills | Status: DC

## 2021-12-24 NOTE — Telephone Encounter (Signed)
Advanced Heart Failure Patient Advocate Encounter  Patient was approved for a TAF grant that will cover the cost of Opsumit, Adempas and Uptravi.   Member Number [id number]  Group Number [id number] Coverage from date 12/09/2021 Coverage to date 12/08/2022 Claims Processing Information Pharmacy Claims PCN: [id number] BIN: [id number] Processing: 08 Phone: [phone] Fax: [phone]  Sent 30 day RX request to [facility] ([facility]) to send to [facility].   Archer Asa, CPhT

## 2022-01-14 ENCOUNTER — Other Ambulatory Visit (HOSPITAL_COMMUNITY): Payer: Self-pay | Admitting: *Deleted

## 2022-01-14 DIAGNOSIS — I270 Primary pulmonary hypertension: Secondary | ICD-10-CM

## 2022-01-14 MED ORDER — OPSUMIT 10 MG PO TABS: 10.0000 mg | ORAL_TABLET | Freq: Every day | ORAL | 3 refills | Status: DC

## 2022-01-27 ENCOUNTER — Other Ambulatory Visit (HOSPITAL_COMMUNITY): Payer: Self-pay | Admitting: Cardiology

## 2022-02-07 NOTE — Progress Notes (Signed)
x

## 2022-03-08 ENCOUNTER — Telehealth: Payer: Self-pay | Admitting: Pulmonary Disease

## 2022-03-08 MED ORDER — TRELEGY ELLIPTA 200-62.5-25 MCG/ACT IN AEPB: 1.0000 | INHALATION_SPRAY | Freq: Every day | RESPIRATORY_TRACT | 0 refills | Status: DC

## 2022-03-08 NOTE — Telephone Encounter (Signed)
Stay off Breztri through the weekend and let us know if jaw pain is better. If not better, the jaw pain is not related to [place] and can resume Breztri early next week.

## 2022-03-08 NOTE — Telephone Encounter (Signed)
That is an odd side effect. How long has she been not taking Breztri?

## 2022-03-08 NOTE — Telephone Encounter (Signed)
Called and spoke with patient to let her know new RX has been sent to preferred pharmacy. Nothing further needed at this time.  ?

## 2022-03-08 NOTE — Telephone Encounter (Signed)
Dr Judeth Horn please advise:  ? ?Called and spoke to patient who states her Kelly Donaldson is causing pain in her jaw and would like to switch inhalers?   ?

## 2022-03-08 NOTE — Telephone Encounter (Signed)
Spoke with the pt  ?She states that she is certain that it was the breztri causing jaw pain bc she stopped it 3/27 and pain gone now  ?She wants to go ahead and get started on something else  ?I advised will need appt since she is overdue  ?She is scheduled for 03/28/22 with Dr Judeth HornHunsucker, refused sooner visit with APP ?She wants something called in sooner than this if possible  ?Please advise, thanks! ?

## 2022-03-08 NOTE — Telephone Encounter (Signed)
Trelegy 200 mcg dose sent to local pharmacy

## 2022-03-08 NOTE — Telephone Encounter (Signed)
She stopped taking it this past Monday 03/04/2022.  ?

## 2022-03-27 ENCOUNTER — Ambulatory Visit (INDEPENDENT_AMBULATORY_CARE_PROVIDER_SITE_OTHER): Payer: Medicare PPO | Admitting: Family Medicine

## 2022-03-27 ENCOUNTER — Encounter: Payer: Self-pay | Admitting: Family Medicine

## 2022-03-27 VITALS — BP 80/49 | HR 78 | Temp 97.8°F | Ht 67.5 in | Wt 208.8 lb

## 2022-03-27 DIAGNOSIS — Z23 Encounter for immunization: Secondary | ICD-10-CM

## 2022-03-27 DIAGNOSIS — Z79899 Other long term (current) drug therapy: Secondary | ICD-10-CM | POA: Diagnosis not present

## 2022-03-27 DIAGNOSIS — F5104 Psychophysiologic insomnia: Secondary | ICD-10-CM | POA: Diagnosis not present

## 2022-03-27 MED ORDER — ZOLPIDEM TARTRATE 5 MG PO TABS: 5.0000 mg | ORAL_TABLET | Freq: Every evening | ORAL | 5 refills | Status: DC | PRN

## 2022-03-27 NOTE — Progress Notes (Signed)
? ?Subjective: ?CC: Follow-up insomnia ?PCP: [name], DO ?Kelly Donaldson is a 66 y.o. female presenting to clinic today for: ? ?1.  Insomnia ?Patient here for 24-month follow-up on insomnia.  She reports insomnia stable with Ambien 5 mg nightly.  She denies any excessive daytime sedation, falls, respiratory depression, visual or auditory hallucinations.  No sleepwalking.  She had no difficulties with concomitant use of her pain medication which she continues to take regularly and see pain management for. ? ? ?ROS: Per HPI ? ?Allergies  ?Allergen Reactions  ? Gabapentin Other (See Comments)  ?  Pt states that she can not take with antidepressants  ? Latex Rash  ? ?Past Medical History:  ?Diagnosis Date  ? Allergy   ? Depression   ? GERD (gastroesophageal reflux disease)   ? Hyperlipidemia   ? Oxygen dependent   ? Shortness of breath dyspnea   ? Varicose veins   ? ? ?Current Outpatient Medications:  ?  Accu-Chek Softclix Lancets lancets, Check glucose BID Dx R73.03, Disp: 200 each, Rfl: 3 ?  ADEMPAS 2 MG TABS, Take 2 mg by mouth 3 (three) times daily., Disp: 252 tablet, Rfl: 3 ?  albuterol (VENTOLIN HFA) 108 (90 Base) MCG/ACT inhaler, Inhale 2 puffs into the lungs every 4 (four) hours as needed for wheezing or shortness of breath., Disp: , Rfl:  ?  Alcohol Swabs (B-D SINGLE USE SWABS REGULAR) PADS, Check glucose BID Dx R73.03, Disp: 200 each, Rfl: 3 ?  aspirin EC 81 MG tablet, Take 1 tablet (81 mg total) by mouth daily., Disp: 90 tablet, Rfl: 3 ?  azaTHIOprine (IMURAN) 50 MG tablet, , Disp: , Rfl:  ?  Blood Glucose Monitoring Suppl (ACCU-CHEK AVIVA PLUS) w/Device KIT, Check glucose BID Dx R73.03, Disp: 1 kit, Rfl: 0 ?  citalopram (CELEXA) 20 MG tablet, Take 1 tablet (20 mg total) by mouth daily., Disp: 90 tablet, Rfl: 3 ?  ferrous sulfate (FERROUSUL) 325 (65 FE) MG tablet, Take 1 tablet (325 mg total) by mouth daily with breakfast., Disp: 90 tablet, Rfl: 3 ?  fluticasone (FLONASE) 50 MCG/ACT nasal  spray, Place 1 spray into both nostrils daily., Disp: 48 g, Rfl: 3 ?  Fluticasone-Umeclidin-Vilant (TRELEGY ELLIPTA) 200-62.5-25 MCG/ACT AEPB, Inhale 1 puff into the lungs daily., Disp: 60 each, Rfl: 0 ?  glucose blood (ACCU-CHEK AVIVA PLUS) test strip, Check glucose BID Dx R73.03, Disp: 200 each, Rfl: 3 ?  HYDROcodone-acetaminophen (NORCO) 10-325 MG tablet, , Disp: , Rfl:  ?  ipratropium-albuterol (DUONEB) 0.5-2.5 (3) MG/3ML SOLN, INHALE THE CONTENTS OF 1 VIAL VIA NEBULIZER FOUR TIMES DAILY AS NEEDED, Disp: 1080 mL, Rfl: 3 ?  Lancets Misc. (ACCU-CHEK SOFTCLIX LANCET DEV) KIT, Check glucose BID Dx R73.03, Disp: 1 kit, Rfl: 0 ?  loratadine (CLARITIN) 10 MG tablet, Take 1 tablet (10 mg total) by mouth daily., Disp: 90 tablet, Rfl: 3 ?  montelukast (SINGULAIR) 10 MG tablet, Take 1 tablet (10 mg total) by mouth at bedtime., Disp: 30 tablet, Rfl: 11 ?  OPSUMIT 10 MG tablet, Take 1 tablet (10 mg total) by mouth daily., Disp: 90 tablet, Rfl: 3 ?  OXYGEN, Take 2.5-4 L by mouth See admin instructions. 2.5 L with resting state & 4 L with exertion [facility], Disp: , Rfl:  ?  Polyethyl Glycol-Propyl Glycol 0.4-0.3 % SOLN, Place 1 drop into both eyes daily., Disp: , Rfl:  ?  potassium chloride SA (KLOR-CON) 20 MEQ tablet, TAKE 1 TABLET TWICE DAILY, Disp: 180 tablet, Rfl: 3 ?  pravastatin (PRAVACHOL) 20 MG tablet, Take 1 tablet (20 mg total) by mouth daily., Disp: 90 tablet, Rfl: 3 ?  promethazine-dextromethorphan (PROMETHAZINE-DM) 6.25-15 MG/5ML syrup, Take 2.5 mLs by mouth 4 (four) times daily as needed for cough., Disp: 118 mL, Rfl: 0 ?  Selexipag (UPTRAVI) 600 MCG TABS, Take 1 tablet by mouth daily., Disp: 90 tablet, Rfl: 3 ?  torsemide (DEMADEX) 20 MG tablet, Take 2 tablets (40 mg total) by mouth 2 (two) times daily., Disp: 360 tablet, Rfl: 3 ?  UPTRAVI 200 MCG TABS, Take 1 tablet (200 mcg total) by mouth 2 (two) times daily. NEEDS FOLLOW UP APPOINTMENT FOR ANY MORE REFILLS, Disp: 60 tablet, Rfl: 0 ?  Vitamin D,  Ergocalciferol, (DRISDOL) 1.25 MG (50000 UNIT) CAPS capsule, Take 1 capsule (50,000 Units total) by mouth every 7 (seven) days., Disp: 12 capsule, Rfl: 3 ?  [START ON 04/25/2022] zolpidem (AMBIEN) 5 MG tablet, Take 1 tablet (5 mg total) by mouth at bedtime as needed for sleep (please allow to fill every 30 days)., Disp: 30 tablet, Rfl: 5 ?Social History  ? ?Socioeconomic History  ? Marital status: Married  ?  Spouse name: Kelly Donaldson  ? Number of children: 3  ? Years of education: Not on file  ? Highest education level: 12th grade  ?Occupational History  ? Occupation: Disabled  ?Tobacco Use  ? Smoking status: Former  ?  Packs/day: 0.25  ?  Years: 17.00  ?  Pack years: 4.25  ?  Types: Cigarettes  ?  Quit date: 03/02/2015  ?  Years since quitting: 7.0  ? Smokeless tobacco: Never  ?Vaping Use  ? Vaping Use: Never used  ?Substance and Sexual Activity  ? Alcohol use: No  ?  Alcohol/week: 0.0 standard drinks  ? Drug use: Yes  ?  Types: Cocaine  ?  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 Sexual activity: Not Currently  ?Other Topics Concern  ? Not on file  ?Social History Narrative  ? Disabled, lives with husband Kelly Donaldson. 3 children. Enjoys gardening and spending time with grandchildren.   ? ?Social Determinants of Health  ? ?Financial Resource Strain: Not on file  ?Food Insecurity: Not on file  ?Transportation Needs: Not on file  ?Physical Activity: Not on file  ?Stress: Not on file  ?Social Connections: Not on file  ?Intimate Partner Violence: Not on file  ? ?Family History  ?Problem Relation Age of Onset  ? Hyperlipidemia Mother   ? Hypertension Mother   ? Diabetes Mother   ? Other Mother   ?     varicose veins  ? Hyperlipidemia Father   ? Hypertension Father   ? Emphysema Father   ?     smoked  ? Hypertension Daughter   ? Asthma Sister   ? Clotting disorder Daughter   ? Breast cancer Maternal Grandmother   ? Ovarian cancer Maternal Aunt   ? ? ?Objective: ?Office vital signs reviewed. ?BP (!) 80/49   Pulse 78   Temp 97.8  ?F (36.6 ?C)   Ht 5' 7.5" (1.715 m)   Wt 208 lb 12.8 oz (94.7 kg)   SpO2 (!) 88%   BMI 32.22 kg/m?  ? ?Physical Examination:  ?General: Awake, alert, nontoxic-appearing female, No acute distress ?HEENT: Sclera white.  Moist mucous membranes ?Cardio: regular rate and rhythm, S1S2 heard, no murmurs appreciated ?Pulm: Normal work of breathing on supplemental oxygen.  Decreased breath sounds in the upper airways bilaterally but no wheezes, rhonchi or rails ?Psych:  Mood stable, speech normal, affect appropriate ? ? ?  03/27/2022  ? 11:22 AM 10/29/2021  ?  1:27 PM 07/03/2021  ? 10:45 AM  ?Depression screen PHQ 2/9  ?Decreased Interest 0 1 0  ?Down, Depressed, Hopeless 1 0 0  ?PHQ - 2 Score 1 1 0  ?Altered sleeping  0 1  ?Tired, decreased energy  2 1  ?Change in appetite  1 1  ?Feeling bad or failure about yourself   0 0  ?Trouble concentrating  0 0  ?Moving slowly or fidgety/restless  0 0  ?Suicidal thoughts  0 0  ?PHQ-9 Score  4 3  ?Difficult doing work/chores  Somewhat difficult Not difficult at all  ? ? ?  03/27/2022  ? 11:22 AM 10/29/2021  ?  1:28 PM 07/03/2021  ? 10:46 AM 05/22/2021  ?  9:48 AM  ?GAD 7 : Generalized Anxiety Score  ?Nervous, Anxious, on Edge 0 0 0 2  ?Control/stop worrying 1 0 1 2  ?Worry too much - different things 0 0 0 3  ?Trouble relaxing 0 0 0 2  ?Restless 0 0 1 2  ?Easily annoyed or irritable 1 1 0 3  ?Afraid - awful might happen $RemoveBefo'1 1 1 3  'hPillFgFDAJ$ ?Total GAD 7 Score $Remov'3 2 3 17  'wLnnHU$ ?Anxiety Difficulty Somewhat difficult Somewhat difficult Not difficult at all Very difficult  ? ? ? ? ?Assessment/ Plan: ?66 y.o. female  ? ?Psychophysiological insomnia - Plan: zolpidem (AMBIEN) 5 MG tablet, CANCELED: ToxASSURE Select 13 (MW), Urine ? ?Controlled substance agreement signed - Plan: Drug Screen 10 W/Conf, Serum, CANCELED: ToxASSURE Select 13 (MW), Urine ? ?Need for pneumococcal 20-valent conjugate vaccination - Plan: Pneumococcal conjugate vaccine 20-valent (Prevnar 20) ? ?Insomnia stable.  Ambien renewed.  National  narcotic database reviewed and there were no red flags.  Last filled 03/26/2022.  Postdated prescription sent.  CSA and UDS were updated as per office policy today but insufficient urine was obtained so this was made into

## 2022-03-28 ENCOUNTER — Encounter: Payer: Self-pay | Admitting: Pulmonary Disease

## 2022-03-28 ENCOUNTER — Ambulatory Visit (INDEPENDENT_AMBULATORY_CARE_PROVIDER_SITE_OTHER): Payer: Medicare PPO | Admitting: Pulmonary Disease

## 2022-03-28 VITALS — BP 124/68 | HR 61 | Temp 98.6°F | Ht 69.0 in | Wt 209.0 lb

## 2022-03-28 DIAGNOSIS — J849 Interstitial pulmonary disease, unspecified: Secondary | ICD-10-CM | POA: Diagnosis not present

## 2022-03-28 MED ORDER — BREZTRI AEROSPHERE 160-9-4.8 MCG/ACT IN AERO: 2.0000 | INHALATION_SPRAY | Freq: Two times a day (BID) | RESPIRATORY_TRACT | 11 refills | Status: DC

## 2022-03-28 MED ORDER — ALBUTEROL SULFATE HFA 108 (90 BASE) MCG/ACT IN AERS: 2.0000 | INHALATION_SPRAY | RESPIRATORY_TRACT | Status: DC | PRN

## 2022-03-28 NOTE — Patient Instructions (Addendum)
Nice to see you again ? ?Okay to stop Trelegy, I sent a new prescription for Breztri 2 puffs twice a day every day.  Rinse your mouth out with water after every use. ? ?I also sent the refill or new prescription for the albuterol rescue inhaler, 2 puffs every 4 hours as needed for wheezing or shortness of breath. ? ?We will get a repeat CT scan to keep an eye on the lungs in July, we will schedule follow-up appoint with me shortly thereafter to discuss results. ? ?Return to clinic in 3 months or sooner as needed with Dr. Judeth Horn ?

## 2022-03-28 NOTE — Progress Notes (Signed)
? ?@Patient  ID: Kelly Donaldson, female    DOB: 1956/07/03, 66 y.o.   MRN: [id number] ? ?Chief Complaint  ?Patient presents with  ? Follow-up  ?  Here to talk about medication inhalers. She switched from [place] to trelegy. Wants to possilby go back to the [place]. Needs refill on Albuterol. Is on 2L of O2.   ? ? ?Referring provider: ?Ronnie Doss M, DO ? ?HPI:  ? 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  Formally patient of [name] and more recently [name].    Most recent PCP note x 2 reviewed. ? ?Overall doing well.  Has had some jaw pain.  Initially thought related to [place].  However has been off Breztri for a while.  Not using Trelegy.  Breathing not as good as it was on exertion.  Jaw pain persist.  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  Would like to resume pressure given benefit and now no longer thinking this is a side effect of medicine.  Otherwise she did well.  Continue Imuran 100 mg daily.  As for arthritis.  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  Plan for repeat CT scan in July was discussed with patient. ? ? ?Questionaires / Pulmonary Flowsheets:  ? ?ACT:  ?   ? View : No data to display.  ?  ?  ?  ? ? ?MMRC: ?mMRC Dyspnea Scale mMRC Score  ?04/13/2021 ?10:39 AM 2  ? ? ?Epworth:  ?   ? View : No data to display.  ?  ?  ?  ? ? ?Tests:  ? ?FENO:  ?No results found for: NITRICOXIDE ? ?PFT: ? ?  Latest Ref Rng & Units 06/22/2021  ? 12:01 PM 06/18/2018  ?  9:08 AM 03/20/2015  ?  9:09 AM  ?PFT Results  ?FVC-Pre L 2.17   2.37   2.33    ?FVC-Predicted Pre % 75   80   76    ?FVC-Post L 2.28    2.32    ?FVC-Predicted Post % 79    76    ?Pre FEV1/FVC % % 79   80   78    ?Post FEV1/FCV % % 79    77    ?FEV1-Pre L 1.72   1.90   1.81    ?FEV1-Predicted Pre % 76   82   75    ?FEV1-Post L 1.79    1.79    ?DLCO uncorrected ml/min/mmHg 6.88   6.52   7.39    ?DLCO UNC% % 31   23   26     ?DLCO corrected  ml/min/mmHg  8.12     ?DLCO COR %Predicted %  28     ?DLVA Predicted % 40   43   35    ?TLC L 3.86   4.07   3.63    ?TLC % Predicted % 70   73   66    ?RV % Predicted % 68   81   38    ?Personally reviewed and interpreted as mild restriction with severely reduced DLCO consistent with concomitant ILD and pulmonary hypertension. Overall values stable since 2016. ? ?WALK:  ? ?  07/11/2020  ? 10:48 AM 11/22/2019  ?  8:58 AM 06/18/2018  ? 10:50 AM 11/26/2016  ? 10:48 AM 05/09/2015  ?  9:39 AM 03/20/2015  ? 12:14 PM 01/11/2015  ?  4:00 PM  ?  SIX MIN WALK  ?Medications aspirin 81mg , hydrocodone-acetaminophen 10-325mg , opsumit 10mg , potassium chloride 20mg , adempas 2mg , selexipag 242mcg, torsemide 20mg , taken at 7:30am   ASA 81mg , Celexa 40mg , Vit D 50000iu, Norco 10-325mg , Macitenan 10mg , klor Con 53meq, Riociguat 1.5mg , Selexipag 26mcg, Demedex 20mg  @ 0600     ?Supplimental Oxygen during Test? (L/min) Yes Yes Yes Yes Yes Yes Yes  ?O2 Flow Rate 3 L/min 2 L/min 6 L/min 4 L/min 4 L/min 2 L/min 2 L/min  ?Type Continuous Continuous Continuous Pulse Pulse Continuous Continuous  ?Laps 14   5     ?Partial Lap (in Meters) 0 meters   15 meters     ?Baseline BP (sitting) 112/70   104/62     ?Baseline Heartrate 68 73  73     ?Baseline Dyspnea (Borg Scale) 0   0.5     ?Baseline Fatigue (Borg Scale) 0   0     ?Baseline SPO2 96 % 94 %  100 %     ?BP (sitting) 120/80   124/58     ?Heartrate 85 100  92     ?Dyspnea (Borg Scale) 2   4     ?Fatigue (Borg Scale) 2   4     ?SPO2 84 % 54 %  87 %     ?BP (sitting) 114/76   114/64     ?Heartrate 72 72  86     ?SPO2 97 % 94 %  93 %     ?Stopped or Paused before Six Minutes Yes Yes  Yes     ?Other Symptoms at end of Exercise Pt stopped with 50 sec remaining due to just "being done." Pt also stated that she felt like her knee was popping out of joint. tired  pt stopped at 3:33secs d/t fatigue and dyspnea, resumed 3:23secs.  stopped at 2:52sec d/t fatigue and dyspnea (Dyspnea Borg = 10  sat = 86% 4lpm  pulsed), increased O2 to 5lpm pulsed, sats recovered to 95%, resumed test at 1:02secs     ?Interpretation Leg pain        ?Distance Completed 476 meters   255 meters     ?Distance Completed 0 meters        ?Tech Comments: Pt walked at a normal pace, denied any complaints during walk. stopped with 50 seconds remaining due to being done with the walk. 304.52meters pt started walking on 3L but needed 6L to maintain at 91%, we stopped when O2 reached 75% TA/CMA  after sat dropped to 88%4lpm pulsed--increased o2 to 5lpm pulsed and pt walked another lap with sats ending at 79%5lpm pulsed/normal pace/no SOB//lmr After lap 1 sats= 87%2lpm--increased o2 to 4 lpm and walked another lap and sats = 89%4lpm cont/pt c/o minimal SOB/walked normal pace//lmr normal pace/stopped due to increased SOB//lmr  ? ? ?Imaging: ?Personally reviewed CT high-resolution chest most recent 06/2018 demonstrates upper lobe predominant emphysema as well as what appears to be NSIP pattern ILD with traction bronchiectasis in bilateral lower lobes on my interpretation ? ?Lab Results: ?Personally reviewed, notably eosinophils as high as 700 ?CBC ?   ?Component Value Date/Time  ? WBC 8.6 10/29/2021 1354  ? WBC 7.0 05/06/2018 0948  ? RBC 4.36 10/29/2021 1354  ? RBC 4.02 05/06/2018 0948  ? HGB 11.7 10/29/2021 1354  ? HCT 37.2 10/29/2021 1354  ? PLT 241 10/29/2021 1354  ? MCV 85 10/29/2021 1354  ? MCH 26.8 10/29/2021 1354  ? MCH 20.9 (L) 05/06/2018 0948  ? MCHC 31.5  10/29/2021 1354  ? MCHC 28.9 (L) 05/06/2018 0948  ? RDW 13.8 10/29/2021 1354  ? LYMPHSABS 2.0 11/11/2019 0939  ? MONOABS 0.4 03/30/2015 1610  ? EOSABS 0.7 (H) 11/11/2019 [id number]  ? BASOSABS 0.1 11/11/2019 [id number]  ? ? ?BMET ?   ?Component Value Date/Time  ? NA 138 10/29/2021 1354  ? K 4.6 10/29/2021 1354  ? CL 97 10/29/2021 1354  ? CO2 27 10/29/2021 1354  ? GLUCOSE 90 10/29/2021 1354  ? GLUCOSE 93 02/13/2021 1256  ? BUN 17 10/29/2021 1354  ? CREATININE 1.04 (H) 10/29/2021 1354  ? CALCIUM 8.8 10/29/2021  1354  ? GFRNONAA >60 02/13/2021 1256  ? GFRAA 66 01/22/2021 0947  ? ? ?BNP ?   ?Component Value Date/Time  ? BNP 20.7 05/22/2021 1422  ? ? ?ProBNP ?   ?Component Value Date/Time  ? PROBNP 69.0 01/11/2015 1630  ? ? ?Specialty Problems   ? ?  ? Pulmonary Problems  ? Chronic respiratory failure with hypoxia (HCC)  ?  Followed in Pulmonary clinic/ [facility]/ [facility]  ?On 02 since 2013 (originally [place]) ?- pulmonary eval [facility] last seen 12/17/12 with "neg CTa for PE, pfts ok except dlco 20%, echo c/w  PH, 02 dep hypoxemia > rec RHC and stop phenterimine" ?-Echo 09/15/14  ?   LAE mildly dilated, as are RV and RA  ?- 01/11/2015   Walked 2lpm  x one lap @ 185 stopped due to  Sob, nl pace, no desat ?- Quit smoking 03/02/15  ?- PFTs 03/20/2015  Nl except for DLCO 26 corrects to 35%  ?- 03/20/2015   Walked 2lpm x one lap @ 185 stopped due to sob/ desat corrected on 4lpm  ?- 04/07/2015 rec  referral to Dr Marigene Ehlers for Right heart Cath ?- Repeat Echo with bubble study 04/19/2015 > With injection of agitated saline intravenously ?  there is appearance of bubbles in L sided chambers consistent ?  with 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 ?- 05/09/2015   Walked 4lpm x one lap @ 185 stopped due to  88% one lap ?> 05/11/15 Pos small PFO ?- 07/11/2020 desat during 6 mw on 3lpm cont  ? ?As of 07/11/2020  = 2lpm hs and rest/ up to 5lpm POC ? ? ?   ?  ?  ? ILD (interstitial lung disease) ([place])  ?  08/2016 high-resolution CT scan of the chest: [facility]aid review: Peripheral based interlobular septal thickening and groundglass consistent with fibrosis with some mild bronchiectasis, fibrotic changes appear to be more prominent in the lower lobes with upper lobe nodules, no air trapping noted on expiratory images, mediastinal lymphadenopathy stable per radiology ? Chest HRCT 07/07/18 ?. CT pattern is considered indeterminate for usual interstitial ?pneumonia (UIP). Overall, given the stability of the imaging ?features and the spectrum of findings, this is strongly  favored to ?reflect fibrotic phase nonspecific interstitial pneumonia (NSIP) ?  ?  ? 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 Centrilobular emphysema ([place])  ? Allergic rhinitis  ? COPD GOLD 0   ?

## 2022-04-08 LAB — DRUG SCREEN 10 W/CONF, SERUM
Amphetamines, IA: NEGATIVE ng/mL
Barbiturates, IA: NEGATIVE ug/mL
Benzodiazepines, IA: NEGATIVE ng/mL
Cocaine & Metabolite, IA: NEGATIVE ng/mL
Methadone, IA: NEGATIVE ng/mL
Opiates, IA: POSITIVE ng/mL — AB
Oxycodones, IA: NEGATIVE ng/mL
Phencyclidine, IA: NEGATIVE ng/mL
Propoxyphene, IA: NEGATIVE ng/mL
THC(Marijuana) Metabolite, IA: NEGATIVE ng/mL

## 2022-04-08 LAB — OPIATES,MS,WB/SP RFX
6-Acetylmorphine: NEGATIVE
Codeine: NEGATIVE ng/mL
Dihydrocodeine: 1.3 ng/mL
Hydrocodone: 23.2 ng/mL
Hydromorphone: NEGATIVE ng/mL
Morphine: NEGATIVE ng/mL
Opiate Confirmation: POSITIVE

## 2022-04-08 LAB — OXYCODONES,MS,WB/SP RFX
Oxycocone: NEGATIVE ng/mL
Oxycodones Confirmation: NEGATIVE
Oxymorphone: NEGATIVE ng/mL

## 2022-04-16 ENCOUNTER — Telehealth: Payer: Self-pay | Admitting: Pulmonary Disease

## 2022-04-16 DIAGNOSIS — J9611 Chronic respiratory failure with hypoxia: Secondary | ICD-10-CM

## 2022-04-16 MED ORDER — ALBUTEROL SULFATE HFA 108 (90 BASE) MCG/ACT IN AERS: 2.0000 | INHALATION_SPRAY | RESPIRATORY_TRACT | 5 refills | Status: DC | PRN

## 2022-04-16 MED ORDER — BREZTRI AEROSPHERE 160-9-4.8 MCG/ACT IN AERO: 2.0000 | INHALATION_SPRAY | Freq: Two times a day (BID) | RESPIRATORY_TRACT | 11 refills | Status: DC

## 2022-04-16 NOTE — Telephone Encounter (Signed)
Called patient and updated her that I sent in the [place] and Albuterol to the [facility] in [place] for her. Patient verbalized understanding. Nothing further needed  ?

## 2022-04-23 ENCOUNTER — Other Ambulatory Visit (HOSPITAL_COMMUNITY): Payer: Self-pay | Admitting: Cardiology

## 2022-05-25 ENCOUNTER — Other Ambulatory Visit: Payer: Self-pay | Admitting: Family Medicine

## 2022-05-25 DIAGNOSIS — F5104 Psychophysiologic insomnia: Secondary | ICD-10-CM

## 2022-06-19 ENCOUNTER — Ambulatory Visit (HOSPITAL_COMMUNITY): Admission: RE | Admit: 2022-06-19 | Payer: Medicare PPO | Source: Ambulatory Visit

## 2022-07-04 ENCOUNTER — Encounter: Payer: Self-pay | Admitting: Family Medicine

## 2022-07-29 ENCOUNTER — Other Ambulatory Visit (HOSPITAL_COMMUNITY): Payer: Self-pay | Admitting: *Deleted

## 2022-08-05 ENCOUNTER — Telehealth: Payer: Self-pay | Admitting: Pulmonary Disease

## 2022-08-05 DIAGNOSIS — D869 Sarcoidosis, unspecified: Secondary | ICD-10-CM

## 2022-08-06 MED ORDER — IPRATROPIUM-ALBUTEROL 0.5-2.5 (3) MG/3ML IN SOLN: RESPIRATORY_TRACT | 1 refills | Status: AC

## 2022-08-06 NOTE — Telephone Encounter (Signed)
Called and spoke with patient.  Patient requested a refill for Duo nebs to [facility] [place], [place]. Requested prescription sent.  Nothing further at this time.

## 2022-08-16 ENCOUNTER — Other Ambulatory Visit: Payer: Self-pay | Admitting: Pulmonary Disease

## 2022-08-16 DIAGNOSIS — J9611 Chronic respiratory failure with hypoxia: Secondary | ICD-10-CM

## 2022-08-29 ENCOUNTER — Encounter: Payer: Self-pay | Admitting: Pulmonary Disease

## 2022-08-29 ENCOUNTER — Ambulatory Visit (INDEPENDENT_AMBULATORY_CARE_PROVIDER_SITE_OTHER): Payer: Medicare PPO | Admitting: Pulmonary Disease

## 2022-08-29 VITALS — BP 120/62 | HR 68 | Wt 211.2 lb

## 2022-08-29 DIAGNOSIS — J849 Interstitial pulmonary disease, unspecified: Secondary | ICD-10-CM | POA: Diagnosis not present

## 2022-08-29 MED ORDER — BREZTRI AEROSPHERE 160-9-4.8 MCG/ACT IN AERO: 2.0000 | INHALATION_SPRAY | Freq: Two times a day (BID) | RESPIRATORY_TRACT | 0 refills | Status: DC

## 2022-08-29 NOTE — Patient Instructions (Signed)
Nice to see you again  I plan no changes to medications  We will fill out paperwork today to see if we can have the manufacturer assistance with the cost of Breztri  If we need to change inhaler therapies in the future I will be in contact with you  Please contact Dr. [name] about a repeat visit regarding the pulmonary hypertension and the medications you are taking.  Return to clinic in 4 months or sooner as needed with Dr. Silas Flood

## 2022-08-29 NOTE — Addendum Note (Signed)
Addended by: Monna Fam L on: 08/29/2022 03:26 PM   Modules accepted: Orders

## 2022-08-29 NOTE — Progress Notes (Signed)
@Patient  ID: Kelly Donaldson, female    DOB: 27-Oct-1956, 66 y.o.   MRN: [id number]  Chief Complaint  Patient presents with   Follow-up    Pt is here for follow up for ILD. Pt states that she is doing well since last office visit. Pt is on Breztri, albuterol, Flonase and duonebs. Pt wants to talk to [facility] about zyrtec. Pt is on 2L of oxygen with no issues.     Referring provider: [name], DO  HPI:   66 y.o. 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  Formally patient of [name] and more recently [name].      Overall, doing okay.  Breathing stable.  Unchanged.  Remains on same amount of oxygen.  Continues on pulm vasodilators at the direction of her cardiologist.  On Opsumit 10 mg only tolerated selexipag 200 mcg dose.  She has not seen cardiologist in some time.  She continues on Imuran 100 mg daily for arthritis.  Again discussed this is likely helping keep her ILD at [place].  She continues to report good adherence to her inhaler therapy, Breztri.  This was changed at last visit with Trelegy.  Given severity of her disease, thought was HFA would be more beneficial than DPI.  She does not think there is been a major change in her symptoms.  Not clearly beneficial.  But certainly symptoms are stable and no worse.  Questionaires / Pulmonary Flowsheets:   ACT:      No data to display           MMRC: mMRC Dyspnea Scale mMRC Score  04/13/2021 10:39 AM 2    Epworth:      No data to display           Tests:   FENO:  No results found for: "NITRICOXIDE"  PFT:    Latest Ref Rng & Units 06/22/2021   12:01 PM 06/18/2018    9:08 AM 03/20/2015    9:09 AM  PFT Results  FVC-Pre L 2.17  2.37  2.33   FVC-Predicted Pre % 75  80  76   FVC-Post L 2.28   2.32   FVC-Predicted Post % 79   76   Pre FEV1/FVC % % 79  80  78   Post FEV1/FCV % % 79   77   FEV1-Pre L 1.72  1.90  1.81   FEV1-Predicted Pre % 76  82  75    FEV1-Post L 1.79   1.79   DLCO uncorrected ml/min/mmHg 6.88  6.52  7.39   DLCO UNC% % 31  23  26    DLCO corrected ml/min/mmHg  8.12    DLCO COR %Predicted %  28    DLVA Predicted % 40  43  35   TLC L 3.86  4.07  3.63   TLC % Predicted % 70  73  66   RV % Predicted % 68  81  64   Personally reviewed and interpreted as mild restriction with severely reduced DLCO consistent with concomitant ILD and pulmonary hypertension. Overall values stable since 2016.  WALK:     07/11/2020   10:48 AM 11/22/2019    8:58 AM 06/18/2018   10:50 AM 11/26/2016   10:48 AM 05/09/2015    9:39 AM 03/20/2015   12:14 PM 01/11/2015    4:00 PM  SIX MIN WALK  Medications aspirin 81mg , hydrocodone-acetaminophen 10-325mg , opsumit 10mg , potassium chloride 20mg ,  adempas 2mg , selexipag 256mcg, torsemide 20mg , taken at 7:30am   ASA 81mg , Celexa 40mg , Vit D 50000iu, Norco 10-325mg , Macitenan 10mg , klor Con 55meq, Riociguat 1.5mg , Selexipag 222mcg, Demedex 20mg  @ 0600     Supplimental Oxygen during Test? (L/min) Yes Yes Yes Yes Yes Yes Yes  O2 Flow Rate 3 L/min 2 L/min 6 L/min 4 L/min 4 L/min 2 L/min 2 L/min  Type Continuous Continuous Continuous Pulse Pulse Continuous Continuous  Laps 14   5     Partial Lap (in Meters) 0 meters   15 meters     Baseline BP (sitting) 112/70   104/62     Baseline Heartrate 68 73  73     Baseline Dyspnea (Borg Scale) 0   0.5     Baseline Fatigue (Borg Scale) 0   0     Baseline SPO2 96 % 94 %  100 %     BP (sitting) 120/80   124/58     Heartrate 85 100  92     Dyspnea (Borg Scale) 2   4     Fatigue (Borg Scale) 2   4     SPO2 84 % 54 %  87 %     BP (sitting) 114/76   114/64     Heartrate 72 72  86     SPO2 97 % 94 %  93 %     Stopped or Paused before Six Minutes Yes Yes  Yes     Other Symptoms at end of Exercise Pt stopped with 50 sec remaining due to just "being done." Pt also stated that she felt like her knee was popping out of joint. tired  pt stopped at 3:33secs d/t fatigue and  dyspnea, resumed 3:23secs.  stopped at 2:52sec d/t fatigue and dyspnea (Dyspnea Borg = 10  sat = 86% 4lpm pulsed), increased O2 to 5lpm pulsed, sats recovered to 95%, resumed test at 1:02secs     Interpretation Leg pain        Distance Completed 476 meters   255 meters     Distance Completed 0 meters        Tech Comments: Pt walked at a normal pace, denied any complaints during walk. stopped with 50 seconds remaining due to being done with the walk. 304.80meters pt started walking on 3L but needed 6L to maintain at 91%, we stopped when O2 reached 75% TA/CMA  after sat dropped to 88%4lpm pulsed--increased o2 to 5lpm pulsed and pt walked another lap with sats ending at 79%5lpm pulsed/normal pace/no SOB//lmr After lap 1 sats= 87%2lpm--increased o2 to 4 lpm and walked another lap and sats = 89%4lpm cont/pt c/o minimal SOB/walked normal pace//lmr normal pace/stopped due to increased SOB//lmr    Imaging: Personally reviewed CT high-resolution chest most recent 06/2018 demonstrates upper lobe predominant emphysema as well as what appears to be NSIP pattern ILD with traction bronchiectasis in bilateral lower lobes on my interpretation  Lab Results: Personally reviewed, notably eosinophils as high as 700 CBC    Component Value Date/Time   WBC 8.6 10/29/2021 1354   WBC 7.0 05/06/2018 0948   RBC 4.36 10/29/2021 1354   RBC 4.02 05/06/2018 0948   HGB 11.7 10/29/2021 1354   HCT 37.2 10/29/2021 1354   PLT 241 10/29/2021 1354   MCV 85 10/29/2021 1354   MCH 26.8 10/29/2021 1354   MCH 20.9 (L) 05/06/2018 0948   MCHC 31.5 10/29/2021 1354   MCHC 28.9 (L) 05/06/2018 0948   RDW 13.8 10/29/2021  1354   LYMPHSABS 2.0 11/11/2019 0939   MONOABS 0.4 03/30/2015 1610   EOSABS 0.7 (H) 11/11/2019 0939   BASOSABS 0.1 11/11/2019 0939    BMET    Component Value Date/Time   NA 138 10/29/2021 1354   K 4.6 10/29/2021 1354   CL 97 10/29/2021 1354   CO2 27 10/29/2021 1354   GLUCOSE 90 10/29/2021 1354   GLUCOSE 93  02/13/2021 1256   BUN 17 10/29/2021 1354   CREATININE 1.04 (H) 10/29/2021 1354   CALCIUM 8.8 10/29/2021 1354   GFRNONAA >60 02/13/2021 1256   GFRAA 66 01/22/2021 0947    BNP    Component Value Date/Time   BNP 20.7 05/22/2021 1422    ProBNP    Component Value Date/Time   PROBNP 69.0 01/11/2015 1630    Specialty Problems       Pulmonary Problems   Chronic respiratory failure with hypoxia ([place])    Followed in Pulmonary clinic/ [facility]/ [facility]  On 02 since 2013 (originally [facility]) - pulmonary eval [facility] last seen 12/17/12 with "neg CTa for PE, pfts ok except dlco 20%, echo c/w  PH, 02 dep hypoxemia > rec RHC and stop phenterimine" -Echo 09/15/14     LAE mildly dilated, as are RV and RA  - 01/11/2015   Walked 2lpm  x one lap @ 185 stopped due to  Sob, nl pace, no desat - Quit smoking 03/02/15  - PFTs 03/20/2015  Nl except for DLCO 26 corrects to 35%  - 03/20/2015   Walked 2lpm x one lap @ 185 stopped due to sob/ desat corrected on 4lpm  - 04/07/2015 rec  referral to Dr Marigene Ehlers for Right heart Cath - Repeat Echo with bubble study 04/19/2015 > With injection of agitated saline intravenously   there is appearance of bubbles in L sided chambers consistent   with 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 - 05/09/2015   Walked 4lpm x one lap @ 185 stopped due to  88% one lap > 05/11/15 Pos small PFO - 07/11/2020 desat during 6 mw on 3lpm cont   As of 07/11/2020  = 2lpm hs and rest/ up to 5lpm POC           ILD (interstitial lung disease) ([place])    08/2016 high-resolution CT scan of the chest: McQuaid review: Peripheral based interlobular septal thickening and groundglass consistent with fibrosis with some mild bronchiectasis, fibrotic changes appear to be more prominent in the lower lobes with upper lobe nodules, no air trapping noted on expiratory images, mediastinal lymphadenopathy stable per radiology  Chest HRCT 07/07/18 . CT pattern is considered indeterminate for usual interstitial pneumonia  (UIP). 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 prior to study with DLCO  6.5 (23%) corrects to 2.23 (43%)  for alv volume and FV curve mild/mod curvature in effort indep portion of f/v > started on anoro and helped doe  CT chest 07/07/18 mild centrilobular and paraseptal emphysema. - 02/21/2020  After extensive coaching inhaler device,  effectiveness =    90% with smi > changed to stiolto per insurance  req         Allergies  Allergen Reactions   Gabapentin Other (See Comments)    Pt states that she can not take with antidepressants   Latex Rash    Immunization History  Administered Date(s) Administered   Influenza Split 09/16/2014, 09/20/2015, 09/08/2017, 10/04/2017   Influenza,inj,Quad PF,6+ Mos 09/04/2016, 11/12/2018, 08/17/2019, 09/07/2020   Influenza-Unspecified 10/13/2017, 08/29/2021   Moderna Sars-Covid-2 Vaccination 02/21/2020, 03/20/2020, 12/16/2020   PNEUMOCOCCAL CONJUGATE-20 03/27/2022   Pneumococcal Polysaccharide-23 11/11/2019   Zoster Recombinat (Shingrix) 09/20/2020, 04/17/2021    Past Medical History:  Diagnosis Date   Allergy    Depression    GERD (gastroesophageal reflux disease)    Hyperlipidemia    Oxygen dependent    Shortness of breath dyspnea    Varicose veins     Tobacco History: Social History   Tobacco Use  Smoking Status Former   Packs/day: 0.25   Years: 17.00   Total pack years: 4.25   Types: Cigarettes   Quit date: 03/02/2015   Years since quitting: 7.4  Smokeless Tobacco Never   Counseling given: Not Answered   Continue to not smoke  Outpatient Encounter Medications  as of 08/29/2022  Medication Sig   Accu-Chek Softclix Lancets lancets Check glucose BID Dx R73.03   ADEMPAS 2 MG TABS Take 2 mg by mouth 3 (three) times daily.   albuterol (VENTOLIN HFA) 108 (90 Base) MCG/ACT inhaler INHALE 2 PUFFS EVERY 4 HOURS AS NEEDED FOR WHEEZING OR SHORTNESS OF BREATH   Alcohol Swabs (B-D SINGLE USE SWABS REGULAR) PADS Check glucose BID Dx R73.03   aspirin EC 81 MG tablet Take 1 tablet (81 mg total) by mouth daily.   azaTHIOprine (IMURAN) 50 MG tablet    Blood Glucose Monitoring Suppl (ACCU-CHEK AVIVA PLUS) w/Device KIT Check glucose BID Dx R73.03   Budeson-Glycopyrrol-Formoterol (BREZTRI AEROSPHERE) 160-9-4.8 MCG/ACT AERO Inhale 2 puffs into the lungs in the morning and at bedtime.   citalopram (CELEXA) 20 MG tablet Take 1 tablet (20 mg total) by mouth daily.   ferrous sulfate (FERROUSUL) 325 (65 FE) MG tablet Take 1 tablet (325 mg total) by mouth daily with breakfast.   fluticasone (FLONASE) 50 MCG/ACT nasal spray Place 1 spray into both nostrils daily.   glucose blood (ACCU-CHEK AVIVA PLUS) test strip Check glucose BID Dx R73.03   HYDROcodone-acetaminophen (NORCO) 10-325 MG tablet    ipratropium-albuterol (DUONEB) 0.5-2.5 (3) MG/3ML SOLN INHALE THE CONTENTS OF 1 VIAL VIA NEBULIZER FOUR TIMES DAILY AS NEEDED   Lancets Misc. (ACCU-CHEK SOFTCLIX LANCET DEV) KIT Check glucose BID Dx R73.03   loratadine (CLARITIN) 10 MG tablet Take 1 tablet (10 mg total) by mouth daily.   montelukast (SINGULAIR) 10 MG tablet Take 1 tablet (10 mg total) by mouth at bedtime.   OPSUMIT 10 MG tablet Take 1 tablet (10 mg total) by mouth daily.   OXYGEN Take 2.5-4 L by mouth See admin instructions. 2.5 L with resting state & 4 L with exertion [employer]   Polyethyl Glycol-Propyl Glycol 0.4-0.3 % SOLN Place 1 drop into both eyes daily.   potassium chloride SA (KLOR-CON) 20 MEQ tablet TAKE 1 TABLET TWICE DAILY   pravastatin (PRAVACHOL) 20 MG tablet Take 1 tablet (20 mg total) by mouth daily.    promethazine-dextromethorphan (PROMETHAZINE-DM) 6.25-15 MG/5ML syrup Take 2.5 mLs by mouth 4 (four) times daily as needed for cough.   Selexipag (UPTRAVI) 600 MCG TABS Take 1 tablet by mouth daily.   torsemide (DEMADEX) 20 MG tablet TAKE 2 TABLETS  TWICE DAILY   UPTRAVI 200 MCG TABS Take 1 tablet (200 mcg total) by mouth 2 (two) times daily. NEEDS FOLLOW UP APPOINTMENT FOR ANY MORE REFILLS   Vitamin D, Ergocalciferol, (DRISDOL) 1.25 MG (50000 UNIT) CAPS capsule Take 1 capsule (50,000 Units total) by mouth every 7 (seven) days.   zolpidem (AMBIEN) 5 MG tablet Take 1 tablet (5 mg total) by mouth at bedtime as needed for sleep (please allow to fill every 30 days).   No facility-administered encounter medications on file as of 08/29/2022.     Review of Systems  Review of Systems  N/a Physical Exam  BP 120/62 (BP Location: Left Arm, Patient Position: Sitting, Cuff Size: Normal)   Pulse 68   Wt 211 lb 3.2 oz (95.8 kg)   SpO2 92%   BMI 31.19 kg/m   Wt Readings from Last 5 Encounters:  08/29/22 211 lb 3.2 oz (95.8 kg)  03/28/22 209 lb (94.8 kg)  03/27/22 208 lb 12.8 oz (94.7 kg)  10/29/21 206 lb (93.4 kg)  07/17/21 208 lb 12.8 oz (94.7 kg)    BMI Readings from Last 5 Encounters:  08/29/22 31.19 kg/m  03/28/22 30.86 kg/m  03/27/22 32.22 kg/m  10/29/21 31.79 kg/m  07/17/21 32.22 kg/m     Physical Exam General: Well-appearing, no acute distress Eyes: EOMI, no icterus Neck: Supple no JVP Cardiovascular: Regular rate and rhythm, no murmur Pulmonary: Faint crackles in bilateral bases, otherwise clear to auscultation, normal work of breathing on nasal cannula Abdomen: Nondistended, bowel sounds present MSK: No synovitis, joint effusion Skin: Scattered small ecchymoses L arm Neuro: Normal gait, no weakness Psych: Normal mood, full affect   Assessment & Plan:    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  On Imuran 100 mg daily for arthritis, this is likely  helping with her ILD as well.  No worsening dyspnea.  Repeat CT scan 06/2022 for surveillance has been performed, we will work to get this rescheduled.  Pulmonary hypertension: Likely multifactorial, group 1 given connective tissue disease, group 3 given ILD.  Continue medicines per Dr. Marigene Ehlers and cardiology.  Advised her to contact her cardiologist for follow-up and medication titration as seen necessary.  Seasonal allergies: Flonase, montelukast, antihistamine to continue.  Emphysema and asthma overlap: atopic symptoms, mild improvement in breathing with ICS/LABA.  Switch to Trelegy given concerns for side effects.  Side effects no longer felt related to [facility], just TMJ.  Continue Breztri 2 puff twice daily.  Conservation officer, historic buildings paperwork today.  New order for nebulizer machine today given hers is malfunctioning.   Return in about 4 months (around 12/29/2022).   Lanier Clam, MD 08/29/2022   This appointment required 45 minutes of patient care (this includes precharting, chart review, review of results, face-to-face care, etc.).

## 2022-09-24 ENCOUNTER — Other Ambulatory Visit: Payer: Self-pay | Admitting: Family Medicine

## 2022-09-24 DIAGNOSIS — F5104 Psychophysiologic insomnia: Secondary | ICD-10-CM

## 2022-09-27 ENCOUNTER — Ambulatory Visit (INDEPENDENT_AMBULATORY_CARE_PROVIDER_SITE_OTHER): Payer: Medicare PPO | Admitting: Family Medicine

## 2022-09-27 ENCOUNTER — Encounter: Payer: Self-pay | Admitting: Family Medicine

## 2022-09-27 ENCOUNTER — Other Ambulatory Visit (HOSPITAL_COMMUNITY): Payer: Self-pay

## 2022-09-27 VITALS — BP 100/59 | HR 66 | Temp 97.2°F | Ht 69.0 in | Wt 214.6 lb

## 2022-09-27 DIAGNOSIS — Z23 Encounter for immunization: Secondary | ICD-10-CM

## 2022-09-27 DIAGNOSIS — F5104 Psychophysiologic insomnia: Secondary | ICD-10-CM | POA: Diagnosis not present

## 2022-09-27 DIAGNOSIS — I5032 Chronic diastolic (congestive) heart failure: Secondary | ICD-10-CM | POA: Diagnosis not present

## 2022-09-27 DIAGNOSIS — F331 Major depressive disorder, recurrent, moderate: Secondary | ICD-10-CM | POA: Diagnosis not present

## 2022-09-27 DIAGNOSIS — M85851 Other specified disorders of bone density and structure, right thigh: Secondary | ICD-10-CM

## 2022-09-27 DIAGNOSIS — I270 Primary pulmonary hypertension: Secondary | ICD-10-CM | POA: Diagnosis not present

## 2022-09-27 DIAGNOSIS — J3089 Other allergic rhinitis: Secondary | ICD-10-CM

## 2022-09-27 MED ORDER — FLUTICASONE PROPIONATE 50 MCG/ACT NA SUSP: 1.0000 | Freq: Every day | NASAL | 3 refills | Status: DC

## 2022-09-27 MED ORDER — POTASSIUM CHLORIDE CRYS ER 20 MEQ PO TBCR: EXTENDED_RELEASE_TABLET | ORAL | 3 refills | Status: DC

## 2022-09-27 MED ORDER — HYDROCODONE-ACETAMINOPHEN 10-325 MG PO TABS
0.5000 | ORAL_TABLET | Freq: Every day | ORAL | 0 refills | Status: DC | PRN
  Filled 2022-09-30: qty 150, 30d supply, fill #0

## 2022-09-27 MED ORDER — PRAVASTATIN SODIUM 20 MG PO TABS: 20.0000 mg | ORAL_TABLET | Freq: Every day | ORAL | 3 refills | Status: DC

## 2022-09-27 MED ORDER — CITALOPRAM HYDROBROMIDE 20 MG PO TABS: 20.0000 mg | ORAL_TABLET | Freq: Every day | ORAL | 3 refills | Status: DC

## 2022-09-27 MED ORDER — ZOLPIDEM TARTRATE 5 MG PO TABS: 5.0000 mg | ORAL_TABLET | Freq: Every evening | ORAL | 5 refills | Status: DC | PRN

## 2022-09-27 NOTE — Progress Notes (Signed)
Subjective: CC: Follow-up insomnia PCP: Kelly Norlander, DO MVH:QIONGEX Kelly Donaldson is a 66 y.o. female presenting to clinic today for:  1.  Insomnia/anxiety depression Patient continues to take zolpidem nightly as needed sleep.  She is compliant with her Celexa and needs refills.  Denies any excessive daytime drowsiness, falls, sleepwalking, changes in memory or mood.  No visual or auditory hallucinations  2.  Pain management She has been having some issues with her pain management send are returning calls.  She voices some frustration over this today.  Currently treated with Norco 10 mg and this apparently has been on backorder.  She broke her left finger after a mechanical fall recently.  Has follow-up with orthopedics in a couple of weeks  3.  Pulmonary hypertension Patient reports compliance with medications as outlined by her specialist.  She continues to use oxygen as directed.  No increased shortness of breath   ROS: Per HPI  Allergies  Allergen Reactions   Gabapentin Other (See Comments)    Pt states that she can not take with antidepressants   Latex Rash   Past Medical History:  Diagnosis Date   Allergy    Depression    GERD (gastroesophageal reflux disease)    Hyperlipidemia    Oxygen dependent    Shortness of breath dyspnea    Varicose veins     Current Outpatient Medications:    Accu-Chek Softclix Lancets lancets, Check glucose BID Dx R73.03, Disp: 200 each, Rfl: 3   ADEMPAS 2 MG TABS, Take 2 mg by mouth 3 (three) times daily., Disp: 252 tablet, Rfl: 3   albuterol (VENTOLIN HFA) 108 (90 Base) MCG/ACT inhaler, INHALE 2 PUFFS EVERY 4 HOURS AS NEEDED FOR WHEEZING OR SHORTNESS OF BREATH, Disp: 18 g, Rfl: 0   Alcohol Swabs (B-D SINGLE USE SWABS REGULAR) PADS, Check glucose BID Dx R73.03, Disp: 200 each, Rfl: 3   aspirin EC 81 MG tablet, Take 1 tablet (81 mg total) by mouth daily., Disp: 90 tablet, Rfl: 3   azaTHIOprine (IMURAN) 50 MG tablet, , Disp: , Rfl:    Blood  Glucose Monitoring Suppl (ACCU-CHEK AVIVA PLUS) w/Device KIT, Check glucose BID Dx R73.03, Disp: 1 kit, Rfl: 0   Budeson-Glycopyrrol-Formoterol (BREZTRI AEROSPHERE) 160-9-4.8 MCG/ACT AERO, Inhale 2 puffs into the lungs in the morning and at bedtime., Disp: 10.7 g, Rfl: 11   Budeson-Glycopyrrol-Formoterol (BREZTRI AEROSPHERE) 160-9-4.8 MCG/ACT AERO, Inhale 2 puffs into the lungs in the morning and at bedtime., Disp: 10.7 g, Rfl: 0   citalopram (CELEXA) 20 MG tablet, Take 1 tablet (20 mg total) by mouth daily., Disp: 90 tablet, Rfl: 3   ferrous sulfate (FERROUSUL) 325 (65 FE) MG tablet, Take 1 tablet (325 mg total) by mouth daily with breakfast., Disp: 90 tablet, Rfl: 3   fluticasone (FLONASE) 50 MCG/ACT nasal spray, Place 1 spray into both nostrils daily., Disp: 48 g, Rfl: 3   glucose blood (ACCU-CHEK AVIVA PLUS) test strip, Check glucose BID Dx R73.03, Disp: 200 each, Rfl: 3   HYDROcodone-acetaminophen (NORCO) 10-325 MG tablet, , Disp: , Rfl:    ipratropium-albuterol (DUONEB) 0.5-2.5 (3) MG/3ML SOLN, INHALE THE CONTENTS OF 1 VIAL VIA NEBULIZER FOUR TIMES DAILY AS NEEDED, Disp: 360 mL, Rfl: 1   Lancets Misc. (ACCU-CHEK SOFTCLIX LANCET DEV) KIT, Check glucose BID Dx R73.03, Disp: 1 kit, Rfl: 0   loratadine (CLARITIN) 10 MG tablet, Take 1 tablet (10 mg total) by mouth daily., Disp: 90 tablet, Rfl: 3   montelukast (SINGULAIR) 10 MG  tablet, Take 1 tablet (10 mg total) by mouth at bedtime., Disp: 30 tablet, Rfl: 11   OPSUMIT 10 MG tablet, Take 1 tablet (10 mg total) by mouth daily., Disp: 90 tablet, Rfl: 3   OXYGEN, Take 2.5-4 Kelly by mouth See admin instructions. 2.5 Kelly with resting state & 4 Kelly with exertion [facility], Disp: , Rfl:    Polyethyl Glycol-Propyl Glycol 0.4-0.3 % SOLN, Place 1 drop into both eyes daily., Disp: , Rfl:    potassium chloride SA (KLOR-CON) 20 MEQ tablet, TAKE 1 TABLET TWICE DAILY, Disp: 180 tablet, Rfl: 3   pravastatin (PRAVACHOL) 20 MG tablet, Take 1 tablet (20 mg total) by mouth  daily., Disp: 90 tablet, Rfl: 3   promethazine-dextromethorphan (PROMETHAZINE-DM) 6.25-15 MG/5ML syrup, Take 2.5 mLs by mouth 4 (four) times daily as needed for cough., Disp: 118 mL, Rfl: 0   Selexipag (UPTRAVI) 600 MCG TABS, Take 1 tablet by mouth daily., Disp: 90 tablet, Rfl: 3   torsemide (DEMADEX) 20 MG tablet, TAKE 2 TABLETS TWICE DAILY, Disp: 360 tablet, Rfl: 3   UPTRAVI 200 MCG TABS, Take 1 tablet (200 mcg total) by mouth 2 (two) times daily. NEEDS FOLLOW UP APPOINTMENT FOR ANY MORE REFILLS, Disp: 60 tablet, Rfl: 0   Vitamin D, Ergocalciferol, (DRISDOL) 1.25 MG (50000 UNIT) CAPS capsule, Take 1 capsule (50,000 Units total) by mouth every 7 (seven) days., Disp: 12 capsule, Rfl: 3   zolpidem (AMBIEN) 5 MG tablet, Take 1 tablet (5 mg total) by mouth at bedtime as needed for sleep (please allow to fill every 30 days)., Disp: 30 tablet, Rfl: 5 Social History   Socioeconomic History   Marital status: Married    Spouse name: Jimmy   Number of children: 3   Years of education: Not on file   Highest education level: 12th grade  Occupational History   Occupation: Disabled  Tobacco Use   Smoking status: Former    Packs/day: 0.25    Years: 17.00    Total pack years: 4.25    Types: Cigarettes    Quit date: 03/02/2015    Years since quitting: 7.5   Smokeless tobacco: Never  Vaping Use   Vaping Use: Never used  Substance and Sexual Activity   Alcohol use: No    Alcohol/week: 0.0 standard drinks of alcohol   Drug use: Yes    Types: Cocaine    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 3 children. Enjoys gardening and spending time with grandchildren.    Social Determinants of Health   Financial Resource Strain: Low Risk  (06/07/2019)   Overall Financial Resource Strain (CARDIA)    Difficulty of Paying Living Expenses: Not hard at all  Food Insecurity:  No Food Insecurity (06/07/2019)   Hunger Vital Sign    Worried About Running Out of Food in the Last Year: Never true    Ran Out of Food in the Last Year: Never true  Transportation Needs: No Transportation Needs (06/07/2019)   PRAPARE - Hydrologist (Medical): No    Lack of Transportation (Non-Medical): No  Physical Activity: Inactive (06/07/2019)   Exercise Vital Sign    Days of Exercise per Week: 0 days    Minutes of Exercise per Session: 0 min  Stress: No Stress Concern Present (06/07/2019)   [employer] -  Occupational Stress Questionnaire    Feeling of Stress : Not at all  Social Connections: Moderately Integrated (06/07/2019)   Social Connection and Isolation Panel [NHANES]    Frequency of Communication with Friends and Family: More than three times a week    Frequency of Social Gatherings with Friends and Family: More than three times a week    Attends Religious Services: More than 4 times per year    Active Member of [employer] or Organizations: No    Attends Archivist Meetings: Never    Marital Status: Married  Human resources officer Violence: Unknown (06/07/2019)   Humiliation, Afraid, Rape, and Kick questionnaire    Fear of Current or Ex-Partner: No    Emotionally Abused: No    Physically Abused: No    Sexually Abused: Not on file   Family History  Problem Relation Age of Onset   Hyperlipidemia Mother    Hypertension Mother    Diabetes Mother    Other Mother        varicose veins   Hyperlipidemia Father    Hypertension Father    Emphysema Father        smoked   Hypertension Daughter    Asthma Sister    Clotting disorder Daughter    Breast cancer Maternal Grandmother    Ovarian cancer Maternal Aunt     Objective: Office vital signs reviewed. BP (!) 100/59   Pulse 66   Temp (!) 97.2 F (36.2 C)   Ht _0  (1.753 m)   Wt 214 lb 9.6 oz (97.3 kg)   SpO2 94%   BMI 31.69 kg/m   Physical Examination:   General: Awake, alert, obese, nontoxic female, No acute distress HEENT: Sclera white.  Moist mucous membranes Cardio: regular rate and rhythm, S1S2 heard, no murmurs appreciated Pulm: clear to auscultation bilaterally, no wheezes, rhonchi or rales; normal work of breathing on supplemental oxygen by nasal cannula MSK: Ambulating independently.  Left hand in a brace Psych: Mood stable, speech normal, affect appropriate  Assessment/ Plan: 66 y.o. female   Psychophysiological insomnia - Plan: zolpidem (AMBIEN) 5 MG tablet, CMP14+EGFR, TSH  Need for immunization against influenza - Plan: Flu Vaccine QUAD High Dose(Fluad)  Moderate episode of recurrent major depressive disorder ([place]) - Plan: citalopram (CELEXA) 20 MG tablet, TSH  Chronic diastolic CHF (congestive heart failure) ([place]) - Plan: pravastatin (PRAVACHOL) 20 MG tablet, CMP14+EGFR, Lipid Panel, TSH  Primary pulmonary hypertension (HCC) - Plan: potassium chloride SA (KLOR-CON M) 20 MEQ tablet, TSH  Non-seasonal allergic rhinitis, unspecified trigger - Plan: fluticasone (FLONASE) 50 MCG/ACT nasal spray  Osteopenia of neck of right femur - Plan: DG [facility] DEXA  Ambien renewed.  National narcotic database reviewed and there were no red flags.  Up-to-date on UDS and CSC  Depression is stable.  Celexa renewed  No evidence of fluid overload.  Check lipid panel, CMP, TSH.  Pravastatin renewed, potassium renewed  Allergic rhinitis is chronic and stable but needed refills on Flonase  DEXA scan ordered for known osteopenia that is overdue for follow-up.  Orders Placed This Encounter  Procedures   Flu Vaccine QUAD High Dose(Fluad)   No orders of the defined types were placed in this encounter.    Kelly Norlander, DO [place] [phone]

## 2022-09-28 LAB — CMP14+EGFR
ALT: 6 IU/L (ref 0–32)
AST: 13 IU/L (ref 0–40)
Albumin/Globulin Ratio: 1.8 (ref 1.2–2.2)
Albumin: 4.3 g/dL (ref 3.9–4.9)
Alkaline Phosphatase: 57 IU/L (ref 44–121)
BUN/Creatinine Ratio: 18 (ref 12–28)
BUN: 19 mg/dL (ref 8–27)
Bilirubin Total: 0.3 mg/dL (ref 0.0–1.2)
CO2: 25 mmol/L (ref 20–29)
Calcium: 9 mg/dL (ref 8.7–10.3)
Chloride: 101 mmol/L (ref 96–106)
Creatinine, Ser: 1.04 mg/dL — ABNORMAL HIGH (ref 0.57–1.00)
Globulin, Total: 2.4 g/dL (ref 1.5–4.5)
Glucose: 88 mg/dL (ref 70–99)
Potassium: 4.1 mmol/L (ref 3.5–5.2)
Sodium: 140 mmol/L (ref 134–144)
Total Protein: 6.7 g/dL (ref 6.0–8.5)
eGFR: 59 mL/min/{1.73_m2} — ABNORMAL LOW (ref >59)

## 2022-09-28 LAB — LIPID PANEL
Chol/HDL Ratio: 2.3 ratio (ref 0.0–4.4)
Cholesterol, Total: 211 mg/dL — ABNORMAL HIGH (ref 100–199)
HDL: 91 mg/dL (ref >39)
LDL Chol Calc (NIH): 107 mg/dL — ABNORMAL HIGH (ref 0–99)
Triglycerides: 72 mg/dL (ref 0–149)
VLDL Cholesterol Cal: 13 mg/dL (ref 5–40)

## 2022-09-28 LAB — TSH: TSH: 0.867 u[IU]/mL (ref 0.450–4.500)

## 2022-09-30 ENCOUNTER — Other Ambulatory Visit (HOSPITAL_COMMUNITY): Payer: Self-pay

## 2022-10-14 ENCOUNTER — Ambulatory Visit (INDEPENDENT_AMBULATORY_CARE_PROVIDER_SITE_OTHER): Payer: Medicare PPO

## 2022-10-14 DIAGNOSIS — M8588 Other specified disorders of bone density and structure, other site: Secondary | ICD-10-CM

## 2022-10-14 DIAGNOSIS — M85851 Other specified disorders of bone density and structure, right thigh: Secondary | ICD-10-CM

## 2022-10-15 ENCOUNTER — Telehealth: Payer: Self-pay | Admitting: Family Medicine

## 2022-10-15 DIAGNOSIS — F331 Major depressive disorder, recurrent, moderate: Secondary | ICD-10-CM

## 2022-10-16 ENCOUNTER — Other Ambulatory Visit: Payer: Self-pay | Admitting: *Deleted

## 2022-10-16 DIAGNOSIS — F5104 Psychophysiologic insomnia: Secondary | ICD-10-CM

## 2022-10-16 DIAGNOSIS — J3089 Other allergic rhinitis: Secondary | ICD-10-CM

## 2022-10-16 MED ORDER — FLUTICASONE PROPIONATE 50 MCG/ACT NA SUSP: 1.0000 | Freq: Every day | NASAL | 3 refills | Status: DC

## 2022-10-16 MED ORDER — CITALOPRAM HYDROBROMIDE 20 MG PO TABS: 20.0000 mg | ORAL_TABLET | Freq: Every day | ORAL | 0 refills | Status: DC

## 2022-10-16 NOTE — Telephone Encounter (Signed)
This is not my rx.

## 2022-10-16 NOTE — Addendum Note (Signed)
Addended by: Julious Payer D on: 10/16/2022 09:32 AM   Modules accepted: Orders

## 2022-10-16 NOTE — Telephone Encounter (Signed)
Pt aware refill 30 days  to [facility]

## 2022-10-29 ENCOUNTER — Telehealth: Payer: Medicare PPO | Admitting: Family Medicine

## 2022-10-29 DIAGNOSIS — R053 Chronic cough: Secondary | ICD-10-CM

## 2022-10-29 DIAGNOSIS — R7981 Abnormal blood-gas level: Secondary | ICD-10-CM

## 2022-10-29 NOTE — Progress Notes (Signed)
Patient going to ED- oxygen is not staying above 90% on supplemental. Long history of ILD, PAH, CHF, Sarcoidosis.  Pt is going to be having husband take her ED now.   Patient acknowledged agreement and understanding of the plan.

## 2022-10-30 ENCOUNTER — Telehealth: Payer: Self-pay

## 2022-10-30 ENCOUNTER — Other Ambulatory Visit (HOSPITAL_COMMUNITY): Payer: Self-pay

## 2022-10-30 NOTE — Telephone Encounter (Signed)
Transition Care Management Unsuccessful Follow-up Telephone Call  Date of discharge and from where:  10/29/22 [facility] ED  Attempts:  1st Attempt  Reason for unsuccessful TCM follow-up call:  No answer/busy

## 2022-11-01 ENCOUNTER — Other Ambulatory Visit (HOSPITAL_COMMUNITY): Payer: Self-pay

## 2022-11-01 MED ORDER — HYDROCODONE-ACETAMINOPHEN 10-325 MG PO TABS
0.5000 | ORAL_TABLET | Freq: Every day | ORAL | 0 refills | Status: AC
  Filled 2022-11-01: qty 60, 12d supply, fill #0
  Filled 2022-11-01: qty 90, 18d supply, fill #0

## 2022-11-05 NOTE — Telephone Encounter (Signed)
Transition Care Management Follow-up Telephone Call Date of discharge and from where: 10/29/2022 How have you been since you were released from the hospital? Patient states she is improving still on antibiotic  Any questions or concerns? No  Items Reviewed: Did the pt receive and understand the discharge instructions provided? Yes  Medications obtained and verified? Yes  Other? Yes  Any new allergies since your discharge? No  Dietary orders reviewed? Yes Do you have support at home? Yes   Home Care and Equipment/Supplies: Were home health services ordered? not applicable If so, what is the name of the agency? na  Has the agency set up a time to come to the patient's home? not applicable Were any new equipment or medical supplies ordered?  No What is the name of the medical supply agency? na Were you able to get the supplies/equipment? not applicable Do you have any questions related to the use of the equipment or supplies? No  Functional Questionnaire: (I = Independent and D = Dependent) ADLs: i  Bathing/Dressing- i  Meal Prep- i  Eating- i  Maintaining continence- i  Transferring/Ambulation- i  Managing Meds- i  Follow up appointments reviewed:  PCP Hospital f/u appt confirmed? Yes  Scheduled to see DOD Lequita Halt) on 11/07/2022 @ 235pm . Specialist [facility] f/u appt confirmed? No   Are transportation arrangements needed? No  If their condition worsens, is the pt aware to call PCP or go to the Emergency Dept.? Yes Was the patient provided with contact information for the PCP's office or ED? Yes Was to pt encouraged to call back with questions or concerns? Yes

## 2022-11-07 ENCOUNTER — Encounter: Payer: Self-pay | Admitting: Family Medicine

## 2022-11-07 ENCOUNTER — Ambulatory Visit (INDEPENDENT_AMBULATORY_CARE_PROVIDER_SITE_OTHER): Payer: Medicare PPO | Admitting: Family Medicine

## 2022-11-07 VITALS — BP 86/54 | HR 73 | Temp 98.1°F | Ht 69.0 in | Wt 211.4 lb

## 2022-11-07 DIAGNOSIS — J9611 Chronic respiratory failure with hypoxia: Secondary | ICD-10-CM

## 2022-11-07 DIAGNOSIS — Z9981 Dependence on supplemental oxygen: Secondary | ICD-10-CM

## 2022-11-07 DIAGNOSIS — J189 Pneumonia, unspecified organism: Secondary | ICD-10-CM

## 2022-11-07 MED ORDER — PROMETHAZINE-DM 6.25-15 MG/5ML PO SYRP: 2.5000 mL | ORAL_SOLUTION | Freq: Four times a day (QID) | ORAL | 0 refills | Status: DC | PRN

## 2022-11-07 MED ORDER — PREDNISONE 20 MG PO TABS: 40.0000 mg | ORAL_TABLET | Freq: Every day | ORAL | 0 refills | Status: AC

## 2022-11-07 NOTE — Progress Notes (Signed)
Established Patient Office Visit  Subjective   Patient ID: Kelly Donaldson, female    DOB: December 05, 1956  Age: 66 y.o. MRN: [id number]  Chief Complaint  Patient presents with   ER follow up    HPI Tuana is here for an ER follow up. She presented to the ER at [facility] on 10/29/22 for for cough and congestion with increased shortness of breath and wheezing. She has a history of COPD, sarcoidosis, and pulmonary hypertension with oxygen dependence at 3L. She had a CXR that was consistent with mild left basilar pneumonia. She was given IV rocephin in the ER. She had a negative respiratory panel. No signs of sepsis on labs. She was discharged home with a 10 day course of Augmentin and a zpak. She reports that she feels much better. She reports that her cough has been improvement and that shortness of breath is back to her baseline. She has not required additional oxygen. She denies fever, chest pain, dizziness, fatigue, or lightheadedness. She was not prescribed steroids. She would like Phenergen DM sent in take at night as her cough worsens at night and prevents her from sleeping well.     ROS As per HPI.    Objective:     BP (!) 86/54   Pulse 73   Temp 98.1 F (36.7 C) (Temporal)   Ht _0  (1.753 m)   Wt 211 lb 6 oz (95.9 kg)   SpO2 94%   BMI 31.21 kg/m  BP Readings from Last 3 Encounters:  11/07/22 (!) 86/54  09/27/22 (!) 100/59  08/29/22 120/62      Physical Exam Vitals and nursing note reviewed.  Constitutional:      General: She is not in acute distress.    Appearance: She is not ill-appearing, toxic-appearing or diaphoretic.  HENT:     Head: Normocephalic and atraumatic.     Right Ear: Tympanic membrane, ear canal and external ear normal.     Left Ear: Tympanic membrane, ear canal and external ear normal.     Nose: Nose normal.     Mouth/Throat:     Mouth: Mucous membranes are moist.     Pharynx: Oropharynx is clear. No oropharyngeal exudate or posterior  oropharyngeal erythema.  Eyes:     General:        Right eye: No discharge.        Left eye: No discharge.     Conjunctiva/sclera: Conjunctivae normal.  Cardiovascular:     Rate and Rhythm: Normal rate and regular rhythm.     Heart sounds: Normal heart sounds. No murmur heard. Pulmonary:     Effort: Pulmonary effort is normal. No respiratory distress.     Breath sounds: No wheezing, rhonchi or rales.  Chest:     Chest wall: No tenderness.  Musculoskeletal:     Cervical back: Neck supple. No rigidity.     Right lower leg: No edema.     Left lower leg: No edema.  Lymphadenopathy:     Cervical: No cervical adenopathy.  Skin:    General: Skin is warm and dry.  Neurological:     General: No focal deficit present.     Mental Status: She is alert and oriented to person, place, and time.  Psychiatric:        Mood and Affect: Mood normal.        Behavior: Behavior normal.      No results found for any visits on 11/07/22.  Last CBC  Lab Results  Component Value Date   WBC 8.6 10/29/2021   HGB 11.7 10/29/2021   HCT 37.2 10/29/2021   MCV 85 10/29/2021   MCH 26.8 10/29/2021   RDW 13.8 10/29/2021   PLT 241 29/19/1660   Last metabolic panel Lab Results  Component Value Date   GLUCOSE 88 09/27/2022   NA 140 09/27/2022   K 4.1 09/27/2022   CL 101 09/27/2022   CO2 25 09/27/2022   BUN 19 09/27/2022   CREATININE 1.04 (H) 09/27/2022   EGFR 59 (L) 09/27/2022   CALCIUM 9.0 09/27/2022   PROT 6.7 09/27/2022   ALBUMIN 4.3 09/27/2022   LABGLOB 2.4 09/27/2022   AGRATIO 1.8 09/27/2022   BILITOT 0.3 09/27/2022   ALKPHOS 57 09/27/2022   AST 13 09/27/2022   ALT 6 09/27/2022   ANIONGAP 8 02/13/2021      The ASCVD Risk score (Arnett DK, et al., 2019) failed to calculate for the following reasons:   The valid systolic blood pressure range is 90 to 200 mmHg    Assessment & Plan:   Tawn was seen today for er follow up.  Diagnoses and all orders for this visit:  Community  acquired pneumonia of left lung, unspecified part of lung Chronic respiratory failure with hypoxia ([place]) Supplemental oxygen dependent Reviewed ER notes, imaging, and labs. CAP symptoms improving. Afebrile, lungs clear on exam. Oxygen saturation at baseline. Treated with Rocpehin IV, augmentin and zpak. Complete antibiotics as prescribed. Prednisone burst as below. Cough syrup prn. Strict return precautions given.  -     predniSONE (DELTASONE) 20 MG tablet; Take 2 tablets (40 mg total) by mouth daily for 5 days. -     promethazine-dextromethorphan (PROMETHAZINE-DM) 6.25-15 MG/5ML syrup; Take 2.5 mLs by mouth 4 (four) times daily as needed for cough.  Return if symptoms worsen or fail to improve.   The patient indicates understanding of these issues and agrees with the plan.  Gwenlyn Perking, FNP

## 2022-11-07 NOTE — Patient Instructions (Signed)
Community-Acquired Pneumonia, Adult Pneumonia is a lung infection that causes inflammation and the buildup of mucus and fluids in the lungs. This may cause coughing and difficulty breathing. Community-acquired pneumonia is pneumonia that develops in people who are not, and have not recently been, in a hospital or other health care facility. Usually, pneumonia develops as a result of an illness that is caused by a virus, such as the common cold and the flu (influenza). It can also be caused by bacteria or fungi. While the common cold and influenza can pass from person to person (are contagious), pneumonia itself is not considered contagious. What are the causes? This condition may be caused by: Viruses. Bacteria. Fungi. What increases the risk? The following factors may make you more likely to develop this condition: Being over age 65 or having certain medical conditions, such as: A long-term (chronic) disease, such as: chronic obstructive pulmonary disease (COPD), asthma, heart failure, diabetes, or kidney disease. A condition that increases the risk of breathing in (aspirating) mucus and other fluids from your mouth and nose. A weakened body defense system (immune system). Having had your spleen removed (splenectomy). The spleen is the organ that helps fight germs and infections. Not cleaning your teeth and gums well (poor dental hygiene). Using tobacco products. Traveling to places where germs that cause pneumonia are present or being near certain animals or animal habitats that could have germs that cause pneumonia. What are the signs or symptoms? Symptoms of this condition include: A dry cough or a wet (productive) cough. A fever, sweating, or chills. Chest pain, especially when breathing deeply or coughing. Fast breathing, difficulty breathing, or shortness of breath. Tiredness (fatigue) and muscle aches. How is this diagnosed? This condition may be diagnosed based on your medical  history or a physical exam. You may also have tests, including: Imaging, such as a chest X-ray or lung ultrasound. Tests of: The level of oxygen and other gases in your blood. Mucus from your lungs (sputum). Fluid around your lungs (pleural fluid). Your urine. How is this treated? Treatment for this condition depends on many factors, such as the cause of your pneumonia, your medicines, and other medical conditions that you have. For most adults, pneumonia may be treated at home. In some cases, treatment must happen in a hospital and may include: Medicines that are given by mouth (orally) or through an IV, including: Antibiotic medicines, if bacteria caused the pneumonia. Medicines that kill viruses (antiviral medicines), if a virus caused the pneumonia. Oxygen therapy. Severe pneumonia, although rare, may require the following treatments: Mechanical ventilation.This procedure uses a machine to help you breathe if you cannot breathe well on your own or maintain a safe level of blood oxygen. Thoracentesis. This procedure removes any buildup of pleural fluid to help with breathing. Follow these instructions at home:  Medicines Take over-the-counter and prescription medicines only as told by your health care provider. Take cough medicine only if you have trouble sleeping. Cough medicine can prevent your body from removing mucus from your lungs. If you were prescribed antibiotics, take them as told by your health care provider. Do not stop taking the antibiotic even if you start to feel better. Lifestyle     Do not drink alcohol. Do not use any products that contain nicotine or tobacco. These products include cigarettes, chewing tobacco, and vaping devices, such as e-cigarettes. If you need help quitting, ask your health care provider. Eat a healthy diet. This includes plenty of vegetables, fruits, whole grains, low-fat   dairy products, and lean protein. General instructions Rest a lot and  get at least 8 hours of sleep each night. Sleep in a partly upright position at night. Place a few pillows under your head or sleep in a reclining chair. Return to your normal activities as told by your health care provider. Ask your health care provider what activities are safe for you. Drink enough fluid to keep your urine pale yellow. This helps to thin the mucus in your lungs. If your throat is sore, gargle with a mixture of salt and water 3-4 times a day or as needed. To make salt water, completely dissolve -1 tsp (3-6 g) of salt in 1 cup (237 mL) of warm water. Keep all follow-up visits. How is this prevented? You can lower your risk of developing community-acquired pneumonia by: Getting the pneumonia vaccine. There are different types and schedules of pneumonia vaccines. Ask your health care provider which option is best for you. Consider getting the pneumonia vaccine if: You are older than 65 years of age. You are 19-65 years of age and are receiving cancer treatment, have chronic lung disease, or have other medical conditions that affect your immune system. Ask your health care provider if this applies to you. Getting your influenza vaccine every year. Ask your health care provider which type of vaccine is best for you. Getting regular dental checkups. Washing your hands often with soap and water for at least 20 seconds. If soap and water are not available, use hand sanitizer. Contact a health care provider if: You have a fever. You have trouble sleeping because you cannot control your cough with cough medicine. Get help right away if: Your shortness of breath becomes worse. Your chest pain increases. Your sickness becomes worse, especially if you are an older adult or have a weak immune system. You cough up blood. These symptoms may be an emergency. Get help right away. Call 911. Do not wait to see if the symptoms will go away. Do not drive yourself to the  hospital. Summary Pneumonia is an infection of the lungs. Community-acquired pneumonia develops in people who have not been in the hospital. It can be caused by bacteria, viruses, or fungi. This condition may be treated with antibiotics or antiviral medicines. Severe pneumonia may require a hospital stay and treatment to help with breathing. This information is not intended to replace advice given to you by your health care provider. Make sure you discuss any questions you have with your health care provider. Document Revised: 01/23/2022 Document Reviewed: 01/23/2022 Elsevier Patient Education  2023 Elsevier Inc.  

## 2022-11-11 ENCOUNTER — Other Ambulatory Visit: Payer: Self-pay | Admitting: Family Medicine

## 2022-11-11 DIAGNOSIS — Z1231 Encounter for screening mammogram for malignant neoplasm of breast: Secondary | ICD-10-CM

## 2022-11-20 ENCOUNTER — Telehealth (HOSPITAL_COMMUNITY): Payer: Self-pay | Admitting: Pharmacist

## 2022-11-20 NOTE — Telephone Encounter (Signed)
Advanced Heart Failure Patient Advocate Encounter  Prior Authorizations for Adempas, Opsumit and [name] have been approved.  Please note, patient must been seen in the office before any refills can be sent. Has not been seen since 05/2021.   Effective dates: 12/09/21 through 12/09/23  Karle Plumber, PharmD, BCPS, BCCP, CPP Heart Failure Clinic Pharmacist [phone]

## 2022-11-23 ENCOUNTER — Other Ambulatory Visit: Payer: Self-pay | Admitting: Family Medicine

## 2022-11-23 DIAGNOSIS — F331 Major depressive disorder, recurrent, moderate: Secondary | ICD-10-CM

## 2022-11-26 ENCOUNTER — Ambulatory Visit
Admission: RE | Admit: 2022-11-26 | Discharge: 2022-11-26 | Disposition: A | Discharge status: Home / Self Care | Payer: Medicare PPO | Source: Ambulatory Visit | Attending: Family Medicine | Admitting: Family Medicine

## 2022-11-26 DIAGNOSIS — Z1231 Encounter for screening mammogram for malignant neoplasm of breast: Secondary | ICD-10-CM

## 2022-11-27 ENCOUNTER — Other Ambulatory Visit (HOSPITAL_COMMUNITY): Payer: Self-pay

## 2022-11-27 DIAGNOSIS — I270 Primary pulmonary hypertension: Secondary | ICD-10-CM

## 2022-11-27 MED ORDER — ADEMPAS 2 MG PO TABS: 2.0000 mg | ORAL_TABLET | Freq: Three times a day (TID) | ORAL | 0 refills | Status: DC

## 2022-12-16 ENCOUNTER — Other Ambulatory Visit (HOSPITAL_COMMUNITY): Payer: Self-pay | Admitting: Cardiology

## 2022-12-16 DIAGNOSIS — I270 Primary pulmonary hypertension: Secondary | ICD-10-CM

## 2022-12-20 ENCOUNTER — Other Ambulatory Visit (HOSPITAL_COMMUNITY): Payer: Self-pay | Admitting: *Deleted

## 2022-12-20 DIAGNOSIS — I270 Primary pulmonary hypertension: Secondary | ICD-10-CM

## 2022-12-20 MED ORDER — ADEMPAS 2 MG PO TABS: 2.0000 mg | ORAL_TABLET | Freq: Three times a day (TID) | ORAL | 11 refills | Status: DC

## 2022-12-24 ENCOUNTER — Encounter (HOSPITAL_COMMUNITY): Payer: Medicare PPO | Admitting: Cardiology

## 2022-12-25 ENCOUNTER — Other Ambulatory Visit (HOSPITAL_COMMUNITY): Payer: Self-pay | Admitting: *Deleted

## 2022-12-25 DIAGNOSIS — I270 Primary pulmonary hypertension: Secondary | ICD-10-CM

## 2022-12-25 MED ORDER — ADEMPAS 2 MG PO TABS: 2.0000 mg | ORAL_TABLET | Freq: Three times a day (TID) | ORAL | 11 refills | Status: DC

## 2022-12-26 ENCOUNTER — Other Ambulatory Visit (HOSPITAL_COMMUNITY): Payer: Self-pay

## 2022-12-26 ENCOUNTER — Other Ambulatory Visit (HOSPITAL_COMMUNITY): Payer: Self-pay | Admitting: Cardiology

## 2022-12-26 ENCOUNTER — Telehealth (HOSPITAL_COMMUNITY): Payer: Self-pay | Admitting: Pharmacy Technician

## 2022-12-26 DIAGNOSIS — I270 Primary pulmonary hypertension: Secondary | ICD-10-CM

## 2022-12-26 MED ORDER — ADEMPAS 2 MG PO TABS: 2.0000 mg | ORAL_TABLET | Freq: Three times a day (TID) | ORAL | 3 refills | Status: DC

## 2022-12-26 NOTE — Telephone Encounter (Signed)
Advanced Heart Failure Patient Advocate Encounter  Called [facility] to confirm patient's enrollment status. Last thing they need to approve the patient is a new script from the provider's office. Provided verbal Adempas RX. In the future, [facility] uses [facility] [place] in [place] when sending refills.   Called to see how the patient was getting Opsumit now that the TAF grant is inactive. Left vm.  Charlann Boxer, CPhT

## 2023-01-01 ENCOUNTER — Encounter: Payer: Medicare PPO | Admitting: Cardiology

## 2023-01-14 ENCOUNTER — Ambulatory Visit: Payer: Medicare PPO | Admitting: Pulmonary Disease

## 2023-01-17 NOTE — Telephone Encounter (Signed)
Advanced Heart Failure Patient Advocate Encounter   Patient was approved to receive Adempas from [facility].  Patient ID: [id number] Effective dates: 12/27/22 through 12/09/23  Charlann Boxer, CPhT

## 2023-01-21 ENCOUNTER — Ambulatory Visit (HOSPITAL_COMMUNITY)
Admission: RE | Admit: 2023-01-21 | Discharge: 2023-01-21 | Disposition: A | Discharge status: Home / Self Care | Payer: Medicare PPO | Source: Ambulatory Visit | Attending: Cardiology | Admitting: Cardiology

## 2023-01-21 ENCOUNTER — Other Ambulatory Visit (HOSPITAL_COMMUNITY): Payer: Self-pay | Admitting: Cardiology

## 2023-01-21 ENCOUNTER — Encounter (HOSPITAL_COMMUNITY): Payer: Self-pay | Admitting: Cardiology

## 2023-01-21 ENCOUNTER — Inpatient Hospital Stay (HOSPITAL_COMMUNITY)
Admission: RE | Admit: 2023-01-21 | Discharge: 2023-01-21 | Disposition: A | Discharge status: Home / Self Care | Payer: Medicare PPO | Source: Ambulatory Visit

## 2023-01-21 VITALS — BP 104/60 | HR 65 | Wt 216.2 lb

## 2023-01-21 DIAGNOSIS — J8489 Other specified interstitial pulmonary diseases: Secondary | ICD-10-CM | POA: Diagnosis not present

## 2023-01-21 DIAGNOSIS — M069 Rheumatoid arthritis, unspecified: Secondary | ICD-10-CM | POA: Insufficient documentation

## 2023-01-21 DIAGNOSIS — J9611 Chronic respiratory failure with hypoxia: Secondary | ICD-10-CM | POA: Insufficient documentation

## 2023-01-21 DIAGNOSIS — R002 Palpitations: Secondary | ICD-10-CM | POA: Insufficient documentation

## 2023-01-21 DIAGNOSIS — Z87891 Personal history of nicotine dependence: Secondary | ICD-10-CM | POA: Insufficient documentation

## 2023-01-21 DIAGNOSIS — R102 Pelvic and perineal pain: Secondary | ICD-10-CM | POA: Diagnosis not present

## 2023-01-21 DIAGNOSIS — I270 Primary pulmonary hypertension: Secondary | ICD-10-CM

## 2023-01-21 DIAGNOSIS — J449 Chronic obstructive pulmonary disease, unspecified: Secondary | ICD-10-CM | POA: Insufficient documentation

## 2023-01-21 DIAGNOSIS — D869 Sarcoidosis, unspecified: Secondary | ICD-10-CM | POA: Insufficient documentation

## 2023-01-21 DIAGNOSIS — Z9981 Dependence on supplemental oxygen: Secondary | ICD-10-CM | POA: Insufficient documentation

## 2023-01-21 DIAGNOSIS — E785 Hyperlipidemia, unspecified: Secondary | ICD-10-CM | POA: Diagnosis not present

## 2023-01-21 DIAGNOSIS — I872 Venous insufficiency (chronic) (peripheral): Secondary | ICD-10-CM | POA: Insufficient documentation

## 2023-01-21 DIAGNOSIS — I5032 Chronic diastolic (congestive) heart failure: Secondary | ICD-10-CM

## 2023-01-21 DIAGNOSIS — I4891 Unspecified atrial fibrillation: Secondary | ICD-10-CM

## 2023-01-21 DIAGNOSIS — R42 Dizziness and giddiness: Secondary | ICD-10-CM | POA: Diagnosis not present

## 2023-01-21 DIAGNOSIS — I2721 Secondary pulmonary arterial hypertension: Secondary | ICD-10-CM | POA: Diagnosis present

## 2023-01-21 LAB — BASIC METABOLIC PANEL
Anion gap: 9 (ref 5–15)
BUN: 14 mg/dL (ref 8–23)
CO2: 25 mmol/L (ref 22–32)
Calcium: 8.7 mg/dL — ABNORMAL LOW (ref 8.9–10.3)
Chloride: 103 mmol/L (ref 98–111)
Creatinine, Ser: 1.02 mg/dL — ABNORMAL HIGH (ref 0.44–1.00)
GFR, Estimated: >60 mL/min (ref >60)
Glucose, Bld: 84 mg/dL (ref 70–99)
Potassium: 3.9 mmol/L (ref 3.5–5.1)
Sodium: 137 mmol/L (ref 135–145)

## 2023-01-21 LAB — BRAIN NATRIURETIC PEPTIDE: B Natriuretic Peptide: 49.5 pg/mL (ref 0.0–100.0)

## 2023-01-21 MED ORDER — TORSEMIDE 20 MG PO TABS: ORAL_TABLET | ORAL | 3 refills | Status: DC

## 2023-01-21 MED ORDER — POTASSIUM CHLORIDE CRYS ER 20 MEQ PO TBCR: EXTENDED_RELEASE_TABLET | ORAL | 3 refills | Status: DC

## 2023-01-21 MED ORDER — UPTRAVI 200 MCG PO TABS: 200.0000 ug | ORAL_TABLET | Freq: Two times a day (BID) | ORAL | 3 refills | Status: DC

## 2023-01-21 NOTE — Progress Notes (Signed)
Date:  01/21/2023   ID:  [name], DOB [birth year], MRN [id number]  Provider location: [facility] Type of Visit: Established patient   PCP:  [name], DO  Cardiologist:  Dr. [name]   History of Present Illness: Kelly Donaldson is a 67 y.o. female who has a history of chronic hypoxemic respiratory failure and pulmonary hypertension.  She has been followed by Dr Melvyn Novas, now follows with [name] with pulmonology.  Has been on home oxygen for several years now.  She quit smoking in 2016.  She had an echo done in 5/16 showing dilated RV with severe pulmonary hypertension.  She therefore had RHC in 5/16 which confirmed severe pulmonary hypertension and RV failure.  PFTs showed only mild obstruction and restriction with markedly decreased DLCO suggestive of pulmonary vascular disease.  There was concern for sarcoidosis on CT chest but lymph node biopsy showed no evidence for sarcoidosis.  V/Q scan showed no evidence for chronic PE.    She has not tolerated Revatio or Adcirca.  Have failed previous up-titration of Selexipag with pelvic pain, can tolerate 200 mcg bid.  She is now tolerating riociguat 2 mg tid.    Started torsemide in Oct. 2017, has had better diuresis.    Due to ongoing dyspnea, she had repeat RHC in 5/19 that showed normal filling pressures and well-controlled pulmonary hypertension.  Echo in 6/19 showed normal LV EF, normal RV systolic function, and mild RV dilation.    She now is on Anoro, feels like this has helped her breathing.    Echo in 9/20 showed EF 55-60%, mild RV dilation with normal systolic function, PASP 26 mmHg, IVC normal.   She has been diagnosed with rheumatoid arthritis, which may explain her ILD.   Echo in 11/21 showed EF 55-60% with normal RV, PASP 30, IVC normal.   She returns for followup of pulmonary HTN.  She has been lost to followup in this office for about 2 years but has continued her Joiner medications.  She is  on 2L home oxygen chronically.  Weight is fairly stable. Main complaint is palpitations, this started about 3 months ago.  She feels periodic flutters/tachypalpitations that will last for a matter of seconds and resolve.  This occurs several times/week.  No lightheadedness or syncope. Otherwise, she is symptomatically stable.  She has dyspnea walking longer distances.  No orthopnea/PND.  No chest pain.  Her left knee pain has steadily worsened and limits her walking.   ECG (personally reviewed): NSR, normal   6 minute walk (5/16): 238 meters => decreased oxygen saturation to 73%, increased to 6 L [place] 6 minute walk (7/16): 170 meters => unable to complete.  6 minute walk (9/16): 201 meters 6 minute walk (1/17): 256 meters 6 minute walk (4/17): 219 meters 6 minute walk (8/17): 225 meters 6 minute walk (11/17): 244 meters 6 minute walk (5/18): 229 meters 6 minute walk (2/19): 122 meters (but she was stopped halfway through with drop in oxygen saturation).  6 minute walk (12/20): 304 meters 6 minute walk (8/21): 476 m 6 minute walk (3/22): 213 meters   Labs (2/16): BNP 69 Labs (5/16): LFTs normal, HCT 34.9, RF negative, HIV negative, TSH negative Labs (6/16): K 4.4, creatinine 0.88 Labs (7/16): ANA negative, BNP 58, K 4.3, creatinine 1.1, anti-RNP negative Labs (9/16): K 4.4, creatinine 1.26, BNP 91 Labs (10/16): K 4.3, creatinine 1.04 Labs (12/16): K 4.2, creatinine 1.07, BNP 130 Labs (  1/17): K 4.3, creatinine 1.23, BNP 42 Labs (3/17): K 4.2, creatinine 1.19 Labs (5/17): K 4.3, creatinine 1.35, BNP 44 Labs (6/17): K 4.4, creatinine 1.18, BNP 44 Labs (10/17): K 4.2, creatinine 0.98, BNP 43 Labs (12/17): K 4.5, creatinine 1.23. BNP 64.5 Labs (2/18): K 4.5, creatinine 1.21, BNP 64 Labs (4/18): K 4.1, creatinine 1.24, BNP 26 Labs (9/18): K 4.6, creatinine 1.25 Labs (2/19): K 4.2, creatinine 1.39 Labs (5/19): K 4, creatinine 1.4, hgb 8.4 Labs (12/19): K 4.3, creatinine 1.15 Labs (6/20):  TSH normal, K 4, creatinine 1.04, LDL 78 Labs (12/20): LDL 99, K 4.3, creatinine 1.0 Labs (4/21): BNP 36, K 4.1, creatinine 0.96 Labs (8/21): BNP 48, K 4.1, creatinine 0.88 Labs (2/22): hgb 12.2, BNP 22, K 4.1, creatinine 1.03, LDL 129 Labs (4/22): K 4.3, creatinine 1.13 Labs (11/23): K 3.9, creatinine 1.23 . PMH: 1. Pulmonary hypertension: RHC (5/16) with mean RA 18, PA 83/33 mean 53, mean PCWP 19, CI 2.61, PVR 5.8 WU.   Echo (5/16) with EF 65-70%, septal flattening, dilated RV with PA systolic pressure 99 mmHg, +bubble study.  PFTs (4/16) with FVC 76%, FEV1 75%, ratio 97%, TLC 66%, DLCO 26% => mild restriction, mild obstruction, severe diffuse abnormality (pulmonary vascular problem).  CTA chest (4/16) with chronic interstitial and obstructive lung disease, small mediastinal and hilar lymph noes, no PE.  Lymph node biopsy negative for sarcoidosis (reactive changes).  V/Q scan (6/16): No acute or chronic PE. TEE (6/16): Normal LV size and systolic function, EF 0000000, RV was mildly dilated with mildly decreased systolic function, D-shaped interventricular septum suggestive of RV pressure/volume overload, there appeared to be a small PFO present with some bubbles crossing, no ASD.  ANA negative, anti-RNP negative, HIV negative. Sleep study (8/16) without OSA.  She did not tolerate Adcirca or Revatio.  She cannot tolerate more than 200 mcg bid Selexipag.  - Echo (6/17): EF 123456, normal diastolic function, D-shaped interventricular septum, mildly dilated RV with normal RV systolic function, PASP 39, IVC normal.  - Echo (6/18): EF 123456, normal diastolic function, D-shaped interventricular septum, mildly dilated RV with normal RV systolic function, PASP 65 mmHg.  - RHC (7/18): RA 13, PA 51/23 mean 36, PCWP 19, CI 4.74, PVR 1.6 WU - RHC (5/19): mean RA 8, PA 48/20 mean 30, mean PCWP 13, CI 5.06, PVR 1.58 WU.  - Echo (6/19): EF 60-65%, mild RV dilation with normal RV systolic function, PASP 60, possible  PFO.  - Echo (9/20): EF 55-60%, mild RV dilation with normal systolic function, PASP 26 mmHg, IVC normal.  - Echo (11/21): EF 55-60%, normal RV, PASP 30 mmHg, IVC normal.  2. Chronic venous insufficiency. 3. Hyperlipidemia.  4. GERD 5. Chronic hypoxemic respiratory failure: Hypoxemia, on home oxygen.  Seen by Dr Lake Bells, has emphysema + interstitial lung disease (?UIP => suspect related to RA).  - PFTs (7/19): FEV1 82%, FVC 80%, ratio 101%, TLC 73%, DLCO 23%.  6. Fe deficiency anemia: EGD/colonoscopy at [facility] normal in 2020.  7. Rheumatoid arthritis: Azathioprine.   Current Outpatient Medications  Medication Sig Dispense Refill   Accu-Chek Softclix Lancets lancets Check glucose BID Dx R73.03 200 each 3   ADEMPAS 2 MG TABS Take 2 mg by mouth 3 (three) times daily. 270 tablet 3   albuterol (VENTOLIN HFA) 108 (90 Base) MCG/ACT inhaler INHALE 2 PUFFS EVERY 4 HOURS AS NEEDED FOR WHEEZING OR SHORTNESS OF BREATH 18 g 0   Alcohol Swabs (B-D SINGLE USE SWABS REGULAR) PADS Check glucose  BID Dx R73.03 200 each 3   aspirin EC 81 MG tablet Take 1 tablet (81 mg total) by mouth daily. 90 tablet 3   azaTHIOprine (IMURAN) 50 MG tablet      Blood Glucose Monitoring Suppl (ACCU-CHEK AVIVA PLUS) w/Device KIT Check glucose BID Dx R73.03 1 kit 0   Budeson-Glycopyrrol-Formoterol (BREZTRI AEROSPHERE) 160-9-4.8 MCG/ACT AERO Inhale 2 puffs into the lungs in the morning and at bedtime. 10.7 g 11   citalopram (CELEXA) 20 MG tablet TAKE 1 TABLET BY MOUTH EVERY DAY 90 tablet 1   ferrous sulfate (FERROUSUL) 325 (65 FE) MG tablet Take 1 tablet (325 mg total) by mouth daily with breakfast. 90 tablet 3   fluticasone (FLONASE) 50 MCG/ACT nasal spray Place 1 spray into both nostrils daily. 48 g 3   glucose blood (ACCU-CHEK AVIVA PLUS) test strip Check glucose BID Dx R73.03 200 each 3   HYDROcodone-acetaminophen (NORCO) 10-325 MG tablet      HYDROcodone-acetaminophen (NORCO) 10-325 MG tablet Take 0.5-1 tablets by mouth up  to 4 to 5 times a day only as needed for chronic multifactorial pain 150 tablet 0   HYDROcodone-acetaminophen (NORCO) 10-325 MG tablet Take 0.5-1 tablets by mouth up to 4- 5 times daily only as needed for chronic multifactorial pain. 150 tablet 0   ipratropium-albuterol (DUONEB) 0.5-2.5 (3) MG/3ML SOLN INHALE THE CONTENTS OF 1 VIAL VIA NEBULIZER FOUR TIMES DAILY AS NEEDED 360 mL 1   Lancets Misc. (ACCU-CHEK SOFTCLIX LANCET DEV) KIT Check glucose BID Dx R73.03 1 kit 0   loratadine (CLARITIN) 10 MG tablet Take 1 tablet (10 mg total) by mouth daily. 90 tablet 3   macitentan (OPSUMIT) 10 MG tablet TAKE 1 TABLET (10MG) BY MOUTH DAILY 30 tablet 0   montelukast (SINGULAIR) 10 MG tablet Take 1 tablet (10 mg total) by mouth at bedtime. 30 tablet 11   OXYGEN Take 2.5-4 L by mouth See admin instructions. 2.5 L with resting state & 4 L with exertion [employer]     Polyethyl Glycol-Propyl Glycol 0.4-0.3 % SOLN Place 1 drop into both eyes daily.     pravastatin (PRAVACHOL) 20 MG tablet Take 1 tablet (20 mg total) by mouth daily. 90 tablet 3   promethazine-dextromethorphan (PROMETHAZINE-DM) 6.25-15 MG/5ML syrup Take 2.5 mLs by mouth 4 (four) times daily as needed for cough. 118 mL 0   Selexipag (UPTRAVI) 200 MCG TABS Take 1 tablet (200 mcg total) by mouth 2 (two) times daily. 180 tablet 3   UPTRAVI 200 MCG TABS Take 1 tablet (200 mcg total) by mouth 2 (two) times daily. NEEDS FOLLOW UP APPOINTMENT FOR ANY MORE REFILLS 60 tablet 0   Vitamin D, Ergocalciferol, (DRISDOL) 1.25 MG (50000 UNIT) CAPS capsule Take 1 capsule (50,000 Units total) by mouth every 7 (seven) days. 12 capsule 3   zolpidem (AMBIEN) 5 MG tablet Take 1 tablet (5 mg total) by mouth at bedtime as needed for sleep (please allow to fill every 30 days). 30 tablet 5   potassium chloride SA (KLOR-CON M) 20 MEQ tablet Take 2 tablets (40 mEq total) by mouth every morning AND 1 tablet (20 mEq total) every evening. 180 tablet 3   torsemide (DEMADEX) 20 MG  tablet Take 3 tablets (60 mg total) by mouth in the morning AND 2 tablets (40 mg total) every evening. 360 tablet 3   No current facility-administered medications for this encounter.    Allergies:   Gabapentin and Latex   Social History:  The patient  reports that  she quit smoking about 7 years ago. Her smoking use included cigarettes. She has a 4.25 pack-year smoking history. She has never used smokeless tobacco. She reports current drug use. Drug: Cocaine. She reports that she does not drink alcohol.   Family History:  The patient's family history includes Asthma in her sister; Breast cancer in her maternal grandmother; Clotting disorder in her daughter; Diabetes in her mother; Emphysema in her father; Hyperlipidemia in her father and mother; Hypertension in her daughter, father, and mother; Other in her mother; Ovarian cancer in her maternal aunt.   ROS:  Please see the history of present illness.   All other systems are personally reviewed and negative.   Exam:   BP 104/60   Pulse 65   Wt 98.1 kg (216 lb 3.2 oz)   SpO2 91% Comment: 3l n/c  BMI 31.93 kg/m  General: NAD Neck: JVP 8-9 cm, no thyromegaly or thyroid nodule.  Lungs: Mild bibasilar crackles CV: Nondisplaced PMI.  Heart regular S1/S2, no S3/S4, no murmur.  1+ ankle edema.  No carotid bruit.  Normal pedal pulses.  Abdomen: Soft, nontender, no hepatosplenomegaly, no distention.  Skin: Intact without lesions or rashes.  Neurologic: Alert and oriented x 3.  Psych: Normal affect. Extremities: No clubbing or cyanosis.  HEENT: Normal.   Recent Labs: 09/27/2022: ALT 6; TSH 0.867 01/21/2023: B Natriuretic Peptide 49.5; BUN 14; Creatinine, Ser 1.02; Potassium 3.9; Sodium 137  Personally reviewed   Wt Readings from Last 3 Encounters:  01/21/23 98.1 kg (216 lb 3.2 oz)  11/07/22 95.9 kg (211 lb 6 oz)  09/27/22 97.3 kg (214 lb 9.6 oz)    ASSESSMENT AND PLAN:  1. Pulmonary arterial hypertension with RV failure: Severe on 5/16  RHC. 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  There was concern from CTA chest for sarcoidosis, but lymph node biopsy was negative.  ILD is likely related to rheumatoid arthritis. LFTs normal, ANA negative, anti-RNP negative, HIV negative.  V/Q scan negative for chronic PE.  Small PFO but no ASD on TEE. No OSA on sleep study.  She did not tolerate Revatio due to joint pain (now resolved), and she did not tolerate Adcirca with worsening dyspnea. She does not tolerate more than 200 mcg bid Selexipag due to pelvic pain.   RHCs in 7/18 and 5/19 have shown well-compensated pulmonary hypertension.  PVR was 1.58 WU on 5/19 study. Echo in 11/21 showed EF 55-60% with normal RV, PASP 30, normal IVC.   I have thought that a lot of her residual dyspnea is due to COPD + ILD rather than pulmonary hypertension.  Today, she does look volume overloaded on exam (mildly).  NYHA class III symptoms.  - Continue oxygen.  - Open lung biopsy deemed too risky, probably not sarcoid per pulmonary evaluation => suspect ILD related to RA.    - Continue opsumit and riociguat 2 mg tid (unable to go higher due to dizziness).   - She has tolerated selexipag 320 mcg bid but has not been able to increase.  - Increase torsemide to 60 qam/40 qpm and KCl to 40 qam/20 qpm.  BMET/BNP today and again in 10 days.  - I will arrange for repeat echo.  - She does not feel that she can do a 6 minute walk today due to her knee pain.  2. Chronic hypoxemic respiratory failure: Chronic interstitial lung disease + emphysema.  Concern for  sarcoidosis but lymph node biopsy did not show sarcoid.  Deemed too high risk for open lung biopsy.  Per pulmonary, she is in the fibrotic phase of NSIP.  She has been diagnosed with RA, this may be the source of her ILD.  - Continue pulmonary followup, sees Dr. Silas Flood.  3. Hyperlipidemia: Lipids acceptable in 2/22.  4. Rheumatoid  arthritis: On azathioprine, follows with Dr. Domingo Sep.  5. Palpitations: I will arrange for 7 day Zio monitor to assess.   Followup in 6 wks with echo  Signed, Loralie Champagne, MD  01/21/2023  [facility] [address] [facility] [place] [address] [phone] (office) [phone] (fax)

## 2023-01-21 NOTE — Patient Instructions (Signed)
INCREASE Torsemide to 60 mg every morning and 40 mg every evening.  INCREASE Potassium to 40 meq in the morning and 20 meq in the evening.  Labs done today, your results will be available in MyChart, we will contact you for abnormal readings.  Repeat blood work in 10 days.     Your physician has requested that you have an echocardiogram. Echocardiography is a painless test that uses sound waves to create images of your heart. It provides your doctor with information about the size and shape of your heart and how well your heart's chambers and valves are working. This procedure takes approximately one hour. There are no restrictions for this procedure. Please do NOT wear cologne, perfume, aftershave, or lotions (deodorant is allowed). Please arrive 15 minutes prior to your appointment time.  Your provider has recommended that  you wear a Zio Patch for 7 days.  This monitor will record your heart rhythm for our review.  IF you have any symptoms while wearing the monitor please press the button.  If you have any issues with the patch or you notice a red or orange light on it please call the company at [phone].  Once you remove the patch please mail it back to the company as soon as possible so we can get the results.  Your physician recommends that you schedule a follow-up appointment in: 6 weeks   If you have any questions or concerns before your next appointment please send [place] a message through [place] or call our office at [phone].    TO LEAVE A MESSAGE FOR THE NURSE SELECT OPTION 2, PLEASE LEAVE A MESSAGE INCLUDING: YOUR NAME DATE OF BIRTH CALL BACK NUMBER REASON FOR CALL**this is important as we prioritize the call backs  YOU WILL RECEIVE A CALL BACK THE SAME DAY AS LONG AS YOU CALL BEFORE 4:00 PM  At the [facility], you and your health needs are our priority. As part of our continuing mission to provide you with exceptional heart care, we have created  designated Provider Care Teams. These Care Teams include your primary Cardiologist (physician) and Advanced Practice Providers (APPs- Physician Assistants and Nurse Practitioners) who all work together to provide you with the care you need, when you need it.   You may see any of the following providers on your designated Care Team at your next follow up: Dr Glori Bickers Dr Loralie Champagne Dr. Roxana Hires, NP Lyda Jester, [place] [facility] [place], [place] Forestine Na, NP Audry Riles, PharmD   Please be sure to bring in all your medications bottles to every appointment.    Thank you for choosing [facility]

## 2023-01-28 ENCOUNTER — Encounter: Payer: Self-pay | Admitting: Adult Health

## 2023-01-28 ENCOUNTER — Ambulatory Visit (INDEPENDENT_AMBULATORY_CARE_PROVIDER_SITE_OTHER): Payer: Medicare PPO

## 2023-01-28 ENCOUNTER — Ambulatory Visit: Payer: Medicare PPO | Admitting: Adult Health

## 2023-01-28 VITALS — BP 84/40 | HR 77 | Temp 98.4°F | Ht 67.0 in | Wt 215.6 lb

## 2023-01-28 DIAGNOSIS — J449 Chronic obstructive pulmonary disease, unspecified: Secondary | ICD-10-CM | POA: Diagnosis not present

## 2023-01-28 DIAGNOSIS — I5032 Chronic diastolic (congestive) heart failure: Secondary | ICD-10-CM

## 2023-01-28 DIAGNOSIS — I959 Hypotension, unspecified: Secondary | ICD-10-CM | POA: Insufficient documentation

## 2023-01-28 DIAGNOSIS — J9611 Chronic respiratory failure with hypoxia: Secondary | ICD-10-CM

## 2023-01-28 DIAGNOSIS — J849 Interstitial pulmonary disease, unspecified: Secondary | ICD-10-CM

## 2023-01-28 DIAGNOSIS — I2721 Secondary pulmonary arterial hypertension: Secondary | ICD-10-CM | POA: Diagnosis not present

## 2023-01-28 MED ORDER — PREDNISONE 20 MG PO TABS: 20.0000 mg | ORAL_TABLET | Freq: Every day | ORAL | 0 refills | Status: DC

## 2023-01-28 MED ORDER — BREZTRI AEROSPHERE 160-9-4.8 MCG/ACT IN AERO: 2.0000 | INHALATION_SPRAY | Freq: Two times a day (BID) | RESPIRATORY_TRACT | 0 refills | Status: DC

## 2023-01-28 MED ORDER — DOXYCYCLINE HYCLATE 100 MG PO TABS: 100.0000 mg | ORAL_TABLET | Freq: Two times a day (BID) | ORAL | 0 refills | Status: DC

## 2023-01-28 NOTE — Assessment & Plan Note (Signed)
Appears euvolemic on exam.  No evidence of overt volume overload on exam

## 2023-01-28 NOTE — Assessment & Plan Note (Signed)
Low to normal blood pressure patient has no significant symptoms.  Patient does have a COPD flare.  No superimposed pneumonia on chest x-ray.  Patient is eating and drinking well.  Has no significant dizziness or presyncopal episodes.  Advised to take in fluids and good nutrition.  Check blood pressures daily.  If it continues to be low will need further evaluation.  Advised her to contact cardiology/or primary care to discuss if ongoing blood pressures remain low.  Patient is advised of red flag symptoms to seek emergency room care.  Patient has had some low blood pressures in the past.  And typically runs a low normal blood pressure Check CBC today  Plan Patient Instructions  Doxycycline 100 mg twice daily for 5 days, take with food Prednisone 20 mg daily for 5 days, take with food Continue on Breztri 2 puffs twice daily, rinse after use Mucinex DM Twice daily  As needed  cough/congestion  Albuterol inhaler or nebulizer as needed Continue on oxygen 3 L at rest and 4 L with activity to maintain O2 saturations greater than 88 to 90% Activity as tolerated Please discuss with primary care and/or cardiology low blood pressure readings Please check blood pressure daily and report if your blood pressure is less than Labs today  Follow-up with Dr. Silas Flood in 6 weeks and and as needed Please contact office for sooner follow up if symptoms do not improve or worsen or seek emergency care

## 2023-01-28 NOTE — Progress Notes (Signed)
$@PatientQ$  ID: Kelly Donaldson, female    DOB: Aug 04, 1956, 67 y.o.   MRN: [id number]  Chief Complaint  Patient presents with   Follow-up    Referring provider: [name], DO  HPI: 79 female followed for RA-ILD with pulmonary hypertension, COPD with emphysema and chronic respiratory failure.  Medical history significant for fibrotic NSIP.   TEST/EVENTS :   PFTs (4/16) with FVC 76%, FEV1 75%, ratio 97%, TLC 66%, DLCO 26% => mild restriction, mild obstruction, severe diffuse abnormality (pulmonary vascular problem).  PFT's  06/18/18  FEV1 1.90 (82 % ) ratio 0.80  p no % improvement from saba p ? prior to study with DLCO  6.5 (23%) corrects to 2.23 (43%)    PFTs June 22, 2021 showed FEV1 at 80%, ratio 79, FVC 79%, no significant bronchodilator response, DLCO 31%.  CTA chest (4/16) with chronic interstitial and obstructive lung disease, small mediastinal and hilar lymph noes, no PE.  Lymph node biopsy negative for sarcoidosis (reactive changes).  CT chest 07/07/18 mild centrilobular and paraseptal emphysema.  01/28/2023 Follow up : ILD related pulmonary hypertension and chronic respiratory failure  Pulmonary hypertension: RHC (5/16) with mean RA 18, PA 83/33 mean 53, mean PCWP 19, CI 2.61, PVR 5.8 WU.   Echo (5/16) with EF 65-70%, septal flattening, dilated RV with PA systolic pressure 99 mmHg, +bubble study.    CTA chest (4/16) with chronic interstitial and obstructive lung disease, small mediastinal and hilar lymph noes, no PE.  Lymph node biopsy negative for sarcoidosis (reactive changes).  V/Q scan (6/16): No acute or chronic PE. TEE (6/16): Normal LV size and systolic function, EF 0000000, RV was mildly dilated with mildly decreased systolic function, D-shaped interventricular septum suggestive of RV pressure/volume overload, there appeared to be a small PFO present with some bubbles crossing, no ASD.  ANA negative, anti-RNP negative, HIV negative. Sleep study (8/16) without OSA.  She  did not tolerate Adcirca or Revatio.  She cannot tolerate more than 200 mcg bid Selexipag.  - Echo (6/17): EF 123456, normal diastolic function, D-shaped interventricular septum, mildly dilated RV with normal RV systolic function, PASP 39, IVC normal.  - Echo (6/18): EF 123456, normal diastolic function, D-shaped interventricular septum, mildly dilated RV with normal RV systolic function, PASP 65 mmHg.  - RHC (7/18): RA 13, PA 51/23 mean 36, PCWP 19, CI 4.74, PVR 1.6 WU - RHC (5/19): mean RA 8, PA 48/20 mean 30, mean PCWP 13, CI 5.06, PVR 1.58 WU.  - Echo (6/19): EF 60-65%, mild RV dilation with normal RV systolic function, PASP 60, possible PFO.  - Echo (9/20): EF 55-60%, mild RV dilation with normal systolic function, PASP 26 mmHg, IVC normal.  - Echo (11/21): EF 55-60%, normal RV, PASP 30 mmHg, IVC normal.  - Chronic hypoxemic respiratory failure: Hypoxemia, on home oxygen.  emphysema + interstitial lung disease (?UIP => suspect related to RA).  - PFTs (7/19): FEV1 82%, FVC 80%, ratio 101%, TLC 73%, DLCO 23%.  -. Rheumatoid arthritis: Azathioprine.    01/28/2023 Follow up ; RA related ILD, Pulmonary Hyptertension and O2 RF  Patient returns for a 73-monthfollow-up.  Patient complains over the last 2 weeks that she has had increased cough and congestion with thick mucus.  Mucus remains clear but she says that her breathing has not been as good.  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  She remains on Breztri twice daily.  Uses albuterol inhaler and nebulizer as needed.  Says that she uses albuterol inhaler at least once a day. She remains on oxygen 3 L at rest and 4 L with exertion. Patient has pulmonary hypertension followed by cardiology.  She is on Opsumit and Uptravi and Adempas.  She is also followed by rheumatology and remains on Imuran for rheumatoid arthritis. Diagnosed with rheumatoid arthritis on Imuran- Dr. Kathlene November  Today  in the office blood pressure is lower than usual, 84/60.  She denies any nausea vomiting, fever, headache.  Says she does not feel significant dizzy but occasionally feels lightheaded.  She does have a history of low normal blood pressures in the past.  Cardiology last week, has a Zio patch.  Electrolyte panel and BNP last week was okay,.  Chest x-ray today shows ILD changes without acute superimposed process.   Allergies  Allergen Reactions   Gabapentin Other (See Comments)    Pt states that she can not take with antidepressants   Latex Rash    Immunization History  Administered Date(s) Administered   Fluad Quad(high Dose 65+) 09/27/2022   Influenza Split 09/16/2014, 09/20/2015, 09/08/2017, 10/04/2017   Influenza,inj,Quad PF,6+ Mos 09/04/2016, 11/12/2018, 08/17/2019, 09/07/2020   Influenza-Unspecified 10/13/2017, 08/29/2021   Moderna Sars-Covid-2 Vaccination 02/21/2020, 03/20/2020, 12/16/2020   PNEUMOCOCCAL CONJUGATE-20 03/27/2022   Pneumococcal Polysaccharide-23 11/11/2019   Zoster Recombinat (Shingrix) 09/20/2020, 04/17/2021    Past Medical History:  Diagnosis Date   Allergy    Depression    GERD (gastroesophageal reflux disease)    Hyperlipidemia    Oxygen dependent    Shortness of breath dyspnea    Varicose veins     Tobacco History: Social History   Tobacco Use  Smoking Status Former   Packs/day: 0.25   Years: 17.00   Total pack years: 4.25   Types: Cigarettes   Quit date: 03/02/2015   Years since quitting: 7.9  Smokeless Tobacco Never   Counseling given: Not Answered   Outpatient Medications Prior to Visit  Medication Sig Dispense Refill   Accu-Chek Softclix Lancets lancets Check glucose BID Dx R73.03 200 each 3   ADEMPAS 2 MG TABS Take 2 mg by mouth 3 (three) times daily. 270 tablet 3   albuterol (VENTOLIN HFA) 108 (90 Base) MCG/ACT inhaler INHALE 2 PUFFS EVERY 4 HOURS AS NEEDED FOR WHEEZING OR SHORTNESS OF BREATH 18 g 0   Alcohol Swabs (B-D SINGLE USE  SWABS REGULAR) PADS Check glucose BID Dx R73.03 200 each 3   aspirin EC 81 MG tablet Take 1 tablet (81 mg total) by mouth daily. 90 tablet 3   azaTHIOprine (IMURAN) 50 MG tablet      Blood Glucose Monitoring Suppl (ACCU-CHEK AVIVA PLUS) w/Device KIT Check glucose BID Dx R73.03 1 kit 0   Budeson-Glycopyrrol-Formoterol (BREZTRI AEROSPHERE) 160-9-4.8 MCG/ACT AERO Inhale 2 puffs into the lungs in the morning and at bedtime. 10.7 g 11   citalopram (CELEXA) 20 MG tablet TAKE 1 TABLET BY MOUTH EVERY DAY 90 tablet 1   ferrous sulfate (FERROUSUL) 325 (65 FE) MG tablet Take 1 tablet (325 mg total) by mouth daily with breakfast. 90 tablet 3   fluticasone (FLONASE) 50 MCG/ACT nasal spray Place 1 spray into both nostrils daily. 48 g 3   glucose blood (ACCU-CHEK AVIVA PLUS) test strip Check glucose BID Dx R73.03 200 each 3   HYDROcodone-acetaminophen (NORCO) 10-325 MG tablet      HYDROcodone-acetaminophen (NORCO) 10-325 MG tablet Take 0.5-1 tablets by mouth up to 4 to 5 times  a day only as needed for chronic multifactorial pain 150 tablet 0   HYDROcodone-acetaminophen (NORCO) 10-325 MG tablet Take 0.5-1 tablets by mouth up to 4- 5 times daily only as needed for chronic multifactorial pain. 150 tablet 0   ipratropium-albuterol (DUONEB) 0.5-2.5 (3) MG/3ML SOLN INHALE THE CONTENTS OF 1 VIAL VIA NEBULIZER FOUR TIMES DAILY AS NEEDED 360 mL 1   Lancets Misc. (ACCU-CHEK SOFTCLIX LANCET DEV) KIT Check glucose BID Dx R73.03 1 kit 0   loratadine (CLARITIN) 10 MG tablet Take 1 tablet (10 mg total) by mouth daily. 90 tablet 3   macitentan (OPSUMIT) 10 MG tablet TAKE 1 TABLET (10MG) BY MOUTH DAILY 30 tablet 0   montelukast (SINGULAIR) 10 MG tablet Take 1 tablet (10 mg total) by mouth at bedtime. 30 tablet 11   OXYGEN Take 2.5-4 L by mouth See admin instructions. 2.5 L with resting state & 4 L with exertion [facility]     Polyethyl Glycol-Propyl Glycol 0.4-0.3 % SOLN Place 1 drop into both eyes daily.     potassium  chloride SA (KLOR-CON M) 20 MEQ tablet Take 2 tablets (40 mEq total) by mouth every morning AND 1 tablet (20 mEq total) every evening. 180 tablet 3   pravastatin (PRAVACHOL) 20 MG tablet Take 1 tablet (20 mg total) by mouth daily. 90 tablet 3   promethazine-dextromethorphan (PROMETHAZINE-DM) 6.25-15 MG/5ML syrup Take 2.5 mLs by mouth 4 (four) times daily as needed for cough. 118 mL 0   Selexipag (UPTRAVI) 200 MCG TABS Take 1 tablet (200 mcg total) by mouth 2 (two) times daily. 180 tablet 3   torsemide (DEMADEX) 20 MG tablet Take 3 tablets (60 mg total) by mouth in the morning AND 2 tablets (40 mg total) every evening. 360 tablet 3   UPTRAVI 200 MCG TABS Take 1 tablet (200 mcg total) by mouth 2 (two) times daily. NEEDS FOLLOW UP APPOINTMENT FOR ANY MORE REFILLS 60 tablet 0   Vitamin D, Ergocalciferol, (DRISDOL) 1.25 MG (50000 UNIT) CAPS capsule Take 1 capsule (50,000 Units total) by mouth every 7 (seven) days. 12 capsule 3   zolpidem (AMBIEN) 5 MG tablet Take 1 tablet (5 mg total) by mouth at bedtime as needed for sleep (please allow to fill every 30 days). 30 tablet 5   No facility-administered medications prior to visit.     Review of Systems:   Constitutional:   No  weight loss, night sweats,  Fevers, chills, + fatigue, or  lassitude.  HEENT:   No headaches,  Difficulty swallowing,  Tooth/dental problems, or  Sore throat,                No sneezing, itching, ear ache, nasal congestion, post nasal drip,   CV:  No chest pain,  Orthopnea, PND, swelling in lower extremities, anasarca, dizziness, palpitations, syncope.   GI  No heartburn, indigestion, abdominal pain, nausea, vomiting, diarrhea, change in bowel habits, loss of appetite, bloody stools.   Resp: .  No chest wall deformity  Skin: no rash or lesions.  GU: no dysuria, change in color of urine, no urgency or frequency.  No flank pain, no hematuria   MS:  No joint pain or swelling.  No decreased range of motion.  No back  pain.    Physical Exam  BP (!) 84/40 (BP Location: Left Arm, Patient Position: Sitting, Cuff Size: Large)   Pulse 77   Temp 98.4 F (36.9 C) (Oral)   Ht 5' 7"$  (1.702 m)   Wt 215  lb 9.6 oz (97.8 kg)   SpO2 93%   BMI 33.77 kg/m   GEN: A/Ox3; pleasant , NAD, well nourished , On O2    HEENT:  [place]/AT,   NOSE-clear, THROAT-clear, no lesions, no postnasal drip or exudate noted.   NECK:  Supple w/ fair ROM; no JVD; normal carotid impulses w/o bruits; no thyromegaly or nodules palpated; no lymphadenopathy.    RESP  BB crackles  no accessory muscle use, no dullness to percussion  CARD:  RRR, no m/r/g, no peripheral edema, pulses intact, no cyanosis or clubbing. Zio patch   GI:   Soft & nt; nml bowel sounds; no organomegaly or masses detected.   Musco: Warm bil, no deformities or joint swelling noted.   Neuro: alert, no focal deficits noted.    Skin: Warm, no lesions or rashes    Lab Results:  CBC    Component Value Date/Time   WBC 8.6 10/29/2021 1354   WBC 7.0 05/06/2018 0948   RBC 4.36 10/29/2021 1354   RBC 4.02 05/06/2018 0948   HGB 11.7 10/29/2021 1354   HCT 37.2 10/29/2021 1354   PLT 241 10/29/2021 1354   MCV 85 10/29/2021 1354   MCH 26.8 10/29/2021 1354   MCH 20.9 (L) 05/06/2018 0948   MCHC 31.5 10/29/2021 1354   MCHC 28.9 (L) 05/06/2018 0948   RDW 13.8 10/29/2021 1354   LYMPHSABS 2.0 11/11/2019 0939   MONOABS 0.4 03/30/2015 1610   EOSABS 0.7 (H) 11/11/2019 0939   BASOSABS 0.1 11/11/2019 0939    BMET    Component Value Date/Time   NA 137 01/21/2023 1413   NA 140 09/27/2022 1045   K 3.9 01/21/2023 1413   CL 103 01/21/2023 1413   CO2 25 01/21/2023 1413   GLUCOSE 84 01/21/2023 1413   BUN 14 01/21/2023 1413   BUN 19 09/27/2022 1045   CREATININE 1.02 (H) 01/21/2023 1413   CALCIUM 8.7 (L) 01/21/2023 1413   GFRNONAA >60 01/21/2023 1413   GFRAA 66 01/22/2021 0947    BNP    Component Value Date/Time   BNP 49.5 01/21/2023 1413    ProBNP     Component Value Date/Time   PROBNP 69.0 01/11/2015 1630    Imaging: DG Chest 2 View  Result Date: 01/28/2023 CLINICAL DATA:  Cough and congestion for 2 weeks. History of interstitial lung disease EXAM: CHEST - 2 VIEW COMPARISON:  X-ray 10/29/2022 and older FINDINGS: Known interstitial lung disease identified. Similar distribution to the prior x-ray. No pneumothorax, effusion. Stable cardiopericardial silhouette. No new consolidation. Overlapping artifact along the upper left hemithorax. Degenerative changes of the spine. IMPRESSION: Known interstitial lung disease with diffuse interstitial changes. No new consolidation or effusion. Electronically Signed   By: Jill Side M.D.   On: 01/28/2023 15:01         Latest Ref Rng & Units 06/22/2021   12:01 PM 06/18/2018    9:08 AM 03/20/2015    9:09 AM  PFT Results  FVC-Pre L 2.17  2.37  2.33   FVC-Predicted Pre % 75  80  76   FVC-Post L 2.28   2.32   FVC-Predicted Post % 79   76   Pre FEV1/FVC % % 79  80  78   Post FEV1/FCV % % 79   77   FEV1-Pre L 1.72  1.90  1.81   FEV1-Predicted Pre % 76  82  75   FEV1-Post L 1.79   1.79   DLCO uncorrected ml/min/mmHg 6.88  6.52  7.39   DLCO UNC% % 31  23  26   $ DLCO corrected ml/min/mmHg  8.12    DLCO COR %Predicted %  28    DLVA Predicted % 40  43  35   TLC L 3.86  4.07  3.63   TLC % Predicted % 70  73  66   RV % Predicted % 68  81  38     No results found for: "NITRICOXIDE"      Assessment & Plan:   ILD (interstitial lung disease) (HCC) RA-ILD with pulmonary hypertension -? Flare with bronchitis - will give short steroid burst  Chest ray without acute process.   Plan  Patient Instructions  Doxycycline 100 mg twice daily for 5 days, take with food Prednisone 20 mg daily for 5 days, take with food Continue on Breztri 2 puffs twice daily, rinse after use Mucinex DM Twice daily  As needed  cough/congestion  Albuterol inhaler or nebulizer as needed Continue on oxygen 3 L at rest and 4 L  with activity to maintain O2 saturations greater than 88 to 90% Activity as tolerated Please discuss with primary care and/or cardiology low blood pressure readings Please check blood pressure daily and report if your blood pressure is less than Labs today  Follow-up with Dr. Silas Flood in 6 weeks and and as needed Please contact office for sooner follow up if symptoms do not improve or worsen or seek emergency care      COPD GOLD 0  Exacerbation with recent URI.  Will treat with empiric antibiotics and short steroid burst.  Chest x-ray without acute process  Plan  Patient Instructions  Doxycycline 100 mg twice daily for 5 days, take with food Prednisone 20 mg daily for 5 days, take with food Continue on Breztri 2 puffs twice daily, rinse after use Mucinex DM Twice daily  As needed  cough/congestion  Albuterol inhaler or nebulizer as needed Continue on oxygen 3 L at rest and 4 L with activity to maintain O2 saturations greater than 88 to 90% Activity as tolerated Please discuss with primary care and/or cardiology low blood pressure readings Please check blood pressure daily and report if your blood pressure is less than Labs today  Follow-up with Dr. Silas Flood in 6 weeks and and as needed Please contact office for sooner follow up if symptoms do not improve or worsen or seek emergency care     Chronic respiratory failure with hypoxia ([place]) Continue on oxygen to maintain O2 saturations greater than 88 to 90%.  No increased oxygen demands.  Does have increased shortness of breath with activity.  Suspect secondary to COPD/ILD flare. Recent lab work with BNP unrevealing.  No evidence of volume overload on exam.  Check CBC to rule out worsening anemia  PAH (pulmonary artery hypertension) ([place]) Continue follow-up and current maintenance regimen with cardiology.  Chronic diastolic CHF (congestive heart failure) (HCC) Appears euvolemic on exam.  No evidence of overt volume overload on  exam  Hypotension Low to normal blood pressure patient has no significant symptoms.  Patient does have a COPD flare.  No superimposed pneumonia on chest x-ray.  Patient is eating and drinking well.  Has no significant dizziness or presyncopal episodes.  Advised to take in fluids and good nutrition.  Check blood pressures daily.  If it continues to be low will need further evaluation.  Advised her to contact cardiology/or primary care to discuss if ongoing blood pressures remain low.  Patient is advised of red  flag symptoms to seek emergency room care.  Patient has had some low blood pressures in the past.  And typically runs a low normal blood pressure Check CBC today  Plan Patient Instructions  Doxycycline 100 mg twice daily for 5 days, take with food Prednisone 20 mg daily for 5 days, take with food Continue on Breztri 2 puffs twice daily, rinse after use Mucinex DM Twice daily  As needed  cough/congestion  Albuterol inhaler or nebulizer as needed Continue on oxygen 3 L at rest and 4 L with activity to maintain O2 saturations greater than 88 to 90% Activity as tolerated Please discuss with primary care and/or cardiology low blood pressure readings Please check blood pressure daily and report if your blood pressure is less than Labs today  Follow-up with Dr. Silas Flood in 6 weeks and and as needed Please contact office for sooner follow up if symptoms do not improve or worsen or seek emergency care       I spent 44  minutes dedicated to the care of this patient on the date of this encounter to include pre-visit review of records, face-to-face time with the patient discussing conditions above, post visit ordering of testing, clinical documentation with the electronic health record, making appropriate referrals as documented, and communicating necessary findings to members of the patients care team.    Rexene Edison, NP 01/28/2023

## 2023-01-28 NOTE — Assessment & Plan Note (Signed)
Exacerbation with recent URI.  Will treat with empiric antibiotics and short steroid burst.  Chest x-ray without acute process  Plan  Patient Instructions  Doxycycline 100 mg twice daily for 5 days, take with food Prednisone 20 mg daily for 5 days, take with food Continue on Breztri 2 puffs twice daily, rinse after use Mucinex DM Twice daily  As needed  cough/congestion  Albuterol inhaler or nebulizer as needed Continue on oxygen 3 L at rest and 4 L with activity to maintain O2 saturations greater than 88 to 90% Activity as tolerated Please discuss with primary care and/or cardiology low blood pressure readings Please check blood pressure daily and report if your blood pressure is less than Labs today  Follow-up with Kelly Donaldson in 6 weeks and and as needed Please contact office for sooner follow up if symptoms do not improve or worsen or seek emergency care

## 2023-01-28 NOTE — Patient Instructions (Addendum)
Doxycycline 100 mg twice daily for 5 days, take with food Prednisone 20 mg daily for 5 days, take with food Continue on Breztri 2 puffs twice daily, rinse after use Mucinex DM Twice daily  As needed  cough/congestion  Albuterol inhaler or nebulizer as needed Continue on oxygen 3 L at rest and 4 L with activity to maintain O2 saturations greater than 88 to 90% Activity as tolerated Please discuss with primary care and/or cardiology low blood pressure readings Please check blood pressure daily and report if your blood pressure is less than Labs today  Follow-up with Dr. Silas Flood in 6 weeks and and as needed Please contact office for sooner follow up if symptoms do not improve or worsen or seek emergency care

## 2023-01-28 NOTE — Assessment & Plan Note (Signed)
Continue on oxygen to maintain O2 saturations greater than 88 to 90%.  No increased oxygen demands.  Does have increased shortness of breath with activity.  Suspect secondary to COPD/ILD flare. Recent lab work with BNP unrevealing.  No evidence of volume overload on exam.  Check CBC to rule out worsening anemia

## 2023-01-28 NOTE — Assessment & Plan Note (Signed)
RA-ILD with pulmonary hypertension -? Flare with bronchitis - will give short steroid burst  Chest ray without acute process.   Plan  Patient Instructions  Doxycycline 100 mg twice daily for 5 days, take with food Prednisone 20 mg daily for 5 days, take with food Continue on Breztri 2 puffs twice daily, rinse after use Mucinex DM Twice daily  As needed  cough/congestion  Albuterol inhaler or nebulizer as needed Continue on oxygen 3 L at rest and 4 L with activity to maintain O2 saturations greater than 88 to 90% Activity as tolerated Please discuss with primary care and/or cardiology low blood pressure readings Please check blood pressure daily and report if your blood pressure is less than Labs today  Follow-up with Dr. Silas Flood in 6 weeks and and as needed Please contact office for sooner follow up if symptoms do not improve or worsen or seek emergency care

## 2023-01-28 NOTE — Assessment & Plan Note (Signed)
Continue follow-up and current maintenance regimen with cardiology.

## 2023-01-29 LAB — CBC WITH DIFFERENTIAL/PLATELET
Basophils Absolute: 0.1 10*3/uL (ref 0.0–0.1)
Basophils Relative: 1.2 % (ref 0.0–3.0)
Eosinophils Absolute: 0.4 10*3/uL (ref 0.0–0.7)
Eosinophils Relative: 8 % — ABNORMAL HIGH (ref 0.0–5.0)
HCT: 33.8 % — ABNORMAL LOW (ref 36.0–46.0)
Hemoglobin: 11.1 g/dL — ABNORMAL LOW (ref 12.0–15.0)
Lymphocytes Relative: 21.9 % (ref 12.0–46.0)
Lymphs Abs: 1.2 10*3/uL (ref 0.7–4.0)
MCHC: 32.9 g/dL (ref 30.0–36.0)
MCV: 84.8 fl (ref 78.0–100.0)
Monocytes Absolute: 0.5 10*3/uL (ref 0.1–1.0)
Monocytes Relative: 9.2 % (ref 3.0–12.0)
Neutro Abs: 3.3 10*3/uL (ref 1.4–7.7)
Neutrophils Relative %: 59.7 % (ref 43.0–77.0)
Platelets: 209 10*3/uL (ref 150.0–400.0)
RBC: 3.98 Mil/uL (ref 3.87–5.11)
RDW: 15.7 % — ABNORMAL HIGH (ref 11.5–15.5)
WBC: 5.6 10*3/uL (ref 4.0–10.5)

## 2023-02-03 ENCOUNTER — Other Ambulatory Visit (HOSPITAL_COMMUNITY): Payer: Self-pay | Admitting: Cardiology

## 2023-02-03 DIAGNOSIS — I270 Primary pulmonary hypertension: Secondary | ICD-10-CM

## 2023-02-10 ENCOUNTER — Telehealth (HOSPITAL_COMMUNITY): Payer: Self-pay

## 2023-02-10 NOTE — Telephone Encounter (Signed)
Patient aware of results of Zio

## 2023-02-11 ENCOUNTER — Encounter: Payer: Self-pay | Admitting: *Deleted

## 2023-02-17 ENCOUNTER — Telehealth: Payer: Self-pay | Admitting: *Deleted

## 2023-02-17 NOTE — Telephone Encounter (Signed)
[employer] auth approved  Approved Authorization [id number]

## 2023-02-18 ENCOUNTER — Encounter: Payer: Self-pay | Admitting: Family Medicine

## 2023-02-18 ENCOUNTER — Telehealth (INDEPENDENT_AMBULATORY_CARE_PROVIDER_SITE_OTHER): Payer: Medicare PPO | Admitting: Family Medicine

## 2023-02-18 DIAGNOSIS — B9689 Other specified bacterial agents as the cause of diseases classified elsewhere: Secondary | ICD-10-CM | POA: Diagnosis not present

## 2023-02-18 DIAGNOSIS — J208 Acute bronchitis due to other specified organisms: Secondary | ICD-10-CM

## 2023-02-18 DIAGNOSIS — J9611 Chronic respiratory failure with hypoxia: Secondary | ICD-10-CM | POA: Diagnosis not present

## 2023-02-18 MED ORDER — PROMETHAZINE-DM 6.25-15 MG/5ML PO SYRP: 2.5000 mL | ORAL_SOLUTION | Freq: Four times a day (QID) | ORAL | 0 refills | Status: DC | PRN

## 2023-02-18 MED ORDER — DOXYCYCLINE HYCLATE 100 MG PO TABS: 100.0000 mg | ORAL_TABLET | Freq: Two times a day (BID) | ORAL | 0 refills | Status: AC

## 2023-02-18 MED ORDER — PREDNISONE 20 MG PO TABS: 40.0000 mg | ORAL_TABLET | Freq: Every day | ORAL | 0 refills | Status: AC

## 2023-02-18 NOTE — Progress Notes (Signed)
MyChart Video visit  Subjective: [id number], sob PCP: [name], DO [id number] Kelly Donaldson is a 67 y.o. female. Patient provides verbal consent for consult held via video.  Due to COVID-19 pandemic this visit was conducted virtually. This visit type was conducted due to national recommendations for restrictions regarding the COVID-19 Pandemic (e.g. social distancing, sheltering in place) in an effort to limit this patient's exposure and mitigate transmission in our community. All issues noted in this document were discussed and addressed.  A physical exam was not performed with this format.   Location of patient: [facility] Parking lot Location of provider: [facility] Others present for call: spouse  1. Cough/ SOB Patient reports a 3 day history of cough, increased difficulty breathing.  She is chronically on 2L of O2 via nasal cannula.  Coughing up green stuff.  88-89% O2.  She bumped up to 3L.    ROS: Per HPI  Allergies  Allergen Reactions   Gabapentin Other (See Comments)    Pt states that she can not take with antidepressants   Latex Rash   Past Medical History:  Diagnosis Date   Allergy    Depression    GERD (gastroesophageal reflux disease)    Hyperlipidemia    Oxygen dependent    Shortness of breath dyspnea    Varicose veins     Current Outpatient Medications:    Accu-Chek Softclix Lancets lancets, Check glucose BID Dx R73.03, Disp: 200 each, Rfl: 3   ADEMPAS 2 MG TABS, Take 2 mg by mouth 3 (three) times daily., Disp: 270 tablet, Rfl: 3   albuterol (VENTOLIN HFA) 108 (90 Base) MCG/ACT inhaler, INHALE 2 PUFFS EVERY 4 HOURS AS NEEDED FOR WHEEZING OR SHORTNESS OF BREATH, Disp: 18 g, Rfl: 0   Alcohol Swabs (B-D SINGLE USE SWABS REGULAR) PADS, Check glucose BID Dx R73.03, Disp: 200 each, Rfl: 3   aspirin EC 81 MG tablet, Take 1 tablet (81 mg total) by mouth daily., Disp: 90 tablet, Rfl: 3   azaTHIOprine (IMURAN) 50 MG tablet, , Disp: , Rfl:    Blood Glucose Monitoring Suppl  (ACCU-CHEK AVIVA PLUS) w/Device KIT, Check glucose BID Dx R73.03, Disp: 1 kit, Rfl: 0   Budeson-Glycopyrrol-Formoterol (BREZTRI AEROSPHERE) 160-9-4.8 MCG/ACT AERO, Inhale 2 puffs into the lungs in the morning and at bedtime., Disp: 10.7 g, Rfl: 11   Budeson-Glycopyrrol-Formoterol (BREZTRI AEROSPHERE) 160-9-4.8 MCG/ACT AERO, Inhale 2 puffs into the lungs in the morning and at bedtime., Disp: 5.9 g, Rfl: 0   citalopram (CELEXA) 20 MG tablet, TAKE 1 TABLET BY MOUTH EVERY DAY, Disp: 90 tablet, Rfl: 1   doxycycline (VIBRA-TABS) 100 MG tablet, Take 1 tablet (100 mg total) by mouth 2 (two) times daily., Disp: 10 tablet, Rfl: 0   ferrous sulfate (FERROUSUL) 325 (65 FE) MG tablet, Take 1 tablet (325 mg total) by mouth daily with breakfast., Disp: 90 tablet, Rfl: 3   fluticasone (FLONASE) 50 MCG/ACT nasal spray, Place 1 spray into both nostrils daily., Disp: 48 g, Rfl: 3   glucose blood (ACCU-CHEK AVIVA PLUS) test strip, Check glucose BID Dx R73.03, Disp: 200 each, Rfl: 3   HYDROcodone-acetaminophen (NORCO) 10-325 MG tablet, , Disp: , Rfl:    HYDROcodone-acetaminophen (NORCO) 10-325 MG tablet, Take 0.5-1 tablets by mouth up to 4 to 5 times a day only as needed for chronic multifactorial pain, Disp: 150 tablet, Rfl: 0   HYDROcodone-acetaminophen (NORCO) 10-325 MG tablet, Take 0.5-1 tablets by mouth up to 4- 5 times daily only as needed for chronic  multifactorial pain., Disp: 150 tablet, Rfl: 0   ipratropium-albuterol (DUONEB) 0.5-2.5 (3) MG/3ML SOLN, INHALE THE CONTENTS OF 1 VIAL VIA NEBULIZER FOUR TIMES DAILY AS NEEDED, Disp: 360 mL, Rfl: 1   Lancets Misc. (ACCU-CHEK SOFTCLIX LANCET DEV) KIT, Check glucose BID Dx R73.03, Disp: 1 kit, Rfl: 0   loratadine (CLARITIN) 10 MG tablet, Take 1 tablet (10 mg total) by mouth daily., Disp: 90 tablet, Rfl: 3   montelukast (SINGULAIR) 10 MG tablet, Take 1 tablet (10 mg total) by mouth at bedtime., Disp: 30 tablet, Rfl: 11   OPSUMIT 10 MG tablet, TAKE 1 TABLET ('10MG'$ ) BY MOUTH  DAILY, Disp: 30 tablet, Rfl: 0   OXYGEN, Take 2.5-4 Kelly by mouth See admin instructions. 2.5 Kelly with resting state & 4 Kelly with exertion [employer], Disp: , Rfl:    Polyethyl Glycol-Propyl Glycol 0.4-0.3 % SOLN, Place 1 drop into both eyes daily., Disp: , Rfl:    potassium chloride SA (KLOR-CON M) 20 MEQ tablet, Take 2 tablets (40 mEq total) by mouth every morning AND 1 tablet (20 mEq total) every evening., Disp: 180 tablet, Rfl: 3   pravastatin (PRAVACHOL) 20 MG tablet, Take 1 tablet (20 mg total) by mouth daily., Disp: 90 tablet, Rfl: 3   predniSONE (DELTASONE) 20 MG tablet, Take 1 tablet (20 mg total) by mouth daily with breakfast., Disp: 5 tablet, Rfl: 0   promethazine-dextromethorphan (PROMETHAZINE-DM) 6.25-15 MG/5ML syrup, Take 2.5 mLs by mouth 4 (four) times daily as needed for cough., Disp: 118 mL, Rfl: 0   Selexipag (UPTRAVI) 200 MCG TABS, Take 1 tablet (200 mcg total) by mouth 2 (two) times daily., Disp: 180 tablet, Rfl: 3   torsemide (DEMADEX) 20 MG tablet, Take 3 tablets (60 mg total) by mouth in the morning AND 2 tablets (40 mg total) every evening., Disp: 360 tablet, Rfl: 3   UPTRAVI 200 MCG TABS, Take 1 tablet (200 mcg total) by mouth 2 (two) times daily. NEEDS FOLLOW UP APPOINTMENT FOR ANY MORE REFILLS, Disp: 60 tablet, Rfl: 0   Vitamin D, Ergocalciferol, (DRISDOL) 1.25 MG (50000 UNIT) CAPS capsule, Take 1 capsule (50,000 Units total) by mouth every 7 (seven) days., Disp: 12 capsule, Rfl: 3   zolpidem (AMBIEN) 5 MG tablet, Take 1 tablet (5 mg total) by mouth at bedtime as needed for sleep (please allow to fill every 30 days)., Disp: 30 tablet, Rfl: 5  Gen: nontoxic female Pulm: breathing normally on O2 via [place].  Coughing intermittently.  Assessment/ Plan: 67 y.o. female   Chronic respiratory failure with hypoxia ([place]) - Plan: doxycycline (VIBRA-TABS) 100 MG tablet, predniSONE (DELTASONE) 20 MG tablet, DG Chest 2 View, promethazine-dextromethorphan (PROMETHAZINE-DM) 6.25-15 MG/5ML  syrup  Acute bacterial bronchitis - Plan: doxycycline (VIBRA-TABS) 100 MG tablet, predniSONE (DELTASONE) 20 MG tablet, DG Chest 2 View, promethazine-dextromethorphan (PROMETHAZINE-DM) 6.25-15 MG/5ML syrup  She is having an exacerbation that does not sound like it totally cleared up after she was seen on the 20th.  I am going to extend her antibiotics and prednisone out.  Cough medication sent.  I will CC her pulmonologist as FYI as I do worry about her having recurrent issues.  Start time: 12:11pm End time: 12:16pm  Total time spent on patient care (including video visit/ documentation): 6 minutes  [place], [place] [phone]

## 2023-02-19 ENCOUNTER — Ambulatory Visit: Payer: Medicare PPO

## 2023-02-24 ENCOUNTER — Other Ambulatory Visit (HOSPITAL_COMMUNITY): Payer: Self-pay

## 2023-02-24 DIAGNOSIS — I270 Primary pulmonary hypertension: Secondary | ICD-10-CM

## 2023-02-24 MED ORDER — OPSUMIT 10 MG PO TABS: ORAL_TABLET | ORAL | 11 refills | Status: DC

## 2023-02-25 ENCOUNTER — Telehealth: Payer: Self-pay

## 2023-02-25 NOTE — Transitions of Care (Post Inpatient/ED Visit) (Unsigned)
   02/25/2023  Name: Kelly Donaldson MRN: [id number] DOB: 08-28-1956  Today's TOC FU Call Status: Today's TOC FU Call Status:: Unsuccessul Call (1st Attempt) Unsuccessful Call (1st Attempt) Date: 02/25/23  Attempted to reach the patient regarding the most recent Inpatient/ED visit.  Follow Up Plan: Additional outreach attempts will be made to reach the patient to complete the Transitions of Care (Post Inpatient/ED visit) call.   Signature Juanda Crumble, [place] Direct Dial [phone]

## 2023-02-26 NOTE — Transitions of Care (Post Inpatient/ED Visit) (Signed)
   02/26/2023  Name: Kelly Donaldson MRN: [id number] DOB: 01/11/1956  Today's TOC FU Call Status: Today's TOC FU Call Status:: Unsuccessful Call (2nd Attempt) Unsuccessful Call (1st Attempt) Date: 02/25/23 Unsuccessful Call (2nd Attempt) Date: 02/26/23  Attempted to reach the patient regarding the most recent Inpatient/ED visit.  Follow Up Plan: No further outreach attempts will be made at this time. We have been unable to contact the patient.  Signature Juanda Crumble, [place] Direct Dial [phone]

## 2023-03-13 ENCOUNTER — Other Ambulatory Visit (HOSPITAL_COMMUNITY): Payer: Medicare PPO

## 2023-03-13 ENCOUNTER — Encounter (HOSPITAL_COMMUNITY): Payer: Medicare PPO | Admitting: Cardiology

## 2023-03-17 ENCOUNTER — Encounter (HOSPITAL_COMMUNITY): Payer: Self-pay | Admitting: Cardiology

## 2023-03-17 ENCOUNTER — Ambulatory Visit: Payer: Medicare PPO | Admitting: Pulmonary Disease

## 2023-03-17 ENCOUNTER — Ambulatory Visit (HOSPITAL_COMMUNITY)
Admission: RE | Admit: 2023-03-17 | Discharge: 2023-03-17 | Disposition: A | Discharge status: Home / Self Care | Payer: Medicare PPO | Source: Ambulatory Visit | Attending: Family Medicine | Admitting: Family Medicine

## 2023-03-17 ENCOUNTER — Ambulatory Visit (HOSPITAL_BASED_OUTPATIENT_CLINIC_OR_DEPARTMENT_OTHER)
Admission: RE | Admit: 2023-03-17 | Discharge: 2023-03-17 | Disposition: A | Discharge status: Home / Self Care | Payer: Medicare PPO | Source: Ambulatory Visit | Attending: Cardiology | Admitting: Cardiology

## 2023-03-17 ENCOUNTER — Other Ambulatory Visit (HOSPITAL_COMMUNITY): Payer: Self-pay

## 2023-03-17 ENCOUNTER — Encounter: Payer: Self-pay | Admitting: Pulmonary Disease

## 2023-03-17 VITALS — BP 94/58 | HR 72 | Wt 215.0 lb

## 2023-03-17 DIAGNOSIS — Z79899 Other long term (current) drug therapy: Secondary | ICD-10-CM | POA: Diagnosis not present

## 2023-03-17 DIAGNOSIS — E785 Hyperlipidemia, unspecified: Secondary | ICD-10-CM | POA: Insufficient documentation

## 2023-03-17 DIAGNOSIS — I110 Hypertensive heart disease with heart failure: Secondary | ICD-10-CM | POA: Insufficient documentation

## 2023-03-17 DIAGNOSIS — R002 Palpitations: Secondary | ICD-10-CM | POA: Diagnosis not present

## 2023-03-17 DIAGNOSIS — J449 Chronic obstructive pulmonary disease, unspecified: Secondary | ICD-10-CM | POA: Insufficient documentation

## 2023-03-17 DIAGNOSIS — M069 Rheumatoid arthritis, unspecified: Secondary | ICD-10-CM | POA: Diagnosis not present

## 2023-03-17 DIAGNOSIS — I270 Primary pulmonary hypertension: Secondary | ICD-10-CM

## 2023-03-17 DIAGNOSIS — Z87891 Personal history of nicotine dependence: Secondary | ICD-10-CM | POA: Insufficient documentation

## 2023-03-17 DIAGNOSIS — I5032 Chronic diastolic (congestive) heart failure: Secondary | ICD-10-CM | POA: Diagnosis not present

## 2023-03-17 DIAGNOSIS — Z8249 Family history of ischemic heart disease and other diseases of the circulatory system: Secondary | ICD-10-CM | POA: Insufficient documentation

## 2023-03-17 DIAGNOSIS — J8489 Other specified interstitial pulmonary diseases: Secondary | ICD-10-CM | POA: Diagnosis not present

## 2023-03-17 DIAGNOSIS — M25562 Pain in left knee: Secondary | ICD-10-CM | POA: Diagnosis not present

## 2023-03-17 DIAGNOSIS — J849 Interstitial pulmonary disease, unspecified: Secondary | ICD-10-CM | POA: Diagnosis not present

## 2023-03-17 DIAGNOSIS — I509 Heart failure, unspecified: Secondary | ICD-10-CM | POA: Insufficient documentation

## 2023-03-17 DIAGNOSIS — J9611 Chronic respiratory failure with hypoxia: Secondary | ICD-10-CM

## 2023-03-17 DIAGNOSIS — I2721 Secondary pulmonary arterial hypertension: Secondary | ICD-10-CM | POA: Diagnosis present

## 2023-03-17 DIAGNOSIS — R102 Pelvic and perineal pain: Secondary | ICD-10-CM | POA: Insufficient documentation

## 2023-03-17 DIAGNOSIS — Z9981 Dependence on supplemental oxygen: Secondary | ICD-10-CM | POA: Diagnosis not present

## 2023-03-17 LAB — BRAIN NATRIURETIC PEPTIDE: B Natriuretic Peptide: 60.9 pg/mL (ref 0.0–100.0)

## 2023-03-17 LAB — ECHOCARDIOGRAM COMPLETE
AR max vel: 2.74 cm2
AV Area VTI: 3.04 cm2
AV Area mean vel: 2.94 cm2
AV Mean grad: 12 mmHg
AV Peak grad: 30.8 mmHg
Ao pk vel: 2.77 m/s
Area-P 1/2: 2.42 cm2
MV VTI: 3.48 cm2
S' Lateral: 3.1 cm

## 2023-03-17 LAB — BASIC METABOLIC PANEL
Anion gap: 11 (ref 5–15)
BUN: 14 mg/dL (ref 8–23)
CO2: 24 mmol/L (ref 22–32)
Calcium: 8.9 mg/dL (ref 8.9–10.3)
Chloride: 105 mmol/L (ref 98–111)
Creatinine, Ser: 1.08 mg/dL — ABNORMAL HIGH (ref 0.44–1.00)
GFR, Estimated: 56 mL/min — ABNORMAL LOW (ref >60)
Glucose, Bld: 107 mg/dL — ABNORMAL HIGH (ref 70–99)
Potassium: 4.2 mmol/L (ref 3.5–5.1)
Sodium: 140 mmol/L (ref 135–145)

## 2023-03-17 MED ORDER — ADEMPAS 2.5 MG PO TABS: 2.5000 mg | ORAL_TABLET | Freq: Three times a day (TID) | ORAL | 4 refills | Status: DC

## 2023-03-17 NOTE — Patient Instructions (Signed)
INCREASE Adempas to 2.5 mg Three times a day  Labs done today, your results will be available in MyChart, we will contact you for abnormal readings.  You are scheduled for a Cardiac Catheterization on Tuesday, April 9 with Dr. [name].  1. Please arrive at the [facility] (Main Entrance A) at [facility]: [address] [place], [place] [address] at 7:00 AM (This time is two hours before your procedure to ensure your preparation). Free valet parking service is available.   Special note: Every effort is made to have your procedure done on time. Please understand that emergencies sometimes delay scheduled procedures.  2. Diet: Do not eat solid foods after midnight.  The patient may have clear liquids until 5am upon the day of the procedure.  3. Medication instructions in preparation for your procedure:   Contrast Allergy: No  HOLD Lasix the morning of your procedure. On the morning of your procedure, take any morning medicines NOT listed above.  You may use sips of water.  5. Plan to go home the same day, you will only stay overnight if medically necessary. 6. Bring a current list of your medications and current insurance cards. 7. You MUST have a responsible person to drive you home. 8. Someone MUST be with you the first 24 hours after you arrive home or your discharge will be delayed. 9. Please wear clothes that are easy to get on and off and wear slip-on shoes.  Your physician recommends that you schedule a follow-up appointment in: 2 months (June) ** please call the office in Mid May to arrange your follow up appointment.**  If you have any questions or concerns before your next appointment please send [place] a message through [place] or call our office at [phone].    TO LEAVE A MESSAGE FOR THE NURSE SELECT OPTION 2, PLEASE LEAVE A MESSAGE INCLUDING: YOUR NAME DATE OF BIRTH CALL BACK NUMBER REASON FOR CALL**this is important as we prioritize the call backs  YOU  WILL RECEIVE A CALL BACK THE SAME DAY AS LONG AS YOU CALL BEFORE 4:00 PM  At the [facility], you and your health needs are our priority. As part of our continuing mission to provide you with exceptional heart care, we have created designated Provider Care Teams. These Care Teams include your primary Cardiologist (physician) and Advanced Practice Providers (APPs- Physician Assistants and Nurse Practitioners) who all work together to provide you with the care you need, when you need it.   You may see any of the following providers on your designated Care Team at your next follow up: Dr Arvilla Meres Dr [name] Dr. Marcos Eke, NP Robbie Lis, [place] [facility] [place], [place] Brynda Peon, NP Karle Plumber, PharmD   Please be sure to bring in all your medications bottles to every appointment.    Thank you for choosing [facility]

## 2023-03-17 NOTE — Progress Notes (Signed)
   Date:  03/17/2023   ID:  Kelly Donaldson, DOB 01/28/1956, MRN [id number]  Provider location: [facility] Type of Visit: Established patient   PCP:  Gottschalk, Ashly M, DO  Cardiologist:  Dr. Jaelani Posa   History of Present Illness: Kelly Donaldson is a 67 y.o. female who has a history of chronic hypoxemic respiratory failure and pulmonary hypertension.  She has been followed by Dr Wert, now follows with Dr. McQuaid with pulmonology.  Has been on home oxygen for several years now.  She quit smoking in 2016.  She had an echo done in 5/16 showing dilated RV with severe pulmonary hypertension.  She therefore had RHC in 5/16 which confirmed severe pulmonary hypertension and RV failure.  PFTs showed only mild obstruction and restriction with markedly decreased DLCO suggestive of pulmonary vascular disease.  There was concern for sarcoidosis on CT chest but lymph node biopsy showed no evidence for sarcoidosis.  V/Q scan showed no evidence for chronic PE.    She has not tolerated Revatio or Adcirca.  Have failed previous up-titration of Selexipag with pelvic pain, can tolerate 200 mcg bid.  She is now tolerating riociguat 2 mg tid.    Started torsemide in Oct. 2017, has had better diuresis.    Due to ongoing dyspnea, she had repeat RHC in 5/19 that showed normal filling pressures and well-controlled pulmonary hypertension.  Echo in 6/19 showed normal LV EF, normal RV systolic function, and mild RV dilation.    She now is on Anoro, feels like this has helped her breathing.    Echo in 9/20 showed EF 55-60%, mild RV dilation with normal systolic function, PASP 26 mmHg, IVC normal.   She has been diagnosed with rheumatoid arthritis, which may explain her ILD.   Echo in 11/21 showed EF 55-60% with normal RV, PASP 30, IVC normal.   Echo was done today and reviewed, EF 65-70%, mildly D-shaped septum, mild RV dilation with normal RV systolic function, PASP 51 mmHg, normal IVC.   She  returns for followup of pulmonary HTN.  She is on 2L home oxygen chronically.  Weight is fairly stable. Main complaint is palpitations, this started about 3 months ago. Her left knee pain has steadily worsened and limits her walking.  She used a wheelchair to get in the office but walks around her house.  She is short of breath walking 100-200 feet. This has become mildly worse over time.  Rare palpitations.  No chest pain.  No lightheadedness. Weight down 1 lb.   ECG (personally reviewed): NSR, LAFB   6 minute walk (5/16): 238 meters => decreased oxygen saturation to 73%, increased to 6 L [place] 6 minute walk (7/16): 170 meters => unable to complete.  6 minute walk (9/16): 201 meters 6 minute walk (1/17): 256 meters 6 minute walk (4/17): 219 meters 6 minute walk (8/17): 225 meters 6 minute walk (11/17): 244 meters 6 minute walk (5/18): 229 meters 6 minute walk (2/19): 122 meters (but she was stopped halfway through with drop in oxygen saturation).  6 minute walk (12/20): 304 meters 6 minute walk (8/21): 476 m 6 minute walk (3/22): 213 meters   Labs (2/16): BNP 69 Labs (5/16): LFTs normal, HCT 34.9, RF negative, HIV negative, TSH negative Labs (6/16): K 4.4, creatinine 0.88 Labs (7/16): ANA negative, BNP 58, K 4.3, creatinine 1.1, anti-RNP negative Labs (9/16): K 4.4, creatinine 1.26, BNP 91 Labs (10/16): K 4.3, creatinine 1.04 Labs (12/16): K   4.2, creatinine 1.07, BNP 130 Labs (1/17): K 4.3, creatinine 1.23, BNP 42 Labs (3/17): K 4.2, creatinine 1.19 Labs (5/17): K 4.3, creatinine 1.35, BNP 44 Labs (6/17): K 4.4, creatinine 1.18, BNP 44 Labs (10/17): K 4.2, creatinine 0.98, BNP 43 Labs (12/17): K 4.5, creatinine 1.23. BNP 64.5 Labs (2/18): K 4.5, creatinine 1.21, BNP 64 Labs (4/18): K 4.1, creatinine 1.24, BNP 26 Labs (9/18): K 4.6, creatinine 1.25 Labs (2/19): K 4.2, creatinine 1.39 Labs (5/19): K 4, creatinine 1.4, hgb 8.4 Labs (12/19): K 4.3, creatinine 1.15 Labs (6/20): TSH  normal, K 4, creatinine 1.04, LDL 78 Labs (12/20): LDL 99, K 4.3, creatinine 1.0 Labs (4/21): BNP 36, K 4.1, creatinine 0.96 Labs (8/21): BNP 48, K 4.1, creatinine 0.88 Labs (2/22): hgb 12.2, BNP 22, K 4.1, creatinine 1.03, LDL 129 Labs (4/22): K 4.3, creatinine 1.13 Labs (11/23): K 3.9, creatinine 1.23 Labs (2/24): BNP 49 Labs (3/24): K 3.7, creatinine 1.02 . PMH: 1. Pulmonary hypertension: RHC (5/16) with mean RA 18, PA 83/33 mean 53, mean PCWP 19, CI 2.61, PVR 5.8 WU.   Echo (5/16) with EF 65-70%, septal flattening, dilated RV with PA systolic pressure 99 mmHg, +bubble study.  PFTs (4/16) with FVC 76%, FEV1 75%, ratio 97%, TLC 66%, DLCO 26% => mild restriction, mild obstruction, severe diffuse abnormality (pulmonary vascular problem).  CTA chest (4/16) with chronic interstitial and obstructive lung disease, small mediastinal and hilar lymph noes, no PE.  Lymph node biopsy negative for sarcoidosis (reactive changes).  V/Q scan (6/16): No acute or chronic PE. TEE (6/16): Normal LV size and systolic function, EF 55-60%, RV was mildly dilated with mildly decreased systolic function, D-shaped interventricular septum suggestive of RV pressure/volume overload, there appeared to be a small PFO present with some bubbles crossing, no ASD.  ANA negative, anti-RNP negative, HIV negative. Sleep study (8/16) without OSA.  She did not tolerate Adcirca or Revatio.  She cannot tolerate more than 200 mcg bid Selexipag.  - Echo (6/17): EF 60-65%, normal diastolic function, D-shaped interventricular septum, mildly dilated RV with normal RV systolic function, PASP 39, IVC normal.  - Echo (6/18): EF 60-65%, normal diastolic function, D-shaped interventricular septum, mildly dilated RV with normal RV systolic function, PASP 65 mmHg.  - RHC (7/18): RA 13, PA 51/23 mean 36, PCWP 19, CI 4.74, PVR 1.6 WU - RHC (5/19): mean RA 8, PA 48/20 mean 30, mean PCWP 13, CI 5.06, PVR 1.58 WU.  - Echo (6/19): EF 60-65%, mild RV  dilation with normal RV systolic function, PASP 60, possible PFO.  - Echo (9/20): EF 55-60%, mild RV dilation with normal systolic function, PASP 26 mmHg, IVC normal.  - Echo (11/21): EF 55-60%, normal RV, PASP 30 mmHg, IVC normal.  - Echo (4/24): EF 65-70%, mildly D-shaped septum, mild RV dilation with normal RV systolic function, PASP 51 mmHg, normal IVC.  2. Chronic venous insufficiency. 3. Hyperlipidemia.  4. GERD 5. Chronic hypoxemic respiratory failure: Hypoxemia, on home oxygen.  Seen by Dr McQuaid, has emphysema + interstitial lung disease (?UIP => suspect related to RA).  - PFTs (7/19): FEV1 82%, FVC 80%, ratio 101%, TLC 73%, DLCO 23%.  6. Fe deficiency anemia: EGD/colonoscopy at [facility] normal in 2020.  7. Rheumatoid arthritis: Azathioprine.  8. PVCs: Zio monitor in 2/24 showed 2.2% PVCs.  Current Outpatient Medications  Medication Sig Dispense Refill   Accu-Chek Softclix Lancets lancets Check glucose BID Dx R73.03 200 each 3   albuterol (VENTOLIN HFA) 108 (90 Base) MCG/ACT inhaler   INHALE 2 PUFFS EVERY 4 HOURS AS NEEDED FOR WHEEZING OR SHORTNESS OF BREATH 18 g 0   Alcohol Swabs (B-D SINGLE USE SWABS REGULAR) PADS Check glucose BID Dx R73.03 200 each 3   aspirin EC 81 MG tablet Take 1 tablet (81 mg total) by mouth daily. 90 tablet 3   azaTHIOprine (IMURAN) 50 MG tablet      Blood Glucose Monitoring Suppl (ACCU-CHEK AVIVA PLUS) w/Device KIT Check glucose BID Dx R73.03 1 kit 0   Budeson-Glycopyrrol-Formoterol (BREZTRI AEROSPHERE) 160-9-4.8 MCG/ACT AERO Inhale 2 puffs into the lungs in the morning and at bedtime. 5.9 g 0   citalopram (CELEXA) 20 MG tablet TAKE 1 TABLET BY MOUTH EVERY DAY 90 tablet 1   ferrous sulfate (FERROUSUL) 325 (65 FE) MG tablet Take 1 tablet (325 mg total) by mouth daily with breakfast. 90 tablet 3   fluticasone (FLONASE) 50 MCG/ACT nasal spray Place 1 spray into both nostrils daily. 48 g 3   glucose blood (ACCU-CHEK AVIVA PLUS) test strip Check glucose BID Dx  R73.03 200 each 3   HYDROcodone-acetaminophen (NORCO) 10-325 MG tablet Take 0.5-1 tablets by mouth up to 4- 5 times daily only as needed for chronic multifactorial pain. 150 tablet 0   ipratropium-albuterol (DUONEB) 0.5-2.5 (3) MG/3ML SOLN INHALE THE CONTENTS OF 1 VIAL VIA NEBULIZER FOUR TIMES DAILY AS NEEDED 360 mL 1   Lancets Misc. (ACCU-CHEK SOFTCLIX LANCET DEV) KIT Check glucose BID Dx R73.03 1 kit 0   loratadine (CLARITIN) 10 MG tablet Take 1 tablet (10 mg total) by mouth daily. 90 tablet 3   macitentan (OPSUMIT) 10 MG tablet TAKE 1 TABLET ( ) BY MOUTH DAILY 30 tablet 11   montelukast (SINGULAIR) 10 MG tablet Take 1 tablet (10 mg total) by mouth at bedtime. 30 tablet 11   OXYGEN Take 2.5-4 L by mouth See admin instructions. 2.5 L with resting state & 4 L with exertion [facility]     Polyethyl Glycol-Propyl Glycol 0.4-0.3 % SOLN Place 1 drop into both eyes daily.     potassium chloride SA (KLOR-CON M) 20 MEQ tablet Take 2 tablets (40 mEq total) by mouth every morning AND 1 tablet (20 mEq total) every evening. 180 tablet 3   pravastatin (PRAVACHOL) 20 MG tablet Take 1 tablet (20 mg total) by mouth daily. 90 tablet 3   promethazine-dextromethorphan (PROMETHAZINE-DM) 6.25-15 MG/5ML syrup Take 2.5 mLs by mouth 4 (four) times daily as needed for cough. 240 mL 0   Riociguat (ADEMPAS) 2.5 MG TABS Take 2.5 mg by mouth in the morning, at noon, and at bedtime. 200 tablet 4   Selexipag (UPTRAVI) 200 MCG TABS Take 1 tablet (200 mcg total) by mouth 2 (two) times daily. 180 tablet 3   torsemide (DEMADEX) 20 MG tablet Take 3 tablets (60 mg total) by mouth in the morning AND 2 tablets (40 mg total) every evening. 360 tablet 3   Vitamin D, Ergocalciferol, (DRISDOL) 1.25 MG (50000 UNIT) CAPS capsule Take 1 capsule (50,000 Units total) by mouth every 7 (seven) days. 12 capsule 3   zolpidem (AMBIEN) 5 MG tablet Take 1 tablet (5 mg total) by mouth at bedtime as needed for sleep (please allow to fill every 30  days). 30 tablet 5   No current facility-administered medications for this encounter.    Allergies:   Gabapentin and Latex   Social History:  The patient  reports that she quit smoking about 8 years ago. Her smoking use included cigarettes. She has a 4.25 pack-year  smoking history. She has never used smokeless tobacco. She reports current drug use. Drug: Cocaine. She reports that she does not drink alcohol.   Family History:  The patient's family history includes Asthma in her sister; Breast cancer in her maternal grandmother; Clotting disorder in her daughter; Diabetes in her mother; Emphysema in her father; Hyperlipidemia in her father and mother; Hypertension in her daughter, father, and mother; Other in her mother; Ovarian cancer in her maternal aunt.   ROS:  Please see the history of present illness.   All other systems are personally reviewed and negative.   Exam:   BP (!) 94/58   Pulse 72   Wt 97.5 kg (215 lb)   SpO2 (!) 87% Comment: 2 l n/c  BMI 33.67 kg/m  General: NAD Neck: JVP 8 cm, no thyromegaly or thyroid nodule.  Lungs: Dry crackles at bases. CV: Nondisplaced PMI.  Heart regular S1/S2, no S3/S4, 2/6 early SEM RUSB.  No peripheral edema.  No carotid bruit.  Normal pedal pulses.  Abdomen: Soft, nontender, no hepatosplenomegaly, no distention.  Skin: Intact without lesions or rashes.  Neurologic: Alert and oriented x 3.  Psych: Normal affect. Extremities: No clubbing or cyanosis.  HEENT: Normal.   Recent Labs: 09/27/2022: ALT 6; TSH 0.867 01/28/2023: Hemoglobin 11.1; Platelets 209.0 03/17/2023: B Natriuretic Peptide 60.9; BUN 14; Creatinine, Ser 1.08; Potassium 4.2; Sodium 140  Personally reviewed   Wt Readings from Last 3 Encounters:  03/17/23 97.5 kg (215 lb)  01/28/23 97.8 kg (215 lb 9.6 oz)  01/21/23 98.1 kg (216 lb 3.2 oz)    ASSESSMENT AND PLAN:  1. Pulmonary arterial hypertension with RV failure: Severe on 5/16 RHC. 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  There was concern from CTA chest for sarcoidosis, but lymph node biopsy was negative.  ILD is likely related to rheumatoid arthritis. LFTs normal, ANA negative, anti-RNP negative, HIV negative.  V/Q scan negative for chronic PE.  Small PFO but no ASD on TEE. No OSA on sleep study.  She did not tolerate Revatio due to joint pain (now resolved), and she did not tolerate Adcirca with worsening dyspnea. She does not tolerate more than 200 mcg bid Selexipag due to pelvic pain.   RHCs in 7/18 and 5/19 have shown well-compensated pulmonary hypertension.  PVR was 1.58 WU on 5/19 study. Echo in 11/21 showed EF 55-60% with normal RV, PASP 30, normal IVC.  Echo was today showed EF 65-70%, mildly D-shaped septum, mild RV dilation with normal RV systolic function, PASP 51 mmHg, normal IVC.  Estimated PA pressure is higher on today's echo than on the prior.  She also has ILD and COPD that contribute to her dyspnea.  She is not markedly volume overloaded on exam.  NYHA class III symptoms.  - Continue oxygen.  - Open lung biopsy deemed too risky, probably not sarcoid per pulmonary evaluation => suspect ILD related to RA.    - Continue opsumit.  - Will try again to increase riociguat to 2.5 mg tid.   - She has tolerated selexipag 200 mcg bid but has not been able to increase.  - Continue torsemide 60 qam/40 qpm and KCl to 40 qam/20 qpm.  BMET/BNP today.  - She does not feel that she can do a 6 minute walk today due to her knee pain.  - I am going to repeat RHC given higher PA pressure by echo   today and gradually worsening symptoms.  If PA pressure is significantly elevated still, would consider starting sotatercept. I discussed risks/benefits of RHC and she agrees to procedure.  2. Chronic hypoxemic respiratory failure: Chronic interstitial lung disease + emphysema.  Concern for sarcoidosis but lymph node biopsy did not show sarcoid.  Deemed too high risk for open lung biopsy.  Per pulmonary, she is in the fibrotic phase of NSIP.  She has been diagnosed with RA, this may be the source of her ILD.  - Continue pulmonary followup, sees Dr. Hunsucker.  3. Hyperlipidemia: Continue pravastatin.  4. Rheumatoid arthritis: On azathioprine, follows with Dr. Ariail.  5. Palpitations: Occasional PVCs on 2/24 Zio monitor.   Followup in 2 months.   Signed, Chyla Schlender, MD  03/17/2023  [facility] [address] [facility] [place] [address] [phone] (office) [phone] (fax) 

## 2023-03-17 NOTE — Progress Notes (Signed)
  ID: Kelly Donaldson, female    DOB: Jun 21, 1956, 67 y.o.   MRN: [id number]  Chief Complaint  Patient presents with   Follow-up    Pt is here for follow up for ILD. Pt states that the Kelly Donaldson is working well for her. Pt is on continuous oxygen.     Referring provider: [name], DO  HPI:   67 y.o. 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  Formally patient of Dr. [name] and more recently Dr. [name].  Most recent cardiology note reviewed.  Most recent pulmonary note from today.  NP reviewed.     Overall, doing okay.  Breathing stable.  Seen a few weeks ago for acute visit.  A lot of congestion and cough.  Improved with doxycycline and prednisone.  Likely allergy related or exacerbation of underlying reactive airways disease/asthma.  She continues good adherence to [place].  She thinks it helps.  Symptoms much improved like acid.  Stable on nasal congestion with pollens etc.  Discussed tragedies as below today with this.  Questionaires / Pulmonary Flowsheets:   ACT:      No data to display          MMRC: mMRC Dyspnea Scale mMRC Score  04/13/2021 10:39 AM 2    Epworth:      No data to display          Tests:   FENO:  No results found for: "NITRICOXIDE"  PFT:    Latest Ref Rng & Units 06/22/2021   12:01 PM 06/18/2018    9:08 AM 03/20/2015    9:09 AM  PFT Results  FVC-Pre L 2.17  2.37  2.33   FVC-Predicted Pre % 75  80  76   FVC-Post L 2.28   2.32   FVC-Predicted Post % 79   76   Pre FEV1/FVC % % 79  80  78   Post FEV1/FCV % % 79   77   FEV1-Pre L 1.72  1.90  1.81   FEV1-Predicted Pre % 76  82  75   FEV1-Post L 1.79   1.79   DLCO uncorrected ml/min/mmHg 6.88  6.52  7.39   DLCO UNC% % DLCO corrected ml/min/mmHg  8.12    DLCO COR %Predicted %  28    DLVA Predicted % 40  43  35   TLC L 3.86  4.07  3.63   TLC % Predicted % 70  73  66   RV % Predicted % 68  81  38   Personally  reviewed and interpreted as mild restriction with severely reduced DLCO consistent with concomitant ILD and pulmonary hypertension. Overall values stable since 2016.  WALK:     07/11/2020   10:48 AM 11/22/2019    8:58 AM 06/18/2018   10:50 AM 11/26/2016   10:48 AM 05/09/2015    9:39 AM 03/20/2015   12:14 PM 01/11/2015    4:00 PM  SIX MIN WALK  Medications aspirin , hydrocodone-acetaminophen 10-325mg , opsumit , potassium chloride , adempas , selexipag , torsemide , taken at 7:30am   ASA , Celexa , Vit D 50000iu, Norco 10-325mg , Macitenan , klor Con , Riociguat 1.5mg , Selexipag , Demedex  @ 0600     Supplimental Oxygen during Test? (L/min) Yes Yes Yes Yes Yes Yes Yes  O2 Flow Rate 3 L/min 2 L/min 6 L/min 4 L/min 4 L/min 2 L/min 2  L/min  Type Continuous Continuous Continuous Pulse Pulse Continuous Continuous  Laps 14   5     Partial Lap (in Meters) 0 meters   15 meters     Baseline BP (sitting) 112/70   104/62     Baseline Heartrate 68 73  73     Baseline Dyspnea (Borg Scale) 0   0.5     Baseline Fatigue (Borg Scale) 0   0     Baseline SPO2 96 % 94 %  100 %     BP (sitting) 120/80   124/58     Heartrate 85 100  92     Dyspnea (Borg Scale) 2   4     Fatigue (Borg Scale) 2   4     SPO2 84 % 54 %  87 %     BP (sitting) 114/76   114/64     Heartrate 72 72  86     SPO2 97 % 94 %  93 %     Stopped or Paused before Six Minutes Yes Yes  Yes     Other Symptoms at end of Exercise Pt stopped with 50 sec remaining due to just "being done." Pt also stated that she felt like her knee was popping out of joint. tired  pt stopped at 3:33secs d/t fatigue and dyspnea, resumed 3:23secs.  stopped at 2:52sec d/t fatigue and dyspnea (Dyspnea Borg = 10  sat = 86% 4lpm pulsed), increased O2 to 5lpm pulsed, sats recovered to 95%, resumed test at 1:02secs     Interpretation Leg pain        Distance Completed 476 meters   255 meters     Distance Completed 0 meters         Tech Comments: Pt walked at a normal pace, denied any complaints during walk. stopped with 50 seconds remaining due to being done with the walk. 304.558meters pt started walking on 3L but needed 6L to maintain at 91%, we stopped when O2 reached 75% TA/CMA  after sat dropped to 88%4lpm pulsed--increased o2 to 5lpm pulsed and pt walked another lap with sats ending at 79%5lpm pulsed/normal pace/no SOB//lmr After lap 1 sats= 87%2lpm--increased o2 to 4 lpm and walked another lap and sats = 89%4lpm cont/pt c/o minimal SOB/walked normal pace//lmr normal pace/stopped due to increased SOB//lmr    Imaging: Personally reviewed CT high-resolution chest most recent 06/2018 demonstrates upper lobe predominant emphysema as well as what appears to be NSIP pattern ILD with traction bronchiectasis in bilateral lower lobes on my interpretation  Lab Results: Personally reviewed, notably eosinophils as high as 700 CBC    Component Value Date/Time   WBC 5.6 01/28/2023 1554   RBC 3.98 01/28/2023 1554   HGB 11.1 (L) 01/28/2023 1554   HGB 11.7 10/29/2021 1354   HCT 33.8 (L) 01/28/2023 1554   HCT 37.2 10/29/2021 1354   PLT 209.0 01/28/2023 1554   PLT 241 10/29/2021 1354   MCV 84.8 01/28/2023 1554   MCV 85 10/29/2021 1354   MCH 26.8 10/29/2021 1354   MCH 20.9 (L) 05/06/2018 0948   MCHC 32.9 01/28/2023 1554   RDW 15.7 (H) 01/28/2023 1554   RDW 13.8 10/29/2021 1354   LYMPHSABS 1.2 01/28/2023 1554   LYMPHSABS 2.0 11/11/2019 0939   MONOABS 0.5 01/28/2023 1554   EOSABS 0.4 01/28/2023 1554   EOSABS 0.7 (H) 11/11/2019 0939   BASOSABS 0.1 01/28/2023 1554   BASOSABS 0.1 11/11/2019 0939    BMET    Component  Value Date/Time   NA 137 01/21/2023 1413   NA 140 09/27/2022 1045   K 3.9 01/21/2023 1413   CL 103 01/21/2023 1413   CO2 25 01/21/2023 1413   GLUCOSE 84 01/21/2023 1413   BUN 14 01/21/2023 1413   BUN 19 09/27/2022 1045   CREATININE 1.02 (H) 01/21/2023 1413   CALCIUM 8.7 (L) 01/21/2023 1413    GFRNONAA >60 01/21/2023 1413   GFRAA 66 01/22/2021 0947    BNP    Component Value Date/Time   BNP 49.5 01/21/2023 1413    ProBNP    Component Value Date/Time   PROBNP 69.0 01/11/2015 1630    Specialty Problems       Pulmonary Problems   Chronic respiratory failure with hypoxia    Followed in Pulmonary clinic/ [facility]/ [facility]  On 02 since 2013 (originally [facility]) - pulmonary eval [facility] last seen 12/17/12 with "neg CTa for PE, pfts ok except dlco 20%, echo c/w  PH, 02 dep hypoxemia > rec RHC and stop phenterimine" -Echo 09/15/14     LAE mildly dilated, as are RV and RA  - 01/11/2015   Walked 2lpm  x one lap @ 185 stopped due to  Sob, nl pace, no desat - Quit smoking 03/02/15  - PFTs 03/20/2015  Nl except for DLCO 26 corrects to 35%  - 03/20/2015   Walked 2lpm x one lap @ 185 stopped due to sob/ desat corrected on 4lpm  - 04/07/2015 rec  referral to Dr Jearld Pies for Right heart Cath - Repeat Echo with bubble study 04/19/2015 > With injection of agitated saline intravenously   there is appearance of bubbles in L sided chambers consistent   with 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 - 05/09/2015   Walked 4lpm x one lap @ 185 stopped due to  88% one lap > 05/11/15 Pos small PFO - 07/11/2020 desat during 6 mw on 3lpm cont   As of 07/11/2020  = 2lpm hs and rest/ up to 5lpm POC           ILD (interstitial lung disease)    08/2016 high-resolution CT scan of the chest: [facility] review: Peripheral based interlobular septal thickening and groundglass consistent with fibrosis with some mild bronchiectasis, fibrotic changes appear to be more prominent in the lower lobes with upper lobe nodules, no air trapping noted on expiratory images, mediastinal lymphadenopathy stable per radiology  Chest HRCT 07/07/18 . CT pattern is considered indeterminate for usual interstitial pneumonia (UIP). Overall, given the stability of the imaging features and the spectrum of findings, this is strongly favored to reflect  fibrotic phase nonspecific interstitial pneumonia (NSIP)      Pulmonary hypertension associated with sarcoidosis   Centrilobular emphysema   Allergic rhinitis   COPD GOLD 0     Quit smoking 2016  - PFT's  06/18/18  FEV1 1.90 (82 % ) ratio 0.80  p no % improvement from saba p ? prior to study with DLCO  6.5 (23%) corrects to 2.23 (43%)  for alv volume and FV curve mild/mod curvature in effort indep portion of f/v > started on anoro and helped doe  CT chest 07/07/18 mild centrilobular and paraseptal emphysema. - 02/21/2020  After extensive coaching inhaler device,  effectiveness =    90% with smi > changed to stiolto per insurance req         Allergies  Allergen Reactions   Gabapentin Other (See Comments)    Pt states that she can not take with antidepressants  Latex Rash    Immunization History  Administered Date(s) Administered   Fluad Quad(high Dose 65+) 09/27/2022   Influenza Split 09/16/2014, 09/20/2015, 09/08/2017, 10/04/2017   Influenza,inj,Quad PF,6+ Mos 09/04/2016, 11/12/2018, 08/17/2019, 09/07/2020   Influenza-Unspecified 10/13/2017, 08/29/2021   Moderna Sars-Covid-2 Vaccination 02/21/2020, 03/20/2020, 12/16/2020   PNEUMOCOCCAL CONJUGATE-20 03/27/2022   Pneumococcal Polysaccharide-23 11/11/2019   Zoster Recombinat (Shingrix) 09/20/2020, 04/17/2021    Past Medical History:  Diagnosis Date   Allergy    Depression    GERD (gastroesophageal reflux disease)    Hyperlipidemia    Oxygen dependent    Shortness of breath dyspnea    Varicose veins     Tobacco History: Social History   Tobacco Use  Smoking Status Former   Packs/day: 0.25   Years: 17.00   Additional pack years: 0.00   Total pack years: 4.25   Types: Cigarettes   Quit date: 03/02/2015   Years since quitting: 8.0  Smokeless Tobacco Never   Counseling given: Not Answered   Continue to not smoke  Outpatient Encounter Medications as of 03/17/2023  Medication Sig   Accu-Chek Softclix Lancets  lancets Check glucose BID Dx R73.03   ADEMPAS 2 MG TABS Take 2 mg by mouth 3 (three) times daily.   albuterol (VENTOLIN HFA) 108 (90 Base) MCG/ACT inhaler INHALE 2 PUFFS EVERY 4 HOURS AS NEEDED FOR WHEEZING OR SHORTNESS OF BREATH   Alcohol Swabs (B-D SINGLE USE SWABS REGULAR) PADS Check glucose BID Dx R73.03   aspirin EC 81 MG tablet Take 1 tablet (81 mg total) by mouth daily.   azaTHIOprine (IMURAN) 50 MG tablet    Blood Glucose Monitoring Suppl (ACCU-CHEK AVIVA PLUS) w/Device KIT Check glucose BID Dx R73.03   Budeson-Glycopyrrol-Formoterol (BREZTRI AEROSPHERE) 160-9-4.8 MCG/ACT AERO Inhale 2 puffs into the lungs in the morning and at bedtime.   Budeson-Glycopyrrol-Formoterol (BREZTRI AEROSPHERE) 160-9-4.8 MCG/ACT AERO Inhale 2 puffs into the lungs in the morning and at bedtime.   citalopram (CELEXA) 20 MG tablet TAKE 1 TABLET BY MOUTH EVERY DAY   ferrous sulfate (FERROUSUL) 325 (65 FE) MG tablet Take 1 tablet (325 mg total) by mouth daily with breakfast.   fluticasone (FLONASE) 50 MCG/ACT nasal spray Place 1 spray into both nostrils daily.   glucose blood (ACCU-CHEK AVIVA PLUS) test strip Check glucose BID Dx R73.03   HYDROcodone-acetaminophen (NORCO) 10-325 MG tablet    HYDROcodone-acetaminophen (NORCO) 10-325 MG tablet Take 0.5-1 tablets by mouth up to 4 to 5 times a day only as needed for chronic multifactorial pain   HYDROcodone-acetaminophen (NORCO) 10-325 MG tablet Take 0.5-1 tablets by mouth up to 4- 5 times daily only as needed for chronic multifactorial pain.   ipratropium-albuterol (DUONEB) 0.5-2.5 (3) MG/3ML SOLN INHALE THE CONTENTS OF 1 VIAL VIA NEBULIZER FOUR TIMES DAILY AS NEEDED   Lancets Misc. (ACCU-CHEK SOFTCLIX LANCET DEV) KIT Check glucose BID Dx R73.03   loratadine (CLARITIN) 10 MG tablet Take 1 tablet (10 mg total) by mouth daily.   macitentan (OPSUMIT) 10 MG tablet TAKE 1 TABLET (10MG ) BY MOUTH DAILY   montelukast (SINGULAIR) 10 MG tablet Take 1 tablet (10 mg total) by  mouth at bedtime.   OXYGEN Take 2.5-4 L by mouth See admin instructions. 2.5 L with resting state & 4 L with exertion [employer]   Polyethyl Glycol-Propyl Glycol 0.4-0.3 % SOLN Place 1 drop into both eyes daily.   potassium chloride SA (KLOR-CON M) 20 MEQ tablet Take 2 tablets (40 mEq total) by mouth every morning AND 1 tablet (20  mEq total) every evening.   pravastatin (PRAVACHOL) 20 MG tablet Take 1 tablet (20 mg total) by mouth daily.   promethazine-dextromethorphan (PROMETHAZINE-DM) 6.25-15 MG/5ML syrup Take 2.5 mLs by mouth 4 (four) times daily as needed for cough.   Selexipag (UPTRAVI) 200 MCG TABS Take 1 tablet (200 mcg total) by mouth 2 (two) times daily.   torsemide (DEMADEX) 20 MG tablet Take 3 tablets (60 mg total) by mouth in the morning AND 2 tablets (40 mg total) every evening.   UPTRAVI 200 MCG TABS Take 1 tablet (200 mcg total) by mouth 2 (two) times daily. NEEDS FOLLOW UP APPOINTMENT FOR ANY MORE REFILLS   Vitamin D, Ergocalciferol, (DRISDOL) 1.25 MG (50000 UNIT) CAPS capsule Take 1 capsule (50,000 Units total) by mouth every 7 (seven) days.   zolpidem (AMBIEN) 5 MG tablet Take 1 tablet (5 mg total) by mouth at bedtime as needed for sleep (please allow to fill every 30 days).   No facility-administered encounter medications on file as of 03/17/2023.     Review of Systems  Review of Systems  N/a Physical Exam  There were no vitals taken for this visit.  Wt Readings from Last 5 Encounters:  01/28/23 215 lb 9.6 oz (97.8 kg)  01/21/23 216 lb 3.2 oz (98.1 kg)  11/07/22 211 lb 6 oz (95.9 kg)  09/27/22 214 lb 9.6 oz (97.3 kg)  08/29/22 211 lb 3.2 oz (95.8 kg)    BMI Readings from Last 5 Encounters:  01/28/23 33.77 kg/m  01/21/23 31.93 kg/m  11/07/22 31.21 kg/m  09/27/22 31.69 kg/m  08/29/22 31.19 kg/m     Physical Exam General: Well-appearing, no acute distress Eyes: EOMI, no icterus Neck: Supple no JVP Cardiovascular: Regular rate and rhythm, no  murmur Pulmonary: Faint crackles in bilateral bases, otherwise clear to auscultation, normal work of breathing on nasal cannula Abdomen: Nondistended, bowel sounds present MSK: No synovitis, joint effusion Skin: Scattered small ecchymoses L arm Neuro: Normal gait, no weakness Psych: Normal mood, full affect   Assessment & Plan:    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  On Imuran 100 mg daily for arthritis, this is likely helping with her ILD as well.  No worsening dyspnea.    Pulmonary hypertension: Likely multifactorial, group 1 given connective tissue disease, group 3 given ILD.  Continue medicines per Dr. Jearld Pies and cardiology.   Seasonal allergies: Increase Flonase to twice a day for a week then once daily, start Zyrtec, Afrin twice daily for 3 days then stop.  Emphysema and asthma overlap: atopic symptoms, mild improvement in breathing with ICS/LABA.  Switch to Trelegy given concerns for side effects.  Side effects no longer felt related to [facility], just TMJ.  Continue Breztri 2 puff twice daily.     Return in about 3 months (around 06/16/2023) for f/u Dr. Judeth Horn.   Karren Burly, MD 03/17/2023

## 2023-03-17 NOTE — H&P (View-Only) (Signed)
Date:  03/17/2023   ID:  Kelly Haileyeborah L Gaede, DOB 08/16/1956, MRN [id number]  Provider location:  [facility] Type of Visit: Established patient   PCP:  Raliegh IpGottschalk, Ashly M, DO  Cardiologist:  Dr. Shirlee LatchMcLean   History of Present Illness: Kelly Donaldson is a 67 y.o. female who has a history of chronic hypoxemic respiratory failure and pulmonary hypertension.  She has been followed by Dr Sherene SiresWert, now follows with Dr. Kendrick FriesMcQuaid with pulmonology.  Has been on home oxygen for several years now.  She quit smoking in 2016.  She had an echo done in 5/16 showing dilated RV with severe pulmonary hypertension.  She therefore had RHC in 5/16 which confirmed severe pulmonary hypertension and RV failure.  PFTs showed only mild obstruction and restriction with markedly decreased DLCO suggestive of pulmonary vascular disease.  There was concern for sarcoidosis on CT chest but lymph node biopsy showed no evidence for sarcoidosis.  V/Q scan showed no evidence for chronic PE.    She has not tolerated Revatio or Adcirca.  Have failed previous up-titration of Selexipag with pelvic pain, can tolerate 200 mcg bid.  She is now tolerating riociguat 2 mg tid.    Started torsemide in Oct. 2017, has had better diuresis.    Due to ongoing dyspnea, she had repeat RHC in 5/19 that showed normal filling pressures and well-controlled pulmonary hypertension.  Echo in 6/19 showed normal LV EF, normal RV systolic function, and mild RV dilation.    She now is on Anoro, feels like this has helped her breathing.    Echo in 9/20 showed EF 55-60%, mild RV dilation with normal systolic function, PASP 26 mmHg, IVC normal.   She has been diagnosed with rheumatoid arthritis, which may explain her ILD.   Echo in 11/21 showed EF 55-60% with normal RV, PASP 30, IVC normal.   Echo was done today and reviewed, EF 65-70%, mildly D-shaped septum, mild RV dilation with normal RV systolic function, PASP 51 mmHg, normal IVC.   She  returns for followup of pulmonary HTN.  She is on 2L home oxygen chronically.  Weight is fairly stable. Main complaint is palpitations, this started about 3 months ago. Her left knee pain has steadily worsened and limits her walking.  She used a wheelchair to get in the office but walks around her house.  She is short of breath walking 100-200 feet. This has become mildly worse over time.  Rare palpitations.  No chest pain.  No lightheadedness. Weight down 1 lb.   ECG (personally reviewed): NSR, LAFB   6 minute walk (5/16): 238 meters => decreased oxygen saturation to 73%, increased to 6 L [place] 6 minute walk (7/16): 170 meters => unable to complete.  6 minute walk (9/16): 201 meters 6 minute walk (1/17): 256 meters 6 minute walk (4/17): 219 meters 6 minute walk (8/17): 225 meters 6 minute walk (11/17): 244 meters 6 minute walk (5/18): 229 meters 6 minute walk (2/19): 122 meters (but she was stopped halfway through with drop in oxygen saturation).  6 minute walk (12/20): 304 meters 6 minute walk (8/21): 476 m 6 minute walk (3/22): 213 meters   Labs (2/16): BNP 69 Labs (5/16): LFTs normal, HCT 34.9, RF negative, HIV negative, TSH negative Labs (6/16): K 4.4, creatinine 0.88 Labs (7/16): ANA negative, BNP 58, K 4.3, creatinine 1.1, anti-RNP negative Labs (9/16): K 4.4, creatinine 1.26, BNP 91 Labs (10/16): K 4.3, creatinine 1.04 Labs (12/16): K  4.2, creatinine 1.07, BNP 130 Labs (1/17): K 4.3, creatinine 1.23, BNP 42 Labs (3/17): K 4.2, creatinine 1.19 Labs (5/17): K 4.3, creatinine 1.35, BNP 44 Labs (6/17): K 4.4, creatinine 1.18, BNP 44 Labs (10/17): K 4.2, creatinine 0.98, BNP 43 Labs (12/17): K 4.5, creatinine 1.23. BNP 64.5 Labs (2/18): K 4.5, creatinine 1.21, BNP 64 Labs (4/18): K 4.1, creatinine 1.24, BNP 26 Labs (9/18): K 4.6, creatinine 1.25 Labs (2/19): K 4.2, creatinine 1.39 Labs (5/19): K 4, creatinine 1.4, hgb 8.4 Labs (12/19): K 4.3, creatinine 1.15 Labs (6/20): TSH  normal, K 4, creatinine 1.04, LDL 78 Labs (12/20): LDL 99, K 4.3, creatinine 1.0 Labs (4/21): BNP 36, K 4.1, creatinine 0.96 Labs (8/21): BNP 48, K 4.1, creatinine 0.88 Labs (2/22): hgb 12.2, BNP 22, K 4.1, creatinine 1.03, LDL 129 Labs (4/22): K 4.3, creatinine 1.13 Labs (11/23): K 3.9, creatinine 1.23 Labs (2/24): BNP 49 Labs (3/24): K 3.7, creatinine 1.02 . PMH: 1. Pulmonary hypertension: RHC (5/16) with mean RA 18, PA 83/33 mean 53, mean PCWP 19, CI 2.61, PVR 5.8 WU.   Echo (5/16) with EF 65-70%, septal flattening, dilated RV with PA systolic pressure 99 mmHg, +bubble study.  PFTs (4/16) with FVC 76%, FEV1 75%, ratio 97%, TLC 66%, DLCO 26% => mild restriction, mild obstruction, severe diffuse abnormality (pulmonary vascular problem).  CTA chest (4/16) with chronic interstitial and obstructive lung disease, small mediastinal and hilar lymph noes, no PE.  Lymph node biopsy negative for sarcoidosis (reactive changes).  V/Q scan (6/16): No acute or chronic PE. TEE (6/16): Normal LV size and systolic function, EF 55-60%, RV was mildly dilated with mildly decreased systolic function, D-shaped interventricular septum suggestive of RV pressure/volume overload, there appeared to be a small PFO present with some bubbles crossing, no ASD.  ANA negative, anti-RNP negative, HIV negative. Sleep study (8/16) without OSA.  She did not tolerate Adcirca or Revatio.  She cannot tolerate more than 200 mcg bid Selexipag.  - Echo (6/17): EF 60-65%, normal diastolic function, D-shaped interventricular septum, mildly dilated RV with normal RV systolic function, PASP 39, IVC normal.  - Echo (6/18): EF 60-65%, normal diastolic function, D-shaped interventricular septum, mildly dilated RV with normal RV systolic function, PASP 65 mmHg.  - RHC (7/18): RA 13, PA 51/23 mean 36, PCWP 19, CI 4.74, PVR 1.6 WU - RHC (5/19): mean RA 8, PA 48/20 mean 30, mean PCWP 13, CI 5.06, PVR 1.58 WU.  - Echo (6/19): EF 60-65%, mild RV  dilation with normal RV systolic function, PASP 60, possible PFO.  - Echo (9/20): EF 55-60%, mild RV dilation with normal systolic function, PASP 26 mmHg, IVC normal.  - Echo (11/21): EF 55-60%, normal RV, PASP 30 mmHg, IVC normal.  - Echo (4/24): EF 65-70%, mildly D-shaped septum, mild RV dilation with normal RV systolic function, PASP 51 mmHg, normal IVC.  2. Chronic venous insufficiency. 3. Hyperlipidemia.  4. GERD 5. Chronic hypoxemic respiratory failure: Hypoxemia, on home oxygen.  Seen by Dr [name], has emphysema + interstitial lung disease (?UIP => suspect related to RA).  - PFTs (7/19): FEV1 82%, FVC 80%, ratio 101%, TLC 73%, DLCO 23%.  6. Fe deficiency anemia: EGD/colonoscopy at [facility] normal in 2020.  7. Rheumatoid arthritis: Azathioprine.  8. PVCs: Zio monitor in 2/24 showed 2.2% PVCs.  Current Outpatient Medications  Medication Sig Dispense Refill   Accu-Chek Softclix Lancets lancets Check glucose BID Dx R73.03 200 each 3   albuterol (VENTOLIN HFA) 108 (90 Base) MCG/ACT inhaler  INHALE 2 PUFFS EVERY 4 HOURS AS NEEDED FOR WHEEZING OR SHORTNESS OF BREATH 18 g 0   Alcohol Swabs (B-D SINGLE USE SWABS REGULAR) PADS Check glucose BID Dx R73.03 200 each 3   aspirin EC 81 MG tablet Take 1 tablet (81 mg total) by mouth daily. 90 tablet 3   azaTHIOprine (IMURAN) 50 MG tablet      Blood Glucose Monitoring Suppl (ACCU-CHEK AVIVA PLUS) w/Device KIT Check glucose BID Dx R73.03 1 kit 0   Budeson-Glycopyrrol-Formoterol (BREZTRI AEROSPHERE) 160-9-4.8 MCG/ACT AERO Inhale 2 puffs into the lungs in the morning and at bedtime. 5.9 g 0   citalopram (CELEXA) 20 MG tablet TAKE 1 TABLET BY MOUTH EVERY DAY 90 tablet 1   ferrous sulfate (FERROUSUL) 325 (65 FE) MG tablet Take 1 tablet (325 mg total) by mouth daily with breakfast. 90 tablet 3   fluticasone (FLONASE) 50 MCG/ACT nasal spray Place 1 spray into both nostrils daily. 48 g 3   glucose blood (ACCU-CHEK AVIVA PLUS) test strip Check glucose BID Dx  R73.03 200 each 3   HYDROcodone-acetaminophen (NORCO) 10-325 MG tablet Take 0.5-1 tablets by mouth up to 4- 5 times daily only as needed for chronic multifactorial pain. 150 tablet 0   ipratropium-albuterol (DUONEB) 0.5-2.5 (3) MG/3ML SOLN INHALE THE CONTENTS OF 1 VIAL VIA NEBULIZER FOUR TIMES DAILY AS NEEDED 360 mL 1   Lancets Misc. (ACCU-CHEK SOFTCLIX LANCET DEV) KIT Check glucose BID Dx R73.03 1 kit 0   loratadine (CLARITIN) 10 MG tablet Take 1 tablet (10 mg total) by mouth daily. 90 tablet 3   macitentan (OPSUMIT) 10 MG tablet TAKE 1 TABLET ( ) BY MOUTH DAILY 30 tablet 11   montelukast (SINGULAIR) 10 MG tablet Take 1 tablet (10 mg total) by mouth at bedtime. 30 tablet 11   OXYGEN Take 2.5-4 L by mouth See admin instructions. 2.5 L with resting state & 4 L with exertion [facility]     Polyethyl Glycol-Propyl Glycol 0.4-0.3 % SOLN Place 1 drop into both eyes daily.     potassium chloride SA (KLOR-CON M) 20 MEQ tablet Take 2 tablets (40 mEq total) by mouth every morning AND 1 tablet (20 mEq total) every evening. 180 tablet 3   pravastatin (PRAVACHOL) 20 MG tablet Take 1 tablet (20 mg total) by mouth daily. 90 tablet 3   promethazine-dextromethorphan (PROMETHAZINE-DM) 6.25-15 MG/5ML syrup Take 2.5 mLs by mouth 4 (four) times daily as needed for cough. 240 mL 0   Riociguat (ADEMPAS) 2.5 MG TABS Take 2.5 mg by mouth in the morning, at noon, and at bedtime. 200 tablet 4   Selexipag (UPTRAVI) 200 MCG TABS Take 1 tablet (200 mcg total) by mouth 2 (two) times daily. 180 tablet 3   torsemide (DEMADEX) 20 MG tablet Take 3 tablets (60 mg total) by mouth in the morning AND 2 tablets (40 mg total) every evening. 360 tablet 3   Vitamin D, Ergocalciferol, (DRISDOL) 1.25 MG (50000 UNIT) CAPS capsule Take 1 capsule (50,000 Units total) by mouth every 7 (seven) days. 12 capsule 3   zolpidem (AMBIEN) 5 MG tablet Take 1 tablet (5 mg total) by mouth at bedtime as needed for sleep (please allow to fill every 30  days). 30 tablet 5   No current facility-administered medications for this encounter.    Allergies:   Gabapentin and Latex   Social History:  The patient  reports that she quit smoking about 8 years ago. Her smoking use included cigarettes. She has a 4.25 pack-year  smoking history. She has never used smokeless tobacco. She reports current drug use. Drug: Cocaine. She reports that she does not drink alcohol.   Family History:  The patient's family history includes Asthma in her sister; Breast cancer in her maternal grandmother; Clotting disorder in her daughter; Diabetes in her mother; Emphysema in her father; Hyperlipidemia in her father and mother; Hypertension in her daughter, father, and mother; Other in her mother; Ovarian cancer in her maternal aunt.   ROS:  Please see the history of present illness.   All other systems are personally reviewed and negative.   Exam:   BP (!) 94/58   Pulse 72   Wt 97.5 kg (215 lb)   SpO2 (!) 87% Comment: 2 l n/c  BMI 33.67 kg/m  General: NAD Neck: JVP 8 cm, no thyromegaly or thyroid nodule.  Lungs: Dry crackles at bases. CV: Nondisplaced PMI.  Heart regular S1/S2, no S3/S4, 2/6 early SEM RUSB.  No peripheral edema.  No carotid bruit.  Normal pedal pulses.  Abdomen: Soft, nontender, no hepatosplenomegaly, no distention.  Skin: Intact without lesions or rashes.  Neurologic: Alert and oriented x 3.  Psych: Normal affect. Extremities: No clubbing or cyanosis.  HEENT: Normal.   Recent Labs: 09/27/2022: ALT 6; TSH 0.867 01/28/2023: Hemoglobin 11.1; Platelets 209.0 03/17/2023: B Natriuretic Peptide 60.9; BUN 14; Creatinine, Ser 1.08; Potassium 4.2; Sodium 140  Personally reviewed   Wt Readings from Last 3 Encounters:  03/17/23 97.5 kg (215 lb)  01/28/23 97.8 kg (215 lb 9.6 oz)  01/21/23 98.1 kg (216 lb 3.2 oz)    ASSESSMENT AND PLAN:  1. Pulmonary arterial hypertension with RV failure: Severe on 5/16 RHC. 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  There was concern from CTA chest for sarcoidosis, but lymph node biopsy was negative.  ILD is likely related to rheumatoid arthritis. LFTs normal, ANA negative, anti-RNP negative, HIV negative.  V/Q scan negative for chronic PE.  Small PFO but no ASD on TEE. No OSA on sleep study.  She did not tolerate Revatio due to joint pain (now resolved), and she did not tolerate Adcirca with worsening dyspnea. She does not tolerate more than 200 mcg bid Selexipag due to pelvic pain.   RHCs in 7/18 and 5/19 have shown well-compensated pulmonary hypertension.  PVR was 1.58 WU on 5/19 study. Echo in 11/21 showed EF 55-60% with normal RV, PASP 30, normal IVC.  Echo was today showed EF 65-70%, mildly D-shaped septum, mild RV dilation with normal RV systolic function, PASP 51 mmHg, normal IVC.  Estimated PA pressure is higher on today's echo than on the prior.  She also has ILD and COPD that contribute to her dyspnea.  She is not markedly volume overloaded on exam.  NYHA class III symptoms.  - Continue oxygen.  - Open lung biopsy deemed too risky, probably not sarcoid per pulmonary evaluation => suspect ILD related to RA.    - Continue opsumit.  - Will try again to increase riociguat to 2.5 mg tid.   - She has tolerated selexipag 200 mcg bid but has not been able to increase.  - Continue torsemide 60 qam/40 qpm and KCl to 40 qam/20 qpm.  BMET/BNP today.  - She does not feel that she can do a 6 minute walk today due to her knee pain.  - I am going to repeat RHC given higher PA pressure by echo  today and gradually worsening symptoms.  If PA pressure is significantly elevated still, would consider starting sotatercept. I discussed risks/benefits of RHC and she agrees to procedure.  2. Chronic hypoxemic respiratory failure: Chronic interstitial lung disease + emphysema.  Concern for sarcoidosis but lymph node biopsy did not show sarcoid.  Deemed too high risk for open lung biopsy.  Per pulmonary, she is in the fibrotic phase of NSIP.  She has been diagnosed with RA, this may be the source of her ILD.  - Continue pulmonary followup, sees Dr. Judeth HornHunsucker.  3. Hyperlipidemia: Continue pravastatin.  4. Rheumatoid arthritis: On azathioprine, follows with Dr. Cena BentonAriail.  5. Palpitations: Occasional PVCs on 2/24 Zio monitor.   Followup in 2 months.   Signed, Marca Anconaalton Casmere Hollenbeck, MD  03/17/2023  [facility] [address] [facility] [place] [address] [phone] (office) [phone] (fax)

## 2023-03-17 NOTE — Progress Notes (Signed)
*  PRELIMINARY RESULTS* Echocardiogram 2D Echocardiogram has been performed.  Kelly Donaldson 03/17/2023, 1:52 PM

## 2023-03-17 NOTE — Patient Instructions (Signed)
Nice to see you again  Use Flonase 1 spray each bottle twice a day for 1 week then decrease to 1 spray each nostril once a day  Find Afrin, this is over-the-counter.  Use Afrin 2 sprays each nostril twice a day for 3 days then stop.  Take Zyrtec 1 tablet for bed, or once a day if there is a lot of time a day once more convenient for you.  Let see if the combination of increase in the Flonase, adding Afrin for 3 days, and starting Zyrtec to decrease the nasal congestion and help with your allergy symptoms.  No other changes, samples of Breztri today.  Return to clinic in 3 months or sooner as needed with Dr. Judeth Horn

## 2023-03-19 ENCOUNTER — Other Ambulatory Visit: Payer: Self-pay

## 2023-03-19 ENCOUNTER — Telehealth: Payer: Self-pay | Admitting: *Deleted

## 2023-03-19 MED ORDER — BREZTRI AEROSPHERE 160-9-4.8 MCG/ACT IN AERO: 2.0000 | INHALATION_SPRAY | Freq: Two times a day (BID) | RESPIRATORY_TRACT | 12 refills | Status: DC

## 2023-03-19 NOTE — Telephone Encounter (Signed)
RHC auth  Approved Authorization #[id number]  Tracking [id number]

## 2023-03-20 ENCOUNTER — Encounter: Payer: Self-pay | Admitting: Family Medicine

## 2023-03-20 ENCOUNTER — Ambulatory Visit (INDEPENDENT_AMBULATORY_CARE_PROVIDER_SITE_OTHER): Payer: Medicare PPO | Admitting: Family Medicine

## 2023-03-20 VITALS — BP 95/59 | HR 73 | Temp 98.3°F | Ht 67.0 in | Wt 214.0 lb

## 2023-03-20 DIAGNOSIS — J9611 Chronic respiratory failure with hypoxia: Secondary | ICD-10-CM

## 2023-03-20 DIAGNOSIS — R32 Unspecified urinary incontinence: Secondary | ICD-10-CM | POA: Diagnosis not present

## 2023-03-20 DIAGNOSIS — Z23 Encounter for immunization: Secondary | ICD-10-CM

## 2023-03-20 DIAGNOSIS — Z9981 Dependence on supplemental oxygen: Secondary | ICD-10-CM | POA: Diagnosis not present

## 2023-03-20 DIAGNOSIS — I5032 Chronic diastolic (congestive) heart failure: Secondary | ICD-10-CM

## 2023-03-20 DIAGNOSIS — M85851 Other specified disorders of bone density and structure, right thigh: Secondary | ICD-10-CM

## 2023-03-20 DIAGNOSIS — Z0001 Encounter for general adult medical examination with abnormal findings: Secondary | ICD-10-CM | POA: Diagnosis not present

## 2023-03-20 DIAGNOSIS — I270 Primary pulmonary hypertension: Secondary | ICD-10-CM | POA: Diagnosis not present

## 2023-03-20 DIAGNOSIS — Z79899 Other long term (current) drug therapy: Secondary | ICD-10-CM

## 2023-03-20 DIAGNOSIS — F5104 Psychophysiologic insomnia: Secondary | ICD-10-CM

## 2023-03-20 DIAGNOSIS — F331 Major depressive disorder, recurrent, moderate: Secondary | ICD-10-CM

## 2023-03-20 DIAGNOSIS — Z0000 Encounter for general adult medical examination without abnormal findings: Secondary | ICD-10-CM

## 2023-03-20 DIAGNOSIS — H50111 Monocular exotropia, right eye: Secondary | ICD-10-CM

## 2023-03-20 MED ORDER — ZOLPIDEM TARTRATE 5 MG PO TABS: 5.0000 mg | ORAL_TABLET | Freq: Every evening | ORAL | 5 refills | Status: DC | PRN

## 2023-03-20 MED ORDER — CITALOPRAM HYDROBROMIDE 20 MG PO TABS: 20.0000 mg | ORAL_TABLET | Freq: Every day | ORAL | 3 refills | Status: DC

## 2023-03-20 MED ORDER — PRAVASTATIN SODIUM 20 MG PO TABS: 20.0000 mg | ORAL_TABLET | Freq: Every day | ORAL | 3 refills | Status: DC

## 2023-03-20 NOTE — Progress Notes (Signed)
Kelly Donaldson is a 67 y.o. female presents to office today for annual physical exam examination.    Concerns today include: 1.  Urinary incontinence Patient reports that she wakes up wet in her groin region every night.  She is not sure if she is just excessively sweating but she notes that she has no sweat anywhere else.  She thinks that she is having urinary accidents at night.  She denies any urinary frequency or incontinence during the daytime.  No dysuria, hematuria, abnormal vaginal discharge or palpable bulges within the vagina  Occupation: retired, Marital status: married, Substance use: none Diet: typical [place], Exercise: none Last eye exam: due Last colonoscopy: UTD Last mammogram: UTD Last pap smear: n/a Refills needed today: all Immunizations needed: Immunization History  Administered Date(s) Administered   Fluad Quad(high Dose 65+) 09/27/2022   Influenza Split 09/16/2014, 09/20/2015, 09/08/2017, 10/04/2017   Influenza,inj,Quad PF,6+ Mos 09/04/2016, 11/12/2018, 08/17/2019, 09/07/2020   Influenza-Unspecified 10/13/2017, 08/29/2021   Moderna Sars-Covid-2 Vaccination 02/21/2020, 03/20/2020, 12/16/2020   PNEUMOCOCCAL CONJUGATE-20 03/27/2022   Pneumococcal Polysaccharide-23 11/11/2019   Zoster Recombinat (Shingrix) 09/20/2020, 04/17/2021     Past Medical History:  Diagnosis Date   Allergy    Depression    GERD (gastroesophageal reflux disease)    Hyperlipidemia    Oxygen dependent    Shortness of breath dyspnea    Varicose veins    Social History   Socioeconomic History   Marital status: Married    Spouse name: Chanetta Marshall   Number of children: 3   Years of education: Not on file   Highest education level: 12th grade  Occupational History   Occupation: Disabled  Tobacco Use   Smoking status: Former    Packs/day: 0.25    Years: 17.00    Additional pack years: 0.00    Total pack years: 4.25    Types: Cigarettes    Quit date: 03/02/2015    Years since  quitting: 8.0   Smokeless tobacco: Never  Vaping Use   Vaping Use: Never used  Substance and Sexual Activity   Alcohol use: No    Alcohol/week: 0.0 standard drinks of alcohol   Drug use: Yes    Types: Cocaine    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 3 children. Enjoys gardening and spending time with grandchildren.    Social Determinants of Health   Financial Resource Strain: Low Risk  (06/07/2019)   Overall Financial Resource Strain (CARDIA)    Difficulty of Paying Living Expenses: Not hard at all  Food Insecurity: No Food Insecurity (06/07/2019)   Hunger Vital Sign    Worried About Running Out of Food in the Last Year: Never true    Ran Out of Food in the Last Year: Never true  Transportation Needs: No Transportation Needs (06/07/2019)   PRAPARE - Administrator, Civil Service (Medical): No    Lack of Transportation (Non-Medical): No  Physical Activity: Inactive (06/07/2019)   Exercise Vital Sign    Days of Exercise per Week: 0 days    Minutes of Exercise per Session: 0 min  Stress: No Stress Concern Present (06/07/2019)   [employer] of Occupational Health - Occupational Stress Questionnaire    Feeling of Stress : Not at all  Social Connections: Moderately Integrated (06/07/2019)   Social Connection and Isolation Panel [NHANES]  Frequency of Communication with Friends and Family: More than three times a week    Frequency of Social Gatherings with Friends and Family: More than three times a week    Attends Religious Services: More than 4 times per year    Active Member of Clubs or Organizations: No    Attends Banker Meetings: Never    Marital Status: Married  Catering manager Violence: Unknown (06/07/2019)   Humiliation, Afraid, Rape, and Kick questionnaire    Fear of Current or Ex-Partner: No     Emotionally Abused: No    Physically Abused: No    Sexually Abused: Not on file   Past Surgical History:  Procedure Laterality Date   CARDIAC CATHETERIZATION N/A 04/20/2015   Procedure: Right Heart Cath;  Surgeon: Laurey Morale, MD;  Location: [facility] INVASIVE CV LAB;  Service: Cardiovascular;  Laterality: N/A;   RIGHT HEART CATH N/A 06/13/2017   Procedure: Right Heart Cath;  Surgeon: Laurey Morale, MD;  Location: [facility] INVASIVE CV LAB;  Service: Cardiovascular;  Laterality: N/A;   RIGHT HEART CATH N/A 05/06/2018   Procedure: RIGHT HEART CATH;  Surgeon: Laurey Morale, MD;  Location: [facility] INVASIVE CV LAB;  Service: Cardiovascular;  Laterality: N/A;   TEE WITHOUT CARDIOVERSION N/A 05/11/2015   Procedure: TRANSESOPHAGEAL ECHOCARDIOGRAM (TEE);  Surgeon: Laurey Morale, MD;  Location: [facility] ENDOSCOPY;  Service: Cardiovascular;  Laterality: N/A;   TUBAL LIGATION     11/84   Family History  Problem Relation Age of Onset   Hyperlipidemia Mother    Hypertension Mother    Diabetes Mother    Other Mother        varicose veins   Hyperlipidemia Father    Hypertension Father    Emphysema Father        smoked   Hypertension Daughter    Asthma Sister    Clotting disorder Daughter    Breast cancer Maternal Grandmother    Ovarian cancer Maternal Aunt     Current Outpatient Medications:    Accu-Chek Softclix Lancets lancets, Check glucose BID Dx R73.03, Disp: 200 each, Rfl: 3   albuterol (VENTOLIN HFA) 108 (90 Base) MCG/ACT inhaler, INHALE 2 PUFFS EVERY 4 HOURS AS NEEDED FOR WHEEZING OR SHORTNESS OF BREATH, Disp: 18 g, Rfl: 0   Alcohol Swabs (B-D SINGLE USE SWABS REGULAR) PADS, Check glucose BID Dx R73.03, Disp: 200 each, Rfl: 3   aspirin EC 81 MG tablet, Take 1 tablet (81 mg total) by mouth daily., Disp: 90 tablet, Rfl: 3   azaTHIOprine (IMURAN) 50 MG tablet, , Disp: , Rfl:    Blood Glucose Monitoring Suppl (ACCU-CHEK AVIVA PLUS) w/Device KIT, Check glucose BID Dx R73.03, Disp: 1 kit, Rfl: 0    Budeson-Glycopyrrol-Formoterol (BREZTRI AEROSPHERE) 160-9-4.8 MCG/ACT AERO, Inhale 2 puffs into the lungs in the morning and at bedtime., Disp: 10.7 g, Rfl: 12   citalopram (CELEXA) 20 MG tablet, TAKE 1 TABLET BY MOUTH EVERY DAY, Disp: 90 tablet, Rfl: 1   ferrous sulfate (FERROUSUL) 325 (65 FE) MG tablet, Take 1 tablet (325 mg total) by mouth daily with breakfast., Disp: 90 tablet, Rfl: 3   fluticasone (FLONASE) 50 MCG/ACT nasal spray, Place 1 spray into both nostrils daily., Disp: 48 g, Rfl: 3   glucose blood (ACCU-CHEK AVIVA PLUS) test strip, Check glucose BID Dx R73.03, Disp: 200 each, Rfl: 3   HYDROcodone-acetaminophen (NORCO) 10-325 MG tablet, Take 0.5-1 tablets by mouth up to 4- 5 times daily only as needed for chronic multifactorial  pain., Disp: 150 tablet, Rfl: 0   ipratropium-albuterol (DUONEB) 0.5-2.5 (3) MG/3ML SOLN, INHALE THE CONTENTS OF 1 VIAL VIA NEBULIZER FOUR TIMES DAILY AS NEEDED, Disp: 360 mL, Rfl: 1   Lancets Misc. (ACCU-CHEK SOFTCLIX LANCET DEV) KIT, Check glucose BID Dx R73.03, Disp: 1 kit, Rfl: 0   loratadine (CLARITIN) 10 MG tablet, Take 1 tablet (10 mg total) by mouth daily., Disp: 90 tablet, Rfl: 3   macitentan (OPSUMIT) 10 MG tablet, TAKE 1 TABLET ( ) BY MOUTH DAILY, Disp: 30 tablet, Rfl: 11   montelukast (SINGULAIR) 10 MG tablet, Take 1 tablet (10 mg total) by mouth at bedtime., Disp: 30 tablet, Rfl: 11   OXYGEN, Take 2.5-4 L by mouth See admin instructions. 2.5 L with resting state & 4 L with exertion [facility], Disp: , Rfl:    Polyethyl Glycol-Propyl Glycol 0.4-0.3 % SOLN, Place 1 drop into both eyes daily., Disp: , Rfl:    potassium chloride SA (KLOR-CON M) 20 MEQ tablet, Take 2 tablets (40 mEq total) by mouth every morning AND 1 tablet (20 mEq total) every evening., Disp: 180 tablet, Rfl: 3   pravastatin (PRAVACHOL) 20 MG tablet, Take 1 tablet (20 mg total) by mouth daily., Disp: 90 tablet, Rfl: 3   promethazine-dextromethorphan (PROMETHAZINE-DM) 6.25-15 MG/5ML  syrup, Take 2.5 mLs by mouth 4 (four) times daily as needed for cough., Disp: 240 mL, Rfl: 0   Riociguat (ADEMPAS) 2.5 MG TABS, Take 2.5 mg by mouth in the morning, at noon, and at bedtime., Disp: 200 tablet, Rfl: 4   Selexipag (UPTRAVI) 200 MCG TABS, Take 1 tablet (200 mcg total) by mouth 2 (two) times daily., Disp: 180 tablet, Rfl: 3   torsemide (DEMADEX) 20 MG tablet, Take 3 tablets (60 mg total) by mouth in the morning AND 2 tablets (40 mg total) every evening., Disp: 360 tablet, Rfl: 3   Vitamin D, Ergocalciferol, (DRISDOL) 1.25 MG (50000 UNIT) CAPS capsule, Take 1 capsule (50,000 Units total) by mouth every 7 (seven) days., Disp: 12 capsule, Rfl: 3   zolpidem (AMBIEN) 5 MG tablet, Take 1 tablet (5 mg total) by mouth at bedtime as needed for sleep (please allow to fill every 30 days)., Disp: 30 tablet, Rfl: 5  Allergies  Allergen Reactions   Gabapentin Other (See Comments)    Pt states that she can not take with antidepressants   Latex Rash     ROS: Review of Systems A comprehensive review of systems was negative except for: Eyes: positive for contacts/glasses and lazy eye on right Respiratory: positive for dyspnea on exertion and chronic O2 use Genitourinary: positive for urinary incontinence at night time only    Physical exam BP (!) 95/59   Pulse 73   Temp 98.3 F (36.8 C)   Ht  (1.702 m)   Wt 214 lb (97.1 kg)   SpO2 90%   BMI 33.52 kg/m  General appearance: alert, cooperative, appears stated age, and no distress Head: Normocephalic, without obvious abnormality, atraumatic Eyes: negative findings: lids and lashes normal, conjunctivae and sclerae normal, corneas clear, pupils equal, round, reactive to light and accomodation, and exotropia of the right eye appreciated Ears: normal TM's and external ear canals both ears Nose: Nares normal. Septum midline. Mucosa normal. No drainage or sinus tenderness. Throat: lips, mucosa, and tongue normal; teeth and gums normal Neck:  no adenopathy, supple, symmetrical, trachea midline, and thyroid not enlarged, symmetric, no tenderness/mass/nodules Back: symmetric, no curvature. ROM normal. No CVA tenderness. Lungs:  No wheezes, rhonchi or  rales.  She has normal work of breathing on supplemental oxygen Heart: regular rate and rhythm, S1, S2 normal, no murmur, click, rub or gallop Abdomen: soft, non-tender; bowel sounds normal; no masses,  no organomegaly Extremities: extremities normal, atraumatic, no cyanosis or edema Pulses: 2+ and symmetric Skin: Skin color, texture, turgor normal. No rashes or lesions Lymph nodes: Cervical, supraclavicular, and axillary nodes normal. Neurologic: Grossly normal except for the Exotropia of the right eye Psych: Mood stable, speech normal, affect appropriate.  Very pleasant, interactive   Assessment/ Plan: Amalia Haileyeborah L Guilbault here for annual physical exam.   Encounter for annual physical exam  Chronic respiratory failure with hypoxia  Supplemental oxygen dependent  Primary pulmonary hypertension  Chronic diastolic CHF (congestive heart failure) - Plan: pravastatin (PRAVACHOL) 20 MG tablet  Psychophysiological insomnia - Plan: ToxASSURE Select 13 (MW), Urine, zolpidem (AMBIEN) 5 MG tablet  Controlled substance agreement signed - Plan: ToxASSURE Select 13 (MW), Urine  Moderate episode of recurrent major depressive disorder - Plan: citalopram (CELEXA) 20 MG tablet  Osteopenia of neck of right femur  Exotropia of right eye  Urinary incontinence, unspecified type - Plan: Ambulatory referral to Urology  Overall doing well.  Her chronic pulmonary issues are stable. Continues to use supplemental O2  Insomnia is chronic and stable.  Ambien renewed.  UDS and CSC were updated as per office policy.  National narcotic database reviewed and there were no red flags  Depression is chronic and stable.  Celexa renewed and sent to mail order pharmacy  Not due for DEXA scan today.  Will plan  for vitamin D with next laboratory checkup since she was up-to-date on everything else  Has exotropia of the right eye and this is managed by her optometrist/ophthalmologist  With regards to her urinary incontinence, I placed a referral to urology.  This seems to be only a nighttime urinary issue so I did discuss potential need for pelvic floor rehabilitation, continued emptying of bladder prior to bedtime and avoidance of fluids at bedtime  Counseled on healthy lifestyle choices, including diet (rich in fruits, vegetables and lean meats and low in salt and simple carbohydrates) and exercise (at least 30 minutes of moderate physical activity daily).  Patient to follow up in [id number]  Houston Zapien M. Nadine CountsGottschalk, DO

## 2023-03-24 ENCOUNTER — Telehealth (HOSPITAL_COMMUNITY): Payer: Self-pay

## 2023-03-24 NOTE — Telephone Encounter (Signed)
ERROR

## 2023-03-24 NOTE — Telephone Encounter (Addendum)
Spoke with with Kelly Donaldson and informed him to date and time change. He said he will inform his wife and that she will call the office later today  [name] has to change her RHC time and date. New time and date is 03/27/23 at 7.30 am, arrival time is 05.30am

## 2023-03-26 LAB — TOXASSURE SELECT 13 (MW), URINE

## 2023-03-27 ENCOUNTER — Encounter (HOSPITAL_COMMUNITY): Payer: Self-pay | Admitting: Cardiology

## 2023-03-27 ENCOUNTER — Other Ambulatory Visit: Payer: Self-pay

## 2023-03-27 ENCOUNTER — Ambulatory Visit (HOSPITAL_COMMUNITY)
Admission: RE | Admit: 2023-03-27 | Discharge: 2023-03-27 | Disposition: A | Discharge status: Home / Self Care | Payer: Medicare PPO | Attending: Cardiology | Admitting: Cardiology

## 2023-03-27 ENCOUNTER — Encounter (HOSPITAL_COMMUNITY)
Admission: RE | Disposition: A | Discharge status: Home / Self Care | Payer: Self-pay | Source: Home / Self Care | Attending: Cardiology

## 2023-03-27 DIAGNOSIS — J449 Chronic obstructive pulmonary disease, unspecified: Secondary | ICD-10-CM | POA: Insufficient documentation

## 2023-03-27 DIAGNOSIS — Z87891 Personal history of nicotine dependence: Secondary | ICD-10-CM | POA: Insufficient documentation

## 2023-03-27 DIAGNOSIS — J8489 Other specified interstitial pulmonary diseases: Secondary | ICD-10-CM | POA: Diagnosis not present

## 2023-03-27 DIAGNOSIS — M069 Rheumatoid arthritis, unspecified: Secondary | ICD-10-CM | POA: Diagnosis not present

## 2023-03-27 DIAGNOSIS — E785 Hyperlipidemia, unspecified: Secondary | ICD-10-CM | POA: Insufficient documentation

## 2023-03-27 DIAGNOSIS — Z79899 Other long term (current) drug therapy: Secondary | ICD-10-CM | POA: Diagnosis not present

## 2023-03-27 DIAGNOSIS — I270 Primary pulmonary hypertension: Secondary | ICD-10-CM

## 2023-03-27 DIAGNOSIS — Z9981 Dependence on supplemental oxygen: Secondary | ICD-10-CM | POA: Insufficient documentation

## 2023-03-27 DIAGNOSIS — I444 Left anterior fascicular block: Secondary | ICD-10-CM | POA: Insufficient documentation

## 2023-03-27 DIAGNOSIS — I272 Pulmonary hypertension, unspecified: Secondary | ICD-10-CM | POA: Insufficient documentation

## 2023-03-27 DIAGNOSIS — J9611 Chronic respiratory failure with hypoxia: Secondary | ICD-10-CM | POA: Insufficient documentation

## 2023-03-27 DIAGNOSIS — D869 Sarcoidosis, unspecified: Secondary | ICD-10-CM

## 2023-03-27 HISTORY — PX: RIGHT HEART CATH: CATH118263

## 2023-03-27 LAB — POCT I-STAT EG7
Acid-Base Excess: 1 mmol/L (ref 0.0–2.0)
Acid-Base Excess: 1 mmol/L (ref 0.0–2.0)
Bicarbonate: 25.8 mmol/L (ref 20.0–28.0)
Bicarbonate: 26.3 mmol/L (ref 20.0–28.0)
Calcium, Ion: 1.2 mmol/L (ref 1.15–1.40)
Calcium, Ion: 1.21 mmol/L (ref 1.15–1.40)
HCT: 31 % — ABNORMAL LOW (ref 36.0–46.0)
HCT: 31 % — ABNORMAL LOW (ref 36.0–46.0)
Hemoglobin: 10.5 g/dL — ABNORMAL LOW (ref 12.0–15.0)
Hemoglobin: 10.5 g/dL — ABNORMAL LOW (ref 12.0–15.0)
O2 Saturation: 70 %
O2 Saturation: 70 %
Potassium: 3.5 mmol/L (ref 3.5–5.1)
Potassium: 3.5 mmol/L (ref 3.5–5.1)
Sodium: 138 mmol/L (ref 135–145)
Sodium: 138 mmol/L (ref 135–145)
TCO2: 27 mmol/L (ref 22–32)
TCO2: 28 mmol/L (ref 22–32)
pCO2, Ven: 41.8 mmHg — ABNORMAL LOW (ref 44–60)
pCO2, Ven: 42.2 mmHg — ABNORMAL LOW (ref 44–60)
pH, Ven: 7.399 (ref 7.25–7.43)
pH, Ven: 7.402 (ref 7.25–7.43)
pO2, Ven: 37 mmHg (ref 32–45)
pO2, Ven: 37 mmHg (ref 32–45)

## 2023-03-27 LAB — CBC
HCT: 34.3 % — ABNORMAL LOW (ref 36.0–46.0)
Hemoglobin: 10.5 g/dL — ABNORMAL LOW (ref 12.0–15.0)
MCH: 27 pg (ref 26.0–34.0)
MCHC: 30.6 g/dL (ref 30.0–36.0)
MCV: 88.2 fL (ref 80.0–100.0)
Platelets: 192 10*3/uL (ref 150–400)
RBC: 3.89 MIL/uL (ref 3.87–5.11)
RDW: 15.1 % (ref 11.5–15.5)
WBC: 5.1 10*3/uL (ref 4.0–10.5)
nRBC: 0 % (ref 0.0–0.2)

## 2023-03-27 SURGERY — RIGHT HEART CATH
Anesthesia: LOCAL

## 2023-03-27 MED ORDER — SODIUM CHLORIDE 0.9 % IV SOLN: 250.0000 mL | INTRAVENOUS | Status: DC | PRN

## 2023-03-27 MED ORDER — ACETAMINOPHEN 325 MG PO TABS: 650.0000 mg | ORAL_TABLET | ORAL | Status: DC | PRN

## 2023-03-27 MED ORDER — SODIUM CHLORIDE 0.9% FLUSH: 3.0000 mL | Freq: Two times a day (BID) | INTRAVENOUS | Status: DC

## 2023-03-27 MED ORDER — TORSEMIDE 20 MG PO TABS: 40.0000 mg | ORAL_TABLET | Freq: Two times a day (BID) | ORAL | 3 refills | Status: DC

## 2023-03-27 MED ORDER — ONDANSETRON HCL 4 MG/2ML IJ SOLN: 4.0000 mg | Freq: Four times a day (QID) | INTRAMUSCULAR | Status: DC | PRN

## 2023-03-27 MED ORDER — HEPARIN (PORCINE) IN NACL 1000-0.9 UT/500ML-% IV SOLN
INTRAVENOUS | Status: DC | PRN
  Administered 2023-03-27: 500 mL

## 2023-03-27 MED ORDER — SODIUM CHLORIDE 0.9 % IV SOLN: INTRAVENOUS | Status: DC

## 2023-03-27 MED ORDER — SODIUM CHLORIDE 0.9% FLUSH: 3.0000 mL | INTRAVENOUS | Status: DC | PRN

## 2023-03-27 MED ORDER — LABETALOL HCL 5 MG/ML IV SOLN: 10.0000 mg | INTRAVENOUS | Status: DC | PRN

## 2023-03-27 MED ORDER — HYDRALAZINE HCL 20 MG/ML IJ SOLN: 10.0000 mg | INTRAMUSCULAR | Status: DC | PRN

## 2023-03-27 MED ORDER — LIDOCAINE HCL (PF) 1 % IJ SOLN
INTRAMUSCULAR | Status: DC | PRN
  Administered 2023-03-27: 5 mL
  Administered 2023-03-27: 2 mL

## 2023-03-27 MED ORDER — LIDOCAINE HCL (PF) 1 % IJ SOLN
INTRAMUSCULAR | Status: AC
  Filled 2023-03-27: qty 30

## 2023-03-27 SURGICAL SUPPLY — 4 items
CATH BALLN WEDGE 5F 110CM (CATHETERS) IMPLANT
PACK CARDIAC CATHETERIZATION (CUSTOM PROCEDURE TRAY) ×1 IMPLANT
SHEATH GLIDE SLENDER 4/5FR (SHEATH) IMPLANT
TRANSDUCER W/STOPCOCK (MISCELLANEOUS) ×1 IMPLANT

## 2023-03-27 NOTE — Discharge Instructions (Addendum)
Increase torsemide from 40 mg in the morning and 20 mg in the afternoon to 40 mg twice a day.  Brachial Site Care   This sheet gives you information about how to care for yourself after your procedure. Your health care provider may also give you more specific instructions. If you have problems or questions, contact your health care provider. What can I expect after the procedure? After the procedure, it is common to have: Bruising and tenderness at the catheter insertion area. Follow these instructions at home:  Insertion site care Follow instructions from your health care provider about how to take care of your insertion site. Make sure you: Wash your hands with soap and water before you change your bandage (dressing). If soap and water are not available, use hand sanitizer. Remove your dressing as told by your health care provider. In 24 hours Check your insertion site every day for signs of infection. Check for: Redness, swelling, or pain. Pus or a bad smell. Warmth. You may shower 24-48 hours after the procedure. Do not apply powder or lotion to the site.  Activity For 24 hours after the procedure, or as directed by your health care provider: Do not push or pull heavy objects with the affected arm. Do not drive yourself home from the hospital or clinic. You may drive 24 hours after the procedure unless your health care provider tells you not to. Do not lift anything that is heavier than 10 lb (4.5 kg), or the limit that you are told, until your health care provider says that it is safe.  For 24 hours

## 2023-03-27 NOTE — Interval H&P Note (Signed)
History and Physical Interval Note:  03/27/2023 7:31 AM  Kelly Donaldson  has presented today for surgery, with the diagnosis of hp.  The various methods of treatment have been discussed with the patient and family. After consideration of risks, benefits and other options for treatment, the patient has consented to  Procedure(s): RIGHT HEART CATH (N/A) as a surgical intervention.  The patient's history has been reviewed, patient examined, no change in status, stable for surgery.  I have reviewed the patient's chart and labs.  Questions were answered to the patient's satisfaction.     Shai Rasmussen [employer]

## 2023-03-28 ENCOUNTER — Ambulatory Visit: Payer: Medicare PPO | Admitting: Family Medicine

## 2023-03-28 ENCOUNTER — Encounter: Payer: Medicare PPO | Admitting: Family Medicine

## 2023-03-28 ENCOUNTER — Telehealth (HOSPITAL_COMMUNITY): Payer: Self-pay

## 2023-03-28 NOTE — Telephone Encounter (Signed)
Left message for patient to call office back.

## 2023-03-28 NOTE — Telephone Encounter (Signed)
-----   Message from Laurey Morale, MD sent at 03/27/2023  8:22 AM EDT ----- RHC today, she needs to increase torsemide to 40 mg bid and needs to get a BMET in 10 days.  Can do locally.

## 2023-04-01 ENCOUNTER — Encounter (HOSPITAL_COMMUNITY): Payer: Self-pay

## 2023-04-01 ENCOUNTER — Telehealth (HOSPITAL_COMMUNITY): Payer: Self-pay

## 2023-04-01 NOTE — Telephone Encounter (Signed)
Called patient to let her know lab script placed in  mail for her to get done in 10 days locally.

## 2023-04-02 ENCOUNTER — Telehealth (HOSPITAL_COMMUNITY): Payer: Self-pay

## 2023-04-02 NOTE — Telephone Encounter (Signed)
Hey she is calling back. I see notes in system, but unsure what labs we called about. Can you call her back or let me know what I can tell her and I will.

## 2023-04-29 ENCOUNTER — Other Ambulatory Visit: Payer: Self-pay | Admitting: Family Medicine

## 2023-04-29 ENCOUNTER — Telehealth (HOSPITAL_COMMUNITY): Payer: Self-pay

## 2023-04-29 DIAGNOSIS — F5104 Psychophysiologic insomnia: Secondary | ICD-10-CM

## 2023-04-29 NOTE — Telephone Encounter (Signed)
Oxygen therapy faxed to [facility] [phone] Confirmation received

## 2023-05-01 ENCOUNTER — Other Ambulatory Visit (HOSPITAL_COMMUNITY): Payer: Self-pay | Admitting: Cardiology

## 2023-05-01 ENCOUNTER — Ambulatory Visit (HOSPITAL_COMMUNITY)
Admission: RE | Admit: 2023-05-01 | Discharge: 2023-05-01 | Disposition: A | Discharge status: Home / Self Care | Payer: Medicare PPO | Source: Ambulatory Visit | Attending: Cardiology | Admitting: Cardiology

## 2023-05-01 DIAGNOSIS — I5032 Chronic diastolic (congestive) heart failure: Secondary | ICD-10-CM

## 2023-05-01 LAB — BASIC METABOLIC PANEL
Anion gap: 12 (ref 5–15)
BUN: 12 mg/dL (ref 8–23)
CO2: 23 mmol/L (ref 22–32)
Calcium: 9.1 mg/dL (ref 8.9–10.3)
Chloride: 101 mmol/L (ref 98–111)
Creatinine, Ser: 1 mg/dL (ref 0.44–1.00)
GFR, Estimated: >60 mL/min (ref >60)
Glucose, Bld: 94 mg/dL (ref 70–99)
Potassium: 3.9 mmol/L (ref 3.5–5.1)
Sodium: 136 mmol/L (ref 135–145)

## 2023-05-02 ENCOUNTER — Other Ambulatory Visit: Payer: Self-pay | Admitting: Pulmonary Disease

## 2023-05-02 ENCOUNTER — Other Ambulatory Visit (HOSPITAL_COMMUNITY): Payer: Medicare PPO

## 2023-05-14 ENCOUNTER — Ambulatory Visit: Payer: Medicare PPO | Admitting: Urology

## 2023-06-19 ENCOUNTER — Other Ambulatory Visit (HOSPITAL_COMMUNITY): Payer: Self-pay | Admitting: Cardiology

## 2023-06-19 DIAGNOSIS — I270 Primary pulmonary hypertension: Secondary | ICD-10-CM

## 2023-06-25 ENCOUNTER — Encounter: Payer: Self-pay | Admitting: Family Medicine

## 2023-06-25 ENCOUNTER — Telehealth: Payer: Medicare PPO | Admitting: Family Medicine

## 2023-06-25 DIAGNOSIS — U071 COVID-19: Secondary | ICD-10-CM | POA: Diagnosis not present

## 2023-06-25 MED ORDER — PREDNISONE 10 MG PO TABS: ORAL_TABLET | ORAL | 0 refills | Status: DC

## 2023-06-25 MED ORDER — CHERATUSSIN AC 100-10 MG/5ML PO SOLN: 5.0000 mL | ORAL | 0 refills | Status: DC | PRN

## 2023-06-25 MED ORDER — NIRMATRELVIR/RITONAVIR (PAXLOVID)TABLET: 3.0000 | ORAL_TABLET | Freq: Two times a day (BID) | ORAL | 0 refills | Status: AC

## 2023-06-25 NOTE — Progress Notes (Signed)
Subjective:    Patient ID: Kelly Donaldson, female    DOB: [birth year], 67 y.o.   MRN: [id number]   HPI: Kelly Donaldson is a 67 y.o. female presenting for covid. Onset this morning. Took home test  and positive for covid. Pt. Uses O2 for COPD. Coughing and congested with a monster headache. Breathing is alright compared to her baseline      03/20/2023   10:13 AM 09/27/2022   10:15 AM 03/27/2022   11:22 AM 10/29/2021    1:27 PM 07/03/2021   10:45 AM  Depression screen PHQ 2/9  Decreased Interest 1 1 0 1 0  Down, Depressed, Hopeless 1 1 1  0 0  PHQ - 2 Score 2 2 1 1  0  Altered sleeping 0 0  0 1  Tired, decreased energy 0 0  2 1  Change in appetite 0 0  1 1  Feeling bad or failure about yourself  0 0  0 0  Trouble concentrating 0 0  0 0  Moving slowly or fidgety/restless 0 0  0 0  Suicidal thoughts 0 0  0 0  PHQ-9 Score 2 2  4 3   Difficult doing work/chores Not difficult at all Not difficult at all  Somewhat difficult Not difficult at all     Relevant past medical, surgical, family and social history reviewed and updated as indicated.  Interim medical history since our last visit reviewed. Allergies and medications reviewed and updated.  ROS:  Review of Systems  Constitutional:  Negative for activity change, appetite change, chills and fever.  HENT:  Positive for congestion, postnasal drip, rhinorrhea and sinus pressure. Negative for ear discharge, ear pain, hearing loss, nosebleeds, sneezing and trouble swallowing.   Respiratory:  Negative for chest tightness and shortness of breath.   Cardiovascular:  Negative for chest pain and palpitations.  Skin:  Negative for rash.     Social History   Tobacco Use  Smoking Status Former   Current packs/day: 0.00   Average packs/day: 0.3 packs/day for 17.0 years (4.3 ttl pk-yrs)   Types: Cigarettes   Start date: 03/01/1998   Quit date: 03/02/2015   Years since quitting: 8.3  Smokeless Tobacco Never       Objective:     Wt  Readings from Last 3 Encounters:  03/27/23 214 lb (97.1 kg)  03/20/23 214 lb (97.1 kg)  03/17/23 215 lb (97.5 kg)     Exam deferred. Pt. Harboring due to COVID 19. Phone visit performed.   Assessment & Plan:  No diagnosis found.  Meds ordered this encounter  Medications   nirmatrelvir/ritonavir (PAXLOVID) 20 x 150 MG & 10 x 100MG  TABS    Sig: Take 3 tablets by mouth 2 (two) times daily for 5 days. (Take nirmatrelvir 150 mg two tablets twice daily for 5 days and ritonavir 100 mg one tablet twice daily for 5 days) Patient GFR is    Dispense:  30 tablet    Refill:  0   guaiFENesin-codeine (CHERATUSSIN AC) 100-10 MG/5ML syrup    Sig: Take 5 mLs by mouth every 4 (four) hours as needed for cough.    Dispense:  180 mL    Refill:  0   predniSONE (DELTASONE) 10 MG tablet    Sig: Take 5 daily for 2 days followed by 4,3,2 and 1 for 2 days each.    Dispense:  30 tablet    Refill:  0    No orders of the defined types  were placed in this encounter.     There are no diagnoses linked to this encounter.  Virtual Visit via telephone Note  I discussed the limitations, risks, security and privacy concerns of performing an evaluation and management service by telephone and the availability of in person appointments. The patient was identified with two identifiers. Pt.expressed understanding and agreed to proceed. Pt. Is at home. Dr. Darlyn Read is in his office.  Follow Up Instructions:   I discussed the assessment and treatment plan with the patient. The patient was provided an opportunity to ask questions and all were answered. The patient agreed with the plan and demonstrated an understanding of the instructions.   The patient was advised to call back or seek an in-person evaluation if the symptoms worsen or if the condition fails to improve as anticipated.   Total minutes including chart review and phone contact time: 12   Follow up plan: Return if symptoms worsen or fail to  improve.  Mechele Claude, MD [place] [facility]

## 2023-09-04 ENCOUNTER — Other Ambulatory Visit: Payer: Self-pay | Admitting: *Deleted

## 2023-09-04 DIAGNOSIS — J3089 Other allergic rhinitis: Secondary | ICD-10-CM

## 2023-09-04 MED ORDER — FLUTICASONE PROPIONATE 50 MCG/ACT NA SUSP
1.0000 | Freq: Every day | NASAL | 1 refills | Status: DC
Start: 2023-09-04 — End: 2023-09-30

## 2023-09-10 ENCOUNTER — Ambulatory Visit: Payer: Medicare PPO

## 2023-09-10 VITALS — Ht 69.0 in | Wt 200.0 lb

## 2023-09-10 DIAGNOSIS — Z0000 Encounter for general adult medical examination without abnormal findings: Secondary | ICD-10-CM

## 2023-09-10 NOTE — Progress Notes (Signed)
Subjective:   Kelly Donaldson is a 67 y.o. female who presents for Medicare Annual (Subsequent) preventive examination.  Visit Complete: Virtual  I connected with  Kelly Donaldson on 09/10/23 by a audio enabled telemedicine application and verified that I am speaking with the correct person using two identifiers.  Patient Location: Home  Provider Location: Home Office  I discussed the limitations of evaluation and management by telemedicine. The patient expressed understanding and agreed to proceed.  Patient Medicare AWV questionnaire was completed by the patient on 09/10/2023; I have confirmed that all information answered by patient is correct and no changes since this date.  Cardiac Risk Factors include: advanced age (>64men, >13 women);dyslipidemia;hypertension     Objective:    Today's Vitals   09/10/23 1335  Weight: 200 lb (90.7 kg)  Height: 5\' 9"  (1.753 m)   Body mass index is 29.53 kg/m.     09/10/2023    1:38 PM 03/27/2023    6:33 AM 06/08/2020    9:49 AM 06/07/2019    9:39 AM 05/06/2018   10:03 AM 06/13/2017   11:42 AM 04/17/2017   11:58 AM  Advanced Directives  Does Patient Have a Medical Advance Directive? Yes Yes Yes No No No Yes  Type of Estate agent of [place];Living will Healthcare Power of [place];Living will Living will    Living will  Does patient want to make changes to medical advance directive?   No - Patient declined    No - Patient declined  Copy of Healthcare Power of Attorney in Chart? No - copy requested        Would patient like information on creating a medical advance directive?    Yes (MAU/Ambulatory/Procedural Areas - Information given) No - Patient declined No - Patient declined     Current Medications (verified) Outpatient Encounter Medications as of 09/10/2023  Medication Sig   Accu-Chek Softclix Lancets lancets Check glucose BID Dx R73.03   albuterol (VENTOLIN HFA) 108 (90 Base) MCG/ACT inhaler INHALE 2 PUFFS EVERY 4  HOURS AS NEEDED FOR WHEEZING OR SHORTNESS OF BREATH   Alcohol Swabs (B-D SINGLE USE SWABS REGULAR) PADS Check glucose BID Dx R73.03   aspirin EC 81 MG tablet Take 1 tablet (81 mg total) by mouth daily.   azaTHIOprine (IMURAN) 50 MG tablet Take 50 mg by mouth in the morning and at bedtime.   Blood Glucose Monitoring Suppl (ACCU-CHEK AVIVA PLUS) w/Device KIT Check glucose BID Dx R73.03   BREZTRI AEROSPHERE 160-9-4.8 MCG/ACT AERO INHALE 2 PUFFS INTO THE LUNGS IN THE MORNING AND AT BEDTIME.   citalopram (CELEXA) 20 MG tablet Take 1 tablet (20 mg total) by mouth daily.   dicyclomine (BENTYL) 20 MG tablet Take 20 mg by mouth daily.   ferrous sulfate (FERROUSUL) 325 (65 FE) MG tablet Take 1 tablet (325 mg total) by mouth daily with breakfast. (Patient taking differently: Take 325 mg by mouth 2 (two) times a week.)   fluticasone (FLONASE) 50 MCG/ACT nasal spray Place 1 spray into both nostrils daily.   glucose blood (ACCU-CHEK AVIVA PLUS) test strip Check glucose BID Dx R73.03   guaiFENesin-codeine (CHERATUSSIN AC) 100-10 MG/5ML syrup Take 5 mLs by mouth every 4 (four) hours as needed for cough.   HYDROcodone-acetaminophen (NORCO) 10-325 MG tablet Take 0.5-1 tablets by mouth up to 4- 5 times daily only as needed for chronic multifactorial pain. (Patient taking differently: Take 0.5-1 tablets by mouth 4 (four) times daily as needed for moderate pain.)  ipratropium-albuterol (DUONEB) 0.5-2.5 (3) MG/3ML SOLN INHALE THE CONTENTS OF 1 VIAL VIA NEBULIZER FOUR TIMES DAILY AS NEEDED   Lancets Misc. (ACCU-CHEK SOFTCLIX LANCET DEV) KIT Check glucose BID Dx R73.03   loratadine (CLARITIN) 10 MG tablet Take 1 tablet (10 mg total) by mouth daily. (Patient taking differently: Take 10 mg by mouth daily as needed for allergies.)   macitentan (OPSUMIT) 10 MG tablet TAKE 1 TABLET (10MG ) BY MOUTH DAILY   montelukast (SINGULAIR) 10 MG tablet Take 1 tablet (10 mg total) by mouth at bedtime.   OXYGEN Take 2.5-4 L by mouth  continuous. 2.5 L with resting state & 4 L with exertion [facility]   Polyethyl Glycol-Propyl Glycol 0.4-0.3 % SOLN Place 1 drop into both eyes daily.   potassium chloride SA (KLOR-CON M) 20 MEQ tablet TAKE 2 TABLETS EVERY MORNING AND TAKE 1 TABLET EVERY EVENING (CHANGE IN DOSE)   pravastatin (PRAVACHOL) 20 MG tablet Take 1 tablet (20 mg total) by mouth daily.   predniSONE (DELTASONE) 10 MG tablet Take 5 daily for 2 days followed by 4,3,2 and 1 for 2 days each.   Riociguat (ADEMPAS) 2.5 MG TABS Take 2.5 mg by mouth in the morning, at noon, and at bedtime.   Selexipag (UPTRAVI) 200 MCG TABS Take 1 tablet (200 mcg total) by mouth 2 (two) times daily.   torsemide (DEMADEX) 20 MG tablet Take 2 tablets (40 mg total) by mouth 2 (two) times daily.   Vitamin D, Ergocalciferol, (DRISDOL) 1.25 MG (50000 UNIT) CAPS capsule Take 1 capsule (50,000 Units total) by mouth every 7 (seven) days.   zolpidem (AMBIEN) 5 MG tablet Take 1 tablet (5 mg total) by mouth at bedtime as needed for sleep (please allow to fill every 30 days). (Patient taking differently: Take 5 mg by mouth at bedtime.)   No facility-administered encounter medications on file as of 09/10/2023.    Allergies (verified) Gabapentin and Latex   History: Past Medical History:  Diagnosis Date   Allergy    Depression    GERD (gastroesophageal reflux disease)    Hyperlipidemia    Oxygen dependent    Shortness of breath dyspnea    Varicose veins    Past Surgical History:  Procedure Laterality Date   CARDIAC CATHETERIZATION N/A 04/20/2015   Procedure: Right Heart Cath;  Surgeon: Laurey Morale, MD;  Location: [facility] INVASIVE CV LAB;  Service: Cardiovascular;  Laterality: N/A;   RIGHT HEART CATH N/A 06/13/2017   Procedure: Right Heart Cath;  Surgeon: Laurey Morale, MD;  Location: [facility] INVASIVE CV LAB;  Service: Cardiovascular;  Laterality: N/A;   RIGHT HEART CATH N/A 05/06/2018   Procedure: RIGHT HEART CATH;  Surgeon: Laurey Morale, MD;   Location: [facility] INVASIVE CV LAB;  Service: Cardiovascular;  Laterality: N/A;   RIGHT HEART CATH N/A 03/27/2023   Procedure: RIGHT HEART CATH;  Surgeon: Laurey Morale, MD;  Location: [facility] INVASIVE CV LAB;  Service: Cardiovascular;  Laterality: N/A;   TEE WITHOUT CARDIOVERSION N/A 05/11/2015   Procedure: TRANSESOPHAGEAL ECHOCARDIOGRAM (TEE);  Surgeon: Laurey Morale, MD;  Location: [facility] ENDOSCOPY;  Service: Cardiovascular;  Laterality: N/A;   TUBAL LIGATION     11/84   Family History  Problem Relation Age of Onset   Hyperlipidemia Mother    Hypertension Mother    Diabetes Mother    Other Mother        varicose veins   Hyperlipidemia Father    Hypertension Father    Emphysema Father  smoked   Hypertension Daughter    Asthma Sister    Clotting disorder Daughter    Breast cancer Maternal Grandmother    Ovarian cancer Maternal Aunt    Social History   Socioeconomic History   Marital status: Married    Spouse name: Chanetta Marshall   Number of children: 3   Years of education: Not on file   Highest education level: 12th grade  Occupational History   Occupation: Disabled  Tobacco Use   Smoking status: Former    Current packs/day: 0.00    Average packs/day: 0.3 packs/day for 17.0 years (4.3 ttl pk-yrs)    Types: Cigarettes    Start date: 03/01/1998    Quit date: 03/02/2015    Years since quitting: 8.5   Smokeless tobacco: Never  Vaping Use   Vaping status: Never Used  Substance and Sexual Activity   Alcohol use: No    Alcohol/week: 0.0 standard drinks of alcohol   Drug use: Yes    Types: Cocaine    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 3 children. Enjoys gardening and spending time with grandchildren.    Social Determinants of Health   Financial Resource Strain: Low Risk  (09/10/2023)   Overall Financial Resource Strain (CARDIA)    Difficulty  of Paying Living Expenses: Not hard at all  Food Insecurity: No Food Insecurity (09/10/2023)   Hunger Vital Sign    Worried About Running Out of Food in the Last Year: Never true    Ran Out of Food in the Last Year: Never true  Transportation Needs: No Transportation Needs (09/10/2023)   PRAPARE - Administrator, Civil Service (Medical): No    Lack of Transportation (Non-Medical): No  Physical Activity: Inactive (09/10/2023)   Exercise Vital Sign    Days of Exercise per Week: 0 days    Minutes of Exercise per Session: 0 min  Stress: No Stress Concern Present (09/10/2023)   [employer] of Occupational Health - Occupational Stress Questionnaire    Feeling of Stress : Not at all  Social Connections: Moderately Integrated (09/10/2023)   Social Connection and Isolation Panel [NHANES]    Frequency of Communication with Friends and Family: More than three times a week    Frequency of Social Gatherings with Friends and Family: More than three times a week    Attends Religious Services: More than 4 times per year    Active Member of [employer] or Organizations: No    Attends Engineer, structural: Never    Marital Status: Married    Tobacco Counseling Counseling given: Not Answered   Clinical Intake:  Pre-visit preparation completed: Yes  Pain : No/denies pain     Nutritional Risks: None Diabetes: No  How often do you need to have someone help you when you read instructions, pamphlets, or other written materials from your doctor or pharmacy?: 1 - Never  Interpreter Needed?: No  Information entered by :: Renie Ora, LPN   Activities of Daily Living    09/10/2023    1:38 PM  In your present state of health, do you have any difficulty performing the following activities:  Hearing? 0  Vision? 0  Difficulty concentrating or making decisions? 0  Walking or climbing stairs? 0  Dressing or bathing? 0  Doing errands, shopping? 0  Preparing Food and eating ?  N   Using the Toilet? N  In the past six months, have you accidently leaked urine? N  Do you have problems with loss of bowel control? N  Managing your Medications? N  Managing your Finances? N  Housekeeping or managing your Housekeeping? N    Patient Care Team: [name], DO as PCP - General (Family Medicine)  Indicate any recent Medical Services you may have received from other than [facility] providers in the past year (date may be approximate).     Assessment:   This is a routine wellness examination for Alissah.  Hearing/Vision screen Vision Screening - Comments:: Wears rx glasses - up to date with routine eye exams with  [facility]    Goals Addressed             This Visit's Progress    Exercise 3x per week (30 min per time)   On track    Try to exercise for at least 30 minutes, 3 time weekly       Depression Screen    09/10/2023    1:37 PM 03/20/2023   10:13 AM 09/27/2022   10:15 AM 03/27/2022   11:22 AM 10/29/2021    1:27 PM 07/03/2021   10:45 AM 05/22/2021    9:48 AM  PHQ 2/9 Scores  PHQ - 2 Score 0 2 2 1 1  0 6  PHQ- 9 Score  2 2  4 3 20     Fall Risk    09/10/2023    1:36 PM 03/20/2023   10:13 AM 09/27/2022   10:15 AM 03/27/2022   11:21 AM 10/29/2021    1:27 PM  Fall Risk   Falls in the past year? 0 0 1 1 1   Number falls in past yr: 0 0 1 1 0  Injury with Fall? 0 0 1 0 0  Risk for fall due to : No Fall Risks No Fall Risks History of fall(s) History of fall(s) No Fall Risks  Follow up Falls prevention discussed Education provided Education provided Education provided Falls evaluation completed    MEDICARE RISK AT HOME: Medicare Risk at Home Any stairs in or around the home?: Yes If so, are there any without handrails?: No Home free of loose throw rugs in walkways, pet beds, electrical cords, etc?: Yes Adequate lighting in your home to reduce risk of falls?: Yes Life alert?: No Use of a cane, walker or w/c?: No Grab bars in the  bathroom?: Yes Shower chair or bench in shower?: Yes Elevated toilet seat or a handicapped toilet?: Yes  TIMED UP AND GO:  Was the test performed?  No    Cognitive Function:        09/10/2023    1:39 PM 06/08/2020    9:53 AM 06/07/2019    9:41 AM  6CIT Screen  What Year? 0 points 0 points 0 points  What month? 0 points 0 points 0 points  What time? 0 points 0 points 0 points  Count back from 20 0 points 0 points 0 points  Months in reverse 0 points 4 points 0 points  Repeat phrase 0 points 0 points 0 points  Total Score 0 points 4 points 0 points    Immunizations Immunization History  Administered Date(s) Administered   Fluad Quad(high Dose 65+) 09/27/2022   Influenza Split 09/16/2014, 09/20/2015, 09/08/2017, 10/04/2017   Influenza,inj,Quad PF,6+ Mos 09/04/2016, 11/12/2018, 08/17/2019, 09/07/2020   Influenza-Unspecified 10/13/2017, 08/29/2021   Moderna Sars-Covid-2 Vaccination 02/21/2020, 03/20/2020, 12/16/2020  PNEUMOCOCCAL CONJUGATE-20 03/27/2022   Pneumococcal Polysaccharide-23 11/11/2019   Tdap 03/20/2023   Zoster Recombinant(Shingrix) 09/20/2020, 04/17/2021    TDAP status: Up to date  Flu Vaccine status: Due, Education has been provided regarding the importance of this vaccine. Advised may receive this vaccine at local pharmacy or Health Dept. Aware to provide a copy of the vaccination record if obtained from local pharmacy or Health Dept. Verbalized acceptance and understanding.  Pneumococcal vaccine status: Up to date  Covid-19 vaccine status: Completed vaccines  Qualifies for Shingles Vaccine? Yes   Zostavax completed Yes   Shingrix Completed?: Yes  Screening Tests Health Maintenance  Topic Date Due   INFLUENZA VACCINE  07/10/2023   COVID-19 Vaccine (4 - 2023-24 season) 08/10/2023   Hepatitis C Screening  09/27/2023 (Originally 03/11/1974)   MAMMOGRAM  11/27/2023   Medicare Annual Wellness (AWV)  09/09/2024   DEXA SCAN  10/15/2024   Colonoscopy   06/20/2026   DTaP/Tdap/Td (2 - Td or Tdap) 03/19/2033   Pneumonia Vaccine 66+ Years old  Completed   Zoster Vaccines- Shingrix  Completed   HPV VACCINES  Aged Out    Health Maintenance  Health Maintenance Due  Topic Date Due   INFLUENZA VACCINE  07/10/2023   COVID-19 Vaccine (4 - 2023-24 season) 08/10/2023    Colorectal cancer screening: Type of screening: Colonoscopy. Completed 06/20/2016. Repeat every 10 years  Mammogram status: Completed 11/26/2022. Repeat every year  Bone Density status: Completed 10/15/2022. Results reflect: Bone density results: OSTEOPOROSIS. Repeat every 2 years.  Lung Cancer Screening: (Low Dose CT Chest recommended if Age 64-80 years, 20 pack-year currently smoking OR have quit w/in 15years.) does not qualify.   Lung Cancer Screening Referral: n/a  Additional Screening:  Hepatitis C Screening: does not qualify; Completed 09/26/2022  Vision Screening: Recommended annual ophthalmology exams for early detection of glaucoma and other disorders of the eye. Is the patient up to date with their annual eye exam?  Yes  Who is the provider or what is the name of the office in which the patient attends annual eye exams? [facility]  If pt is not established with a provider, would they like to be referred to a provider to establish care? No .   Dental Screening: Recommended annual dental exams for proper oral hygiene   Community Resource Referral / Chronic Care Management: CRR required this visit?  No   CCM required this visit?  No     Plan:     I have personally reviewed and noted the following in the patient's chart:   Medical and social history Use of alcohol, tobacco or illicit drugs  Current medications and supplements including opioid prescriptions. Patient is not currently taking opioid prescriptions. Functional ability and status Nutritional status Physical activity Advanced directives List of other  physicians Hospitalizations, surgeries, and ER visits in previous 12 months Vitals Screenings to include cognitive, depression, and falls Referrals and appointments  In addition, I have reviewed and discussed with patient certain preventive protocols, quality metrics, and best practice recommendations. A written personalized care plan for preventive services as well as general preventive health recommendations were provided to patient.     Lorrene Reid, LPN   [id number]   After Visit Summary: (MyChart) Due to this being a telephonic visit, the after visit summary with patients personalized plan was offered to patient via MyChart   Nurse Notes: none

## 2023-09-10 NOTE — Patient Instructions (Signed)
Ms. Mandt , Thank you for taking time to come for your Medicare Wellness Visit. I appreciate your ongoing commitment to your health goals. Please review the following plan we discussed and let me know if I can assist you in the future.   Referrals/Orders/Follow-Ups/Clinician Recommendations: Aim for 30 minutes of exercise or brisk walking, 6-8 glasses of water, and 5 servings of fruits and vegetables each day.   This is a list of the screening recommended for you and due dates:  Health Maintenance  Topic Date Due   Flu Shot  07/10/2023   COVID-19 Vaccine (4 - 2023-24 season) 08/10/2023   Hepatitis C Screening  09/27/2023*   Mammogram  11/27/2023   Medicare Annual Wellness Visit  09/09/2024   DEXA scan (bone density measurement)  10/15/2024   Colon Cancer Screening  06/20/2026   DTaP/Tdap/Td vaccine (2 - Td or Tdap) 03/19/2033   Pneumonia Vaccine  Completed   Zoster (Shingles) Vaccine  Completed   HPV Vaccine  Aged Out  *Topic was postponed. The date shown is not the original due date.    Advanced directives: (Copy Requested) Please bring a copy of your health care power of attorney and living will to the office to be added to your chart at your convenience.  Next Medicare Annual Wellness Visit scheduled for next year: Yes  Insert Preventive Care attachment Insert FALL PREVENTION attachment if needed

## 2023-09-17 ENCOUNTER — Ambulatory Visit: Payer: Medicare PPO | Admitting: Family Medicine

## 2023-09-17 ENCOUNTER — Encounter: Payer: Self-pay | Admitting: Family Medicine

## 2023-09-17 VITALS — BP 88/56 | HR 86 | Temp 98.3°F | Ht 69.0 in | Wt 199.4 lb

## 2023-09-17 DIAGNOSIS — R413 Other amnesia: Secondary | ICD-10-CM

## 2023-09-17 DIAGNOSIS — I2721 Secondary pulmonary arterial hypertension: Secondary | ICD-10-CM | POA: Diagnosis not present

## 2023-09-17 DIAGNOSIS — Z23 Encounter for immunization: Secondary | ICD-10-CM

## 2023-09-17 DIAGNOSIS — F5104 Psychophysiologic insomnia: Secondary | ICD-10-CM | POA: Diagnosis not present

## 2023-09-17 MED ORDER — ZOLPIDEM TARTRATE 5 MG PO TABS
5.0000 mg | ORAL_TABLET | Freq: Every evening | ORAL | 5 refills | Status: DC | PRN
Start: 2023-10-08 — End: 2024-03-18

## 2023-09-17 NOTE — Progress Notes (Signed)
Subjective: CC: Insomnia PCP: [name], DO Kelly Donaldson is a 67 y.o. female presenting to clinic today for:  1.  Insomnia/ PAH Patient reports regular use of Ambien at bedtime.  No reports of excessive daytime sedation, falls, respiratory changes.  She has been on home 2 L pretty regularly.  She denies any sleepwalking.  She has noticed some memory changes over the last year and a half and reports that she has a relative that has Alzheimer's dementia in a nursing home right now.  She would like at some point to be checked for dementia but does not want to do this today.   ROS: Per HPI  Allergies  Allergen Reactions   Gabapentin Other (See Comments)    Pt states that she can not take with antidepressants   Latex Rash   Past Medical History:  Diagnosis Date   Allergy    Depression    GERD (gastroesophageal reflux disease)    Hyperlipidemia    Oxygen dependent    Shortness of breath dyspnea    Varicose veins     Current Outpatient Medications:    Accu-Chek Softclix Lancets lancets, Check glucose BID Dx R73.03, Disp: 200 each, Rfl: 3   albuterol (VENTOLIN HFA) 108 (90 Base) MCG/ACT inhaler, INHALE 2 PUFFS EVERY 4 HOURS AS NEEDED FOR WHEEZING OR SHORTNESS OF BREATH, Disp: 18 g, Rfl: 0   Alcohol Swabs (B-D SINGLE USE SWABS REGULAR) PADS, Check glucose BID Dx R73.03, Disp: 200 each, Rfl: 3   aspirin EC 81 MG tablet, Take 1 tablet (81 mg total) by mouth daily., Disp: 90 tablet, Rfl: 3   azaTHIOprine (IMURAN) 50 MG tablet, Take 50 mg by mouth in the morning and at bedtime., Disp: , Rfl:    Blood Glucose Monitoring Suppl (ACCU-CHEK AVIVA PLUS) w/Device KIT, Check glucose BID Dx R73.03, Disp: 1 kit, Rfl: 0   BREZTRI AEROSPHERE 160-9-4.8 MCG/ACT AERO, INHALE 2 PUFFS INTO THE LUNGS IN THE MORNING AND AT BEDTIME., Disp: 3 each, Rfl: 3   citalopram (CELEXA) 20 MG tablet, Take 1 tablet (20 mg total) by mouth daily., Disp: 90 tablet, Rfl: 3   dicyclomine (BENTYL) 20 MG  tablet, Take 20 mg by mouth daily., Disp: , Rfl:    ferrous sulfate (FERROUSUL) 325 (65 FE) MG tablet, Take 1 tablet (325 mg total) by mouth daily with breakfast. (Patient taking differently: Take 325 mg by mouth 2 (two) times a week.), Disp: 90 tablet, Rfl: 3   fluticasone (FLONASE) 50 MCG/ACT nasal spray, Place 1 spray into both nostrils daily., Disp: 48 g, Rfl: 1   glucose blood (ACCU-CHEK AVIVA PLUS) test strip, Check glucose BID Dx R73.03, Disp: 200 each, Rfl: 3   HYDROcodone-acetaminophen (NORCO) 10-325 MG tablet, Take 0.5-1 tablets by mouth up to 4- 5 times daily only as needed for chronic multifactorial pain. (Patient taking differently: Take 0.5-1 tablets by mouth 4 (four) times daily as needed for moderate pain.), Disp: 150 tablet, Rfl: 0   ipratropium-albuterol (DUONEB) 0.5-2.5 (3) MG/3ML SOLN, INHALE THE CONTENTS OF 1 VIAL VIA NEBULIZER FOUR TIMES DAILY AS NEEDED, Disp: 360 mL, Rfl: 1   Lancets Misc. (ACCU-CHEK SOFTCLIX LANCET DEV) KIT, Check glucose BID Dx R73.03, Disp: 1 kit, Rfl: 0   loratadine (CLARITIN) 10 MG tablet, Take 1 tablet (10 mg total) by mouth daily. (Patient taking differently: Take 10 mg by mouth daily as needed for allergies.), Disp: 90 tablet, Rfl: 3   macitentan (OPSUMIT) 10 MG tablet, TAKE 1 TABLET (  10MG ) BY MOUTH DAILY, Disp: 30 tablet, Rfl: 11   montelukast (SINGULAIR) 10 MG tablet, Take 1 tablet (10 mg total) by mouth at bedtime., Disp: 30 tablet, Rfl: 11   OXYGEN, Take 2.5-4 L by mouth continuous. 2.5 L with resting state & 4 L with exertion [facility], Disp: , Rfl:    Polyethyl Glycol-Propyl Glycol 0.4-0.3 % SOLN, Place 1 drop into both eyes daily., Disp: , Rfl:    potassium chloride SA (KLOR-CON M) 20 MEQ tablet, TAKE 2 TABLETS EVERY MORNING AND TAKE 1 TABLET EVERY EVENING (CHANGE IN DOSE), Disp: 270 tablet, Rfl: 3   pravastatin (PRAVACHOL) 20 MG tablet, Take 1 tablet (20 mg total) by mouth daily., Disp: 90 tablet, Rfl: 3   Riociguat (ADEMPAS) 2.5 MG TABS, Take  2.5 mg by mouth in the morning, at noon, and at bedtime., Disp: 200 tablet, Rfl: 4   Selexipag (UPTRAVI) 200 MCG TABS, Take 1 tablet (200 mcg total) by mouth 2 (two) times daily., Disp: 180 tablet, Rfl: 3   torsemide (DEMADEX) 20 MG tablet, Take 2 tablets (40 mg total) by mouth 2 (two) times daily., Disp: 360 tablet, Rfl: 3   Vitamin D, Ergocalciferol, (DRISDOL) 1.25 MG (50000 UNIT) CAPS capsule, Take 1 capsule (50,000 Units total) by mouth every 7 (seven) days., Disp: 12 capsule, Rfl: 3   [START ON 10/08/2023] zolpidem (AMBIEN) 5 MG tablet, Take 1 tablet (5 mg total) by mouth at bedtime as needed for sleep (please allow to fill every 30 days)., Disp: 30 tablet, Rfl: 5 Social History   Socioeconomic History   Marital status: Married    Spouse name: Jimmy   Number of children: 3   Years of education: Not on file   Highest education level: 12th grade  Occupational History   Occupation: Disabled  Tobacco Use   Smoking status: Former    Current packs/day: 0.00    Average packs/day: 0.3 packs/day for 17.0 years (4.3 ttl pk-yrs)    Types: Cigarettes    Start date: 03/01/1998    Quit date: 03/02/2015    Years since quitting: 8.5   Smokeless tobacco: Never  Vaping Use   Vaping status: Never Used  Substance and Sexual Activity   Alcohol use: No    Alcohol/week: 0.0 standard drinks of alcohol   Drug use: Yes    Types: Cocaine    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 3 children. Enjoys gardening and spending time with grandchildren.    Social Determinants of Health   Financial Resource Strain: Low Risk  (09/10/2023)   Overall Financial Resource Strain (CARDIA)    Difficulty of Paying Living Expenses: Not hard at all  Food Insecurity: No Food Insecurity (09/10/2023)   Hunger Vital Sign    Worried About Running Out of Food in the Last Year: Never true     Ran Out of Food in the Last Year: Never true  Transportation Needs: No Transportation Needs (09/10/2023)   PRAPARE - Administrator, Civil Service (Medical): No    Lack of Transportation (Non-Medical): No  Physical Activity: Inactive (09/10/2023)   Exercise Vital Sign    Days of Exercise per Week: 0 days    Minutes of Exercise per Session: 0 min  Stress: No Stress Concern Present (09/10/2023)   [employer] of Occupational Health - Occupational Stress Questionnaire  Feeling of Stress : Not at all  Social Connections: Moderately Integrated (09/10/2023)   Social Connection and Isolation Panel [NHANES]    Frequency of Communication with Friends and Family: More than three times a week    Frequency of Social Gatherings with Friends and Family: More than three times a week    Attends Religious Services: More than 4 times per year    Active Member of [employer] or Organizations: No    Attends Banker Meetings: Never    Marital Status: Married  Catering manager Violence: Not At Risk (09/10/2023)   Humiliation, Afraid, Rape, and Kick questionnaire    Fear of Current or Ex-Partner: No    Emotionally Abused: No    Physically Abused: No    Sexually Abused: No   Family History  Problem Relation Age of Onset   Hyperlipidemia Mother    Hypertension Mother    Diabetes Mother    Other Mother        varicose veins   Hyperlipidemia Father    Hypertension Father    Emphysema Father        smoked   Hypertension Daughter    Asthma Sister    Clotting disorder Daughter    Breast cancer Maternal Grandmother    Ovarian cancer Maternal Aunt     Objective: Office vital signs reviewed. BP (!) 88/56   Pulse 86   Temp 98.3 F (36.8 C)   Ht 5\' 9"  (1.753 m)   Wt 199 lb 6.4 oz (90.4 kg)   SpO2 (!) 88% Comment: 3L o2  BMI 29.45 kg/m   Physical Examination:  General: Awake, alert, chronically ill appearing female, No acute distress HEENT: sclera white, MMM, exotropia  present Cardio: regular rate and rhythm, S1S2 heard, soft LSB murmurs appreciated Pulm: Globally decreased breath sounds with normal work of breathing on 3 L via nasal cannula     09/17/2023   10:41 AM 09/10/2023    1:37 PM 03/20/2023   10:13 AM  Depression screen PHQ 2/9  Decreased Interest 0 0 1  Down, Depressed, Hopeless 0 0 1  PHQ - 2 Score 0 0 2  Altered sleeping 0  0  Tired, decreased energy 0  0  Change in appetite 0  0  Feeling bad or failure about yourself  0  0  Trouble concentrating 0  0  Moving slowly or fidgety/restless 0  0  Suicidal thoughts 0  0  PHQ-9 Score 0  2  Difficult doing work/chores   Not difficult at all      03/20/2023   10:13 AM 09/27/2022   10:15 AM 03/27/2022   11:22 AM 10/29/2021    1:28 PM  GAD 7 : Generalized Anxiety Score  Nervous, Anxious, on Edge 0 1 0 0  Control/stop worrying 0 0 1 0  Worry too much - different things 0 1 0 0  Trouble relaxing 0 0 0 0  Restless 0 0 0 0  Easily annoyed or irritable 1 2 1 1   Afraid - awful might happen 0 0 1 1  Total GAD 7 Score 1 4 3 2   Anxiety Difficulty Not difficult at all Not difficult at all Somewhat difficult Somewhat difficult   Assessment/ Plan: 67 y.o. female   Psychophysiological insomnia - Plan: zolpidem (AMBIEN) 5 MG tablet  PAH (pulmonary artery hypertension) (HCC)  Memory change  Insomnia is chronic and stable.  Reports some memory change over the last year.  Family history is significant for  Alzheimer's dementia.  I offered MMSE today but she wanted to hold off until her physical in April.  We discussed consideration for initiation of medication if this is found to be even mildly positive and she would be amenable to this  Initially oxygen was in the 70s.  She notes that this is not uncommon for her oxygen to be low when she is ambulating with her compressor.  She saturates in normal range on the tank at home.  After removing her mask and taking deep breaths O2 did come up to  88%   Melanye Hiraldo Hulen Skains, DO [facility] [phone]

## 2023-09-18 ENCOUNTER — Other Ambulatory Visit (HOSPITAL_COMMUNITY): Payer: Self-pay

## 2023-09-18 DIAGNOSIS — I5032 Chronic diastolic (congestive) heart failure: Secondary | ICD-10-CM

## 2023-09-18 MED ORDER — TORSEMIDE 20 MG PO TABS
40.0000 mg | ORAL_TABLET | Freq: Two times a day (BID) | ORAL | 0 refills | Status: DC
Start: 2023-09-18 — End: 2023-09-18

## 2023-09-18 MED ORDER — TORSEMIDE 20 MG PO TABS
ORAL_TABLET | ORAL | 0 refills | Status: DC
Start: 2023-09-18 — End: 2023-11-13

## 2023-09-30 ENCOUNTER — Other Ambulatory Visit: Payer: Self-pay | Admitting: Family Medicine

## 2023-09-30 DIAGNOSIS — B9689 Other specified bacterial agents as the cause of diseases classified elsewhere: Secondary | ICD-10-CM

## 2023-09-30 DIAGNOSIS — J3089 Other allergic rhinitis: Secondary | ICD-10-CM

## 2023-09-30 DIAGNOSIS — J9611 Chronic respiratory failure with hypoxia: Secondary | ICD-10-CM

## 2023-10-20 ENCOUNTER — Ambulatory Visit (HOSPITAL_COMMUNITY)
Admission: RE | Admit: 2023-10-20 | Discharge: 2023-10-20 | Disposition: A | Discharge status: Home / Self Care | Payer: Medicare PPO | Source: Ambulatory Visit | Attending: Cardiology | Admitting: Cardiology

## 2023-10-20 ENCOUNTER — Encounter (HOSPITAL_COMMUNITY): Payer: Self-pay | Admitting: Cardiology

## 2023-10-20 VITALS — BP 90/50 | HR 67 | Wt 200.2 lb

## 2023-10-20 DIAGNOSIS — R002 Palpitations: Secondary | ICD-10-CM | POA: Diagnosis not present

## 2023-10-20 DIAGNOSIS — Z79624 Long term (current) use of inhibitors of nucleotide synthesis: Secondary | ICD-10-CM | POA: Diagnosis not present

## 2023-10-20 DIAGNOSIS — J8489 Other specified interstitial pulmonary diseases: Secondary | ICD-10-CM | POA: Diagnosis not present

## 2023-10-20 DIAGNOSIS — M069 Rheumatoid arthritis, unspecified: Secondary | ICD-10-CM | POA: Insufficient documentation

## 2023-10-20 DIAGNOSIS — J449 Chronic obstructive pulmonary disease, unspecified: Secondary | ICD-10-CM | POA: Diagnosis not present

## 2023-10-20 DIAGNOSIS — Z79631 Long term (current) use of antimetabolite agent: Secondary | ICD-10-CM | POA: Insufficient documentation

## 2023-10-20 DIAGNOSIS — I2721 Secondary pulmonary arterial hypertension: Secondary | ICD-10-CM | POA: Diagnosis not present

## 2023-10-20 DIAGNOSIS — Z87891 Personal history of nicotine dependence: Secondary | ICD-10-CM | POA: Insufficient documentation

## 2023-10-20 DIAGNOSIS — I5032 Chronic diastolic (congestive) heart failure: Secondary | ICD-10-CM | POA: Insufficient documentation

## 2023-10-20 DIAGNOSIS — Z79899 Other long term (current) drug therapy: Secondary | ICD-10-CM | POA: Insufficient documentation

## 2023-10-20 DIAGNOSIS — G8929 Other chronic pain: Secondary | ICD-10-CM | POA: Diagnosis not present

## 2023-10-20 DIAGNOSIS — Z9981 Dependence on supplemental oxygen: Secondary | ICD-10-CM | POA: Diagnosis not present

## 2023-10-20 DIAGNOSIS — M25562 Pain in left knee: Secondary | ICD-10-CM | POA: Insufficient documentation

## 2023-10-20 DIAGNOSIS — I2729 Other secondary pulmonary hypertension: Secondary | ICD-10-CM | POA: Diagnosis not present

## 2023-10-20 DIAGNOSIS — D869 Sarcoidosis, unspecified: Secondary | ICD-10-CM

## 2023-10-20 DIAGNOSIS — E785 Hyperlipidemia, unspecified: Secondary | ICD-10-CM | POA: Diagnosis not present

## 2023-10-20 DIAGNOSIS — J9611 Chronic respiratory failure with hypoxia: Secondary | ICD-10-CM | POA: Insufficient documentation

## 2023-10-20 LAB — BASIC METABOLIC PANEL
Anion gap: 7 (ref 5–15)
BUN: 11 mg/dL (ref 8–23)
CO2: 28 mmol/L (ref 22–32)
Calcium: 9 mg/dL (ref 8.9–10.3)
Chloride: 102 mmol/L (ref 98–111)
Creatinine, Ser: 0.94 mg/dL (ref 0.44–1.00)
GFR, Estimated: >60 mL/min (ref >60)
Glucose, Bld: 96 mg/dL (ref 70–99)
Potassium: 3.6 mmol/L (ref 3.5–5.1)
Sodium: 137 mmol/L (ref 135–145)

## 2023-10-20 LAB — BRAIN NATRIURETIC PEPTIDE: B Natriuretic Peptide: 33.6 pg/mL (ref 0.0–100.0)

## 2023-10-20 NOTE — Patient Instructions (Addendum)
   No medication changes were made  Labs done today, your results will be available in MyChart, we will contact you for abnormal readings.  Your physician recommends that you schedule a follow-up appointment in: 3 months(February 2025) ** PLEASE CALL THE OFFICE IN DECEMBER TO ARRANGE YOUR FOLLOW UP APPOINTMENT. **  If you have any questions or concerns before your next appointment please send [place] a message through [place] or call our office at [phone].    TO LEAVE A MESSAGE FOR THE NURSE SELECT OPTION 2, PLEASE LEAVE A MESSAGE INCLUDING: YOUR NAME DATE OF BIRTH CALL BACK NUMBER REASON FOR CALL**this is important as we prioritize the call backs  YOU WILL RECEIVE A CALL BACK THE SAME DAY AS LONG AS YOU CALL BEFORE 4:00 PM  At the [facility], you and your health needs are our priority. As part of our continuing mission to provide you with exceptional heart care, we have created designated Provider Care Teams. These Care Teams include your primary Cardiologist (physician) and Advanced Practice Providers (APPs- Physician Assistants and Nurse Practitioners) who all work together to provide you with the care you need, when you need it.   You may see any of the following providers on your designated Care Team at your next follow up: Dr Arvilla Meres Dr [name] Dr. Dorthula Nettles Dr. Clearnce Hasten Amy Filbert Schilder, NP Robbie Lis, [place] [facility] [place], [place] Brynda Peon, NP [place] Lee, NP Karle Plumber, PharmD   Please be sure to bring in all your medications bottles to every appointment.    Thank you for choosing [facility]

## 2023-10-20 NOTE — Progress Notes (Signed)
Date:  10/20/2023   ID:  Kelly Donaldson, DOB 1956-02-01, MRN [id number]  Provider location: [facility] Type of Visit: Established patient   PCP:  Kelly Ip, DO  Cardiologist:  Kelly Donaldson   History of Present Illness: Kelly Donaldson is a 67 y.o. female who has a history of chronic hypoxemic respiratory failure and pulmonary hypertension.  She has been followed by Kelly Donaldson, now follows with Kelly Donaldson with pulmonology.  Has been on home oxygen for several years now.  She quit smoking in 2016.  She had an echo done in 5/16 showing dilated RV with severe pulmonary hypertension.  She therefore had RHC in 5/16 which confirmed severe pulmonary hypertension and RV failure.  PFTs showed only mild obstruction and restriction with markedly decreased DLCO suggestive of pulmonary vascular disease.  There was concern for sarcoidosis on CT chest but lymph node biopsy showed no evidence for sarcoidosis.  V/Q scan showed no evidence for chronic PE.    She has not tolerated Revatio or Adcirca.  Have failed previous up-titration of Selexipag with pelvic pain, can tolerate 200 mcg bid.  She is now tolerating riociguat 2 mg tid.    Started torsemide in Oct. 2017, has had better diuresis.    Due to ongoing dyspnea, she had repeat RHC in 5/19 that showed normal filling pressures and well-controlled pulmonary hypertension.  Echo in 6/19 showed normal LV EF, normal RV systolic function, and mild RV dilation.    She now is on Anoro, feels like this has helped her breathing.    Echo in 9/20 showed EF 55-60%, mild RV dilation with normal systolic function, PASP 26 mmHg, IVC normal.   She has been diagnosed with rheumatoid arthritis, which may explain her ILD.   Echo in 11/21 showed EF 55-60% with normal RV, PASP 30, IVC normal.   Echo in 4/24 showed EF 65-70%, mildly D-shaped septum, mild RV dilation with normal RV systolic function, PASP 51 mmHg, normal IVC.  RHC was done in  4/24, showing mild pulmonary hypertension.   She returns for followup of pulmonary HTN.  She is on 2L home oxygen chronically.  Weight is down 15 lbs.  Breathing is "pretty good."  She is not walking much due to chronic knee pain, very bad on left.  She has occasional mild headaches that she attributes to Adempas.  No orthopnea/PND. No chest pain.  No lightheadedness.  Rare palpitations.   ECG (personally reviewed): NSR, normal   6 minute walk (5/16): 238 meters => decreased oxygen saturation to 73%, increased to 6 L [place] 6 minute walk (7/16): 170 meters => unable to complete.  6 minute walk (9/16): 201 meters 6 minute walk (1/17): 256 meters 6 minute walk (4/17): 219 meters 6 minute walk (8/17): 225 meters 6 minute walk (11/17): 244 meters 6 minute walk (5/18): 229 meters 6 minute walk (2/19): 122 meters (but she was stopped halfway through with drop in oxygen saturation).  6 minute walk (12/20): 304 meters 6 minute walk (8/21): 476 m 6 minute walk (3/22): 213 meters   Labs (2/16): BNP 69 Labs (5/16): LFTs normal, HCT 34.9, RF negative, HIV negative, TSH negative Labs (6/16): K 4.4, creatinine 0.88 Labs (7/16): ANA negative, BNP 58, K 4.3, creatinine 1.1, anti-RNP negative Labs (9/16): K 4.4, creatinine 1.26, BNP 91 Labs (10/16): K 4.3, creatinine 1.04 Labs (12/16): K 4.2, creatinine 1.07, BNP 130 Labs (1/17): K 4.3, creatinine 1.23, BNP 42 Labs (  3/17): K 4.2, creatinine 1.19 Labs (5/17): K 4.3, creatinine 1.35, BNP 44 Labs (6/17): K 4.4, creatinine 1.18, BNP 44 Labs (10/17): K 4.2, creatinine 0.98, BNP 43 Labs (12/17): K 4.5, creatinine 1.23. BNP 64.5 Labs (2/18): K 4.5, creatinine 1.21, BNP 64 Labs (4/18): K 4.1, creatinine 1.24, BNP 26 Labs (9/18): K 4.6, creatinine 1.25 Labs (2/19): K 4.2, creatinine 1.39 Labs (5/19): K 4, creatinine 1.4, hgb 8.4 Labs (12/19): K 4.3, creatinine 1.15 Labs (6/20): TSH normal, K 4, creatinine 1.04, LDL 78 Labs (12/20): LDL 99, K 4.3, creatinine  1.0 Labs (4/21): BNP 36, K 4.1, creatinine 0.96 Labs (8/21): BNP 48, K 4.1, creatinine 0.88 Labs (2/22): hgb 12.2, BNP 22, K 4.1, creatinine 1.03, LDL 129 Labs (4/22): K 4.3, creatinine 1.13 Labs (11/23): K 3.9, creatinine 1.23 Labs (2/24): BNP 49 Labs (3/24): K 3.7, creatinine 1.02 Labs (5/24): K 3.9, creatinine 1.0 . PMH: 1. Pulmonary hypertension: RHC (5/16) with mean RA 18, PA 83/33 mean 53, mean PCWP 19, CI 2.61, PVR 5.8 WU.   Echo (5/16) with EF 65-70%, septal flattening, dilated RV with PA systolic pressure 99 mmHg, +bubble study.  PFTs (4/16) with FVC 76%, FEV1 75%, ratio 97%, TLC 66%, DLCO 26% => mild restriction, mild obstruction, severe diffuse abnormality (pulmonary vascular problem).  CTA chest (4/16) with chronic interstitial and obstructive lung disease, small mediastinal and hilar lymph noes, no PE.  Lymph node biopsy negative for sarcoidosis (reactive changes).  V/Q scan (6/16): No acute or chronic PE. TEE (6/16): Normal LV size and systolic function, EF 55-60%, RV was mildly dilated with mildly decreased systolic function, D-shaped interventricular septum suggestive of RV pressure/volume overload, there appeared to be a small PFO present with some bubbles crossing, no ASD.  ANA negative, anti-RNP negative, HIV negative. Sleep study (8/16) without OSA.  She did not tolerate Adcirca or Revatio.  She cannot tolerate more than 200 mcg bid Selexipag.  - Echo (6/17): EF 60-65%, normal diastolic function, D-shaped interventricular septum, mildly dilated RV with normal RV systolic function, PASP 39, IVC normal.  - Echo (6/18): EF 60-65%, normal diastolic function, D-shaped interventricular septum, mildly dilated RV with normal RV systolic function, PASP 65 mmHg.  - RHC (7/18): RA 13, PA 51/23 mean 36, PCWP 19, CI 4.74, PVR 1.6 WU - RHC (5/19): mean RA 8, PA 48/20 mean 30, mean PCWP 13, CI 5.06, PVR 1.58 WU.  - Echo (6/19): EF 60-65%, mild RV dilation with normal RV systolic function, PASP  60, possible PFO.  - Echo (9/20): EF 55-60%, mild RV dilation with normal systolic function, PASP 26 mmHg, IVC normal.  - Echo (11/21): EF 55-60%, normal RV, PASP 30 mmHg, IVC normal.  - Echo (4/24): EF 65-70%, mildly D-shaped septum, mild RV dilation with normal RV systolic function, PASP 51 mmHg, normal IVC.  - RHC (4/24): mean RA 14, PA 39/17 mean 26, mean PCWP 10, PAPi 1.57, CI 3.1, PVR 2.5 WU.  2. Chronic venous insufficiency. 3. Hyperlipidemia.  4. GERD 5. Chronic hypoxemic respiratory failure: Hypoxemia, on home oxygen.  Seen by Kelly [name], has emphysema + interstitial lung disease (?UIP => suspect related to RA).  - PFTs (7/19): FEV1 82%, FVC 80%, ratio 101%, TLC 73%, DLCO 23%.  6. Fe deficiency anemia: EGD/colonoscopy [facility] normal in 2020.  7. Rheumatoid arthritis: Azathioprine.  8. PVCs: Zio monitor in 2/24 showed 2.2% PVCs.  Current Outpatient Medications  Medication Sig Dispense Refill   Accu-Chek Softclix Lancets lancets Check glucose BID Dx R73.03  200 each 3   albuterol (VENTOLIN HFA) 108 (90 Base) MCG/ACT inhaler INHALE 2 PUFFS EVERY 4 HOURS AS NEEDED FOR WHEEZING OR SHORTNESS OF BREATH 18 g 0   Alcohol Swabs (B-D SINGLE USE SWABS REGULAR) PADS Check glucose BID Dx R73.03 200 each 3   aspirin EC 81 MG tablet Take 1 tablet (81 mg total) by mouth daily. 90 tablet 3   azaTHIOprine (IMURAN) 50 MG tablet Take 50 mg by mouth in the morning and at bedtime.     Blood Glucose Monitoring Suppl (ACCU-CHEK AVIVA PLUS) w/Device KIT Check glucose BID Dx R73.03 1 kit 0   BREZTRI AEROSPHERE 160-9-4.8 MCG/ACT AERO INHALE 2 PUFFS INTO THE LUNGS IN THE MORNING AND AT BEDTIME. 3 each 3   citalopram (CELEXA) 20 MG tablet Take 1 tablet (20 mg total) by mouth daily. 90 tablet 3   dicyclomine (BENTYL) 20 MG tablet Take 20 mg by mouth daily.     ferrous sulfate (FERROUSUL) 325 (65 FE) MG tablet Take 1 tablet (325 mg total) by mouth daily with breakfast. 90 tablet 3   fluticasone (FLONASE) 50  MCG/ACT nasal spray SPRAY 1 SPRAY INTO BOTH NOSTRILS DAILY. 48 mL 3   glucose blood (ACCU-CHEK AVIVA PLUS) test strip Check glucose BID Dx R73.03 200 each 3   HYDROcodone-acetaminophen (NORCO) 10-325 MG tablet Take 0.5-1 tablets by mouth up to 4- 5 times daily only as needed for chronic multifactorial pain. 150 tablet 0   ipratropium-albuterol (DUONEB) 0.5-2.5 (3) MG/3ML SOLN INHALE THE CONTENTS OF 1 VIAL VIA NEBULIZER FOUR TIMES DAILY AS NEEDED 360 mL 1   Lancets Misc. (ACCU-CHEK SOFTCLIX LANCET DEV) KIT Check glucose BID Dx R73.03 1 kit 0   loratadine (CLARITIN) 10 MG tablet Take 1 tablet (10 mg total) by mouth daily. 90 tablet 3   macitentan (OPSUMIT) 10 MG tablet TAKE 1 TABLET (10MG ) BY MOUTH DAILY 30 tablet 11   montelukast (SINGULAIR) 10 MG tablet Take 1 tablet (10 mg total) by mouth at bedtime. 30 tablet 11   OXYGEN Take 2.5-4 L by mouth continuous. 2.5 L with resting state & 4 L with exertion [employer]     Polyethyl Glycol-Propyl Glycol 0.4-0.3 % SOLN Place 1 drop into both eyes daily.     potassium chloride SA (KLOR-CON M) 20 MEQ tablet Take 20 mEq by mouth 2 (two) times daily.     pravastatin (PRAVACHOL) 20 MG tablet Take 1 tablet (20 mg total) by mouth daily. 90 tablet 3   Riociguat (ADEMPAS) 2.5 MG TABS Take 2.5 mg by mouth in the morning, at noon, and at bedtime. 200 tablet 4   Selexipag (UPTRAVI) 200 MCG TABS Take 1 tablet (200 mcg total) by mouth 2 (two) times daily. 180 tablet 3   torsemide (DEMADEX) 20 MG tablet Take 3 tablets by mouth in the morning and 2 tablets every evening. 150 tablet 0   Vitamin D, Ergocalciferol, (DRISDOL) 1.25 MG (50000 UNIT) CAPS capsule Take 1 capsule (50,000 Units total) by mouth every 7 (seven) days. 12 capsule 3   zolpidem (AMBIEN) 5 MG tablet Take 1 tablet (5 mg total) by mouth at bedtime as needed for sleep (please allow to fill every 30 days). 30 tablet 5   No current facility-administered medications for this encounter.    Allergies:    Gabapentin and Latex   Social History:  The patient  reports that she quit smoking about 8 years ago. Her smoking use included cigarettes. She started smoking about 25 years ago.  She has a 4.3 pack-year smoking history. She has never used smokeless tobacco. She reports current drug use. Drug: Cocaine. She reports that she does not drink alcohol.   Family History:  The patient's family history includes Asthma in her sister; Breast cancer in her maternal grandmother; Clotting disorder in her daughter; Diabetes in her mother; Emphysema in her father; Hyperlipidemia in her father and mother; Hypertension in her daughter, father, and mother; Other in her mother; Ovarian cancer in her maternal aunt.   ROS:  Please see the history of present illness.   All other systems are personally reviewed and negative.   Exam:   BP (!) 90/50   Pulse 67   Wt 90.8 kg (200 lb 3.2 oz)   SpO2 93% Comment: 4l n/c  BMI 29.56 kg/m  General: NAD Neck: No JVD, no thyromegaly or thyroid nodule.  Lungs: Distant BS CV: Nondisplaced PMI.  Heart regular S1/S2, no S3/S4, 2/6 SEM RUSB.  No peripheral edema.  No carotid bruit.  Normal pedal pulses.  Abdomen: Soft, nontender, no hepatosplenomegaly, no distention.  Skin: Intact without lesions or rashes.  Neurologic: Alert and oriented x 3.  Psych: Normal affect. Extremities: No clubbing or cyanosis.  HEENT: Normal.   Recent Labs: 03/27/2023: Hemoglobin 10.5; Platelets 192 10/20/2023: B Natriuretic Peptide 33.6; BUN 11; Creatinine, Ser 0.94; Potassium 3.6; Sodium 137  Personally reviewed   Wt Readings from Last 3 Encounters:  10/20/23 90.8 kg (200 lb 3.2 oz)  09/17/23 90.4 kg (199 lb 6.4 oz)  09/10/23 90.7 kg (200 lb)    ASSESSMENT AND PLAN:  1. Pulmonary arterial hypertension with RV failure: Severe on 5/16 RHC. 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  There was concern from CTA  chest for sarcoidosis, but lymph node biopsy was negative.  ILD is likely related to rheumatoid arthritis. LFTs normal, ANA negative, anti-RNP negative, HIV negative.  V/Q scan negative for chronic PE.  Small PFO but no ASD on TEE. No OSA on sleep study.  She did not tolerate Revatio due to joint pain (now resolved), and she did not tolerate Adcirca with worsening dyspnea. She does not tolerate more than 200 mcg bid Selexipag due to pelvic pain.   RHCs in 7/18 and 5/19 showed well-compensated pulmonary hypertension.  PVR was 1.58 WU on 5/19 study. Echo in 11/21 showed EF 55-60% with normal RV, PASP 30, normal IVC.  Echo in 4/24 showed EF 65-70%, mildly D-shaped septum, mild RV dilation with normal RV systolic function, PASP 51 mmHg, normal IVC.  RHC in 4/24 showed much improved PA pressure 39/17, PVR 2.5 WU.  She also has ILD and COPD that contribute to her dyspnea.  She is not volume overloaded on exam.  NYHA class II symptoms, more limited by knees.  - Continue oxygen.  - Open lung biopsy deemed too risky, probably not sarcoid per pulmonary evaluation => suspect ILD related to RA.    - Continue opsumit.  - Continue Adempas 2.5 mg tid.   - She has tolerated selexipag 200 mcg bid but has not been able to increase.  - Continue torsemide 60 qam/40 qpm and KCl to 40 qam/20 qpm.  BMET/BNP today.  - She does not feel that she can do a 6 minute walk today due to severe knee pain.  - With improved RHC and mild PH on 4/24 RHC, we elected not to start sotatercept.  2. Chronic hypoxemic  respiratory failure: Chronic interstitial lung disease + emphysema.  Concern for sarcoidosis but lymph node biopsy did not show sarcoid. Deemed too high risk for open lung biopsy.  Per pulmonary, she is in the fibrotic phase of NSIP.  She has been diagnosed with RA, this may be the source of her ILD.  - Continue pulmonary followup, sees Kelly. Judeth Horn.  3. Hyperlipidemia: Continue pravastatin.  4. Rheumatoid arthritis: On  azathioprine, follows with Kelly. Cena Benton.  5. Palpitations: Occasional PVCs on 2/24 Zio monitor.   Followup in 3 months.   Signed, [name], MD  10/20/2023  [facility] [address] [facility] [place] [address] [phone] (office) [phone] (fax)

## 2023-10-24 ENCOUNTER — Telehealth (INDEPENDENT_AMBULATORY_CARE_PROVIDER_SITE_OTHER): Payer: Medicare PPO | Admitting: Pulmonary Disease

## 2023-10-24 ENCOUNTER — Encounter: Payer: Self-pay | Admitting: Pulmonary Disease

## 2023-10-24 DIAGNOSIS — J9611 Chronic respiratory failure with hypoxia: Secondary | ICD-10-CM | POA: Diagnosis not present

## 2023-10-24 DIAGNOSIS — J849 Interstitial pulmonary disease, unspecified: Secondary | ICD-10-CM | POA: Diagnosis not present

## 2023-10-24 DIAGNOSIS — I2721 Secondary pulmonary arterial hypertension: Secondary | ICD-10-CM

## 2023-10-24 MED ORDER — AZATHIOPRINE 50 MG PO TABS: 50.0000 mg | ORAL_TABLET | Freq: Two times a day (BID) | ORAL | 11 refills | Status: DC

## 2023-10-24 NOTE — Progress Notes (Signed)
@Patient  ID: Kelly Donaldson, female    DOB: 02-05-1956, 67 y.o.   MRN: [id number]  No chief complaint on file. Chief complaint: Shortness of breath  Referring provider: [name], DO  HPI:   67 y.o. 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  Formally patient of Dr. [name] and more recently Dr. [name].  Most recent cardiology note reviewed.   Overall, doing okay.  Breathing stable.  She reports good adherence to triple oral pulmonary vasodilators, limited in Uptravi dose to 200 mcg twice daily.  Dyspnea stable.  Oxygen slowly worsening over time from 2 to 2.5 L.  Otherwise doing okay.  Asked questions about RSV vaccine.  Asked about azathioprine.  About to run out.  Does not miss doses.  Discussed refilling today.  Questionaires / Pulmonary Flowsheets:   ACT:      No data to display          MMRC: mMRC Dyspnea Scale mMRC Score  04/13/2021 10:39 AM 2    Epworth:      No data to display          Tests:   FENO:  No results found for: "NITRICOXIDE"  PFT:    Latest Ref Rng & Units 06/22/2021   12:01 PM 06/18/2018    9:08 AM 03/20/2015    9:09 AM  PFT Results  FVC-Pre L 2.17  2.37  2.33   FVC-Predicted Pre % 75  80  76   FVC-Post L 2.28   2.32   FVC-Predicted Post % 79   76   Pre FEV1/FVC % % 79  80  78   Post FEV1/FCV % % 79   77   FEV1-Pre L 1.72  1.90  1.81   FEV1-Predicted Pre % 76  82  75   FEV1-Post L 1.79   1.79   DLCO uncorrected ml/min/mmHg 6.88  6.52  7.39   DLCO UNC% % 31  23  26    DLCO corrected ml/min/mmHg  8.12    DLCO COR %Predicted %  28    DLVA Predicted % 40  43  35   TLC L 3.86  4.07  3.63   TLC % Predicted % 70  73  66   RV % Predicted % 68  81  38   Personally reviewed and interpreted as mild restriction with severely reduced DLCO consistent with concomitant ILD and pulmonary hypertension. Overall values stable since 2016.  WALK:     07/11/2020   10:48 AM 11/22/2019     8:58 AM 06/18/2018   10:50 AM 11/26/2016   10:48 AM 05/09/2015    9:39 AM 03/20/2015   12:14 PM 01/11/2015    4:00 PM  SIX MIN WALK  Medications aspirin 81mg , hydrocodone-acetaminophen 10-325mg , opsumit 10mg , potassium chloride 20mg , adempas 2mg , selexipag , torsemide 20mg , taken at 7:30am   ASA 81mg , Celexa 40mg , Vit D 50000iu, Norco 10-325mg , Macitenan 10mg , klor Con , Riociguat 1.5mg , Selexipag , Demedex 20mg  @ 0600     Supplimental Oxygen during Test? (L/min) Yes Yes Yes Yes Yes Yes Yes  O2 Flow Rate 3 L/min 2 L/min 6 L/min 4 L/min 4 L/min 2 L/min 2 L/min  Type Continuous Continuous Continuous Pulse Pulse Continuous Continuous  Laps 14   5     Partial Lap (in Meters) 0 meters   15 meters     Baseline BP (sitting) 112/70   104/62     Baseline  Heartrate 68 73  73     Baseline Dyspnea (Borg Scale) 0   0.5     Baseline Fatigue (Borg Scale) 0   0     Baseline SPO2 96 % 94 %  100 %     BP (sitting) 120/80   124/58     Heartrate 85 100  92     Dyspnea (Borg Scale) 2   4     Fatigue (Borg Scale) 2   4     SPO2 84 % 54 %  87 %     BP (sitting) 114/76   114/64     Heartrate 72 72  86     SPO2 97 % 94 %  93 %     Stopped or Paused before Six Minutes Yes Yes  Yes     Other Symptoms at end of Exercise Pt stopped with 50 sec remaining due to just "being done." Pt also stated that she felt like her knee was popping out of joint. tired  pt stopped at 3:33secs d/t fatigue and dyspnea, resumed 3:23secs.  stopped at 2:52sec d/t fatigue and dyspnea (Dyspnea Borg = 10  sat = 86% 4lpm pulsed), increased O2 to 5lpm pulsed, sats recovered to 95%, resumed test at 1:02secs     Interpretation Leg pain        Distance Completed 476 meters   255 meters     Distance Completed 0 meters        Tech Comments: Pt walked at a normal pace, denied any complaints during walk. stopped with 50 seconds remaining due to being done with the walk. 304.71meters pt started walking on 3L but needed 6L to maintain at  91%, we stopped when O2 reached 75% TA/CMA  after sat dropped to 88%4lpm pulsed--increased o2 to 5lpm pulsed and pt walked another lap with sats ending at 79%5lpm pulsed/normal pace/no SOB//lmr After lap 1 sats= 87%2lpm--increased o2 to 4 lpm and walked another lap and sats = 89%4lpm cont/pt c/o minimal SOB/walked normal pace//lmr normal pace/stopped due to increased SOB//lmr    Imaging: Personally reviewed CT high-resolution chest most recent 06/2018 demonstrates upper lobe predominant emphysema as well as what appears to be NSIP pattern ILD with traction bronchiectasis in bilateral lower lobes on my interpretation  Lab Results: Personally reviewed, notably eosinophils as high as 700 CBC    Component Value Date/Time   WBC 5.1 03/27/2023 0642   RBC 3.89 03/27/2023 0642   HGB 10.5 (L) 03/27/2023 0804   HGB 11.7 10/29/2021 1354   HCT 31.0 (L) 03/27/2023 0804   HCT 37.2 10/29/2021 1354   PLT 192 03/27/2023 0642   PLT 241 10/29/2021 1354   MCV 88.2 03/27/2023 0642   MCV 85 10/29/2021 1354   MCH 27.0 03/27/2023 0642   MCHC 30.6 03/27/2023 0642   RDW 15.1 03/27/2023 0642   RDW 13.8 10/29/2021 1354   LYMPHSABS 1.2 01/28/2023 1554   LYMPHSABS 2.0 11/11/2019 0939   MONOABS 0.5 01/28/2023 1554   EOSABS 0.4 01/28/2023 1554   EOSABS 0.7 (H) 11/11/2019 0939   BASOSABS 0.1 01/28/2023 1554   BASOSABS 0.1 11/11/2019 0939    BMET    Component Value Date/Time   NA 137 10/20/2023 1022   NA 140 09/27/2022 1045   K 3.6 10/20/2023 1022   CL 102 10/20/2023 1022   CO2 28 10/20/2023 1022   GLUCOSE 96 10/20/2023 1022   BUN 11 10/20/2023 1022   BUN 19 09/27/2022 1045   CREATININE 0.94  10/20/2023 1022   CALCIUM 9.0 10/20/2023 1022   GFRNONAA >60 10/20/2023 1022   GFRAA 66 01/22/2021 0947    BNP    Component Value Date/Time   BNP 33.6 10/20/2023 1022    ProBNP    Component Value Date/Time   PROBNP 69.0 01/11/2015 1630    Specialty Problems       Pulmonary Problems   Chronic  respiratory failure with hypoxia (HCC)    Followed in Pulmonary clinic/ [facility]/ [facility]  On 02 since 2013 (originally [facility]) - pulmonary eval [facility] last seen 12/17/12 with "neg CTa for PE, pfts ok except dlco 20%, echo c/w  PH, 02 dep hypoxemia > rec RHC and stop phenterimine" -Echo 09/15/14     LAE mildly dilated, as are RV and RA  - 01/11/2015   Walked 2lpm  x one lap @ 185 stopped due to  Sob, nl pace, no desat - Quit smoking 03/02/15  - PFTs 03/20/2015  Nl except for DLCO 26 corrects to 35%  - 03/20/2015   Walked 2lpm x one lap @ 185 stopped due to sob/ desat corrected on 4lpm  - 04/07/2015 rec  referral to Dr Jearld Pies for Right heart Cath - Repeat Echo with bubble study 04/19/2015 > With injection of agitated saline intravenously   there is appearance of bubbles in L sided chambers consistent   with 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 - 05/09/2015   Walked 4lpm x one lap @ 185 stopped due to  88% one lap > 05/11/15 Pos small PFO - 07/11/2020 desat during 6 mw on 3lpm cont   As of 07/11/2020  = 2lpm hs and rest/ up to 5lpm POC           ILD (interstitial lung disease) (HCC)    08/2016 high-resolution CT scan of the chest: [facility] review: Peripheral based interlobular septal thickening and groundglass consistent with fibrosis with some mild bronchiectasis, fibrotic changes appear to be more prominent in the lower lobes with upper lobe nodules, no air trapping noted on expiratory images, mediastinal lymphadenopathy stable per radiology  Chest HRCT 07/07/18 . CT pattern is considered indeterminate for usual interstitial pneumonia (UIP). 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 prior to study with DLCO  6.5 (23%) corrects to 2.23 (43%)  for alv volume and FV curve mild/mod curvature in effort indep portion of f/v > started on anoro and helped doe  CT chest 07/07/18 mild centrilobular and paraseptal emphysema. - 02/21/2020  After extensive coaching inhaler device,  effectiveness =    90% with smi > changed to stiolto per insurance req         Allergies  Allergen Reactions   Gabapentin Other (See Comments)    Pt states that she can not take with antidepressants   Latex Rash    Immunization History  Administered Date(s) Administered   Fluad Quad(high Dose 65+) 09/27/2022   Fluad Trivalent(High Dose 65+) 09/17/2023   Influenza Split 09/16/2014, 09/20/2015, 09/08/2017, 10/04/2017   Influenza,inj,Quad PF,6+ Mos 09/04/2016, 11/12/2018, 08/17/2019, 09/07/2020   Influenza-Unspecified 10/13/2017, 08/29/2021   Moderna  Sars-Covid-2 Vaccination 02/21/2020, 03/20/2020, 12/16/2020   PNEUMOCOCCAL CONJUGATE-20 03/27/2022   Pneumococcal Polysaccharide-23 11/11/2019   Tdap 03/20/2023   Zoster Recombinant(Shingrix) 09/20/2020, 04/17/2021    Past Medical History:  Diagnosis Date   Allergy    Depression    GERD (gastroesophageal reflux disease)    Hyperlipidemia    Oxygen dependent    Shortness of breath dyspnea    Varicose veins     Tobacco History: Social History   Tobacco Use  Smoking Status Former   Current packs/day: 0.00   Average packs/day: 0.3 packs/day for 17.0 years (4.3 ttl pk-yrs)   Types: Cigarettes   Start date: 03/01/1998   Quit date: 03/02/2015   Years since quitting: 8.6  Smokeless Tobacco Never   Counseling given: Not Answered   Continue to not smoke  Outpatient Encounter Medications as of 10/24/2023  Medication Sig   Accu-Chek Softclix Lancets lancets Check glucose BID Dx R73.03   albuterol (VENTOLIN HFA) 108 (90 Base) MCG/ACT inhaler INHALE 2 PUFFS EVERY 4 HOURS AS NEEDED FOR WHEEZING OR  SHORTNESS OF BREATH   Alcohol Swabs (B-D SINGLE USE SWABS REGULAR) PADS Check glucose BID Dx R73.03   aspirin EC 81 MG tablet Take 1 tablet (81 mg total) by mouth daily.   azaTHIOprine (IMURAN) 50 MG tablet Take 1 tablet (50 mg total) by mouth in the morning and at bedtime.   Blood Glucose Monitoring Suppl (ACCU-CHEK AVIVA PLUS) w/Device KIT Check glucose BID Dx R73.03   BREZTRI AEROSPHERE 160-9-4.8 MCG/ACT AERO INHALE 2 PUFFS INTO THE LUNGS IN THE MORNING AND AT BEDTIME.   citalopram (CELEXA) 20 MG tablet Take 1 tablet (20 mg total) by mouth daily.   dicyclomine (BENTYL) 20 MG tablet Take 20 mg by mouth daily.   ferrous sulfate (FERROUSUL) 325 (65 FE) MG tablet Take 1 tablet (325 mg total) by mouth daily with breakfast.   fluticasone (FLONASE) 50 MCG/ACT nasal spray SPRAY 1 SPRAY INTO BOTH NOSTRILS DAILY.   glucose blood (ACCU-CHEK AVIVA PLUS) test strip Check glucose BID Dx R73.03   HYDROcodone-acetaminophen (NORCO) 10-325 MG tablet Take 0.5-1 tablets by mouth up to 4- 5 times daily only as needed for chronic multifactorial pain.   ipratropium-albuterol (DUONEB) 0.5-2.5 (3) MG/3ML SOLN INHALE THE CONTENTS OF 1 VIAL VIA NEBULIZER FOUR TIMES DAILY AS NEEDED   Lancets Misc. (ACCU-CHEK SOFTCLIX LANCET DEV) KIT Check glucose BID Dx R73.03   loratadine (CLARITIN) 10 MG tablet Take 1 tablet (10 mg total) by mouth daily.   macitentan (OPSUMIT) 10 MG tablet TAKE 1 TABLET (10MG ) BY MOUTH DAILY   montelukast (SINGULAIR) 10 MG tablet Take 1 tablet (10 mg total) by mouth at bedtime.   OXYGEN Take 2.5-4 L by mouth continuous. 2.5 L with resting state & 4 L with exertion [employer]   Polyethyl Glycol-Propyl Glycol 0.4-0.3 % SOLN Place 1 drop into both eyes daily.   potassium chloride SA (KLOR-CON M) 20 MEQ tablet Take 20 mEq by mouth 2 (two) times daily.   pravastatin (PRAVACHOL) 20 MG tablet Take 1 tablet (20 mg total) by mouth daily.   Riociguat (ADEMPAS) 2.5 MG TABS Take 2.5 mg by mouth in the morning,  at noon, and at bedtime.   Selexipag (UPTRAVI) 200 MCG TABS Take 1 tablet (200 mcg total) by mouth 2 (two) times daily.   torsemide (DEMADEX) 20 MG tablet Take 3 tablets by mouth in the morning and 2 tablets every evening.   Vitamin D, Ergocalciferol, (DRISDOL) 1.25 MG (50000 UNIT) CAPS capsule Take  1 capsule (50,000 Units total) by mouth every 7 (seven) days.   zolpidem (AMBIEN) 5 MG tablet Take 1 tablet (5 mg total) by mouth at bedtime as needed for sleep (please allow to fill every 30 days).   [DISCONTINUED] azaTHIOprine (IMURAN) 50 MG tablet Take 50 mg by mouth in the morning and at bedtime.   No facility-administered encounter medications on file as of 10/24/2023.     Review of Systems  Review of Systems  N/a Physical Exam  There were no vitals taken for this visit.  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  On Imuran 50 mg BID for arthritis, and for her ILD.  Refilled today..  No worsening dyspnea.  Plan for repeat PFTs next summer.  Pulmonary hypertension: Likely multifactorial, group 1 given connective tissue disease, group 3 given ILD.  Continue medicines per Dr. Jearld Pies and cardiology (riociguat 2.5 TID, opsumit, uptravi 200 mcg BID).   Emphysema and asthma overlap: atopic symptoms, mild improvement in breathing with ICS/LABA.  Switch to Trelegy given  concerns for side effects.  Side effects no longer felt related to [facility], just TMJ.  Continue Breztri 2 puff twice daily.    Healthcare maintenance: Recommended RSV vaccine.   Return in about 6 months (around 04/22/2024) for f/u Dr. Judeth Horn.   Karren Burly, MD 10/24/2023  Virtual Visit via Video Note  I connected with Kelly Donaldson on 10/24/23 at  2:30 PM EST by a video enabled telemedicine application and verified that I am speaking with the correct person using two identifiers.  Location: Patient: Home Provider: Office - [facility] - [address] [place], [address], [place], [place] [address]  I discussed the limitations of evaluation and management by telemedicine and the availability of in person appointments. The patient expressed understanding and agreed to proceed. I also discussed with the patient that there may be a patient responsible charge related to this service. The patient expressed understanding and agreed to proceed.  Patient consented to consult via telephone: Yes People present and their role in pt care: Pt

## 2023-11-04 ENCOUNTER — Other Ambulatory Visit: Payer: Self-pay | Admitting: Family Medicine

## 2023-11-04 DIAGNOSIS — F331 Major depressive disorder, recurrent, moderate: Secondary | ICD-10-CM

## 2023-11-04 NOTE — Telephone Encounter (Signed)
Copied from CRM [id number]. Topic: Clinical - Medication Refill >> Nov 04, 2023  3:09 PM Fuller Mandril wrote: Most Recent Primary Care Visit:  Provider: [name]  Department: [facility] MED  Visit Type: OFFICE VISIT  Date: 09/17/2023  Medication: citalopram (CELEXA) 20 MG tablet  Has the patient contacted their pharmacy? No (Agent: If no, request that the patient contact the pharmacy for the refill. If patient does not wish to contact the pharmacy document the reason why and proceed with request.) (Agent: If yes, when and what did the pharmacy advise?)  Is this the correct pharmacy for this prescription? Yes If no, delete pharmacy and type the correct one.  This is the patient's preferred pharmacy:   [facility] [id number] - [place], [place] - [address] [place] [address] Phone: [phone] Fax: [phone]    Has the prescription been filled recently? No  Is the patient out of the medication? Yes  Has the patient been seen for an appointment in the last year OR does the patient have an upcoming appointment? Yes  Can we respond through MyChart? No  Agent: Please be advised that Rx refills may take up to 3 business days. We ask that you follow-up with your pharmacy.

## 2023-11-05 ENCOUNTER — Other Ambulatory Visit: Payer: Self-pay | Admitting: Family Medicine

## 2023-11-05 DIAGNOSIS — B9689 Other specified bacterial agents as the cause of diseases classified elsewhere: Secondary | ICD-10-CM

## 2023-11-05 DIAGNOSIS — J9611 Chronic respiratory failure with hypoxia: Secondary | ICD-10-CM

## 2023-11-13 ENCOUNTER — Telehealth (HOSPITAL_COMMUNITY): Payer: Self-pay | Admitting: Pharmacist

## 2023-11-13 ENCOUNTER — Other Ambulatory Visit: Payer: Self-pay | Admitting: Family Medicine

## 2023-11-13 ENCOUNTER — Other Ambulatory Visit (HOSPITAL_COMMUNITY): Payer: Self-pay

## 2023-11-13 ENCOUNTER — Other Ambulatory Visit (HOSPITAL_COMMUNITY): Payer: Self-pay | Admitting: Cardiology

## 2023-11-13 DIAGNOSIS — I5032 Chronic diastolic (congestive) heart failure: Secondary | ICD-10-CM

## 2023-11-13 DIAGNOSIS — F5104 Psychophysiologic insomnia: Secondary | ICD-10-CM

## 2023-11-13 NOTE — Telephone Encounter (Signed)
Advanced Heart Failure Patient Advocate Encounter  Prior Authorization for Kelly Donaldson has been approved.    PA# [id number] Effective dates: 12/09/22 through 12/08/24   Also attempted to submit Opsumit PA. Received the following determination: Authorization already on file for this request. Authorization starting on 09/09/2023 and ending on 12/08/2024.  Also attempted to submit PA for Adempas. Received the following determination. Our records show the member is enrolled in a Patient Assistance Program and cannot be covered under a Part D plan. Please be aware that this may prevent the prescription from being filled or reimbursement from being paid.*  Karle Plumber, PharmD, BCPS, BCCP, CPP Heart Failure Clinic Pharmacist [phone]

## 2023-11-13 NOTE — Telephone Encounter (Signed)
Patient Advocate Encounter   Received notification from [facility] that prior authorization for Kelly Donaldson is required.   PA submitted on CoverMyMeds Key [id number] Status is pending   Will continue to follow.   Karle Plumber, PharmD, BCPS, BCCP, CPP Heart Failure Clinic Pharmacist [phone]

## 2023-11-17 ENCOUNTER — Encounter: Payer: Self-pay | Admitting: *Deleted

## 2023-11-27 ENCOUNTER — Other Ambulatory Visit: Payer: Self-pay | Admitting: Family Medicine

## 2023-11-27 DIAGNOSIS — Z1231 Encounter for screening mammogram for malignant neoplasm of breast: Secondary | ICD-10-CM

## 2023-11-28 ENCOUNTER — Ambulatory Visit
Admission: RE | Admit: 2023-11-28 | Discharge: 2023-11-28 | Disposition: A | Discharge status: Home / Self Care | Payer: Medicare PPO | Source: Ambulatory Visit | Attending: Family Medicine | Admitting: Family Medicine

## 2023-11-28 DIAGNOSIS — Z1231 Encounter for screening mammogram for malignant neoplasm of breast: Secondary | ICD-10-CM

## 2023-12-09 ENCOUNTER — Telehealth: Payer: Self-pay | Admitting: Family Medicine

## 2023-12-09 NOTE — Telephone Encounter (Signed)
 Copied from CRM [id number]. Topic: Appointments - Appointment Scheduling >> Dec 09, 2023 11:42 AM Chase C wrote: Patient/patient representative is calling to schedule an appointment. Lanora Mitchell  from [facility] called to schedule 1 week appointment for the patient.

## 2023-12-22 ENCOUNTER — Ambulatory Visit (INDEPENDENT_AMBULATORY_CARE_PROVIDER_SITE_OTHER): Payer: Medicare PPO | Admitting: Family Medicine

## 2023-12-22 ENCOUNTER — Encounter: Payer: Self-pay | Admitting: Family Medicine

## 2023-12-22 VITALS — BP 100/57 | HR 73 | Temp 98.5°F | Ht 69.0 in | Wt 196.2 lb

## 2023-12-22 DIAGNOSIS — N179 Acute kidney failure, unspecified: Secondary | ICD-10-CM | POA: Diagnosis not present

## 2023-12-22 DIAGNOSIS — D649 Anemia, unspecified: Secondary | ICD-10-CM

## 2023-12-22 DIAGNOSIS — K529 Noninfective gastroenteritis and colitis, unspecified: Secondary | ICD-10-CM

## 2023-12-22 DIAGNOSIS — E876 Hypokalemia: Secondary | ICD-10-CM | POA: Diagnosis not present

## 2023-12-22 DIAGNOSIS — Z09 Encounter for follow-up examination after completed treatment for conditions other than malignant neoplasm: Secondary | ICD-10-CM

## 2023-12-22 DIAGNOSIS — I10 Essential (primary) hypertension: Secondary | ICD-10-CM

## 2023-12-22 DIAGNOSIS — J9611 Chronic respiratory failure with hypoxia: Secondary | ICD-10-CM

## 2023-12-22 DIAGNOSIS — I5032 Chronic diastolic (congestive) heart failure: Secondary | ICD-10-CM

## 2023-12-22 NOTE — Progress Notes (Signed)
 Acute Office Visit  Subjective:     Patient ID: Kelly Donaldson, female    DOB: [birth year], 68 y.o.   MRN: [id number]  Chief Complaint  Patient presents with   Hospitalization Follow-up    HPI Patient is in today for hospital follow up. She was admitted at [facility] on 12/08/23 and discharged on 12/09/23 for gastroenteritis and AKI.   Per discharge summary:  This is a 68 y.o. female with a known history of home O2-dependent diastolic CHF and COPD on 2L at baseline, HTN, HLD, depression, PAH, PVD, sarcoidosis, iron  deficiency anemia presents to the emergency department for evaluation of one day history of N/V/D as well as right hip pain.  Otherwise there has been no change in status. Patient has been taking medication as prescribed and there has been no recent change in medication or diet. There has been no recent illness/hospitalizations, travel or sick contacts. Patient denies fevers/chills, weakness, dizziness, chest pain, shortness of breath, N/V/C/D, abdominal pain, dysuria/frequency, changes in mental status. In the ED the patient received Rocephin, Toradol, Zofran , NS 500cc x3. Medical admission was requested for further workup and management of AKI 2/2 dehydration, gastroenteritis.  Detailed treatment/management during this hospitalization mentioned below:  AKI 2/2 dehydration Resolved, serum creatinine now at 1.06, down from admission when it was 1.20. Patient treated/managed with IV hydration, started to tolerate p.o. intake as well. Will discharge home, will follow-up with her PCP within 7 to 14 days of being discharged. Plan: - Admit IP - Gentle IVF hydration - Trend BMP  Gastroenteritis Improved, patient initially tolerating clear liquids, now having solid foods. Denies any nausea or vomiting. Plan: - Advance diet as tol to heart healthy - Check stool culture and stool Cdiff given history  - Continue Bentyl  Mild hypokalemia Improved, last potassium was 3.4. Magnesium  was normal at 1.9. She received some potassium chloride  repletion, now tolerating p.o. intake. Plan: -Encouraged increasing her p.o. intake -Will order potassium chloride  40 mill equivalents p.o. oral to be taken prior to discharge   HFpEF last EF 65-70% (4/24) Stable, not volume overloaded. Breathing on room air. Plan: - Continue torsemide   COPD Chronic, stable. No increased work of breathing, not on supplemental oxygen . Speaking complete sentences. On home oxygen  therapy. Plan: - Continue O2 - Continue Breztri , Flonase , montelukast , nebs PRN  HTN Controlled. Plan: - Monitor closely  HLD On statin therapy at home. Plan: - Continue pravastatin   Depression Stable, denies any SI/HI. On antihypertensives. Plan: - Contnue duloxetine , citalopram , aripiprazole   PAH Stable, Plan: - Resume home meds   Iron  deficiency anemia Hemoglobin/hematocrit stable at 9.9/31.0, which is at her baseline. Plan: - Monitor CBC - Continue ferusol   She feels better since discharge. No nausea, vomiting since then. Denies increased in shortness of breath, wheezing. Denies lightheadedness, dizziness. Denies edema.     ROS As per HPI.      Objective:    BP (!) 100/57   Pulse 73   Temp 98.5 F (36.9 C) (Temporal)   Ht 5' 9 (1.753 m)   Wt 196 lb 3.2 oz (89 kg)   SpO2 91% Comment: on 2L via [place]  BMI 28.97 kg/m  BP Readings from Last 3 Encounters:  12/22/23 (!) 100/57  10/20/23 (!) 90/50  09/17/23 (!) 88/56      Physical Exam Vitals and nursing note reviewed.  Constitutional:      General: She is not in acute distress.    Appearance: Normal appearance. She is not  ill-appearing.  Cardiovascular:     Rate and Rhythm: Normal rate and regular rhythm.     Pulses: Normal pulses.     Heart sounds: Normal heart sounds. No murmur heard. Pulmonary:     Effort: Pulmonary effort is normal. No respiratory distress.     Breath sounds: Normal breath sounds.  Abdominal:     General:  Bowel sounds are normal. There is no distension.     Palpations: Abdomen is soft. There is no mass.     Tenderness: There is no abdominal tenderness. There is no guarding or rebound.  Musculoskeletal:     Cervical back: Neck supple. No tenderness.     Right lower leg: No edema.     Left lower leg: No edema.  Lymphadenopathy:     Cervical: No cervical adenopathy.  Skin:    General: Skin is warm and dry.  Neurological:     General: No focal deficit present.     Mental Status: She is alert and oriented to person, place, and time.  Psychiatric:        Mood and Affect: Mood normal.        Behavior: Behavior normal.     No results found for any visits on 12/22/23.      Assessment & Plan:   Via was seen today for hospitalization follow-up.  Diagnoses and all orders for this visit:  Gastroenteritis Now resolved. Labs pending.  -     BMP8+EGFR -     CBC with Differential/Platelet  AKI (acute kidney injury) (HCC) Due to dehydration from gastroenteritis. Labs pending as below.  -     BMP8+EGFR -     CBC with Differential/Platelet  Hypokalemia Replaced prior to discharge. Labs pending.  -     BMP8+EGFR  Primary hypertension BP well controlled.   Chronic diastolic CHF (congestive heart failure) (HCC) Euvolemic today.  Chronic respiratory failure with hypoxia (HCC) Stable.   Chronic anemia CBC pending.  -     CBC with Differential/Platelet  Hospital discharge follow-up Reviewed hospital records.    Return if symptoms worsen or fail to improve.  The patient indicates understanding of these issues and agrees with the plan.  Kelly CHRISTELLA Search, FNP

## 2023-12-23 LAB — BMP8+EGFR
BUN/Creatinine Ratio: 12 (ref 12–28)
BUN: 13 mg/dL (ref 8–27)
CO2: 26 mmol/L (ref 20–29)
Calcium: 9.1 mg/dL (ref 8.7–10.3)
Chloride: 99 mmol/L (ref 96–106)
Creatinine, Ser: 1.08 mg/dL — ABNORMAL HIGH (ref 0.57–1.00)
Glucose: 92 mg/dL (ref 70–99)
Potassium: 4.3 mmol/L (ref 3.5–5.2)
Sodium: 141 mmol/L (ref 134–144)
eGFR: 56 mL/min/{1.73_m2} — ABNORMAL LOW (ref >59)

## 2023-12-23 LAB — CBC WITH DIFFERENTIAL/PLATELET
Basophils Absolute: 0 10*3/uL (ref 0.0–0.2)
Basos: 0 %
EOS (ABSOLUTE): 0.2 10*3/uL (ref 0.0–0.4)
Eos: 3 %
Hematocrit: 33.6 % — ABNORMAL LOW (ref 34.0–46.6)
Hemoglobin: 10.5 g/dL — ABNORMAL LOW (ref 11.1–15.9)
Immature Grans (Abs): 0 10*3/uL (ref 0.0–0.1)
Immature Granulocytes: 0 %
Lymphocytes Absolute: 1 10*3/uL (ref 0.7–3.1)
Lymphs: 16 %
MCH: 26.8 pg (ref 26.6–33.0)
MCHC: 31.3 g/dL — ABNORMAL LOW (ref 31.5–35.7)
MCV: 86 fL (ref 79–97)
Monocytes Absolute: 0.4 10*3/uL (ref 0.1–0.9)
Monocytes: 6 %
Neutrophils Absolute: 4.6 10*3/uL (ref 1.4–7.0)
Neutrophils: 75 %
Platelets: 239 10*3/uL (ref 150–450)
RBC: 3.92 x10E6/uL (ref 3.77–5.28)
RDW: 14.7 % (ref 11.7–15.4)
WBC: 6.2 10*3/uL (ref 3.4–10.8)

## 2023-12-25 ENCOUNTER — Other Ambulatory Visit (HOSPITAL_COMMUNITY): Payer: Self-pay | Admitting: Cardiology

## 2023-12-25 MED ORDER — ADEMPAS 2.5 MG PO TABS: 2.5000 mg | ORAL_TABLET | Freq: Three times a day (TID) | ORAL | 4 refills | Status: DC

## 2023-12-29 ENCOUNTER — Other Ambulatory Visit (HOSPITAL_COMMUNITY): Payer: Self-pay | Admitting: Cardiology

## 2024-01-07 ENCOUNTER — Other Ambulatory Visit (HOSPITAL_COMMUNITY): Payer: Self-pay

## 2024-01-08 ENCOUNTER — Telehealth (HOSPITAL_COMMUNITY): Payer: Self-pay | Admitting: Pharmacy Technician

## 2024-01-08 ENCOUNTER — Other Ambulatory Visit (HOSPITAL_COMMUNITY): Payer: Self-pay

## 2024-01-08 NOTE — Telephone Encounter (Signed)
Patient Advocate Encounter   Received notification from [facility] that prior authorization for Adempas is required.   PA submitted on CoverMyMeds Key [id number] Status is pending   Will continue to follow.

## 2024-01-08 NOTE — Telephone Encounter (Signed)
Advanced Heart Failure Patient Advocate Encounter  Prior Authorization for Adempas has been approved.    PA# [id number] Effective dates: 12/10/23 through 12/08/24  Archer Asa, CPhT

## 2024-01-17 ENCOUNTER — Other Ambulatory Visit: Payer: Self-pay | Admitting: Family Medicine

## 2024-01-17 DIAGNOSIS — F331 Major depressive disorder, recurrent, moderate: Secondary | ICD-10-CM

## 2024-02-05 ENCOUNTER — Other Ambulatory Visit (HOSPITAL_COMMUNITY): Payer: Self-pay | Admitting: Cardiology

## 2024-02-05 DIAGNOSIS — I270 Primary pulmonary hypertension: Secondary | ICD-10-CM

## 2024-02-05 MED ORDER — OPSUMIT 10 MG PO TABS: ORAL_TABLET | ORAL | 11 refills | Status: AC

## 2024-02-11 ENCOUNTER — Ambulatory Visit: Admitting: Family Medicine

## 2024-02-11 ENCOUNTER — Encounter: Payer: Self-pay | Admitting: Family Medicine

## 2024-02-11 ENCOUNTER — Ambulatory Visit (INDEPENDENT_AMBULATORY_CARE_PROVIDER_SITE_OTHER)

## 2024-02-11 VITALS — BP 89/56 | HR 70 | Temp 98.7°F | Ht 69.0 in | Wt 194.0 lb

## 2024-02-11 DIAGNOSIS — I2721 Secondary pulmonary arterial hypertension: Secondary | ICD-10-CM | POA: Diagnosis not present

## 2024-02-11 DIAGNOSIS — S99921A Unspecified injury of right foot, initial encounter: Secondary | ICD-10-CM

## 2024-02-11 DIAGNOSIS — J069 Acute upper respiratory infection, unspecified: Secondary | ICD-10-CM | POA: Diagnosis not present

## 2024-02-11 DIAGNOSIS — J9611 Chronic respiratory failure with hypoxia: Secondary | ICD-10-CM | POA: Diagnosis not present

## 2024-02-11 MED ORDER — FLUCONAZOLE 150 MG PO TABS: 150.0000 mg | ORAL_TABLET | Freq: Once | ORAL | 0 refills | Status: AC

## 2024-02-11 MED ORDER — PROMETHAZINE-DM 6.25-15 MG/5ML PO SYRP
2.5000 mL | ORAL_SOLUTION | Freq: Four times a day (QID) | ORAL | 0 refills | Status: DC | PRN
Start: 2024-02-11 — End: 2024-03-18

## 2024-02-11 MED ORDER — DOXYCYCLINE HYCLATE 100 MG PO TABS: 100.0000 mg | ORAL_TABLET | Freq: Two times a day (BID) | ORAL | 0 refills | Status: AC

## 2024-02-11 MED ORDER — PREDNISONE 20 MG PO TABS: ORAL_TABLET | ORAL | 0 refills | Status: DC

## 2024-02-11 NOTE — Progress Notes (Signed)
 Subjective: CC: URI PCP: [name], DO ZOX:[id number] Kelly Donaldson is a 68 y.o. female presenting to clinic today for:  1.  URI Patient reports that she had what she thinks was flu about 2 and half weeks ago.  One of her family members was diagnosed with that and she developed similar symptoms later.  She has persistent cough but denies any hemoptysis.  Her oxygen is also running lower than normal.  Typically she runs in the 90s on 2 Kelly but she has had to increase it to 3 Kelly because she was dropping down into the upper 80s.  She has persistent cough that is not relieved by OTC medications.  She is compliant with her inhaler with last use of nebulizer yesterday.  2.  Toe injury She reports that she dropped her oxygen concentrator on her right foot last week and thinks she broke her toe as she had immediately swelling and discoloration.  She is able to ambulate independently but wanted to make note of this  ROS: Per HPI  Allergies  Allergen Reactions   Gabapentin Other (See Comments)    Pt states that she can not take with antidepressants   Latex Rash   Past Medical History:  Diagnosis Date   Allergy    Depression    GERD (gastroesophageal reflux disease)    Hyperlipidemia    Oxygen dependent    Shortness of breath dyspnea    Varicose veins     Current Outpatient Medications:    Accu-Chek Softclix Lancets lancets, Check glucose BID Dx R73.03, Disp: 200 each, Rfl: 3   albuterol (VENTOLIN HFA) 108 (90 Base) MCG/ACT inhaler, INHALE 2 PUFFS EVERY 4 HOURS AS NEEDED FOR WHEEZING OR SHORTNESS OF BREATH, Disp: 18 g, Rfl: 0   Alcohol Swabs (B-D SINGLE USE SWABS REGULAR) PADS, Check glucose BID Dx R73.03, Disp: 200 each, Rfl: 3   aspirin EC 81 MG tablet, Take 1 tablet (81 mg total) by mouth daily., Disp: 90 tablet, Rfl: 3   azaTHIOprine (IMURAN) 50 MG tablet, Take 1 tablet (50 mg total) by mouth in the morning and at bedtime., Disp: 60 tablet, Rfl: 11   Blood Glucose Monitoring Suppl  (ACCU-CHEK AVIVA PLUS) w/Device KIT, Check glucose BID Dx R73.03, Disp: 1 kit, Rfl: 0   BREZTRI AEROSPHERE 160-9-4.8 MCG/ACT AERO, INHALE 2 PUFFS INTO THE LUNGS IN THE MORNING AND AT BEDTIME., Disp: 3 each, Rfl: 3   citalopram (CELEXA) 20 MG tablet, TAKE 1 TABLET (20 MG TOTAL) BY MOUTH DAILY., Disp: 90 tablet, Rfl: 0   dicyclomine (BENTYL) 20 MG tablet, Take 20 mg by mouth daily., Disp: , Rfl:    ferrous sulfate (FERROUSUL) 325 (65 FE) MG tablet, Take 1 tablet (325 mg total) by mouth daily with breakfast., Disp: 90 tablet, Rfl: 3   fluticasone (FLONASE) 50 MCG/ACT nasal spray, SPRAY 1 SPRAY INTO BOTH NOSTRILS DAILY., Disp: 48 mL, Rfl: 3   glucose blood (ACCU-CHEK AVIVA PLUS) test strip, Check glucose BID Dx R73.03, Disp: 200 each, Rfl: 3   HYDROcodone-acetaminophen (NORCO) 10-325 MG tablet, Take 0.5-1 tablets by mouth up to 4- 5 times daily only as needed for chronic multifactorial pain., Disp: 150 tablet, Rfl: 0   ipratropium-albuterol (DUONEB) 0.5-2.5 (3) MG/3ML SOLN, INHALE THE CONTENTS OF 1 VIAL VIA NEBULIZER FOUR TIMES DAILY AS NEEDED, Disp: 360 mL, Rfl: 1   Lancets Misc. (ACCU-CHEK SOFTCLIX LANCET DEV) KIT, Check glucose BID Dx R73.03, Disp: 1 kit, Rfl: 0   loratadine (CLARITIN) 10 MG  tablet, Take 1 tablet (10 mg total) by mouth daily., Disp: 90 tablet, Rfl: 3   macitentan (OPSUMIT) 10 MG tablet, TAKE 1 TABLET (10MG ) BY MOUTH DAILY, Disp: 30 tablet, Rfl: 11   montelukast (SINGULAIR) 10 MG tablet, Take 1 tablet (10 mg total) by mouth at bedtime., Disp: 30 tablet, Rfl: 11   OXYGEN, Take 2.5-4 Kelly by mouth continuous. 2.5 Kelly with resting state & 4 Kelly with exertion [facility], Disp: , Rfl:    Polyethyl Glycol-Propyl Glycol 0.4-0.3 % SOLN, Place 1 drop into both eyes daily., Disp: , Rfl:    potassium chloride SA (KLOR-CON M) 20 MEQ tablet, Take 20 mEq by mouth 2 (two) times daily., Disp: , Rfl:    pravastatin (PRAVACHOL) 20 MG tablet, Take 1 tablet (20 mg total) by mouth daily., Disp: 90 tablet, Rfl:  3   promethazine-dextromethorphan (PROMETHAZINE-DM) 6.25-15 MG/5ML syrup, TAKE 2.5 MLS BY MOUTH 4 (FOUR) TIMES DAILY AS NEEDED FOR COUGH., Disp: 240 mL, Rfl: 0   Riociguat (ADEMPAS) 2.5 MG TABS, Take 2.5 mg by mouth in the morning, at noon, and at bedtime., Disp: 200 tablet, Rfl: 4   Selexipag (UPTRAVI) 200 MCG TABS, TAKE 1 TABLET TWICE DAILY, Disp: 60 tablet, Rfl: 2   torsemide (DEMADEX) 20 MG tablet, TAKE 3 TABLETS EVERY MORNING AND TAKE 2 TABLETS EVERY EVENING (CHANGE IN DOSE), Disp: 450 tablet, Rfl: 3   Vitamin D, Ergocalciferol, (DRISDOL) 1.25 MG (50000 UNIT) CAPS capsule, Take 1 capsule (50,000 Units total) by mouth every 7 (seven) days., Disp: 12 capsule, Rfl: 3   zolpidem (AMBIEN) 5 MG tablet, Take 1 tablet (5 mg total) by mouth at bedtime as needed for sleep (please allow to fill every 30 days)., Disp: 30 tablet, Rfl: 5 Social History   Socioeconomic History   Marital status: Married    Spouse name: Jimmy   Number of children: 3   Years of education: Not on file   Highest education level: 12th grade  Occupational History   Occupation: Disabled  Tobacco Use   Smoking status: Former    Current packs/day: 0.00    Average packs/day: 0.3 packs/day for 17.0 years (4.3 ttl pk-yrs)    Types: Cigarettes    Start date: 03/01/1998    Quit date: 03/02/2015    Years since quitting: 8.9   Smokeless tobacco: Never  Vaping Use   Vaping status: Never Used  Substance and Sexual Activity   Alcohol use: No    Alcohol/week: 0.0 standard drinks of alcohol   Drug use: Yes    Types: Cocaine    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 3 children. Enjoys gardening and spending time with grandchildren.    Social Drivers of Corporate investment banker Strain: Low Risk  (09/10/2023)   Overall Financial Resource Strain (CARDIA)    Difficulty of Paying Living  Expenses: Not hard at all  Food Insecurity: No Food Insecurity (09/10/2023)   Hunger Vital Sign    Worried About Running Out of Food in the Last Year: Never true    Ran Out of Food in the Last Year: Never true  Transportation Needs: No Transportation Needs (09/10/2023)   PRAPARE - Administrator, Civil Service (Medical): No    Lack of Transportation (Non-Medical): No  Physical Activity: Inactive (09/10/2023)   Exercise Vital Sign  Days of Exercise per Week: 0 days    Minutes of Exercise per Session: 0 min  Stress: No Stress Concern Present (09/10/2023)   [employer] of Occupational Health - Occupational Stress Questionnaire    Feeling of Stress : Not at all  Social Connections: Moderately Integrated (09/10/2023)   Social Connection and Isolation Panel [NHANES]    Frequency of Communication with Friends and Family: More than three times a week    Frequency of Social Gatherings with Friends and Family: More than three times a week    Attends Religious Services: More than 4 times per year    Active Member of [employer] or Organizations: No    Attends Banker Meetings: Never    Marital Status: Married  Catering manager Violence: Not At Risk (09/10/2023)   Humiliation, Afraid, Rape, and Kick questionnaire    Fear of Current or Ex-Partner: No    Emotionally Abused: No    Physically Abused: No    Sexually Abused: No   Family History  Problem Relation Age of Onset   Hyperlipidemia Mother    Hypertension Mother    Diabetes Mother    Other Mother        varicose veins   Hyperlipidemia Father    Hypertension Father    Emphysema Father        smoked   Hypertension Daughter    Asthma Sister    Clotting disorder Daughter    Breast cancer Maternal Grandmother    Ovarian cancer Maternal Aunt     Objective: Office vital signs reviewed. BP (!) 89/56   Pulse 70   Temp 98.7 F (37.1 C)   Ht 5\' 9"  (1.753 m)   Wt 194 lb (88 kg)   BMI 28.65 kg/m   Physical  Examination:  General: Awake, alert, nontoxic appearing female, No acute distress HEENT: Normal    Neck: No masses palpated. No lymphadenopathy    Ears: Tympanic membranes intact, normal light reflex, no erythema, no bulging    Eyes: PERRLA, extraocular membranes intact, sclera white    Nose: nasal turbinates moist, clear nasal discharge    Throat: moist mucus membranes, no erythema, no tonsillar exudate.  Airway is patent Cardio: regular rate and rhythm, S1S2 heard, no murmurs appreciated Pulm: Slight reduction of breath sounds on the left lung fields but otherwise clear to auscultation bilaterally with normal work of breathing and no observed coughing on 3 Kelly via nasal cannula MSK: Right third digit with swelling throughout the entire digit.  She has slight swelling on the DIP of the second digit of the right foot.  No skin breakdown appreciated  No results found.   Assessment/ Plan: 68 y.o. female   URI with cough and congestion - Plan: DG Chest 2 View, fluconazole (DIFLUCAN) 150 MG tablet, doxycycline (VIBRA-TABS) 100 MG tablet, predniSONE (DELTASONE) 20 MG tablet, promethazine-dextromethorphan (PROMETHAZINE-DM) 6.25-15 MG/5ML syrup, CANCELED: Veritor Flu A/B Waived, CANCELED: Novel Coronavirus, NAA ([facility])  Chronic respiratory failure with hypoxia (HCC) - Plan: DG Chest 2 View, fluconazole (DIFLUCAN) 150 MG tablet, doxycycline (VIBRA-TABS) 100 MG tablet, predniSONE (DELTASONE) 20 MG tablet, promethazine-dextromethorphan (PROMETHAZINE-DM) 6.25-15 MG/5ML syrup  PAH (pulmonary artery hypertension) (HCC) - Plan: DG Chest 2 View, fluconazole (DIFLUCAN) 150 MG tablet, doxycycline (VIBRA-TABS) 100 MG tablet, predniSONE (DELTASONE) 20 MG tablet, promethazine-dextromethorphan (PROMETHAZINE-DM) 6.25-15 MG/5ML syrup  Injury of toe on right foot, initial encounter - Plan: DG Toe 3rd Right  I canceled her rapid testing because she sounds like she may have had  flu and this is a secondary infection.   I treated her with steroid, antibiotics and obtain chest x-ray.  I personally reviewed her chest x-ray and she has some chronic changes in the bases but I could not appreciate any acute pulmonary infiltrates to suggest secondary pneumonia.  Awaiting formal review by radiologist.  She did saturate on the lower end of the 80s today on 3 Kelly.  She was given an albuterol neb here in office in efforts to improve this and she did get a few percentiles improvement but she did not reach into the low 90s.  I advised her to increase to 4 Kelly and I am going to reach out to her pulmonologist for any other instructions.  I personally viewed the x-ray of her right third toe and I do appreciate a fracture in one of the views.  I offered referral for specialty intervention but she declined and we buddy taped this prior to discharge.  She should continue buddy taping for the next several weeks so as to reduce risk of displacement  Kelly Steinmeyer Hulen Skains, DO [facility] [phone]

## 2024-02-13 ENCOUNTER — Encounter: Payer: Self-pay | Admitting: Family Medicine

## 2024-02-18 ENCOUNTER — Other Ambulatory Visit: Payer: Self-pay | Admitting: Family Medicine

## 2024-02-18 DIAGNOSIS — F331 Major depressive disorder, recurrent, moderate: Secondary | ICD-10-CM

## 2024-02-18 NOTE — Telephone Encounter (Signed)
 Copied from CRM [id number]. Topic: Clinical - Medication Refill >> Feb 18, 2024 12:57 PM Eunice Blase wrote: Most Recent Primary Care Visit:   Medication: citalopram (CELEXA) 20 MG tablet  Has the patient contacted their pharmacy? Yes (Agent: If no, request that the patient contact the pharmacy for the refill. If patient does not wish to contact the pharmacy document the reason why and proceed with request.) (Agent: If yes, when and what did the pharmacy advise?)Pharmacy needs PCP approval  Is this the correct pharmacy for this prescription? Yes If no, delete pharmacy and type the correct one.  This is the patient's preferred pharmacy:    [phone] [facility] [place], [place] - [address] [place] [address] Phone: [phone] Fax: [phone]     Has the prescription been filled recently? Yes  Is the patient out of the medication? Yes  Has the patient been seen for an appointment in the last year OR does the patient have an upcoming appointment? Yes  Can we respond through MyChart? Yes  Agent: Please be advised that Rx refills may take up to 3 business days. We ask that you follow-up with your pharmacy.

## 2024-02-19 ENCOUNTER — Telehealth: Payer: Self-pay | Admitting: Family Medicine

## 2024-02-19 NOTE — Telephone Encounter (Signed)
 Never received meds via Mail order. I told her to reach out to them and if there is a issue we can call into local pharmacy.

## 2024-02-19 NOTE — Telephone Encounter (Unsigned)
 Copied from CRM [id number]. Topic: Clinical - Medication Refill >> Feb 18, 2024 12:57 PM Eunice Blase wrote: Most Recent Primary Care Visit:   Medication: citalopram (CELEXA) 20 MG tablet  Has the patient contacted their pharmacy? Yes (Agent: If no, request that the patient contact the pharmacy for the refill. If patient does not wish to contact the pharmacy document the reason why and proceed with request.) (Agent: If yes, when and what did the pharmacy advise?)Pharmacy needs PCP approval  Is this the correct pharmacy for this prescription? Yes If no, delete pharmacy and type the correct one.  This is the patient's preferred pharmacy:    [phone] [place], [place] - [address] [place] [address] Phone: [phone] Fax: [phone]     Has the prescription been filled recently? Yes  Is the patient out of the medication? Yes  Has the patient been seen for an appointment in the last year OR does the patient have an upcoming appointment? Yes  Can we respond through MyChart? Yes  Agent: Please be advised that Rx refills may take up to 3 business days. We ask that you follow-up with your pharmacy. >> Feb 19, 2024 11:48 AM Victorino Dike T wrote: Patient calling again, she is completely out of the medication and is needing to pick it up today.

## 2024-02-26 ENCOUNTER — Telehealth (HOSPITAL_COMMUNITY): Payer: Self-pay

## 2024-02-26 ENCOUNTER — Other Ambulatory Visit (HOSPITAL_COMMUNITY): Payer: Self-pay | Admitting: Pharmacist

## 2024-02-26 MED ORDER — UPTRAVI 200 MCG PO TABS: 1.0000 | ORAL_TABLET | Freq: Two times a day (BID) | ORAL | 2 refills | Status: DC

## 2024-02-26 NOTE — Telephone Encounter (Signed)
 Advanced Heart Failure Patient Advocate Encounter  Received notification from [facility] support team that patient has contacted [facility] to [facility] Uptravi. Records indicate that this patient has been using [facility] pharmacy.   I spoke with patient by phone and she is under the impression that her insurance prefers or is requiring her to use [facility] to fill. [facility].  New rx has been sent in to [facility] pharmacy and [facility] support team has been informed.  Burnell Blanks, CPhT Rx Patient Advocate Phone: [phone]

## 2024-02-27 ENCOUNTER — Telehealth: Payer: Self-pay | Admitting: Pulmonary Disease

## 2024-02-27 DIAGNOSIS — I2721 Secondary pulmonary arterial hypertension: Secondary | ICD-10-CM

## 2024-02-27 MED ORDER — IPRATROPIUM BROMIDE 0.02 % IN SOLN
0.5000 mg | Freq: Once | RESPIRATORY_TRACT | Status: AC
  Administered 2024-02-27: 0.5 mg via RESPIRATORY_TRACT

## 2024-02-27 NOTE — Addendum Note (Signed)
 Addended by: Waynette Buttery on: 02/27/2024 09:44 AM   Modules accepted: Orders

## 2024-03-10 NOTE — Telephone Encounter (Signed)
 Rec'd 2nd CMN from [facility]. No indication in chart that it was signed and ret. Will put in Dr. Laurena Spies box for signature.

## 2024-03-17 NOTE — Telephone Encounter (Signed)
 CMN faxed successfully and signed.

## 2024-03-18 ENCOUNTER — Ambulatory Visit: Payer: Medicare PPO | Admitting: Family Medicine

## 2024-03-18 ENCOUNTER — Encounter: Payer: Self-pay | Admitting: Family Medicine

## 2024-03-18 VITALS — BP 96/53 | HR 60 | Temp 98.6°F | Ht 69.0 in | Wt 191.0 lb

## 2024-03-18 DIAGNOSIS — I5032 Chronic diastolic (congestive) heart failure: Secondary | ICD-10-CM | POA: Diagnosis not present

## 2024-03-18 DIAGNOSIS — E7800 Pure hypercholesterolemia, unspecified: Secondary | ICD-10-CM

## 2024-03-18 DIAGNOSIS — J9611 Chronic respiratory failure with hypoxia: Secondary | ICD-10-CM

## 2024-03-18 DIAGNOSIS — Z1211 Encounter for screening for malignant neoplasm of colon: Secondary | ICD-10-CM

## 2024-03-18 DIAGNOSIS — I739 Peripheral vascular disease, unspecified: Secondary | ICD-10-CM

## 2024-03-18 DIAGNOSIS — Z1159 Encounter for screening for other viral diseases: Secondary | ICD-10-CM

## 2024-03-18 DIAGNOSIS — J432 Centrilobular emphysema: Secondary | ICD-10-CM

## 2024-03-18 DIAGNOSIS — M85851 Other specified disorders of bone density and structure, right thigh: Secondary | ICD-10-CM

## 2024-03-18 DIAGNOSIS — I2729 Other secondary pulmonary hypertension: Secondary | ICD-10-CM

## 2024-03-18 DIAGNOSIS — Z0001 Encounter for general adult medical examination with abnormal findings: Secondary | ICD-10-CM | POA: Diagnosis not present

## 2024-03-18 DIAGNOSIS — F331 Major depressive disorder, recurrent, moderate: Secondary | ICD-10-CM

## 2024-03-18 DIAGNOSIS — D509 Iron deficiency anemia, unspecified: Secondary | ICD-10-CM

## 2024-03-18 DIAGNOSIS — F5104 Psychophysiologic insomnia: Secondary | ICD-10-CM

## 2024-03-18 DIAGNOSIS — Z0000 Encounter for general adult medical examination without abnormal findings: Secondary | ICD-10-CM

## 2024-03-18 DIAGNOSIS — R7303 Prediabetes: Secondary | ICD-10-CM

## 2024-03-18 LAB — BAYER DCA HB A1C WAIVED: HB A1C (BAYER DCA - WAIVED): 5.6 % (ref 4.8–5.6)

## 2024-03-18 MED ORDER — SLOW IRON 160 (50 FE) MG PO TBCR: 160.0000 mg | EXTENDED_RELEASE_TABLET | Freq: Every day | ORAL | 3 refills | Status: AC

## 2024-03-18 MED ORDER — CITALOPRAM HYDROBROMIDE 20 MG PO TABS: 20.0000 mg | ORAL_TABLET | Freq: Every day | ORAL | 3 refills | Status: DC

## 2024-03-18 MED ORDER — ZOLPIDEM TARTRATE 5 MG PO TABS: 5.0000 mg | ORAL_TABLET | Freq: Every evening | ORAL | 5 refills | Status: DC | PRN

## 2024-03-18 MED ORDER — PRAVASTATIN SODIUM 20 MG PO TABS: 20.0000 mg | ORAL_TABLET | Freq: Every day | ORAL | 3 refills | Status: DC

## 2024-03-18 NOTE — Progress Notes (Signed)
 Kelly Donaldson is a 68 y.o. female presents to office today for annual physical exam examination.    Concerns today include: 1.  She eats ice all the time.  Was previously told that she is iron deficient anemic but cannot tolerate oral iron due to constipation.  She has chronic respiratory failure and has been stable on 3 L of oxygen via nasal cannula at home.  No tobacco use, alcohol use or drug use.  She continues to use sleep aid as directed.  No excessive daytime sedation, falls.  They got a new puppy, Gizmo.  Occupation: retired, Marital status: married, Substance use: none There are no preventive care reminders to display for this patient.  Refills needed today: All  Immunization History  Administered Date(s) Administered   Fluad Quad(high Dose 65+) 09/27/2022   Fluad Trivalent(High Dose 65+) 09/17/2023   Influenza Split 09/16/2014, 09/20/2015, 09/08/2017, 10/04/2017   Influenza,inj,Quad PF,6+ Mos 09/04/2016, 11/12/2018, 08/17/2019, 09/07/2020   Influenza-Unspecified 10/13/2017, 08/29/2021   Moderna Sars-Covid-2 Vaccination 02/21/2020, 03/20/2020, 12/16/2020   PNEUMOCOCCAL CONJUGATE-20 03/27/2022   Pneumococcal Polysaccharide-23 11/11/2019   Tdap 03/20/2023   Zoster Recombinant(Shingrix) 09/20/2020, 04/17/2021   Past Medical History:  Diagnosis Date   Allergy    Depression    GERD (gastroesophageal reflux disease)    Hyperlipidemia    Oxygen dependent    Shortness of breath dyspnea    Varicose veins    Social History   Socioeconomic History   Marital status: Married    Spouse name: Jimmy   Number of children: 3   Years of education: Not on file   Highest education level: 12th grade  Occupational History   Occupation: Disabled  Tobacco Use   Smoking status: Former    Current packs/day: 0.00    Average packs/day: 0.3 packs/day for 17.0 years (4.3 ttl pk-yrs)    Types: Cigarettes    Start date: 03/01/1998    Quit date: 03/02/2015    Years since quitting: 9.0    Smokeless tobacco: Never  Vaping Use   Vaping status: Never Used  Substance and Sexual Activity   Alcohol use: No    Alcohol/week: 0.0 standard drinks of alcohol   Drug use: Yes    Types: Cocaine    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 3 children. Enjoys gardening and spending time with grandchildren.    Social Drivers of Corporate investment banker Strain: Low Risk  (09/10/2023)   Overall Financial Resource Strain (CARDIA)    Difficulty of Paying Living Expenses: Not hard at all  Food Insecurity: No Food Insecurity (09/10/2023)   Hunger Vital Sign    Worried About Running Out of Food in the Last Year: Never true    Ran Out of Food in the Last Year: Never true  Transportation Needs: No Transportation Needs (09/10/2023)   PRAPARE - Administrator, Civil Service (Medical): No    Lack of Transportation (Non-Medical): No  Physical Activity: Inactive (09/10/2023)   Exercise Vital Sign    Days of Exercise per Week: 0 days    Minutes of Exercise per Session: 0 min  Stress: No Stress Concern Present (09/10/2023)   [employer] of Occupational Health - Occupational Stress Questionnaire    Feeling of Stress : Not at all  Social Connections: Moderately Integrated (09/10/2023)   Social Connection and  Isolation Panel [NHANES]    Frequency of Communication with Friends and Family: More than three times a week    Frequency of Social Gatherings with Friends and Family: More than three times a week    Attends Religious Services: More than 4 times per year    Active Member of Clubs or Organizations: No    Attends Banker Meetings: Never    Marital Status: Married  Catering manager Violence: Not At Risk (09/10/2023)   Humiliation, Afraid, Rape, and Kick questionnaire    Fear of Current or Ex-Partner: No    Emotionally Abused: No     Physically Abused: No    Sexually Abused: No   Past Surgical History:  Procedure Laterality Date   CARDIAC CATHETERIZATION N/A 04/20/2015   Procedure: Right Heart Cath;  Surgeon: Laurey Morale, MD;  Location: [facility] INVASIVE CV LAB;  Service: Cardiovascular;  Laterality: N/A;   RIGHT HEART CATH N/A 06/13/2017   Procedure: Right Heart Cath;  Surgeon: Laurey Morale, MD;  Location: [facility] INVASIVE CV LAB;  Service: Cardiovascular;  Laterality: N/A;   RIGHT HEART CATH N/A 05/06/2018   Procedure: RIGHT HEART CATH;  Surgeon: Laurey Morale, MD;  Location: [facility] INVASIVE CV LAB;  Service: Cardiovascular;  Laterality: N/A;   RIGHT HEART CATH N/A 03/27/2023   Procedure: RIGHT HEART CATH;  Surgeon: Laurey Morale, MD;  Location: [facility] INVASIVE CV LAB;  Service: Cardiovascular;  Laterality: N/A;   TEE WITHOUT CARDIOVERSION N/A 05/11/2015   Procedure: TRANSESOPHAGEAL ECHOCARDIOGRAM (TEE);  Surgeon: Laurey Morale, MD;  Location: [facility] ENDOSCOPY;  Service: Cardiovascular;  Laterality: N/A;   TUBAL LIGATION     11/84   Family History  Problem Relation Age of Onset   Hyperlipidemia Mother    Hypertension Mother    Diabetes Mother    Other Mother        varicose veins   Hyperlipidemia Father    Hypertension Father    Emphysema Father        smoked   Hypertension Daughter    Asthma Sister    Clotting disorder Daughter    Breast cancer Maternal Grandmother    Ovarian cancer Maternal Aunt     Current Outpatient Medications:    Accu-Chek Softclix Lancets lancets, Check glucose BID Dx R73.03, Disp: 200 each, Rfl: 3   albuterol (VENTOLIN HFA) 108 (90 Base) MCG/ACT inhaler, INHALE 2 PUFFS EVERY 4 HOURS AS NEEDED FOR WHEEZING OR SHORTNESS OF BREATH, Disp: 18 g, Rfl: 0   Alcohol Swabs (B-D SINGLE USE SWABS REGULAR) PADS, Check glucose BID Dx R73.03, Disp: 200 each, Rfl: 3   aspirin EC 81 MG tablet, Take 1 tablet (81 mg total) by mouth daily., Disp: 90 tablet, Rfl: 3   azaTHIOprine (IMURAN) 50 MG tablet, Take 1  tablet (50 mg total) by mouth in the morning and at bedtime., Disp: 60 tablet, Rfl: 11   Blood Glucose Monitoring Suppl (ACCU-CHEK AVIVA PLUS) w/Device KIT, Check glucose BID Dx R73.03, Disp: 1 kit, Rfl: 0   BREZTRI AEROSPHERE 160-9-4.8 MCG/ACT AERO, INHALE 2 PUFFS INTO THE LUNGS IN THE MORNING AND AT BEDTIME., Disp: 3 each, Rfl: 3   citalopram (CELEXA) 20 MG tablet, TAKE 1 TABLET (20 MG TOTAL) BY MOUTH DAILY., Disp: 90 tablet, Rfl: 0   dicyclomine (BENTYL) 20 MG tablet, Take 20 mg by mouth daily., Disp: , Rfl:    ferrous sulfate (SLOW IRON) 160 (50 Fe) MG TBCR SR tablet, Take 1 tablet (160 mg total) by  mouth daily., Disp: 100 tablet, Rfl: 3   fluticasone (FLONASE) 50 MCG/ACT nasal spray, SPRAY 1 SPRAY INTO BOTH NOSTRILS DAILY., Disp: 48 mL, Rfl: 3   glucose blood (ACCU-CHEK AVIVA PLUS) test strip, Check glucose BID Dx R73.03, Disp: 200 each, Rfl: 3   HYDROcodone-acetaminophen (NORCO) 10-325 MG tablet, Take 0.5-1 tablets by mouth up to 4- 5 times daily only as needed for chronic multifactorial pain., Disp: 150 tablet, Rfl: 0   ipratropium-albuterol (DUONEB) 0.5-2.5 (3) MG/3ML SOLN, INHALE THE CONTENTS OF 1 VIAL VIA NEBULIZER FOUR TIMES DAILY AS NEEDED, Disp: 360 mL, Rfl: 1   Lancets Misc. (ACCU-CHEK SOFTCLIX LANCET DEV) KIT, Check glucose BID Dx R73.03, Disp: 1 kit, Rfl: 0   loratadine (CLARITIN) 10 MG tablet, Take 1 tablet (10 mg total) by mouth daily., Disp: 90 tablet, Rfl: 3   macitentan (OPSUMIT) 10 MG tablet, TAKE 1 TABLET (10MG ) BY MOUTH DAILY, Disp: 30 tablet, Rfl: 11   montelukast (SINGULAIR) 10 MG tablet, Take 1 tablet (10 mg total) by mouth at bedtime., Disp: 30 tablet, Rfl: 11   OXYGEN, Take 2.5-4 L by mouth continuous. 2.5 L with resting state & 4 L with exertion [facility], Disp: , Rfl:    Polyethyl Glycol-Propyl Glycol 0.4-0.3 % SOLN, Place 1 drop into both eyes daily., Disp: , Rfl:    potassium chloride SA (KLOR-CON M) 20 MEQ tablet, Take 20 mEq by mouth 2 (two) times daily., Disp: ,  Rfl:    pravastatin (PRAVACHOL) 20 MG tablet, Take 1 tablet (20 mg total) by mouth daily., Disp: 90 tablet, Rfl: 3   Riociguat (ADEMPAS) 2.5 MG TABS, Take 2.5 mg by mouth in the morning, at noon, and at bedtime., Disp: 200 tablet, Rfl: 4   Selexipag (UPTRAVI) 200 MCG TABS, Take 1 tablet (200 mcg total) by mouth 2 (two) times daily., Disp: 60 tablet, Rfl: 2   torsemide (DEMADEX) 20 MG tablet, TAKE 3 TABLETS EVERY MORNING AND TAKE 2 TABLETS EVERY EVENING (CHANGE IN DOSE), Disp: 450 tablet, Rfl: 3   Vitamin D, Ergocalciferol, (DRISDOL) 1.25 MG (50000 UNIT) CAPS capsule, Take 1 capsule (50,000 Units total) by mouth every 7 (seven) days., Disp: 12 capsule, Rfl: 3   [START ON 04/07/2024] zolpidem (AMBIEN) 5 MG tablet, Take 1 tablet (5 mg total) by mouth at bedtime as needed for sleep (please allow to fill every 30 days)., Disp: 30 tablet, Rfl: 5  Allergies  Allergen Reactions   Gabapentin Other (See Comments)    Pt states that she can not take with antidepressants   Latex Rash     ROS: Review of Systems A comprehensive review of systems was negative except for: Respiratory: positive for dyspnea on exertion Musculoskeletal: positive for arthralgias, myalgias, and stiff joints Behavioral/Psych: positive for anxiety and stress from her spouse    Physical exam BP (!) 96/53   Pulse 60   Temp 98.6 F (37 C)   Ht 5\' 9"  (1.753 m)   Wt 191 lb (86.6 kg)   BMI 28.21 kg/m  General appearance: alert, cooperative, appears stated age, and no distress Head: Normocephalic, without obvious abnormality, atraumatic Eyes: negative findings: lids and lashes normal, conjunctivae and sclerae normal, corneas clear, and pupils equal, round, reactive to light and accomodation Ears: normal TM's and external ear canals both ears Nose: Nares normal. Septum midline. Mucosa normal. No drainage or sinus tenderness. Throat: lips, mucosa, and tongue normal; teeth and gums normal Neck: no adenopathy, supple, symmetrical,  trachea midline, and thyroid not enlarged, symmetric, no  tenderness/mass/nodules Back: symmetric, no curvature. ROM normal. No CVA tenderness. Lungs:  Decreased breath sounds in the apexes of the lungs bilaterally.  She has normal work of breathing on 3 L via nasal cannula Heart:  Regular rate and rhythm.  She has a murmur present. Abdomen: soft, non-tender; bowel sounds normal; no masses,  no organomegaly Extremities: extremities normal, atraumatic, no cyanosis or edema Pulses:  +1 pedal pulses bilaterally Skin: Skin color, texture, turgor normal. No rashes or lesions Lymph nodes: Cervical, supraclavicular, and axillary nodes normal. Neurologic: Grossly normal      03/18/2024   10:45 AM 02/11/2024    3:17 PM 02/11/2024    3:15 PM  Depression screen PHQ 2/9  Decreased Interest 1 3 0  Down, Depressed, Hopeless 0 1 0  PHQ - 2 Score 1 4 0  Altered sleeping 1 2 0  Tired, decreased energy 0 3 0  Change in appetite 0 3 0  Feeling bad or failure about yourself  0 1 0  Trouble concentrating 0 0 0  Moving slowly or fidgety/restless 0 0 0  Suicidal thoughts 0 0 0  PHQ-9 Score 2 13 0  Difficult doing work/chores Somewhat difficult Somewhat difficult       03/18/2024   10:45 AM 02/11/2024    3:17 PM 02/11/2024    3:15 PM 03/20/2023   10:13 AM  GAD 7 : Generalized Anxiety Score  Nervous, Anxious, on Edge 0 3 0 0  Control/stop worrying 0 2 0 0  Worry too much - different things 0 2 0 0  Trouble relaxing 1 1 0 0  Restless 0 1 0 0  Easily annoyed or irritable 1 2 0 1  Afraid - awful might happen 0 2 0 0  Total GAD 7 Score 2 13 0 1  Anxiety Difficulty   Not difficult at all Not difficult at all     Assessment/ Plan: [name] here for annual physical exam.   Encounter for annual physical exam  Pulmonary hypertension associated with sarcoidosis (HCC) - Plan: CBC  Peripheral vascular disease, unspecified (HCC) - Plan: CBC  Chronic diastolic CHF (congestive heart failure) (HCC) -  Plan: CBC  Chronic respiratory failure with hypoxia (HCC) - Plan: CBC  Centrilobular emphysema (HCC) - Plan: CBC  Iron deficiency anemia, unspecified iron deficiency anemia type - Plan: ferrous sulfate (SLOW IRON) 160 (50 Fe) MG TBCR SR tablet  Pure hypercholesterolemia - Plan: CMP14+EGFR, Lipid Panel, TSH  Psychophysiological insomnia - Plan: ToxASSURE Select 13 (MW), Urine, zolpidem (AMBIEN) 5 MG tablet  Prediabetes - Plan: Bayer DCA Hb A1c Waived  Osteopenia of neck of right femur - Plan: CBC, VITAMIN D 25 Hydroxy (Vit-D Deficiency, Fractures)  Encounter for hepatitis C screening test for low risk patient - Plan: Hepatitis C antibody  Screen for colon cancer - Plan: Cologuard  She did desaturate down into the 70s during our visit but after allowing to be at rest on 3 L via nasal cannula her oxygen came up to 88% on supplemental oxygen.  This is her baseline.  She will continue to follow-up with pulmonology for pulmonary sarcoidosis, chronic respiratory failure and emphysema.  She will continue to follow-up with cardiology for CHF.  Clinically was euvolemic on exam today.  She had no gross swelling of the extremities nor any rales on exam  Fasting labs were collected.  She will continue current medications as prescribed  Her Ambien was renewed and UDS and CSA were updated as per office policy.  National narcotic database reviewed with no red flags  Check A1c given history of prediabetes, vitamin D and CBC given history of osteopenia  Screen hepatitis C collected and I have ordered Cologuard as she was told previously that she was not a candidate for colonoscopy due to advanced medical issues.  She reports no blood or other concerning features suggestive of colon cancer  Counseled on healthy lifestyle choices, including diet (rich in fruits, vegetables and lean meats and low in salt and simple carbohydrates) and exercise (at least 30 minutes of moderate physical activity  daily).  Patient to follow up 6 months  Lara Palinkas M. Nadine Counts, DO

## 2024-03-19 ENCOUNTER — Other Ambulatory Visit (HOSPITAL_COMMUNITY): Payer: Self-pay

## 2024-03-19 ENCOUNTER — Encounter: Payer: Medicare PPO | Admitting: Family Medicine

## 2024-03-19 LAB — LIPID PANEL
Chol/HDL Ratio: 2.6 ratio (ref 0.0–4.4)
Cholesterol, Total: 242 mg/dL — ABNORMAL HIGH (ref 100–199)
HDL: 93 mg/dL (ref >39)
LDL Chol Calc (NIH): 134 mg/dL — ABNORMAL HIGH (ref 0–99)
Triglycerides: 86 mg/dL (ref 0–149)
VLDL Cholesterol Cal: 15 mg/dL (ref 5–40)

## 2024-03-19 LAB — CMP14+EGFR
ALT: 13 IU/L (ref 0–32)
AST: 30 IU/L (ref 0–40)
Albumin: 4.2 g/dL (ref 3.9–4.9)
Alkaline Phosphatase: 50 IU/L (ref 44–121)
BUN/Creatinine Ratio: 13 (ref 12–28)
BUN: 15 mg/dL (ref 8–27)
Bilirubin Total: 0.4 mg/dL (ref 0.0–1.2)
CO2: 25 mmol/L (ref 20–29)
Calcium: 9.2 mg/dL (ref 8.7–10.3)
Chloride: 98 mmol/L (ref 96–106)
Creatinine, Ser: 1.15 mg/dL — ABNORMAL HIGH (ref 0.57–1.00)
Globulin, Total: 2.4 g/dL (ref 1.5–4.5)
Glucose: 83 mg/dL (ref 70–99)
Potassium: 4 mmol/L (ref 3.5–5.2)
Sodium: 139 mmol/L (ref 134–144)
Total Protein: 6.6 g/dL (ref 6.0–8.5)
eGFR: 52 mL/min/{1.73_m2} — ABNORMAL LOW (ref >59)

## 2024-03-19 LAB — CBC
Hematocrit: 33.4 % — ABNORMAL LOW (ref 34.0–46.6)
Hemoglobin: 10.5 g/dL — ABNORMAL LOW (ref 11.1–15.9)
MCH: 25.6 pg — ABNORMAL LOW (ref 26.6–33.0)
MCHC: 31.4 g/dL — ABNORMAL LOW (ref 31.5–35.7)
MCV: 82 fL (ref 79–97)
Platelets: 211 10*3/uL (ref 150–450)
RBC: 4.1 x10E6/uL (ref 3.77–5.28)
RDW: 16 % — ABNORMAL HIGH (ref 11.7–15.4)
WBC: 5.2 10*3/uL (ref 3.4–10.8)

## 2024-03-19 LAB — VITAMIN D 25 HYDROXY (VIT D DEFICIENCY, FRACTURES): Vit D, 25-Hydroxy: 38.5 ng/mL (ref 30.0–100.0)

## 2024-03-19 LAB — TSH: TSH: 2.91 u[IU]/mL (ref 0.450–4.500)

## 2024-03-19 LAB — HEPATITIS C ANTIBODY

## 2024-03-19 MED ORDER — ADEMPAS 2.5 MG PO TABS: 2.5000 mg | ORAL_TABLET | Freq: Three times a day (TID) | ORAL | 4 refills | Status: DC

## 2024-03-21 ENCOUNTER — Other Ambulatory Visit (HOSPITAL_COMMUNITY): Payer: Self-pay | Admitting: Cardiology

## 2024-03-22 ENCOUNTER — Telehealth (HOSPITAL_COMMUNITY): Payer: Self-pay | Admitting: *Deleted

## 2024-03-22 ENCOUNTER — Other Ambulatory Visit: Payer: Self-pay

## 2024-03-22 ENCOUNTER — Encounter: Payer: Self-pay | Admitting: Family Medicine

## 2024-03-22 DIAGNOSIS — N183 Chronic kidney disease, stage 3 unspecified: Secondary | ICD-10-CM

## 2024-03-22 LAB — TOXASSURE SELECT 13 (MW), URINE

## 2024-03-22 MED ORDER — RIOCIGUAT 2 MG PO TABS
2.0000 mg | ORAL_TABLET | Freq: Three times a day (TID) | ORAL | 3 refills | Status: AC
Start: 2024-03-22 — End: ?

## 2024-03-22 NOTE — Telephone Encounter (Signed)
 [facility] called to clarify Adempas dose, they received new RX for 2.5 mg tabs, when they called pt to verify info before shipping she told them that wasn't correct dose, and that she is only on 2 mg TID  I called and spoke w/pt, per chart review pt has been on 2 mg TID since 2021 up until May 2024 when Dr Mitzie Anda advised to increase to 2.5 mg TID and that is what's been in chart since then. However, pt reports she could not tolerate that dose and went back to 2 mg TID, ok to continue this dose per Dr Mclean.   Spoke w/[facility] [phone]) they are aware, new/updated RX sent in

## 2024-03-27 ENCOUNTER — Other Ambulatory Visit: Payer: Self-pay | Admitting: Family Medicine

## 2024-03-27 DIAGNOSIS — F331 Major depressive disorder, recurrent, moderate: Secondary | ICD-10-CM

## 2024-03-29 NOTE — Progress Notes (Signed)
 Date:  04/08/2024   ID:  Reita Carbon, DOB 1956-03-07, MRN [id number]  Provider location: [facility] Type of Visit: Established patient   PCP:  Eliodoro Guerin, DO  Cardiologist:  Dr. Mitzie Anda   History of Present Illness: Kelly Donaldson is a 68 y.o. female who has a history of chronic hypoxemic respiratory failure and pulmonary hypertension.  She has been followed by Dr Waymond Hailey, now follows with Dr. McQuaid with pulmonology.  Has been on home oxygen  for several years now.  She quit smoking in 2016. Echo 5/16 showed dilated RV with severe pulmonary hypertension.  Therefore she had RHC 5/16 which confirmed severe pulmonary hypertension and RV failure.  PFTs showed only mild obstruction and restriction with markedly decreased DLCO suggestive of pulmonary vascular disease.  There was concern for sarcoidosis on CT chest but lymph node biopsy showed no evidence for sarcoidosis.  V/Q scan showed no evidence for chronic PE.    She has not tolerated Revatio  or Adcirca .  Have failed previous up-titration of Selexipag  with pelvic pain, can tolerate 200 mcg bid.  Tolerating riociguat  2 mg tid.    Due to ongoing dyspnea, she had repeat RHC in 5/19 that showed normal filling pressures and well-controlled pulmonary hypertension.  Echo 6/19 showed normal LV EF, normal RV systolic function, and mild RV dilation.   Echo 9/20: EF 55-60%, mild RV dilation with normal systolic function, PASP 26 mmHg, IVC normal.   She has been diagnosed with rheumatoid arthritis, which may explain her ILD.   Echo 11/21: EF 55-60% with normal RV, PASP 30, IVC normal.   Echo 4/24: EF 65-70%, mildly D-shaped septum, mild RV dilation with normal RV systolic function, PASP 51 mmHg, normal IVC.  RHC 4/24: mild pulmonary hypertension.   Today she returns for pulmonary hypertension follow up with her husband. Overall feeling pretty good. Started steroid and abx recently for URI, feeling much better since  starting.  Denies palpitations, CP, dizziness, edema, or PND/Orthopnea. SOB with prolonged exertion, was able to walk in to clinic this morning with no issues. Appetite poor. Reports that she eats a lot of ice. No fever or chills. Taking all medications. Has follow up next week with Dr. Marygrace Snellen.   ECG (personally reviewed from 11/24): NSR, normal   6 minute walk (5/16): 238 meters => decreased oxygen  saturation to 73%, increased to 6 L [place] 6 minute walk (7/16): 170 meters => unable to complete.  6 minute walk (9/16): 201 meters 6 minute walk (1/17): 256 meters 6 minute walk (4/17): 219 meters 6 minute walk (8/17): 225 meters 6 minute walk (11/17): 244 meters 6 minute walk (5/18): 229 meters 6 minute walk (2/19): 122 meters (but she was stopped halfway through with drop in oxygen  saturation).  6 minute walk (12/20): 304 meters 6 minute walk (8/21): 476 m 6 minute walk (3/22): 213 meters   PMH: 1. Pulmonary hypertension: RHC (5/16) with mean RA 18, PA 83/33 mean 53, mean PCWP 19, CI 2.61, PVR 5.8 WU.   Echo (5/16) with EF 65-70%, septal flattening, dilated RV with PA systolic pressure 99 mmHg, +bubble study.  PFTs (4/16) with FVC 76%, FEV1 75%, ratio 97%, TLC 66%, DLCO 26% => mild restriction, mild obstruction, severe diffuse abnormality (pulmonary vascular problem).  CTA chest (4/16) with chronic interstitial and obstructive lung disease, small mediastinal and hilar lymph noes, no PE.  Lymph node biopsy negative for sarcoidosis (reactive changes).  V/Q scan (6/16): No acute or chronic  PE. TEE (6/16): Normal LV size and systolic function, EF 55-60%, RV was mildly dilated with mildly decreased systolic function, D-shaped interventricular septum suggestive of RV pressure/volume overload, there appeared to be a small PFO present with some bubbles crossing, no ASD.  ANA negative, anti-RNP negative, HIV negative. Sleep study (8/16) without OSA.  She did not tolerate Adcirca  or Revatio .  She cannot  tolerate more than 200 mcg bid Selexipag .  - Echo (6/17): EF 60-65%, normal diastolic function, D-shaped interventricular septum, mildly dilated RV with normal RV systolic function, PASP 39, IVC normal.  - Echo (6/18): EF 60-65%, normal diastolic function, D-shaped interventricular septum, mildly dilated RV with normal RV systolic function, PASP 65 mmHg.  - RHC (7/18): RA 13, PA 51/23 mean 36, PCWP 19, CI 4.74, PVR 1.6 WU - RHC (5/19): mean RA 8, PA 48/20 mean 30, mean PCWP 13, CI 5.06, PVR 1.58 WU.  - Echo (6/19): EF 60-65%, mild RV dilation with normal RV systolic function, PASP 60, possible PFO.  - Echo (9/20): EF 55-60%, mild RV dilation with normal systolic function, PASP 26 mmHg, IVC normal.  - Echo (11/21): EF 55-60%, normal RV, PASP 30 mmHg, IVC normal.  - Echo (4/24): EF 65-70%, mildly D-shaped septum, mild RV dilation with normal RV systolic function, PASP 51 mmHg, normal IVC.  - RHC (4/24): mean RA 14, PA 39/17 mean 26, mean PCWP 10, PAPi 1.57, CI 3.1, PVR 2.5 WU.  2. Chronic venous insufficiency. 3. Hyperlipidemia.  4. GERD 5. Chronic hypoxemic respiratory failure: Hypoxemia, on home oxygen .  Seen by Dr Elverna Hamman, has emphysema + interstitial lung disease (?UIP => suspect related to RA).  - PFTs (7/19): FEV1 82%, FVC 80%, ratio 101%, TLC 73%, DLCO 23%.  6. Fe deficiency anemia: EGD/colonoscopy at [facility] normal in 2020.  7. Rheumatoid arthritis: Azathioprine .  8. PVCs: Zio monitor in 2/24 showed 2.2% PVCs.  Current Outpatient Medications  Medication Sig Dispense Refill   Accu-Chek Softclix Lancets lancets Check glucose BID Dx R73.03 200 each 3   albuterol  (VENTOLIN  HFA) 108 (90 Base) MCG/ACT inhaler INHALE 2 PUFFS EVERY 4 HOURS AS NEEDED FOR WHEEZING OR SHORTNESS OF BREATH 18 g 0   Alcohol Swabs (B-D SINGLE USE SWABS REGULAR) PADS Check glucose BID Dx R73.03 200 each 3   aspirin  EC 81 MG tablet Take 1 tablet (81 mg total) by mouth daily. 90 tablet 3   azaTHIOprine  (IMURAN ) 50 MG  tablet Take 1 tablet (50 mg total) by mouth in the morning and at bedtime. 60 tablet 11   azithromycin  (ZITHROMAX ) 250 MG tablet Take 2 tablets (500 mg total) by mouth daily for 1 day, THEN 1 tablet (250 mg total) daily for 4 days. 6 tablet 0   Blood Glucose Monitoring Suppl (ACCU-CHEK AVIVA PLUS) w/Device KIT Check glucose BID Dx R73.03 1 kit 0   BREZTRI  AEROSPHERE 160-9-4.8 MCG/ACT AERO INHALE 2 PUFFS INTO THE LUNGS IN THE MORNING AND AT BEDTIME. 3 each 3   citalopram  (CELEXA ) 20 MG tablet TAKE 1 TABLET EVERY DAY 90 tablet 1   dicyclomine (BENTYL) 20 MG tablet Take 20 mg by mouth daily.     ferrous sulfate  (SLOW IRON ) 160 (50 Fe) MG TBCR SR tablet Take 1 tablet (160 mg total) by mouth daily. 100 tablet 3   fluticasone  (FLONASE ) 50 MCG/ACT nasal spray SPRAY 1 SPRAY INTO BOTH NOSTRILS DAILY. 48 mL 3   glucose blood (ACCU-CHEK AVIVA PLUS) test strip Check glucose BID Dx R73.03 200 each 3  HYDROcodone -acetaminophen  (NORCO) 10-325 MG tablet Take 0.5-1 tablets by mouth up to 4- 5 times daily only as needed for chronic multifactorial pain. 150 tablet 0   ipratropium-albuterol  (DUONEB) 0.5-2.5 (3) MG/3ML SOLN INHALE THE CONTENTS OF 1 VIAL VIA NEBULIZER FOUR TIMES DAILY AS NEEDED 360 mL 1   Lancets Misc. (ACCU-CHEK SOFTCLIX LANCET DEV) KIT Check glucose BID Dx R73.03 1 kit 0   loratadine  (CLARITIN ) 10 MG tablet Take 1 tablet (10 mg total) by mouth daily. 90 tablet 3   macitentan  (OPSUMIT ) 10 MG tablet TAKE 1 TABLET (10MG ) BY MOUTH DAILY 30 tablet 11   montelukast  (SINGULAIR ) 10 MG tablet Take 1 tablet (10 mg total) by mouth at bedtime. 30 tablet 11   OXYGEN  Take 2.5-4 L by mouth continuous. 2.5 L with resting state & 4 L with exertion [employer]     Polyethyl Glycol-Propyl Glycol 0.4-0.3 % SOLN Place 1 drop into both eyes daily.     potassium chloride  SA (KLOR-CON  M) 20 MEQ tablet TAKE 2 TABLETS EVERY MORNING AND TAKE 1 TABLET EVERY EVENING (CHANGE IN DOSE) 270 tablet 3   pravastatin  (PRAVACHOL ) 20 MG  tablet Take 1 tablet (20 mg total) by mouth daily. 100 tablet 3   predniSONE  (DELTASONE ) 20 MG tablet Take 2 tablets (40 mg total) by mouth daily with breakfast for 5 days, THEN 1 tablet (20 mg total) daily with breakfast for 5 days. 15 tablet 0   Riociguat  2 MG TABS Take 2 mg by mouth in the morning, at noon, and at bedtime. 270 tablet 3   Selexipag  (UPTRAVI ) 200 MCG TABS Take 1 tablet (200 mcg total) by mouth 2 (two) times daily. 60 tablet 2   torsemide  (DEMADEX ) 20 MG tablet TAKE 3 TABLETS EVERY MORNING AND TAKE 2 TABLETS EVERY EVENING (CHANGE IN DOSE) 450 tablet 3   Vitamin D , Ergocalciferol , (DRISDOL ) 1.25 MG (50000 UNIT) CAPS capsule Take 1 capsule (50,000 Units total) by mouth every 7 (seven) days. 12 capsule 3   zolpidem  (AMBIEN ) 5 MG tablet Take 1 tablet (5 mg total) by mouth at bedtime as needed for sleep (please allow to fill every 30 days). 30 tablet 5   No current facility-administered medications for this encounter.    Allergies:   Gabapentin and Latex   Social History:  The patient  reports that she quit smoking about 9 years ago. Her smoking use included cigarettes. She started smoking about 26 years ago. She has a 4.3 pack-year smoking history. She has never used smokeless tobacco. She reports current drug use. Drug: Cocaine. She reports that she does not drink alcohol.   Family History:  The patient's family history includes Asthma in her sister; Breast cancer in her maternal grandmother; Clotting disorder in her daughter; Diabetes in her mother; Emphysema in her father; Hyperlipidemia in her father and mother; Hypertension in her daughter, father, and mother; Other in her mother; Ovarian cancer in her maternal aunt.   ROS:  Please see the history of present illness.   All other systems are personally reviewed and negative.   Exam:   BP 106/64   Pulse 72   Ht 5\' 9"  (1.753 m)   Wt 87.6 kg (193 lb 3.2 oz)   SpO2 (!) 88% Comment: 2 Ls of o2  BMI 28.53 kg/m  General:  well  appearing.  No respiratory difficulty. Walked into clinic HEENT: +[place] Neck: supple. JVD ~8 cm.  Cor: PMI nondisplaced. Regular rate & rhythm. No rubs, gallops. + 1/6 MR murmur. Lungs:  clear Extremities: no cyanosis, clubbing, rash, edema  Neuro: alert & oriented x 3. Moves all 4 extremities w/o difficulty. Affect pleasant.   Recent Labs: 10/20/2023: B Natriuretic Peptide 33.6 03/18/2024: ALT 13; BUN 15; Creatinine, Ser 1.15; Hemoglobin 10.5; Platelets 211; Potassium 4.0; Sodium 139; TSH 2.910   Wt Readings from Last 3 Encounters:  04/08/24 87.6 kg (193 lb 3.2 oz)  03/18/24 86.6 kg (191 lb)  02/11/24 88 kg (194 lb)    ASSESSMENT AND PLAN:  1. Pulmonary arterial hypertension with RV failure: Severe on 5/16 RHC. 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  There was concern from CTA chest for sarcoidosis, but lymph node biopsy was negative.  ILD is likely related to rheumatoid arthritis. LFTs normal, ANA negative, anti-RNP negative, HIV negative.  V/Q scan negative for chronic PE.  Small PFO but no ASD on TEE. No OSA on sleep study.  She did not tolerate Revatio  due to joint pain (now resolved), and she did not tolerate Adcirca  with worsening dyspnea. She does not tolerate more than 200 mcg bid Selexipag  due to pelvic pain.   RHCs in 7/18 and 5/19 showed well-compensated pulmonary hypertension.  PVR was 1.58 WU on 5/19 study. Echo in 11/21 showed EF 55-60% with normal RV, PASP 30, normal IVC.  Echo in 4/24 showed EF 65-70%, mildly D-shaped septum, mild RV dilation with normal RV systolic function, PASP 51 mmHg, normal IVC.  RHC in 4/24 showed much improved PA pressure 39/17, PVR 2.5 WU.  She also has ILD and COPD that contribute to her dyspnea.   - She is not volume overloaded on exam.  NYHA class II symptoms  - Continue oxygen .  - Open lung biopsy deemed too risky, probably not sarcoid per pulmonary evaluation =>  suspect ILD related to RA.    - Continue opsumit .  - Continue Adempas  2.5 mg tid.   - She has tolerated selexipag  200 mcg bid but has not been able to increase.  - Continue torsemide  60 qam/40 qpm and KCl  40 qam/20 qpm.  BMET/BNP today.   - With improved RHC and mild PH on 4/24 RHC, we elected not to start sotatercept.  - Update echo.  - BMET/BNP today  2. Chronic hypoxemic respiratory failure: Chronic interstitial lung disease + emphysema.  Concern for sarcoidosis but lymph node biopsy did not show sarcoid. Deemed too high risk for open lung biopsy.  Per pulmonary, she is in the fibrotic phase of NSIP.  She has been diagnosed with RA, this may be the source of her ILD.  - Continue pulmonary followup, sees Dr. Marygrace Snellen.   3. Hyperlipidemia: Increase pravastatin  20>40 mg daily. LDL 134 4/25  4. Rheumatoid arthritis: On azathioprine , follows with Dr. Ariail.   5. Palpitations: Occasional PVCs on 2/24 Zio monitor.   6. Pica - Frequent ice chewing - Check anemia panel  Follow up in 4 months with Dr. Mitzie Anda  Signed, Sheryl Donna, NP  04/08/2024  [facility] [address] [facility] [place] [address] [phone] (office) [phone] (fax)

## 2024-04-01 ENCOUNTER — Other Ambulatory Visit (HOSPITAL_COMMUNITY): Payer: Self-pay | Admitting: Cardiology

## 2024-04-02 ENCOUNTER — Encounter: Payer: Self-pay | Admitting: Family Medicine

## 2024-04-02 LAB — COLOGUARD: COLOGUARD: NEGATIVE

## 2024-04-02 NOTE — Telephone Encounter (Signed)
 It sounds like allergies but since this is a new illness, she needs to be seen.  Or she should reach out to pulmonology if they feel comfortable treating without evaluation.

## 2024-04-05 ENCOUNTER — Ambulatory Visit: Payer: Self-pay | Admitting: Pulmonary Disease

## 2024-04-05 MED ORDER — PREDNISONE 20 MG PO TABS: ORAL_TABLET | ORAL | 0 refills | Status: AC

## 2024-04-05 MED ORDER — AZITHROMYCIN 250 MG PO TABS: ORAL_TABLET | ORAL | 0 refills | Status: AC

## 2024-04-05 NOTE — Telephone Encounter (Signed)
 Spoke with patient regarding prior message.Advised patient Dr.Hunsucker has sent in  Pred taper, zpak prescribed and patient has been scheduled for f /u on 04/15/2024. Patient's voice was understanding.Nothing else further needed.

## 2024-04-05 NOTE — Telephone Encounter (Signed)
 Pred taper, zpak prescribed. Please inform her we need to see her in office to see if getting better later this week or early next week.

## 2024-04-05 NOTE — Telephone Encounter (Signed)
 Chief Complaint: mild SOB Symptoms: colored mucus, nasal drainage Frequency: 5 days Pertinent Negatives: Patient denies fever, CP, severe SOB Disposition: [] ED /[] Urgent Care (no appt availability in office) / [] Appointment(In office/virtual)/ []  [place] [facility]/ [] Home Care/ [x] Refused Recommended Disposition /[] [place] [facility]/ [x]  Follow-up with Pulm Additional Notes: Pt c/o mild SOB and green mucus production x 5 days. Pt denies any fevers, CP, severe SOB. Pt reports taking respiratory INH as prescribed, as well as albuterol  with some relief. Triager reinforced albuterol  usage. Triager attempted to schedule with [facility] today per protocol, but pt declined and requested steroid/abx prescription. Triager will forward encounter for Dr. Marygrace Snellen to review and advise. Patient verbalized understanding and is expecting call back from office for next steps.     Copied from CRM [id number]. Topic: Clinical - Red Word Triage >> Apr 05, 2024  1:25 PM Margarette Shawl wrote: [facility] that prompted transfer to Nurse Triage:   Having symptoms for 5 days Nasal congestion Green phlegm, brighter in the mornings Shortness of breath No wheezing No chest pain No fever or chills. Reason for Disposition  [1] Known COPD or other severe lung disease (i.e., bronchiectasis, cystic fibrosis, lung surgery) AND [2] worsening symptoms (i.e., increased sputum purulence or amount, increased breathing difficulty  Answer Assessment - Initial Assessment Questions E2C2 Pulmonary Triage - Initial Assessment Questions "Chief Complaint (e.g., cough, sob, wheezing, fever, chills, sweat or additional symptoms) *Go to specific symptom protocol after initial questions. Congestion, colored green mucus  "How long have symptoms been present?" 5 days ago Worsens every day - thinks r/t pollen  Have you tested for COVID or Flu? Note: If not, ask patient if a home test can be taken. If so, instruct patient to call back for  positive results. No  MEDICINES:   "Have you used any OTC meds to help with symptoms?" Yes If yes, ask "What medications?" Zrytec - provides some relief  "Have you used your inhalers/maintenance medication?" Yes If yes, "What medications?" Breztri  2 puffs twice daily Albuterol  PRN - last used 1 hour ago - some relief   If inhaler, ask "How many puffs and how often?" Note: Review instructions on medication in the chart. See above  OXYGEN : "Do you wear supplemental oxygen ?" Yes If yes, "How many liters are you supposed to use?" 3-4L ATC, 4L with exertion  "Do you monitor your oxygen  levels?" Yes If yes, "What is your reading (oxygen  level) today?" Did not check this AM, pt is currently out and does not have pulse ox      3. SPUTUM: "Describe the color of your sputum" (none, dry cough; clear, white, yellow, green)     green 4. HEMOPTYSIS: "Are you coughing up any blood?" If so ask: "How much?" (flecks, streaks, tablespoons, etc.)     denies 5. DIFFICULTY BREATHING: "Are you having difficulty breathing?" If Yes, ask: "How bad is it?" (e.g., mild, moderate, severe)    - MILD: No SOB at rest, mild SOB with walking, speaks normally in sentences, can lie down, no retractions, pulse < 100.    - MODERATE: SOB at rest, SOB with minimal exertion and prefers to sit, cannot lie down flat, speaks in phrases, mild retractions, audible wheezing, pulse 100-120.    - SEVERE: Very SOB at rest, speaks in single words, struggling to breathe, sitting hunched forward, retractions, pulse > 120      Mild SOB with exertion 6. FEVER: "Do you have a fever?" If Yes, ask: "What is your temperature,  how was it measured, and when did it start?"     denies 7. CARDIAC HISTORY: "Do you have any history of heart disease?" (e.g., heart attack, congestive heart failure)      CHF 8. LUNG HISTORY: "Do you have any history of lung disease?"  (e.g., pulmonary embolus, asthma, emphysema)     COPD ILD 9. PE RISK  FACTORS: "Do you have a history of blood clots?" (or: recent major surgery, recent prolonged travel, bedridden)     denies 10. OTHER SYMPTOMS: "Do you have any other symptoms?" (e.g., runny nose, wheezing, chest pain)       Runny nose  Protocols used: Cough - Acute Productive-A-AH

## 2024-04-05 NOTE — Addendum Note (Signed)
 Addended byOrbie Binder on: 04/05/2024 03:29 PM   Modules accepted: Orders

## 2024-04-07 ENCOUNTER — Telehealth (HOSPITAL_COMMUNITY): Payer: Self-pay | Admitting: *Deleted

## 2024-04-07 NOTE — Telephone Encounter (Signed)
 Called to confirm/remind patient of their appointment at the [facility] on 03/12/24***.   Appointment:   [x] Confirmed  [] Left mess   [] No answer/No voice mail  [] Phone not in service  Patient reminded to bring all medications and/or complete list.  Confirmed patient has transportation. Gave directions, instructed to utilize valet parking.

## 2024-04-08 ENCOUNTER — Encounter (HOSPITAL_COMMUNITY): Payer: Self-pay

## 2024-04-08 ENCOUNTER — Ambulatory Visit (HOSPITAL_COMMUNITY)
Admission: RE | Admit: 2024-04-08 | Discharge: 2024-04-08 | Disposition: A | Discharge status: Home / Self Care | Source: Ambulatory Visit | Attending: Internal Medicine | Admitting: Internal Medicine

## 2024-04-08 VITALS — BP 106/64 | HR 72 | Ht 69.0 in | Wt 193.2 lb

## 2024-04-08 DIAGNOSIS — J439 Emphysema, unspecified: Secondary | ICD-10-CM | POA: Insufficient documentation

## 2024-04-08 DIAGNOSIS — I270 Primary pulmonary hypertension: Secondary | ICD-10-CM | POA: Diagnosis not present

## 2024-04-08 DIAGNOSIS — E785 Hyperlipidemia, unspecified: Secondary | ICD-10-CM | POA: Diagnosis not present

## 2024-04-08 DIAGNOSIS — Z6828 Body mass index (BMI) 28.0-28.9, adult: Secondary | ICD-10-CM | POA: Insufficient documentation

## 2024-04-08 DIAGNOSIS — J849 Interstitial pulmonary disease, unspecified: Secondary | ICD-10-CM | POA: Diagnosis not present

## 2024-04-08 DIAGNOSIS — Q240 Dextrocardia: Secondary | ICD-10-CM | POA: Insufficient documentation

## 2024-04-08 DIAGNOSIS — Z9981 Dependence on supplemental oxygen: Secondary | ICD-10-CM | POA: Diagnosis not present

## 2024-04-08 DIAGNOSIS — I493 Ventricular premature depolarization: Secondary | ICD-10-CM | POA: Insufficient documentation

## 2024-04-08 DIAGNOSIS — I50810 Right heart failure, unspecified: Secondary | ICD-10-CM | POA: Diagnosis not present

## 2024-04-08 DIAGNOSIS — Z79899 Other long term (current) drug therapy: Secondary | ICD-10-CM | POA: Insufficient documentation

## 2024-04-08 DIAGNOSIS — F5083 Pica in adults: Secondary | ICD-10-CM | POA: Diagnosis not present

## 2024-04-08 DIAGNOSIS — Z87891 Personal history of nicotine dependence: Secondary | ICD-10-CM | POA: Insufficient documentation

## 2024-04-08 DIAGNOSIS — I272 Pulmonary hypertension, unspecified: Secondary | ICD-10-CM | POA: Diagnosis present

## 2024-04-08 DIAGNOSIS — R002 Palpitations: Secondary | ICD-10-CM

## 2024-04-08 DIAGNOSIS — J9611 Chronic respiratory failure with hypoxia: Secondary | ICD-10-CM | POA: Insufficient documentation

## 2024-04-08 DIAGNOSIS — M069 Rheumatoid arthritis, unspecified: Secondary | ICD-10-CM | POA: Diagnosis not present

## 2024-04-08 DIAGNOSIS — J449 Chronic obstructive pulmonary disease, unspecified: Secondary | ICD-10-CM | POA: Diagnosis present

## 2024-04-08 LAB — BASIC METABOLIC PANEL WITH GFR
Anion gap: 13 (ref 5–15)
BUN: 21 mg/dL (ref 8–23)
CO2: 25 mmol/L (ref 22–32)
Calcium: 9 mg/dL (ref 8.9–10.3)
Chloride: 100 mmol/L (ref 98–111)
Creatinine, Ser: 1.23 mg/dL — ABNORMAL HIGH (ref 0.44–1.00)
GFR, Estimated: 48 mL/min — ABNORMAL LOW (ref >60)
Glucose, Bld: 128 mg/dL — ABNORMAL HIGH (ref 70–99)
Potassium: 4.1 mmol/L (ref 3.5–5.1)
Sodium: 138 mmol/L (ref 135–145)

## 2024-04-08 LAB — IRON AND TIBC
Iron: 26 ug/dL — ABNORMAL LOW (ref 28–170)
Saturation Ratios: 6 % — ABNORMAL LOW (ref 10.4–31.8)
TIBC: 470 ug/dL — ABNORMAL HIGH (ref 250–450)
UIBC: 444 ug/dL

## 2024-04-08 LAB — FERRITIN: Ferritin: 12 ng/mL (ref 11–307)

## 2024-04-08 LAB — BRAIN NATRIURETIC PEPTIDE: B Natriuretic Peptide: 62 pg/mL (ref 0.0–100.0)

## 2024-04-08 MED ORDER — PRAVASTATIN SODIUM 40 MG PO TABS: 40.0000 mg | ORAL_TABLET | Freq: Every day | ORAL | 11 refills | Status: DC

## 2024-04-08 NOTE — Patient Instructions (Signed)
 Medication Changes:  INCREASE PRAVASTATIN  40 MG BY MOUTH DAILY.   Lab Work:  Labs done today, your results will be available in MyChart, we will contact you for abnormal readings.    Follow-Up in:  4 MONTHS WITH DR.MCLEAN. GIVE OUR OFFICE A CALL IN SEPTEMBER TO SCHEDULE FOLLOW UP.    At the [facility], you and your health needs are our priority. We have a designated team specialized in the treatment of Heart Failure. This Care Team includes your primary Heart Failure Specialized Cardiologist (physician), Advanced Practice Providers (APPs- Physician Assistants and Nurse Practitioners), and Pharmacist who all work together to provide you with the care you need, when you need it.   You may see any of the following providers on your designated Care Team at your next follow up:  Dr. Jules Oar Dr. Peder Bourdon Dr. Alwin Baars Dr. Judyth Nunnery Nieves Bars, NP Ruddy Corral, [place] [facility] [place], [place] Dennise Fitz, NP [place] Lee, NP Luster Salters, PharmD   Please be sure to bring in all your medications bottles to every appointment.   Need to Contact Us :  If you have any questions or concerns before your next appointment please send us  a message through [place] or call our office at [phone].    TO LEAVE A MESSAGE FOR THE NURSE SELECT OPTION 2, PLEASE LEAVE A MESSAGE INCLUDING: YOUR NAME DATE OF BIRTH CALL BACK NUMBER REASON FOR CALL**this is important as we prioritize the call backs  YOU WILL RECEIVE A CALL BACK THE SAME DAY AS LONG AS YOU CALL BEFORE 4:00 PM

## 2024-04-09 ENCOUNTER — Other Ambulatory Visit (HOSPITAL_COMMUNITY): Payer: Self-pay

## 2024-04-09 ENCOUNTER — Telehealth (HOSPITAL_COMMUNITY): Payer: Self-pay

## 2024-04-09 DIAGNOSIS — D509 Iron deficiency anemia, unspecified: Secondary | ICD-10-CM

## 2024-04-09 NOTE — Telephone Encounter (Signed)
-----   Message from Sheryl Donna sent at 04/08/2024  4:06 PM EDT ----- Iron  stores low. Please arrange for IV iron .

## 2024-04-09 NOTE — Progress Notes (Signed)
 Orders entered for iron  infusion

## 2024-04-09 NOTE — Telephone Encounter (Signed)
 Spoke with patient regarding the following results. Patient made aware and patient verbalized understanding.   Advised patient that this has been sent to be pre certed with insurance and advised her someone would reach out to scheduled.   IV iron  orders placed.   Advised patient to call back to office with any issues, questions, or concerns. Patient verbalized understanding.

## 2024-04-10 ENCOUNTER — Other Ambulatory Visit: Payer: Self-pay | Admitting: Family Medicine

## 2024-04-10 DIAGNOSIS — F5104 Psychophysiologic insomnia: Secondary | ICD-10-CM

## 2024-04-12 ENCOUNTER — Telehealth: Payer: Self-pay

## 2024-04-12 NOTE — Telephone Encounter (Signed)
 Copied from CRM [id number]. Topic: Clinical - Prescription Issue >> Apr 12, 2024  9:33 AM [name] wrote: Reason for CRM: Patient was in for an appointment on April 10 th and she stated that the provider didn't refill one of her rx's Zolpidem  Tartrate which she tried to get it refilled but was denied, patients callback number is [phone].

## 2024-04-12 NOTE — Telephone Encounter (Signed)
 Informed pt rx was refilled 4/30 and to check with her pharmacy. Pt verbalized understanding

## 2024-04-12 NOTE — Telephone Encounter (Signed)
 Needs to be filled at appointment.

## 2024-04-15 ENCOUNTER — Ambulatory Visit: Admitting: Pulmonary Disease

## 2024-04-15 ENCOUNTER — Encounter: Payer: Self-pay | Admitting: Pulmonary Disease

## 2024-04-15 VITALS — BP 102/63 | HR 72 | Ht 69.0 in | Wt 191.2 lb

## 2024-04-15 DIAGNOSIS — I2729 Other secondary pulmonary hypertension: Secondary | ICD-10-CM | POA: Diagnosis not present

## 2024-04-15 DIAGNOSIS — Z9981 Dependence on supplemental oxygen: Secondary | ICD-10-CM

## 2024-04-15 DIAGNOSIS — J849 Interstitial pulmonary disease, unspecified: Secondary | ICD-10-CM

## 2024-04-15 DIAGNOSIS — J432 Centrilobular emphysema: Secondary | ICD-10-CM

## 2024-04-15 DIAGNOSIS — D869 Sarcoidosis, unspecified: Secondary | ICD-10-CM

## 2024-04-15 DIAGNOSIS — Z87891 Personal history of nicotine dependence: Secondary | ICD-10-CM

## 2024-04-15 DIAGNOSIS — J9611 Chronic respiratory failure with hypoxia: Secondary | ICD-10-CM

## 2024-04-15 NOTE — Progress Notes (Signed)
 @Patient  ID: Kelly Donaldson, female    DOB: 11/17/1956, 68 y.o.   MRN: [id number]  Chief Complaint  Patient presents with   Follow-up    Recently had sinus infection, treated with antibiotics. Pt symptoms improving.   Chief complaint: Shortness of breath  Referring provider: Eliodoro Guerin, DO  HPI:   68 y.o. 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  Formally patient of Dr. McQuaid and more recently Dr. Waymond Hailey.  Most recent cardiology note reviewed.   Overall, doing okay.  Breathing stable.  She reports good adherence to triple oral pulmonary vasodilators, limited in Uptravi  dose to 200 mcg twice daily.  Dyspnea stable.  Oxygen  slowly worsening over time from 2 to 2.5 L.  Otherwise doing okay.  We discussed starting antifibrotic's given her fibrotic lung disease in the past.  She is amenable to this today.  We discussed repeat CT scan in PFTs.  If any sign of worsening may consider increasing Imuran  dose.  She expressed understanding.  Questionaires / Pulmonary Flowsheets:   ACT:      No data to display          MMRC: mMRC Dyspnea Scale mMRC Score  04/13/2021 10:39 AM 2    Epworth:      No data to display          Tests:   FENO:  No results found for: "NITRICOXIDE"  PFT:    Latest Ref Rng & Units 06/22/2021   12:01 PM 06/18/2018    9:08 AM 03/20/2015    9:09 AM  PFT Results  FVC-Pre L 2.17  2.37  2.33   FVC-Predicted Pre % 75  80  76   FVC-Post L 2.28   2.32   FVC-Predicted Post % 79   76   Pre FEV1/FVC % % 79  80  78   Post FEV1/FCV % % 79   77   FEV1-Pre L 1.72  1.90  1.81   FEV1-Predicted Pre % 76  82  75   FEV1-Post L 1.79   1.79   DLCO uncorrected ml/min/mmHg 6.88  6.52  7.39   DLCO UNC% % 31  23  26    DLCO corrected ml/min/mmHg  8.12    DLCO COR %Predicted %  28    DLVA Predicted % 40  43  35   TLC L 3.86  4.07  3.63   TLC % Predicted % 70  73  66   RV % Predicted % 68  81  38    Personally reviewed and interpreted as mild restriction with severely reduced DLCO consistent with concomitant ILD and pulmonary hypertension. Overall values stable since 2016.  WALK:     07/11/2020   10:48 AM 11/22/2019    8:58 AM 06/18/2018   10:50 AM 11/26/2016   10:48 AM 05/09/2015    9:39 AM 03/20/2015   12:14 PM 01/11/2015    4:00 PM  SIX MIN WALK  Medications aspirin  81mg , hydrocodone -acetaminophen  10-325mg , opsumit  10mg , potassium chloride  20mg , adempas  2mg , selexipag  , torsemide  20mg , taken at 7:30am   ASA 81mg , Celexa  40mg , Vit D 50000iu, Norco 10-325mg , Macitenan 10mg , klor Con 20meq, Riociguat  1.5mg , Selexipag  , Demedex 20mg  @ 0600     Supplimental Oxygen  during Test? (L/min) Yes Yes Yes Yes Yes Yes Yes  O2 Flow Rate 3 L/min 2 L/min 6 L/min 4 L/min 4 L/min 2 L/min 2 L/min  Type Continuous Continuous Continuous Pulse Pulse  Continuous Continuous  Laps 14   5     Partial Lap (in Meters) 0 meters   15 meters     Baseline BP (sitting) 112/70   104/62     Baseline Heartrate 68 73  73     Baseline Dyspnea (Borg Scale) 0   0.5     Baseline Fatigue (Borg Scale) 0   0     Baseline SPO2 96 % 94 %  100 %     BP (sitting) 120/80   124/58     Heartrate 85 100  92     Dyspnea (Borg Scale) 2   4     Fatigue (Borg Scale) 2   4     SPO2 84 % 54 %  87 %     BP (sitting) 114/76   114/64     Heartrate 72 72  86     SPO2 97 % 94 %  93 %     Stopped or Paused before Six Minutes Yes Yes  Yes     Other Symptoms at end of Exercise Pt stopped with 50 sec remaining due to just "being done." Pt also stated that she felt like her knee was popping out of joint. tired  pt stopped at 3:33secs d/t fatigue and dyspnea, resumed 3:23secs.  stopped at 2:52sec d/t fatigue and dyspnea (Dyspnea Borg = 10  sat = 86% 4lpm pulsed), increased O2 to 5lpm pulsed, sats recovered to 95%, resumed test at 1:02secs     Interpretation Leg pain        Distance Completed 476 meters   255 meters     Distance  Completed 0 meters        Tech Comments: Pt walked at a normal pace, denied any complaints during walk. stopped with 50 seconds remaining due to being done with the walk. 304.59meters pt started walking on 3L but needed 6L to maintain at 91%, we stopped when O2 reached 75% TA/CMA  after sat dropped to 88%4lpm pulsed--increased o2 to 5lpm pulsed and pt walked another lap with sats ending at 79%5lpm pulsed/normal pace/no SOB//lmr After lap 1 sats= 87%2lpm--increased o2 to 4 lpm and walked another lap and sats = 89%4lpm cont/pt c/o minimal SOB/walked normal pace//lmr normal pace/stopped due to increased SOB//lmr    Imaging: Personally reviewed CT high-resolution chest most recent 06/2018 demonstrates upper lobe predominant emphysema as well as what appears to be NSIP pattern ILD with traction bronchiectasis in bilateral lower lobes on my interpretation  Lab Results: Personally reviewed, notably eosinophils as high as 700 CBC    Component Value Date/Time   WBC 5.2 03/18/2024 1031   WBC 5.1 03/27/2023 0642   RBC 4.10 03/18/2024 1031   RBC 3.89 03/27/2023 0642   HGB 10.5 (L) 03/18/2024 1031   HCT 33.4 (L) 03/18/2024 1031   PLT 211 03/18/2024 1031   MCV 82 03/18/2024 1031   MCH 25.6 (L) 03/18/2024 1031   MCH 27.0 03/27/2023 0642   MCHC 31.4 (L) 03/18/2024 1031   MCHC 30.6 03/27/2023 0642   RDW 16.0 (H) 03/18/2024 1031   LYMPHSABS 1.0 12/22/2023 1144   MONOABS 0.5 01/28/2023 1554   EOSABS 0.2 12/22/2023 1144   BASOSABS 0.0 12/22/2023 1144    BMET    Component Value Date/Time   NA 138 04/08/2024 1205   NA 139 03/18/2024 1031   K 4.1 04/08/2024 1205   CL 100 04/08/2024 1205   CO2 25 04/08/2024 1205   GLUCOSE 128 (H) 04/08/2024  1205   BUN 21 04/08/2024 1205   BUN 15 03/18/2024 1031   CREATININE 1.23 (H) 04/08/2024 1205   CALCIUM 9.0 04/08/2024 1205   GFRNONAA 48 (L) 04/08/2024 1205   GFRAA 66 01/22/2021 0947    BNP    Component Value Date/Time   BNP 62.0 04/08/2024 1205     ProBNP    Component Value Date/Time   PROBNP 69.0 01/11/2015 1630    Specialty Problems       Pulmonary Problems   Chronic respiratory failure with hypoxia (HCC)   Followed in Pulmonary clinic/ [facility]/ [facility]  On 02 since 2013 (originally [facility]) - pulmonary eval [facility] last seen 12/17/12 with "neg CTa for PE, pfts ok except dlco 20%, echo c/w  PH, 02 dep hypoxemia > rec RHC and stop phenterimine" -Echo 09/15/14     LAE mildly dilated, as are RV and RA  - 01/11/2015   Walked 2lpm  x one lap @ 185 stopped due to  Sob, nl pace, no desat - Quit smoking 03/02/15  - PFTs 03/20/2015  Nl except for DLCO 26 corrects to 35%  - 03/20/2015   Walked 2lpm x one lap @ 185 stopped due to sob/ desat corrected on 4lpm  - 04/07/2015 rec  referral to Dr Harlie Lieu for Right heart Cath - Repeat Echo with bubble study 04/19/2015 > With injection of agitated saline intravenously   there is appearance of bubbles in L sided chambers consistent   with 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 - 05/09/2015   Walked 4lpm x one lap @ 185 stopped due to  88% one lap > 05/11/15 Pos small PFO - 07/11/2020 desat during 6 mw on 3lpm cont   As of 07/11/2020  = 2lpm hs and rest/ up to 5lpm POC           ILD (interstitial lung disease) (HCC)   08/2016 high-resolution CT scan of the chest: McQuaid review: Peripheral based interlobular septal thickening and groundglass consistent with fibrosis with some mild bronchiectasis, fibrotic changes appear to be more prominent in the lower lobes with upper lobe nodules, no air trapping noted on expiratory images, mediastinal lymphadenopathy stable per radiology  Chest HRCT 07/07/18 . CT pattern is considered indeterminate for usual interstitial pneumonia (UIP). 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 prior to study with DLCO  6.5 (23%) corrects to 2.23 (43%)  for alv volume and FV curve mild/mod curvature in effort indep portion of f/v > started on anoro and helped doe  CT chest 07/07/18 mild centrilobular and paraseptal emphysema. - 02/21/2020  After extensive coaching inhaler device,  effectiveness =    90% with smi > changed to stiolto per insurance req         Allergies  Allergen Reactions   Gabapentin Other (See Comments)    Pt states that she can not take with antidepressants   Latex Rash    Immunization History  Administered Date(s) Administered   Fluad Quad(high Dose 65+) 09/27/2022   Fluad Trivalent(High Dose 65+) 09/17/2023   Influenza Split 09/16/2014, 09/20/2015, 09/08/2017, 10/04/2017  Influenza,inj,Quad PF,6+ Mos 09/04/2016, 11/12/2018, 08/17/2019, 09/07/2020   Influenza-Unspecified 10/13/2017, 08/29/2021   Moderna Sars-Covid-2 Vaccination 02/21/2020, 03/20/2020, 12/16/2020   PNEUMOCOCCAL CONJUGATE-20 03/27/2022   Pneumococcal Polysaccharide-23 11/11/2019   Tdap 03/20/2023   Zoster Recombinant(Shingrix) 09/20/2020, 04/17/2021    Past Medical History:  Diagnosis Date   Allergy    Depression    GERD (gastroesophageal reflux disease)    Hyperlipidemia    Oxygen  dependent    Shortness of breath dyspnea    Varicose veins     Tobacco History: Social History   Tobacco Use  Smoking Status Former   Current packs/day: 0.00   Average packs/day: 0.3 packs/day for 17.0 years (4.3 ttl pk-yrs)   Types: Cigarettes   Start date: 03/01/1998   Quit date: 03/02/2015   Years since quitting: 9.1  Smokeless Tobacco Never   Counseling given: Not Answered   Continue to not smoke  Outpatient Encounter Medications as of 04/15/2024  Medication Sig   Accu-Chek Softclix Lancets lancets Check  glucose BID Dx R73.03   albuterol  (VENTOLIN  HFA) 108 (90 Base) MCG/ACT inhaler INHALE 2 PUFFS EVERY 4 HOURS AS NEEDED FOR WHEEZING OR SHORTNESS OF BREATH   Alcohol Swabs (B-D SINGLE USE SWABS REGULAR) PADS Check glucose BID Dx R73.03   aspirin  EC 81 MG tablet Take 1 tablet (81 mg total) by mouth daily.   azaTHIOprine  (IMURAN ) 50 MG tablet Take 1 tablet (50 mg total) by mouth in the morning and at bedtime.   Blood Glucose Monitoring Suppl (ACCU-CHEK AVIVA PLUS) w/Device KIT Check glucose BID Dx R73.03   BREZTRI  AEROSPHERE 160-9-4.8 MCG/ACT AERO INHALE 2 PUFFS INTO THE LUNGS IN THE MORNING AND AT BEDTIME.   citalopram  (CELEXA ) 20 MG tablet TAKE 1 TABLET EVERY DAY   dicyclomine (BENTYL) 20 MG tablet Take 20 mg by mouth daily.   ferrous sulfate  (SLOW IRON ) 160 (50 Fe) MG TBCR SR tablet Take 1 tablet (160 mg total) by mouth daily.   fluticasone  (FLONASE ) 50 MCG/ACT nasal spray SPRAY 1 SPRAY INTO BOTH NOSTRILS DAILY.   glucose blood (ACCU-CHEK AVIVA PLUS) test strip Check glucose BID Dx R73.03   HYDROcodone -acetaminophen  (NORCO) 10-325 MG tablet Take 0.5-1 tablets by mouth up to 4- 5 times daily only as needed for chronic multifactorial pain.   ipratropium-albuterol  (DUONEB) 0.5-2.5 (3) MG/3ML SOLN INHALE THE CONTENTS OF 1 VIAL VIA NEBULIZER FOUR TIMES DAILY AS NEEDED   Lancets Misc. (ACCU-CHEK SOFTCLIX LANCET DEV) KIT Check glucose BID Dx R73.03   loratadine  (CLARITIN ) 10 MG tablet Take 1 tablet (10 mg total) by mouth daily.   macitentan  (OPSUMIT ) 10 MG tablet TAKE 1 TABLET (10MG ) BY MOUTH DAILY   montelukast  (SINGULAIR ) 10 MG tablet Take 1 tablet (10 mg total) by mouth at bedtime.   OXYGEN  Take 2.5-4 L by mouth continuous. 2.5 L with resting state & 4 L with exertion [employer]   Polyethyl Glycol-Propyl Glycol 0.4-0.3 % SOLN Place 1 drop into both eyes daily.   potassium chloride  SA (KLOR-CON  M) 20 MEQ tablet TAKE 2 TABLETS EVERY MORNING AND TAKE 1 TABLET EVERY EVENING (CHANGE IN DOSE)    pravastatin  (PRAVACHOL ) 40 MG tablet Take 1 tablet (40 mg total) by mouth daily.   Riociguat  2 MG TABS Take 2 mg by mouth in the morning, at noon, and at bedtime.   Selexipag  (UPTRAVI ) 200 MCG TABS Take 1 tablet (200 mcg total) by mouth 2 (two) times daily.   torsemide  (DEMADEX ) 20 MG tablet TAKE 3 TABLETS EVERY MORNING AND TAKE 2 TABLETS EVERY  EVENING (CHANGE IN DOSE)   Vitamin D , Ergocalciferol , (DRISDOL ) 1.25 MG (50000 UNIT) CAPS capsule Take 1 capsule (50,000 Units total) by mouth every 7 (seven) days.   zolpidem  (AMBIEN ) 5 MG tablet Take 1 tablet (5 mg total) by mouth at bedtime as needed for sleep (please allow to fill every 30 days).   predniSONE  (DELTASONE ) 20 MG tablet Take 2 tablets (40 mg total) by mouth daily with breakfast for 5 days, THEN 1 tablet (20 mg total) daily with breakfast for 5 days. (Patient not taking: Reported on 04/15/2024)   No facility-administered encounter medications on file as of 04/15/2024.     Review of Systems  Review of Systems  N/a Physical Exam  BP 102/63   Pulse 72   Ht 5\' 9"  (1.753 m)   Wt 191 lb 3.2 oz (86.7 kg)   SpO2 92%   BMI 28.24 kg/m   Wt Readings from Last 5 Encounters:  04/15/24 191 lb 3.2 oz (86.7 kg)  04/08/24 193 lb 3.2 oz (87.6 kg)  03/18/24 191 lb (86.6 kg)  02/11/24 194 lb (88 kg)  12/22/23 196 lb 3.2 oz (89 kg)    BMI Readings from Last 5 Encounters:  04/15/24 28.24 kg/m  04/08/24 28.53 kg/m  03/18/24 28.21 kg/m  02/11/24 28.65 kg/m  12/22/23 28.97 kg/m     Physical Exam General: Sitting upright, no acute distress Eyes: EOMI, no icterus Neck: Range of motion appears intact Pulmonary: Normal work of breathing, on 2.5L  Neuro: No focal deficits noted Psych: Normal mood, full affect   Assessment & Plan:    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  On Imuran  50 mg BID for arthritis, and for her ILD.  To continue.  After discussion she is amenable to antifibrotic, paperwork for Ofev today.   Will need lab work, LFTs, intensive drug monitoring.  Repeat PFTs, high-resolution CT.  Pulmonary hypertension: Likely multifactorial, group 1 given connective tissue disease, group 3 given ILD.  Continue medicines per Dr. Harlie Lieu and cardiology (riociguat  2.5 TID, opsumit , uptravi  200 mcg BID).   Emphysema and asthma overlap: atopic symptoms, mild improvement in breathing with ICS/LABA.  Switch to Trelegy given concerns for side effects.  Side effects no longer felt related to Breztri , just TMJ.  Continue Breztri  2 puff twice daily.      Return in about 3 months (around 07/16/2024) for f/u Dr. Marygrace Snellen, after CT scan, after PFT.   Guerry Leek, MD 04/15/2024

## 2024-04-15 NOTE — Patient Instructions (Addendum)
 I am glad you are doing well  Continue all your medications  Will repeat pulmonary function test and CT scan to see if we need to change any medications in the future  Lab work, paperwork today for Ofev, this is a medication to try to slow down scarring or fibrosis  Return to clinic in 3 months or sooner as needed with Dr. Marygrace Snellen

## 2024-04-16 LAB — HEPATIC FUNCTION PANEL
ALT: 9 U/L (ref 0–35)
AST: 16 U/L (ref 0–37)
Albumin: 4.1 g/dL (ref 3.5–5.2)
Alkaline Phosphatase: 41 U/L (ref 39–117)
Bilirubin, Direct: 0.1 mg/dL (ref 0.0–0.3)
Total Bilirubin: 0.4 mg/dL (ref 0.2–1.2)
Total Protein: 6.8 g/dL (ref 6.0–8.3)

## 2024-04-21 ENCOUNTER — Ambulatory Visit (HOSPITAL_COMMUNITY): Payer: Self-pay

## 2024-04-21 NOTE — Telephone Encounter (Signed)
Patient scheduled and made aware

## 2024-04-23 ENCOUNTER — Telehealth: Payer: Self-pay

## 2024-04-23 DIAGNOSIS — Z5181 Encounter for therapeutic drug level monitoring: Secondary | ICD-10-CM

## 2024-04-23 DIAGNOSIS — J849 Interstitial pulmonary disease, unspecified: Secondary | ICD-10-CM

## 2024-04-23 NOTE — Telephone Encounter (Signed)
 Received New start paperwork for OFEV. Will update as we work through the benefits process.  Submitted a Prior Authorization request to [employer] for OFEV via CoverMyMeds. Will update once we receive a response.  Key: [id number]

## 2024-04-26 ENCOUNTER — Other Ambulatory Visit (HOSPITAL_COMMUNITY): Payer: Self-pay

## 2024-04-26 MED ORDER — OFEV 150 MG PO CAPS: 150.0000 mg | ORAL_CAPSULE | Freq: Two times a day (BID) | ORAL | 1 refills | Status: DC

## 2024-04-26 NOTE — Telephone Encounter (Signed)
 Received notification from [employer] regarding a prior authorization for OFEV . Authorization has been APPROVED from 04/23/24 to 12/08/24. Approval letter sent to scan center.  Per test claim, copay for 30 days supply is $0  Patient can fill through [facility]: [phone] (where she is filling one of her other PAH medications)  Geraldene Kleine, PharmD, MPH, BCPS, CPP Clinical Pharmacist (Rheumatology and Pulmonology)

## 2024-04-27 ENCOUNTER — Ambulatory Visit (HOSPITAL_COMMUNITY)
Admission: RE | Admit: 2024-04-27 | Discharge: 2024-04-27 | Disposition: A | Discharge status: Home / Self Care | Source: Ambulatory Visit | Attending: Internal Medicine | Admitting: Internal Medicine

## 2024-04-27 DIAGNOSIS — D509 Iron deficiency anemia, unspecified: Secondary | ICD-10-CM | POA: Insufficient documentation

## 2024-04-27 MED ORDER — SODIUM CHLORIDE 0.9 % IV SOLN
510.0000 mg | Freq: Once | INTRAVENOUS | Status: AC
  Administered 2024-04-27: 510 mg via INTRAVENOUS
  Filled 2024-04-27: qty 510

## 2024-04-27 MED ORDER — FERUMOXYTOL INJECTION 510 MG/17 ML: 510.0000 mg | Freq: Once | INTRAVENOUS | Status: DC

## 2024-04-27 NOTE — Telephone Encounter (Signed)
 Attempted to call patient to inform her of Ofev  approval and perform medication counseling. Unable to reach her or leave voicemail. MyChart message sent.    Tolu Nyree Yonker, PharmD [employer] PGY1

## 2024-04-29 NOTE — Telephone Encounter (Signed)
 Attempted to speak to patient for new start Ofev  counseling. Unable to reach her or leave a voicemail. Will continue to follow.   Tolu Shaye Lagace, PharmD [facility] PGY-1

## 2024-05-05 ENCOUNTER — Telehealth (HOSPITAL_COMMUNITY): Payer: Self-pay | Admitting: Cardiology

## 2024-05-05 NOTE — Telephone Encounter (Signed)
 Patient called to report she was seen by pulmonary today and prescribed ofev . Would like providers input in medications

## 2024-05-05 NOTE — Telephone Encounter (Signed)
 I looked over Dr. Cari Char note and think that Ofev  would be a good addition for her.

## 2024-05-10 ENCOUNTER — Encounter: Payer: Self-pay | Admitting: Pharmacist

## 2024-05-10 ENCOUNTER — Other Ambulatory Visit (HOSPITAL_COMMUNITY): Payer: Self-pay | Admitting: Cardiology

## 2024-05-10 NOTE — Telephone Encounter (Signed)
 Called patient to discuss Ofev . She started Ofev  on 05/04/2024. Tolerating well. Also received loperamide with Ofev . No diarrhea episodes or GI distress since starting  She is establishing with nephrology on 06/02/2024 and will request they draw CMP at visit. Will try ot f/u with patient after this date regarding labs  Geraldene Kleine, PharmD, MPH, BCPS, CPP Clinical Pharmacist (Rheumatology and Pulmonology)

## 2024-05-18 ENCOUNTER — Other Ambulatory Visit

## 2024-05-19 ENCOUNTER — Ambulatory Visit
Admission: RE | Admit: 2024-05-19 | Discharge: 2024-05-19 | Disposition: A | Discharge status: Home / Self Care | Source: Ambulatory Visit | Attending: Pulmonary Disease | Admitting: Pulmonary Disease

## 2024-05-19 DIAGNOSIS — J849 Interstitial pulmonary disease, unspecified: Secondary | ICD-10-CM

## 2024-05-24 ENCOUNTER — Ambulatory Visit (HOSPITAL_COMMUNITY)
Admission: RE | Admit: 2024-05-24 | Discharge: 2024-05-24 | Disposition: A | Discharge status: Home / Self Care | Source: Ambulatory Visit | Attending: Internal Medicine | Admitting: Internal Medicine

## 2024-05-24 DIAGNOSIS — Q240 Dextrocardia: Secondary | ICD-10-CM | POA: Diagnosis present

## 2024-05-24 DIAGNOSIS — I270 Primary pulmonary hypertension: Secondary | ICD-10-CM | POA: Insufficient documentation

## 2024-05-24 LAB — ECHOCARDIOGRAM COMPLETE
AR max vel: 2.67 cm2
AV Area VTI: 2.76 cm2
AV Area mean vel: 2.82 cm2
AV Mean grad: 10 mmHg
AV Peak grad: 18.5 mmHg
Ao pk vel: 2.15 m/s
Area-P 1/2: 3.68 cm2
S' Lateral: 3 cm

## 2024-05-26 NOTE — Telephone Encounter (Addendum)
 Pt aware, agreeable, and verbalized understanding   ----- Message from Kelly Donaldson sent at 05/24/2024  2:29 PM EDT ----- EF 65-70%, GIDD, RV normal. Echo unchanged for the most part as compared to last echo  ----- Message ----- From: Interface, Three One Seven Sent: 05/24/2024   2:12 PM EDT To: Kelly Donna, NP

## 2024-06-02 ENCOUNTER — Other Ambulatory Visit (HOSPITAL_COMMUNITY): Payer: Self-pay | Admitting: Nephrology

## 2024-06-02 DIAGNOSIS — N1831 Chronic kidney disease, stage 3a: Secondary | ICD-10-CM

## 2024-07-16 ENCOUNTER — Other Ambulatory Visit (HOSPITAL_COMMUNITY)
Admission: RE | Admit: 2024-07-16 | Discharge: 2024-07-16 | Disposition: A | Discharge status: Home / Self Care | Source: Ambulatory Visit | Attending: Nephrology | Admitting: Nephrology

## 2024-07-16 DIAGNOSIS — R7989 Other specified abnormal findings of blood chemistry: Secondary | ICD-10-CM | POA: Insufficient documentation

## 2024-07-16 DIAGNOSIS — D582 Other hemoglobinopathies: Secondary | ICD-10-CM | POA: Insufficient documentation

## 2024-07-16 DIAGNOSIS — E211 Secondary hyperparathyroidism, not elsewhere classified: Secondary | ICD-10-CM | POA: Diagnosis not present

## 2024-07-16 DIAGNOSIS — R760 Raised antibody titer: Secondary | ICD-10-CM | POA: Insufficient documentation

## 2024-07-16 DIAGNOSIS — R8279 Other abnormal findings on microbiological examination of urine: Secondary | ICD-10-CM | POA: Insufficient documentation

## 2024-07-16 DIAGNOSIS — I129 Hypertensive chronic kidney disease with stage 1 through stage 4 chronic kidney disease, or unspecified chronic kidney disease: Secondary | ICD-10-CM | POA: Insufficient documentation

## 2024-07-16 DIAGNOSIS — R778 Other specified abnormalities of plasma proteins: Secondary | ICD-10-CM | POA: Insufficient documentation

## 2024-07-16 DIAGNOSIS — D631 Anemia in chronic kidney disease: Secondary | ICD-10-CM | POA: Diagnosis present

## 2024-07-16 DIAGNOSIS — E1122 Type 2 diabetes mellitus with diabetic chronic kidney disease: Secondary | ICD-10-CM | POA: Diagnosis not present

## 2024-07-16 DIAGNOSIS — Z114 Encounter for screening for human immunodeficiency virus [HIV]: Secondary | ICD-10-CM | POA: Insufficient documentation

## 2024-07-16 DIAGNOSIS — N1832 Chronic kidney disease, stage 3b: Secondary | ICD-10-CM | POA: Diagnosis not present

## 2024-07-16 LAB — RENAL FUNCTION PANEL
Albumin: 4.2 g/dL (ref 3.5–5.0)
Anion gap: 12 (ref 5–15)
BUN: 15 mg/dL (ref 8–23)
CO2: 27 mmol/L (ref 22–32)
Calcium: 9 mg/dL (ref 8.9–10.3)
Chloride: 99 mmol/L (ref 98–111)
Creatinine, Ser: 0.96 mg/dL (ref 0.44–1.00)
GFR, Estimated: >60 mL/min (ref >60)
Glucose, Bld: 95 mg/dL (ref 70–99)
Phosphorus: 2.9 mg/dL (ref 2.5–4.6)
Potassium: 3.5 mmol/L (ref 3.5–5.1)
Sodium: 138 mmol/L (ref 135–145)

## 2024-07-16 LAB — URINALYSIS, W/ REFLEX TO CULTURE (INFECTION SUSPECTED)
Bilirubin Urine: NEGATIVE
Glucose, UA: NEGATIVE mg/dL
Ketones, ur: NEGATIVE mg/dL
Leukocytes,Ua: NEGATIVE
Nitrite: NEGATIVE
Protein, ur: NEGATIVE mg/dL
Specific Gravity, Urine: 1.009 (ref 1.005–1.030)
pH: 7 (ref 5.0–8.0)

## 2024-07-16 LAB — CBC
HCT: 39.2 % (ref 36.0–46.0)
Hemoglobin: 12.7 g/dL (ref 12.0–15.0)
MCH: 29.9 pg (ref 26.0–34.0)
MCHC: 32.4 g/dL (ref 30.0–36.0)
MCV: 92.2 fL (ref 80.0–100.0)
Platelets: 173 K/uL (ref 150–400)
RBC: 4.25 MIL/uL (ref 3.87–5.11)
RDW: 16.2 % — ABNORMAL HIGH (ref 11.5–15.5)
WBC: 5.2 K/uL (ref 4.0–10.5)
nRBC: 0 % (ref 0.0–0.2)

## 2024-07-16 LAB — PROTEIN, URINE, 24 HOUR
Collection Interval-UPROT: 24 h
Protein, Urine: <6 mg/dL
Urine Total Volume-UPROT: 900 mL

## 2024-07-16 LAB — HEPATITIS B SURFACE ANTIGEN: Hepatitis B Surface Ag: NONREACTIVE

## 2024-07-16 LAB — VITAMIN B12: Vitamin B-12: 717 pg/mL (ref 180–914)

## 2024-07-16 LAB — IRON AND TIBC
Iron: 82 ug/dL (ref 28–170)
Saturation Ratios: 20 % (ref 10.4–31.8)
TIBC: 411 ug/dL (ref 250–450)
UIBC: 329 ug/dL

## 2024-07-16 LAB — HEPATIC FUNCTION PANEL
ALT: 11 U/L (ref 0–44)
AST: 25 U/L (ref 15–41)
Albumin: 4.3 g/dL (ref 3.5–5.0)
Alkaline Phosphatase: 48 U/L (ref 38–126)
Bilirubin, Direct: 0.1 mg/dL (ref 0.0–0.2)
Indirect Bilirubin: 0.8 mg/dL (ref 0.3–0.9)
Total Bilirubin: 0.9 mg/dL (ref 0.0–1.2)
Total Protein: 7.6 g/dL (ref 6.5–8.1)

## 2024-07-16 LAB — HEPATITIS B SURFACE ANTIBODY,QUALITATIVE: Hep B S Ab: NONREACTIVE

## 2024-07-16 LAB — CREATININE, URINE, 24 HOUR
Collection Interval-UCRE24: 24 h
Creatinine, 24H Ur: 638 mg/d (ref 600–1800)
Creatinine, Urine: 51 mg/dL
Urine Total Volume-UCRE24: 1250 mL

## 2024-07-16 LAB — MAGNESIUM: Magnesium: 2.5 mg/dL — ABNORMAL HIGH (ref 1.7–2.4)

## 2024-07-16 LAB — URIC ACID: Uric Acid, Serum: 7.6 mg/dL — ABNORMAL HIGH (ref 2.5–7.1)

## 2024-07-16 LAB — FOLATE: Folate: 10.1 ng/mL (ref >5.9)

## 2024-07-16 LAB — VITAMIN D 25 HYDROXY (VIT D DEFICIENCY, FRACTURES): Vit D, 25-Hydroxy: 56.93 ng/mL (ref 30–100)

## 2024-07-16 LAB — HEMOGLOBIN A1C
Hgb A1c MFr Bld: 5.6 % (ref 4.8–5.6)
Mean Plasma Glucose: 114 mg/dL

## 2024-07-16 LAB — FERRITIN: Ferritin: 41 ng/mL (ref 11–307)

## 2024-07-17 LAB — MISC LABCORP TEST (SEND OUT): Labcorp test code: 121265

## 2024-07-17 LAB — ANA: Anti Nuclear Antibody (ANA): NEGATIVE

## 2024-07-17 LAB — HCV AB W REFLEX TO QUANT PCR: HCV Ab: NONREACTIVE

## 2024-07-17 LAB — HEPATITIS B CORE ANTIBODY, TOTAL: HEP B CORE AB: NEGATIVE

## 2024-07-17 LAB — C4 COMPLEMENT: Complement C4, Body Fluid: 23 mg/dL (ref 12–38)

## 2024-07-17 LAB — C3 COMPLEMENT: C3 Complement: 145 mg/dL (ref 82–167)

## 2024-07-17 LAB — MICROALBUMIN / CREATININE URINE RATIO
Creatinine, Urine: 58.3 mg/dL
Microalb Creat Ratio: 8 mg/g{creat} (ref 0–29)
Microalb, Ur: 4.6 ug/mL — ABNORMAL HIGH

## 2024-07-17 LAB — HCV INTERPRETATION

## 2024-07-18 LAB — HIV-1/HIV-2 QUAL RNA
HIV-1 RNA, Qualitative: NONREACTIVE
HIV-2 RNA, Qualitative: NONREACTIVE

## 2024-07-18 LAB — PARATHYROID HORMONE, INTACT (NO CA): PTH: 67 pg/mL — ABNORMAL HIGH (ref 15–65)

## 2024-07-19 LAB — PROTEIN ELECTROPHORESIS, SERUM
A/G Ratio: 1.5 (ref 0.7–1.7)
Albumin ELP: 4.1 g/dL (ref 2.9–4.4)
Alpha-1-Globulin: 0.3 g/dL (ref 0.0–0.4)
Alpha-2-Globulin: 0.7 g/dL (ref 0.4–1.0)
Beta Globulin: 0.9 g/dL (ref 0.7–1.3)
Gamma Globulin: 0.9 g/dL (ref 0.4–1.8)
Globulin, Total: 2.7 g/dL (ref 2.2–3.9)
Total Protein ELP: 6.8 g/dL (ref 6.0–8.5)

## 2024-07-19 LAB — MISC LABCORP TEST (SEND OUT)
Labcorp test code: 141330
Labcorp test code: 120256

## 2024-07-19 LAB — IMMUNOFIXATION, URINE

## 2024-07-21 ENCOUNTER — Other Ambulatory Visit: Payer: Self-pay | Admitting: Family Medicine

## 2024-07-21 DIAGNOSIS — J9611 Chronic respiratory failure with hypoxia: Secondary | ICD-10-CM

## 2024-07-21 DIAGNOSIS — I2721 Secondary pulmonary arterial hypertension: Secondary | ICD-10-CM

## 2024-07-21 DIAGNOSIS — J069 Acute upper respiratory infection, unspecified: Secondary | ICD-10-CM

## 2024-07-22 ENCOUNTER — Other Ambulatory Visit: Payer: Self-pay | Admitting: *Deleted

## 2024-07-22 DIAGNOSIS — J849 Interstitial pulmonary disease, unspecified: Secondary | ICD-10-CM

## 2024-07-27 ENCOUNTER — Encounter: Payer: Self-pay | Admitting: Pulmonary Disease

## 2024-07-27 ENCOUNTER — Ambulatory Visit: Admitting: Pulmonary Disease

## 2024-07-27 VITALS — BP 99/57 | HR 71 | Temp 98.0°F | Ht 67.0 in | Wt 182.0 lb

## 2024-07-27 DIAGNOSIS — J9611 Chronic respiratory failure with hypoxia: Secondary | ICD-10-CM

## 2024-07-27 DIAGNOSIS — J849 Interstitial pulmonary disease, unspecified: Secondary | ICD-10-CM

## 2024-07-27 DIAGNOSIS — J432 Centrilobular emphysema: Secondary | ICD-10-CM

## 2024-07-27 LAB — PULMONARY FUNCTION TEST
DL/VA % pred: 52 %
DL/VA: 2.13 ml/min/mmHg/L
DLCO cor % pred: 36 %
DLCO cor: 7.8 ml/min/mmHg
DLCO unc % pred: 34 %
DLCO unc: 7.5 ml/min/mmHg
FEF 25-75 Post: 2.19 L/s
FEF 25-75 Pre: 1.4 L/s
FEF2575-%Change-Post: 56 %
FEF2575-%Pred-Post: 101 %
FEF2575-%Pred-Pre: 64 %
FEV1-%Change-Post: 6 %
FEV1-%Pred-Post: 68 %
FEV1-%Pred-Pre: 64 %
FEV1-Post: 1.79 L
FEV1-Pre: 1.69 L
FEV1FVC-%Change-Post: -3 %
FEV1FVC-%Pred-Pre: 103 %
FEV6-%Change-Post: 9 %
FEV6-%Pred-Post: 70 %
FEV6-%Pred-Pre: 64 %
FEV6-Post: 2.33 L
FEV6-Pre: 2.13 L
FEV6FVC-%Change-Post: 0 %
FEV6FVC-%Pred-Post: 103 %
FEV6FVC-%Pred-Pre: 104 %
FVC-%Change-Post: 10 %
FVC-%Pred-Post: 68 %
FVC-%Pred-Pre: 61 %
FVC-Post: 2.35 L
FVC-Pre: 2.13 L
Post FEV1/FVC ratio: 76 %
Post FEV6/FVC ratio: 99 %
Pre FEV1/FVC ratio: 79 %
Pre FEV6/FVC Ratio: 100 %
RV % pred: 98 %
RV: 2.27 L
TLC % pred: 79 %
TLC: 4.38 L

## 2024-07-27 MED ORDER — BREZTRI AEROSPHERE 160-9-4.8 MCG/ACT IN AERO: 2.0000 | INHALATION_SPRAY | Freq: Two times a day (BID) | RESPIRATORY_TRACT | 4 refills | Status: AC

## 2024-07-27 NOTE — Patient Instructions (Signed)
 Full PFT performed today.

## 2024-07-27 NOTE — Progress Notes (Signed)
 Full PFT performed today.

## 2024-07-27 NOTE — Progress Notes (Signed)
 @Patient  ID: Kelly Donaldson, female    DOB: December 14, 1955, 68 y.o.   MRN: [id number]  Chief Complaint  Patient presents with   Follow-up    F/U PFT and CT  Chief complaint: Shortness of breath  Referring provider: [name], DO  HPI:   68 y.o. 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  Formally patient of Dr. McQuaid and more recently Dr. Darlean.  Most recent cardiology note reviewed.   Overall, doing okay.  Breathing stable.  She reports good adherence to triple oral pulmonary vasodilators, limited in Uptravi  dose to 200 mcg twice daily.  Dyspnea stable.  Oxygen  stable at 2 L..  Started antifibrotic's in the interim.  Cause daily headache.  Suddenly stopped 2 weeks ago.  No headache this morning.  No headache prior to starting Ofev .  Discussed taken off this for now consider starting at lower dose in the future if needed.  PFTs in interim are stable, full interpretation below.  CT high-resolution interim are stable on my review interpretation.  All good news.  Clinically doing well.  Questionaires / Pulmonary Flowsheets:   ACT:      No data to display          MMRC: mMRC Dyspnea Scale mMRC Score  04/13/2021 10:39 AM 2    Epworth:      No data to display          Tests:   FENO:  No results found for: NITRICOXIDE  PFT:    Latest Ref Rng & Units 07/27/2024   12:47 PM 06/22/2021   12:01 PM 06/18/2018    9:08 AM 03/20/2015    9:09 AM  PFT Results  FVC-Pre L 2.13  P 2.17  2.37  2.33   FVC-Predicted Pre % 61  P 75  80  76   FVC-Post L 2.35  P 2.28   2.32   FVC-Predicted Post % 68  P 79   76   Pre FEV1/FVC % % 79  P 79  80  78   Post FEV1/FCV % % 76  P 79   77   FEV1-Pre L 1.69  P 1.72  1.90  1.81   FEV1-Predicted Pre % 64  P 76  82  75   FEV1-Post L 1.79  P 1.79   1.79   DLCO uncorrected ml/min/mmHg 7.50  P 6.88  6.52  7.39   DLCO UNC% % 34  P 31  23  26    DLCO corrected ml/min/mmHg 7.80  P  8.12     DLCO COR %Predicted % 36  P  28    DLVA Predicted % 52  P 40  43  35   TLC L 4.38  P 3.86  4.07  3.63   TLC % Predicted % 79  P 70  73  66   RV % Predicted % 98  P 68  81  38     P Preliminary result  Personally reviewed and interpreted as mild restriction with severely reduced DLCO consistent with concomitant ILD and pulmonary hypertension. Overall values stable since 2016.  WALK:     07/11/2020   10:48 AM 11/22/2019    8:58 AM 06/18/2018   10:50 AM 11/26/2016   10:48 AM 05/09/2015    9:39 AM 03/20/2015   12:14 PM 01/11/2015    4:00 PM  SIX MIN WALK  Medications aspirin  81mg , hydrocodone -acetaminophen  10-325mg , opsumit  10mg , potassium chloride   20mg , adempas  2mg , selexipag  200mcg, torsemide  20mg , taken at 7:30am   ASA 81mg , Celexa  40mg , Vit D 50000iu, Norco 10-325mg , Macitenan 10mg , klor Con 20meq, Riociguat  1.5mg , Selexipag  , Demedex 20mg  @ 0600     Supplimental Oxygen  during Test? (L/min) Yes Yes Yes Yes Yes Yes Yes  O2 Flow Rate 3 L/min 2 L/min 6 L/min 4 L/min 4 L/min 2 L/min 2 L/min  Type Continuous Continuous Continuous Pulse Pulse Continuous Continuous  Laps 14   5      Partial Lap (in Meters) 0 meters   15 meters     Baseline BP (sitting) 112/70   104/62     Baseline Heartrate 68 73  73     Baseline Dyspnea (Borg Scale) 0   0.5     Baseline Fatigue (Borg Scale) 0   0     Baseline SPO2 96 % 94 %  100 %     BP (sitting) 120/80   124/58     Heartrate 85 100  92     Dyspnea (Borg Scale) 2   4     Fatigue (Borg Scale) 2   4     SPO2 84 % 54 %  87 %     BP (sitting) 114/76   114/64     Heartrate 72 72  86     SPO2 97 % 94 %  93 %     Stopped or Paused before Six Minutes Yes Yes  Yes     Other Symptoms at end of Exercise Pt stopped with 50 sec remaining due to just being done. Pt also stated that she felt like her knee was popping out of joint. tired  pt stopped at 3:33secs d/t fatigue and dyspnea, resumed 3:23secs.  stopped at 2:52sec d/t fatigue and dyspnea (Dyspnea Borg  = 10  sat = 86% 4lpm pulsed), increased O2 to 5lpm pulsed, sats recovered to 95%, resumed test at 1:02secs     Interpretation Leg pain        Distance Completed 476 meters   255 meters      Distance Completed 0 meters        Tech Comments: Pt walked at a normal pace, denied any complaints during walk. stopped with 50 seconds remaining due to being done with the walk. 304.79meters pt started walking on 3L but needed 6L to maintain at 91%, we stopped when O2 reached 75% TA/CMA  after sat dropped to 88%4lpm pulsed--increased o2 to 5lpm pulsed and pt walked another lap with sats ending at 79%5lpm pulsed/normal pace/no SOB//lmr After lap 1 sats= 87%2lpm--increased o2 to 4 lpm and walked another lap and sats = 89%4lpm cont/pt c/o minimal SOB/walked normal pace//lmr normal pace/stopped due to increased SOB//lmr     Data saved with a previous flowsheet row definition    Imaging: Personally reviewed CT high-resolution chest most recent 06/2018 demonstrates upper lobe predominant emphysema as well as what appears to be NSIP pattern ILD with traction bronchiectasis in bilateral lower lobes on my interpretation  Lab Results: Personally reviewed, notably eosinophils as high as 700 CBC    Component Value Date/Time   WBC 5.2 07/16/2024 1132   RBC 4.25 07/16/2024 1132   HGB 12.7 07/16/2024 1132   HGB 10.5 (L) 03/18/2024 1031   HCT 39.2 07/16/2024 1132   HCT 33.4 (L) 03/18/2024 1031   PLT 173 07/16/2024 1132   PLT 211 03/18/2024 1031   MCV 92.2 07/16/2024 1132   MCV 82 03/18/2024 1031  MCH 29.9 07/16/2024 1132   MCHC 32.4 07/16/2024 1132   RDW 16.2 (H) 07/16/2024 1132   RDW 16.0 (H) 03/18/2024 1031   LYMPHSABS 1.0 12/22/2023 1144   MONOABS 0.5 01/28/2023 1554   EOSABS 0.2 12/22/2023 1144   BASOSABS 0.0 12/22/2023 1144    BMET    Component Value Date/Time   NA 138 07/16/2024 1123   NA 139 03/18/2024 1031   K 3.5 07/16/2024 1123   CL 99 07/16/2024 1123   CO2 27 07/16/2024 1123   GLUCOSE 95  07/16/2024 1123   BUN 15 07/16/2024 1123   BUN 15 03/18/2024 1031   CREATININE 0.96 07/16/2024 1123   CALCIUM 9.0 07/16/2024 1123   GFRNONAA >60 07/16/2024 1123   GFRAA 66 01/22/2021 0947    BNP    Component Value Date/Time   BNP 62.0 04/08/2024 1205    ProBNP    Component Value Date/Time   PROBNP 69.0 01/11/2015 1630    Specialty Problems       Pulmonary Problems   Chronic respiratory failure with hypoxia (HCC)   Followed in Pulmonary clinic/ [facility]/ [place]  On 02 since 2013 (originally [place]) - pulmonary eval [facility] last seen 12/17/12 with neg CTa for PE, pfts ok except dlco 20%, echo c/w  PH, 02 dep hypoxemia > rec RHC and stop phenterimine -Echo 09/15/14     LAE mildly dilated, as are RV and RA  - 01/11/2015   Walked 2lpm  x one lap @ 185 stopped due to  Sob, nl pace, no desat - Quit smoking 03/02/15  - PFTs 03/20/2015  Nl except for DLCO 26 corrects to 35%  - 03/20/2015   Walked 2lpm x one lap @ 185 stopped due to sob/ desat corrected on 4lpm  - 04/07/2015 rec  referral to Dr Vergil for Right heart Cath - Repeat Echo with bubble study 04/19/2015 > With injection of agitated saline intravenously   there is appearance of bubbles in L sided chambers consistent   with 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 - 05/09/2015   Walked 4lpm x one lap @ 185 stopped due to  88% one lap > 05/11/15 Pos small PFO - 07/11/2020 desat during 6 mw on 3lpm cont   As of 07/11/2020  = 2lpm hs and rest/ up to 5lpm POC           ILD (interstitial lung disease) (HCC)   08/2016 high-resolution CT scan of the chest: McQuaid review: Peripheral based interlobular septal thickening and groundglass consistent with fibrosis with some mild bronchiectasis, fibrotic changes appear to be more prominent in the lower lobes with upper lobe nodules, no air trapping noted on expiratory images, mediastinal lymphadenopathy stable per radiology  Chest HRCT 07/07/18 . CT pattern is considered indeterminate for usual  interstitial pneumonia (UIP). 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 prior to study with DLCO  6.5 (23%) corrects to 2.23 (43%)  for alv volume and FV curve mild/mod curvature in effort indep portion of f/v > started on anoro and helped doe  CT chest 07/07/18 mild centrilobular and paraseptal  emphysema. - 02/21/2020  After extensive coaching inhaler device,  effectiveness =    90% with smi > changed to stiolto per insurance req         Allergies  Allergen Reactions   Gabapentin Other (See Comments)    Pt states that she can not take with antidepressants   Latex Rash    Immunization History  Administered Date(s) Administered   Fluad Quad(high Dose 65+) 09/27/2022   Fluad Trivalent(High Dose 65+) 09/17/2023   Influenza Split 09/16/2014, 09/20/2015, 09/08/2017, 10/04/2017   Influenza,inj,Quad PF,6+ Mos 09/04/2016, 11/12/2018, 08/17/2019, 09/07/2020   Influenza-Unspecified 10/13/2017, 08/29/2021   Moderna Sars-Covid-2 Vaccination 02/21/2020, 03/20/2020, 12/16/2020   PNEUMOCOCCAL CONJUGATE-20 03/27/2022   Pneumococcal Polysaccharide-23 11/11/2019   Tdap 03/20/2023   Zoster Recombinant(Shingrix) 09/20/2020, 04/17/2021    Past Medical History:  Diagnosis Date   Allergy    Depression    GERD (gastroesophageal reflux disease)    Hyperlipidemia    Oxygen  dependent    Shortness of breath dyspnea    Varicose veins     Tobacco History: Social History   Tobacco Use  Smoking Status Former   Current packs/day: 0.00   Average packs/day: 0.3 packs/day for 17.0 years (4.3 ttl pk-yrs)   Types: Cigarettes   Start date:  03/01/1998   Quit date: 03/02/2015   Years since quitting: 9.4  Smokeless Tobacco Never   Counseling given: Not Answered   Continue to not smoke  Outpatient Encounter Medications as of 07/27/2024  Medication Sig   Accu-Chek Softclix Lancets lancets Check glucose BID Dx R73.03   albuterol  (VENTOLIN  HFA) 108 (90 Base) MCG/ACT inhaler INHALE 2 PUFFS EVERY 4 HOURS AS NEEDED FOR WHEEZING OR SHORTNESS OF BREATH   Alcohol Swabs (B-D SINGLE USE SWABS REGULAR) PADS Check glucose BID Dx R73.03   aspirin  EC 81 MG tablet Take 1 tablet (81 mg total) by mouth daily.   azaTHIOprine  (IMURAN ) 50 MG tablet Take 1 tablet (50 mg total) by mouth in the morning and at bedtime.   Blood Glucose Monitoring Suppl (ACCU-CHEK AVIVA PLUS) w/Device KIT Check glucose BID Dx R73.03   citalopram  (CELEXA ) 20 MG tablet TAKE 1 TABLET EVERY DAY   ferrous sulfate  (SLOW IRON ) 160 (50 Fe) MG TBCR SR tablet Take 1 tablet (160 mg total) by mouth daily.   fluticasone  (FLONASE ) 50 MCG/ACT nasal spray SPRAY 1 SPRAY INTO BOTH NOSTRILS DAILY.   glucose blood (ACCU-CHEK AVIVA PLUS) test strip Check glucose BID Dx R73.03   HYDROcodone -acetaminophen  (NORCO) 10-325 MG tablet Take 0.5-1 tablets by mouth up to 4- 5 times daily only as needed for chronic multifactorial pain.   ipratropium-albuterol  (DUONEB) 0.5-2.5 (3) MG/3ML SOLN INHALE THE CONTENTS OF 1 VIAL VIA NEBULIZER FOUR TIMES DAILY AS NEEDED   Lancets Misc. (ACCU-CHEK SOFTCLIX LANCET DEV) KIT Check glucose BID Dx R73.03   loratadine  (CLARITIN ) 10 MG tablet Take 1 tablet (10 mg total) by mouth daily.   macitentan  (OPSUMIT ) 10 MG tablet TAKE 1 TABLET (10MG ) BY MOUTH DAILY   OXYGEN  Take 2.5-4 L by mouth continuous. 2.5 L with resting state & 4 L with exertion [employer]   Polyethyl Glycol-Propyl Glycol 0.4-0.3 % SOLN Place 1 drop into both eyes daily.   potassium chloride  SA (KLOR-CON  M) 20 MEQ tablet TAKE 2 TABLETS EVERY MORNING AND TAKE 1 TABLET EVERY EVENING (CHANGE IN DOSE)    pravastatin  (PRAVACHOL ) 40 MG tablet Take 1 tablet (40 mg total) by mouth daily.   Riociguat  2 MG TABS Take 2 mg by mouth in  the morning, at noon, and at bedtime.   Selexipag  (UPTRAVI ) 200 MCG TABS TAKE 1 TABLET BY MOUTH 2 TIMES A DAY   torsemide  (DEMADEX ) 20 MG tablet TAKE 3 TABLETS EVERY MORNING AND TAKE 2 TABLETS EVERY EVENING (CHANGE IN DOSE)   Vitamin D , Ergocalciferol , (DRISDOL ) 1.25 MG (50000 UNIT) CAPS capsule Take 1 capsule (50,000 Units total) by mouth every 7 (seven) days.   zolpidem  (AMBIEN ) 5 MG tablet Take 1 tablet (5 mg total) by mouth at bedtime as needed for sleep (please allow to fill every 30 days).   [DISCONTINUED] BREZTRI  AEROSPHERE 160-9-4.8 MCG/ACT AERO INHALE 2 PUFFS INTO THE LUNGS IN THE MORNING AND AT BEDTIME.   [DISCONTINUED] Nintedanib (OFEV ) 150 MG CAPS Take 1 capsule (150 mg total) by mouth 2 (two) times daily. Recheck labs monthly for 6 months   budesonide-glycopyrrolate-formoterol (BREZTRI  AEROSPHERE) 160-9-4.8 MCG/ACT AERO inhaler Inhale 2 puffs into the lungs in the morning and at bedtime.   dicyclomine (BENTYL) 20 MG tablet Take 20 mg by mouth daily. (Patient not taking: Reported on 07/27/2024)   montelukast  (SINGULAIR ) 10 MG tablet Take 1 tablet (10 mg total) by mouth at bedtime. (Patient not taking: Reported on 07/27/2024)   No facility-administered encounter medications on file as of 07/27/2024.     Review of Systems  Review of Systems  N/a Physical Exam  BP (!) 99/57   Pulse 71   Temp 98 F (36.7 C)   Ht 5' 7 (1.702 m)   Wt 182 lb (82.6 kg)   SpO2 (!) 89% Comment: 2L pulse o2  BMI 28.51 kg/m   Wt Readings from Last 5 Encounters:  07/27/24 182 lb (82.6 kg)  04/27/24 189 lb (85.7 kg)  04/15/24 191 lb 3.2 oz (86.7 kg)  04/08/24 193 lb 3.2 oz (87.6 kg)  03/18/24 191 lb (86.6 kg)    BMI Readings from Last 5 Encounters:  07/27/24 28.51 kg/m  04/27/24 27.91 kg/m  04/15/24 28.24 kg/m  04/08/24 28.53 kg/m  03/18/24 28.21 kg/m      Physical Exam General: Sitting upright, no acute distress Eyes: EOMI, no icterus Neck: Range of motion appears intact Pulmonary: Normal work of breathing, on 2L [place] Neuro: No focal deficits noted Psych: Normal mood, full affect   Assessment & Plan:    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  On Imuran  50 mg BID for arthritis, and for her ILD.  To continue.  PFTs and CT high-resolution are stable over time which is remarkable in great news.  Started Ofev  at last visit with significant headache.  Now stopped.  Consider resuming in the future at lower dose.  Requires intensive drug monitoring with urine, recent CBC reviewed and is.  Pulmonary hypertension: Likely multifactorial, group 1 given connective tissue disease, group 3 given ILD.  Continue medicines per Dr. Vergil and cardiology (riociguat  2.5 TID, opsumit , uptravi  200 mcg BID).   Emphysema and asthma overlap: atopic symptoms, mild improvement in breathing with ICS/LABA.  Switch to Trelegy given concerns for side effects.  Side effects no longer felt related to Breztri , just TMJ.  Continue Breztri  2 puff twice daily.      Return in about 6 months (around 01/27/2025) for f/u Dr. Annella.   Donnice JONELLE Annella, MD 07/27/2024

## 2024-07-27 NOTE — Patient Instructions (Signed)
 PFTs and CT scan stable  No change in the medication  Stay off Ofev   Return to clinic in 6 months or sooner as needed with Dr. Annella

## 2024-08-21 ENCOUNTER — Other Ambulatory Visit (HOSPITAL_COMMUNITY): Payer: Self-pay | Admitting: Cardiology

## 2024-08-21 DIAGNOSIS — I5032 Chronic diastolic (congestive) heart failure: Secondary | ICD-10-CM

## 2024-08-31 LAB — LAB REPORT - SCANNED: EGFR: 60

## 2024-09-03 ENCOUNTER — Ambulatory Visit (HOSPITAL_COMMUNITY)
Admission: RE | Admit: 2024-09-03 | Discharge: 2024-09-03 | Disposition: A | Discharge status: Home / Self Care | Source: Ambulatory Visit | Attending: Nephrology | Admitting: Nephrology

## 2024-09-03 DIAGNOSIS — N1831 Chronic kidney disease, stage 3a: Secondary | ICD-10-CM | POA: Diagnosis present

## 2024-09-10 ENCOUNTER — Ambulatory Visit: Payer: Medicare PPO

## 2024-09-10 VITALS — BP 99/57 | HR 71 | Ht 67.0 in | Wt 182.0 lb

## 2024-09-10 DIAGNOSIS — Z0000 Encounter for general adult medical examination without abnormal findings: Secondary | ICD-10-CM

## 2024-09-10 NOTE — Patient Instructions (Signed)
 Kelly Donaldson,  Thank you for taking the time for your Medicare Wellness Visit. I appreciate your continued commitment to your health goals. Please review the care plan we discussed, and feel free to reach out if I can assist you further.  Medicare recommends these wellness visits once per year to help you and your care team stay ahead of potential health issues. These visits are designed to focus on prevention, allowing your provider to concentrate on managing your acute and chronic conditions during your regular appointments.  Please note that Annual Wellness Visits do not include a physical exam. Some assessments may be limited, especially if the visit was conducted virtually. If needed, we may recommend a separate in-person follow-up with your provider.  Ongoing Care Seeing your primary care provider every 3 to 6 months helps us  monitor your health and provide consistent, personalized care.   Referrals If a referral was made during today's visit and you haven't received any updates within two weeks, please contact the referred provider directly to check on the status.  Recommended Screenings:  Health Maintenance  Topic Date Due   Flu Shot  07/09/2024   COVID-19 Vaccine (4 - 2025-26 season) 08/09/2024   Medicare Annual Wellness Visit  09/09/2024   DEXA scan (bone density measurement)  10/15/2024   Breast Cancer Screening  11/27/2024   Colon Cancer Screening  07/18/2029   DTaP/Tdap/Td vaccine (2 - Td or Tdap) 03/19/2033   Pneumococcal Vaccine for age over 54  Completed   Hepatitis C Screening  Completed   Zoster (Shingles) Vaccine  Completed   HPV Vaccine  Aged Out   Meningitis B Vaccine  Aged Out       09/10/2024    8:58 AM  Advanced Directives  Does Patient Have a Medical Advance Directive? No   Advance Care Planning is important because it: Ensures you receive medical care that aligns with your values, goals, and preferences. Provides guidance to your family and loved ones,  reducing the emotional burden of decision-making during critical moments.  Vision: Annual vision screenings are recommended for early detection of glaucoma, cataracts, and diabetic retinopathy. These exams can also reveal signs of chronic conditions such as diabetes and high blood pressure.  Dental: Annual dental screenings help detect early signs of oral cancer, gum disease, and other conditions linked to overall health, including heart disease and diabetes.  Please see the attached documents for additional preventive care recommendations.

## 2024-09-10 NOTE — Progress Notes (Signed)
 Subjective:   Kelly Donaldson is a 68 y.o. who presents for a Medicare Wellness preventive visit.  As a reminder, Annual Wellness Visits don't include a physical exam, and some assessments may be limited, especially if this visit is performed virtually. We may recommend an in-person follow-up visit with your provider if needed.  Visit Complete: Virtual I connected with  Kelly Donaldson on 09/10/24 by a audio enabled telemedicine application and verified that I am speaking with the correct person using two identifiers.  Patient Location: Home  Provider Location: Home Office  I discussed the limitations of evaluation and management by telemedicine. The patient expressed understanding and agreed to proceed.  Vital Signs: Because this visit was a virtual/telehealth visit, some criteria may be missing or patient reported. Any vitals not documented were not able to be obtained and vitals that have been documented are patient reported.  VideoDeclined- This patient declined Librarian, academic. Therefore the visit was completed with audio only.  Persons Participating in Visit: Patient.  AWV Questionnaire: No: Patient Medicare AWV questionnaire was not completed prior to this visit.  Cardiac Risk Factors include: advanced age (>69men, >36 women);dyslipidemia     Objective:    Today's Vitals   09/10/24 0850  BP: (!) 99/57  Pulse: 71  Weight: 182 lb (82.6 kg)  Height: 5' 7 (1.702 m)   Body mass index is 28.51 kg/m.     09/10/2024    8:58 AM 09/10/2023    1:38 PM 03/27/2023    6:33 AM 06/08/2020    9:49 AM 06/07/2019    9:39 AM 05/06/2018   10:03 AM 06/13/2017   11:42 AM  Advanced Directives  Does Patient Have a Medical Advance Directive? No Yes Yes Yes No No  No   Type of Special educational needs teacher of [place];Living will Healthcare Power of [place];Living will Living will     Does patient want to make changes to medical advance directive?    No -  Patient declined     Copy of Healthcare Power of Attorney in Chart?  No - copy requested       Would patient like information on creating a medical advance directive?     Yes (MAU/Ambulatory/Procedural Areas - Information given)  No - Patient declined  No - Patient declined      Data saved with a previous flowsheet row definition    Current Medications (verified) Outpatient Encounter Medications as of 09/10/2024  Medication Sig   Accu-Chek Softclix Lancets lancets Check glucose BID Dx R73.03   albuterol  (VENTOLIN  HFA) 108 (90 Base) MCG/ACT inhaler INHALE 2 PUFFS EVERY 4 HOURS AS NEEDED FOR WHEEZING OR SHORTNESS OF BREATH   Alcohol Swabs (B-D SINGLE USE SWABS REGULAR) PADS Check glucose BID Dx R73.03   aspirin  EC 81 MG tablet Take 1 tablet (81 mg total) by mouth daily.   azaTHIOprine  (IMURAN ) 50 MG tablet Take 1 tablet (50 mg total) by mouth in the morning and at bedtime.   Blood Glucose Monitoring Suppl (ACCU-CHEK AVIVA PLUS) w/Device KIT Check glucose BID Dx R73.03   budesonide-glycopyrrolate-formoterol (BREZTRI  AEROSPHERE) 160-9-4.8 MCG/ACT AERO inhaler Inhale 2 puffs into the lungs in the morning and at bedtime.   citalopram  (CELEXA ) 20 MG tablet TAKE 1 TABLET EVERY DAY   dicyclomine (BENTYL) 20 MG tablet Take 20 mg by mouth daily.   ferrous sulfate  (SLOW IRON ) 160 (50 Fe) MG TBCR SR tablet Take 1 tablet (160 mg total) by mouth daily.  fluticasone  (FLONASE ) 50 MCG/ACT nasal spray SPRAY 1 SPRAY INTO BOTH NOSTRILS DAILY.   glucose blood (ACCU-CHEK AVIVA PLUS) test strip Check glucose BID Dx R73.03   HYDROcodone -acetaminophen  (NORCO) 10-325 MG tablet Take 0.5-1 tablets by mouth up to 4- 5 times daily only as needed for chronic multifactorial pain.   ipratropium-albuterol  (DUONEB) 0.5-2.5 (3) MG/3ML SOLN INHALE THE CONTENTS OF 1 VIAL VIA NEBULIZER FOUR TIMES DAILY AS NEEDED   Lancets Misc. (ACCU-CHEK SOFTCLIX LANCET DEV) KIT Check glucose BID Dx R73.03   loratadine  (CLARITIN ) 10 MG tablet  Take 1 tablet (10 mg total) by mouth daily.   macitentan  (OPSUMIT ) 10 MG tablet TAKE 1 TABLET (10MG ) BY MOUTH DAILY   OXYGEN  Take 2.5-4 L by mouth continuous. 2.5 L with resting state & 4 L with exertion [facility]   Polyethyl Glycol-Propyl Glycol 0.4-0.3 % SOLN Place 1 drop into both eyes daily.   potassium chloride  SA (KLOR-CON  M) 20 MEQ tablet TAKE 2 TABLETS EVERY MORNING AND TAKE 1 TABLET EVERY EVENING (CHANGE IN DOSE)   pravastatin  (PRAVACHOL ) 40 MG tablet Take 1 tablet (40 mg total) by mouth daily.   Riociguat  2 MG TABS Take 2 mg by mouth in the morning, at noon, and at bedtime.   Selexipag  (UPTRAVI ) 200 MCG TABS TAKE 1 TABLET BY MOUTH 2 TIMES A DAY   torsemide  (DEMADEX ) 20 MG tablet TAKE 3 TABLETS EVERY MORNING AND TAKE 2 TABLETS EVERY EVENING (CHANGE IN DOSE)   Vitamin D , Ergocalciferol , (DRISDOL ) 1.25 MG (50000 UNIT) CAPS capsule Take 1 capsule (50,000 Units total) by mouth every 7 (seven) days.   zolpidem  (AMBIEN ) 5 MG tablet Take 1 tablet (5 mg total) by mouth at bedtime as needed for sleep (please allow to fill every 30 days).   montelukast  (SINGULAIR ) 10 MG tablet Take 1 tablet (10 mg total) by mouth at bedtime. (Patient not taking: Reported on 09/10/2024)   No facility-administered encounter medications on file as of 09/10/2024.    Allergies (verified) Gabapentin and Latex   History: Past Medical History:  Diagnosis Date   Allergy    Depression    GERD (gastroesophageal reflux disease)    Hyperlipidemia    Oxygen  dependent    Shortness of breath dyspnea    Varicose veins    Past Surgical History:  Procedure Laterality Date   CARDIAC CATHETERIZATION N/A 04/20/2015   Procedure: Right Heart Cath;  Surgeon: Ezra GORMAN Shuck, MD;  Location: [facility] INVASIVE CV LAB;  Service: Cardiovascular;  Laterality: N/A;   RIGHT HEART CATH N/A 06/13/2017   Procedure: Right Heart Cath;  Surgeon: Shuck Ezra GORMAN, MD;  Location[facility] INVASIVE CV LAB;  Service: Cardiovascular;  Laterality: N/A;    RIGHT HEART CATH N/A 05/06/2018   Procedure: RIGHT HEART CATH;  Surgeon: Shuck Ezra GORMAN, MD;  Location: [facility] INVASIVE CV LAB;  Service: Cardiovascular;  Laterality: N/A;   RIGHT HEART CATH N/A 03/27/2023   Procedure: RIGHT HEART CATH;  Surgeon: Shuck Ezra GORMAN, MD;  Location: [facility] INVASIVE CV LAB;  Service: Cardiovascular;  Laterality: N/A;   TEE WITHOUT CARDIOVERSION N/A 05/11/2015   Procedure: TRANSESOPHAGEAL ECHOCARDIOGRAM (TEE);  Surgeon: Ezra GORMAN Shuck, MD;  Location: [facility] ENDOSCOPY;  Service: Cardiovascular;  Laterality: N/A;   TUBAL LIGATION     11/84   Family History  Problem Relation Age of Onset   Hyperlipidemia Mother    Hypertension Mother    Diabetes Mother    Other Mother        varicose veins   Hyperlipidemia  Father    Hypertension Father    Emphysema Father        smoked   Hypertension Daughter    Asthma Sister    Clotting disorder Daughter    Breast cancer Maternal Grandmother    Ovarian cancer Maternal Aunt    Social History   Socioeconomic History   Marital status: Married    Spouse name: [name]   Number of children: 3   Years of education: Not on file   Highest education level: 12th grade  Occupational History   Occupation: Disabled  Tobacco Use   Smoking status: Former    Current packs/day: 0.00    Average packs/day: 0.3 packs/day for 17.0 years (4.3 ttl pk-yrs)    Types: Cigarettes    Start date: 03/01/1998    Quit date: 03/02/2015    Years since quitting: 9.5   Smokeless tobacco: Never  Vaping Use   Vaping status: Never Used  Substance and Sexual Activity   Alcohol use: No    Alcohol/week: 0.0 standard drinks of alcohol   Drug use: Yes    Types: Cocaine    Comment: 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 3 children. Enjoys gardening and spending time with grandchildren.    Social Drivers of Corporate investment banker  Strain: Low Risk  (09/10/2024)   Overall Financial Resource Strain (CARDIA)    Difficulty of Paying Living Expenses: Not hard at all  Food Insecurity: Food Insecurity Present (09/10/2024)   Hunger Vital Sign    Worried About Running Out of Food in the Last Year: Sometimes true    Ran Out of Food in the Last Year: Sometimes true  Transportation Needs: No Transportation Needs (09/10/2024)   PRAPARE - Administrator, Civil Service (Medical): No    Lack of Transportation (Non-Medical): No  Physical Activity: Inactive (09/10/2024)   Exercise Vital Sign    Days of Exercise per Week: 0 days    Minutes of Exercise per Session: 0 min  Stress: No Stress Concern Present (09/10/2024)   [employer] of Occupational Health - Occupational Stress Questionnaire    Feeling of Stress: Not at all  Social Connections: Moderately Integrated (09/10/2024)   Social Connection and Isolation Panel    Frequency of Communication with Friends and Family: More than three times a week    Frequency of Social Gatherings with Friends and Family: More than three times a week    Attends Religious Services: More than 4 times per year    Active Member of [employer] or Organizations: No    Attends Engineer, structural: Never    Marital Status: Married    Tobacco Counseling Counseling given: Yes    Clinical Intake:  Pre-visit preparation completed: Yes  Pain : No/denies pain     BMI - recorded: 28.51 Nutritional Status: BMI 25 -29 Overweight Nutritional Risks: None Diabetes: No  Lab Results  Component Value Date   HGBA1C 5.6 07/16/2024   HGBA1C 5.6 03/18/2024     How often do you need to have someone help you when you read instructions, pamphlets, or other written materials from your doctor or pharmacy?: 1 - Never  Interpreter Needed?: No  Information entered by :: alia t/cma   Activities of Daily Living     09/10/2024    8:57 AM  In your present  state of health, do you have any  difficulty performing the following activities:  Hearing? 0  Vision? 1  Difficulty concentrating or making decisions? 0  Walking or climbing stairs? 1  Dressing or bathing? 0  Doing errands, shopping? 1  Comment pt's husband  Quarry manager and eating ? N  Using the Toilet? N  In the past six months, have you accidently leaked urine? N  Do you have problems with loss of bowel control? N  Managing your Medications? N  Managing your Finances? N  Housekeeping or managing your Housekeeping? N    Patient Care Team: [name], DO as PCP - General (Family Medicine)  I have updated your Care Teams any recent Medical Services you may have received from other providers in the past year.     Assessment:   This is a routine wellness examination for Kelly Donaldson.  Hearing/Vision screen Hearing Screening - Comments:: Pt denies hearing dif Vision Screening - Comments:: Pt state have some vision dif/last ov 2025    Goals Addressed             This Visit's Progress    Exercise 3x per week (30 min per time)   On track    Try to exercise for at least 30 minutes, 3 time weekly       Depression Screen     09/10/2024    9:00 AM 03/18/2024   10:45 AM 02/11/2024    3:17 PM 02/11/2024    3:15 PM 12/22/2023   11:15 AM 09/17/2023   10:41 AM 09/10/2023    1:37 PM  PHQ 2/9 Scores  PHQ - 2 Score 5 1 4  0 0 0 0  PHQ- 9 Score 13 2 13  0 0 0     Fall Risk     09/10/2024    8:52 AM 03/18/2024   10:39 AM 02/11/2024    3:16 PM 12/22/2023   11:15 AM 09/17/2023   10:41 AM  Fall Risk   Falls in the past year? 1 0 0 0 1  Number falls in past yr: 0 0 0  1  Injury with Fall? 0 0 0  0  Risk for fall due to : Impaired balance/gait;Impaired mobility No Fall Risks No Fall Risks  Impaired balance/gait;Impaired mobility;History of fall(s)  Follow up Falls evaluation completed;Education provided Falls evaluation completed   Education provided    MEDICARE RISK AT HOME:  Medicare Risk at Home Any stairs  in or around the home?: Yes If so, are there any without handrails?: Yes Home free of loose throw rugs in walkways, pet beds, electrical cords, etc?: Yes Adequate lighting in your home to reduce risk of falls?: Yes Life alert?: No Use of a cane, walker or w/c?: No Grab bars in the bathroom?: Yes Shower chair or bench in shower?: No Elevated toilet seat or a handicapped toilet?: Yes  TIMED UP AND GO:  Was the test performed?  no  Cognitive Function: 6CIT completed        09/10/2024    9:09 AM 09/10/2023    1:39 PM 06/08/2020    9:53 AM 06/07/2019    9:41 AM  6CIT Screen  What Year? 0 points 0 points 0 points 0 points  What month? 0 points 0 points 0 points 0 points  What time? 0 points 0 points 0 points 0 points  Count back from 20 0 points 0 points 0 points 0 points  Months in reverse 2 points 0 points 4  points 0 points  Repeat phrase 2 points 0 points 0 points 0 points  Total Score 4 points 0 points 4 points 0 points    Immunizations Immunization History  Administered Date(s) Administered   Fluad Quad(high Dose 65+) 09/27/2022   Fluad Trivalent(High Dose 65+) 09/17/2023   Influenza Split 09/16/2014, 09/20/2015, 09/08/2017, 10/04/2017   Influenza,inj,Quad PF,6+ Mos 09/04/2016, 11/12/2018, 08/17/2019, 09/07/2020   Influenza-Unspecified 10/13/2017, 08/29/2021   Moderna Sars-Covid-2 Vaccination 02/21/2020, 03/20/2020, 12/16/2020   PNEUMOCOCCAL CONJUGATE-20 03/27/2022   Pneumococcal Polysaccharide-23 11/11/2019   Tdap 03/20/2023   Zoster Recombinant(Shingrix) 09/20/2020, 04/17/2021    Screening Tests Health Maintenance  Topic Date Due   Influenza Vaccine  07/09/2024   COVID-19 Vaccine (4 - 2025-26 season) 08/09/2024   DEXA SCAN  10/15/2024   Mammogram  11/27/2024   Medicare Annual Wellness (AWV)  09/10/2025   Colonoscopy  07/18/2029   DTaP/Tdap/Td (2 - Td or Tdap) 03/19/2033   Pneumococcal Vaccine: 50+ Years  Completed   Hepatitis C Screening  Completed   Zoster  Vaccines- Shingrix  Completed   HPV VACCINES  Aged Out   Meningococcal B Vaccine  Aged Out    Health Maintenance Items Addressed: See Nurse Notes at the end of this note  Additional Screening:  Vision Screening: Recommended annual ophthalmology exams for early detection of glaucoma and other disorders of the eye. Is the patient up to date with their annual eye exam?  Yes  Who is the provider or what is the name of the office in which the patient attends annual eye exams? N/a  Dental Screening: Recommended annual dental exams for proper oral hygiene  Community Resource Referral / Chronic Care Management: CRR required this visit?  No   CCM required this visit?  No   Plan:    I have personally reviewed and noted the following in the patient's chart:   Medical and social history Use of alcohol, tobacco or illicit drugs  Current medications and supplements including opioid prescriptions. Patient is currently taking opioid prescriptions. Information provided to patient regarding non-opioid alternatives. Patient advised to discuss non-opioid treatment plan with their provider. Functional ability and status Nutritional status Physical activity Advanced directives List of other physicians Hospitalizations, surgeries, and ER visits in previous 12 months Vitals Screenings to include cognitive, depression, and falls Referrals and appointments  In addition, I have reviewed and discussed with patient certain preventive protocols, quality metrics, and best practice recommendations. A written personalized care plan for preventive services as well as general preventive health recommendations were provided to patient.   Ozie Ned, CMA   09/10/2024   After Visit Summary: (MyChart) Due to this being a telephonic visit, the after visit summary with patients personalized plan was offered to patient via MyChart   Notes: Nothing significant to report at this time.

## 2024-09-17 ENCOUNTER — Encounter: Payer: Self-pay | Admitting: Family Medicine

## 2024-09-17 ENCOUNTER — Ambulatory Visit (INDEPENDENT_AMBULATORY_CARE_PROVIDER_SITE_OTHER): Admitting: Family Medicine

## 2024-09-17 VITALS — BP 76/37 | HR 71 | Temp 97.5°F | Ht 67.0 in | Wt 184.2 lb

## 2024-09-17 DIAGNOSIS — I959 Hypotension, unspecified: Secondary | ICD-10-CM

## 2024-09-17 DIAGNOSIS — F331 Major depressive disorder, recurrent, moderate: Secondary | ICD-10-CM

## 2024-09-17 DIAGNOSIS — F5104 Psychophysiologic insomnia: Secondary | ICD-10-CM

## 2024-09-17 MED ORDER — ZOLPIDEM TARTRATE 5 MG PO TABS: 5.0000 mg | ORAL_TABLET | Freq: Every evening | ORAL | 5 refills | Status: AC | PRN

## 2024-09-17 MED ORDER — MIRTAZAPINE 7.5 MG PO TABS
7.5000 mg | ORAL_TABLET | Freq: Every day | ORAL | 3 refills | Status: AC
Start: 2024-09-17 — End: ?

## 2024-09-17 NOTE — Progress Notes (Signed)
 Subjective: CC: Insomnia PCP: Kelly Donaldson HERO, DO YEP:Kelly Donaldson is a 68 y.o. female presenting to clinic today for:  Follow-up insomnia associated with anxiety and depression Patient reports stability of sleep with as needed use of Ambien .  She denies any excessive daytime sedation, falls, visual or auditory hallucinations.  Which does report to me that she has been having some intermittent blurting of words that come at random.  She admits feels like she does not have control over this and this has been going on for over a year but she did not want to mention at last visit.  Her husband and child are little concerned that this is happening.  She states that she went off of her Celexa  for a month and it got better but then her anxiety depression got much worse so she resumed use of the Celexa .  Wondering if she can switch off to a different antidepressant  No dizziness.  ROS: Per HPI  Allergies  Allergen Reactions   Gabapentin Other (See Comments)    Pt states that she can not take with antidepressants   Latex Rash   Past Medical History:  Diagnosis Date   Allergy    Depression    GERD (gastroesophageal reflux disease)    Hyperlipidemia    Oxygen  dependent    Shortness of breath dyspnea    Varicose veins     Current Outpatient Medications:    Accu-Chek Softclix Lancets lancets, Check glucose BID Dx R73.03, Disp: 200 each, Rfl: 3   albuterol  (VENTOLIN  HFA) 108 (90 Base) MCG/ACT inhaler, INHALE 2 PUFFS EVERY 4 HOURS AS NEEDED FOR WHEEZING OR SHORTNESS OF BREATH, Disp: 18 g, Rfl: 0   Alcohol Swabs (B-D SINGLE USE SWABS REGULAR) PADS, Check glucose BID Dx R73.03, Disp: 200 each, Rfl: 3   aspirin  EC 81 MG tablet, Take 1 tablet (81 mg total) by mouth daily., Disp: 90 tablet, Rfl: 3   azaTHIOprine  (IMURAN ) 50 MG tablet, Take 1 tablet (50 mg total) by mouth in the morning and at bedtime., Disp: 60 tablet, Rfl: 11   Blood Glucose Monitoring Suppl (ACCU-CHEK AVIVA PLUS) w/Device  KIT, Check glucose BID Dx R73.03, Disp: 1 kit, Rfl: 0   budesonide-glycopyrrolate-formoterol (BREZTRI  AEROSPHERE) 160-9-4.8 MCG/ACT AERO inhaler, Inhale 2 puffs into the lungs in the morning and at bedtime., Disp: 3 each, Rfl: 4   citalopram  (CELEXA ) 20 MG tablet, TAKE 1 TABLET EVERY DAY, Disp: 90 tablet, Rfl: 1   dicyclomine (BENTYL) 20 MG tablet, Take 20 mg by mouth daily., Disp: , Rfl:    ferrous sulfate  (SLOW IRON ) 160 (50 Fe) MG TBCR SR tablet, Take 1 tablet (160 mg total) by mouth daily., Disp: 100 tablet, Rfl: 3   fluticasone  (FLONASE ) 50 MCG/ACT nasal spray, SPRAY 1 SPRAY INTO BOTH NOSTRILS DAILY., Disp: 48 mL, Rfl: 3   glucose blood (ACCU-CHEK AVIVA PLUS) test strip, Check glucose BID Dx R73.03, Disp: 200 each, Rfl: 3   HYDROcodone -acetaminophen  (NORCO) 10-325 MG tablet, Take 0.5-1 tablets by mouth up to 4- 5 times daily only as needed for chronic multifactorial pain., Disp: 150 tablet, Rfl: 0   ipratropium-albuterol  (DUONEB) 0.5-2.5 (3) MG/3ML SOLN, INHALE THE CONTENTS OF 1 VIAL VIA NEBULIZER FOUR TIMES DAILY AS NEEDED, Disp: 360 mL, Rfl: 1   Lancets Misc. (ACCU-CHEK SOFTCLIX LANCET DEV) KIT, Check glucose BID Dx R73.03, Disp: 1 kit, Rfl: 0   loratadine  (CLARITIN ) 10 MG tablet, Take 1 tablet (10 mg total) by mouth daily., Disp: 90 tablet, Rfl:  3   macitentan  (OPSUMIT ) 10 MG tablet, TAKE 1 TABLET (10MG ) BY MOUTH DAILY, Disp: 30 tablet, Rfl: 11   montelukast  (SINGULAIR ) 10 MG tablet, Take 1 tablet (10 mg total) by mouth at bedtime. (Patient not taking: Reported on 09/10/2024), Disp: 30 tablet, Rfl: 11   OXYGEN , Take 2.5-4 L by mouth continuous. 2.5 L with resting state & 4 L with exertion [facility], Disp: , Rfl:    Polyethyl Glycol-Propyl Glycol 0.4-0.3 % SOLN, Place 1 drop into both eyes daily., Disp: , Rfl:    potassium chloride  SA (KLOR-CON  M) 20 MEQ tablet, TAKE 2 TABLETS EVERY MORNING AND TAKE 1 TABLET EVERY EVENING (CHANGE IN DOSE), Disp: 270 tablet, Rfl: 3   pravastatin  (PRAVACHOL ) 40  MG tablet, Take 1 tablet (40 mg total) by mouth daily., Disp: 30 tablet, Rfl: 11   Riociguat  2 MG TABS, Take 2 mg by mouth in the morning, at noon, and at bedtime., Disp: 270 tablet, Rfl: 3   Selexipag  (UPTRAVI ) 200 MCG TABS, TAKE 1 TABLET BY MOUTH 2 TIMES A DAY, Disp: 180 tablet, Rfl: 3   torsemide  (DEMADEX ) 20 MG tablet, TAKE 3 TABLETS EVERY MORNING AND TAKE 2 TABLETS EVERY EVENING (CHANGE IN DOSE), Disp: 450 tablet, Rfl: 3   Vitamin D , Ergocalciferol , (DRISDOL ) 1.25 MG (50000 UNIT) CAPS capsule, Take 1 capsule (50,000 Units total) by mouth every 7 (seven) days., Disp: 12 capsule, Rfl: 3   zolpidem  (AMBIEN ) 5 MG tablet, Take 1 tablet (5 mg total) by mouth at bedtime as needed for sleep (please allow to fill every 30 days)., Disp: 30 tablet, Rfl: 5 Social History   Socioeconomic History   Marital status: Married    Spouse name: Kelly Donaldson   Number of children: 3   Years of education: Not on file   Highest education level: 12th grade  Occupational History   Occupation: Disabled  Tobacco Use   Smoking status: Former    Current packs/day: 0.00    Average packs/day: 0.3 packs/day for 17.0 years (4.3 ttl pk-yrs)    Types: Cigarettes    Start date: 03/01/1998    Quit date: 03/02/2015    Years since quitting: 9.5   Smokeless tobacco: Never  Vaping Use   Vaping status: Never Used  Substance and Sexual Activity   Alcohol use: No    Alcohol/week: 0.0 standard drinks of alcohol   Drug use: Yes    Types: Cocaine    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 3 children. Enjoys gardening and spending time with grandchildren.    Social Drivers of Corporate investment banker Strain: Low Risk  (09/10/2024)   Overall Financial Resource Strain (CARDIA)    Difficulty of Paying Living Expenses: Not hard at all  Food Insecurity: Food Insecurity Present (09/10/2024)    Hunger Vital Sign    Worried About Running Out of Food in the Last Year: Sometimes true    Ran Out of Food in the Last Year: Sometimes true  Transportation Needs: No Transportation Needs (09/10/2024)   PRAPARE - Administrator, Civil Service (Medical): No    Lack of Transportation (Non-Medical): No  Physical Activity: Inactive (09/10/2024)   Exercise Vital Sign    Days of Exercise per Week: 0 days    Minutes of Exercise per Session: 0 min  Stress: No Stress Concern Present (09/10/2024)  [employer] of Occupational Health - Occupational Stress Questionnaire    Feeling of Stress: Not at all  Social Connections: Moderately Integrated (09/10/2024)   Social Connection and Isolation Panel    Frequency of Communication with Friends and Family: More than three times a week    Frequency of Social Gatherings with Friends and Family: More than three times a week    Attends Religious Services: More than 4 times per year    Active Member of [employer] or Organizations: No    Attends Banker Meetings: Never    Marital Status: Married  Catering manager Violence: Not At Risk (09/10/2024)   Humiliation, Afraid, Rape, and Kick questionnaire    Fear of Current or Ex-Partner: No    Emotionally Abused: No    Physically Abused: No    Sexually Abused: No   Family History  Problem Relation Age of Onset   Hyperlipidemia Mother    Hypertension Mother    Diabetes Mother    Other Mother        varicose veins   Hyperlipidemia Father    Hypertension Father    Emphysema Father        smoked   Hypertension Daughter    Asthma Sister    Clotting disorder Daughter    Breast cancer Maternal Grandmother    Ovarian cancer Maternal Aunt     Objective: Office vital signs reviewed. BP (!) 76/37   Pulse 71   Temp (!) 97.5 F (36.4 C)   Ht 5' 7 (1.702 m)   Wt 184 lb 4 oz (83.6 kg)   SpO2 93%   BMI 28.86 kg/m   Physical Examination:  General: Awake, alert, chronically  ill-appearing female, No acute distress HEENT: Sclera white.  Moist mucous membranes Cardio: regular rate and rhythm, S1S2 heard, + murmurs appreciated Pulm: clear to auscultation bilaterally, no wheezes, rhonchi or rales; normal work of breathing on 2L via [place] Psych: Mood stable, speech normal, affect appropriate     09/17/2024   11:37 AM 09/10/2024    9:00 AM 03/18/2024   10:45 AM  Depression screen PHQ 2/9  Decreased Interest 2 2 1   Down, Depressed, Hopeless 2 3 0  PHQ - 2 Score 4 5 1   Altered sleeping 0 1 1  Tired, decreased energy 3 2 0  Change in appetite 3 3 0  Feeling bad or failure about yourself  2 1 0  Trouble concentrating 1 1 0  Moving slowly or fidgety/restless 0 0 0  Suicidal thoughts 1 0 0  PHQ-9 Score 14 13 2   Difficult doing work/chores Not difficult at all Extremely dIfficult Somewhat difficult      09/17/2024   11:38 AM 03/18/2024   10:45 AM 02/11/2024    3:17 PM 02/11/2024    3:15 PM  GAD 7 : Generalized Anxiety Score  Nervous, Anxious, on Edge 1 0 3 0  Control/stop worrying 1 0 2 0  Worry too much - different things 1 0 2 0  Trouble relaxing 1 1 1  0  Restless 0 0 1 0  Easily annoyed or irritable 1 1 2  0  Afraid - awful might happen 1 0 2 0  Total GAD 7 Score 6 2 13  0  Anxiety Difficulty Not difficult at all   Not difficult at all    Assessment/ Plan: 68 y.o. female   Psychophysiological insomnia - Plan: zolpidem  (AMBIEN ) 5 MG tablet, mirtazapine (REMERON) 7.5 MG tablet  Moderate episode of recurrent major depressive  disorder (HCC) - Plan: mirtazapine (REMERON) 7.5 MG tablet  Hypotension, unspecified hypotension type   Ambien  renewed for prn use.  Up-to-date on urine drug screen and controlled substance contract.  Mirtazapine to replace Celexa .  Will plan to reevaluate in 1 month.  Blood pressure low.  She is totally asymptomatic.  On high dose diuretic.  I am going to reach out to her cardiologist to see if there is any room for reduction in  diuresis.   Donaldson CHRISTELLA Fielding, DO [facility] [phone]

## 2024-09-20 ENCOUNTER — Telehealth (HOSPITAL_COMMUNITY): Payer: Self-pay

## 2024-09-20 NOTE — Telephone Encounter (Signed)
 Called to confirm/remind patient of their appointment at the [facility] on 09/21/24.   Appointment:   [x] Confirmed  [] Left mess   [] No answer/No voice mail  [] VM Full/unable to leave message  [] Phone not in service  Patient reminded to bring all medications and/or complete list.  Confirmed patient has transportation. Gave directions, instructed to utilize valet parking.

## 2024-09-21 ENCOUNTER — Encounter (HOSPITAL_COMMUNITY): Payer: Self-pay

## 2024-09-21 ENCOUNTER — Ambulatory Visit (HOSPITAL_COMMUNITY)
Admission: RE | Admit: 2024-09-21 | Discharge: 2024-09-21 | Disposition: A | Discharge status: Home / Self Care | Source: Ambulatory Visit | Attending: Family Medicine | Admitting: Family Medicine

## 2024-09-21 ENCOUNTER — Ambulatory Visit (HOSPITAL_COMMUNITY): Payer: Self-pay | Admitting: Family Medicine

## 2024-09-21 VITALS — BP 118/62 | HR 62 | Ht 67.0 in | Wt 183.0 lb

## 2024-09-21 DIAGNOSIS — Z9981 Dependence on supplemental oxygen: Secondary | ICD-10-CM | POA: Diagnosis not present

## 2024-09-21 DIAGNOSIS — J9611 Chronic respiratory failure with hypoxia: Secondary | ICD-10-CM | POA: Insufficient documentation

## 2024-09-21 DIAGNOSIS — I2721 Secondary pulmonary arterial hypertension: Secondary | ICD-10-CM | POA: Insufficient documentation

## 2024-09-21 DIAGNOSIS — I493 Ventricular premature depolarization: Secondary | ICD-10-CM | POA: Diagnosis not present

## 2024-09-21 DIAGNOSIS — I272 Pulmonary hypertension, unspecified: Secondary | ICD-10-CM | POA: Diagnosis present

## 2024-09-21 DIAGNOSIS — E785 Hyperlipidemia, unspecified: Secondary | ICD-10-CM | POA: Diagnosis not present

## 2024-09-21 DIAGNOSIS — R002 Palpitations: Secondary | ICD-10-CM | POA: Diagnosis not present

## 2024-09-21 DIAGNOSIS — J8489 Other specified interstitial pulmonary diseases: Secondary | ICD-10-CM | POA: Insufficient documentation

## 2024-09-21 DIAGNOSIS — I50810 Right heart failure, unspecified: Secondary | ICD-10-CM | POA: Diagnosis not present

## 2024-09-21 DIAGNOSIS — Z79899 Other long term (current) drug therapy: Secondary | ICD-10-CM | POA: Diagnosis not present

## 2024-09-21 DIAGNOSIS — M069 Rheumatoid arthritis, unspecified: Secondary | ICD-10-CM | POA: Insufficient documentation

## 2024-09-21 DIAGNOSIS — Z87891 Personal history of nicotine dependence: Secondary | ICD-10-CM | POA: Diagnosis not present

## 2024-09-21 DIAGNOSIS — Z7982 Long term (current) use of aspirin: Secondary | ICD-10-CM | POA: Insufficient documentation

## 2024-09-21 LAB — LIPID PANEL
Cholesterol: 213 mg/dL — ABNORMAL HIGH (ref 0–200)
HDL: 78 mg/dL (ref >40)
LDL Cholesterol: 125 mg/dL — ABNORMAL HIGH (ref 0–99)
Total CHOL/HDL Ratio: 2.7 ratio
Triglycerides: 52 mg/dL (ref <150)
VLDL: 10 mg/dL (ref 0–40)

## 2024-09-21 LAB — BASIC METABOLIC PANEL WITH GFR
Anion gap: 13 (ref 5–15)
BUN: 15 mg/dL (ref 8–23)
CO2: 24 mmol/L (ref 22–32)
Calcium: 8.7 mg/dL — ABNORMAL LOW (ref 8.9–10.3)
Chloride: 102 mmol/L (ref 98–111)
Creatinine, Ser: 1.12 mg/dL — ABNORMAL HIGH (ref 0.44–1.00)
GFR, Estimated: 54 mL/min — ABNORMAL LOW (ref >60)
Glucose, Bld: 87 mg/dL (ref 70–99)
Potassium: 3.7 mmol/L (ref 3.5–5.1)
Sodium: 139 mmol/L (ref 135–145)

## 2024-09-21 LAB — BRAIN NATRIURETIC PEPTIDE: B Natriuretic Peptide: 41.8 pg/mL (ref 0.0–100.0)

## 2024-09-21 NOTE — Progress Notes (Addendum)
 [facility] Note   Date:  09/21/2024   ID:  Kelly Donaldson, DOB 1956-07-10, MRN [id number]  Provider location: [facility] Type of Visit: Established patient   PCP:  [name], DO  Pulmonologist: Dr. Annella Cardiologist:  Dr. Rolan   History of Present Illness: Kelly Donaldson is a 68 y.o. female who has a history of chronic hypoxemic respiratory failure and pulmonary hypertension. She has been followed by Dr Darlean with Pulmonology, now followed by Dr. Annella.  Has been on home oxygen  for several years. She quit smoking in 2016. Echo 5/16 showed dilated RV with severe pulmonary hypertension.  Therefore she had RHC 5/16 which confirmed severe pulmonary hypertension and RV failure.  PFTs showed only mild obstruction and restriction with markedly decreased DLCO suggestive of pulmonary vascular disease.  There was concern for sarcoidosis on CT chest but lymph node biopsy showed no evidence for sarcoidosis.  V/Q scan showed no evidence for chronic PE.    She has not tolerated Revatio  or Adcirca .  Have failed previous up-titration of Selexipag  with pelvic pain, tolerated 200 mcg bid.  Tolerating riociguat  2 mg tid.    Due to ongoing dyspnea, she had repeat RHC in 5/19 that showed normal filling pressures and well-controlled pulmonary hypertension.  Echo 6/19 showed normal LV EF, normal RV systolic function, and mild RV dilation.   Echo 9/20: EF 55-60%, mild RV dilation with normal systolic function, PASP 26 mmHg, IVC normal.   She has been diagnosed with rheumatoid arthritis, which may explain her ILD.   Echo 11/21: EF 55-60% with normal RV, PASP 30, IVC normal.   Echo 4/24: EF 65-70%, mildly D-shaped septum, mild RV dilation with normal RV systolic function, PASP 51 mmHg, normal IVC.    RHC 4/24: mild pulmonary hypertension, low PAPi suggests a degree of RV failure, preserved CI, PVR 2.5 WU   Echo 6/25: EF 65-70%, GIDD, RV normal,  RVSP 29 mmhg   Today she returns for HF follow up. Overall feeling fine. She has SOB walking further distances on flat ground, physically limited by knee OA. She wears oxygen  continuously. Feels occasional palpitations. Denies abnormal bleeding, CP, dizziness, edema, or PND/Orthopnea. Appetite ok. Weight at home 183 pounds. Taking all medications.   ECG (personally reviewed): NSR    Labs (4/25): LDL 134 Labs (9/25): K 3.5, creatinine 1.02   6 minute walk (5/16): 238 meters => decreased oxygen  saturation to 73%, increased to 6 L [place] 6 minute walk (7/16): 170 meters => unable to complete.  6 minute walk (9/16): 201 meters 6 minute walk (1/17): 256 meters 6 minute walk (4/17): 219 meters 6 minute walk (8/17): 225 meters 6 minute walk (11/17): 244 meters 6 minute walk (5/18): 229 meters 6 minute walk (2/19): 122 meters (but she was stopped halfway through with drop in oxygen  saturation).  6 minute walk (12/20): 304 meters 6 minute walk (8/21): 476 m 6 minute walk (3/22): 213 meters   PMH: 1. Pulmonary hypertension: RHC (5/16) with mean RA 18, PA 83/33 mean 53, mean PCWP 19, CI 2.61, PVR 5.8 WU.   Echo (5/16) with EF 65-70%, septal flattening, dilated RV with PA systolic pressure 99 mmHg, +bubble study.  PFTs (4/16) with FVC 76%, FEV1 75%, ratio 97%, TLC 66%, DLCO 26% => mild restriction, mild obstruction, severe diffuse abnormality (pulmonary vascular problem).  CTA chest (4/16) with chronic interstitial and obstructive lung disease, small mediastinal and hilar lymph noes, no PE.  Lymph node biopsy negative for sarcoidosis (reactive changes).  V/Q scan (6/16): No acute or chronic PE. TEE (6/16): Normal LV size and systolic function, EF 55-60%, RV was mildly dilated with mildly decreased systolic function, D-shaped interventricular septum suggestive of RV pressure/volume overload, there appeared to be a small PFO present with some bubbles crossing, no ASD.  ANA negative, anti-RNP negative, HIV  negative. Sleep study (8/16) without OSA.  She did not tolerate Adcirca  or Revatio .  She cannot tolerate more than 200 mcg bid Selexipag .  - Echo (6/17): EF 60-65%, normal diastolic function, D-shaped interventricular septum, mildly dilated RV with normal RV systolic function, PASP 39, IVC normal.  - Echo (6/18): EF 60-65%, normal diastolic function, D-shaped interventricular septum, mildly dilated RV with normal RV systolic function, PASP 65 mmHg.  - RHC (7/18): RA 13, PA 51/23 mean 36, PCWP 19, CI 4.74, PVR 1.6 WU - RHC (5/19): mean RA 8, PA 48/20 mean 30, mean PCWP 13, CI 5.06, PVR 1.58 WU.  - Echo (6/19): EF 60-65%, mild RV dilation with normal RV systolic function, PASP 60, possible PFO.  - Echo (9/20): EF 55-60%, mild RV dilation with normal systolic function, PASP 26 mmHg, IVC normal.  - Echo (11/21): EF 55-60%, normal RV, PASP 30 mmHg, IVC normal.  - Echo (4/24): EF 65-70%, mildly D-shaped septum, mild RV dilation with normal RV systolic function, PASP 51 mmHg, normal IVC.  - RHC (4/24): mean RA 14, PA 39/17 mean 26, mean PCWP 10, PAPi 1.57, CI 3.1, PVR 2.5 WU.  - Echo 6/25: EF 65-70%, GIDD, RV normal, RVSP 29 mmhg  2. Chronic venous insufficiency. 3. Hyperlipidemia.  4. GERD 5. Chronic hypoxemic respiratory failure: Hypoxemia, on home oxygen .  Seen by Dr Alaine, has emphysema + interstitial lung disease (?UIP => suspect related to RA).  - PFTs (7/19): FEV1 82%, FVC 80%, ratio 101%, TLC 73%, DLCO 23%.  6. Fe deficiency anemia: EGD/colonoscopy at [facility] normal in 2020.  7. Rheumatoid arthritis: Azathioprine .  8. PVCs: Zio monitor in 2/24 showed 2.2% PVCs.  Current Outpatient Medications  Medication Sig Dispense Refill   Accu-Chek Softclix Lancets lancets Check glucose BID Dx R73.03 200 each 3   albuterol  (VENTOLIN  HFA) 108 (90 Base) MCG/ACT inhaler INHALE 2 PUFFS EVERY 4 HOURS AS NEEDED FOR WHEEZING OR SHORTNESS OF BREATH 18 g 0   Alcohol Swabs (B-D SINGLE USE SWABS REGULAR) PADS  Check glucose BID Dx R73.03 200 each 3   aspirin  EC 81 MG tablet Take 1 tablet (81 mg total) by mouth daily. 90 tablet 3   azaTHIOprine  (IMURAN ) 50 MG tablet Take 1 tablet (50 mg total) by mouth in the morning and at bedtime. 60 tablet 11   Blood Glucose Monitoring Suppl (ACCU-CHEK AVIVA PLUS) w/Device KIT Check glucose BID Dx R73.03 1 kit 0   budesonide-glycopyrrolate-formoterol (BREZTRI  AEROSPHERE) 160-9-4.8 MCG/ACT AERO inhaler Inhale 2 puffs into the lungs in the morning and at bedtime. 3 each 4   dicyclomine (BENTYL) 20 MG tablet Take 20 mg by mouth daily.     ferrous sulfate  (SLOW IRON ) 160 (50 Fe) MG TBCR SR tablet Take 1 tablet (160 mg total) by mouth daily. 100 tablet 3   fluticasone  (FLONASE ) 50 MCG/ACT nasal spray SPRAY 1 SPRAY INTO BOTH NOSTRILS DAILY. 48 mL 3   glucose blood (ACCU-CHEK AVIVA PLUS) test strip Check glucose BID Dx R73.03 200 each 3   HYDROcodone -acetaminophen  (NORCO) 10-325 MG tablet Take 0.5-1 tablets by mouth up to 4- 5 times daily only  as needed for chronic multifactorial pain. 150 tablet 0   ipratropium-albuterol  (DUONEB) 0.5-2.5 (3) MG/3ML SOLN INHALE THE CONTENTS OF 1 VIAL VIA NEBULIZER FOUR TIMES DAILY AS NEEDED 360 mL 1   Lancets Misc. (ACCU-CHEK SOFTCLIX LANCET DEV) KIT Check glucose BID Dx R73.03 1 kit 0   loratadine  (CLARITIN ) 10 MG tablet Take 1 tablet (10 mg total) by mouth daily. 90 tablet 3   macitentan  (OPSUMIT ) 10 MG tablet TAKE 1 TABLET (10MG ) BY MOUTH DAILY 30 tablet 11   mirtazapine (REMERON) 7.5 MG tablet Take 1 tablet (7.5 mg total) by mouth at bedtime. To replace citalopram  90 tablet 3   montelukast  (SINGULAIR ) 10 MG tablet Take 1 tablet (10 mg total) by mouth at bedtime. 30 tablet 11   OXYGEN  Take 2.5-4 L by mouth continuous. 2.5 L with resting state & 4 L with exertion [facility]     Polyethyl Glycol-Propyl Glycol 0.4-0.3 % SOLN Place 1 drop into both eyes daily.     potassium chloride  SA (KLOR-CON  M) 20 MEQ tablet TAKE 2 TABLETS EVERY MORNING  AND TAKE 1 TABLET EVERY EVENING (CHANGE IN DOSE) 270 tablet 3   pravastatin  (PRAVACHOL ) 40 MG tablet Take 1 tablet (40 mg total) by mouth daily. 30 tablet 11   Riociguat  2 MG TABS Take 2 mg by mouth in the morning, at noon, and at bedtime. 270 tablet 3   Selexipag  (UPTRAVI ) 200 MCG TABS TAKE 1 TABLET BY MOUTH 2 TIMES A DAY 180 tablet 3   torsemide  (DEMADEX ) 20 MG tablet TAKE 3 TABLETS EVERY MORNING AND TAKE 2 TABLETS EVERY EVENING (CHANGE IN DOSE) 450 tablet 3   Vitamin D , Ergocalciferol , (DRISDOL ) 1.25 MG (50000 UNIT) CAPS capsule Take 1 capsule (50,000 Units total) by mouth every 7 (seven) days. 12 capsule 3   [START ON 10/09/2024] zolpidem  (AMBIEN ) 5 MG tablet Take 1 tablet (5 mg total) by mouth at bedtime as needed for sleep (please allow to fill every 30 days). 30 tablet 5   No current facility-administered medications for this encounter.   Allergies:   Gabapentin and Latex   Social History:  The patient  reports that she quit smoking about 9 years ago. Her smoking use included cigarettes. She started smoking about 26 years ago. She has a 4.3 pack-year smoking history. She has never used smokeless tobacco. She reports current drug use. Drug: Cocaine. She reports that she does not drink alcohol.   Family History:  The patient's family history includes Asthma in her sister; Breast cancer in her maternal grandmother; Clotting disorder in her daughter; Diabetes in her mother; Emphysema in her father; Hyperlipidemia in her father and mother; Hypertension in her daughter, father, and mother; Other in her mother; Ovarian cancer in her maternal aunt.   ROS:  Please see the history of present illness.   All other systems are personally reviewed and negative.   Wt Readings from Last 3 Encounters:  09/21/24 83 kg (183 lb)  09/17/24 83.6 kg (184 lb 4 oz)  09/10/24 82.6 kg (182 lb)   BP 118/62   Pulse 62   Ht 5' 7 (1.702 m)   Wt 83 kg (183 lb)   SpO2 93% Comment: 3 Ls of o2  BMI 28.66 kg/m    Physical Exam:   General:  NAD. No resp difficulty, walked into clinic on oxygen  HEENT: Normal Neck: Supple. No JVD. Cor: Regular rate & rhythm. No rubs, gallops or murmurs. Lungs: Diminished Abdomen: Soft, nontender, nondistended.  Extremities: No cyanosis, clubbing,  rash, edema Neuro: Alert & oriented x 3, moves all 4 extremities w/o difficulty. Affect pleasant.  ASSESSMENT AND PLAN: 1. Pulmonary arterial hypertension with RV failure: Severe on 5/16 RHC. 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  There was concern from CTA chest for sarcoidosis, but lymph node biopsy was negative.  ILD is likely related to rheumatoid arthritis. LFTs normal, ANA negative, anti-RNP negative, HIV negative.  V/Q scan negative for chronic PE.  Small PFO but no ASD on TEE. No OSA on sleep study.  She did not tolerate Revatio  due to joint pain (now resolved), and she did not tolerate Adcirca  with worsening dyspnea. She does not tolerate more than 200 mcg bid Selexipag  due to pelvic pain.   RHCs in 7/18 and 5/19 showed well-compensated pulmonary hypertension.  PVR was 1.58 WU on 5/19 study. Echo in 11/21 showed EF 55-60% with normal RV, PASP 30, normal IVC.  Echo in 4/24 showed EF 65-70%, mildly D-shaped septum, mild RV dilation with normal RV systolic function, PASP 51 mmHg, normal IVC.  RHC in 4/24 showed much improved PA pressure 39/17, PVR 2.5 WU.  Echo 6/25 showed EF 65-70%, GIDD, RV normal, RVSP 29 mmhg She also has ILD and COPD that contribute to her dyspnea.  She is not volume overloaded on exam.  NYHA class II symptoms. - She declines today. - With improved RHC and mild PH on 4/24 RHC, we elected not to start sotatercept.  - Continue oxygen .  - Open lung biopsy deemed too risky, probably not sarcoid per pulmonary evaluation => suspect ILD related to RA.    - Continue Opsumit  10 mg daily.  - Continue Adempas  2.5 mg tid.    - She has tolerated selexipag  200 mcg bid, but has not been able to increase.  - Continue torsemide  60 qam/40 qpm and KCl  40 qam/20 qpm.  BMET/BNP today.   2. Chronic hypoxemic respiratory failure: Chronic interstitial lung disease + emphysema.  Concern for sarcoidosis but lymph node biopsy did not show sarcoid. Deemed too high risk for open lung biopsy.  Per pulmonary, she is in the fibrotic phase of NSIP.  She has been diagnosed with RA, this may be the source of her ILD.  - Continue pulmonary followup, sees Dr. Annella.  3. Hyperlipidemia: Continue pravastatin  40 mg daily. Check lipids today. 4. Rheumatoid arthritis: On azathioprine , follows with Dr. Sydney.  5. Palpitations: Occasional PVCs on 2/24 Zio monitor.  - NSR on ECG today.  Follow up in 4 months with Dr. Rolan  Signed, Harlene CHRISTELLA Gainer, FNP  09/21/2024  [facility] [place] [address] [facility] [place] [address] [phone] (office) [phone] (fax)

## 2024-09-21 NOTE — Patient Instructions (Signed)
Medication Changes:  No Changes In Medications at this time.   Lab Work:  Labs done today, your results will be available in MyChart, we will contact you for abnormal readings.   Follow-Up in: 4 months with [name] PLEASE CALL OUR OFFICE AROUND DECEMBER TO GET SCHEDULED FOR YOUR APPOINTMENT. PHONE NUMBER IS [phone] OPTION 2   At [facility], you and your health needs are our priority. We have a designated team specialized in the treatment of Heart Failure. This Care Team includes your primary Heart Failure Specialized Cardiologist (physician), Advanced Practice Providers (APPs- Physician Assistants and Nurse Practitioners), and Pharmacist who all work together to provide you with the care you need, when you need it.   You may see any of the following providers on your designated Care Team at your next follow up:  Dr. Arvilla Meres Dr. [name] Dr. Dorthula Nettles Dr. Theresia Bough Tonye Becket, NP Robbie Lis, [place] [facility] [place], [place] Brynda Peon, NP [place] Lee, NP Karle Plumber, PharmD   Please be sure to bring in all your medications bottles to every appointment.   Need to Contact us:  If you have any questions or concerns before your next appointment please send [place] a message through [place] or call our office at [phone].    TO LEAVE A MESSAGE FOR THE NURSE SELECT OPTION 2, PLEASE LEAVE A MESSAGE INCLUDING: YOUR NAME DATE OF BIRTH CALL BACK NUMBER REASON FOR CALL**this is important as we prioritize the call backs  YOU WILL RECEIVE A CALL BACK THE SAME DAY AS LONG AS YOU CALL BEFORE 4:00 PM

## 2024-09-21 NOTE — Addendum Note (Signed)
 Encounter addended by: Glena Harlene HERO, FNP on: 09/21/2024 12:17 PM  Actions taken: Clinical Note Signed

## 2024-10-04 MED ORDER — PRAVASTATIN SODIUM 40 MG PO TABS: 40.0000 mg | ORAL_TABLET | Freq: Every day | ORAL | 11 refills | Status: AC

## 2024-10-12 DIAGNOSIS — J449 Chronic obstructive pulmonary disease, unspecified: Secondary | ICD-10-CM | POA: Diagnosis not present

## 2024-10-18 DIAGNOSIS — M25561 Pain in right knee: Secondary | ICD-10-CM | POA: Diagnosis not present

## 2024-10-18 DIAGNOSIS — G8929 Other chronic pain: Secondary | ICD-10-CM | POA: Diagnosis not present

## 2024-10-18 DIAGNOSIS — N1831 Chronic kidney disease, stage 3a: Secondary | ICD-10-CM | POA: Diagnosis not present

## 2024-10-18 DIAGNOSIS — F32A Depression, unspecified: Secondary | ICD-10-CM | POA: Diagnosis not present

## 2024-10-18 DIAGNOSIS — M25562 Pain in left knee: Secondary | ICD-10-CM | POA: Diagnosis not present

## 2024-10-18 DIAGNOSIS — J449 Chronic obstructive pulmonary disease, unspecified: Secondary | ICD-10-CM | POA: Diagnosis not present

## 2024-10-18 DIAGNOSIS — Z79899 Other long term (current) drug therapy: Secondary | ICD-10-CM | POA: Diagnosis not present

## 2024-10-18 DIAGNOSIS — Z9981 Dependence on supplemental oxygen: Secondary | ICD-10-CM | POA: Diagnosis not present

## 2024-11-04 ENCOUNTER — Other Ambulatory Visit: Payer: Self-pay | Admitting: Pulmonary Disease

## 2024-11-10 DIAGNOSIS — H5015 Alternating exotropia: Secondary | ICD-10-CM | POA: Diagnosis not present

## 2024-11-10 DIAGNOSIS — H25813 Combined forms of age-related cataract, bilateral: Secondary | ICD-10-CM | POA: Diagnosis not present

## 2024-11-10 DIAGNOSIS — H04123 Dry eye syndrome of bilateral lacrimal glands: Secondary | ICD-10-CM | POA: Diagnosis not present

## 2024-11-10 DIAGNOSIS — H524 Presbyopia: Secondary | ICD-10-CM | POA: Diagnosis not present

## 2024-11-19 ENCOUNTER — Ambulatory Visit (HOSPITAL_COMMUNITY)

## 2024-12-03 ENCOUNTER — Telehealth: Payer: Self-pay

## 2024-12-03 ENCOUNTER — Telehealth: Payer: Self-pay | Admitting: Family Medicine

## 2024-12-03 ENCOUNTER — Other Ambulatory Visit: Payer: Self-pay

## 2024-12-03 DIAGNOSIS — J3089 Other allergic rhinitis: Secondary | ICD-10-CM

## 2024-12-03 MED ORDER — LORATADINE 10 MG PO TABS: 10.0000 mg | ORAL_TABLET | Freq: Every day | ORAL | 0 refills | Status: AC

## 2024-12-03 NOTE — Telephone Encounter (Signed)
" °  Prescription Request  12/03/2024  Is this a Controlled Substance medicine? NO  Have you seen your PCP in the last 2 weeks? SEEN OCT  If YES, route message to pool  -  If NO, patient needs to be scheduled for appointment.  What is the name of the medication or equipment? CLAIRTIN  Have you contacted your pharmacy to request a refill? NO   Which pharmacy would you like this sent to? [facility] [place]   Patient notified that their request is being sent to the clinical staff for review and that they should receive a response within 2 business days.    "

## 2024-12-03 NOTE — Telephone Encounter (Signed)
 Copied from CRM [id number]. Topic: Clinical - Medication Question >> Dec 01, 2024 11:58 AM Gustabo D wrote: Pt is wanting a call back about her allergy medicine. She doesn't know the name.

## 2024-12-03 NOTE — Telephone Encounter (Signed)
 Refill sent

## 2024-12-06 NOTE — Telephone Encounter (Signed)
 Already completed. LS

## 2024-12-24 ENCOUNTER — Other Ambulatory Visit: Payer: Self-pay | Admitting: Pulmonary Disease

## 2024-12-24 DIAGNOSIS — J9611 Chronic respiratory failure with hypoxia: Secondary | ICD-10-CM

## 2024-12-24 MED ORDER — ALBUTEROL SULFATE HFA 108 (90 BASE) MCG/ACT IN AERS: 2.0000 | INHALATION_SPRAY | RESPIRATORY_TRACT | 3 refills | Status: AC | PRN

## 2024-12-24 NOTE — Telephone Encounter (Signed)
 Copied from CRM [id number]. Topic: Clinical - Medication Refill >> Dec 24, 2024  9:55 AM Corean SAUNDERS wrote: Medication: albuterol  (VENTOLIN  HFA) 108 (90 Base) MCG/ACT inhaler   Has the patient contacted their pharmacy? Yes, out of refills.  (Agent: If no, request that the patient contact the pharmacy for the refill. If patient does not wish to contact the pharmacy document the reason why and proceed with request.) (Agent: If yes, when and what did the pharmacy advise?)  This is the patient's preferred pharmacy:   [phone] [place], [place] - [address] [place] [address] Phone: [phone] Fax: [phone]    Is this the correct pharmacy for this prescription? Yes If no, delete pharmacy and type the correct one.   Has the prescription been filled recently? Yes  Is the patient out of the medication? No  Has the patient been seen for an appointment in the last year OR does the patient have an upcoming appointment? Yes  Can we respond through MyChart? No  Agent: Please be advised that Rx refills may take up to 3 business days. We ask that you follow-up with your pharmacy.

## 2024-12-26 ENCOUNTER — Other Ambulatory Visit: Payer: Self-pay | Admitting: Pulmonary Disease

## 2025-01-07 ENCOUNTER — Other Ambulatory Visit (HOSPITAL_COMMUNITY)
Admission: RE | Admit: 2025-01-07 | Discharge: 2025-01-07 | Disposition: A | Source: Ambulatory Visit | Attending: Nephrology | Admitting: Nephrology

## 2025-01-07 ENCOUNTER — Other Ambulatory Visit (HOSPITAL_COMMUNITY): Payer: Self-pay

## 2025-01-07 DIAGNOSIS — E785 Hyperlipidemia, unspecified: Secondary | ICD-10-CM | POA: Diagnosis present

## 2025-01-07 LAB — CBC
HCT: 36 % (ref 36.0–46.0)
Hemoglobin: 11.6 g/dL — ABNORMAL LOW (ref 12.0–15.0)
MCH: 29.7 pg (ref 26.0–34.0)
MCHC: 32.2 g/dL (ref 30.0–36.0)
MCV: 92.3 fL (ref 80.0–100.0)
Platelets: 194 10*3/uL (ref 150–400)
RBC: 3.9 MIL/uL (ref 3.87–5.11)
RDW: 14 % (ref 11.5–15.5)
WBC: 5.2 10*3/uL (ref 4.0–10.5)
nRBC: 0 % (ref 0.0–0.2)

## 2025-01-07 LAB — RENAL FUNCTION PANEL
Albumin: 4.2 g/dL (ref 3.5–5.0)
Anion gap: 14 (ref 5–15)
BUN: 21 mg/dL (ref 8–23)
CO2: 26 mmol/L (ref 22–32)
Calcium: 9.3 mg/dL (ref 8.9–10.3)
Chloride: 98 mmol/L (ref 98–111)
Creatinine, Ser: 1.22 mg/dL — ABNORMAL HIGH (ref 0.44–1.00)
GFR, Estimated: 48 mL/min — ABNORMAL LOW
Glucose, Bld: 124 mg/dL — ABNORMAL HIGH (ref 70–99)
Phosphorus: 3.6 mg/dL (ref 2.5–4.6)
Potassium: 3.9 mmol/L (ref 3.5–5.1)
Sodium: 138 mmol/L (ref 135–145)

## 2025-01-08 LAB — MISC LABCORP TEST (SEND OUT): Labcorp test code: 121265

## 2025-01-08 LAB — MICROALBUMIN / CREATININE URINE RATIO
Creatinine, Urine: 123.9 mg/dL
Microalb Creat Ratio: 10 mg/g{creat} (ref 0–29)
Microalb, Ur: 12.5 ug/mL — ABNORMAL HIGH

## 2025-01-08 LAB — DHEA-SULFATE: DHEA-SO4: 13.7 ug/dL — ABNORMAL LOW (ref 20.4–186.6)

## 2025-01-10 ENCOUNTER — Other Ambulatory Visit (HOSPITAL_COMMUNITY): Payer: Self-pay

## 2025-01-10 ENCOUNTER — Telehealth (HOSPITAL_COMMUNITY): Payer: Self-pay

## 2025-02-02 ENCOUNTER — Encounter

## 2025-03-18 ENCOUNTER — Ambulatory Visit: Payer: Self-pay | Admitting: Family Medicine

## 2025-09-13 ENCOUNTER — Ambulatory Visit: Payer: Self-pay
